# Patient Record
Sex: Female | Born: 1960 | Race: Black or African American | Hispanic: No | Marital: Single | Smoking: Never smoker
Health system: Southern US, Community
[De-identification: ages and names within clinical notes are randomized; demographics above are authoritative.]

## PROBLEM LIST (undated history)

## (undated) ENCOUNTER — Emergency Department (HOSPITAL_BASED_OUTPATIENT_CLINIC_OR_DEPARTMENT_OTHER): Payer: BC Managed Care – PPO | Source: Home / Self Care

## (undated) DIAGNOSIS — I10 Essential (primary) hypertension: Secondary | ICD-10-CM

## (undated) HISTORY — PX: ANKLE SURGERY: SHX546

## (undated) HISTORY — DX: Essential (primary) hypertension: I10

---

## 2000-03-19 ENCOUNTER — Other Ambulatory Visit: Admission: RE | Admit: 2000-03-19 | Discharge: 2000-03-19 | Payer: Self-pay | Admitting: *Deleted

## 2001-05-10 ENCOUNTER — Other Ambulatory Visit: Admission: RE | Admit: 2001-05-10 | Discharge: 2001-05-10 | Payer: Self-pay | Admitting: *Deleted

## 2001-12-22 ENCOUNTER — Ambulatory Visit (HOSPITAL_COMMUNITY): Admission: RE | Admit: 2001-12-22 | Discharge: 2001-12-22 | Payer: Self-pay | Admitting: Internal Medicine

## 2015-02-06 ENCOUNTER — Emergency Department (HOSPITAL_COMMUNITY)
Admission: EM | Admit: 2015-02-06 | Discharge: 2015-02-06 | Disposition: A | Discharge status: Home / Self Care | Payer: BC Managed Care – PPO | Attending: Emergency Medicine | Admitting: Emergency Medicine

## 2015-02-06 ENCOUNTER — Emergency Department (HOSPITAL_COMMUNITY): Payer: BC Managed Care – PPO

## 2015-02-06 ENCOUNTER — Encounter (HOSPITAL_COMMUNITY): Payer: Self-pay | Admitting: Emergency Medicine

## 2015-02-06 DIAGNOSIS — Y998 Other external cause status: Secondary | ICD-10-CM | POA: Diagnosis not present

## 2015-02-06 DIAGNOSIS — Y9301 Activity, walking, marching and hiking: Secondary | ICD-10-CM | POA: Insufficient documentation

## 2015-02-06 DIAGNOSIS — X58XXXA Exposure to other specified factors, initial encounter: Secondary | ICD-10-CM | POA: Insufficient documentation

## 2015-02-06 DIAGNOSIS — Y9389 Activity, other specified: Secondary | ICD-10-CM | POA: Insufficient documentation

## 2015-02-06 DIAGNOSIS — Y9289 Other specified places as the place of occurrence of the external cause: Secondary | ICD-10-CM | POA: Diagnosis not present

## 2015-02-06 DIAGNOSIS — S8261XA Displaced fracture of lateral malleolus of right fibula, initial encounter for closed fracture: Secondary | ICD-10-CM | POA: Diagnosis not present

## 2015-02-06 DIAGNOSIS — S99911A Unspecified injury of right ankle, initial encounter: Secondary | ICD-10-CM | POA: Diagnosis present

## 2015-02-06 DIAGNOSIS — S82891A Other fracture of right lower leg, initial encounter for closed fracture: Secondary | ICD-10-CM

## 2015-02-06 MED ORDER — MORPHINE SULFATE 4 MG/ML IJ SOLN
4.0000 mg | Freq: Once | INTRAMUSCULAR | Status: AC
  Administered 2015-02-06: 4 mg via INTRAMUSCULAR
  Filled 2015-02-06: qty 1

## 2015-02-06 MED ORDER — IBUPROFEN 600 MG PO TABS: 600.0000 mg | ORAL_TABLET | Freq: Four times a day (QID) | ORAL | Status: DC | PRN

## 2015-02-06 MED ORDER — HYDROCODONE-ACETAMINOPHEN 5-325 MG PO TABS: 1.0000 | ORAL_TABLET | ORAL | Status: DC | PRN

## 2015-02-06 MED ORDER — BACITRACIN ZINC 500 UNIT/GM EX OINT
TOPICAL_OINTMENT | CUTANEOUS | Status: AC
  Administered 2015-02-06: 19:00:00
  Filled 2015-02-06: qty 0.9

## 2015-02-06 NOTE — ED Notes (Signed)
Pt complaint of right ankle pain post fall; swelling/deformity noted; right pedal pulse present.

## 2015-02-06 NOTE — Progress Notes (Addendum)
54 yr old bcbs pt with right ankle injury while walking today Right ankle swollen with red streaks noted posteriorly No pcp noted Pt confirmed with ED CM that she does not have a pcp Pt had a visitor to come in when Cm was leaving pt room   Pt offered a list of pcps and orthopedics MDs within 5 mile radius of pt zip code to assist with pcp and orthopedic f/u   Pt awaiting her mother's arrival to assist with the name of the orthopedic provider her mother previously received services from

## 2015-02-06 NOTE — ED Notes (Signed)
Bed: VN50 Expected date:  Expected time:  Means of arrival:  Comments: Ankle deformity

## 2015-02-06 NOTE — ED Provider Notes (Signed)
CSN: [id number]     Arrival date & time 02/06/15  1615 History   First MD Initiated Contact with Patient 02/06/15 1621     Chief Complaint  Patient presents with  . Ankle Injury   HPI   54 year old female presents with right ankle pain. Patient reports that she was walking today when she felt a sharp pain in her right ankle, and was immediately unable to walk as she felt as if it was "out of place". Patient reports that she was not moving at high speeds, turning, no significant mechanism of injury. She reports that she has no prior history of injury to the ankle. She reports minimal pain at time of evaluation, he has been using ice, and cannot ambulate. Patient reports she is otherwise healthy with no concerns in addition to the ankle pain.   History reviewed. No pertinent past medical history. History reviewed. No pertinent past surgical history. No family history on file. History  Substance Use Topics  . Smoking status: Never Smoker   . Smokeless tobacco: Not on file  . Alcohol Use: No   OB History    No data available     Review of Systems  All other systems reviewed and are negative.   Allergies  Review of patient's allergies indicates no known allergies.  Home Medications   Prior to Admission medications   Not on File   BP 120/80 mmHg  Pulse 73  Temp(Src) 97.9 F (36.6 C) (Oral)  Resp 19  SpO2 99% Physical Exam  Constitutional: She is oriented to person, place, and time. She appears well-developed and well-nourished.  HENT:  Head: Normocephalic and atraumatic.  Eyes: Conjunctivae are normal. Pupils are equal, round, and reactive to light. Right eye exhibits no discharge. Left eye exhibits no discharge. No scleral icterus.  Neck: Normal range of motion. No JVD present. No tracheal deviation present.  Cardiovascular: Normal rate and regular rhythm.   Pulmonary/Chest: Effort normal. No stridor.  Musculoskeletal:  No pain to the palpation of the knee, proximal  fibula. Patient has pain to palpation of medial malleolus/ankle joint with obvious laxity, no skin tinting or soft tissue damage. Hematoma to the ankle. Foot is nontender to palpation, sensation intact, distal pulse Refill normal. Limited dorsiflexion of the ankle.  Neurological: She is alert and oriented to person, place, and time. Coordination normal.  Psychiatric: She has a normal mood and affect. Her behavior is normal. Judgment and thought content normal.  Nursing note and vitals reviewed.   ED Course  Procedures (including critical care time) Labs Review Labs Reviewed - No data to display  Imaging Review Dg Ankle Complete Right  02/06/2015   CLINICAL DATA:  Felipa Evener and felt a snap in her RIGHT ankle while she was walking downtown, did not fall, swelling, ankle injury  EXAM: RIGHT ANKLE - COMPLETE 3+ VIEW  COMPARISON:  None  FINDINGS: Bones demineralized.  Transverse comminuted fracture the medial malleolus, displaced laterally.  Oblique distal RIGHT fibular metadiaphyseal fracture displaced posteriorly and laterally.  Mild lateral subluxation of talus at tibiotalar joint.  Small bone fragment is seen on the lateral view superimposed with the posterior margin of the distal tibia, cannot exclude posterior tibial fracture.  Tarsals intact.  Regional soft tissue swelling.  IMPRESSION: Displaced RIGHT medial and lateral malleolar fractures as above.  Questionable fracture of the distal posterior tibia.   Electronically Signed   By: Lavonia Dana M.D.   On: 02/06/2015 17:44     EKG Interpretation None  MDM   Final diagnoses:  Ankle fracture, right, closed, initial encounter   Labs: None indicated  Imaging: DG ankle right complete displaced right medial and lateral malleolus fracture  Consults: Orthopedics Dr. Rhona Raider  Therapeutics: Morphine  Assessment/Plan: Patient presents with a place right medial and lateral malleoli fracture with question fracture of the distal posterior tibia.  Patient was in minimal pain, at rest but required morphine for splint placement . Dr. Milas Hock was consult and who instructed [place] to apply a splint with follow-up tomorrow morning. Patient was informed that she needed to follow-up for possible surgical management. She is instructed to not eat anything after midnight in the event surgery be performed tomorrow. Patient was given return precautions and the event that she experienced worsening signs or symptoms including compartment syndrome, these were discussed in detail with her. Patient's mother was present at the time of evaluation she was given the same precautions and follow-up information. Patient had no further questions at the time of discharge. She was instructed to use crutches avoid walking point weight on the affected ankle. She is advised to use ibuprofen, and Vicodin as needed for pain. Patient was discharged home without difficulty.        Okey Regal, PA-C 02/07/15 0214  Lacretia Leigh, MD 02/11/15 [phone]

## 2015-02-06 NOTE — Discharge Instructions (Signed)
Ankle Fracture A fracture is a break in a bone. A cast or splint may be used to protect the ankle and heal the break. Sometimes, surgery is needed. HOME CARE  Use crutches as told by your doctor. It is very important that you use your crutches correctly.  Do not put weight or pressure on the injured ankle until told by your doctor.  Keep your ankle raised (elevated) when sitting or lying down.  Apply ice to the ankle:  Put ice in a plastic bag.  Place a towel between your cast and the bag.  Leave the ice on for 20 minutes, 2-3 times a day.  If you have a plaster or fiberglass cast:  Do not try to scratch under the cast with any objects.  Check the skin around the cast every day. You may put lotion on red or sore areas.  Keep your cast dry and clean.  If you have a plaster splint:  Wear the splint as told by your doctor.  You can loosen the elastic around the splint if your toes get numb, tingle, or turn cold or blue.  Do not put pressure on any part of your cast or splint. It may break. Rest your plaster splint or cast only on a pillow the first 24 hours until it is fully hardened.  Cover your cast or splint with a plastic bag during showers.  Do not lower your cast or splint into water.  Take medicine as told by your doctor.  Do not drive until your doctor says it is safe.  Follow-up with your doctor as told. It is very important that you go to your follow-up visits. GET HELP IF: The swelling and discomfort gets worse.  GET HELP RIGHT AWAY IF:   Your splint or cast breaks.  You continue to have very bad pain.  You have new pain or swelling after your splint or cast was put on.  Your skin or toes below the injured ankle:  Turn blue or gray.  Feel cold, numb, or you cannot feel them.  There is a bad smell or yellowish white fluid (pus) coming from under the splint or cast. MAKE SURE YOU:   Understand these instructions.  Will watch your  condition.  Will get help right away if you are not doing well or get worse. Document Released: 07/13/2009 Document Revised: 07/06/2013 Document Reviewed: 04/14/2013 [facility] Patient Information 2015 [place], [place]. This information is not intended to replace advice given to you by your health care provider. Make sure you discuss any questions you have with your health care provider.  Please keep extremity elevated, use ibuprofen as needed for pain, hydrocodone for breakthrough pain. Do not walk on your ankle. Please follow-up tomorrow morning with orthopedic surgeon for further evaluation and management. The emergency room if you experience excruciating pain, loss of distal sensation strength function, changes in perfusion of your toes.

## 2015-02-09 ENCOUNTER — Other Ambulatory Visit: Payer: Self-pay | Admitting: Orthopaedic Surgery

## 2015-02-13 ENCOUNTER — Ambulatory Visit (HOSPITAL_COMMUNITY)
Admission: RE | Admit: 2015-02-13 | Payer: BC Managed Care – PPO | Source: Ambulatory Visit | Admitting: Orthopaedic Surgery

## 2015-02-13 ENCOUNTER — Encounter (HOSPITAL_COMMUNITY): Admission: RE | Payer: Self-pay | Source: Ambulatory Visit

## 2015-02-13 SURGERY — OPEN REDUCTION INTERNAL FIXATION (ORIF) ANKLE FRACTURE
Anesthesia: General | Laterality: Right

## 2015-05-18 ENCOUNTER — Ambulatory Visit: Payer: BC Managed Care – PPO | Attending: Orthopaedic Surgery | Admitting: Rehabilitation

## 2015-05-18 DIAGNOSIS — M2141 Flat foot [pes planus] (acquired), right foot: Secondary | ICD-10-CM | POA: Diagnosis present

## 2015-05-18 DIAGNOSIS — M25674 Stiffness of right foot, not elsewhere classified: Secondary | ICD-10-CM | POA: Insufficient documentation

## 2015-05-18 DIAGNOSIS — R29898 Other symptoms and signs involving the musculoskeletal system: Secondary | ICD-10-CM

## 2015-05-18 DIAGNOSIS — R269 Unspecified abnormalities of gait and mobility: Secondary | ICD-10-CM | POA: Insufficient documentation

## 2015-05-18 DIAGNOSIS — M25571 Pain in right ankle and joints of right foot: Secondary | ICD-10-CM | POA: Diagnosis present

## 2015-05-18 NOTE — Therapy (Signed)
[address] [place], [place], [address] Phone: [phone]   Fax:  [phone]  Physical Therapy Evaluation  Patient Details  Name: [name] MRN: [id number] Date of Birth: Apr 24, 1961 Referring Provider:  Marybelle Killings, MD  Encounter Date: 05/18/2015      PT End of Session - 05/18/15 1232    Visit Number 1   PT Start Time 1110   PT Stop Time 1210   PT Time Calculation (min) 60 min      No past medical history on file.  No past surgical history on file.  There were no vitals filed for this visit.  Visit Diagnosis:  Decreased range of motion of foot, right - Plan: PT plan of care cert/re-cert  Weakness of foot, right - Plan: PT plan of care cert/re-cert  Abnormality of gait - Plan: PT plan of care cert/re-cert  Pain in joint, ankle and foot, right - Plan: PT plan of care cert/re-cert      Subjective Assessment - 05/18/15 1115    Subjective R bimalleolar fx 02/06/15.    Patient does not know of any mechanism of injury.   Denies falling.  States she was going to sit down and noticed her ankle was malformed.   Had surgical fixation.   Fu with MD last week and walking boot was d/c'd.   She returns to MD 9/23.    She is still ambulating with crutches and has done so since sx.       Patient Stated Goals walk normally without pain   Currently in Pain? Yes   Pain Score 6    Pain Location Ankle   Pain Orientation Right   Pain Descriptors / Indicators Other (Comment);Aching   Pain Type Surgical pain   Pain Onset More than a month ago   Pain Frequency Constant   Aggravating Factors  WB and stretching   Pain Relieving Factors sitting and NWB positions   Effect of Pain on Daily Activities unable to do most housework due to pain;  retired,  very active in community volunteering that she is unable to do at this time            [facility] PT Assessment - 05/18/15 0001    Assessment   Medical Diagnosis  bi malleolar ankle Fx RLE   Onset Date/Surgical Date 02/18/15   Hand Dominance Right   Next MD Visit 9/23   Prior Therapy no   Precautions   Precautions None   Restrictions   Weight Bearing Restrictions No   Balance Screen   Has the patient fallen in the past 6 months No   Has the patient had a decrease in activity level because of a fear of falling?  No   Is the patient reluctant to leave their home because of a fear of falling?  No   Home Environment   Additional Comments 2 story home with bedroom upstairs   Prior Function   Level of Independence Independent   ROM / Strength   AROM / PROM / Strength PROM;Strength   AROM   Right Ankle Dorsiflexion -20   Right Ankle Plantar Flexion 25   Right Ankle Inversion 8   Right Ankle Eversion 2   PROM   PROM Assessment Site Ankle   Right/Left Ankle Right   Strength   Strength Assessment Site Ankle   Right/Left Ankle Right   Right Ankle Dorsiflexion 3/5   Right Ankle Plantar Flexion  2/5   Right Ankle Inversion 2/5   Right Ankle Eversion 2/5   Ambulation/Gait   Ambulation/Gait Yes   Ambulation/Gait Assistance 7: Independent   Ambulation Distance (Feet) 75 Feet  250 feet   Assistive device Crutches                   [facility] Adult PT Treatment/Exercise - 05/18/15 0001    Exercises   Exercises Ankle   Modalities   Modalities Cryotherapy;Electrical Stimulation   Cryotherapy   Number Minutes Cryotherapy 15 Minutes   Cryotherapy Location Ankle   Type of Cryotherapy Ice pack   Electrical Stimulation   Electrical Stimulation Location R ankle   Electrical Stimulation Action premod to tolerance   Electrical Stimulation Goals Pain   Ankle Exercises: Aerobic   Elliptical Nustep x 6' L5                PT Education - 05/18/15 1229    Education provided Yes   Education Details towel DF stretch, towel DF with inversion twist stretch and eversion twist stretch;   seated cross legged manual PF self stretch    Person(s) Educated Patient   Methods Explanation;Demonstration;Tactile cues;Verbal cues   Comprehension Need further instruction          PT Short Term Goals - 05/18/15 1237    PT SHORT TERM GOAL #1   Title independent HEP   Time 4   Period Weeks   Status New   PT SHORT TERM GOAL #2   Title 2/10 pain worst   Time 4   Period Weeks   PT SHORT TERM GOAL #3   Title Gait with 1 crutch or cane independently to 400'   Time 4   Period Weeks   Status New           PT Long Term Goals - 05/18/15 1238    PT LONG TERM GOAL #1   Title 0/10 pain most of time   Time 8   Period Weeks   Status New   PT LONG TERM GOAL #2   Title R ankle DF to neutral, 20 deg INV, 10 deg EV   Time 8   Period Weeks   Status New   PT LONG TERM GOAL #3   Title Gait without device with normal gait pattern, no limp with good balance up to 500' and up/down stairs with reciprocal gait pattern   Time 8   Period Weeks   Status New               Plan - 05/18/15 1232    Clinical Impression Statement Pt. 3 months s/p bimalleolar ankle fixation with very restricted ROM, difficulty walking due to pain and stiffness R ankle.    Has fully healed incisions medial and lateral R ankle with keloid scar laterally.    Considerable edema throughout the foot and ankle as well.  Dependent on bilateral crutches with gait   Pt will benefit from skilled therapeutic intervention in order to improve on the following deficits Abnormal gait;Decreased range of motion;Decreased mobility;Difficulty walking;Increased edema;Impaired flexibility;Decreased strength;Pain   Rehab Potential Excellent   PT Frequency 3x / week   PT Duration 8 weeks   PT Treatment/Interventions Moist Heat;Ultrasound;Electrical Stimulation;Cryotherapy;Gait training;DME Instruction;Stair training;Functional mobility training;Therapeutic activities;Therapeutic exercise;Balance training;Patient/family education;Scar mobilization;Passive range of  motion;Manual techniques;Taping   PT Next Visit Plan nustep warm up;   review HEP stretching; do some manual stretching and add exercise as able;    ice/IFC;  scar tissue mob   Consulted and Agree with Plan of Care Patient         Problem List There are no active problems to display for this patient.   Volney American, PT 05/18/2015, 12:44 PM  [address] [place], [place], [address] Phone: [phone]   Fax:  [phone]

## 2015-05-22 ENCOUNTER — Ambulatory Visit: Payer: BC Managed Care – PPO | Admitting: Physical Therapy

## 2015-05-22 ENCOUNTER — Encounter: Payer: Self-pay | Admitting: Physical Therapy

## 2015-05-22 DIAGNOSIS — R29898 Other symptoms and signs involving the musculoskeletal system: Secondary | ICD-10-CM

## 2015-05-22 DIAGNOSIS — M25674 Stiffness of right foot, not elsewhere classified: Secondary | ICD-10-CM

## 2015-05-22 DIAGNOSIS — M25571 Pain in right ankle and joints of right foot: Secondary | ICD-10-CM

## 2015-05-22 DIAGNOSIS — R269 Unspecified abnormalities of gait and mobility: Secondary | ICD-10-CM

## 2015-05-22 NOTE — Therapy (Signed)
[address] [place], [place], [address] Phone: [phone]   Fax:  [phone]  Physical Therapy Treatment  Patient Details  Name: [name] MRN: [id number] Date of Birth: 05/29/1961 Referring Provider:  Marybelle Killings, MD  Encounter Date: 05/22/2015      PT End of Session - 05/22/15 1430    Visit Number 2   Date for PT Re-Evaluation 07/17/15   PT Start Time 6237   PT Stop Time 1438   PT Time Calculation (min) 56 min   Activity Tolerance Patient tolerated treatment well;Patient limited by fatigue;Patient limited by pain   Behavior During Therapy Anxious      History reviewed. No pertinent past medical history.  History reviewed. No pertinent past surgical history.  There were no vitals filed for this visit.  Visit Diagnosis:  Decreased range of motion of foot, right  Weakness of foot, right  Abnormality of gait  Pain in joint, ankle and foot, right      Subjective Assessment - 05/22/15 1344    Subjective Pt reports no new changes since last treatment. Pt also reports compliance with HEP    Patient Stated Goals walk normally without pain   Currently in Pain? Yes   Pain Score 4    Pain Location Ankle   Pain Orientation Right   Pain Descriptors / Indicators Aching   Pain Type Surgical pain                         [facility] Adult PT Treatment/Exercise - 05/22/15 0001    Ambulation/Gait   Ambulation/Gait Yes   Ambulation Distance (Feet) 20 Feet   Assistive device L Axillary Crutch   Gait Pattern Decreased arm swing - right;Decreased arm swing - left;Decreased step length - right;Decreased step length - left;Decreased stance time - right;Step-to pattern   Gait Comments X4 constant cues to put weight on RLE   Exercises   Exercises Ankle, Towel DF stretch 10    Modalities   Modalities Cryotherapy   Cryotherapy   Number Minutes Cryotherapy 10 Minutes   Cryotherapy  Location Ankle   Type of Cryotherapy Ice pack   Manual Therapy   Manual Therapy Passive ROM   Passive ROM R ankle dorsi and planter flexion, eversion,  inversion   Ankle Exercises: Aerobic   Elliptical Nustep x 7' L4   Ankle Exercises: Stretches   Gastroc Stretch 5 reps;20 seconds   Ankle Exercises: Standing   Other Standing Ankle Exercises standing weight shifts 20 reps, pt hesitant max encouragement                   PT Short Term Goals - 05/22/15 1435    PT SHORT TERM GOAL #1   Title independent HEP   Status Partially Met   PT SHORT TERM GOAL #2   Title 2/10 pain worst   Status On-going   PT SHORT TERM GOAL #3   Title Gait with 1 crutch or cane independently to 400'   Status On-going           PT Long Term Goals - 05/18/15 1238    PT LONG TERM GOAL #1   Title 0/10 pain most of time   Time 8   Period Weeks   Status New   PT LONG TERM GOAL #2   Title R ankle DF to neutral, 20 deg INV, 10 deg EV   Time 8  Period Weeks   Status New   PT LONG TERM GOAL #3   Title Gait without device with normal gait pattern, no limp with good balance up to 500' and up/down stairs with reciprocal gait pattern   Time 8   Period Weeks   Status New               Plan - 05/22/15 1431    Clinical Impression Statement Pt L ankle is continues to be very restricted with ROM. Pt resisted to put weight on RLE throughout treatment. Pt requires max encouragement to put bare weight on R ankle. Pt reports that she does not know why she cant put weight on her leg because it does not hurt much. Pt tends to walk with RLE externally rotated and out to the side to keep from putting weight on it.    Pt will benefit from skilled therapeutic intervention in order to improve on the following deficits Abnormal gait;Decreased range of motion;Decreased mobility;Difficulty walking;Increased edema;Impaired flexibility;Decreased strength;Pain   Rehab Potential Good   PT Frequency 3x / week    PT Duration 8 weeks   PT Treatment/Interventions Moist Heat;Ultrasound;Electrical Stimulation;Cryotherapy;Gait training;DME Instruction;Stair training;Functional mobility training;Therapeutic activities;Therapeutic exercise;Balance training;Patient/family education;Scar mobilization;Passive range of motion;Manual techniques;Taping   PT Next Visit Plan Gait and ROM        Problem List There are no active problems to display for this patient.   Scot Jun, PTA  05/22/2015, 2:41 PM  [address] [place], [place], [address] Phone: [phone]   Fax:  [phone]

## 2015-05-23 ENCOUNTER — Encounter: Payer: Self-pay | Admitting: Physical Therapy

## 2015-05-23 ENCOUNTER — Ambulatory Visit: Payer: BC Managed Care – PPO | Admitting: Physical Therapy

## 2015-05-23 DIAGNOSIS — M25674 Stiffness of right foot, not elsewhere classified: Secondary | ICD-10-CM | POA: Diagnosis not present

## 2015-05-23 DIAGNOSIS — R269 Unspecified abnormalities of gait and mobility: Secondary | ICD-10-CM

## 2015-05-23 DIAGNOSIS — M25571 Pain in right ankle and joints of right foot: Secondary | ICD-10-CM

## 2015-05-23 DIAGNOSIS — R29898 Other symptoms and signs involving the musculoskeletal system: Secondary | ICD-10-CM

## 2015-05-23 NOTE — Therapy (Signed)
[place] [place], [place], [address] Phone: [phone]   Fax:  [phone]  Physical Therapy Treatment  Patient Details  Name: Navayah Sok MRN: [id number] Date of Birth: [birth year] Referring Provider:  Marybelle Killings, MD  Encounter Date: 05/23/2015      PT End of Session - 05/23/15 1149    Visit Number 2   Date for PT Re-Evaluation 07/17/15   PT Start Time 1100   PT Stop Time 1153   PT Time Calculation (min) 53 min   Activity Tolerance Patient tolerated treatment well;Patient limited by pain   Behavior During Therapy Anxious      History reviewed. No pertinent past medical history.  History reviewed. No pertinent past surgical history.  There were no vitals filed for this visit.  Visit Diagnosis:  Abnormality of gait  Weakness of foot, right  Decreased range of motion of foot, right  Pain in joint, ankle and foot, right      Subjective Assessment - 05/23/15 1103    Subjective Pt entered clinics ambulating with bilat crutches, bearing more weight in RLE.   Currently in Pain? No/denies   Pain Score 0-No pain   Pain Location Ankle   Pain Orientation Right                         [facility] Adult PT Treatment/Exercise - 05/23/15 0001    Ambulation/Gait   Ambulation/Gait Yes   Ambulation Distance (Feet) 20 Feet   Assistive device L Axillary Crutch   Gait Pattern Decreased arm swing - right;Decreased arm swing - left;Decreased step length - right;Decreased step length - left;Decreased stance time - right;Step-to pattern   Gait Comments X3 constant cues to put weight on RLE   Exercises   Exercises Ankle   Modalities   Modalities Cryotherapy   Cryotherapy   Number Minutes Cryotherapy 10 Minutes   Cryotherapy Location Ankle   Type of Cryotherapy Ice pack   Manual Therapy   Manual Therapy Passive ROM  incision  mobilization   Passive ROM R ankle dorsi and plantar  flexion, eversion,  inversion   Ankle Exercises: Aerobic   Elliptical Nustep x 7' L4   Ankle Exercises: Standing   Other Standing Ankle Exercises weights shift 50, cues to shift hips    Other Standing Ankle Exercises SLS RLE, 10 reps 5 seconds                 PT Education - 05/23/15 1149    Education provided Yes   Education Details scar mobilization   Person(s) Educated Patient   Methods Explanation;Demonstration;Tactile cues   Comprehension Verbalized understanding;Returned demonstration          PT Short Term Goals - 05/23/15 1156    PT SHORT TERM GOAL #1   Title independent HEP   Status Achieved   PT SHORT TERM GOAL #3   Title Gait with 1 crutch or cane independently to 400'   Status On-going           PT Long Term Goals - 05/18/15 1238    PT LONG TERM GOAL #1   Title 0/10 pain most of time   Time 8   Period Weeks   Status New   PT LONG TERM GOAL #2   Title R ankle DF to neutral, 20 deg INV, 10 deg EV   Time 8   Period Weeks   Status New  PT LONG TERM GOAL #3   Title Gait without device with normal gait pattern, no limp with good balance up to 500' and up/down stairs with reciprocal gait pattern   Time 8   Period Weeks   Status New               Plan - 05/23/15 1150    Clinical Impression Statement Restricted R ankle ROM. Pt requires constant cues with weight shift to her R side. Pt very guarded to bare weight on R ankle. Ample amount of time spent explaining gait cycle to pt with crutch.    Pt will benefit from skilled therapeutic intervention in order to improve on the following deficits Abnormal gait;Decreased range of motion;Decreased mobility;Difficulty walking;Increased edema;Impaired flexibility;Decreased strength;Pain   Rehab Potential Fair   PT Frequency 3x / week   PT Duration 8 weeks   PT Treatment/Interventions Moist Heat;Ultrasound;Electrical Stimulation;Cryotherapy;Gait training;DME Instruction;Stair training;Functional  mobility training;Therapeutic activities;Therapeutic exercise;Balance training;Patient/family education;Scar mobilization;Passive range of motion;Manual techniques;Taping   PT Next Visit Plan Gait and ROM        Problem List There are no active problems to display for this patient.   Scot Jun, PTA 05/23/2015, 11:58 AM  [place] [address] [place], [place], [address] Phone: [phone]   Fax:  [phone]

## 2015-05-25 ENCOUNTER — Ambulatory Visit: Payer: BC Managed Care – PPO | Admitting: Physical Therapy

## 2015-05-25 ENCOUNTER — Encounter: Payer: Self-pay | Admitting: Physical Therapy

## 2015-05-25 DIAGNOSIS — M25571 Pain in right ankle and joints of right foot: Secondary | ICD-10-CM

## 2015-05-25 DIAGNOSIS — M25674 Stiffness of right foot, not elsewhere classified: Secondary | ICD-10-CM

## 2015-05-25 DIAGNOSIS — R269 Unspecified abnormalities of gait and mobility: Secondary | ICD-10-CM

## 2015-05-25 DIAGNOSIS — R29898 Other symptoms and signs involving the musculoskeletal system: Secondary | ICD-10-CM

## 2015-05-25 NOTE — Therapy (Signed)
[place], [place], [address] Phone: [phone]   Fax:  [phone]  Physical Therapy Treatment  Patient Details  Name: [name] MRN: [id number] Date of Birth: [birth year] Referring Provider:  Marybelle Killings, MD  Encounter Date: 05/25/2015      PT End of Session - 05/25/15 1155    Visit Number 3   Date for PT Re-Evaluation 07/17/15   PT Start Time 1112   PT Stop Time 1156   PT Time Calculation (min) 44 min   Activity Tolerance Patient tolerated treatment well   Behavior During Therapy [facility] for tasks assessed/performed;Anxious      History reviewed. No pertinent past medical history.  History reviewed. No pertinent past surgical history.  There were no vitals filed for this visit.  Visit Diagnosis:  Abnormality of gait  Weakness of foot, right  Decreased range of motion of foot, right  Pain in joint, ankle and foot, right                       [facility] Adult PT Treatment/Exercise - 05/25/15 0001    Ambulation/Gait   Ambulation Distance (Feet) 70 Feet   Assistive device L Axillary Crutch   Gait Comments did the above multiple times 12 times, a lot of cues verbal, visual and tactile   Manual Therapy   Manual Therapy Passive ROM   Passive ROM R ankle dorsi and plantar flexion, eversion,  inversion   Ankle Exercises: Aerobic   Elliptical Elliptical R=6 I = 8 x 5 minutes   Tread Mill 1.3 mph x 4 minutes working on weight shift, again a lot of cues are required   Ankle Exercises: Stretches   Soleus Stretch 20 seconds;5 reps   Gastroc Stretch 5 reps;20 seconds   Ankle Exercises: Standing   BAPS 15 reps  used sit fit   Other Standing Ankle Exercises weights shifte 50, cues to shift hips    Other Standing Ankle Exercises SLS RLE, 10 reps 5 seconds   worked on pre gait weight shifting                  PT Short Term Goals - 05/23/15 1156    PT SHORT TERM  GOAL #1   Title independent HEP   Status Achieved   PT SHORT TERM GOAL #3   Title Gait with 1 crutch or cane independently to 400'   Status On-going           PT Long Term Goals - 05/18/15 1238    PT LONG TERM GOAL #1   Title 0/10 pain most of time   Time 8   Period Weeks   Status New   PT LONG TERM GOAL #2   Title R ankle DF to neutral, 20 deg INV, 10 deg EV   Time 8   Period Weeks   Status New   PT LONG TERM GOAL #3   Title Gait without device with normal gait pattern, no limp with good balance up to 500' and up/down stairs with reciprocal gait pattern   Time 8   Period Weeks   Status New               Plan - 05/25/15 1156    Clinical Impression Statement Tremendously poor gait, needs multiple and constant cues, verbal, tactile and visual to help with all of the compensation that she is doing.   PT  Next Visit Plan Gait and ROM   Consulted and Agree with Plan of Care Patient        Problem List There are no active problems to display for this patient.   Sumner Boast., PT 05/25/2015, 11:58 AM  [address] [place], [place], [address] Phone: [phone]   Fax:  [phone]

## 2015-05-28 ENCOUNTER — Ambulatory Visit: Payer: BC Managed Care – PPO | Admitting: Physical Therapy

## 2015-05-28 ENCOUNTER — Encounter: Payer: Self-pay | Admitting: Physical Therapy

## 2015-05-28 DIAGNOSIS — M25571 Pain in right ankle and joints of right foot: Secondary | ICD-10-CM

## 2015-05-28 DIAGNOSIS — M25674 Stiffness of right foot, not elsewhere classified: Secondary | ICD-10-CM

## 2015-05-28 DIAGNOSIS — R29898 Other symptoms and signs involving the musculoskeletal system: Secondary | ICD-10-CM

## 2015-05-28 NOTE — Therapy (Signed)
[place], [place], [address] Phone: [phone]   Fax:  [phone]  Physical Therapy Treatment  Patient Details  Name: [name] MRN: [id number] Date of Birth: 15-Feb-1961 Referring Provider:  Marybelle Killings, MD  Encounter Date: 05/28/2015      PT End of Session - 05/28/15 1433    Visit Number 4   PT Start Time 8938   PT Stop Time 1441   PT Time Calculation (min) 53 min   Activity Tolerance Patient tolerated treatment well      History reviewed. No pertinent past medical history.  History reviewed. No pertinent past surgical history.  There were no vitals filed for this visit.  Visit Diagnosis:  Weakness of foot, right  Decreased range of motion of foot, right  Pain in joint, ankle and foot, right      Subjective Assessment - 05/28/15 1350    Subjective Pt enters clinic with [place]. Pt reports that everything has been fine     Currently in Pain? Yes   Pain Score 3    Pain Location Ankle   Pain Orientation Right   Pain Descriptors / Indicators Aching                         [facility] Adult PT Treatment/Exercise - 05/28/15 0001    Ambulation/Gait   Ambulation Distance (Feet) 70 Feet   Assistive device Straight cane   Gait Comments did the above multiple times , a lot of cues verbal, visual and tactile   Cryotherapy   Number Minutes Cryotherapy 10 Minutes   Cryotherapy Location Ankle   Type of Cryotherapy Ice pack   Manual Therapy   Manual Therapy Passive ROM   Passive ROM R ankle dorsi and plantar flexion, eversion,  inversion   Ankle Exercises: Aerobic   Elliptical Elliptical R=3 I = 6 x 5 minutes   Ankle Exercises: Standing   BAPS 15 reps  sit fit    Other Standing Ankle Exercises weights shift 50, cues to shift hips    Other Standing Ankle Exercises SLS RLE, 10 reps 5 seconds   worked on pre gait weight shifting                  PT Short  Term Goals - 05/28/15 1436    PT SHORT TERM GOAL #3   Title Gait with 1 crutch or cane independently to 400'   Status Achieved           PT Long Term Goals - 05/28/15 1436    PT LONG TERM GOAL #1   Title 0/10 pain most of time   Status On-going               Plan - 05/28/15 1434    Clinical Impression Statement Pt requires constant cues to correct gait pattern. Pt still uses compensatory swelling not to bear weight on RLE. Pt requires facilitation to bear weight on RLE, but resist at times    Pt will benefit from skilled therapeutic intervention in order to improve on the following deficits Abnormal gait;Decreased range of motion;Decreased mobility;Difficulty walking;Increased edema;Impaired flexibility;Decreased strength;Pain   Rehab Potential Fair   PT Frequency 3x / week   PT Duration 8 weeks   PT Treatment/Interventions Moist Heat;Ultrasound;Electrical Stimulation;Cryotherapy;Gait training;DME Instruction;Stair training;Functional mobility training;Therapeutic activities;Therapeutic exercise;Balance training;Patient/family education;Scar mobilization;Passive range of motion;Manual techniques;Taping   PT Next Visit Plan Gait and ROM  Problem List There are no active problems to display for this patient.   Scot Jun, PTA  05/28/2015, 2:40 PM  [address] [place], [place], [address] Phone: [phone]   Fax:  [phone]

## 2015-05-30 ENCOUNTER — Ambulatory Visit: Payer: BC Managed Care – PPO | Admitting: Physical Therapy

## 2015-05-31 ENCOUNTER — Encounter: Payer: Self-pay | Admitting: Physical Therapy

## 2015-05-31 ENCOUNTER — Ambulatory Visit: Payer: BC Managed Care – PPO | Attending: Orthopaedic Surgery | Admitting: Physical Therapy

## 2015-05-31 DIAGNOSIS — M2141 Flat foot [pes planus] (acquired), right foot: Secondary | ICD-10-CM | POA: Insufficient documentation

## 2015-05-31 DIAGNOSIS — R269 Unspecified abnormalities of gait and mobility: Secondary | ICD-10-CM | POA: Insufficient documentation

## 2015-05-31 DIAGNOSIS — M25674 Stiffness of right foot, not elsewhere classified: Secondary | ICD-10-CM | POA: Diagnosis present

## 2015-05-31 DIAGNOSIS — R29898 Other symptoms and signs involving the musculoskeletal system: Secondary | ICD-10-CM

## 2015-05-31 DIAGNOSIS — M25571 Pain in right ankle and joints of right foot: Secondary | ICD-10-CM | POA: Diagnosis present

## 2015-05-31 NOTE — Therapy (Signed)
[place], [place], [address] Phone: [phone]   Fax:  [phone]  Physical Therapy Treatment  Patient Details  Name: [name] MRN: [id number] Date of Birth: 01/10/1961 Referring Provider:  Marybelle Killings, MD  Encounter Date: 05/31/2015      PT End of Session - 05/31/15 1437    Visit Number 5   Date for PT Re-Evaluation 07/17/15   PT Start Time 7062   PT Stop Time 1442   PT Time Calculation (min) 53 min   Equipment Utilized During Treatment Other (comment)   Activity Tolerance Patient tolerated treatment well   Behavior During Therapy [facility] for tasks assessed/performed;Anxious      History reviewed. No pertinent past medical history.  History reviewed. No pertinent past surgical history.  There were no vitals filed for this visit.  Visit Diagnosis:  Weakness of foot, right  Decreased range of motion of foot, right  Pain in joint, ankle and foot, right      Subjective Assessment - 05/31/15 1349    Subjective Pt reports 'things are going good,  but Im still walking choppy"   Currently in Pain? No/denies   Pain Score 0-No pain   Pain Location Ankle   Pain Orientation Right                         [facility] Adult PT Treatment/Exercise - 05/31/15 0001    Ambulation/Gait   Ambulation/Gait Yes   Ambulation Distance (Feet) 70 Feet   Assistive device Rollator;None   Gait Pattern Decreased stride length;Decreased step length - left;Decreased stance time - right   Stairs Yes   Stairs Assistance 4: Min guard   Stair Management Technique One rail Right;One rail Left;Two rails;Alternating pattern;Step to pattern;Forwards   Number of Stairs 4   Height of Stairs 6   Gait Comments did the above multiple times , a lot of cues verbal, visual and tactile   Exercises   Exercises Ankle   Modalities   Modalities Cryotherapy   Cryotherapy   Number Minutes Cryotherapy 10  Minutes   Cryotherapy Location Ankle   Type of Cryotherapy Ice pack   Manual Therapy   Manual Therapy Passive ROM   Passive ROM R ankle dorsi and plantar flexion, eversion,  inversion   Ankle Exercises: Aerobic   Elliptical Elliptical R=5 I = 10 x 5 minutes   Tread Mill 1.0 mph x 3 minutes working on weight shift, again a lot of cues are required   Ankle Exercises: Standing   BAPS 15 reps   Vector Stance --  on sit fit   Other Standing Ankle Exercises weights shift 50, cues to shift hips    Other Standing Ankle Exercises SLS RLE, 10 seconds 3 reps  worked on pre gait weight shifting   Ankle Exercises: Stretches   Soleus Stretch 20 seconds;5 reps   Gastroc Stretch 5 reps;20 seconds                  PT Short Term Goals - 05/28/15 1436    PT SHORT TERM GOAL #3   Title Gait with 1 crutch or cane independently to 400'   Status Achieved           PT Long Term Goals - 05/31/15 1441    PT LONG TERM GOAL #3   Title Gait without device with normal gait pattern, no limp with good balance up  to 500' and up/down stairs with reciprocal gait pattern   Status On-going               Plan - 05/31/15 1438    Clinical Impression Statement Pt continues to have restricted ankle motion with manual therapy that affects gait. Pt bears weight better with less cues, and swelling has decreased a some.    Pt will benefit from skilled therapeutic intervention in order to improve on the following deficits Abnormal gait;Decreased range of motion;Decreased mobility;Difficulty walking;Increased edema;Impaired flexibility;Decreased strength;Pain   Rehab Potential Fair   PT Frequency 3x / week   PT Duration 8 weeks   PT Treatment/Interventions Moist Heat;Ultrasound;Electrical Stimulation;Cryotherapy;Gait training;DME Instruction;Stair training;Functional mobility training;Therapeutic activities;Therapeutic exercise;Balance training;Patient/family education;Scar mobilization;Passive range of  motion;Manual techniques;Taping   PT Next Visit Plan Gait and ROM        Problem List There are no active problems to display for this patient.   Scot Jun, PTA  05/31/2015, 2:50 PM  [place] [address] [place], [place], [address] Phone: [phone]   Fax:  [phone]

## 2015-06-05 ENCOUNTER — Encounter: Payer: BC Managed Care – PPO | Admitting: Physical Therapy

## 2015-06-06 ENCOUNTER — Ambulatory Visit: Payer: BC Managed Care – PPO | Admitting: Physical Therapy

## 2015-06-06 ENCOUNTER — Encounter: Payer: Self-pay | Admitting: Physical Therapy

## 2015-06-06 DIAGNOSIS — M2141 Flat foot [pes planus] (acquired), right foot: Secondary | ICD-10-CM | POA: Diagnosis not present

## 2015-06-06 DIAGNOSIS — R269 Unspecified abnormalities of gait and mobility: Secondary | ICD-10-CM

## 2015-06-06 DIAGNOSIS — R29898 Other symptoms and signs involving the musculoskeletal system: Secondary | ICD-10-CM

## 2015-06-06 DIAGNOSIS — M25674 Stiffness of right foot, not elsewhere classified: Secondary | ICD-10-CM

## 2015-06-06 DIAGNOSIS — M25571 Pain in right ankle and joints of right foot: Secondary | ICD-10-CM

## 2015-06-06 NOTE — Therapy (Signed)
[place], [place], [address] Phone: [phone]   Fax:  [phone]  Physical Therapy Treatment  Patient Details  Name: [name] MRN: [id number] Date of Birth: 1960-11-02 Referring Provider:  Marybelle Killings, MD  Encounter Date: 06/06/2015      PT End of Session - 06/06/15 1146    Visit Number 6   Date for PT Re-Evaluation 07/17/15   PT Start Time 6948   PT Stop Time 1053   PT Time Calculation (min) 60 min   Activity Tolerance Patient tolerated treatment well   Behavior During Therapy [facility] for tasks assessed/performed;Anxious      History reviewed. No pertinent past medical history.  History reviewed. No pertinent past surgical history.  There were no vitals filed for this visit.  Visit Diagnosis:  Weakness of foot, right  Decreased range of motion of foot, right  Pain in joint, ankle and foot, right  Abnormality of gait      Subjective Assessment - 06/06/15 1010    Subjective My knee is hurting, I am trying to walk right, I just can't do it   Currently in Pain? Yes   Pain Score 4    Pain Location Ankle   Pain Orientation Right   Pain Descriptors / Indicators Aching   Aggravating Factors  trying to walk   Pain Relieving Factors rest                         [facility] Adult PT Treatment/Exercise - 06/06/15 0001    Ambulation/Gait   Gait Comments Worked with patient on gait, fast slow, step length, toes forward, head up, all cues to help with gait   High Level Balance   High Level Balance Activities Side stepping;Backward walking;Marching forwards;Weight-shifting turns   Manual Therapy   Manual Therapy Passive ROM   Passive ROM R ankle dorsi and plantar flexion, eversion,  inversion   Ankle Exercises: Aerobic   Elliptical Elliptical R=5 I = 10 x 5 minutes   Tread Mill 1.0 mph x 4 minutes working on weight shift, again a lot of cues are required   Ankle  Exercises: Standing   Other Standing Ankle Exercises weights shifts 50, cues to shift hips    Ankle Exercises: Stretches   Soleus Stretch 20 seconds;5 reps   Gastroc Stretch 5 reps;20 seconds   Ankle Exercises: Machines for Strengthening   Cybex Leg Press 80 # working on ankle only, toe raises as well as low load long durations stretches, with a lot of verbal cues and tactile help                PT Education - 06/06/15 1053    Education provided Yes   Education Details low load long duration stretches for the ankle   Person(s) Educated Patient   Methods Explanation;Demonstration;Handout   Comprehension Verbalized understanding;Returned demonstration;Verbal cues required;Tactile cues required;Need further instruction          PT Short Term Goals - 05/28/15 1436    PT SHORT TERM GOAL #3   Title Gait with 1 crutch or cane independently to 400'   Status Achieved           PT Long Term Goals - 05/31/15 1441    PT LONG TERM GOAL #3   Title Gait without device with normal gait pattern, no limp with good balance up to 500' and up/down stairs with reciprocal gait  pattern   Status On-going               Plan - 06/06/15 1147    Clinical Impression Statement Patient needs multiple tactile and verbal cues to help with gait, tends to immediately go back to stiff leg, toes turned out and small steps   PT Next Visit Plan Gait and ROM   Consulted and Agree with Plan of Care Patient        Problem List There are no active problems to display for this patient.   Sumner Boast., PT 06/06/2015, 11:49 AM  [address] [place], [place], [address] Phone: [phone]   Fax:  [phone]

## 2015-06-07 ENCOUNTER — Ambulatory Visit: Payer: BC Managed Care – PPO | Admitting: Physical Therapy

## 2015-06-07 ENCOUNTER — Encounter: Payer: Self-pay | Admitting: Physical Therapy

## 2015-06-07 DIAGNOSIS — M25571 Pain in right ankle and joints of right foot: Secondary | ICD-10-CM

## 2015-06-07 DIAGNOSIS — R269 Unspecified abnormalities of gait and mobility: Secondary | ICD-10-CM

## 2015-06-07 DIAGNOSIS — M2141 Flat foot [pes planus] (acquired), right foot: Secondary | ICD-10-CM | POA: Diagnosis not present

## 2015-06-07 DIAGNOSIS — R29898 Other symptoms and signs involving the musculoskeletal system: Secondary | ICD-10-CM

## 2015-06-07 DIAGNOSIS — M25674 Stiffness of right foot, not elsewhere classified: Secondary | ICD-10-CM

## 2015-06-07 NOTE — Therapy (Signed)
[place], [place], [address] Phone: [phone]   Fax:  [phone]  Physical Therapy Treatment  Patient Details  Name: [name] MRN: [id number] Date of Birth: 1961-07-19 Referring Provider:  Marybelle Killings, MD  Encounter Date: 06/07/2015      PT End of Session - 06/07/15 1543    Visit Number 7   Date for PT Re-Evaluation 07/17/15   PT Start Time 5809   PT Stop Time 1535   PT Time Calculation (min) 64 min   Activity Tolerance Patient tolerated treatment well   Behavior During Therapy [facility] for tasks assessed/performed      History reviewed. No pertinent past medical history.  History reviewed. No pertinent past surgical history.  There were no vitals filed for this visit.  Visit Diagnosis:  Decreased range of motion of foot, right  Weakness of foot, right  Pain in joint, ankle and foot, right  Abnormality of gait      Subjective Assessment - 06/07/15 1438    Subjective I was walking better when I left last time   Currently in Pain? Yes   Pain Score 4    Pain Location Ankle                         [facility] Adult PT Treatment/Exercise - 06/07/15 0001    Ambulation/Gait   Gait Comments Worked with patient on gait, fast slow, step length, toes forward, head up, all cues to help with gait   High Level Balance   High Level Balance Activities Side stepping;Backward walking;Marching forwards;Weight-shifting turns   Modalities   Modalities Vasopneumatic   Vasopneumatic   Number Minutes Vasopneumatic  15 minutes   Vasopnuematic Location  Ankle   Vasopneumatic Pressure Low   Vasopneumatic Temperature  45   Manual Therapy   Manual Therapy Passive ROM   Passive ROM R ankle dorsi and plantar flexion, eversion,  inversion   Ankle Exercises: Aerobic   Elliptical Elliptical R=5 I = 10 x 5 minutes   Tread Mill 1.0 mph x 4 minutes working on weight shift, again a lot of  cues are required   Ankle Exercises: Machines for Strengthening   Cybex Leg Press 80 # working on ankle only, toe raises as well as low load long durations stretches, with a lot of verbal cues and tactile help   Ankle Exercises: Stretches   Soleus Stretch 20 seconds;5 reps   Gastroc Stretch 5 reps;20 seconds                PT Education - 06/06/15 1053    Education provided Yes   Education Details low load long duration stretches for the ankle   Person(s) Educated Patient   Methods Explanation;Demonstration;Handout   Comprehension Verbalized understanding;Returned demonstration;Verbal cues required;Tactile cues required;Need further instruction          PT Short Term Goals - 05/28/15 1436    PT SHORT TERM GOAL #3   Title Gait with 1 crutch or cane independently to 400'   Status Achieved           PT Long Term Goals - 06/07/15 1545    PT LONG TERM GOAL #1   Title 0/10 pain most of time   Status On-going   PT LONG TERM GOAL #2   Title R ankle DF to neutral, 20 deg INV, 10 deg EV   Status On-going  Plan - 06/07/15 1544    Clinical Impression Statement Patient with much improved gait today with toes going forward and her taking bigger steps, still with a high rating of pain   PT Next Visit Plan Gait and ROM   Consulted and Agree with Plan of Care Patient        Problem List There are no active problems to display for this patient.   Sumner Boast., PT 06/07/2015, 3:46 PM  [address] [place], [place], [address] Phone: [phone]   Fax:  [phone]

## 2015-06-11 ENCOUNTER — Encounter: Payer: Self-pay | Admitting: Physical Therapy

## 2015-06-11 ENCOUNTER — Ambulatory Visit: Payer: BC Managed Care – PPO | Admitting: Physical Therapy

## 2015-06-11 DIAGNOSIS — R29898 Other symptoms and signs involving the musculoskeletal system: Secondary | ICD-10-CM

## 2015-06-11 DIAGNOSIS — M2141 Flat foot [pes planus] (acquired), right foot: Secondary | ICD-10-CM | POA: Diagnosis not present

## 2015-06-11 DIAGNOSIS — M25571 Pain in right ankle and joints of right foot: Secondary | ICD-10-CM

## 2015-06-11 DIAGNOSIS — R269 Unspecified abnormalities of gait and mobility: Secondary | ICD-10-CM

## 2015-06-11 DIAGNOSIS — M25674 Stiffness of right foot, not elsewhere classified: Secondary | ICD-10-CM

## 2015-06-11 NOTE — Therapy (Signed)
[place], [place], [address] Phone: [phone]   Fax:  [phone]  Physical Therapy Treatment  Patient Details  Name: [name] MRN: [id number] Date of Birth: [birth year] Referring Provider:  Marybelle Killings, MD  Encounter Date: 06/11/2015      PT End of Session - 06/11/15 1601    Visit Number 8   Date for PT Re-Evaluation 07/17/15   PT Start Time 2585   PT Stop Time 1616   PT Time Calculation (min) 60 min   Activity Tolerance Patient tolerated treatment well   Behavior During Therapy [facility] for tasks assessed/performed      History reviewed. No pertinent past medical history.  History reviewed. No pertinent past surgical history.  There were no vitals filed for this visit.  Visit Diagnosis:  Decreased range of motion of foot, right  Weakness of foot, right  Pain in joint, ankle and foot, right  Abnormality of gait      Subjective Assessment - 06/11/15 1528    Subjective I am just really stiff every morning   Currently in Pain? Yes   Pain Score 3    Pain Location Ankle   Aggravating Factors  walking                         [facility] Adult PT Treatment/Exercise - 06/11/15 0001    Ambulation/Gait   Gait Comments Worked with patient on gait, fast slow, step length, toes forward, head up, all cues to help with gait, went outside and walked around the building   Vasopneumatic   Number Minutes Vasopneumatic  15 minutes   Vasopnuematic Location  Ankle   Vasopneumatic Pressure Low   Vasopneumatic Temperature  45   Manual Therapy   Manual Therapy Passive ROM   Passive ROM R ankle dorsi and plantar flexion, eversion,  inversion   Ankle Exercises: Aerobic   Elliptical Elliptical R=5 I = 10 x 5 minutes   Ankle Exercises: Machines for Strengthening   Cybex Leg Press 80 # working on ankle only, toe raises as well as low load long durations stretches, with a lot of  verbal cues and tactile help   Ankle Exercises: Stretches   Soleus Stretch 20 seconds;5 reps   Gastroc Stretch 5 reps;20 seconds   Ankle Exercises: Standing   Other Standing Ankle Exercises SLS RLE, 10 seconds 3 reps  worked on pre gait weight shifting                  PT Short Term Goals - 05/28/15 1436    PT SHORT TERM GOAL #3   Title Gait with 1 crutch or cane independently to 400'   Status Achieved           PT Long Term Goals - 06/11/15 1603    PT LONG TERM GOAL #3   Title Gait without device with normal gait pattern, no limp with good balance up to 500' and up/down stairs with reciprocal gait pattern   Status Partially Met               Plan - 06/11/15 1602    Clinical Impression Statement still very tight ankle into DF, when she goes faster with gait she tends to turn foot out   PT Next Visit Plan Gait and ROM   Consulted and Agree with Plan of Care Patient        Problem  List There are no active problems to display for this patient.   Sumner Boast., PT 06/11/2015, 4:04 PM  [address] [place], [place], [address] Phone: [phone]   Fax:  [phone]

## 2015-06-12 ENCOUNTER — Encounter: Payer: BC Managed Care – PPO | Admitting: Physical Therapy

## 2015-06-13 ENCOUNTER — Ambulatory Visit: Payer: BC Managed Care – PPO | Admitting: Physical Therapy

## 2015-06-13 ENCOUNTER — Encounter: Payer: Self-pay | Admitting: Physical Therapy

## 2015-06-13 DIAGNOSIS — R269 Unspecified abnormalities of gait and mobility: Secondary | ICD-10-CM

## 2015-06-13 DIAGNOSIS — M25674 Stiffness of right foot, not elsewhere classified: Secondary | ICD-10-CM

## 2015-06-13 DIAGNOSIS — R29898 Other symptoms and signs involving the musculoskeletal system: Secondary | ICD-10-CM

## 2015-06-13 DIAGNOSIS — M25571 Pain in right ankle and joints of right foot: Secondary | ICD-10-CM

## 2015-06-13 DIAGNOSIS — M2141 Flat foot [pes planus] (acquired), right foot: Secondary | ICD-10-CM | POA: Diagnosis not present

## 2015-06-13 NOTE — Therapy (Signed)
[place], [place], [address] Phone: [phone]   Fax:  [phone]  Physical Therapy Treatment  Patient Details  Name: [name] MRN: [id number] Date of Birth: [birth year] Referring Provider:  Marybelle Killings, MD  Encounter Date: 06/13/2015      PT End of Session - 06/13/15 1559    Visit Number 10   Date for PT Re-Evaluation 07/17/15   PT Start Time 1527   PT Stop Time 1630   PT Time Calculation (min) 63 min   Activity Tolerance Patient tolerated treatment well   Behavior During Therapy [facility] for tasks assessed/performed      History reviewed. No pertinent past medical history.  History reviewed. No pertinent past surgical history.  There were no vitals filed for this visit.  Visit Diagnosis:  Decreased range of motion of foot, right  Weakness of foot, right  Pain in joint, ankle and foot, right  Abnormality of gait      Subjective Assessment - 06/13/15 1531    Subjective REal stiff today, sore in the medial and anterior ankle   Currently in Pain? Yes   Pain Score 3    Pain Location Ankle   Pain Orientation Right   Pain Descriptors / Indicators Aching;Burning   Pain Type Surgical pain   Aggravating Factors  standing and walking   Pain Relieving Factors rest                         [facility] Adult PT Treatment/Exercise - 06/13/15 0001    Ambulation/Gait   Gait Comments tried a little light jog, worked again on gait to get better step through and less toe out and antalgia   High Level Balance   High Level Balance Activities Side stepping;Backward walking;Marching forwards;Weight-shifting turns   Vasopneumatic   Number Minutes Vasopneumatic  15 minutes   Vasopnuematic Location  Ankle   Vasopneumatic Pressure Medium   Vasopneumatic Temperature  38   Ankle Exercises: Aerobic   Elliptical Elliptical R=5 I = 10 x 5 minutes   Ankle Exercises: Stretches   Soleus  Stretch 20 seconds;5 reps   Gastroc Stretch 5 reps;20 seconds   Ankle Exercises: Standing   SLS on and off airex, eyes open and closed, ball toss   Other Standing Ankle Exercises weights shifts 50, cues to shift hips    Other Standing Ankle Exercises SLS RLE, 10 seconds 3 reps  worked on pre gait weight shifting   Ankle Exercises: Machines for Strengthening   Cybex Leg Press 80 # working on ankle only, toe raises as well as low load long durations stretches, with a lot of verbal cues and tactile help, then 25# knee flexion, extension 10#                  PT Short Term Goals - 05/28/15 1436    PT SHORT TERM GOAL #3   Title Gait with 1 crutch or cane independently to 400'   Status Achieved           PT Long Term Goals - 06/11/15 1603    PT LONG TERM GOAL #3   Title Gait without device with normal gait pattern, no limp with good balance up to 500' and up/down stairs with reciprocal gait pattern   Status Partially Met               Plan - 06/13/15 1603  Clinical Impression Statement Scars are very tender, ankle is very stiff.  Can walk well with cues and going slow, when she speeds up she turns out her foot and there is more antalgia on the right   PT Next Visit Plan sees MD next week, will write MD note next visit   Consulted and Agree with Plan of Care Patient        Problem List There are no active problems to display for this patient.   Sumner Boast., PT 06/13/2015, 4:11 PM  [place], [place], [address] Phone: [phone]   Fax:  [phone]

## 2015-06-14 ENCOUNTER — Encounter: Payer: BC Managed Care – PPO | Admitting: Physical Therapy

## 2015-06-15 ENCOUNTER — Encounter: Payer: Self-pay | Admitting: Physical Therapy

## 2015-06-15 ENCOUNTER — Ambulatory Visit: Payer: BC Managed Care – PPO | Admitting: Physical Therapy

## 2015-06-15 DIAGNOSIS — M25571 Pain in right ankle and joints of right foot: Secondary | ICD-10-CM

## 2015-06-15 DIAGNOSIS — M2141 Flat foot [pes planus] (acquired), right foot: Secondary | ICD-10-CM | POA: Diagnosis not present

## 2015-06-15 DIAGNOSIS — R269 Unspecified abnormalities of gait and mobility: Secondary | ICD-10-CM

## 2015-06-15 DIAGNOSIS — M25674 Stiffness of right foot, not elsewhere classified: Secondary | ICD-10-CM

## 2015-06-15 DIAGNOSIS — R29898 Other symptoms and signs involving the musculoskeletal system: Secondary | ICD-10-CM

## 2015-06-15 NOTE — Therapy (Signed)
[place], [place], [address] Phone: [phone]   Fax:  [phone]  Physical Therapy Treatment  Patient Details  Name: [name] MRN: [id number] Date of Birth: August 16, 1961 Referring Alivya Wegman:  Marybelle Killings, MD  Encounter Date: 06/15/2015      PT End of Session - 06/15/15 1135    Visit Number 11   Date for PT Re-Evaluation 07/17/15   PT Start Time 1054   PT Stop Time 1200   PT Time Calculation (min) 66 min   Activity Tolerance Patient tolerated treatment well   Behavior During Therapy [facility] for tasks assessed/performed      History reviewed. No pertinent past medical history.  History reviewed. No pertinent past surgical history.  There were no vitals filed for this visit.  Visit Diagnosis:  Decreased range of motion of foot, right  Weakness of foot, right  Pain in joint, ankle and foot, right  Abnormality of gait      Subjective Assessment - 06/15/15 1107    Subjective Not bad, feeling better about my walk   Currently in Pain? Yes   Pain Score 2    Pain Location Ankle                         [facility] Adult PT Treatment/Exercise - 06/15/15 0001    Vasopneumatic   Number Minutes Vasopneumatic  15 minutes   Vasopnuematic Location  Ankle   Vasopneumatic Pressure Medium   Vasopneumatic Temperature  38   Manual Therapy   Manual Therapy Passive ROM   Passive ROM R ankle dorsi and plantar flexion, eversion,  inversion   Ankle Exercises: Aerobic   Elliptical Elliptical R=5 I = 15 x 5 minutes   Ankle Exercises: Stretches   Soleus Stretch 20 seconds;5 reps   Gastroc Stretch 5 reps;20 seconds   Ankle Exercises: Machines for Strengthening   Cybex Leg Press 80 # working on ankle only, toe raises as well as low load long durations stretches, with a lot of verbal cues and tactile help, then 25# knee flexion, extension 10#   Ankle Exercises: Standing   SLS on and off  airex, eyes open and closed, ball toss   Heel Walk (Round Trip) 30 feet   Toe Walk (Round Trip) 30 feet   Other Standing Ankle Exercises weights shifts 50, cues to shift hips    Other Standing Ankle Exercises SLS RLE, 10 seconds 3 reps  worked on pre gait weight shifting                  PT Short Term Goals - 05/28/15 1436    PT SHORT TERM GOAL #3   Title Gait with 1 crutch or cane independently to 400'   Status Achieved           PT Long Term Goals - 06/11/15 1603    PT LONG TERM GOAL #3   Title Gait without device with normal gait pattern, no limp with good balance up to 500' and up/down stairs with reciprocal gait pattern   Status Partially Met               Plan - 06/15/15 1136    Clinical Impression Statement Scar remains stiff and sore.  Better walk but the faster she goes the more limp and the toes go out.   PT Next Visit Plan sees MD next week, will write MD note  next visit        Problem List There are no active problems to display for this patient.   Sumner Boast., PT 06/15/2015, 11:40 AM  [place] [address] [place], [place], [address] Phone: [phone]   Fax:  [phone]

## 2015-06-18 ENCOUNTER — Ambulatory Visit: Payer: BC Managed Care – PPO | Admitting: Physical Therapy

## 2015-06-18 ENCOUNTER — Encounter: Payer: Self-pay | Admitting: Physical Therapy

## 2015-06-18 DIAGNOSIS — M2141 Flat foot [pes planus] (acquired), right foot: Secondary | ICD-10-CM | POA: Diagnosis not present

## 2015-06-18 DIAGNOSIS — R29898 Other symptoms and signs involving the musculoskeletal system: Secondary | ICD-10-CM

## 2015-06-18 DIAGNOSIS — M25674 Stiffness of right foot, not elsewhere classified: Secondary | ICD-10-CM

## 2015-06-18 DIAGNOSIS — M25571 Pain in right ankle and joints of right foot: Secondary | ICD-10-CM

## 2015-06-18 DIAGNOSIS — R269 Unspecified abnormalities of gait and mobility: Secondary | ICD-10-CM

## 2015-06-18 NOTE — Patient Instructions (Signed)
Ankle Dorsiflexion: Long-Sitting (Single Leg)   Sit facing anchor, tubing around forefoot, pull toes back toward head. Repeat 10__ times per set. Repeat with other leg. Do _2_ sets per session. Do _2_ sessions per day.  Plantarflexion (Eccentric), (Resistance Band)   Point foot down against resistance band. Slowly release for 3-5 seconds. 10___ reps per set, __2_ sets, _2__ times per day.  Ankle Eversion: Long-Sitting   Loop tubing around feet just below toes, legs separated as far as tolerated, toes pointed inward. Rotate ankles, pointing toes outward. Repeat 10__ times per set. Do _2_ sets per session. Do _2_ sessions per day.  Inversion (Eccentric), (Resistance Band)   Pull foot in against resistance band. Slowly release for 3-5 seconds. _10__ reps per set, __2_ sets, _2__ times per day.  

## 2015-06-18 NOTE — Therapy (Signed)
[place], [place], [address] Phone: [phone]   Fax:  [phone]  Physical Therapy Treatment  Patient Details  Name: Graziella Connery MRN: [id number] Date of Birth: 10/13/1960 Referring Provider:  Marybelle Killings, MD  Encounter Date: 06/18/2015      PT End of Session - 06/18/15 1554    Visit Number 12   Date for PT Re-Evaluation 07/17/15   PT Start Time 7939   PT Stop Time 1614   PT Time Calculation (min) 59 min   Activity Tolerance Patient tolerated treatment well   Behavior During Therapy [facility] for tasks assessed/performed      History reviewed. No pertinent past medical history.  History reviewed. No pertinent past surgical history.  There were no vitals filed for this visit.  Visit Diagnosis:  Decreased range of motion of foot, right - Plan: PT plan of care cert/re-cert  Weakness of foot, right - Plan: PT plan of care cert/re-cert  Pain in joint, ankle and foot, right - Plan: PT plan of care cert/re-cert  Abnormality of gait - Plan: PT plan of care cert/re-cert      Subjective Assessment - 06/18/15 1515    Subjective Not too bad.  I did a little walkiing this weekend.  Sore.   Currently in Pain? Yes   Pain Score 1    Pain Location Ankle   Pain Orientation Right   Pain Descriptors / Indicators Aching            [facility] PT Assessment - 06/18/15 0001    AROM   Right Ankle Dorsiflexion 1   Right Ankle Plantar Flexion 28   Right Ankle Inversion 11   Right Ankle Eversion 11   Strength   Right Ankle Dorsiflexion 4-/5   Right Ankle Plantar Flexion 4-/5   Right Ankle Inversion 3+/5   Right Ankle Eversion 3+/5   Ambulation/Gait   Gait Comments walked down and up stairs reciporocally, also outside around building some practive on uneven surfaces, grass, pine needles and slopes, no longer using assistive device, wearing fitting shoes, she was wearing a man's shoes that were way  too big due to swelling, now able to perform step through gait with toes going forward                     [facility] Adult PT Treatment/Exercise - 06/18/15 0001    Vasopneumatic   Number Minutes Vasopneumatic  15 minutes   Vasopnuematic Location  Ankle   Vasopneumatic Pressure Medium   Vasopneumatic Temperature  38   Manual Therapy   Manual Therapy Passive ROM   Passive ROM R ankle dorsi and plantar flexion, eversion,  inversion   Ankle Exercises: Aerobic   Elliptical Elliptical R=5 I = 15 x 5 minutes   Ankle Exercises: Stretches   Soleus Stretch 20 seconds;5 reps   Gastroc Stretch 5 reps;20 seconds   Ankle Exercises: Machines for Strengthening   Cybex Leg Press 80 # working on ankle only, toe raises as well as low load long durations stretches, with a lot of verbal cues and tactile help, then 25# knee flexion, extension 10#   Ankle Exercises: Standing   SLS on and off airex, eyes open and closed, ball toss   Heel Walk (Round Trip) 30 feet   Toe Walk (Round Trip) 30 feet   Other Standing Ankle Exercises SLS RLE, 10 seconds 3 reps  worked on pre gait weight shifting  Ankle Exercises: Supine   T-Band green all ankle motions                  PT Short Term Goals - 05/28/15 1436    PT SHORT TERM GOAL #3   Title Gait with 1 crutch or cane independently to 400'   Status Achieved           PT Long Term Goals - 06/18/15 1556    PT LONG TERM GOAL #1   Title 0/10 pain most of time   Status On-going   PT LONG TERM GOAL #2   Title R ankle DF to neutral, 20 deg INV, 10 deg EV   Status Partially Met   PT LONG TERM GOAL #3   Title Gait without device with normal gait pattern, no limp with good balance up to 500' and up/down stairs with reciprocal gait pattern   Status Partially Met               Plan - 06/18/15 1554    Clinical Impression Statement Over the past 2-3 weeks Ms. Nolton has made great gains in ROM and especially gait.  She is still limited  in DF at 1 degree, and inversion at 11 degrees.  Strength inversion and eversion remains weak.     PT Frequency 3x / week   PT Duration 6 weeks   PT Treatment/Interventions Moist Heat;Ultrasound;Electrical Stimulation;Cryotherapy;Gait training;DME Instruction;Stair training;Functional mobility training;Therapeutic activities;Therapeutic exercise;Balance training;Patient/family education;Scar mobilization;Passive range of motion;Manual techniques;Taping   PT Next Visit Plan would work further on ROM, strength and gait   Consulted and Agree with Plan of Care Patient        Problem List There are no active problems to display for this patient.   Sumner Boast., PT 06/18/2015, 3:59 PM  [place], [place], [address] Phone: [phone]   Fax:  [phone]

## 2015-06-20 ENCOUNTER — Encounter: Payer: Self-pay | Admitting: Physical Therapy

## 2015-06-20 ENCOUNTER — Ambulatory Visit: Payer: BC Managed Care – PPO | Admitting: Physical Therapy

## 2015-06-20 DIAGNOSIS — R29898 Other symptoms and signs involving the musculoskeletal system: Secondary | ICD-10-CM

## 2015-06-20 DIAGNOSIS — M2141 Flat foot [pes planus] (acquired), right foot: Secondary | ICD-10-CM | POA: Diagnosis not present

## 2015-06-20 DIAGNOSIS — R269 Unspecified abnormalities of gait and mobility: Secondary | ICD-10-CM

## 2015-06-20 DIAGNOSIS — M25571 Pain in right ankle and joints of right foot: Secondary | ICD-10-CM

## 2015-06-20 DIAGNOSIS — M25674 Stiffness of right foot, not elsewhere classified: Secondary | ICD-10-CM

## 2015-06-20 NOTE — Therapy (Signed)
[place] [address] [place], [place], [address] Phone: [phone]   Fax:  [phone]  Physical Therapy Treatment  Patient Details  Name: [name] MRN: [id number] Date of Birth: [birth year] Referring Provider:  Marybelle Killings, MD  Encounter Date: 06/20/2015      PT End of Session - 06/20/15 1601    Visit Number 13   Date for PT Re-Evaluation 07/17/15   PT Start Time 1510   PT Stop Time 1610   PT Time Calculation (min) 60 min   Activity Tolerance Patient tolerated treatment well   Behavior During Therapy [facility] for tasks assessed/performed      History reviewed. No pertinent past medical history.  History reviewed. No pertinent past surgical history.  There were no vitals filed for this visit.  Visit Diagnosis:  Decreased range of motion of foot, right  Weakness of foot, right  Pain in joint, ankle and foot, right  Abnormality of gait      Subjective Assessment - 06/20/15 1510    Subjective Saw MD yesterday, he is pleased with my walk but feels that I need more ROM.     Currently in Pain? Yes   Pain Score 1    Pain Location Ankle   Pain Orientation Right                         [facility] Adult PT Treatment/Exercise - 06/20/15 0001    High Level Balance   High Level Balance Comments resisted gait all directions   Vasopneumatic   Number Minutes Vasopneumatic  15 minutes   Vasopnuematic Location  Ankle   Vasopneumatic Pressure Medium   Vasopneumatic Temperature  35   Manual Therapy   Manual Therapy Passive ROM   Passive ROM R ankle dorsi and plantar flexion, eversion,  inversion   Ankle Exercises: Aerobic   Elliptical Elliptical R=5 I = 15 x 5 minutes   Ankle Exercises: Stretches   Soleus Stretch 20 seconds;5 reps   Gastroc Stretch 5 reps;20 seconds   Ankle Exercises: Machines for Strengthening   Cybex Leg Press 80 # working on ankle only, toe raises as well as low load long  durations stretches, with a lot of verbal cues and tactile help, then 25# knee flexion, extension 10#   Ankle Exercises: Standing   Heel Walk (Round Trip) 30 feet   Toe Walk (Round Trip) 30 feet   Ankle Exercises: Supine   T-Band green all ankle motions   Ankle Exercises: Plyometrics   Plyometric Exercises leg curls 35# 2x10, leg extension 10# 2x10   Plyometric Exercises light jog in the hall 40 ffet x 4 reps, stairs                  PT Short Term Goals - 05/28/15 1436    PT SHORT TERM GOAL #3   Title Gait with 1 crutch or cane independently to 400'   Status Achieved           PT Long Term Goals - 06/18/15 1556    PT LONG TERM GOAL #1   Title 0/10 pain most of time   Status On-going   PT LONG TERM GOAL #2   Title R ankle DF to neutral, 20 deg INV, 10 deg EV   Status Partially Met   PT LONG TERM GOAL #3   Title Gait without device with normal gait pattern, no limp with good balance  up to 500' and up/down stairs with reciprocal gait pattern   Status Partially Met               Plan - 06/20/15 1602    Clinical Impression Statement Saw MD. He was pleased with her gait but wanted increased DF.  Scar is also raised and sensitive   PT Next Visit Plan would work further on ROM, strength and gait   Consulted and Agree with Plan of Care Patient        Problem List There are no active problems to display for this patient.   Sumner Boast., PT 06/20/2015, 4:06 PM  [place], [place], [address] Phone: [phone]   Fax:  [phone]

## 2015-06-22 ENCOUNTER — Encounter: Payer: Self-pay | Admitting: Physical Therapy

## 2015-06-22 ENCOUNTER — Ambulatory Visit: Payer: BC Managed Care – PPO | Admitting: Physical Therapy

## 2015-06-22 DIAGNOSIS — M25674 Stiffness of right foot, not elsewhere classified: Secondary | ICD-10-CM

## 2015-06-22 DIAGNOSIS — M2141 Flat foot [pes planus] (acquired), right foot: Secondary | ICD-10-CM | POA: Diagnosis not present

## 2015-06-22 DIAGNOSIS — R29898 Other symptoms and signs involving the musculoskeletal system: Secondary | ICD-10-CM

## 2015-06-22 DIAGNOSIS — R269 Unspecified abnormalities of gait and mobility: Secondary | ICD-10-CM

## 2015-06-22 DIAGNOSIS — M25571 Pain in right ankle and joints of right foot: Secondary | ICD-10-CM

## 2015-06-22 NOTE — Therapy (Signed)
[facility] [address] [place], [place], [address] Phone: [phone]   Fax:  [phone]  Physical Therapy Treatment  Patient Details  Name: [name] MRN: [id number] Date of Birth: 03/07/1961 Referring Provider:  Yates, Mark C, MD  Encounter Date: 06/22/2015      PT End of Session - 06/22/15 1143    Visit Number 14   Date for PT Re-Evaluation 07/17/15   PT Start Time 1051   PT Stop Time 1147   PT Time Calculation (min) 56 min   Activity Tolerance Patient tolerated treatment well   Behavior During Therapy [facility] for tasks assessed/performed      History reviewed. No pertinent past medical history.  History reviewed. No pertinent past surgical history.  There were no vitals filed for this visit.  Visit Diagnosis:  Decreased range of motion of foot, right  Weakness of foot, right  Pain in joint, ankle and foot, right  Abnormality of gait      Subjective Assessment - 06/22/15 1053    Subjective I was a little sore.  A little stiff.   Currently in Pain? Yes   Pain Score 5    Pain Location Ankle   Pain Orientation Right   Pain Descriptors / Indicators Sore   Pain Type Surgical pain   Pain Onset More than a month ago   Pain Frequency Constant   Aggravating Factors  standing   Pain Relieving Factors ice                         [facility] Adult PT Treatment/Exercise - 06/22/15 0001    Ambulation/Gait   Gait Comments walked down and up stairs reciporocally, also outside around building some practive on uneven surfaces, grass, pine needles and slopes, no longer using assistive device, wearing fitting shoes,now able to perform step through gait with toes going forward   High Level Balance   High Level Balance Comments resisted gait all directions, minitramp marches and bounces, SLS on airex   Vasopneumatic   Number Minutes Vasopneumatic  15 minutes   Vasopnuematic Location  Ankle   Vasopneumatic Pressure Medium   Vasopneumatic Temperature  35   Manual Therapy   Manual therapy comments used mobilization belt with patient squatting to help get increased DF   Ankle Exercises: Aerobic   Elliptical Elliptical R=5 I = 15 x 5 minutes   Ankle Exercises: Stretches   Soleus Stretch 20 seconds;5 reps   Gastroc Stretch 5 reps;20 seconds   Ankle Exercises: Machines for Strengthening   Cybex Leg Press 80 # working on ankle only, toe raises as well as low load long durations stretches, with a lot of verbal cues and tactile help, then 25# knee flexion, extension 10#   Ankle Exercises: Supine   T-Band green all ankle motions   Ankle Exercises: Plyometrics   Plyometric Exercises light jog in the hall 40 ffet x 4 reps, stairs                  PT Short Term Goals - 05/28/15 1436    PT SHORT TERM GOAL #3   Title Gait with 1 crutch or cane independently to 400'   Status Achieved           PT Long Term Goals - 06/22/15 1145    PT LONG TERM GOAL #1   Title 0/10 pain most of time   Status Partially Met     PT LONG TERM GOAL #3   Title Gait without device with normal gait pattern, no limp with good balance up to 500' and up/down stairs with reciprocal gait pattern   Status Partially Met               Plan - 06/22/15 1144    Clinical Impression Statement DF is still limitted and reports that with her being on her feet she tends to have increased soreness and swelling   PT Next Visit Plan would work further on ROM, strength and gait   Consulted and Agree with Plan of Care Patient        Problem List There are no active problems to display for this patient.   ALBRIGHT,MICHAEL W. PT 06/22/2015, 11:51 AM  [facility] [address] [place], [place], [address] Phone: [phone]   Fax:  [phone]      

## 2015-06-25 ENCOUNTER — Encounter: Payer: Self-pay | Admitting: Physical Therapy

## 2015-06-25 ENCOUNTER — Ambulatory Visit: Payer: BC Managed Care – PPO | Admitting: Physical Therapy

## 2015-06-25 DIAGNOSIS — R269 Unspecified abnormalities of gait and mobility: Secondary | ICD-10-CM

## 2015-06-25 DIAGNOSIS — M25571 Pain in right ankle and joints of right foot: Secondary | ICD-10-CM

## 2015-06-25 DIAGNOSIS — M2141 Flat foot [pes planus] (acquired), right foot: Secondary | ICD-10-CM | POA: Diagnosis not present

## 2015-06-25 DIAGNOSIS — M25674 Stiffness of right foot, not elsewhere classified: Secondary | ICD-10-CM

## 2015-06-25 DIAGNOSIS — R29898 Other symptoms and signs involving the musculoskeletal system: Secondary | ICD-10-CM

## 2015-06-25 NOTE — Therapy (Signed)
[place], [place], [address] Phone: [phone]   Fax:  [phone]  Physical Therapy Treatment  Patient Details  Name: [name] MRN: [id number] Date of Birth: [birth year] Referring Provider:  Marybelle Killings, MD  Encounter Date: 06/25/2015      PT End of Session - 06/25/15 1139    Visit Number 15   Date for PT Re-Evaluation 07/17/15   PT Start Time 6283   PT Stop Time 1133   PT Time Calculation (min) 44 min   Activity Tolerance Patient tolerated treatment well   Behavior During Therapy [facility] for tasks assessed/performed      History reviewed. No pertinent past medical history.  History reviewed. No pertinent past surgical history.  There were no vitals filed for this visit.  Visit Diagnosis:  Decreased range of motion of foot, right  Weakness of foot, right  Pain in joint, ankle and foot, right  Abnormality of gait      Subjective Assessment - 06/25/15 1137    Subjective I feel like I am walking better   Currently in Pain? Yes   Pain Score 1    Pain Location Ankle   Pain Orientation Right                         [facility] Adult PT Treatment/Exercise - 06/25/15 0001    Ambulation/Gait   Gait Comments walked down and up stairs reciporocally, also outside around building some practive on uneven surfaces, grass, pine needles and slopes, no longer using assistive device, wearing fitting shoes,now able to perform step through gait with toes going forward   High Level Balance   High Level Balance Activities Side stepping;Backward walking;Marching forwards;Weight-shifting turns   Manual Therapy   Manual therapy comments used mobilization belt with patient squatting to help get increased DF   Passive ROM R ankle dorsi and plantar flexion, eversion,  inversion   Ankle Exercises: Aerobic   Tread Mill 1.8 mph x 6 minutes   Ankle Exercises: Stretches   Soleus Stretch 20  seconds;5 reps   Gastroc Stretch 5 reps;20 seconds   Ankle Exercises: Machines for Strengthening   Cybex Leg Press 80 # working on ankle only, toe raises as well as low load long durations stretches, with a lot of verbal cues and tactile help, then 25# knee flexion, extension 10#   Ankle Exercises: Supine   T-Band green all ankle motions   Ankle Exercises: Plyometrics   Plyometric Exercises light jog in the hall 40 ffet x 4 reps, stairs   Ankle Exercises: Seated   Other Seated Ankle Exercises minitramp bounce and march                  PT Short Term Goals - 05/28/15 1436    PT SHORT TERM GOAL #3   Title Gait with 1 crutch or cane independently to 400'   Status Achieved           PT Long Term Goals - 06/22/15 1145    PT LONG TERM GOAL #1   Title 0/10 pain most of time   Status Partially Met   PT LONG TERM GOAL #3   Title Gait without device with normal gait pattern, no limp with good balance up to 500' and up/down stairs with reciprocal gait pattern   Status Partially Met  Plan - 06/25/15 1140    Clinical Impression Statement Patient is walking much better.  Tends to have a little toe out gait.  Much less antalgic type gait.   PT Next Visit Plan would work further on ROM, strength and gait   Consulted and Agree with Plan of Care Patient        Problem List There are no active problems to display for this patient.   Sumner Boast., PT 06/25/2015, 11:43 AM  [place] [address] [place], [place], [address] Phone: [phone]   Fax:  [phone]

## 2015-06-27 ENCOUNTER — Encounter: Payer: Self-pay | Admitting: Physical Therapy

## 2015-06-27 ENCOUNTER — Ambulatory Visit: Payer: BC Managed Care – PPO | Admitting: Physical Therapy

## 2015-06-27 DIAGNOSIS — R269 Unspecified abnormalities of gait and mobility: Secondary | ICD-10-CM

## 2015-06-27 DIAGNOSIS — R29898 Other symptoms and signs involving the musculoskeletal system: Secondary | ICD-10-CM

## 2015-06-27 DIAGNOSIS — M25674 Stiffness of right foot, not elsewhere classified: Secondary | ICD-10-CM

## 2015-06-27 DIAGNOSIS — M2141 Flat foot [pes planus] (acquired), right foot: Secondary | ICD-10-CM | POA: Diagnosis not present

## 2015-06-27 DIAGNOSIS — M25571 Pain in right ankle and joints of right foot: Secondary | ICD-10-CM

## 2015-06-27 NOTE — Therapy (Signed)
[place], [place], [address] Phone: [phone]   Fax:  [phone]  Physical Therapy Treatment  Patient Details  Name: [name] MRN: [id number] Date of Birth: 1961-03-18 Referring Provider:  Marybelle Killings, MD  Encounter Date: 06/27/2015      PT End of Session - 06/27/15 1128    Visit Number 16   Date for PT Re-Evaluation 07/17/15   PT Start Time 1054   PT Stop Time 5732   PT Time Calculation (min) 62 min      History reviewed. No pertinent past medical history.  History reviewed. No pertinent past surgical history.  There were no vitals filed for this visit.  Visit Diagnosis:  Decreased range of motion of foot, right  Weakness of foot, right  Pain in joint, ankle and foot, right  Abnormality of gait      Subjective Assessment - 06/27/15 1106    Subjective I just continue to have pain most of the time   Currently in Pain? Yes   Pain Score 6    Pain Location Ankle   Pain Orientation Right   Pain Descriptors / Indicators Sore   Pain Type Surgical pain   Pain Onset More than a month ago   Pain Frequency Constant   Aggravating Factors  walking, stairs   Pain Relieving Factors rest and ice            [facility] PT Assessment - 06/27/15 0001    AROM   Right Ankle Dorsiflexion 6   Right Ankle Plantar Flexion 30   Right Ankle Inversion 12   Right Ankle Eversion 12                     [facility] Adult PT Treatment/Exercise - 06/27/15 0001    Ambulation/Gait   Gait Comments walked down and up stairs reciporocally, also outside around building some practive on uneven surfaces, grass, pine needles and slopes, no longer using assistive device, wearing fitting shoes,now able to perform step through gait with toes going forward   High Level Balance   High Level Balance Comments SLS 8# deadlift, on airex, airex red tband horizontal adduction   Electrical Stimulation   Electrical Stimulation Location R ankle   Electrical Stimulation Action IFC   Electrical Stimulation Parameters tolerance    Electrical Stimulation Goals Pain   Vasopneumatic   Number Minutes Vasopneumatic  15 minutes   Vasopnuematic Location  Ankle   Vasopneumatic Pressure Medium   Vasopneumatic Temperature  35   Ankle Exercises: Aerobic   Elliptical Elliptical R=5 I = 15 x 5 minutes   Ankle Exercises: Stretches   Soleus Stretch 20 seconds;5 reps   Gastroc Stretch 5 reps;20 seconds   Ankle Exercises: Machines for Strengthening   Cybex Leg Press 80 # working on ankle only, toe raises as well as low load long durations stretches, with a lot of verbal cues and tactile help, then 25# knee flexion, extension 10#   Ankle Exercises: Supine   T-Band green all ankle motions   Ankle Exercises: Plyometrics   Plyometric Exercises leg curls 35# 2x10, leg extension 10# 2x10   Plyometric Exercises light jog in the hall 40 ffet x 4 reps, stairs   Ankle Exercises: Seated   Other Seated Ankle Exercises minitramp bounce and march                  PT Short Term Goals - 05/28/15  1436    PT SHORT TERM GOAL #3   Title Gait with 1 crutch or cane independently to 400'   Status Achieved           PT Long Term Goals - 06/22/15 1145    PT LONG TERM GOAL #1   Title 0/10 pain most of time   Status Partially Met   PT LONG TERM GOAL #3   Title Gait without device with normal gait pattern, no limp with good balance up to 500' and up/down stairs with reciprocal gait pattern   Status Partially Met               Plan - 06/27/15 1129    Clinical Impression Statement Continues to gain AROM for DF.  Gait continues to improve.  She continues though to report pain int he right ankle   PT Next Visit Plan would work further on ROM, strength and gait        Problem List There are no active problems to display for this patient.   Sumner Boast., PT 06/27/2015, 11:31 AM  [place] [address] [place], [place], [address] Phone: [phone]   Fax:  [phone]

## 2015-06-29 ENCOUNTER — Ambulatory Visit: Payer: BC Managed Care – PPO | Admitting: Physical Therapy

## 2015-06-29 ENCOUNTER — Encounter: Payer: Self-pay | Admitting: Physical Therapy

## 2015-06-29 DIAGNOSIS — M25674 Stiffness of right foot, not elsewhere classified: Secondary | ICD-10-CM

## 2015-06-29 DIAGNOSIS — M25571 Pain in right ankle and joints of right foot: Secondary | ICD-10-CM

## 2015-06-29 DIAGNOSIS — R29898 Other symptoms and signs involving the musculoskeletal system: Secondary | ICD-10-CM

## 2015-06-29 DIAGNOSIS — M2141 Flat foot [pes planus] (acquired), right foot: Secondary | ICD-10-CM | POA: Diagnosis not present

## 2015-06-29 DIAGNOSIS — R269 Unspecified abnormalities of gait and mobility: Secondary | ICD-10-CM

## 2015-06-29 NOTE — Therapy (Signed)
[place], [place], [address] Phone: [phone]   Fax:  [phone]  Physical Therapy Treatment  Patient Details  Name: [name] MRN: [id number] Date of Birth: 06-Nov-1960 Referring Provider:  Marybelle Killings, MD  Encounter Date: 06/29/2015      PT End of Session - 06/29/15 1136    Visit Number 17   Date for PT Re-Evaluation 07/17/15   PT Start Time 7628   PT Stop Time 1142   PT Time Calculation (min) 51 min   Activity Tolerance Patient tolerated treatment well   Behavior During Therapy [facility] for tasks assessed/performed      History reviewed. No pertinent past medical history.  History reviewed. No pertinent past surgical history.  There were no vitals filed for this visit.  Visit Diagnosis:  Decreased range of motion of foot, right  Weakness of foot, right  Pain in joint, ankle and foot, right  Abnormality of gait      Subjective Assessment - 06/29/15 1132    Subjective I am trying to walk more, I used to walk 2 miles a day, but have been walking about 5 minutes a day now.   Currently in Pain? Yes   Pain Score 4    Pain Location Ankle   Pain Orientation Right   Pain Descriptors / Indicators Sore   Pain Type Surgical pain   Pain Onset More than a month ago   Aggravating Factors  walking, standing   Pain Relieving Factors rest and ice, I think the electric stuff helps            [facility] PT Assessment - 06/29/15 0001    Strength   Right Ankle Dorsiflexion 4/5   Right Ankle Plantar Flexion 4/5   Right Ankle Inversion 4-/5   Right Ankle Eversion 4-/5                     [facility] Adult PT Treatment/Exercise - 06/29/15 0001    Ambulation/Gait   Gait Comments Ambulated outside 4 laps around building, fatigued and sweating with this, when tired she started to ER the right foot, especially going up the hills   Electrical Stimulation   Electrical Stimulation  Location R ankle   Electrical Stimulation Action IFC   Electrical Stimulation Parameters tolerance   Electrical Stimulation Goals Pain   Vasopneumatic   Number Minutes Vasopneumatic  15 minutes   Vasopnuematic Location  Ankle   Vasopneumatic Pressure High   Vasopneumatic Temperature  35   Manual Therapy   Manual Therapy Passive ROM   Manual therapy comments used mobilization belt with patient squatting to help get increased DF   Passive ROM R ankle dorsi and plantar flexion, eversion,  inversion   Ankle Exercises: Aerobic   Elliptical Elliptical R=5 I = 15 x 5 minutes   Ankle Exercises: Seated   Other Seated Ankle Exercises minitramp bounce and march, also bounce with feet together and then apart, then forward and back                  PT Short Term Goals - 05/28/15 1436    PT SHORT TERM GOAL #3   Title Gait with 1 crutch or cane independently to 400'   Status Achieved           PT Long Term Goals - 06/29/15 1144    PT LONG TERM GOAL #2   Title R ankle DF to  neutral, 20 deg INV, 10 deg EV   Status Partially Met   PT LONG TERM GOAL #3   Title Gait without device with normal gait pattern, no limp with good balance up to 500' and up/down stairs with reciprocal gait pattern   Status Partially Met               Plan - 06/29/15 1143    Clinical Impression Statement Continues with pain rated a 4/10 with walking.  She externally rotates her foot when she is tired.   PT Next Visit Plan would work further on ROM, strength and gait   Consulted and Agree with Plan of Care Patient        Problem List There are no active problems to display for this patient.   Sumner Boast., PT 06/29/2015, 11:46 AM  [address] [place], [place], [address] Phone: [phone]   Fax:  [phone]

## 2015-07-03 ENCOUNTER — Encounter: Payer: Self-pay | Admitting: Physical Therapy

## 2015-07-03 ENCOUNTER — Ambulatory Visit: Payer: BC Managed Care – PPO | Attending: Orthopaedic Surgery | Admitting: Physical Therapy

## 2015-07-03 DIAGNOSIS — R269 Unspecified abnormalities of gait and mobility: Secondary | ICD-10-CM | POA: Diagnosis present

## 2015-07-03 DIAGNOSIS — M25571 Pain in right ankle and joints of right foot: Secondary | ICD-10-CM | POA: Diagnosis present

## 2015-07-03 DIAGNOSIS — M2141 Flat foot [pes planus] (acquired), right foot: Secondary | ICD-10-CM | POA: Diagnosis present

## 2015-07-03 DIAGNOSIS — R29898 Other symptoms and signs involving the musculoskeletal system: Secondary | ICD-10-CM

## 2015-07-03 DIAGNOSIS — M25674 Stiffness of right foot, not elsewhere classified: Secondary | ICD-10-CM | POA: Diagnosis present

## 2015-07-03 NOTE — Therapy (Signed)
[place], [place], [address] Phone: [phone]   Fax:  [phone]  Physical Therapy Treatment  Patient Details  Name: [name] MRN: [id number] Date of Birth: Nov 10, 1960 Referring Provider:  Marybelle Killings, MD  Encounter Date: 07/03/2015      PT End of Session - 07/03/15 1055    Visit Number 18   Date for PT Re-Evaluation 07/17/15   PT Start Time 1000   PT Stop Time 1110   PT Time Calculation (min) 70 min   Activity Tolerance Patient tolerated treatment well   Behavior During Therapy [facility] for tasks assessed/performed      History reviewed. No pertinent past medical history.  History reviewed. No pertinent past surgical history.  There were no vitals filed for this visit.  Visit Diagnosis:  Decreased range of motion of foot, right  Weakness of foot, right  Pain in joint, ankle and foot, right  Abnormality of gait                       [facility] Adult PT Treatment/Exercise - 07/03/15 0001    Ambulation/Gait   Gait Comments Ambulated outside 4 laps around building, fatigued and sweating with this, when tired she started to ER the right foot, especially going up the hills   High Level Balance   High Level Balance Comments resisted gait all directions, some jogging, some small jumps front to back and side to side, favors the right leg, does not put full weight on the leg   Electrical Stimulation   Electrical Stimulation Location R ankle   Electrical Stimulation Action IFC   Electrical Stimulation Parameters tolerance   Electrical Stimulation Goals Pain   Vasopneumatic   Number Minutes Vasopneumatic  15 minutes   Vasopnuematic Location  Ankle   Vasopneumatic Pressure High   Vasopneumatic Temperature  35   Manual Therapy   Manual Therapy Passive ROM   Manual therapy comments used mobilization belt with patient squatting to help get increased DF   Passive ROM R  ankle dorsi and plantar flexion, eversion,  inversion   Ankle Exercises: Seated   Other Seated Ankle Exercises minitramp bounce and march, also bounce with feet together and then apart, then forward and back   Ankle Exercises: Machines for Strengthening   Cybex Leg Press 80 # working on ankle only, toe raises as well as low load long durations stretches, with a lot of verbal cues and tactile help, then 25# knee flexion, extension 10#   Ankle Exercises: Plyometrics   Plyometric Exercises light jog in the hall 40 ffet x 4 reps, stairs                  PT Short Term Goals - 05/28/15 1436    PT SHORT TERM GOAL #3   Title Gait with 1 crutch or cane independently to 400'   Status Achieved           PT Long Term Goals - 07/03/15 1056    PT LONG TERM GOAL #3   Title Gait without device with normal gait pattern, no limp with good balance up to 500' and up/down stairs with reciprocal gait pattern   Status Achieved               Plan - 07/03/15 1055    Clinical Impression Statement C/O difficulty walking inthe morning, she tends to turn foot out for the first few  steps, also increased limp with the first few steps.   PT Next Visit Plan would work further on ROM, strength and gait   Consulted and Agree with Plan of Care Patient        Problem List There are no active problems to display for this patient.   Sumner Boast., PT 07/03/2015, 10:58 AM  [address] [place], [place], [address] Phone: [phone]   Fax:  [phone]

## 2015-07-06 ENCOUNTER — Encounter: Payer: Self-pay | Admitting: Physical Therapy

## 2015-07-06 ENCOUNTER — Ambulatory Visit: Payer: BC Managed Care – PPO | Admitting: Physical Therapy

## 2015-07-06 DIAGNOSIS — M25571 Pain in right ankle and joints of right foot: Secondary | ICD-10-CM

## 2015-07-06 DIAGNOSIS — M25674 Stiffness of right foot, not elsewhere classified: Secondary | ICD-10-CM | POA: Diagnosis not present

## 2015-07-06 DIAGNOSIS — R29898 Other symptoms and signs involving the musculoskeletal system: Secondary | ICD-10-CM

## 2015-07-06 DIAGNOSIS — R269 Unspecified abnormalities of gait and mobility: Secondary | ICD-10-CM

## 2015-07-06 NOTE — Therapy (Signed)
[place], [place], [address] Phone: [phone]   Fax:  [phone]  Physical Therapy Treatment  Patient Details  Name: [name] MRN: [id number] Date of Birth: [birth year] Referring Provider:  Marybelle Killings, MD  Encounter Date: 07/06/2015      PT End of Session - 07/06/15 0920    Visit Number 19   Date for PT Re-Evaluation 07/17/15   PT Start Time 4967   PT Stop Time 0946   PT Time Calculation (min) 62 min   Activity Tolerance Patient tolerated treatment well   Behavior During Therapy [facility] for tasks assessed/performed      History reviewed. No pertinent past medical history.  History reviewed. No pertinent past surgical history.  There were no vitals filed for this visit.  Visit Diagnosis:  Decreased range of motion of foot, right  Weakness of foot, right  Pain in joint, ankle and foot, right  Abnormality of gait      Subjective Assessment - 07/06/15 0843    Subjective I was pretty sore after the last visit.   Currently in Pain? Yes   Pain Score 3    Pain Location Ankle   Pain Orientation Right   Pain Descriptors / Indicators Sore   Pain Type Surgical pain   Pain Onset More than a month ago   Pain Frequency Constant   Aggravating Factors  walking, stairs   Pain Relieving Factors rest, the treatment            [facility] PT Assessment - 07/06/15 0001    AROM   Right Ankle Dorsiflexion 8   Right Ankle Plantar Flexion 32   Right Ankle Inversion 20   Right Ankle Eversion 12                     [facility] Adult PT Treatment/Exercise - 07/06/15 0001    Ambulation/Gait   Gait Comments stairs 4 x up and down 2 flights   High Level Balance   High Level Balance Comments fitter 1 blue/1black, jogging, standing dyna disc   Electrical Stimulation   Electrical Stimulation Location R ankle   Electrical Stimulation Action IFC   Electrical Stimulation Parameters  toerlance   Electrical Stimulation Goals Pain   Vasopneumatic   Number Minutes Vasopneumatic  15 minutes   Vasopnuematic Location  Ankle   Vasopneumatic Pressure High   Vasopneumatic Temperature  35   Manual Therapy   Manual Therapy Passive ROM   Manual therapy comments used mobilization belt with patient squatting to help get increased DF   Passive ROM R ankle dorsi and plantar flexion, eversion,  inversion   Ankle Exercises: Seated   Other Seated Ankle Exercises minitramp bounce and march, also bounce with feet together and then apart, then forward and back   Ankle Exercises: Plyometrics   Plyometric Exercises light jog in the hall 40 ffet x 4 reps, stairs   Ankle Exercises: Aerobic   Elliptical Elliptical R=5 I = 15 x 5 minutes   Ankle Exercises: Machines for Strengthening   Cybex Leg Press 80 # working on ankle only, toe raises as well as low load long durations stretches, with a lot of verbal cues and tactile help, then 25# knee flexion, extension 10#   Ankle Exercises: Stretches   Soleus Stretch 20 seconds;5 reps   Gastroc Stretch 5 reps;20 seconds   Ankle Exercises: Supine   T-Band green all ankle motions  PT Short Term Goals - 05/28/15 1436    PT SHORT TERM GOAL #3   Title Gait with 1 crutch or cane independently to 400'   Status Achieved           PT Long Term Goals - 07/06/15 2924    PT LONG TERM GOAL #1   Title 0/10 pain most of time   Status Partially Met   PT LONG TERM GOAL #2   Title R ankle DF to neutral, 20 deg INV, 10 deg EV   Status Achieved               Plan - 07/06/15 0921    Clinical Impression Statement Stiff in the morning.  First few steps are with foot turned out and then she straightens up, but can straighten with cues before stepping.   PT Next Visit Plan would work further on ROM, strength and gait   Consulted and Agree with Plan of Care Patient        Problem List There are no active problems to  display for this patient.   Sumner Boast., PT 07/06/2015, 9:26 AM  [address] [place], [place], [address] Phone: [phone]   Fax:  [phone]

## 2015-07-09 ENCOUNTER — Encounter: Payer: Self-pay | Admitting: Physical Therapy

## 2015-07-09 ENCOUNTER — Ambulatory Visit: Payer: BC Managed Care – PPO | Admitting: Physical Therapy

## 2015-07-09 DIAGNOSIS — R269 Unspecified abnormalities of gait and mobility: Secondary | ICD-10-CM

## 2015-07-09 DIAGNOSIS — M25571 Pain in right ankle and joints of right foot: Secondary | ICD-10-CM

## 2015-07-09 DIAGNOSIS — M25674 Stiffness of right foot, not elsewhere classified: Secondary | ICD-10-CM | POA: Diagnosis not present

## 2015-07-09 DIAGNOSIS — R29898 Other symptoms and signs involving the musculoskeletal system: Secondary | ICD-10-CM

## 2015-07-09 NOTE — Therapy (Signed)
[place], [place], [address] Phone: [phone]   Fax:  [phone]  Physical Therapy Treatment  Patient Details  Name: [name] MRN: [id number] Date of Birth: [birth year] Referring Provider:  Marybelle Killings, MD  Encounter Date: 07/09/2015      PT End of Session - 07/09/15 1437    Visit Number 20   Date for PT Re-Evaluation 07/17/15   PT Start Time 1749   PT Stop Time 1448   PT Time Calculation (min) 70 min   Activity Tolerance Patient tolerated treatment well   Behavior During Therapy [facility] for tasks assessed/performed      History reviewed. No pertinent past medical history.  History reviewed. No pertinent past surgical history.  There were no vitals filed for this visit.  Visit Diagnosis:  Decreased range of motion of foot, right  Weakness of foot, right  Pain in joint, ankle and foot, right  Abnormality of gait      Subjective Assessment - 07/09/15 1420    Subjective I wake up very stiff, it gets better as it goes and then pain and stiffness at the end of the day.   Currently in Pain? Yes   Pain Score 3    Pain Location Ankle   Pain Orientation Right   Pain Descriptors / Indicators Aching;Tightness   Pain Type Surgical pain                         [facility] Adult PT Treatment/Exercise - 07/09/15 0001    Ambulation/Gait   Gait Comments jogging up hills, walking down   High Level Balance   High Level Balance Comments fitter 1 blue/1black, jogging, standing dyna disc minitramp , step up and kicks   Acupuncturist Stimulation Location R ankle   Electrical Stimulation Action IFC   Electrical Stimulation Parameters tolerance   Electrical Stimulation Goals Pain   Vasopneumatic   Number Minutes Vasopneumatic  15 minutes   Vasopnuematic Location  Ankle   Vasopneumatic Pressure High   Vasopneumatic Temperature  35   Manual Therapy   Manual Therapy Passive ROM   Manual therapy comments used mobilization belt with patient squatting to help get increased DF   Passive ROM R ankle dorsi and plantar flexion, eversion,  inversion   Ankle Exercises: Seated   Other Seated Ankle Exercises minitramp bounce and march, also bounce with feet together and then apart, then forward and back   Ankle Exercises: Aerobic   Elliptical Elliptical R=5 I = 15 x 5 minutes   Ankle Exercises: Machines for Strengthening   Cybex Leg Press 80 # working on ankle only, toe raises as well as low load long durations stretches, with a lot of verbal cues and tactile help, then 25# knee flexion, extension 10#   Ankle Exercises: Stretches   Soleus Stretch 20 seconds;5 reps   Gastroc Stretch 5 reps;20 seconds   Ankle Exercises: Supine   T-Band green all ankle motions                  PT Short Term Goals - 05/28/15 1436    PT SHORT TERM GOAL #3   Title Gait with 1 crutch or cane independently to 400'   Status Achieved           PT Long Term Goals - 07/09/15 1439    PT LONG TERM GOAL #1   Title 0/10  pain most of time   Status On-going               Plan - 07-20-2015 1438    Clinical Impression Statement Gait is much better, with faster walking she continues to turn foot out but can correct as she goes.  Has been able to do a light jog with increased antalgic type pattern   PT Next Visit Plan would work further on ROM, strength and gait   Consulted and Agree with Plan of Care Patient          G-Codes - 2015/07/20 1440    Functional Assessment Tool Used foto   Functional Limitation Mobility: Walking and moving around   Mobility: Walking and Moving Around Current Status (D9833) At least 20 percent but less than 40 percent impaired, limited or restricted   Mobility: Walking and Moving Around Goal Status (A2505) At least 40 percent but less than 60 percent impaired, limited or restricted      Problem List There are no active  problems to display for this patient.   Sumner Boast., PT Jul 20, 2015, 2:43 PM  [place] [address] [place], [place], [address] Phone: [phone]   Fax:  [phone]

## 2015-07-11 ENCOUNTER — Encounter: Payer: Self-pay | Admitting: Physical Therapy

## 2015-07-11 ENCOUNTER — Ambulatory Visit: Payer: BC Managed Care – PPO | Admitting: Physical Therapy

## 2015-07-11 DIAGNOSIS — M25674 Stiffness of right foot, not elsewhere classified: Secondary | ICD-10-CM

## 2015-07-11 DIAGNOSIS — R269 Unspecified abnormalities of gait and mobility: Secondary | ICD-10-CM

## 2015-07-11 DIAGNOSIS — M25571 Pain in right ankle and joints of right foot: Secondary | ICD-10-CM

## 2015-07-11 DIAGNOSIS — R29898 Other symptoms and signs involving the musculoskeletal system: Secondary | ICD-10-CM

## 2015-07-11 NOTE — Therapy (Signed)
[place], [place], [address] Phone: [phone]   Fax:  [phone]  Physical Therapy Treatment  Patient Details  Name: [name] MRN: [id number] Date of Birth: August 20, 1961 Referring Provider:  Marybelle Killings, MD  Encounter Date: 07/11/2015      PT End of Session - 07/11/15 1141    Visit Number 21   Date for PT Re-Evaluation 07/17/15   PT Start Time 1012   PT Stop Time 1115   PT Time Calculation (min) 63 min   Activity Tolerance Patient tolerated treatment well   Behavior During Therapy [facility] for tasks assessed/performed      History reviewed. No pertinent past medical history.  History reviewed. No pertinent past surgical history.  There were no vitals filed for this visit.  Visit Diagnosis:  Decreased range of motion of foot, right  Weakness of foot, right  Pain in joint, ankle and foot, right  Abnormality of gait      Subjective Assessment - 07/11/15 1030    Subjective I am doing better in almost all things, just cant walk a lot or run   Currently in Pain? Yes   Pain Score 2    Pain Location Ankle   Pain Orientation Right   Pain Descriptors / Indicators Aching   Pain Onset More than a month ago   Aggravating Factors  trying to run   Pain Relieving Factors treatment                         [facility] Adult PT Treatment/Exercise - 07/11/15 0001    High Level Balance   High Level Balance Comments fitter 1 blue/1black, jogging, standing dyna disc minitramp , step up and kicks   Acupuncturist Location R ankle   Electrical Stimulation Action IFC   Electrical Stimulation Parameters tolerance   Electrical Stimulation Goals Pain   Vasopneumatic   Number Minutes Vasopneumatic  15 minutes   Vasopnuematic Location  Ankle   Vasopneumatic Pressure High   Vasopneumatic Temperature  35   Ankle Exercises: Aerobic   Elliptical  Elliptical R=5 I = 15 x 5 minutes   Tread Mill 2 mph and tehn 30 second bursts of 5 mph   Ankle Exercises: Machines for Strengthening   Cybex Leg Press 80 # working on ankle only, toe raises as well as low load long durations stretches, with a lot of verbal cues and tactile help, then 25# knee flexion, extension 10#   Ankle Exercises: Supine   T-Band green all ankle motions   Ankle Exercises: Standing   BAPS 15 reps   Rocker Board 3 minutes  on fitter   Rebounder rebounder kicks                  PT Short Term Goals - 05/28/15 1436    PT SHORT TERM GOAL #3   Title Gait with 1 crutch or cane independently to 400'   Status Achieved           PT Long Term Goals - 07/09/15 1439    PT LONG TERM GOAL #1   Title 0/10 pain most of time   Status On-going               Plan - 07/11/15 1141    Clinical Impression Statement Continues to improve with motions and gait and some less pain, c/o stiffness in the morning  and difficulty walking to shop   PT Next Visit Plan would work further on ROM, strength and gait   Consulted and Agree with Plan of Care Patient        Problem List There are no active problems to display for this patient.   Sumner Boast., PT 07/11/2015, 11:43 AM  [address] [place], [place], [address] Phone: [phone]   Fax:  [phone]

## 2015-07-13 ENCOUNTER — Ambulatory Visit: Payer: BC Managed Care – PPO | Admitting: Physical Therapy

## 2015-07-13 ENCOUNTER — Encounter: Payer: Self-pay | Admitting: Physical Therapy

## 2015-07-13 DIAGNOSIS — R29898 Other symptoms and signs involving the musculoskeletal system: Secondary | ICD-10-CM

## 2015-07-13 DIAGNOSIS — M25571 Pain in right ankle and joints of right foot: Secondary | ICD-10-CM

## 2015-07-13 DIAGNOSIS — M25674 Stiffness of right foot, not elsewhere classified: Secondary | ICD-10-CM

## 2015-07-13 DIAGNOSIS — R269 Unspecified abnormalities of gait and mobility: Secondary | ICD-10-CM

## 2015-07-13 NOTE — Therapy (Signed)
[place], [place], [address] Phone: [phone]   Fax:  [phone]  Physical Therapy Treatment  Patient Details  Name: [name] MRN: [id number] Date of Birth: 08-06-1961 No Data Recorded  Encounter Date: 07/13/2015      PT End of Session - 07/13/15 1106    Visit Number 22   Date for PT Re-Evaluation 07/17/15   PT Start Time 1016   PT Stop Time 1118   PT Time Calculation (min) 62 min   Activity Tolerance Patient tolerated treatment well   Behavior During Therapy [facility] for tasks assessed/performed      History reviewed. No pertinent past medical history.  History reviewed. No pertinent past surgical history.  There were no vitals filed for this visit.  Visit Diagnosis:  Decreased range of motion of foot, right  Weakness of foot, right  Pain in joint, ankle and foot, right  Abnormality of gait      Subjective Assessment - 07/13/15 1101    Subjective I have been complimented more on how much better my walking is.   Currently in Pain? Yes   Pain Score 3    Pain Location Ankle   Pain Orientation Right                         [facility] Adult PT Treatment/Exercise - 07/13/15 0001    High Level Balance   High Level Balance Activities Side stepping;Tandem walking   High Level Balance Comments fitter 1 blue/1black, jogging, standing dyna disc minitramp , step up and kicks, heel walking and toe walking   Electrical Stimulation   Electrical Stimulation Location R ankle   Electrical Stimulation Action IFC   Electrical Stimulation Parameters tolerance    Electrical Stimulation Goals Pain   Vasopneumatic   Number Minutes Vasopneumatic  15 minutes   Vasopnuematic Location  Ankle   Vasopneumatic Pressure High   Vasopneumatic Temperature  35   Ankle Exercises: Aerobic   Elliptical Elliptical R=5 I = 15 x 5 minutes   Ankle Exercises: Supine   T-Band green all ankle  motions, really focus on PF   Other Supine Ankle Exercises standing heel raises and then try to come down on jsut the right slowly, has great difficulty with this   Ankle Exercises: Standing   BAPS 15 reps   Rocker Board 3 minutes   Rebounder rebounder kicks   Ankle Exercises: Seated   Other Seated Ankle Exercises minitramp bounce and march, also bounce with feet together and then apart, then forward and back   Ankle Exercises: Stretches   Gastroc Stretch 5 reps;20 seconds                PT Education - 07/13/15 1106    Education provided Yes   Education Details asked patient to work on PF on the right   Person(s) Educated Patient   Methods Explanation;Demonstration   Comprehension Verbalized understanding          PT Short Term Goals - 05/28/15 1436    PT SHORT TERM GOAL #3   Title Gait with 1 crutch or cane independently to 400'   Status Achieved           PT Long Term Goals - 07/13/15 1107    PT LONG TERM GOAL #1   Title 0/10 pain most of time   Status Partially Met   PT LONG TERM GOAL #4  Title do a right leg only calf raise x 5   Time 4   Period Weeks   Status New               Plan - 07/13/15 1107    Clinical Impression Statement Improvement are coming as the gait improves, however we found today that she could not do a right leg calf raise, very weak with this   PT Next Visit Plan would work further on ROM, strength and gait   Consulted and Agree with Plan of Care Patient        Problem List There are no active problems to display for this patient.   Sumner Boast., PT 07/13/2015, 11:09 AM  [address] [place] [address] [place], [place], [address] Phone: [phone]   Fax:  [phone]  Name: Shenicka Sunderlin MRN: [id number] Date of Birth: Jan 09, 1961

## 2015-07-16 ENCOUNTER — Encounter: Payer: Self-pay | Admitting: Physical Therapy

## 2015-07-16 ENCOUNTER — Ambulatory Visit: Payer: BC Managed Care – PPO | Admitting: Physical Therapy

## 2015-07-16 DIAGNOSIS — R29898 Other symptoms and signs involving the musculoskeletal system: Secondary | ICD-10-CM

## 2015-07-16 DIAGNOSIS — M25674 Stiffness of right foot, not elsewhere classified: Secondary | ICD-10-CM | POA: Diagnosis not present

## 2015-07-16 DIAGNOSIS — M25571 Pain in right ankle and joints of right foot: Secondary | ICD-10-CM

## 2015-07-16 DIAGNOSIS — R269 Unspecified abnormalities of gait and mobility: Secondary | ICD-10-CM

## 2015-07-16 NOTE — Therapy (Signed)
[place] [place], [place], [address] Phone: [phone]   Fax:  [phone]  Physical Therapy Treatment  Patient Details  Name: [name] MRN: [id number] Date of Birth: December 08, 1960 No Data Recorded  Encounter Date: 07/16/2015      PT End of Session - 07/16/15 1347    Visit Number 23   Date for PT Re-Evaluation 07/17/15   PT Start Time 6438   PT Stop Time 1405   PT Time Calculation (min) 59 min   Activity Tolerance Patient tolerated treatment well   Behavior During Therapy [facility] for tasks assessed/performed      History reviewed. No pertinent past medical history.  History reviewed. No pertinent past surgical history.  There were no vitals filed for this visit.  Visit Diagnosis:  Decreased range of motion of foot, right  Weakness of foot, right  Pain in joint, ankle and foot, right  Abnormality of gait      Subjective Assessment - 07/16/15 1325    Subjective [place]   Currently in Pain? Yes   Pain Score 3    Pain Location Ankle   Pain Orientation Right                         [facility] Adult PT Treatment/Exercise - 07/16/15 0001    High Level Balance   High Level Balance Activities Side stepping;Tandem walking   High Level Balance Comments fitter 1 blue/1black, jogging, standing dyna disc minitramp , step up and kicks, heel walking and toe walking   Electrical Stimulation   Electrical Stimulation Location R ankle   Electrical Stimulation Action IFC   Electrical Stimulation Parameters elevated   Electrical Stimulation Goals Edema;Pain   Vasopneumatic   Number Minutes Vasopneumatic  15 minutes   Vasopnuematic Location  Ankle   Vasopneumatic Pressure High   Vasopneumatic Temperature  35   Ankle Exercises: Supine   T-Band green all ankle motions, really focus on PF   Other Supine Ankle Exercises standing heel raises and  then try to come down on jsut the right slowly, has great difficulty with this   Ankle Exercises: Standing   BAPS 15 reps   Ankle Exercises: Seated   Other Seated Ankle Exercises minitramp bounce and march, also bounce with feet together and then apart, then forward and back   Ankle Exercises: Stretches   Soleus Stretch 20 seconds;5 reps   Gastroc Stretch 5 reps;20 seconds                  PT Short Term Goals - 05/28/15 1436    PT SHORT TERM GOAL #3   Title Gait with 1 crutch or cane independently to 400'   Status Achieved           PT Long Term Goals - 07/13/15 1107    PT LONG TERM GOAL #1   Title 0/10 pain most of time   Status Partially Met   PT LONG TERM GOAL #4   Title do a right leg only calf raise x 5   Time 4   Period Weeks   Status New               Plan - 07/16/15 1348    Clinical Impression Statement unable to toe walk, unable to do a heel raise on the left.   PT Next Visit Plan  Write renewal next visit, she is going to try to walk at the fair tomorrow   Consulted and Agree with Plan of Care Patient        Problem List There are no active problems to display for this patient.   Sumner Boast., PT 07/16/2015, 1:50 PM  [address] [place], [place], [address] Phone: [phone]   Fax:  [phone]  Name: [name] MRN: [id number] Date of Birth: 02/18/1961

## 2015-07-18 ENCOUNTER — Encounter: Payer: Self-pay | Admitting: Physical Therapy

## 2015-07-18 ENCOUNTER — Ambulatory Visit: Payer: BC Managed Care – PPO | Admitting: Physical Therapy

## 2015-07-18 DIAGNOSIS — M25674 Stiffness of right foot, not elsewhere classified: Secondary | ICD-10-CM | POA: Diagnosis not present

## 2015-07-18 DIAGNOSIS — M25571 Pain in right ankle and joints of right foot: Secondary | ICD-10-CM

## 2015-07-18 DIAGNOSIS — R29898 Other symptoms and signs involving the musculoskeletal system: Secondary | ICD-10-CM

## 2015-07-18 DIAGNOSIS — R269 Unspecified abnormalities of gait and mobility: Secondary | ICD-10-CM

## 2015-07-18 NOTE — Therapy (Signed)
[place] [address] [place] [place], [place], [address] Phone: [phone]   Fax:  [phone]  Physical Therapy Treatment  Patient Details  Name: [name] MRN: [id number] Date of Birth: 1961-05-22 No Data Recorded  Encounter Date: 07/18/2015      PT End of Session - 07/18/15 1355    Visit Number 24   Date for PT Re-Evaluation 08/17/15   PT Start Time 1308   PT Stop Time 1410   PT Time Calculation (min) 62 min   Activity Tolerance Patient tolerated treatment well   Behavior During Therapy [facility] for tasks assessed/performed      History reviewed. No pertinent past medical history.  History reviewed. No pertinent past surgical history.  There were no vitals filed for this visit.  Visit Diagnosis:  Decreased range of motion of foot, right - Plan: PT plan of care cert/re-cert  Weakness of foot, right - Plan: PT plan of care cert/re-cert  Pain in joint, ankle and foot, right - Plan: PT plan of care cert/re-cert  Abnormality of gait - Plan: PT plan of care cert/re-cert      Subjective Assessment - 07/18/15 1312    Subjective Reports that she went to the fair and did "pretty well", " I was sore but I surprised my self"   Currently in Pain? Yes   Pain Score 4    Pain Location Ankle   Pain Orientation Right   Pain Descriptors / Indicators Aching;Tightness   Aggravating Factors  walking at the fair   Pain Relieving Factors ice and rest            [facility] PT Assessment - 07/18/15 0001    AROM   Right Ankle Dorsiflexion 10   Right Ankle Plantar Flexion 33   Right Ankle Inversion 20   Right Ankle Eversion 15   Strength   Right Ankle Plantar Flexion --  unable to do a single leg heel raise on the right                     [facility] Adult PT Treatment/Exercise - 07/18/15 0001    Ambulation/Gait   Gait Comments jogging up hills, walking down   High Level Balance   High Level Balance Comments  fitter 1 blue/1black, jogging, standing dyna disc minitramp , step up and kicks, heel walking and toe walking   Electrical Stimulation   Electrical Stimulation Location R ankle   Electrical Stimulation Action IFC   Electrical Stimulation Parameters elevated   Electrical Stimulation Goals Edema;Pain   Vasopneumatic   Number Minutes Vasopneumatic  15 minutes   Vasopnuematic Location  Ankle   Vasopneumatic Pressure High   Vasopneumatic Temperature  35   Ankle Exercises: Supine   T-Band black tband PF   Other Supine Ankle Exercises standing heel raises and then try to come down on jsut the right slowly, has great difficulty with this   Ankle Exercises: Standing   Rebounder rebounder kicks   Ankle Exercises: Seated   Other Seated Ankle Exercises minitramp bounce and march, also bounce with feet together and then apart, then forward and back   Ankle Exercises: Stretches   Soleus Stretch 20 seconds;5 reps   Gastroc Stretch 5 reps;20 seconds   Ankle Exercises: Aerobic   Elliptical Elliptical R=5 I = 15 x 5 minutes   Ankle Exercises: Machines for Strengthening   Cybex Leg Press 20# and no weight, with right leg only, needing a  lot of cues verbal and tactile to get the right calf to work for heel raises   Ankle Exercises: Plyometrics   Plyometric Exercises light jog in the hall 40 ffet x 4 reps, stairs                  PT Short Term Goals - 05/28/15 1436    PT SHORT TERM GOAL #3   Title Gait with 1 crutch or cane independently to 400'   Status Achieved           PT Long Term Goals - 07/18/15 1358    PT LONG TERM GOAL #1   Title 0/10 pain most of time   Status Partially Met   PT LONG TERM GOAL #2   Title R ankle DF to neutral, 20 deg INV, 10 deg EV   Status Achieved   PT LONG TERM GOAL #3   Title Gait without device with normal gait pattern, no limp with good balance up to 500' and up/down stairs with reciprocal gait pattern   Status Achieved   PT LONG TERM GOAL #4    Title do a right leg only calf raise x 5   Status On-going               Plan - 07/18/15 1356    Clinical Impression Statement Patient overall with great gains for wallking and ability, with less pain, she does have inability to truly toe walk due to weakness of the right calf   PT Next Visit Plan will work on getting the right calf stronger and look to D/C in the next 4 weeks   Consulted and Agree with Plan of Care Patient        Problem List There are no active problems to display for this patient.   Sumner Boast., PT 07/18/2015, 2:00 PM  [place], [place], [address] Phone: [phone]   Fax:  [phone]  Name: [name] MRN: [id number] Date of Birth: Oct 02, 1960

## 2015-07-20 ENCOUNTER — Ambulatory Visit: Payer: BC Managed Care – PPO | Admitting: Physical Therapy

## 2015-07-20 ENCOUNTER — Encounter: Payer: Self-pay | Admitting: Physical Therapy

## 2015-07-20 DIAGNOSIS — M25674 Stiffness of right foot, not elsewhere classified: Secondary | ICD-10-CM

## 2015-07-20 DIAGNOSIS — R29898 Other symptoms and signs involving the musculoskeletal system: Secondary | ICD-10-CM

## 2015-07-20 DIAGNOSIS — R269 Unspecified abnormalities of gait and mobility: Secondary | ICD-10-CM

## 2015-07-20 DIAGNOSIS — M25571 Pain in right ankle and joints of right foot: Secondary | ICD-10-CM

## 2015-07-20 NOTE — Therapy (Signed)
[place] [address] [place], [place], [address] Phone: [phone]   Fax:  [phone]  Physical Therapy Treatment  Patient Details  Name: [name] MRN: [id number] Date of Birth: 1961/09/01 No Data Recorded  Encounter Date: 07/20/2015      PT End of Session - 07/20/15 1006    Visit Number 25   Date for PT Re-Evaluation 08/17/15   PT Start Time 0927   PT Stop Time 1020   PT Time Calculation (min) 53 min   Activity Tolerance Patient tolerated treatment well   Behavior During Therapy [facility] for tasks assessed/performed      History reviewed. No pertinent past medical history.  History reviewed. No pertinent past surgical history.  There were no vitals filed for this visit.  Visit Diagnosis:  Decreased range of motion of foot, right  Weakness of foot, right  Pain in joint, ankle and foot, right  Abnormality of gait      Subjective Assessment - 07/20/15 0934    Subjective I am a little tired, but walking better.   Currently in Pain? Yes   Pain Score 2    Pain Location Ankle   Pain Orientation Right   Pain Descriptors / Indicators Aching                         [facility] Adult PT Treatment/Exercise - 07/20/15 0001    Ambulation/Gait   Gait Comments jogging up hills, walking down   High Level Balance   High Level Balance Activities Side stepping;Tandem walking   High Level Balance Comments fitter 1 blue/1black, jogging, standing dyna disc minitramp , step up and kicks, heel walking and toe walking   Ankle Exercises: Supine   T-Band black tband PF   Other Supine Ankle Exercises standing heel raises and then try to come down on jsut the right slowly, has great difficulty with this   Ankle Exercises: Standing   BAPS 15 reps   Rebounder rebounder kicks   Ankle Exercises: Seated   Other Seated Ankle Exercises minitramp bounce and march, also bounce with feet together and then apart,  then forward and back   Ankle Exercises: Stretches   Soleus Stretch 20 seconds;5 reps   Gastroc Stretch 5 reps;20 seconds   Ankle Exercises: Aerobic   Elliptical Elliptical R=5 I = 15 x 5 minutes   Ankle Exercises: Machines for Strengthening   Cybex Leg Press 20# and no weight, with right leg only, needing a lot of cues verbal and tactile to get the right calf to work for heel raises                  PT Short Term Goals - 05/28/15 1436    PT SHORT TERM GOAL #3   Title Gait with 1 crutch or cane independently to 400'   Status Achieved           PT Long Term Goals - 07/18/15 1358    PT LONG TERM GOAL #1   Title 0/10 pain most of time   Status Partially Met   PT LONG TERM GOAL #2   Title R ankle DF to neutral, 20 deg INV, 10 deg EV   Status Achieved   PT LONG TERM GOAL #3   Title Gait without device with normal gait pattern, no limp with good balance up to 500' and up/down stairs with reciprocal gait pattern   Status Achieved  PT LONG TERM GOAL #4   Title do a right leg only calf raise x 5   Status On-going               Plan - 07/20/15 1024    Clinical Impression Statement Patient with some increased ability to DF foot, but really does need verbal and tactile cues to perform correctly.   PT Next Visit Plan will work on getting the right calf stronger and look to D/C in the next 4 weeks   Consulted and Agree with Plan of Care Patient        Problem List There are no active problems to display for this patient.   Sumner Boast., PT 07/20/2015, 10:26 AM  [place] [address] [place], [place], [address] Phone: [phone]   Fax:  [phone]  Name: [name] MRN: [id number] Date of Birth: [birth year]

## 2015-07-24 ENCOUNTER — Ambulatory Visit: Payer: BC Managed Care – PPO | Admitting: Physical Therapy

## 2015-07-24 ENCOUNTER — Encounter: Payer: Self-pay | Admitting: Physical Therapy

## 2015-07-24 DIAGNOSIS — M25674 Stiffness of right foot, not elsewhere classified: Secondary | ICD-10-CM | POA: Diagnosis not present

## 2015-07-24 DIAGNOSIS — R29898 Other symptoms and signs involving the musculoskeletal system: Secondary | ICD-10-CM

## 2015-07-24 DIAGNOSIS — R269 Unspecified abnormalities of gait and mobility: Secondary | ICD-10-CM

## 2015-07-24 DIAGNOSIS — M25571 Pain in right ankle and joints of right foot: Secondary | ICD-10-CM

## 2015-07-24 NOTE — Therapy (Signed)
[place] [address] [place], [place], [address] Phone: [phone]   Fax:  [phone]  Physical Therapy Treatment  Patient Details  Name: [name] MRN: [id number] Date of Birth: Jul 14, 1961 No Data Recorded  Encounter Date: 07/24/2015      PT End of Session - 07/24/15 1436    Visit Number 26   Date for PT Re-Evaluation 08/17/15   PT Start Time 1344   PT Stop Time 1450   PT Time Calculation (min) 66 min   Activity Tolerance Patient tolerated treatment well   Behavior During Therapy [facility] for tasks assessed/performed      History reviewed. No pertinent past medical history.  History reviewed. No pertinent past surgical history.  There were no vitals filed for this visit.  Visit Diagnosis:  Decreased range of motion of foot, right  Weakness of foot, right  Pain in joint, ankle and foot, right  Abnormality of gait      Subjective Assessment - 07/24/15 1429    Subjective Can't raise up on my right toes.   Currently in Pain? Yes   Pain Score 2    Pain Location Ankle   Pain Orientation Right                         [facility] Adult PT Treatment/Exercise - 07/24/15 0001    Ambulation/Gait   Gait Comments jogging up hills, walking down   High Level Balance   High Level Balance Activities Side stepping;Tandem walking   High Level Balance Comments fitter 1 blue/1black, jogging, standing dyna disc minitramp , step up and kicks, heel walking and toe walking   Vasopneumatic   Number Minutes Vasopneumatic  15 minutes   Vasopnuematic Location  Ankle   Vasopneumatic Pressure High   Vasopneumatic Temperature  35   Ankle Exercises: Supine   T-Band black tband PF   Other Supine Ankle Exercises heel walking, toe walking   Other Supine Ankle Exercises standing heel raises and then try to come down on jsut the right slowly, has great difficulty with this   Ankle Exercises: Standing   Rebounder bounce, jog, alternating in out and front back jumps   Ankle Exercises: Stretches   Soleus Stretch 20 seconds;5 reps   Gastroc Stretch 5 reps;20 seconds   Ankle Exercises: Aerobic   Elliptical Elliptical R=5 I = 15 x 5 minutes   Ankle Exercises: Machines for Strengthening   Cybex Leg Press 20# and no weight, with right leg only, needing a lot of cues verbal and tactile to get the right calf to work for heel raises                  PT Short Term Goals - 05/28/15 1436    PT SHORT TERM GOAL #3   Title Gait with 1 crutch or cane independently to 400'   Status Achieved           PT Long Term Goals - 07/18/15 1358    PT LONG TERM GOAL #1   Title 0/10 pain most of time   Status Partially Met   PT LONG TERM GOAL #2   Title R ankle DF to neutral, 20 deg INV, 10 deg EV   Status Achieved   PT LONG TERM GOAL #3   Title Gait without device with normal gait pattern, no limp with good balance up to 500' and up/down stairs with reciprocal gait pattern  Status Achieved   PT LONG TERM GOAL #4   Title do a right leg only calf raise x 5   Status On-going               Plan - 07/24/15 1438    Clinical Impression Statement Still very weak with PF of the right foot, c/o stiffness in the AM   PT Next Visit Plan will work on getting the right calf stronger and look to D/C in the next 4 weeks   Consulted and Agree with Plan of Care Patient        Problem List There are no active problems to display for this patient.   Sumner Boast., PT 07/24/2015, 2:39 PM  [place] [address] [place], [place], [address] Phone: [phone]   Fax:  [phone]  Name: [name] MRN: [id number] Date of Birth: 1961/07/28

## 2015-07-26 ENCOUNTER — Encounter: Payer: Self-pay | Admitting: Physical Therapy

## 2015-07-26 ENCOUNTER — Ambulatory Visit: Payer: BC Managed Care – PPO | Admitting: Physical Therapy

## 2015-07-26 DIAGNOSIS — M25674 Stiffness of right foot, not elsewhere classified: Secondary | ICD-10-CM

## 2015-07-26 DIAGNOSIS — R269 Unspecified abnormalities of gait and mobility: Secondary | ICD-10-CM

## 2015-07-26 DIAGNOSIS — M25571 Pain in right ankle and joints of right foot: Secondary | ICD-10-CM

## 2015-07-26 DIAGNOSIS — R29898 Other symptoms and signs involving the musculoskeletal system: Secondary | ICD-10-CM

## 2015-07-26 NOTE — Therapy (Signed)
[place] [address] [place], [place], [address] Phone: [phone]   Fax:  [phone]  Physical Therapy Treatment  Patient Details  Name: [name] MRN: [id number] Date of Birth: October 04, 1960 No Data Recorded  Encounter Date: 07/26/2015      PT End of Session - 07/26/15 1535    Visit Number 27   Date for PT Re-Evaluation 08/17/15   PT Start Time 4825   PT Stop Time 1435   PT Time Calculation (min) 40 min   Activity Tolerance Patient tolerated treatment well   Behavior During Therapy [facility] for tasks assessed/performed      History reviewed. No pertinent past medical history.  History reviewed. No pertinent past surgical history.  There were no vitals filed for this visit.  Visit Diagnosis:  Decreased range of motion of foot, right  Weakness of foot, right  Pain in joint, ankle and foot, right  Abnormality of gait      Subjective Assessment - 07/26/15 1522    Subjective I have to go today to get to a class.   Currently in Pain? Yes   Pain Score 1    Pain Location Ankle   Pain Orientation Right                         [facility] Adult PT Treatment/Exercise - 07/26/15 0001    High Level Balance   High Level Balance Activities Side stepping;Tandem walking   High Level Balance Comments Resisted gait all directions with some gaurding/SBA with cues to go fast   Ankle Exercises: Supine   Other Supine Ankle Exercises heel walking, toe walking   Other Supine Ankle Exercises standing heel raises and then try to come down on jsut the right slowly, has great difficulty with this   Ankle Exercises: Standing   SLS activities working on solid and dynamic surfaces   Rebounder bounce, jog, alternating in out and front back jumps   Heel Walk (Round Trip) 30 feet   Other Standing Ankle Exercises jump rope   Other Standing Ankle Exercises Bosu balance activities   Ankle Exercises: Aerobic   Elliptical Elliptical R=5 I = 15 x 5 minutes   Ankle Exercises: Machines for Strengthening   Cybex Leg Press 20# and no weight, with right leg only, needing a lot of cues verbal and tactile to get the right calf to work for heel raises   Ankle Exercises: Stretches   Gastroc Stretch 5 reps;20 seconds   Ankle Exercises: Seated   Other Seated Ankle Exercises minitramp bounce and march, also bounce with feet together and then apart, then forward and back                  PT Short Term Goals - 05/28/15 1436    PT SHORT TERM GOAL #3   Title Gait with 1 crutch or cane independently to 400'   Status Achieved           PT Long Term Goals - 07/18/15 1358    PT LONG TERM GOAL #1   Title 0/10 pain most of time   Status Partially Met   PT LONG TERM GOAL #2   Title R ankle DF to neutral, 20 deg INV, 10 deg EV   Status Achieved   PT LONG TERM GOAL #3   Title Gait without device with normal gait pattern, no limp with good balance up to 500' and up/down  stairs with reciprocal gait pattern   Status Achieved   PT LONG TERM GOAL #4   Title do a right leg only calf raise x 5   Status On-going               Plan - 07/26/15 1537    Clinical Impression Statement Unabl eto do a single leg heel raise.  Overall good ROM and gait, but limited functional strength   PT Next Visit Plan will work on getting the right calf stronger and look to D/C in the next 4 weeks   Consulted and Agree with Plan of Care Patient        Problem List There are no active problems to display for this patient.   Sumner Boast., PT 07/26/2015, 3:39 PM  [place] [address] [place], [place], [address] Phone: [phone]   Fax:  [phone]  Name: Bren Borys MRN: [id number] Date of Birth: [birth year]

## 2015-07-27 ENCOUNTER — Encounter: Payer: Self-pay | Admitting: Physical Therapy

## 2015-07-27 ENCOUNTER — Ambulatory Visit: Payer: BC Managed Care – PPO | Admitting: Physical Therapy

## 2015-07-27 DIAGNOSIS — M25674 Stiffness of right foot, not elsewhere classified: Secondary | ICD-10-CM | POA: Diagnosis not present

## 2015-07-27 DIAGNOSIS — R29898 Other symptoms and signs involving the musculoskeletal system: Secondary | ICD-10-CM

## 2015-07-27 DIAGNOSIS — R269 Unspecified abnormalities of gait and mobility: Secondary | ICD-10-CM

## 2015-07-27 DIAGNOSIS — M25571 Pain in right ankle and joints of right foot: Secondary | ICD-10-CM

## 2015-07-27 NOTE — Therapy (Signed)
[place] [place], [place], [address] Phone: [phone]   Fax:  [phone]  Physical Therapy Treatment  Patient Details  Name: [name] MRN: [id number] Date of Birth: 10-09-1960 No Data Recorded  Encounter Date: 07/27/2015      PT End of Session - 07/27/15 1155    Visit Number 28   Date for PT Re-Evaluation 08/17/15   PT Start Time 1055   PT Stop Time 1150   PT Time Calculation (min) 55 min   Activity Tolerance Patient tolerated treatment well   Behavior During Therapy [facility] for tasks assessed/performed      History reviewed. No pertinent past medical history.  History reviewed. No pertinent past surgical history.  There were no vitals filed for this visit.  Visit Diagnosis:  Decreased range of motion of foot, right  Weakness of foot, right  Pain in joint, ankle and foot, right  Abnormality of gait      Subjective Assessment - 07/27/15 1152    Subjective I am sore.  Stiff after I sit.   Currently in Pain? Yes   Pain Score 3    Pain Location Ankle   Pain Orientation Right   Pain Descriptors / Indicators Tightness   Aggravating Factors  sitting   Pain Relieving Factors rest                         [facility] Adult PT Treatment/Exercise - 07/27/15 0001    Ankle Exercises: Supine   T-Band black tband PF   Other Supine Ankle Exercises heel walking, toe walking   Other Supine Ankle Exercises standing heel raises and then try to come down on jsut the right slowly, has great difficulty with this   Ankle Exercises: Standing   SLS activities working on solid and dynamic surfaces   Rebounder bounce, jog, alternating in out and front back jumps   Other Standing Ankle Exercises jump rope   Other Standing Ankle Exercises Bosu balance activities   Ankle Exercises: Aerobic   Elliptical Elliptical R=5 I = 15 x 5 minutes   Tread Mill 64mh x 1 minute 5.5 mph x 30 seconds 6  cycles   Ankle Exercises: Machines for Strengthening   Cybex Leg Press 20# and no weight, with right leg only, needing a lot of cues verbal and tactile to get the right calf to work for heel raises   Ankle Exercises: Stretches   Soleus Stretch 20 seconds;5 reps   Gastroc Stretch 5 reps;20 seconds                  PT Short Term Goals - 05/28/15 1436    PT SHORT TERM GOAL #3   Title Gait with 1 crutch or cane independently to 400'   Status Achieved           PT Long Term Goals - 07/18/15 1358    PT LONG TERM GOAL #1   Title 0/10 pain most of time   Status Partially Met   PT LONG TERM GOAL #2   Title R ankle DF to neutral, 20 deg INV, 10 deg EV   Status Achieved   PT LONG TERM GOAL #3   Title Gait without device with normal gait pattern, no limp with good balance up to 500' and up/down stairs with reciprocal gait pattern   Status Achieved   PT LONG TERM GOAL #4   Title do a  right leg only calf raise x 5   Status On-going               Plan - 07/27/15 1155    Clinical Impression Statement Still very weak with the single leg PF, needs cues and assist, also fearful of single leg balance on dynamic surface   PT Next Visit Plan will work on getting the right calf stronger and look to D/C in the next 3 weeks        Problem List There are no active problems to display for this patient.   Sumner Boast., PT 07/27/2015, 11:56 AM  [place] [address] [place], [place], [address] Phone: [phone]   Fax:  [phone]  Name: [name] MRN: [id number] Date of Birth: 1961-05-18

## 2015-07-30 ENCOUNTER — Ambulatory Visit: Payer: BC Managed Care – PPO | Admitting: Physical Therapy

## 2015-08-01 ENCOUNTER — Ambulatory Visit: Payer: BC Managed Care – PPO | Attending: Orthopaedic Surgery | Admitting: Physical Therapy

## 2015-08-01 DIAGNOSIS — R269 Unspecified abnormalities of gait and mobility: Secondary | ICD-10-CM

## 2015-08-01 DIAGNOSIS — M25674 Stiffness of right foot, not elsewhere classified: Secondary | ICD-10-CM | POA: Insufficient documentation

## 2015-08-01 DIAGNOSIS — M2141 Flat foot [pes planus] (acquired), right foot: Secondary | ICD-10-CM | POA: Diagnosis present

## 2015-08-01 DIAGNOSIS — M25571 Pain in right ankle and joints of right foot: Secondary | ICD-10-CM

## 2015-08-01 DIAGNOSIS — R29898 Other symptoms and signs involving the musculoskeletal system: Secondary | ICD-10-CM

## 2015-08-01 NOTE — Therapy (Signed)
[address] [place], [place], [address] Phone: [phone]   Fax:  [phone]  Physical Therapy Treatment  Patient Details  Name: [name] MRN: [id number] Date of Birth: October 09, 1960 No Data Recorded  Encounter Date: 08/01/2015      PT End of Session - 08/01/15 1140    Visit Number 83   PT Start Time 2202   PT Stop Time 1151   PT Time Calculation (min) 55 min      No past medical history on file.  No past surgical history on file.  There were no vitals filed for this visit.  Visit Diagnosis:  Decreased range of motion of foot, right  Weakness of foot, right  Pain in joint, ankle and foot, right  Abnormality of gait      Subjective Assessment - 08/01/15 1100    Subjective Pt reported increased soreness on the medial plantar side of foot along the arch. Reported increased activity over the weekend and lots of walking.    Currently in Pain? Yes   Pain Score 4    Pain Location Ankle   Pain Orientation Right;Medial;Other (Comment)   Pain Descriptors / Indicators Aching                         [facility] Adult PT Treatment/Exercise - 08/01/15 0001    High Level Balance   High Level Balance Activities Other (comment)  squats on dyno disc   Electrical Stimulation   Electrical Stimulation Location R ankle   Electrical Stimulation Action IFC   Electrical Stimulation Parameters elevated    Electrical Stimulation Goals Edema;Pain   Vasopneumatic   Number Minutes Vasopneumatic  15 minutes   Vasopnuematic Location  Ankle   Vasopneumatic Pressure High   Vasopneumatic Temperature  35   Ankle Exercises: Aerobic   Elliptical Elliptical R=5 I = 15 x 6 minutes  3 forward /3 back    Ankle Exercises: Standing   Rocker Board 3 minutes  R only PF/DF - Both inversion/eversion   Heel Raises 10 reps  on up 2, down eccentric right on black bar, and 6" step    Ankle Exercises: Stretches    Soleus Stretch 20 seconds;5 reps   Gastroc Stretch 2 reps;20 seconds                  PT Short Term Goals - 05/28/15 1436    PT SHORT TERM GOAL #3   Title Gait with 1 crutch or cane independently to 400'   Status Achieved           PT Long Term Goals - 07/18/15 1358    PT LONG TERM GOAL #1   Title 0/10 pain most of time   Status Partially Met   PT LONG TERM GOAL #2   Title R ankle DF to neutral, 20 deg INV, 10 deg EV   Status Achieved   PT LONG TERM GOAL #3   Title Gait without device with normal gait pattern, no limp with good balance up to 500' and up/down stairs with reciprocal gait pattern   Status Achieved   PT LONG TERM GOAL #4   Title do a right leg only calf raise x 5   Status On-going               Plan - 08/01/15 1142    Clinical Impression Statement Held off balistic exercises due to c/o increased  pain in foot/ankle. Pt required VC's and tactile cues to maintain knee extension with heel raise activities.  Pt requested E-stim for medial ankle pain.    PT Next Visit Plan Continue to work on gastroc strengthening and balance.           G-Codes - Aug 09, 2015 1129    Functional Assessment Tool Used foto   Functional Limitation Mobility: Walking and moving around   Mobility: Walking and Moving Around Current Status [phone]) At least 20 percent but less than 40 percent impaired, limited or restricted   Mobility: Walking and Moving Around Goal Status [phone]) At least 40 percent but less than 60 percent impaired, limited or restricted      Problem List There are no active problems to display for this patient.   Crist Fat, SPTA 2015/08/09, 11:45 AM  [address] [place], [place], [address] Phone: [phone]   Fax:  [phone]  Name: [name] MRN: [id number] Date of Birth: 03-Jan-1961

## 2015-08-03 ENCOUNTER — Encounter: Payer: Self-pay | Admitting: Physical Therapy

## 2015-08-03 ENCOUNTER — Ambulatory Visit: Payer: BC Managed Care – PPO | Admitting: Physical Therapy

## 2015-08-03 DIAGNOSIS — M25674 Stiffness of right foot, not elsewhere classified: Secondary | ICD-10-CM | POA: Diagnosis not present

## 2015-08-03 DIAGNOSIS — M25571 Pain in right ankle and joints of right foot: Secondary | ICD-10-CM

## 2015-08-03 DIAGNOSIS — R29898 Other symptoms and signs involving the musculoskeletal system: Secondary | ICD-10-CM

## 2015-08-03 DIAGNOSIS — R269 Unspecified abnormalities of gait and mobility: Secondary | ICD-10-CM

## 2015-08-03 NOTE — Therapy (Signed)
[address] [place], [place], [address] Phone: [phone]   Fax:  [phone]  Physical Therapy Treatment  Patient Details  Name: [name] MRN: [id number] Date of Birth: January 14, 1961 No Data Recorded  Encounter Date: 08/03/2015      PT End of Session - 08/03/15 1151    Visit Number 30   Date for PT Re-Evaluation 08/17/15   PT Start Time 1058   PT Stop Time 1152   PT Time Calculation (min) 54 min   Activity Tolerance Patient tolerated treatment well   Behavior During Therapy [facility] for tasks assessed/performed      History reviewed. No pertinent past medical history.  History reviewed. No pertinent past surgical history.  There were no vitals filed for this visit.  Visit Diagnosis:  Decreased range of motion of foot, right  Weakness of foot, right  Pain in joint, ankle and foot, right  Abnormality of gait      Subjective Assessment - 08/03/15 1114    Subjective I went back to my old big shoes because my scar is more tender.   Currently in Pain? Yes   Pain Score 5    Pain Location Ankle   Pain Orientation Right;Lower   Pain Descriptors / Indicators Aching;Tender   Pain Type Surgical pain   Aggravating Factors  unsrue, but she may have overdone it   Pain Relieving Factors rest                         [facility] Adult PT Treatment/Exercise - 08/03/15 0001    Ultrasound   Ultrasound Location right medial ankle foot   Ultrasound Parameters 100% 1MHx   Ultrasound Goals Pain   Ankle Exercises: Aerobic   Tread Mill 3.6 MPH on 10% incline x 5 minutes   Ankle Exercises: Standing   Rocker Board 3 minutes   Rebounder bounce, jog, alternating in out and front back jumps   Heel Raises 20 reps;2 seconds   Heel Walk (Round Trip) 30 feet x 2   Toe Walk (Round Trip) 30 feet x 2   Ankle Exercises: Stretches   Soleus Stretch 20 seconds;5 reps   Gastroc Stretch 2 reps;20 seconds   Ankle Exercises: Supine   Isometrics standing on the dynadisc heel/toe   T-Band black tband PF   Other Supine Ankle Exercises standing heel raises and then try to come down on jsut the right slowly, has great difficulty with this   Ankle Exercises: Machines for Strengthening   Cybex Leg Press 30# right only heel raise   Ankle Exercises: Seated   Other Seated Ankle Exercises minitramp bounce and march, also bounce with feet together and then apart, then forward and back                  PT Short Term Goals - 05/28/15 1436    PT SHORT TERM GOAL #3   Title Gait with 1 crutch or cane independently to 400'   Status Achieved           PT Long Term Goals - 08/03/15 1153    PT LONG TERM GOAL #1   Title 0/10 pain most of time   Status On-going               Plan - 08/03/15 1152    Clinical Impression Statement Continues to report increased foot pain around the navicular, reports there is bruising in that  area.  Tried [place] to see if that helps.   PT Next Visit Plan Continue to work on gastroc strengthening and balance.    Consulted and Agree with Plan of Care Patient        Problem List There are no active problems to display for this patient.   Sumner Boast., PT 08/03/2015, 11:54 AM  [address] [place], [place], [address] Phone: [phone]   Fax:  [phone]  Name: [name] MRN: [id number] Date of Birth: May 31, 1961

## 2015-08-06 ENCOUNTER — Ambulatory Visit: Payer: BC Managed Care – PPO | Admitting: Physical Therapy

## 2015-08-06 ENCOUNTER — Encounter: Payer: Self-pay | Admitting: Physical Therapy

## 2015-08-06 DIAGNOSIS — M25674 Stiffness of right foot, not elsewhere classified: Secondary | ICD-10-CM

## 2015-08-06 DIAGNOSIS — R269 Unspecified abnormalities of gait and mobility: Secondary | ICD-10-CM

## 2015-08-06 DIAGNOSIS — M25571 Pain in right ankle and joints of right foot: Secondary | ICD-10-CM

## 2015-08-06 DIAGNOSIS — R29898 Other symptoms and signs involving the musculoskeletal system: Secondary | ICD-10-CM

## 2015-08-06 NOTE — Therapy (Signed)
[address] [place], [place], [address] Phone: [phone]   Fax:  [phone]  Physical Therapy Treatment  Patient Details  Name: [name] MRN: [id number] Date of Birth: July 26, 1961 No Data Recorded  Encounter Date: 08/06/2015      PT End of Session - 08/06/15 1443    Visit Number 31   Date for PT Re-Evaluation 08/17/15   PT Start Time 6010   PT Stop Time 1459   PT Time Calculation (min) 62 min   Activity Tolerance Patient tolerated treatment well   Behavior During Therapy [facility] for tasks assessed/performed      History reviewed. No pertinent past medical history.  History reviewed. No pertinent past surgical history.  There were no vitals filed for this visit.  Visit Diagnosis:  Decreased range of motion of foot, right  Weakness of foot, right  Pain in joint, ankle and foot, right  Abnormality of gait      Subjective Assessment - 08/06/15 1356    Subjective Patient continued to have some increased pain and is worried.  She reports that she wants to be normal again.   Currently in Pain? Yes   Pain Score 5    Pain Location Ankle   Pain Orientation Right;Medial;Lower   Pain Descriptors / Indicators Aching;Tender                         [facility] Adult PT Treatment/Exercise - 08/06/15 0001    Electrical Stimulation   Electrical Stimulation Location right ankle   Electrical Stimulation Action IFC   Electrical Stimulation Parameters elevated   Electrical Stimulation Goals Edema;Pain   Ultrasound   Ultrasound Location right meidal ankle   Ultrasound Parameters 100% 1.0W/CM 2   Ultrasound Goals Pain   Vasopneumatic   Number Minutes Vasopneumatic  15 minutes   Vasopnuematic Location  Ankle   Vasopneumatic Pressure High   Vasopneumatic Temperature  35   Ankle Exercises: Aerobic   Stationary Bike 6 minutes   Ankle Exercises: Stretches   Soleus Stretch 20 seconds;5 reps    Gastroc Stretch 2 reps;20 seconds   Ankle Exercises: Supine   Isometrics sitting dynadiscs    T-Band black tband PF   Ankle Exercises: Seated   ABC's 1 rep                  PT Short Term Goals - 05/28/15 1436    PT SHORT TERM GOAL #3   Title Gait with 1 crutch or cane independently to 400'   Status Achieved           PT Long Term Goals - 08/06/15 1445    PT LONG TERM GOAL #1   Title 0/10 pain most of time   Status On-going   PT LONG TERM GOAL #4   Title do a right leg only calf raise x 5   Status Partially Met               Plan - 08/06/15 1443    Clinical Impression Statement Patient reports that she feels that she has been over doing it and is now having some increased pain in the right medial ankle   PT Next Visit Plan will back off of exercises and see if this helps the pain   Consulted and Agree with Plan of Care Patient        Problem List There are no active problems to display  for this patient.   Sumner Boast., PT 08/06/2015, 2:46 PM  [address] [place], [place], [address] Phone: [phone]   Fax:  [phone]  Name: Fendi Meinhardt MRN: [id number] Date of Birth: 1960/12/01

## 2015-08-08 ENCOUNTER — Encounter: Payer: Self-pay | Admitting: Physical Therapy

## 2015-08-08 ENCOUNTER — Ambulatory Visit: Payer: BC Managed Care – PPO | Admitting: Physical Therapy

## 2015-08-08 DIAGNOSIS — R29898 Other symptoms and signs involving the musculoskeletal system: Secondary | ICD-10-CM

## 2015-08-08 DIAGNOSIS — M25571 Pain in right ankle and joints of right foot: Secondary | ICD-10-CM

## 2015-08-08 DIAGNOSIS — M25674 Stiffness of right foot, not elsewhere classified: Secondary | ICD-10-CM

## 2015-08-08 DIAGNOSIS — R269 Unspecified abnormalities of gait and mobility: Secondary | ICD-10-CM

## 2015-08-08 NOTE — Therapy (Signed)
[address] [place], [place], [address] Phone: [phone]   Fax:  [phone]  Physical Therapy Treatment  Patient Details  Name: [name] MRN: [id number] Date of Birth: 06-18-1961 No Data Recorded  Encounter Date: 08/08/2015      PT End of Session - 08/08/15 1445    Visit Number 32   PT Start Time 8590   PT Stop Time 1450   PT Time Calculation (min) 35 min      No past medical history on file.  No past surgical history on file.  There were no vitals filed for this visit.  Visit Diagnosis:  Decreased range of motion of foot, right  Weakness of foot, right  Pain in joint, ankle and foot, right  Abnormality of gait      Subjective Assessment - 08/08/15 1440    Subjective Pt reports not much has changed since the last session. Slightly decreased pain but increased stiffness.    Currently in Pain? Yes   Pain Score 4    Pain Location Ankle   Pain Orientation Right;Medial;Lower   Pain Descriptors / Indicators Aching;Tender;Tightness                         [facility] Adult PT Treatment/Exercise - 08/08/15 0001    Electrical Stimulation   Electrical Stimulation Location right ankle   Electrical Stimulation Action IFC   Electrical Stimulation Parameters elevated   Electrical Stimulation Goals Edema;Pain   Ultrasound   Ultrasound Location right medial ankle   Ultrasound Parameters 100% 1.0 W/cm   Ultrasound Goals Pain   Vasopneumatic   Number Minutes Vasopneumatic  15 minutes   Vasopnuematic Location  Ankle   Vasopneumatic Pressure High   Vasopneumatic Temperature  35   Manual Therapy   Manual Therapy Passive ROM                  PT Short Term Goals - 05/28/15 1436    PT SHORT TERM GOAL #3   Title Gait with 1 crutch or cane independently to 400'   Status Achieved           PT Long Term Goals - 08/06/15 1445    PT LONG TERM GOAL #1   Title 0/10  pain most of time   Status On-going   PT LONG TERM GOAL #4   Title do a right leg only calf raise x 5   Status Partially Met               Plan - 08/08/15 1447    Clinical Impression Statement Pt arrived 15 minutes late for therapy.  Pt tolerated PROM without any c/o of increase pain or tenderness.     PT Next Visit Plan Work on light resistance and PROM.         Problem List There are no active problems to display for this patient.   Crist Fat, SPTA 08/08/2015, 2:50 PM  [place] [address] [place], [place], [address] Phone: [phone]   Fax:  [phone]  Name: Shamina Etheridge MRN: [id number] Date of Birth: 01-15-1961

## 2015-08-10 ENCOUNTER — Encounter: Payer: Self-pay | Admitting: Physical Therapy

## 2015-08-10 ENCOUNTER — Ambulatory Visit: Payer: BC Managed Care – PPO | Admitting: Physical Therapy

## 2015-08-10 DIAGNOSIS — R269 Unspecified abnormalities of gait and mobility: Secondary | ICD-10-CM

## 2015-08-10 DIAGNOSIS — M25674 Stiffness of right foot, not elsewhere classified: Secondary | ICD-10-CM | POA: Diagnosis not present

## 2015-08-10 DIAGNOSIS — R29898 Other symptoms and signs involving the musculoskeletal system: Secondary | ICD-10-CM

## 2015-08-10 DIAGNOSIS — M25571 Pain in right ankle and joints of right foot: Secondary | ICD-10-CM

## 2015-08-10 NOTE — Therapy (Signed)
[address] [place] [address] [place], [place], [address] Phone: [phone]   Fax:  [phone]  Physical Therapy Treatment  Patient Details  Name: [name] MRN: [id number] Date of Birth: 14-May-1961 No Data Recorded  Encounter Date: 08/10/2015      PT End of Session - 08/10/15 1130    Visit Number 33   Date for PT Re-Evaluation 08/17/15   PT Start Time 1050   PT Stop Time 1153   PT Time Calculation (min) 63 min   Activity Tolerance Patient tolerated treatment well   Behavior During Therapy [facility] for tasks assessed/performed      History reviewed. No pertinent past medical history.  History reviewed. No pertinent past surgical history.  There were no vitals filed for this visit.  Visit Diagnosis:  Decreased range of motion of foot, right  Weakness of foot, right  Pain in joint, ankle and foot, right  Abnormality of gait      Subjective Assessment - 08/10/15 1128    Subjective I think taking some time off of the high level stuff has helped, I am not hurting as much as I was   Currently in Pain? Yes   Pain Score 2    Pain Location Ankle   Pain Orientation Right;Medial   Aggravating Factors  a lot of walking   Pain Relieving Factors rest and the current treatment                         [facility] Adult PT Treatment/Exercise - 08/10/15 0001    Electrical Stimulation   Electrical Stimulation Location right ankle   Electrical Stimulation Action IFC   Electrical Stimulation Parameters elevated   Electrical Stimulation Goals Edema;Pain   Ultrasound   Ultrasound Location right medial ankle   Ultrasound Parameters 100% 1.4 w/cm2   Ultrasound Goals Pain   Vasopneumatic   Number Minutes Vasopneumatic  15 minutes   Vasopnuematic Location  Ankle   Vasopneumatic Pressure High   Vasopneumatic Temperature  35   Manual Therapy   Manual Therapy Passive ROM   Manual therapy comments used  mobilization belt with patient squatting to help get increased DF   Passive ROM R ankle dorsi and plantar flexion, eversion,  inversion   Ankle Exercises: Stretches   Soleus Stretch 20 seconds;5 reps   Gastroc Stretch 2 reps;20 seconds   Ankle Exercises: Supine   Isometrics sitting dynadiscs    T-Band black tband PF                  PT Short Term Goals - 05/28/15 1436    PT SHORT TERM GOAL #3   Title Gait with 1 crutch or cane independently to 400'   Status Achieved           PT Long Term Goals - 08/10/15 1132    PT LONG TERM GOAL #1   Title 0/10 pain most of time   Status On-going   PT LONG TERM GOAL #4   Title do a right leg only calf raise x 5   Status Partially Met               Plan - 08/10/15 1131    Clinical Impression Statement She is reporting less pain and difficulty since being lighter on the higher impact exercises this week.   PT Next Visit Plan plan for easy progression next week with information to her about d/c and  her doing on her own.   Consulted and Agree with Plan of Care Patient        Problem List There are no active problems to display for this patient.   Sumner Boast., PT 08/10/2015, 11:33 AM  [address] [place], [place], [address] Phone: [phone]   Fax:  [phone]  Name: [name] MRN: [id number] Date of Birth: 1960-11-29

## 2015-08-15 ENCOUNTER — Ambulatory Visit: Payer: BC Managed Care – PPO | Admitting: Physical Therapy

## 2015-08-15 ENCOUNTER — Encounter: Payer: Self-pay | Admitting: Physical Therapy

## 2015-08-15 DIAGNOSIS — M25674 Stiffness of right foot, not elsewhere classified: Secondary | ICD-10-CM

## 2015-08-15 DIAGNOSIS — R29898 Other symptoms and signs involving the musculoskeletal system: Secondary | ICD-10-CM

## 2015-08-15 DIAGNOSIS — M25571 Pain in right ankle and joints of right foot: Secondary | ICD-10-CM

## 2015-08-15 DIAGNOSIS — R269 Unspecified abnormalities of gait and mobility: Secondary | ICD-10-CM

## 2015-08-15 NOTE — Therapy (Signed)
[address] [place], [place], [address] Phone: [phone]   Fax:  [phone]  Physical Therapy Treatment  Patient Details  Name: [name] MRN: [id number] Date of Birth: 10/13/1960 No Data Recorded  Encounter Date: 08/15/2015      PT End of Session - 08/15/15 1454    Visit Number 34   Date for PT Re-Evaluation 08/17/15   PT Start Time 1426   PT Stop Time 1515   PT Time Calculation (min) 49 min      History reviewed. No pertinent past medical history.  History reviewed. No pertinent past surgical history.  There were no vitals filed for this visit.  Visit Diagnosis:  Decreased range of motion of foot, right  Weakness of foot, right  Pain in joint, ankle and foot, right  Abnormality of gait      Subjective Assessment - 08/15/15 1452    Subjective I really still feel I am doing better, I think I was just pushing myself too hard.   Currently in Pain? Yes   Pain Score 1    Pain Location Ankle   Pain Orientation Right;Medial   Pain Descriptors / Indicators Tender   Aggravating Factors  doing too much   Pain Relieving Factors rest and treatment                         [facility] Adult PT Treatment/Exercise - 08/15/15 0001    Ambulation/Gait   Gait Comments gait in hall slight toe out, but minimal antalgic gait   Electrical Stimulation   Electrical Stimulation Location right ankle   Electrical Stimulation Action IFC   Electrical Stimulation Parameters elevated   Electrical Stimulation Goals Edema   Vasopneumatic   Number Minutes Vasopneumatic  15 minutes   Vasopnuematic Location  Ankle   Vasopneumatic Pressure High   Vasopneumatic Temperature  35   Manual Therapy   Manual Therapy Passive ROM   Manual therapy comments used mobilization belt with patient squatting to help get increased DF   Passive ROM R ankle dorsi and plantar flexion, eversion,  inversion                   PT Short Term Goals - 05/28/15 1436    PT SHORT TERM GOAL #3   Title Gait with 1 crutch or cane independently to 400'   Status Achieved           PT Long Term Goals - 08/15/15 1459    PT LONG TERM GOAL #1   Title 0/10 pain most of time   Status Partially Met   PT LONG TERM GOAL #2   Title R ankle DF to neutral, 20 deg INV, 10 deg EV   Status Achieved   PT LONG TERM GOAL #3   Title Gait without device with normal gait pattern, no limp with good balance up to 500' and up/down stairs with reciprocal gait pattern   Status Achieved   PT LONG TERM GOAL #4   Title do a right leg only calf raise x 5   Status Partially Met               Plan - 08/15/15 1455    Clinical Impression Statement Less difficulty with walking, less swelling over the navicular and less discoloration around the navicular with the current treatment.   PT Next Visit Plan look to D/C next visit with assuring her  independence with HEP and slow progression   Consulted and Agree with Plan of Care Patient        Problem List There are no active problems to display for this patient.   Sumner Boast., PT 08/15/2015, 3:02 PM  [address] [place], [place], [address] Phone: [phone]   Fax:  [phone]  Name: Markeshia Giebel MRN: [id number] Date of Birth: Oct 19, 1960

## 2015-08-17 ENCOUNTER — Encounter: Payer: Self-pay | Admitting: Physical Therapy

## 2015-08-17 ENCOUNTER — Ambulatory Visit: Payer: BC Managed Care – PPO | Admitting: Physical Therapy

## 2015-08-17 DIAGNOSIS — M25571 Pain in right ankle and joints of right foot: Secondary | ICD-10-CM

## 2015-08-17 DIAGNOSIS — M25674 Stiffness of right foot, not elsewhere classified: Secondary | ICD-10-CM | POA: Diagnosis not present

## 2015-08-17 DIAGNOSIS — R29898 Other symptoms and signs involving the musculoskeletal system: Secondary | ICD-10-CM

## 2015-08-17 DIAGNOSIS — R269 Unspecified abnormalities of gait and mobility: Secondary | ICD-10-CM

## 2015-08-17 NOTE — Therapy (Signed)
[address] [place], [place], [address] Phone: [phone]   Fax:  [phone]  Physical Therapy Treatment  Patient Details  Name: [name] MRN: [id number] Date of Birth: [birth year] No Data Recorded  Encounter Date: 08/17/2015      PT End of Session - 08/17/15 1101    Visit Number 35   Date for PT Re-Evaluation 08/17/15   PT Start Time 1002   PT Stop Time 1100   PT Time Calculation (min) 58 min   Activity Tolerance Patient tolerated treatment well   Behavior During Therapy [facility] for tasks assessed/performed      History reviewed. No pertinent past medical history.  History reviewed. No pertinent past surgical history.  There were no vitals filed for this visit.  Visit Diagnosis:  Decreased range of motion of foot, right  Weakness of foot, right  Pain in joint, ankle and foot, right  Abnormality of gait      Subjective Assessment - 08/17/15 1058    Subjective I am better, I am still frustrated with how stiff I am in the mornings.   Currently in Pain? Yes   Pain Score 1    Pain Location Ankle                         [facility] Adult PT Treatment/Exercise - 08/17/15 0001    Ambulation/Gait   Gait Comments reviewed gait on even and uneven surfaced as well as stairs   Manual Therapy   Manual Therapy Passive ROM   Manual therapy comments used mobilization belt with patient squatting to help get increased DF   Passive ROM R ankle dorsi and plantar flexion, eversion,  inversion   Ankle Exercises: Stretches   Soleus Stretch 20 seconds;5 reps   Gastroc Stretch 2 reps;20 seconds   Ankle Exercises: Supine   Isometrics sitting dynadiscs    T-Band black tband PF   Other Supine Ankle Exercises standing heel raises and then try to come down on jsut the right slowly, has great difficulty with this   Ankle Exercises: Aerobic   Elliptical Elliptical R=5 I = 15 x 6 minutes   Ankle  Exercises: Seated   Heel Raises 10 reps;3 seconds   Toe Raise 10 reps;3 seconds   Ankle Exercises: Standing   Rocker Board 3 minutes   Ankle Exercises: Machines for Strengthening   Cybex Leg Press 30# right only heel raise                PT Education - 08/17/15 1101    Education provided Yes   Education Details went over safety, gait, RICE, rest and how to advance the PF to regain that strength at home   Person(s) Educated Patient   Methods Explanation;Demonstration   Comprehension Verbalized understanding          PT Short Term Goals - 05/28/15 1436    PT SHORT TERM GOAL #3   Title Gait with 1 crutch or cane independently to 400'   Status Achieved           PT Long Term Goals - 08/17/15 1103    PT LONG TERM GOAL #1   Title 0/10 pain most of time   Status Achieved   PT LONG TERM GOAL #2   Title R ankle DF to neutral, 20 deg INV, 10 deg EV   Status Achieved   PT LONG TERM GOAL #3  Title Gait without device with normal gait pattern, no limp with good balance up to 500' and up/down stairs with reciprocal gait pattern   Status Achieved   PT LONG TERM GOAL #4   Title do a right leg only calf raise x 5   Status Achieved               Plan - 08/17/15 1102    Clinical Impression Statement Patient has met the goals, her only issue is still weakness in the calf unable to do a lot of full weight PF/heel raise   PT Next Visit Plan D/C     PHYSICAL THERAPY DISCHARGE SUMMARY   Plan: Patient agrees to discharge.  Patient goals were met. Patient is being discharged due to meeting the stated rehab goals.  ?????        Problem List There are no active problems to display for this patient.   Sumner Boast., PT 08/17/2015, 11:04 AM  [address] [place], [place], [address] Phone: [phone]   Fax:  [phone]  Name: [name] MRN: [id number] Date of Birth:  10/23/1960

## 2016-11-12 IMAGING — CR DG ANKLE COMPLETE 3+V*R*
3 series · 3 of 3 positions shown · non-contrast
Comparison: None

CLINICAL DATA: Heard and felt a snap in her RIGHT ankle while she
was walking downtown, did not fall, swelling, ankle injury

EXAM:
RIGHT ANKLE - COMPLETE 3+ VIEW

[x ankle ap right (1 of 2)]
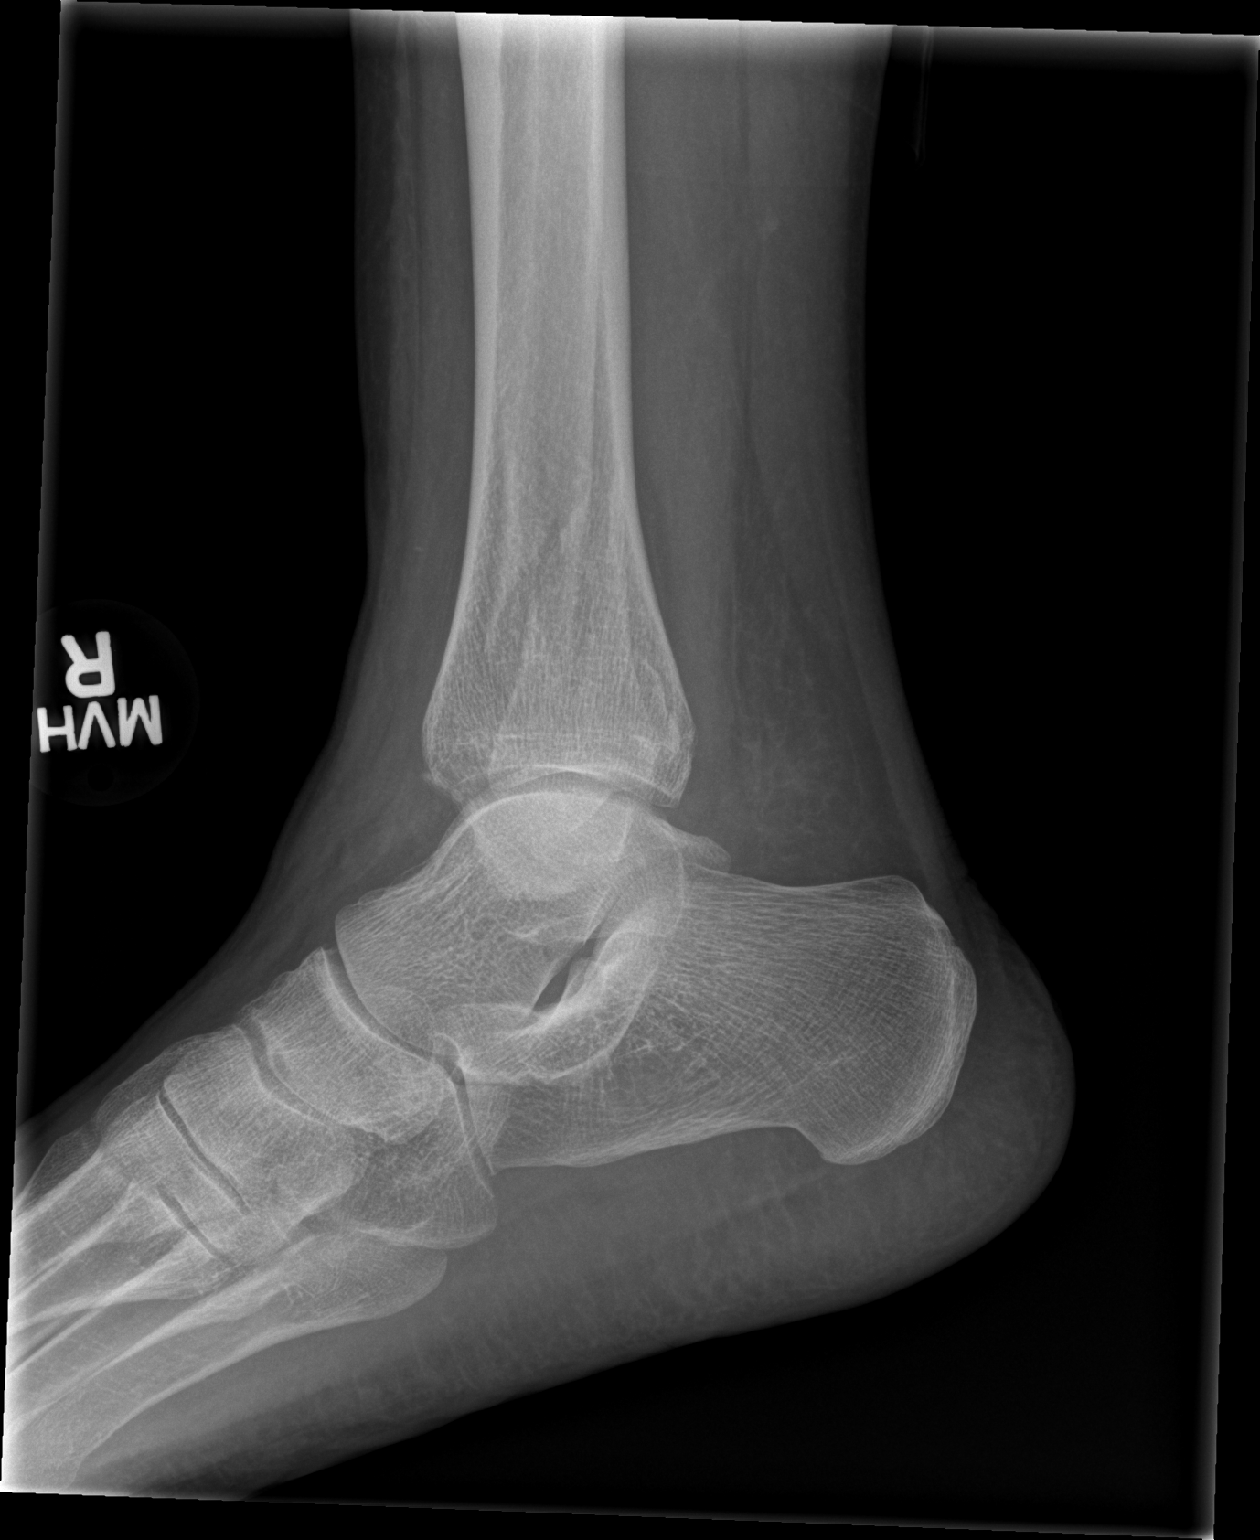

[x ankle ap right (2 of 2)]
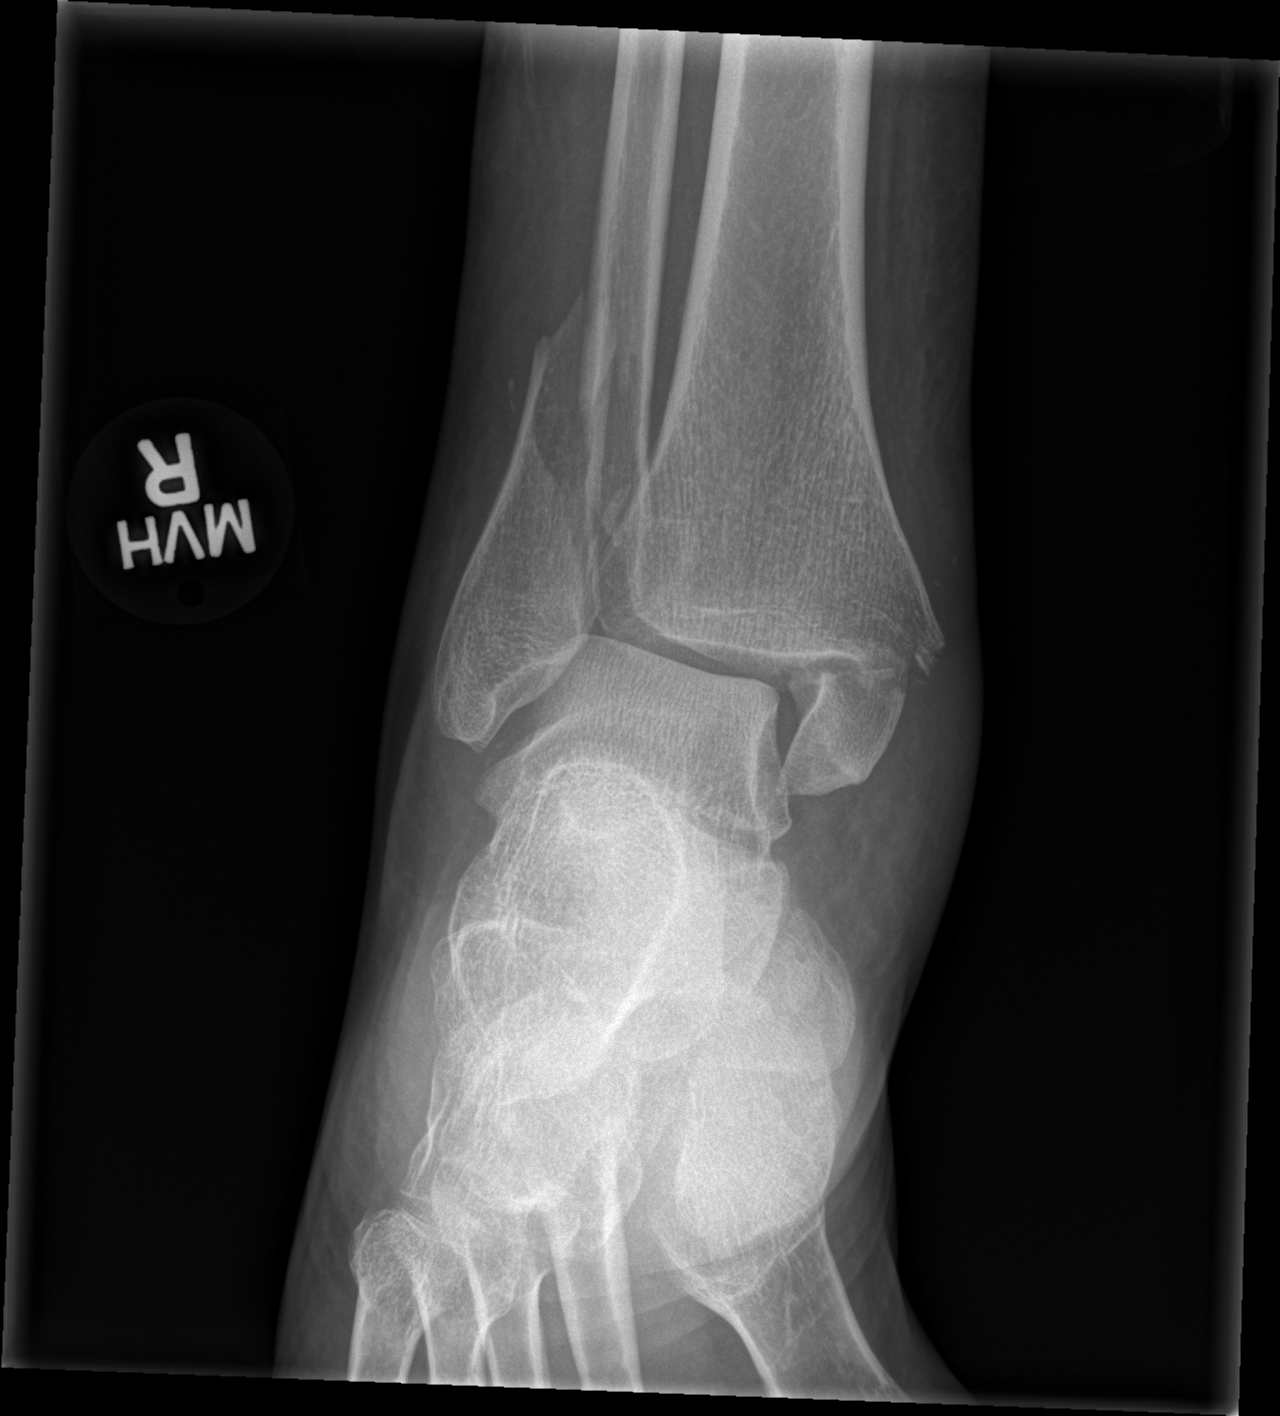

[x ankle obl right]
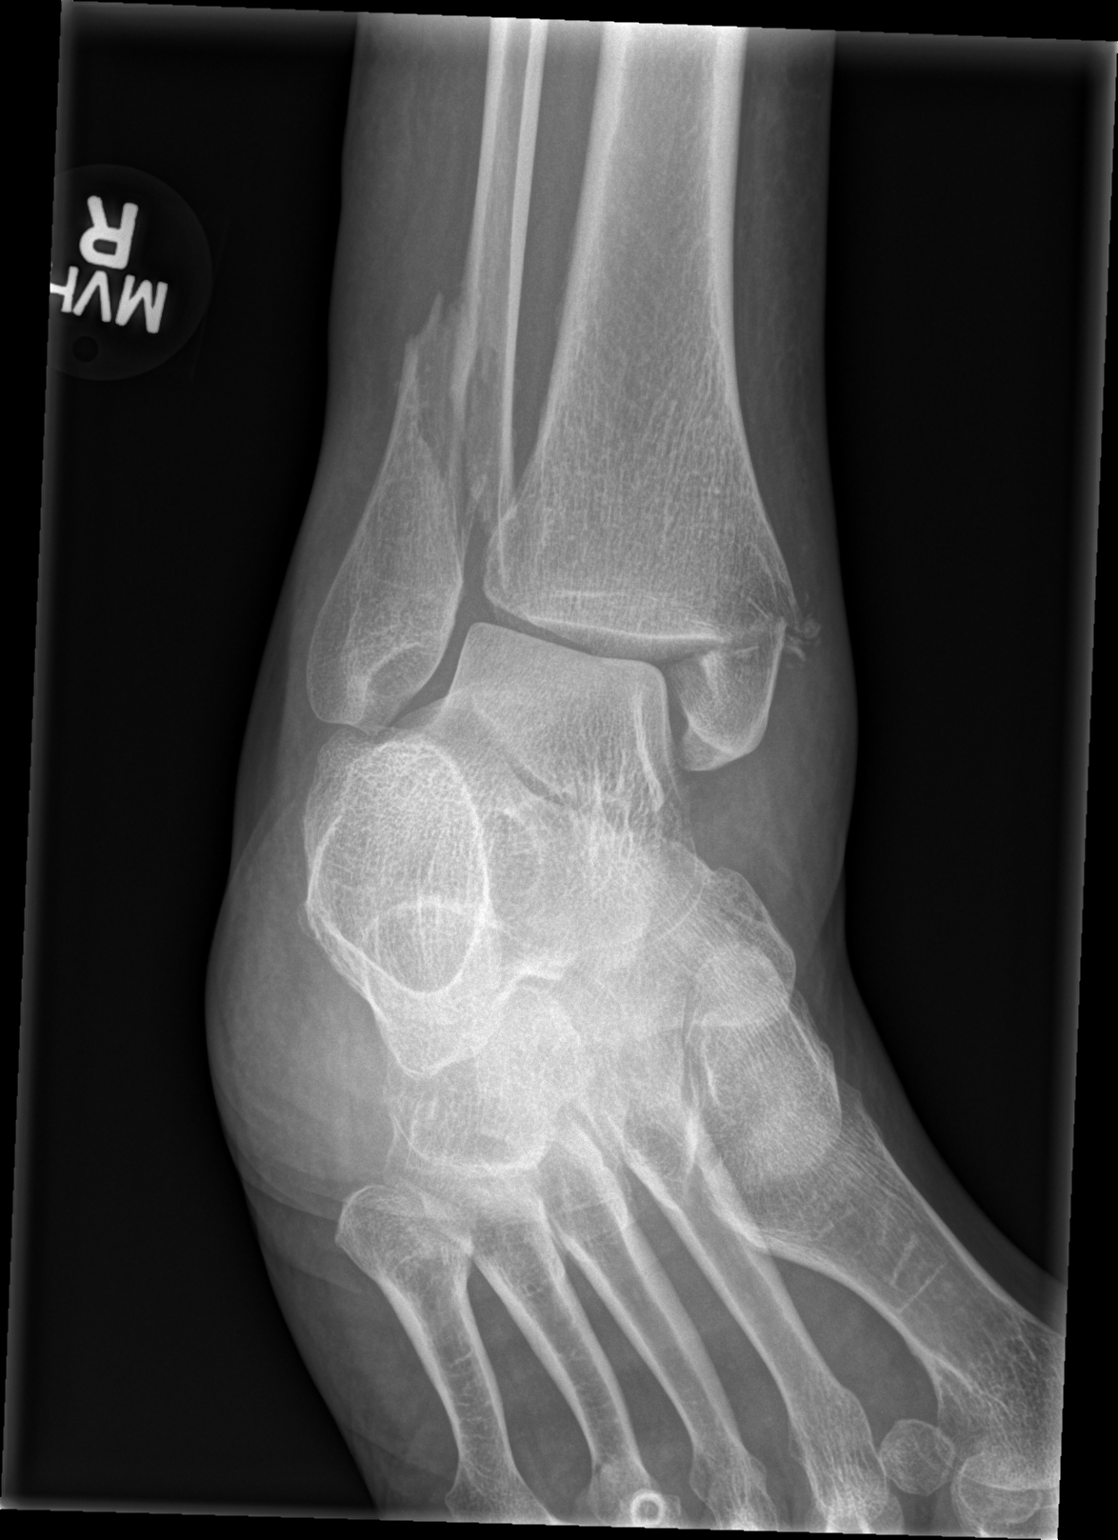

[3 of 3 positions shown; findings below may reference images not displayed]

FINDINGS: Bones demineralized.

Transverse comminuted fracture the medial malleolus, displaced
laterally.

Oblique distal RIGHT fibular metadiaphyseal fracture displaced
posteriorly and laterally.

Mild lateral subluxation of talus at tibiotalar joint.

Small bone fragment is seen on the lateral view superimposed with
the posterior margin of the distal tibia, cannot exclude posterior
tibial fracture.

Tarsals intact.

Regional soft tissue swelling.
IMPRESSION: Displaced RIGHT medial and lateral malleolar fractures as above.

Questionable fracture of the distal posterior tibia.

## 2018-01-27 ENCOUNTER — Ambulatory Visit: Payer: BC Managed Care – PPO | Admitting: Internal Medicine

## 2018-01-27 ENCOUNTER — Encounter: Payer: Self-pay | Admitting: Internal Medicine

## 2018-01-27 ENCOUNTER — Encounter

## 2018-01-27 VITALS — BP 158/100 | HR 111 | Ht 67.0 in | Wt 179.8 lb

## 2018-01-27 DIAGNOSIS — Z13220 Encounter for screening for lipoid disorders: Secondary | ICD-10-CM

## 2018-01-27 DIAGNOSIS — Z131 Encounter for screening for diabetes mellitus: Secondary | ICD-10-CM | POA: Insufficient documentation

## 2018-01-27 DIAGNOSIS — Z79899 Other long term (current) drug therapy: Secondary | ICD-10-CM | POA: Diagnosis not present

## 2018-01-27 DIAGNOSIS — Z1329 Encounter for screening for other suspected endocrine disorder: Secondary | ICD-10-CM

## 2018-01-27 DIAGNOSIS — I10 Essential (primary) hypertension: Secondary | ICD-10-CM | POA: Diagnosis not present

## 2018-01-27 MED ORDER — OLMESARTAN MEDOXOMIL-HCTZ 40-12.5 MG PO TABS
1.0000 | ORAL_TABLET | Freq: Every day | ORAL | 5 refills | Status: DC
Start: 2018-01-27 — End: 2018-05-26

## 2018-01-27 NOTE — Patient Instructions (Addendum)
Your physician has recommended you make the following change in your medication: START benicar-hct 40-12.5mg  daily  Your physician recommends that you return for lab work in about 1 week. Please come to Dr. Lysbeth Penner office for this. You will need to fast (nothing to eat/drink after midnight - water is OK)  Your physician recommends that you schedule a follow-up appointment in: 4-6 weeks with Dr. Debara Pickett.

## 2018-01-27 NOTE — Progress Notes (Signed)
OFFICE NOTE  Chief Complaint:  Hypertension, establish cardiologist  Primary Care Physician: No primary care provider on file.  HPI:  [name] is a 57 y.o. female with a past medial history significant for hypertension, however she is on no treatment.  In fact she does not go to doctors regularly.  She occasionally has been in urgent care.  She has not had any recent lab work.  She does not even follow with a GYN.  Her mother is a patient of mine.  She first noted hypertension back in the 1970s, but says her blood pressures been up and down since then.  She has not been on any treatment.  Recently she was encouraged to make an appointment for treatment of blood pressure.  Pressure today was 158/100.  The smears some of her blood pressures that she is tested at home on her blood pressure cuff.  She does have a significant amount of anxiety today and I think this is what kept her from coming to the doctor.  She denies chest pain or worsening shortness of breath.  PMHx:  Past Medical History:  Diagnosis Date  . Hypertension     Past Surgical History:  Procedure Laterality Date  . ANKLE SURGERY      FAMHx:  Family History  Problem Relation Age of Onset  . Hypertension Mother   . Heart disease Father   . Stroke Father   . Kidney disease Father   . Hypertension Sister   . Hypertension Maternal Grandmother   . Stroke Maternal Grandfather   . Diabetes Paternal Grandmother     SOCHx:   reports that she has never smoked. She has never used smokeless tobacco. She reports that she does not drink alcohol or use drugs.  ALLERGIES:  No Known Allergies  ROS: Pertinent items noted in HPI and remainder of comprehensive ROS otherwise negative.  HOME MEDS: Current Outpatient Medications on File Prior to Visit  Medication Sig Dispense Refill  . Cholecalciferol (VITAMIN D3) 1000 units CAPS Take 1 capsule by mouth daily.    . Cyanocobalamin (B-12 IJ) Inject as directed every 30  (thirty) days.    . Magnesium 500 MG CAPS Take 1 capsule by mouth daily.     No current facility-administered medications on file prior to visit.     LABS/IMAGING: No results found for this or any previous visit (from the past 48 hour(s)). No results found.  LIPID PANEL: No results found for: CHOL, TRIG, HDL, CHOLHDL, VLDL, LDLCALC, LDLDIRECT   WEIGHTS: Wt Readings from Last 3 Encounters:  01/27/18 179 lb 12.8 oz (81.6 kg)    VITALS: BP (!) 158/100   Pulse (!) 111   Ht 5\' 7"  (1.702 m)   Wt 179 lb 12.8 oz (81.6 kg)   BMI 28.16 kg/m   EXAM: General appearance: alert and no distress Neck: no carotid bruit, no JVD and thyroid not enlarged, symmetric, no tenderness/mass/nodules Lungs: clear to auscultation bilaterally Heart: Regular tachycardia, no murmur Abdomen: soft, non-tender; bowel sounds normal; no masses,  no organomegaly Extremities: extremities normal, atraumatic, no cyanosis or edema Pulses: 2+ and symmetric Skin: Skin color, texture, turgor normal. No rashes or lesions Neurologic: Grossly normal Psych: Pleasant  EKG: Sinus tachycardia at 111- personally reviewed  ASSESSMENT: 1. Uncontrolled hypertension  PLAN: 1.   Ms. Riester has uncontrolled hypertension.  This is been a long-standing problem for possibly even 50 years.  Is not clear why she did not seek medical care for this, since  she knew that her blood pressure was elevated and her mother has a history of hypertension.  Nonetheless she is willing to start treatment.  I recommend Benicar HCTZ 40/12.5 mg daily.  Will bring her back for repeat blood pressure check in 4 to 6 weeks.  We will get lab work including conference of metabolic profile, TSH, fasting lipid profile, hemoglobin A1c and CBC in 1 week.  Pixie Casino, MD, [facility], [place] Director of the [facility] Diplomate of the American Board of Clinical  Lipidology Attending Cardiologist  Direct Dial: [phone]  Fax: [phone]  Website:  [email or web address] [email or web address] 01/27/2018, 1:51 PM

## 2018-03-16 ENCOUNTER — Ambulatory Visit: Payer: BC Managed Care – PPO | Admitting: Internal Medicine

## 2018-05-26 ENCOUNTER — Other Ambulatory Visit: Payer: Self-pay | Admitting: Internal Medicine

## 2018-05-26 DIAGNOSIS — I10 Essential (primary) hypertension: Secondary | ICD-10-CM

## 2018-06-22 ENCOUNTER — Other Ambulatory Visit: Payer: Self-pay | Admitting: Internal Medicine

## 2018-06-22 DIAGNOSIS — I10 Essential (primary) hypertension: Secondary | ICD-10-CM

## 2018-10-30 ENCOUNTER — Other Ambulatory Visit: Payer: Self-pay | Admitting: Internal Medicine

## 2018-10-30 DIAGNOSIS — I10 Essential (primary) hypertension: Secondary | ICD-10-CM

## 2018-11-01 NOTE — Telephone Encounter (Signed)
Rx(s) sent to pharmacy electronically.  

## 2019-02-27 ENCOUNTER — Other Ambulatory Visit: Payer: Self-pay | Admitting: Internal Medicine

## 2019-02-27 DIAGNOSIS — I10 Essential (primary) hypertension: Secondary | ICD-10-CM

## 2019-05-21 ENCOUNTER — Other Ambulatory Visit: Payer: Self-pay | Admitting: Internal Medicine

## 2019-05-21 DIAGNOSIS — I10 Essential (primary) hypertension: Secondary | ICD-10-CM

## 2019-06-27 ENCOUNTER — Other Ambulatory Visit: Payer: Self-pay | Admitting: Internal Medicine

## 2019-06-27 DIAGNOSIS — I10 Essential (primary) hypertension: Secondary | ICD-10-CM

## 2019-08-01 ENCOUNTER — Other Ambulatory Visit: Payer: Self-pay | Admitting: Internal Medicine

## 2019-08-01 DIAGNOSIS — I10 Essential (primary) hypertension: Secondary | ICD-10-CM

## 2019-08-29 ENCOUNTER — Other Ambulatory Visit: Payer: Self-pay | Admitting: Internal Medicine

## 2019-08-29 DIAGNOSIS — I10 Essential (primary) hypertension: Secondary | ICD-10-CM

## 2019-09-28 ENCOUNTER — Other Ambulatory Visit: Payer: Self-pay | Admitting: Internal Medicine

## 2019-09-28 DIAGNOSIS — I10 Essential (primary) hypertension: Secondary | ICD-10-CM

## 2019-09-29 NOTE — Telephone Encounter (Signed)
Rx request sent to pharmacy.  

## 2019-12-27 ENCOUNTER — Other Ambulatory Visit (HOSPITAL_COMMUNITY): Payer: Self-pay | Admitting: Nurse Practitioner

## 2022-02-27 DIAGNOSIS — C801 Malignant (primary) neoplasm, unspecified: Secondary | ICD-10-CM

## 2022-02-27 HISTORY — DX: Malignant (primary) neoplasm, unspecified: C80.1

## 2022-03-22 ENCOUNTER — Inpatient Hospital Stay (HOSPITAL_COMMUNITY)
Admission: EM | Admit: 2022-03-22 | Discharge: 2022-03-27 | DRG: 598 | Disposition: A | Discharge status: Home / Self Care | Payer: BC Managed Care – PPO | Attending: Internal Medicine | Admitting: Internal Medicine

## 2022-03-22 ENCOUNTER — Emergency Department (HOSPITAL_COMMUNITY): Payer: BC Managed Care – PPO

## 2022-03-22 ENCOUNTER — Encounter (HOSPITAL_COMMUNITY): Payer: Self-pay

## 2022-03-22 DIAGNOSIS — Z823 Family history of stroke: Secondary | ICD-10-CM

## 2022-03-22 DIAGNOSIS — J90 Pleural effusion, not elsewhere classified: Secondary | ICD-10-CM

## 2022-03-22 DIAGNOSIS — I96 Gangrene, not elsewhere classified: Secondary | ICD-10-CM | POA: Diagnosis present

## 2022-03-22 DIAGNOSIS — C50911 Malignant neoplasm of unspecified site of right female breast: Secondary | ICD-10-CM | POA: Diagnosis not present

## 2022-03-22 DIAGNOSIS — Z8249 Family history of ischemic heart disease and other diseases of the circulatory system: Secondary | ICD-10-CM

## 2022-03-22 DIAGNOSIS — I959 Hypotension, unspecified: Secondary | ICD-10-CM | POA: Diagnosis present

## 2022-03-22 DIAGNOSIS — D75838 Other thrombocytosis: Secondary | ICD-10-CM | POA: Diagnosis present

## 2022-03-22 DIAGNOSIS — R0602 Shortness of breath: Secondary | ICD-10-CM | POA: Diagnosis not present

## 2022-03-22 DIAGNOSIS — D75839 Thrombocytosis, unspecified: Secondary | ICD-10-CM

## 2022-03-22 DIAGNOSIS — Z6821 Body mass index (BMI) 21.0-21.9, adult: Secondary | ICD-10-CM

## 2022-03-22 DIAGNOSIS — D638 Anemia in other chronic diseases classified elsewhere: Secondary | ICD-10-CM | POA: Diagnosis present

## 2022-03-22 DIAGNOSIS — J9601 Acute respiratory failure with hypoxia: Secondary | ICD-10-CM

## 2022-03-22 DIAGNOSIS — I10 Essential (primary) hypertension: Secondary | ICD-10-CM | POA: Diagnosis present

## 2022-03-22 DIAGNOSIS — E440 Moderate protein-calorie malnutrition: Secondary | ICD-10-CM | POA: Diagnosis present

## 2022-03-22 DIAGNOSIS — L0390 Cellulitis, unspecified: Secondary | ICD-10-CM

## 2022-03-22 DIAGNOSIS — J910 Malignant pleural effusion: Secondary | ICD-10-CM | POA: Diagnosis present

## 2022-03-22 DIAGNOSIS — I890 Lymphedema, not elsewhere classified: Secondary | ICD-10-CM | POA: Diagnosis present

## 2022-03-22 DIAGNOSIS — C7981 Secondary malignant neoplasm of breast: Principal | ICD-10-CM

## 2022-03-22 DIAGNOSIS — L03313 Cellulitis of chest wall: Secondary | ICD-10-CM | POA: Diagnosis present

## 2022-03-22 DIAGNOSIS — G893 Neoplasm related pain (acute) (chronic): Secondary | ICD-10-CM | POA: Diagnosis present

## 2022-03-22 DIAGNOSIS — D509 Iron deficiency anemia, unspecified: Secondary | ICD-10-CM | POA: Diagnosis present

## 2022-03-22 DIAGNOSIS — C799 Secondary malignant neoplasm of unspecified site: Secondary | ICD-10-CM | POA: Diagnosis present

## 2022-03-22 DIAGNOSIS — C50919 Malignant neoplasm of unspecified site of unspecified female breast: Secondary | ICD-10-CM

## 2022-03-22 DIAGNOSIS — D649 Anemia, unspecified: Secondary | ICD-10-CM

## 2022-03-22 DIAGNOSIS — D508 Other iron deficiency anemias: Secondary | ICD-10-CM

## 2022-03-22 DIAGNOSIS — C7951 Secondary malignant neoplasm of bone: Secondary | ICD-10-CM | POA: Diagnosis present

## 2022-03-22 DIAGNOSIS — R5381 Other malaise: Secondary | ICD-10-CM | POA: Diagnosis present

## 2022-03-22 DIAGNOSIS — Z833 Family history of diabetes mellitus: Secondary | ICD-10-CM

## 2022-03-22 DIAGNOSIS — N179 Acute kidney failure, unspecified: Secondary | ICD-10-CM | POA: Diagnosis present

## 2022-03-22 DIAGNOSIS — K5909 Other constipation: Secondary | ICD-10-CM

## 2022-03-22 DIAGNOSIS — K5900 Constipation, unspecified: Secondary | ICD-10-CM | POA: Diagnosis present

## 2022-03-22 DIAGNOSIS — Z20822 Contact with and (suspected) exposure to covid-19: Secondary | ICD-10-CM | POA: Diagnosis present

## 2022-03-22 DIAGNOSIS — Z23 Encounter for immunization: Secondary | ICD-10-CM

## 2022-03-22 DIAGNOSIS — C787 Secondary malignant neoplasm of liver and intrahepatic bile duct: Secondary | ICD-10-CM | POA: Diagnosis present

## 2022-03-22 DIAGNOSIS — E872 Acidosis, unspecified: Secondary | ICD-10-CM | POA: Diagnosis present

## 2022-03-22 HISTORY — DX: Malignant neoplasm of unspecified site of right female breast: C50.911

## 2022-03-22 LAB — CBC WITH DIFFERENTIAL/PLATELET
Abs Immature Granulocytes: 0.05 10*3/uL (ref 0.00–0.07)
Basophils Absolute: 0.1 10*3/uL (ref 0.0–0.1)
Basophils Relative: 1 %
Eosinophils Absolute: 0 10*3/uL (ref 0.0–0.5)
Eosinophils Relative: 0 %
HCT: 24.3 % — ABNORMAL LOW (ref 36.0–46.0)
Hemoglobin: 7.5 g/dL — ABNORMAL LOW (ref 12.0–15.0)
Immature Granulocytes: 1 %
Lymphocytes Relative: 7 %
Lymphs Abs: 0.7 10*3/uL (ref 0.7–4.0)
MCH: 21.8 pg — ABNORMAL LOW (ref 26.0–34.0)
MCHC: 30.9 g/dL (ref 30.0–36.0)
MCV: 70.6 fL — ABNORMAL LOW (ref 80.0–100.0)
Monocytes Absolute: 0.4 10*3/uL (ref 0.1–1.0)
Monocytes Relative: 4 %
Neutro Abs: 8.6 10*3/uL — ABNORMAL HIGH (ref 1.7–7.7)
Neutrophils Relative %: 87 %
Platelets: 629 10*3/uL — ABNORMAL HIGH (ref 150–400)
RBC: 3.44 MIL/uL — ABNORMAL LOW (ref 3.87–5.11)
RDW: 14.9 % (ref 11.5–15.5)
WBC: 9.8 10*3/uL (ref 4.0–10.5)
nRBC: 0 % (ref 0.0–0.2)

## 2022-03-22 LAB — FOLATE: Folate: 7.7 ng/mL (ref >5.9)

## 2022-03-22 LAB — VITAMIN B12: Vitamin B-12: 553 pg/mL (ref 180–914)

## 2022-03-22 LAB — LACTIC ACID, PLASMA: Lactic Acid, Venous: 3.1 mmol/L (ref 0.5–1.9)

## 2022-03-22 LAB — LACTATE DEHYDROGENASE: LDH: 217 U/L — ABNORMAL HIGH (ref 98–192)

## 2022-03-22 LAB — BLOOD GAS, VENOUS
Acid-Base Excess: 0.5 mmol/L (ref 0.0–2.0)
Bicarbonate: 24.3 mmol/L (ref 20.0–28.0)
O2 Saturation: 76.1 %
Patient temperature: 37
pCO2, Ven: 35 mmHg — ABNORMAL LOW (ref 44–60)
pH, Ven: 7.45 — ABNORMAL HIGH (ref 7.25–7.43)
pO2, Ven: 45 mmHg (ref 32–45)

## 2022-03-22 LAB — BASIC METABOLIC PANEL
Anion gap: 10 (ref 5–15)
BUN: 29 mg/dL — ABNORMAL HIGH (ref 8–23)
CO2: 23 mmol/L (ref 22–32)
Calcium: 8.8 mg/dL — ABNORMAL LOW (ref 8.9–10.3)
Chloride: 108 mmol/L (ref 98–111)
Creatinine, Ser: 1.46 mg/dL — ABNORMAL HIGH (ref 0.44–1.00)
GFR, Estimated: 41 mL/min — ABNORMAL LOW (ref >60)
Glucose, Bld: 133 mg/dL — ABNORMAL HIGH (ref 70–99)
Potassium: 3.7 mmol/L (ref 3.5–5.1)
Sodium: 141 mmol/L (ref 135–145)

## 2022-03-22 LAB — IRON AND TIBC
Iron: 14 ug/dL — ABNORMAL LOW (ref 28–170)
Saturation Ratios: 6 % — ABNORMAL LOW (ref 10.4–31.8)
TIBC: 220 ug/dL — ABNORMAL LOW (ref 250–450)
UIBC: 206 ug/dL

## 2022-03-22 LAB — RETICULOCYTES
Immature Retic Fract: 20.2 % — ABNORMAL HIGH (ref 2.3–15.9)
RBC.: 2.89 MIL/uL — ABNORMAL LOW (ref 3.87–5.11)
Retic Count, Absolute: 47.4 10*3/uL (ref 19.0–186.0)
Retic Ct Pct: 1.6 % (ref 0.4–3.1)

## 2022-03-22 LAB — RESP PANEL BY RT-PCR (FLU A&B, COVID) ARPGX2
Influenza A by PCR: NEGATIVE
Influenza B by PCR: NEGATIVE
SARS Coronavirus 2 by RT PCR: NEGATIVE

## 2022-03-22 LAB — FERRITIN: Ferritin: 65 ng/mL (ref 11–307)

## 2022-03-22 LAB — BRAIN NATRIURETIC PEPTIDE: B Natriuretic Peptide: 34.4 pg/mL (ref 0.0–100.0)

## 2022-03-22 MED ORDER — FERROUS SULFATE 325 (65 FE) MG PO TABS: 325.0000 mg | ORAL_TABLET | Freq: Every day | ORAL | Status: DC

## 2022-03-22 MED ORDER — ONDANSETRON HCL 4 MG/2ML IJ SOLN
4.0000 mg | Freq: Once | INTRAMUSCULAR | Status: AC
  Administered 2022-03-22: 4 mg via INTRAVENOUS
  Filled 2022-03-22: qty 2

## 2022-03-22 MED ORDER — VANCOMYCIN HCL 1500 MG/300ML IV SOLN
1500.0000 mg | Freq: Once | INTRAVENOUS | Status: AC
  Administered 2022-03-22: 1500 mg via INTRAVENOUS
  Filled 2022-03-22: qty 300

## 2022-03-22 MED ORDER — IOHEXOL 350 MG/ML SOLN
80.0000 mL | Freq: Once | INTRAVENOUS | Status: AC | PRN
  Administered 2022-03-22: 130 mL via INTRAVENOUS

## 2022-03-22 MED ORDER — MORPHINE SULFATE (PF) 2 MG/ML IV SOLN
1.0000 mg | INTRAVENOUS | Status: DC | PRN
  Administered 2022-03-23 – 2022-03-24 (×5): 1 mg via INTRAVENOUS
  Filled 2022-03-22 (×5): qty 1

## 2022-03-22 MED ORDER — SODIUM CHLORIDE 0.9 % IV SOLN: INTRAVENOUS | Status: AC

## 2022-03-22 MED ORDER — HYDROMORPHONE HCL 1 MG/ML IJ SOLN
1.0000 mg | Freq: Once | INTRAMUSCULAR | Status: AC
  Administered 2022-03-22: 1 mg via INTRAVENOUS
  Filled 2022-03-22: qty 1

## 2022-03-22 MED ORDER — SODIUM CHLORIDE 0.9 % IV BOLUS
1000.0000 mL | Freq: Once | INTRAVENOUS | Status: AC
Start: 2022-03-22 — End: 2022-03-22
  Administered 2022-03-22: 1000 mL via INTRAVENOUS

## 2022-03-22 NOTE — ED Triage Notes (Signed)
Pt presents with c/o shortness of breath for approx one week. Pt also reports she has a wound on her breasts. Upon inspection, pt has a large wound on her right breast, open tissue with pus and drainage, left breast is severely swollen and tender to touch with redness. Pt reports she is concerned she has breast cancer, has not been seen for the wound. Pt also has significant swelling on her right arm from at least mid arm to fingertips.

## 2022-03-22 NOTE — ED Provider Notes (Signed)
[facility]-EMERGENCY DEPT Provider Note   CSN: [id number] Arrival date & time: 03/22/22  1632     History  Chief Complaint  Patient presents with   Shortness of Breath   Wound Check    [name] is a 61 y.o. female.  Patient as above with significant medical history as below, including hypertension who presents to the ED with complaint of difficulty breathing, breast abnormality.  Patient reports over the past 2 years she is experience increasing pain and discomfort to her breast, right initially then the left.  She noticed a wound forming on her right breast around 12 months ago that has been gradually worsening, there is pain ongoing drainage, purulent from the wound during this time.  She did have increased swelling and pain to her left breast.  Pain and swelling to her right arm.  No fevers or chills but is having significant exertional dyspnea with minimal exertion or ambulation.  More pronounced over the past 2 weeks.  Non productive Cough.  No nausea or vomiting.  No headaches, vision changes numbness or tingling, no abdominal pain, no change in bowel or bladder function.  No change in stool caliber, no melena or BRBPR.  No hematuria.     Past Medical History:  Diagnosis Date   Hypertension     Past Surgical History:  Procedure Laterality Date   ANKLE SURGERY       The history is provided by the patient and a parent. No language interpreter was used.  Shortness of Breath Associated symptoms: chest pain and cough   Associated symptoms: no abdominal pain, no fever, no headaches, no rash and no vomiting   Wound Check Associated symptoms include chest pain and shortness of breath. Pertinent negatives include no abdominal pain and no headaches.       Home Medications Prior to Admission medications   Medication Sig Start Date End Date Taking? Authorizing Provider  Multiple Vitamins-Minerals (MULTIVITAMIN ADULT EXTRA C) CHEW Chew 3 tablets by  mouth daily.   Yes [provider]  olmesartan-hydrochlorothiazide (BENICAR HCT) 40-12.5 MG tablet Take 1 tablet by mouth daily. Please schedule appointment for refills. Patient not taking: Reported on 03/22/2022 09/29/19   Chrystie Nose, MD      Allergies    Patient has no known allergies.    Review of Systems   Review of Systems  Constitutional:  Positive for fatigue. Negative for chills and fever.  HENT:  Negative for facial swelling and trouble swallowing.   Eyes:  Negative for photophobia and visual disturbance.  Respiratory:  Positive for cough and shortness of breath.   Cardiovascular:  Positive for chest pain. Negative for palpitations.  Gastrointestinal:  Negative for abdominal pain, nausea and vomiting.  Endocrine: Negative for polydipsia and polyuria.  Genitourinary:  Negative for difficulty urinating and hematuria.  Musculoskeletal:  Negative for gait problem and joint swelling.  Skin:  Negative for pallor and rash.  Neurological:  Positive for weakness. Negative for syncope and headaches.  Psychiatric/Behavioral:  Negative for agitation and confusion.     Physical Exam Updated Vital Signs BP 109/67   Pulse 93   Temp 98.4 F (36.9 C) (Oral)   Resp 19   SpO2 90%  Physical Exam Vitals and nursing note reviewed.  Constitutional:      General: She is not in acute distress.    Appearance: Normal appearance. She is well-developed.  HENT:     Head: Normocephalic and atraumatic. No right periorbital erythema or  left periorbital erythema.     Jaw: There is normal jaw occlusion. No trismus.     Right Ear: External ear normal.     Left Ear: External ear normal.     Nose: Nose normal.     Mouth/Throat:     Mouth: Mucous membranes are moist.  Eyes:     General: No scleral icterus.       Right eye: No discharge.        Left eye: No discharge.  Cardiovascular:     Rate and Rhythm: Normal rate and regular rhythm.     Pulses: Normal pulses.     Heart sounds:  Normal heart sounds.  Pulmonary:     Effort: Pulmonary effort is normal. No respiratory distress.     Breath sounds: Normal breath sounds.  Chest:     Comments: Large wound to right breast.  Malodorous, purulent drainage.  Left side breast tissue is erythematous, indurated.  Significant lymphedema to the right upper extremity.  Radial pulses intact bilateral.  Neurovascular intact bilateral upper extremities. Abdominal:     General: Abdomen is flat.     Tenderness: There is no abdominal tenderness. There is no guarding or rebound.  Musculoskeletal:        General: Normal range of motion.     Cervical back: Normal range of motion.     Right lower leg: No edema.     Left lower leg: No edema.  Skin:    General: Skin is warm and dry.     Capillary Refill: Capillary refill takes less than 2 seconds.  Neurological:     Mental Status: She is alert and oriented to person, place, and time.     GCS: GCS eye subscore is 4. GCS verbal subscore is 5. GCS motor subscore is 6.  Psychiatric:        Mood and Affect: Mood normal.        Behavior: Behavior normal.          ED Results / Procedures / Treatments   Labs (all labs ordered are listed, but only abnormal results are displayed) Labs Reviewed  BASIC METABOLIC PANEL - Abnormal; Notable for the following components:      Result Value   Glucose, Bld 133 (*)    BUN 29 (*)    Creatinine, Ser 1.46 (*)    Calcium 8.8 (*)    GFR, Estimated 41 (*)    All other components within normal limits  CBC WITH DIFFERENTIAL/PLATELET - Abnormal; Notable for the following components:   RBC 3.44 (*)    Hemoglobin 7.5 (*)    HCT 24.3 (*)    MCV 70.6 (*)    MCH 21.8 (*)    Platelets 629 (*)    Neutro Abs 8.6 (*)    All other components within normal limits  LACTIC ACID, PLASMA - Abnormal; Notable for the following components:   Lactic Acid, Venous 3.1 (*)    All other components within normal limits  BLOOD GAS, VENOUS - Abnormal; Notable for the  following components:   pH, Ven 7.45 (*)    pCO2, Ven 35 (*)    All other components within normal limits  LACTATE DEHYDROGENASE - Abnormal; Notable for the following components:   LDH 217 (*)    All other components within normal limits  IRON AND TIBC - Abnormal; Notable for the following components:   Iron 14 (*)    TIBC 220 (*)    Saturation Ratios 6 (*)  All other components within normal limits  RETICULOCYTES - Abnormal; Notable for the following components:   RBC. 2.89 (*)    Immature Retic Fract 20.2 (*)    All other components within normal limits  RESP PANEL BY RT-PCR (FLU A&B, COVID) ARPGX2  CULTURE, BLOOD (ROUTINE X 2)  CULTURE, BLOOD (ROUTINE X 2)  BRAIN NATRIURETIC PEPTIDE  VITAMIN B12  FOLATE  FERRITIN  CA 125  CEA  HIV ANTIBODY (ROUTINE TESTING W REFLEX)  CBC  COMPREHENSIVE METABOLIC PANEL  OCCULT BLOOD X 1 CARD TO LAB, STOOL  LACTIC ACID, PLASMA  TYPE AND SCREEN    EKG None  Radiology CT ABDOMEN PELVIS W CONTRAST  Result Date: 03/22/2022 CLINICAL DATA:  Chest mass, metastatic evaluation * Tracking Code: BO * EXAM: CT ABDOMEN AND PELVIS WITH CONTRAST TECHNIQUE: Multidetector CT imaging of the abdomen and pelvis was performed using the standard protocol following bolus administration of intravenous contrast. RADIATION DOSE REDUCTION: This exam was performed according to the departmental dose-optimization program which includes automated exposure control, adjustment of the mA and/or kV according to patient size and/or use of iterative reconstruction technique. CONTRAST:  OMNIPAQUE IOHEXOL 350 MG/ML SOLN COMPARISON:  None Available. FINDINGS: Lower chest: Please see separately reported examination of the chest. Hepatobiliary: Multiple low-attenuation lesions scattered throughout the liver, the largest of which appear to be fluid attenuation simple cysts or hemangiomata, others too small to characterize. No gallstones, gallbladder wall thickening, or biliary  dilatation. Pancreas: Unremarkable. No pancreatic ductal dilatation or surrounding inflammatory changes. Spleen: Normal in size without significant abnormality. Adrenals/Urinary Tract: Adrenal glands are unremarkable. Kidneys are normal, without renal calculi, solid lesion, or hydronephrosis. Bladder is unremarkable. Stomach/Bowel: Stomach is within normal limits. Appendix appears normal. No evidence of bowel wall thickening, distention, or inflammatory changes. Vascular/Lymphatic: Superficial venous collateralization about the ventral abdomen, likely secondary to central venous obstruction in the chest. No enlarged abdominal or pelvic lymph nodes. Reproductive: No mass or other significant abnormality. Other: No abdominal wall hernia or abnormality.  Anasarca. Musculoskeletal: Lytic lesion of left aspect of the L4 vertebral body and left pedicle. IMPRESSION: 1. Multiple low-attenuation lesions scattered throughout the liver, the largest of which appear to be fluid attenuation simple cysts or hemangiomata, others too small to characterize. Hepatic metastases not excluded. 2. Lytic lesion of left aspect of the L4 vertebral body and left pedicle, consistent with osseous metastatic disease. 3. Superficial venous collateralization about the ventral abdomen, likely secondary to central venous obstruction in the chest. 4. Anasarca. Electronically Signed   By: Jearld Lesch M.D.   On: 03/22/2022 20:04   CT Angio Chest PE W and/or Wo Contrast  Result Date: 03/22/2022 CLINICAL DATA:  PE suspected, metastatic disease, likely breast * Tracking Code: BO * EXAM: CT ANGIOGRAPHY CHEST WITH CONTRAST TECHNIQUE: Multidetector CT imaging of the chest was performed using the standard protocol during bolus administration of intravenous contrast. Multiplanar CT image reconstructions and MIPs were obtained to evaluate the vascular anatomy. RADIATION DOSE REDUCTION: This exam was performed according to the departmental  dose-optimization program which includes automated exposure control, adjustment of the mA and/or kV according to patient size and/or use of iterative reconstruction technique. CONTRAST:  OMNIPAQUE IOHEXOL 350 MG/ML SOLN COMPARISON:  None Available. FINDINGS: Cardiovascular: Examination for pulmonary embolism by breath motion artifact throughout and atelectasis of the right lung; within this limitation, no evidence pulmonary embolism through the proximal segmental pulmonary arterial level. Normal heart size. No pericardial effusion. Mediastinum/Nodes: No enlarged mediastinal, hilar, or  axillary lymph nodes. Thyroid gland, trachea, and esophagus demonstrate no significant findings. Lungs/Pleura: Large right pleural effusion associated atelectasis or consolidation. Upper Abdomen: Please see separately reported examination of the abdomen and pelvis. Musculoskeletal: Bulky, confluent, ulcerated mass of the right breast, with very extensive skin thickening nodularity extending across essentially the entire right chest wall and into the underlying musculature, as well as contralaterally to involve the left breast with severe skin thickening (series 4, image 69). Lytic osseous lesions, for example of the right fifth rib (series 4, image 51) and sternal body (series 7, image 83). Lytic pathologic fracture deformity of the T3 vertebral body (series 7, image 83). Review of the MIP images confirms the above findings. IMPRESSION: 1. Examination for pulmonary embolism limited by breath motion artifact throughout and atelectasis of the right lung; within this limitation, no evidence of pulmonary embolism through the proximal segmental pulmonary arterial level. 2. Bulky, confluent, ulcerated mass of the right breast, with very extensive skin thickening nodularity extending across essentially the entire right chest wall and into the underlying musculature, as well as contralaterally to involve the left breast with severe skin  thickening. 3. Lytic osseous lesions, for example of the right fifth rib and sternal body. Lytic pathologic fracture deformity of the T3 vertebral body. 4. Large right pleural effusion and associated atelectasis or consolidation, presumed malignant however without directly visualized nodularity or pleural thickening. 5. Findings are consistent with advanced primary breast malignancy and associated metastatic disease. Electronically Signed   By: Jearld Lesch M.D.   On: 03/22/2022 19:58   CT Head Wo Contrast  Result Date: 03/22/2022 CLINICAL DATA:  Evaluate for brain metastases. EXAM: CT HEAD WITHOUT CONTRAST TECHNIQUE: Contiguous axial images were obtained from the base of the skull through the vertex without intravenous contrast. RADIATION DOSE REDUCTION: This exam was performed according to the departmental dose-optimization program which includes automated exposure control, adjustment of the mA and/or kV according to patient size and/or use of iterative reconstruction technique. COMPARISON:  None Available. FINDINGS: Brain: No evidence of acute infarction, hemorrhage, hydrocephalus, extra-axial collection or mass lesion/mass effect. Vascular: No hyperdense vessel or unexpected calcification. Skull: Normal. Negative for fracture or focal lesion. Sinuses/Orbits: No acute finding. Other: None. IMPRESSION: 1. No acute intracranial abnormalities.  Normal brain. Electronically Signed   By: Signa Kell M.D.   On: 03/22/2022 19:39    Procedures .Critical Care  Performed by: Sloan Leiter, DO Authorized by: Sloan Leiter, DO   Critical care provider statement:    Critical care time (minutes):  34   Critical care time was exclusive of:  Separately billable procedures and treating other patients   Critical care was necessary to treat or prevent imminent or life-threatening deterioration of the following conditions:  Respiratory failure   Critical care was time spent personally by me on the following  activities:  Development of treatment plan with patient or surrogate, discussions with consultants, evaluation of patient's response to treatment, examination of patient, ordering and review of laboratory studies, ordering and review of radiographic studies, ordering and performing treatments and interventions, pulse oximetry, re-evaluation of patient's condition, review of old charts and obtaining history from patient or surrogate   Care discussed with: admitting provider       Medications Ordered in ED Medications  ferrous sulfate tablet 325 mg (has no administration in time range)  0.9 %  sodium chloride infusion (has no administration in time range)  morphine (PF) 2 MG/ML injection 1 mg (has no administration in  time range)  HYDROmorphone (DILAUDID) injection 1 mg (1 mg Intravenous Given 03/22/22 1807)  ondansetron (ZOFRAN) injection 4 mg (4 mg Intravenous Given 03/22/22 1806)  sodium chloride 0.9 % bolus 1,000 mL (1,000 mLs Intravenous New Bag/Given 03/22/22 1807)  vancomycin (VANCOREADY) IVPB 1500 mg/300 mL (1,500 mg Intravenous New Bag/Given 03/22/22 1828)  iohexol (OMNIPAQUE) 350 MG/ML injection 80 mL (130 mLs Intravenous Contrast Given 03/22/22 1904)    ED Course/ Medical Decision Making/ A&P Clinical Course as of 03/22/22 2252  Sat Mar 22, 2022  2024 IMPRESSION: 1. Examination for pulmonary embolism limited by breath motion artifact throughout and atelectasis of the right lung; within this limitation, no evidence of pulmonary embolism through the proximal segmental pulmonary arterial level. 2. Bulky, confluent, ulcerated mass of the right breast, with very extensive skin thickening nodularity extending across essentially the entire right chest wall and into the underlying musculature, as well as contralaterally to involve the left breast with severe skin thickening. 3. Lytic osseous lesions, for example of the right fifth rib and sternal body. Lytic pathologic fracture deformity  of the T3 vertebral body. 4. Large right pleural effusion and associated atelectasis or consolidation, presumed malignant however without directly visualized nodularity or pleural thickening. 5. Findings are consistent with advanced primary breast malignancy and associated metastatic disease.    [SG]  2024 IMPRESSION: 1. Multiple low-attenuation lesions scattered throughout the liver, the largest of which appear to be fluid attenuation simple cysts or hemangiomata, others too small to characterize. Hepatic metastases not excluded. 2. Lytic lesion of left aspect of the L4 vertebral body and left pedicle, consistent with osseous metastatic disease. 3. Superficial venous collateralization about the ventral abdomen, likely secondary to central venous obstruction in the chest. 4. Anasarca.   [SG]  2127 Spoke with Dr Bertis Ruddy, will come see the Pt in the morning  D/w Dr Kendrick Fries with PCCM, will attempt pleural fluid drainage tonight or tomorrow AM, advised to call if pt decompensates or needs to drainage more urgently overnight  [SG]    Clinical Course User Index [SG] Sloan Leiter, DO                           Medical Decision Making Amount and/or Complexity of Data Reviewed Labs: ordered. Radiology: ordered.  Risk Prescription drug management. Decision regarding hospitalization.    CC: Chest wall pain, wound check, dyspnea  This patient presents to the Emergency Department for the above complaint. This involves an extensive number of treatment options and is a complaint that carries with it a high risk of complications and morbidity. Vital signs were reviewed. Serious etiologies considered.  In my evaluation of this patient's dyspnea my DDx includes, but is not limited to, pneumonia, pulmonary embolism, pneumothorax, pulmonary edema, metabolic acidosis, asthma, COPD, cardiac cause, anemia, anxiety, etc.   Record review:  Previous records obtained and reviewed  No recent  medical visits, did review PDMP  Additional history obtained from mother  Medical and surgical history as noted above.   Work up as above, notable for:  Labs & imaging results that were available during my care of the patient were visualized by me and considered in my medical decision making.   I ordered imaging studies which included CT head, ct pe, ct a/p. I visualized the imaging, interpreted images, and I agree with radiologist interpretation. Diffuse metastatic disease, large pleural effusion on the right, favor malignant effusion.  Multiple lytic lesions.  T3, L4, right fifth  rib, sternum.  Concern for diffuse hepatic metastatic disease.  Also bulky lesion essentially to her entire chest wall concerning for malignancy.  Anasarca.  Very high suspicion for advanced metastatic disease.  CT head without brain metastases.  Cardiac monitoring reviewed and interpreted personally which shows sinus tachycardia now NSR  Management: Large wound to her chest wall on the right, likely associated with metastatic lesion.  Start vancomycin for presumed cellulitis.  IV fluids, analgesics  ED Course: Clinical Course as of 03/22/22 2252  Sat Mar 22, 2022  2024 IMPRESSION: 1. Examination for pulmonary embolism limited by breath motion artifact throughout and atelectasis of the right lung; within this limitation, no evidence of pulmonary embolism through the proximal segmental pulmonary arterial level. 2. Bulky, confluent, ulcerated mass of the right breast, with very extensive skin thickening nodularity extending across essentially the entire right chest wall and into the underlying musculature, as well as contralaterally to involve the left breast with severe skin thickening. 3. Lytic osseous lesions, for example of the right fifth rib and sternal body. Lytic pathologic fracture deformity of the T3 vertebral body. 4. Large right pleural effusion and associated atelectasis or consolidation,  presumed malignant however without directly visualized nodularity or pleural thickening. 5. Findings are consistent with advanced primary breast malignancy and associated metastatic disease.    [SG]  2024 IMPRESSION: 1. Multiple low-attenuation lesions scattered throughout the liver, the largest of which appear to be fluid attenuation simple cysts or hemangiomata, others too small to characterize. Hepatic metastases not excluded. 2. Lytic lesion of left aspect of the L4 vertebral body and left pedicle, consistent with osseous metastatic disease. 3. Superficial venous collateralization about the ventral abdomen, likely secondary to central venous obstruction in the chest. 4. Anasarca.   [SG]  2127 Spoke with Dr Bertis Ruddy, will come see the Pt in the morning  D/w Dr Kendrick Fries with PCCM, will attempt pleural fluid drainage tonight or tomorrow AM, advised to call if pt decompensates or needs to drainage more urgently overnight  [SG]    Clinical Course User Index [SG] Sloan Leiter, DO     Reassessment:  Patient reports improvement to her discomfort following analgesics, heart rate has improved as well.  Admission was considered.   CBC with hemoglobin 7.5, unclear baseline.  Type and screen sent.  Recommend trending hemoglobin and if this worsens would recommend transfusion.  No source of bleeding identified on exam or imaging.  No melena or BRBPR, no hematemesis.  Hemoglobin baseline is unclear.  Concern for association with metastatic disease, possible symptomatic anemia given exertional dyspnea.  Creatinine elevated, unclear baseline.  Lactic acid was elevated.  No leukocytosis.  Continue vancomycin.  Blood culture sent.  Judicious fluids given large pleural effusion on the right.   Patient with likely diffuse primary metastatic disease.  Will discuss with oncology.  Patient is requiring supplemental oxygen, likely due to large pleural effusion.  Patient likely benefit from  thoracentesis versus catheter/chest tube placement for drainage.  Will discuss with pulmonary.  See ED course for further details.  Patient placed on 2 L nasal cannula, she is not in respiratory distress.  Dyspnea likely secondary to large right-sided pleural effusion.  Pulmonology to evaluate  Patient admitted to Dr. Loney Loh.  Oncology and pulmonology on consult. Oncology to see in the AM.           Social determinants of health include -  Social History   Socioeconomic History   Marital status: Single    Spouse name:  Not on file   Number of children: Not on file   Years of education: Not on file   Highest education level: Not on file  Occupational History   Not on file  Tobacco Use   Smoking status: Never   Smokeless tobacco: Never  Substance and Sexual Activity   Alcohol use: No   Drug use: No   Sexual activity: Not on file  Other Topics Concern   Not on file  Social History Narrative   Not on file   Social Determinants of Health   Financial Resource Strain: Not on file  Food Insecurity: Not on file  Transportation Needs: Not on file  Physical Activity: Not on file  Stress: Not on file  Social Connections: Not on file  Intimate Partner Violence: Not on file      This chart was dictated using voice recognition software.  Despite best efforts to proofread,  errors can occur which can change the documentation meaning.         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

## 2022-03-22 NOTE — Progress Notes (Signed)
Pharmacy Antibiotic Note  [name] is a 61 y.o. female admitted on 03/22/2022 with cellulitis.  Noted to have worsening purulent wound on R breast & chest wall.  Pharmacy has been consulted for Vancomycin dosing.  Weight documented as 62.2 kg for estimated CrCl ~ 39 ml/min  Plan: Vancomycin 750mg  IV q24h for estimated AUC 460 Goal AUC 400-550 Check Vancomycin levels at steady state Monitor renal function and cx data      Temp (24hrs), Avg:98.4 F (36.9 C), Min:98.4 F (36.9 C), Max:98.4 F (36.9 C)  Recent Labs  Lab 03/22/22 1740 03/22/22 1813  WBC 9.8  --   CREATININE 1.46*  --   LATICACIDVEN  --  3.1*    CrCl cannot be calculated (Unknown ideal weight.).    No Known Allergies  Antimicrobials this admission: 6/24 Vancomycin >>   Dose adjustments this admission:  Microbiology results: 6/24 BCx:   Thank you for allowing pharmacy to be a part of this patient's care.  Junita Push PharmD 03/22/2022 10:23 PM

## 2022-03-23 ENCOUNTER — Encounter (HOSPITAL_COMMUNITY): Payer: Self-pay | Admitting: Internal Medicine

## 2022-03-23 ENCOUNTER — Inpatient Hospital Stay (HOSPITAL_COMMUNITY): Payer: BC Managed Care – PPO

## 2022-03-23 ENCOUNTER — Other Ambulatory Visit: Payer: Self-pay

## 2022-03-23 DIAGNOSIS — Z20822 Contact with and (suspected) exposure to covid-19: Secondary | ICD-10-CM | POA: Diagnosis present

## 2022-03-23 DIAGNOSIS — E872 Acidosis, unspecified: Secondary | ICD-10-CM | POA: Diagnosis present

## 2022-03-23 DIAGNOSIS — D638 Anemia in other chronic diseases classified elsewhere: Secondary | ICD-10-CM | POA: Diagnosis present

## 2022-03-23 DIAGNOSIS — Z833 Family history of diabetes mellitus: Secondary | ICD-10-CM | POA: Diagnosis not present

## 2022-03-23 DIAGNOSIS — C50911 Malignant neoplasm of unspecified site of right female breast: Secondary | ICD-10-CM | POA: Diagnosis present

## 2022-03-23 DIAGNOSIS — C799 Secondary malignant neoplasm of unspecified site: Secondary | ICD-10-CM | POA: Diagnosis present

## 2022-03-23 DIAGNOSIS — I10 Essential (primary) hypertension: Secondary | ICD-10-CM | POA: Diagnosis present

## 2022-03-23 DIAGNOSIS — R5381 Other malaise: Secondary | ICD-10-CM | POA: Diagnosis present

## 2022-03-23 DIAGNOSIS — L03313 Cellulitis of chest wall: Secondary | ICD-10-CM | POA: Diagnosis present

## 2022-03-23 DIAGNOSIS — C792 Secondary malignant neoplasm of skin: Secondary | ICD-10-CM | POA: Diagnosis not present

## 2022-03-23 DIAGNOSIS — I890 Lymphedema, not elsewhere classified: Secondary | ICD-10-CM | POA: Diagnosis present

## 2022-03-23 DIAGNOSIS — Z8249 Family history of ischemic heart disease and other diseases of the circulatory system: Secondary | ICD-10-CM | POA: Diagnosis not present

## 2022-03-23 DIAGNOSIS — I959 Hypotension, unspecified: Secondary | ICD-10-CM | POA: Diagnosis present

## 2022-03-23 DIAGNOSIS — K5900 Constipation, unspecified: Secondary | ICD-10-CM | POA: Diagnosis present

## 2022-03-23 DIAGNOSIS — Z823 Family history of stroke: Secondary | ICD-10-CM | POA: Diagnosis not present

## 2022-03-23 DIAGNOSIS — E440 Moderate protein-calorie malnutrition: Secondary | ICD-10-CM | POA: Diagnosis present

## 2022-03-23 DIAGNOSIS — J910 Malignant pleural effusion: Secondary | ICD-10-CM | POA: Diagnosis present

## 2022-03-23 DIAGNOSIS — D75838 Other thrombocytosis: Secondary | ICD-10-CM | POA: Diagnosis present

## 2022-03-23 DIAGNOSIS — C787 Secondary malignant neoplasm of liver and intrahepatic bile duct: Secondary | ICD-10-CM | POA: Diagnosis present

## 2022-03-23 DIAGNOSIS — Z23 Encounter for immunization: Secondary | ICD-10-CM | POA: Diagnosis not present

## 2022-03-23 DIAGNOSIS — C7951 Secondary malignant neoplasm of bone: Secondary | ICD-10-CM | POA: Diagnosis present

## 2022-03-23 DIAGNOSIS — D509 Iron deficiency anemia, unspecified: Secondary | ICD-10-CM | POA: Diagnosis present

## 2022-03-23 DIAGNOSIS — R0602 Shortness of breath: Secondary | ICD-10-CM | POA: Diagnosis present

## 2022-03-23 DIAGNOSIS — Z6821 Body mass index (BMI) 21.0-21.9, adult: Secondary | ICD-10-CM | POA: Diagnosis not present

## 2022-03-23 DIAGNOSIS — G893 Neoplasm related pain (acute) (chronic): Secondary | ICD-10-CM | POA: Diagnosis present

## 2022-03-23 DIAGNOSIS — I96 Gangrene, not elsewhere classified: Secondary | ICD-10-CM | POA: Diagnosis present

## 2022-03-23 DIAGNOSIS — J90 Pleural effusion, not elsewhere classified: Secondary | ICD-10-CM | POA: Diagnosis not present

## 2022-03-23 DIAGNOSIS — N179 Acute kidney failure, unspecified: Secondary | ICD-10-CM | POA: Diagnosis present

## 2022-03-23 LAB — CBC
HCT: 23.2 % — ABNORMAL LOW (ref 36.0–46.0)
Hemoglobin: 7.2 g/dL — ABNORMAL LOW (ref 12.0–15.0)
MCH: 22.1 pg — ABNORMAL LOW (ref 26.0–34.0)
MCHC: 31 g/dL (ref 30.0–36.0)
MCV: 71.2 fL — ABNORMAL LOW (ref 80.0–100.0)
Platelets: 576 10*3/uL — ABNORMAL HIGH (ref 150–400)
RBC: 3.26 MIL/uL — ABNORMAL LOW (ref 3.87–5.11)
RDW: 15 % (ref 11.5–15.5)
WBC: 8.5 10*3/uL (ref 4.0–10.5)
nRBC: 0 % (ref 0.0–0.2)

## 2022-03-23 LAB — COMPREHENSIVE METABOLIC PANEL
ALT: 14 U/L (ref 0–44)
AST: 42 U/L — ABNORMAL HIGH (ref 15–41)
Albumin: 2.5 g/dL — ABNORMAL LOW (ref 3.5–5.0)
Alkaline Phosphatase: 72 U/L (ref 38–126)
Anion gap: 7 (ref 5–15)
BUN: 26 mg/dL — ABNORMAL HIGH (ref 8–23)
CO2: 23 mmol/L (ref 22–32)
Calcium: 8.4 mg/dL — ABNORMAL LOW (ref 8.9–10.3)
Chloride: 110 mmol/L (ref 98–111)
Creatinine, Ser: 1.17 mg/dL — ABNORMAL HIGH (ref 0.44–1.00)
GFR, Estimated: 53 mL/min — ABNORMAL LOW (ref >60)
Glucose, Bld: 113 mg/dL — ABNORMAL HIGH (ref 70–99)
Potassium: 4.1 mmol/L (ref 3.5–5.1)
Sodium: 140 mmol/L (ref 135–145)
Total Bilirubin: 0.6 mg/dL (ref 0.3–1.2)
Total Protein: 6.1 g/dL — ABNORMAL LOW (ref 6.5–8.1)

## 2022-03-23 LAB — HIV ANTIBODY (ROUTINE TESTING W REFLEX): HIV Screen 4th Generation wRfx: NONREACTIVE

## 2022-03-23 LAB — PROTEIN, PLEURAL OR PERITONEAL FLUID: Total protein, fluid: 3.1 g/dL

## 2022-03-23 LAB — BODY FLUID CELL COUNT WITH DIFFERENTIAL
Lymphs, Fluid: 70 %
Monocyte-Macrophage-Serous Fluid: 22 % — ABNORMAL LOW (ref 50–90)
Neutrophil Count, Fluid: 8 % (ref 0–25)
Total Nucleated Cell Count, Fluid: 207 cu mm (ref 0–1000)

## 2022-03-23 LAB — LACTATE DEHYDROGENASE, PLEURAL OR PERITONEAL FLUID: LD, Fluid: 259 U/L — ABNORMAL HIGH (ref 3–23)

## 2022-03-23 LAB — LACTIC ACID, PLASMA: Lactic Acid, Venous: 1.4 mmol/L (ref 0.5–1.9)

## 2022-03-23 MED ORDER — ENOXAPARIN SODIUM 40 MG/0.4ML IJ SOSY
40.0000 mg | PREFILLED_SYRINGE | Freq: Every day | INTRAMUSCULAR | Status: DC
  Administered 2022-03-23: 40 mg via SUBCUTANEOUS
  Filled 2022-03-23 (×3): qty 0.4

## 2022-03-23 MED ORDER — MEGESTROL ACETATE 40 MG PO TABS
40.0000 mg | ORAL_TABLET | Freq: Two times a day (BID) | ORAL | Status: DC
  Administered 2022-03-23 – 2022-03-27 (×9): 40 mg via ORAL
  Filled 2022-03-23 (×9): qty 1

## 2022-03-23 MED ORDER — VANCOMYCIN HCL 750 MG/150ML IV SOLN: 750.0000 mg | INTRAVENOUS | Status: DC

## 2022-03-23 MED ORDER — SODIUM CHLORIDE 0.9 % IV SOLN
400.0000 mg | Freq: Once | INTRAVENOUS | Status: AC
  Administered 2022-03-23: 400 mg via INTRAVENOUS
  Filled 2022-03-23: qty 20

## 2022-03-23 MED ORDER — OXYCODONE HCL 5 MG PO TABS: 5.0000 mg | ORAL_TABLET | Freq: Four times a day (QID) | ORAL | Status: DC | PRN

## 2022-03-23 MED ORDER — PNEUMOCOCCAL 20-VAL CONJ VACC 0.5 ML IM SUSY
0.5000 mL | PREFILLED_SYRINGE | INTRAMUSCULAR | Status: DC
  Filled 2022-03-23: qty 0.5

## 2022-03-23 MED ORDER — OXYCODONE HCL 5 MG PO TABS
5.0000 mg | ORAL_TABLET | ORAL | Status: DC | PRN
  Administered 2022-03-23: 5 mg via ORAL
  Filled 2022-03-23: qty 1

## 2022-03-23 MED ORDER — MELATONIN 3 MG PO TABS
3.0000 mg | ORAL_TABLET | Freq: Every evening | ORAL | Status: DC | PRN
  Administered 2022-03-23 – 2022-03-25 (×2): 3 mg via ORAL
  Filled 2022-03-23 (×2): qty 1

## 2022-03-23 MED ORDER — ADULT MULTIVITAMIN W/MINERALS CH
1.0000 | ORAL_TABLET | Freq: Every day | ORAL | Status: DC
  Administered 2022-03-23 – 2022-03-27 (×5): 1 via ORAL
  Filled 2022-03-23 (×5): qty 1

## 2022-03-23 MED ORDER — POLYETHYLENE GLYCOL 3350 17 G PO PACK
17.0000 g | PACK | Freq: Every day | ORAL | Status: DC
  Filled 2022-03-23 (×4): qty 1

## 2022-03-23 NOTE — Consult Note (Signed)
Chief Complaint: Patient was seen in consultation today for chest wall mass  Referring Physician(s): Dr. Bertis Ruddy  Supervising Physician: Irish Lack  Patient Status: [facility] - In-pt  History of Present Illness: [name] is a 61 y.o. female with past medical history of HTN who presents to the ED with chest wall pain, shortness of breath.  Physical exam findings consistent with neglected breast mass likely primary metastatic breast cancer. Patient undergo work-up during hospitalization for same.  Biopsy of chest wall mass as well as Port placement requested by Dr. Bertis Ruddy.  Case reviewed and approved by Dr. Fredia Sorrow if viable tissue present for [facility] bed.    Past Medical History:  Diagnosis Date   Hypertension     Past Surgical History:  Procedure Laterality Date   ANKLE SURGERY      Allergies: Patient has no known allergies.  Medications: Prior to Admission medications   Medication Sig Start Date End Date Taking? Authorizing Provider  Multiple Vitamins-Minerals (MULTIVITAMIN ADULT EXTRA C) CHEW Chew 3 tablets by mouth daily.   Yes [provider]  olmesartan-hydrochlorothiazide (BENICAR HCT) 40-12.5 MG tablet Take 1 tablet by mouth daily. Please schedule appointment for refills. Patient not taking: Reported on 03/22/2022 09/29/19   Chrystie Nose, MD     Family History  Problem Relation Age of Onset   Hypertension Mother    Heart disease Father    Stroke Father    Kidney disease Father    Hypertension Sister    Hypertension Maternal Grandmother    Stroke Maternal Grandfather    Diabetes Paternal Grandmother    Cancer Cousin     Social History   Socioeconomic History   Marital status: Single    Spouse name: Not on file   Number of children: 0   Years of education: Not on file   Highest education level: Not on file  Occupational History   Occupation: retired Runner, broadcasting/film/video  Tobacco Use   Smoking status: Never   Smokeless tobacco: Never   Substance and Sexual Activity   Alcohol use: No   Drug use: No   Sexual activity: Not on file  Other Topics Concern   Not on file  Social History Narrative   Not on file   Social Determinants of Health   Financial Resource Strain: Not on file  Food Insecurity: Not on file  Transportation Needs: Not on file  Physical Activity: Not on file  Stress: Not on file  Social Connections: Not on file     Review of Systems: A 12 point ROS discussed and pertinent positives are indicated in the HPI above.  All other systems are negative.  Review of Systems  Constitutional:  Negative for fatigue and fever.  Respiratory:  Positive for shortness of breath. Negative for cough.   Cardiovascular:  Positive for chest pain.  Gastrointestinal:  Negative for abdominal pain, diarrhea and nausea.  Genitourinary:  Negative for dysuria.  Musculoskeletal:  Negative for back pain.  Psychiatric/Behavioral:  Negative for behavioral problems and confusion.     Vital Signs: BP (!) 92/55   Pulse 93   Temp (!) 97.5 F (36.4 C) (Oral)   Resp (!) 22   Ht 5' 6.5" (1.689 m)   Wt 137 lb 2 oz (62.2 kg)   SpO2 100%   BMI 21.80 kg/m   Physical Exam Vitals and nursing note reviewed.  Constitutional:      General: She is not in acute distress.    Appearance: She is well-developed. She  is not ill-appearing.  Cardiovascular:     Rate and Rhythm: Normal rate and regular rhythm.  Pulmonary:     Effort: Pulmonary effort is normal.  Musculoskeletal:     Cervical back: Normal range of motion and neck supple.  Skin:    General: Skin is warm and dry.  Neurological:     General: No focal deficit present.     Mental Status: She is alert and oriented to person, place, and time.  Psychiatric:        Mood and Affect: Mood normal.        Behavior: Behavior normal.      MD Evaluation Airway: WNL Heart: WNL Abdomen: WNL Chest/ Lungs: WNL ASA  Classification: 3 Mallampati/Airway Score:  Two   Imaging: DG Chest 1 View  Result Date: 03/23/2022 CLINICAL DATA:  Status post thoracentesis. EXAM: CHEST  1 VIEW COMPARISON:  CT 03/22/2022 FINDINGS: Stable cardiomediastinal contours. Interval decreased volume of the right pleural effusion status post thoracentesis. No pneumothorax identified. Persistence of atelectasis within the right lower lung. Left lung clear. IMPRESSION: Decrease in right pleural effusion status post thoracentesis. No pneumothorax. Electronically Signed   By: Signa Kell M.D.   On: 03/23/2022 12:30   CT ABDOMEN PELVIS W CONTRAST  Result Date: 03/22/2022 CLINICAL DATA:  Chest mass, metastatic evaluation * Tracking Code: BO * EXAM: CT ABDOMEN AND PELVIS WITH CONTRAST TECHNIQUE: Multidetector CT imaging of the abdomen and pelvis was performed using the standard protocol following bolus administration of intravenous contrast. RADIATION DOSE REDUCTION: This exam was performed according to the departmental dose-optimization program which includes automated exposure control, adjustment of the mA and/or kV according to patient size and/or use of iterative reconstruction technique. CONTRAST:  OMNIPAQUE IOHEXOL 350 MG/ML SOLN COMPARISON:  None Available. FINDINGS: Lower chest: Please see separately reported examination of the chest. Hepatobiliary: Multiple low-attenuation lesions scattered throughout the liver, the largest of which appear to be fluid attenuation simple cysts or hemangiomata, others too small to characterize. No gallstones, gallbladder wall thickening, or biliary dilatation. Pancreas: Unremarkable. No pancreatic ductal dilatation or surrounding inflammatory changes. Spleen: Normal in size without significant abnormality. Adrenals/Urinary Tract: Adrenal glands are unremarkable. Kidneys are normal, without renal calculi, solid lesion, or hydronephrosis. Bladder is unremarkable. Stomach/Bowel: Stomach is within normal limits. Appendix appears normal. No evidence of  bowel wall thickening, distention, or inflammatory changes. Vascular/Lymphatic: Superficial venous collateralization about the ventral abdomen, likely secondary to central venous obstruction in the chest. No enlarged abdominal or pelvic lymph nodes. Reproductive: No mass or other significant abnormality. Other: No abdominal wall hernia or abnormality.  Anasarca. Musculoskeletal: Lytic lesion of left aspect of the L4 vertebral body and left pedicle. IMPRESSION: 1. Multiple low-attenuation lesions scattered throughout the liver, the largest of which appear to be fluid attenuation simple cysts or hemangiomata, others too small to characterize. Hepatic metastases not excluded. 2. Lytic lesion of left aspect of the L4 vertebral body and left pedicle, consistent with osseous metastatic disease. 3. Superficial venous collateralization about the ventral abdomen, likely secondary to central venous obstruction in the chest. 4. Anasarca. Electronically Signed   By: Jearld Lesch M.D.   On: 03/22/2022 20:04   CT Angio Chest PE W and/or Wo Contrast  Result Date: 03/22/2022 CLINICAL DATA:  PE suspected, metastatic disease, likely breast * Tracking Code: BO * EXAM: CT ANGIOGRAPHY CHEST WITH CONTRAST TECHNIQUE: Multidetector CT imaging of the chest was performed using the standard protocol during bolus administration of intravenous contrast. Multiplanar CT image  reconstructions and MIPs were obtained to evaluate the vascular anatomy. RADIATION DOSE REDUCTION: This exam was performed according to the departmental dose-optimization program which includes automated exposure control, adjustment of the mA and/or kV according to patient size and/or use of iterative reconstruction technique. CONTRAST:  OMNIPAQUE IOHEXOL 350 MG/ML SOLN COMPARISON:  None Available. FINDINGS: Cardiovascular: Examination for pulmonary embolism by breath motion artifact throughout and atelectasis of the right lung; within this limitation, no evidence  pulmonary embolism through the proximal segmental pulmonary arterial level. Normal heart size. No pericardial effusion. Mediastinum/Nodes: No enlarged mediastinal, hilar, or axillary lymph nodes. Thyroid gland, trachea, and esophagus demonstrate no significant findings. Lungs/Pleura: Large right pleural effusion associated atelectasis or consolidation. Upper Abdomen: Please see separately reported examination of the abdomen and pelvis. Musculoskeletal: Bulky, confluent, ulcerated mass of the right breast, with very extensive skin thickening nodularity extending across essentially the entire right chest wall and into the underlying musculature, as well as contralaterally to involve the left breast with severe skin thickening (series 4, image 69). Lytic osseous lesions, for example of the right fifth rib (series 4, image 51) and sternal body (series 7, image 83). Lytic pathologic fracture deformity of the T3 vertebral body (series 7, image 83). Review of the MIP images confirms the above findings. IMPRESSION: 1. Examination for pulmonary embolism limited by breath motion artifact throughout and atelectasis of the right lung; within this limitation, no evidence of pulmonary embolism through the proximal segmental pulmonary arterial level. 2. Bulky, confluent, ulcerated mass of the right breast, with very extensive skin thickening nodularity extending across essentially the entire right chest wall and into the underlying musculature, as well as contralaterally to involve the left breast with severe skin thickening. 3. Lytic osseous lesions, for example of the right fifth rib and sternal body. Lytic pathologic fracture deformity of the T3 vertebral body. 4. Large right pleural effusion and associated atelectasis or consolidation, presumed malignant however without directly visualized nodularity or pleural thickening. 5. Findings are consistent with advanced primary breast malignancy and associated metastatic disease.  Electronically Signed   By: Jearld Lesch M.D.   On: 03/22/2022 19:58   CT Head Wo Contrast  Result Date: 03/22/2022 CLINICAL DATA:  Evaluate for brain metastases. EXAM: CT HEAD WITHOUT CONTRAST TECHNIQUE: Contiguous axial images were obtained from the base of the skull through the vertex without intravenous contrast. RADIATION DOSE REDUCTION: This exam was performed according to the departmental dose-optimization program which includes automated exposure control, adjustment of the mA and/or kV according to patient size and/or use of iterative reconstruction technique. COMPARISON:  None Available. FINDINGS: Brain: No evidence of acute infarction, hemorrhage, hydrocephalus, extra-axial collection or mass lesion/mass effect. Vascular: No hyperdense vessel or unexpected calcification. Skull: Normal. Negative for fracture or focal lesion. Sinuses/Orbits: No acute finding. Other: None. IMPRESSION: 1. No acute intracranial abnormalities.  Normal brain. Electronically Signed   By: Signa Kell M.D.   On: 03/22/2022 19:39    Labs:  CBC: Recent Labs    03/22/22 1740 03/23/22 0012  WBC 9.8 8.5  HGB 7.5* 7.2*  HCT 24.3* 23.2*  PLT 629* 576*    COAGS: No results for input(s): "INR", "APTT" in the last 8760 hours.  BMP: Recent Labs    03/22/22 1740 03/23/22 0012  NA 141 140  K 3.7 4.1  CL 108 110  CO2 23 23  GLUCOSE 133* 113*  BUN 29* 26*  CALCIUM 8.8* 8.4*  CREATININE 1.46* 1.17*  GFRNONAA 41* 53*    LIVER  FUNCTION TESTS: Recent Labs    03/23/22 0012  BILITOT 0.6  AST 42*  ALT 14  ALKPHOS 72  PROT 6.1*  ALBUMIN 2.5*    TUMOR MARKERS: No results for input(s): "AFPTM", "CEA", "CA199", "CHROMGRNA" in the last 8760 hours.  Assessment and Plan: Likely metastatic breast cancer Patient with extensive right chest wall deterioration related to expansive chest mass.  Severe right arm swelling. IR consulted for biopsy which has been approved by Dr. Fredia Sorrow.  Also with left breast  involvement which appears to be currently confined to the breast tissue and spare the upper left chest.  Port placement is considered, although during discussion with patient today she states she would be open to (and actually prefers) a PICC.  Patient is consented for Port vs. PICC placement to be determined at the time of the procedure by performing provider.   Patient with improved breathing after 1.8L thoracentesis by CCM this afternoon.  Able to lie flat during visit.   Risks and benefits of biopsy was discussed with the patient and/or patient's family including, but not limited to bleeding, infection, damage to adjacent structures or low yield requiring additional tests.  Risks and benefits of image guided port-a-catheter placement was discussed with the patient including, but not limited to bleeding, infection, pneumothorax, or fibrin sheath development and need for additional procedures.  Consent signed and in chart.   Thank you for this interesting consult.  I greatly enjoyed meeting Shabrina Rentas and look forward to participating in their care.  A copy of this report was sent to the requesting provider on this date.  Electronically Signed: Hoyt Koch, PA 03/23/2022, 2:51 PM   I spent a total of 40 Minutes    in face to face in clinical consultation, greater than 50% of which was counseling/coordinating care for fungating breast mass.

## 2022-03-23 NOTE — Progress Notes (Signed)
Pre-thoracentesis vital signs, patient stable, alert and oriented x 4. Time out completed

## 2022-03-23 NOTE — Progress Notes (Signed)
Post post procedure vitals, vital signs stable, patient alert and oriented x4

## 2022-03-23 NOTE — Progress Notes (Signed)
PROGRESS NOTE    [name]  ZOX:[id number] DOB: 1961/04/12 DOA: 03/22/2022 PCP: Pcp, No    Brief Narrative:  61 year old female with history of hypertension currently untreated presented to the emergency room with shortness of breath that is gradually worsening for many months, nonhealing wound on her right breast, right hand swelling.  Patient is apprehensive about healthcare so she did not seek any medical treatment.  Her pain was not managed at home with Tylenol so she ended up coming to the hospital.  In the emergency room found to have chronic anemia, electrolytes are fairly normal.  Patient was found with ulcerated and fungating right breast wound, right hand lymphedema, malignant pleural effusion and metastatic tumor.  Lactic osseous lesions in the rib and sternal body.  Now she seeks treatment so admitted to the hospital.  Apparently abnormality on her right breast present since last 4 to 5 years.   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  Has extensive necrotic wound with drainage.  Will need extensive wound care ongoing.  Continue vancomycin until clinical improvement. -Oncology following, decided to start patient on chemotherapy along with antibiotics.  IR consulted for port placement, right pleural effusion diagnostic and therapeutic thoracentesis either by IR or pulmonary, superficial tissues and skin nodule biopsies. -Pain not managed with Tylenol, will start patient on oxycodone for cancer related pain, mobilize with PT OT. -Nutrition to see to help with augmenting nutrition. -1 dose of IV iron today. -Started on Megace for appetite stimulation and pain control due to breast cancer.  Case discussed with  oncology.   DVT prophylaxis: SCDs Start: 03/22/22 2218   Code Status: Full code Family Communication: None Disposition Plan: Status is: Inpatient Remains inpatient appropriate because: New diagnosis of breast cancer, wound care, inpatient procedures     Consultants:  Oncology Pulmonary IR  Procedures:  None  Antimicrobials:  Vancomycin 6/24---   Subjective: Patient seen and examined.  She had just finished talking with oncology physician.  Patient tells me that she understood all discussions.  Patient is aware that this is probably metastatic cancer with extensive skin wound and necrosis.  This is widespread cancer.  Oncology recommended to start on chemotherapy and she is thinking about it and probably go for it. Patient is apprehensive about port placement. We discussed that it should be approached from multiple avenues, once she starts chemotherapy, she needs to have very strict medical follow-up and she needs to be ready for that.  Patient decided that she is willing to continue ongoing follow-up.  Objective: Vitals:   03/23/22 0005 03/23/22 0015 03/23/22 0624 03/23/22 0853  BP: 96/70  101/60 104/61  Pulse: 93  93 89  Resp: 20  20 20   Temp: (!) 97.4 F (36.3 C)  97.7 F (36.5 C) 97.6 F (36.4 C)  TempSrc: Oral  Oral Oral  SpO2: 100%  97% 99%  Weight:  62.2 kg    Height:  5' 6.5" (1.689 m)      Intake/Output Summary (Last 24 hours) at 03/23/2022 1013 Last data filed at 03/23/2022 0400 Gross per 24 hour  Intake 471.04 ml  Output --  Net 471.04 ml   Filed Weights   03/23/22 0015  Weight: 62.2 kg    Examination:  General exam: Appears  calm and comfortable  Currently on room air.  Not in any distress. Respiratory system: No air entry on the right side. Cardiovascular system: S1 & S2 heard, RRR.  Gastrointestinal system: Soft.  Nontender.   Central nervous system: Alert and oriented. No focal neurological deficits. Extremities: Symmetric 5 x 5  power. Skin: Fungating and necrotic draining ulcer, total destroyed right breast, indurated skin, hard indurated left breast.     Data Reviewed: I have personally reviewed following labs and imaging studies  CBC: Recent Labs  Lab 03/22/22 1740 03/23/22 0012  WBC 9.8 8.5  NEUTROABS 8.6*  --   HGB 7.5* 7.2*  HCT 24.3* 23.2*  MCV 70.6* 71.2*  PLT 629* 576*   Basic Metabolic Panel: Recent Labs  Lab 03/22/22 1740 03/23/22 0012  NA 141 140  K 3.7 4.1  CL 108 110  CO2 23 23  GLUCOSE 133* 113*  BUN 29* 26*  CREATININE 1.46* 1.17*  CALCIUM 8.8* 8.4*   GFR: Estimated Creatinine Clearance: 48.2 mL/min (A) (by C-G formula based on SCr of 1.17 mg/dL (H)). Liver Function Tests: Recent Labs  Lab 03/23/22 0012  AST 42*  ALT 14  ALKPHOS 72  BILITOT 0.6  PROT 6.1*  ALBUMIN 2.5*   No results for input(s): "LIPASE", "AMYLASE" in the last 168 hours. No results for input(s): "AMMONIA" in the last 168 hours. Coagulation Profile: No results for input(s): "INR", "PROTIME" in the last 168 hours. Cardiac Enzymes: No results for input(s): "CKTOTAL", "CKMB", "CKMBINDEX", "TROPONINI" in the last 168 hours. BNP (last 3 results) No results for input(s): "PROBNP" in the last 8760 hours. HbA1C: No results for input(s): "HGBA1C" in the last 72 hours. CBG: No results for input(s): "GLUCAP" in the last 168 hours. Lipid Profile: No results for input(s): "CHOL", "HDL", "LDLCALC", "TRIG", "CHOLHDL", "LDLDIRECT" in the last 72 hours. Thyroid Function Tests: No results for input(s): "TSH", "T4TOTAL", "FREET4", "T3FREE", "THYROIDAB" in the last 72 hours. Anemia Panel: Recent Labs    03/22/22 1950  VITAMINB12 553  FOLATE 7.7  FERRITIN 65  TIBC 220*  IRON 14*  RETICCTPCT 1.6   Sepsis Labs: Recent Labs  Lab 03/22/22 1813 03/23/22 0011  LATICACIDVEN 3.1* 1.4    Recent Results (from the past 240 hour(s))  Blood culture (routine x 2)     Status: None (Preliminary result)    Collection Time: 03/22/22  5:40 PM   Specimen: BLOOD  Result Value Ref Range Status   Specimen Description   Final    BLOOD LEFT ANTECUBITAL Performed at [facility], [address], [place], [place] [address]    Special Requests   Final    BOTTLES DRAWN AEROBIC AND ANAEROBIC Blood Culture adequate volume Performed at [facility], [address], [place], [place] [address]    Culture   Final    NO GROWTH < 12 HOURS Performed at [facility], [address], [place], [place] [address]    Report Status PENDING  Incomplete  Resp Panel by RT-PCR (Flu A&B, Covid)     Status: None   Collection Time: 03/22/22  5:48 PM   Specimen: Nasal Swab  Result Value Ref Range Status   SARS Coronavirus 2 by RT PCR NEGATIVE NEGATIVE Final    Comment: (NOTE) SARS-CoV-2 target nucleic acids are NOT DETECTED.  The SARS-CoV-2 RNA is generally detectable in upper respiratory specimens during the acute phase of infection. The lowest concentration of SARS-CoV-2 viral copies this assay can detect is 138  copies/mL. A negative result does not preclude SARS-Cov-2 infection and should not be used as the sole basis for treatment or other patient management decisions. A negative result may occur with  improper specimen collection/handling, submission of specimen other than nasopharyngeal swab, presence of viral mutation(s) within the areas targeted by this assay, and inadequate number of viral copies(<138 copies/mL). A negative result must be combined with clinical observations, patient history, and epidemiological information. The expected result is Negative.  Fact Sheet for Patients:  BloggerCourse.com  Fact Sheet for Healthcare Providers:  SeriousBroker.it  This test is no t yet approved or cleared by the [place] FDA and  has been authorized for detection and/or diagnosis of SARS-CoV-2 by FDA under an  Emergency Use Authorization (EUA). This EUA will remain  in effect (meaning this test can be used) for the duration of the COVID-19 declaration under Section 564(b)(1) of the Act, 21 U.S.C.section 360bbb-3(b)(1), unless the authorization is terminated  or revoked sooner.       Influenza A by PCR NEGATIVE NEGATIVE Final   Influenza B by PCR NEGATIVE NEGATIVE Final    Comment: (NOTE) The Xpert Xpress SARS-CoV-2/FLU/RSV plus assay is intended as an aid in the diagnosis of influenza from Nasopharyngeal swab specimens and should not be used as a sole basis for treatment. Nasal washings and aspirates are unacceptable for Xpert Xpress SARS-CoV-2/FLU/RSV testing.  Fact Sheet for Patients: BloggerCourse.com  Fact Sheet for Healthcare Providers: SeriousBroker.it  This test is not yet approved or cleared by the [place] FDA and has been authorized for detection and/or diagnosis of SARS-CoV-2 by FDA under an Emergency Use Authorization (EUA). This EUA will remain in effect (meaning this test can be used) for the duration of the COVID-19 declaration under Section 564(b)(1) of the Act, 21 U.S.C. section 360bbb-3(b)(1), unless the authorization is terminated or revoked.  Performed at [facility], [address], [place], [place] [address]   Blood culture (routine x 2)     Status: None (Preliminary result)   Collection Time: 03/22/22  6:00 PM   Specimen: BLOOD  Result Value Ref Range Status   Specimen Description   Final    BLOOD RIGHT ANTECUBITAL Performed at [facility] [address], [place], [place] [address]    Special Requests   Final    BOTTLES DRAWN AEROBIC AND ANAEROBIC Blood Culture adequate volume Performed at [facility], [address], [place], [place] [address]    Culture   Final    NO GROWTH < 12 HOURS Performed at [facility], [address] [place], [place] [address]    Report Status PENDING  Incomplete         Radiology Studies: CT ABDOMEN PELVIS W CONTRAST  Result Date: 03/22/2022 CLINICAL DATA:  Chest mass, metastatic evaluation * Tracking Code: BO * EXAM: CT ABDOMEN AND PELVIS WITH CONTRAST TECHNIQUE: Multidetector CT imaging of the abdomen and pelvis was performed using the standard protocol following bolus administration of intravenous contrast. RADIATION DOSE REDUCTION: This exam was performed according to the departmental dose-optimization program which includes automated exposure control, adjustment of the mA and/or kV according to patient size and/or use of iterative reconstruction technique. CONTRAST:  OMNIPAQUE IOHEXOL 350 MG/ML SOLN COMPARISON:  None Available. FINDINGS: Lower chest: Please see separately reported examination of the chest. Hepatobiliary: Multiple low-attenuation lesions scattered throughout the liver, the largest of which appear to be fluid attenuation simple cysts or hemangiomata, others too small  to characterize. No gallstones, gallbladder wall thickening, or biliary dilatation. Pancreas: Unremarkable. No pancreatic ductal dilatation or surrounding inflammatory changes. Spleen: Normal in size without significant abnormality. Adrenals/Urinary Tract: Adrenal glands are unremarkable. Kidneys are normal, without renal calculi, solid lesion, or hydronephrosis. Bladder is unremarkable. Stomach/Bowel: Stomach is within normal limits. Appendix appears normal. No evidence of bowel wall thickening, distention, or inflammatory changes. Vascular/Lymphatic: Superficial venous collateralization about the ventral abdomen, likely secondary to central venous obstruction in the chest. No enlarged abdominal or pelvic lymph nodes. Reproductive: No mass or other significant abnormality. Other: No abdominal wall hernia or abnormality.  Anasarca. Musculoskeletal: Lytic lesion of left aspect of the L4 vertebral body and left  pedicle. IMPRESSION: 1. Multiple low-attenuation lesions scattered throughout the liver, the largest of which appear to be fluid attenuation simple cysts or hemangiomata, others too small to characterize. Hepatic metastases not excluded. 2. Lytic lesion of left aspect of the L4 vertebral body and left pedicle, consistent with osseous metastatic disease. 3. Superficial venous collateralization about the ventral abdomen, likely secondary to central venous obstruction in the chest. 4. Anasarca. Electronically Signed   By: Jearld Lesch M.D.   On: 03/22/2022 20:04   CT Angio Chest PE W and/or Wo Contrast  Result Date: 03/22/2022 CLINICAL DATA:  PE suspected, metastatic disease, likely breast * Tracking Code: BO * EXAM: CT ANGIOGRAPHY CHEST WITH CONTRAST TECHNIQUE: Multidetector CT imaging of the chest was performed using the standard protocol during bolus administration of intravenous contrast. Multiplanar CT image reconstructions and MIPs were obtained to evaluate the vascular anatomy. RADIATION DOSE REDUCTION: This exam was performed according to the departmental dose-optimization program which includes automated exposure control, adjustment of the mA and/or kV according to patient size and/or use of iterative reconstruction technique. CONTRAST:  OMNIPAQUE IOHEXOL 350 MG/ML SOLN COMPARISON:  None Available. FINDINGS: Cardiovascular: Examination for pulmonary embolism by breath motion artifact throughout and atelectasis of the right lung; within this limitation, no evidence pulmonary embolism through the proximal segmental pulmonary arterial level. Normal heart size. No pericardial effusion. Mediastinum/Nodes: No enlarged mediastinal, hilar, or axillary lymph nodes. Thyroid gland, trachea, and esophagus demonstrate no significant findings. Lungs/Pleura: Large right pleural effusion associated atelectasis or consolidation. Upper Abdomen: Please see separately reported examination of the abdomen and pelvis.  Musculoskeletal: Bulky, confluent, ulcerated mass of the right breast, with very extensive skin thickening nodularity extending across essentially the entire right chest wall and into the underlying musculature, as well as contralaterally to involve the left breast with severe skin thickening (series 4, image 69). Lytic osseous lesions, for example of the right fifth rib (series 4, image 51) and sternal body (series 7, image 83). Lytic pathologic fracture deformity of the T3 vertebral body (series 7, image 83). Review of the MIP images confirms the above findings. IMPRESSION: 1. Examination for pulmonary embolism limited by breath motion artifact throughout and atelectasis of the right lung; within this limitation, no evidence of pulmonary embolism through the proximal segmental pulmonary arterial level. 2. Bulky, confluent, ulcerated mass of the right breast, with very extensive skin thickening nodularity extending across essentially the entire right chest wall and into the underlying musculature, as well as contralaterally to involve the left breast with severe skin thickening. 3. Lytic osseous lesions, for example of the right fifth rib and sternal body. Lytic pathologic fracture deformity of the T3 vertebral body. 4. Large right pleural effusion and associated atelectasis or consolidation, presumed malignant however without directly visualized nodularity or pleural thickening. 5. Findings are  consistent with advanced primary breast malignancy and associated metastatic disease. Electronically Signed   By: Jearld Lesch M.D.   On: 03/22/2022 19:58   CT Head Wo Contrast  Result Date: 03/22/2022 CLINICAL DATA:  Evaluate for brain metastases. EXAM: CT HEAD WITHOUT CONTRAST TECHNIQUE: Contiguous axial images were obtained from the base of the skull through the vertex without intravenous contrast. RADIATION DOSE REDUCTION: This exam was performed according to the departmental dose-optimization program which  includes automated exposure control, adjustment of the mA and/or kV according to patient size and/or use of iterative reconstruction technique. COMPARISON:  None Available. FINDINGS: Brain: No evidence of acute infarction, hemorrhage, hydrocephalus, extra-axial collection or mass lesion/mass effect. Vascular: No hyperdense vessel or unexpected calcification. Skull: Normal. Negative for fracture or focal lesion. Sinuses/Orbits: No acute finding. Other: None. IMPRESSION: 1. No acute intracranial abnormalities.  Normal brain. Electronically Signed   By: Signa Kell M.D.   On: 03/22/2022 19:39        Scheduled Meds:  megestrol  40 mg Oral BID   multivitamin with minerals  1 tablet Oral Daily   [START ON 03/24/2022] pneumococcal 20-valent conjugate vaccine  0.5 mL Intramuscular Tomorrow-1000   polyethylene glycol  17 g Oral Daily   Continuous Infusions:  sodium chloride 125 mL/hr at 03/23/22 0013   iron sucrose     [START ON 03/24/2022] vancomycin       LOS: 0 days    Time spent: 35 minutes    Dorcas Carrow, MD [facility] Pager [phone]

## 2022-03-24 ENCOUNTER — Telehealth: Payer: Self-pay | Admitting: Internal Medicine

## 2022-03-24 ENCOUNTER — Inpatient Hospital Stay (HOSPITAL_COMMUNITY): Payer: BC Managed Care – PPO

## 2022-03-24 DIAGNOSIS — C50911 Malignant neoplasm of unspecified site of right female breast: Secondary | ICD-10-CM | POA: Diagnosis not present

## 2022-03-24 DIAGNOSIS — D509 Iron deficiency anemia, unspecified: Secondary | ICD-10-CM

## 2022-03-24 DIAGNOSIS — I10 Essential (primary) hypertension: Secondary | ICD-10-CM | POA: Diagnosis not present

## 2022-03-24 DIAGNOSIS — C792 Secondary malignant neoplasm of skin: Secondary | ICD-10-CM | POA: Diagnosis not present

## 2022-03-24 DIAGNOSIS — J90 Pleural effusion, not elsewhere classified: Secondary | ICD-10-CM | POA: Diagnosis not present

## 2022-03-24 HISTORY — PX: IR US GUIDE BX ASP/DRAIN: IMG2392

## 2022-03-24 LAB — CBC
HCT: 21.8 % — ABNORMAL LOW (ref 36.0–46.0)
Hemoglobin: 6.7 g/dL — CL (ref 12.0–15.0)
MCH: 21.7 pg — ABNORMAL LOW (ref 26.0–34.0)
MCHC: 30.7 g/dL (ref 30.0–36.0)
MCV: 70.6 fL — ABNORMAL LOW (ref 80.0–100.0)
Platelets: 533 10*3/uL — ABNORMAL HIGH (ref 150–400)
RBC: 3.09 MIL/uL — ABNORMAL LOW (ref 3.87–5.11)
RDW: 15.1 % (ref 11.5–15.5)
WBC: 8.3 10*3/uL (ref 4.0–10.5)
nRBC: 0 % (ref 0.0–0.2)

## 2022-03-24 LAB — ABO/RH: ABO/RH(D): AB POS

## 2022-03-24 LAB — CA 125: Cancer Antigen (CA) 125: 65.7 U/mL — ABNORMAL HIGH (ref 0.0–38.1)

## 2022-03-24 LAB — CEA: CEA: 69.2 ng/mL — ABNORMAL HIGH (ref 0.0–4.7)

## 2022-03-24 LAB — CREATININE, SERUM
Creatinine, Ser: 1.11 mg/dL — ABNORMAL HIGH (ref 0.44–1.00)
GFR, Estimated: 57 mL/min — ABNORMAL LOW (ref >60)

## 2022-03-24 LAB — PREPARE RBC (CROSSMATCH)

## 2022-03-24 LAB — LACTIC ACID, PLASMA
Lactic Acid, Venous: 0.9 mmol/L (ref 0.5–1.9)
Lactic Acid, Venous: 1.4 mmol/L (ref 0.5–1.9)

## 2022-03-24 MED ORDER — HYDROMORPHONE HCL 2 MG PO TABS
1.0000 mg | ORAL_TABLET | Freq: Once | ORAL | Status: AC
  Administered 2022-03-24: 1 mg via ORAL
  Filled 2022-03-24: qty 1

## 2022-03-24 MED ORDER — SODIUM CHLORIDE 0.9 % IV BOLUS
500.0000 mL | Freq: Once | INTRAVENOUS | Status: AC
  Administered 2022-03-24: 500 mL via INTRAVENOUS

## 2022-03-24 MED ORDER — SODIUM CHLORIDE 0.9% IV SOLUTION: Freq: Once | INTRAVENOUS | Status: AC

## 2022-03-24 MED ORDER — LIDOCAINE HCL 1 % IJ SOLN
INTRAMUSCULAR | Status: AC
  Filled 2022-03-24: qty 20

## 2022-03-24 MED ORDER — VANCOMYCIN HCL IN DEXTROSE 1-5 GM/200ML-% IV SOLN
1000.0000 mg | INTRAVENOUS | Status: DC
  Administered 2022-03-24 – 2022-03-27 (×4): 1000 mg via INTRAVENOUS
  Filled 2022-03-24 (×4): qty 200

## 2022-03-24 MED ORDER — ACETAMINOPHEN 325 MG PO TABS
650.0000 mg | ORAL_TABLET | Freq: Once | ORAL | Status: AC
  Administered 2022-03-24: 650 mg via ORAL
  Filled 2022-03-24: qty 2

## 2022-03-24 MED ORDER — CHLORHEXIDINE GLUCONATE CLOTH 2 % EX PADS
6.0000 | MEDICATED_PAD | Freq: Every day | CUTANEOUS | Status: DC
  Administered 2022-03-25 – 2022-03-27 (×3): 6 via TOPICAL

## 2022-03-24 MED ORDER — METRONIDAZOLE 0.75 % EX GEL
Freq: Every day | CUTANEOUS | Status: DC
  Filled 2022-03-24 (×2): qty 45

## 2022-03-24 MED ORDER — ENSURE ENLIVE PO LIQD
237.0000 mL | Freq: Three times a day (TID) | ORAL | Status: DC
  Administered 2022-03-25 – 2022-03-27 (×5): 237 mL via ORAL

## 2022-03-24 NOTE — Plan of Care (Signed)
Pt reports pain 5/10.  Receiving 1 unit RBCs today.  Problem: Education: Goal: Knowledge of General Education information will improve Description: Including pain rating scale, medication(s)/side effects and non-pharmacologic comfort measures Outcome: Progressing   Problem: Health Behavior/Discharge Planning: Goal: Ability to manage health-related needs will improve Outcome: Progressing   Problem: Clinical Measurements: Goal: Ability to maintain clinical measurements within normal limits will improve Outcome: Progressing Goal: Will remain free from infection Outcome: Progressing Goal: Diagnostic test results will improve Outcome: Progressing Goal: Respiratory complications will improve Outcome: Progressing Goal: Cardiovascular complication will be avoided Outcome: Progressing   Problem: Activity: Goal: Risk for activity intolerance will decrease Outcome: Progressing   Problem: Nutrition: Goal: Adequate nutrition will be maintained Outcome: Progressing   Problem: Coping: Goal: Level of anxiety will decrease Outcome: Progressing   Problem: Elimination: Goal: Will not experience complications related to bowel motility Outcome: Progressing Goal: Will not experience complications related to urinary retention Outcome: Progressing   Problem: Pain Managment: Goal: General experience of comfort will improve Outcome: Progressing   Problem: Safety: Goal: Ability to remain free from injury will improve Outcome: Progressing   Problem: Skin Integrity: Goal: Risk for impaired skin integrity will decrease Outcome: Progressing   Problem: Clinical Measurements: Goal: Ability to avoid or minimize complications of infection will improve Outcome: Progressing   Problem: Skin Integrity: Goal: Skin integrity will improve Outcome: Progressing

## 2022-03-24 NOTE — Care Plan (Signed)
Patient's hemoglobin came back at 6.7. Physician notified and asked to get consent from the patient for blood transfusion. Patient stated, "I have to talk to my family". She stated to wait until they come in this morning. Physician notified.

## 2022-03-24 NOTE — Progress Notes (Signed)
  Transition of Care [facility]) Screening Note   Patient Details  Name: [name] Date of Birth: 1960-12-03   Transition of Care [facility]) CM/SW Contact:    Veroncia Jezek, Meriam Sprague, RN Phone Number: 03/24/2022, 1:41 PM    Transition of Care Department [facility]) has reviewed patient and no TOC needs have been identified at this time. We will continue to monitor patient advancement through interdisciplinary progression rounds. If new patient transition needs arise, please place a TOC consult.

## 2022-03-24 NOTE — Consult Note (Signed)
NAME:  [name], MRN:  [id number], DOB:  1961-07-30, LOS: 1 ADMISSION DATE:  03/22/2022, CONSULTATION DATE: 03/23/2022 REFERRING MD: Dr. Jerral Ralph, CHIEF COMPLAINT: Shortness of breath  BRIEF  Asked to see patient for large pleural effusion 03/22/22 Has not ulcerative breast mass, fungating wound right hand lymphedema Did not seek medical care  Lesion in the breast has been present for 4 to 5 years  She is usually apprehensive of medical care and that is why she did not seek any intervention  Admits to shortness of breath with most activities  Pertinent  Medical History   Past Medical History:  Diagnosis Date   Hypertension     Significant Hospital Events: Including procedures, antibiotic start and stop dates in addition to other pertinent events   CT with large right pleural effusion-reviewed by myself 6/24- ccm consult 6/25 Onc consult for R breast wound, RUE lymphemsema Rt effusion -concern malignancy Iron def anemia Rt UE lymphedema Bone mets with cancer pain NEglected breast cancer 6/25 - Rt large volume thora by Dr Rise Paganini [facility] [address] - pending  Interim History / Subjective:   6/26 - feels less dyspneic after thora. Ambulated today. Reports RUE lymphatic edema x few weeks. HAs PICC/Portocath pending today. AWare she has breast cancer. She is going to get chemo she says. She does NOT want Pleurx or VATS at this point  Objective   Blood pressure (!) 99/58, pulse (!) 105, temperature 97.9 F (36.6 C), temperature source Oral, resp. rate 18, height 5' 6.5" (1.689 m), weight 62.2 kg, SpO2 98 %.        Intake/Output Summary (Last 24 hours) at 03/24/2022 1225 Last data filed at 03/23/2022 1900 Gross per 24 hour  Intake 240 ml  Output --  Net 240 ml   Filed Weights   03/23/22 0015  Weight: 62.2 kg    Examination: General Appearance:  Looks well. [name] at bedside Head:  Normocephalic, without obvious abnormality, atraumatic Eyes:  PERRL - yes,  conjunctiva/corneas - muddy     Ears:  Normal external ear canals, both ears Nose:  G tube - no Throat:  ETT TUBE - no , OG tube - no Neck:  Supple,  No enlargement/tenderness/nodules Lungs: Clear to auscultation bilaterally (Rt breast swollen as visible externally with her clothes on) Heart:  S1 and S2 normal, no murmur, CVP - no.  Pressors - n Abdomen:  Soft, no masses, no organomegaly Genitalia / Rectal:  Not done Extremities:  Extremities- RUE lymphedema Skin:  ntact in exposed areas . Sacral area - not examined Neurologic:  Sedation - none -> RASS - +1 . Moves all 4s - yes. CAM-ICU - neg . Orientation - x3     Actual CT reviewed and discussed with patient Resolved Hospital Problem list     Assessment & Plan:  Likely malignant pleural effusion - Very  large Right pleura effusion. Sp thora 03/23/22 with relief   6/26- she is not keen on pleurx or VATS. Prefers observation and repeat t hora for now. Wants to see how chemio would help her and then at followup take shared decision making  Plan  - continue clinical obs: possible that effusion shinrks with chemo but more likely in days to weeks it recurs and she gets class3-4 dyspnea-> at that point she wants repeat thora before deciding furhter on pleurx  Followup  - ccm will arrange opd followup - message sent to our scheduler   CCM will sign off   .  SIGNATURE    Dr. Kalman Shan, M.D., F.C.C.P,  Pulmonary and Critical Care Medicine Staff Physician, [facility] Director - Interstitial Lung Disease  Program  Medical Director - [facility] Pulmonary Fibrosis [facility] at [place], [place], [address]  NPI Number:  NPI #[id number] [facility] Number: [id number]  Pager: [phone], If no answer  -> Check AMION or Try [phone] Telephone (clinical office): [phone] Telephone (research): [phone]  12:32 PM 03/24/2022

## 2022-03-24 NOTE — Progress Notes (Signed)
I reviewed findings with her mother and discussed plan of care I will call her again tomorrow for update

## 2022-03-25 ENCOUNTER — Other Ambulatory Visit (HOSPITAL_COMMUNITY): Payer: Self-pay

## 2022-03-25 ENCOUNTER — Other Ambulatory Visit: Payer: Self-pay | Admitting: Hematology and Oncology

## 2022-03-25 DIAGNOSIS — E440 Moderate protein-calorie malnutrition: Secondary | ICD-10-CM | POA: Insufficient documentation

## 2022-03-25 DIAGNOSIS — C792 Secondary malignant neoplasm of skin: Secondary | ICD-10-CM | POA: Diagnosis not present

## 2022-03-25 DIAGNOSIS — C50911 Malignant neoplasm of unspecified site of right female breast: Secondary | ICD-10-CM | POA: Diagnosis not present

## 2022-03-25 DIAGNOSIS — D509 Iron deficiency anemia, unspecified: Secondary | ICD-10-CM

## 2022-03-25 DIAGNOSIS — J90 Pleural effusion, not elsewhere classified: Secondary | ICD-10-CM | POA: Diagnosis not present

## 2022-03-25 LAB — CBC WITH DIFFERENTIAL/PLATELET
Abs Immature Granulocytes: 0.04 10*3/uL (ref 0.00–0.07)
Basophils Absolute: 0.1 10*3/uL (ref 0.0–0.1)
Basophils Relative: 1 %
Eosinophils Absolute: 0.3 10*3/uL (ref 0.0–0.5)
Eosinophils Relative: 3 %
HCT: 24.7 % — ABNORMAL LOW (ref 36.0–46.0)
Hemoglobin: 7.7 g/dL — ABNORMAL LOW (ref 12.0–15.0)
Immature Granulocytes: 1 %
Lymphocytes Relative: 13 %
Lymphs Abs: 1.1 10*3/uL (ref 0.7–4.0)
MCH: 22.7 pg — ABNORMAL LOW (ref 26.0–34.0)
MCHC: 31.2 g/dL (ref 30.0–36.0)
MCV: 72.9 fL — ABNORMAL LOW (ref 80.0–100.0)
Monocytes Absolute: 0.6 10*3/uL (ref 0.1–1.0)
Monocytes Relative: 8 %
Neutro Abs: 6.2 10*3/uL (ref 1.7–7.7)
Neutrophils Relative %: 74 %
Platelets: 505 10*3/uL — ABNORMAL HIGH (ref 150–400)
RBC: 3.39 MIL/uL — ABNORMAL LOW (ref 3.87–5.11)
RDW: 16.6 % — ABNORMAL HIGH (ref 11.5–15.5)
WBC: 8.2 10*3/uL (ref 4.0–10.5)
nRBC: 0.2 % (ref 0.0–0.2)

## 2022-03-25 LAB — BASIC METABOLIC PANEL
Anion gap: 6 (ref 5–15)
BUN: 28 mg/dL — ABNORMAL HIGH (ref 8–23)
CO2: 22 mmol/L (ref 22–32)
Calcium: 8.3 mg/dL — ABNORMAL LOW (ref 8.9–10.3)
Chloride: 113 mmol/L — ABNORMAL HIGH (ref 98–111)
Creatinine, Ser: 0.97 mg/dL (ref 0.44–1.00)
GFR, Estimated: >60 mL/min (ref >60)
Glucose, Bld: 105 mg/dL — ABNORMAL HIGH (ref 70–99)
Potassium: 3.8 mmol/L (ref 3.5–5.1)
Sodium: 141 mmol/L (ref 135–145)

## 2022-03-25 LAB — VITAMIN D 25 HYDROXY (VIT D DEFICIENCY, FRACTURES): Vit D, 25-Hydroxy: 41.17 ng/mL (ref 30–100)

## 2022-03-25 LAB — PREPARE RBC (CROSSMATCH)

## 2022-03-25 MED ORDER — METRONIDAZOLE 0.75 % EX GEL
Freq: Every day | CUTANEOUS | 0 refills | Status: DC
  Filled 2022-03-25: qty 45, 30d supply, fill #0

## 2022-03-25 MED ORDER — SODIUM CHLORIDE 0.9% IV SOLUTION: Freq: Once | INTRAVENOUS | Status: AC

## 2022-03-25 MED ORDER — NORMAL SALINE FLUSH 0.9 % IV SOLN
10.0000 mL | Freq: Every day | INTRAVENOUS | 1 refills | Status: DC
  Filled 2022-03-25: qty 600, 30d supply, fill #0

## 2022-03-25 MED ORDER — DIPHENHYDRAMINE HCL 25 MG PO CAPS
25.0000 mg | ORAL_CAPSULE | Freq: Once | ORAL | Status: AC
  Administered 2022-03-25: 25 mg via ORAL
  Filled 2022-03-25: qty 1

## 2022-03-25 MED ORDER — MORPHINE SULFATE (PF) 4 MG/ML IV SOLN: 4.0000 mg | INTRAVENOUS | Status: DC | PRN

## 2022-03-25 MED ORDER — HYDROMORPHONE HCL 4 MG PO TABS
4.0000 mg | ORAL_TABLET | ORAL | Status: DC | PRN
  Administered 2022-03-25 – 2022-03-27 (×4): 4 mg via ORAL
  Filled 2022-03-25 (×4): qty 1

## 2022-03-25 MED ORDER — ACETAMINOPHEN 325 MG PO TABS
650.0000 mg | ORAL_TABLET | Freq: Once | ORAL | Status: AC
  Administered 2022-03-25: 650 mg via ORAL
  Filled 2022-03-25: qty 2

## 2022-03-25 MED ORDER — PNEUMOCOCCAL 20-VAL CONJ VACC 0.5 ML IM SUSY
0.5000 mL | PREFILLED_SYRINGE | INTRAMUSCULAR | Status: AC
  Administered 2022-03-26: 0.5 mL via INTRAMUSCULAR
  Filled 2022-03-25: qty 0.5

## 2022-03-25 MED ORDER — DOXYCYCLINE HYCLATE 100 MG PO CAPS
100.0000 mg | ORAL_CAPSULE | Freq: Two times a day (BID) | ORAL | 0 refills | Status: AC
  Filled 2022-03-25: qty 28, 14d supply, fill #0

## 2022-03-25 MED ORDER — MELATONIN 3 MG PO TABS: 3.0000 mg | ORAL_TABLET | Freq: Every evening | ORAL | 0 refills | Status: AC | PRN

## 2022-03-25 MED ORDER — HYDROMORPHONE HCL 2 MG PO TABS
2.0000 mg | ORAL_TABLET | ORAL | Status: DC | PRN
  Administered 2022-03-25: 2 mg via ORAL
  Filled 2022-03-25: qty 1

## 2022-03-25 MED ORDER — HYDROMORPHONE HCL 2 MG PO TABS
2.0000 mg | ORAL_TABLET | ORAL | 0 refills | Status: DC | PRN
Start: 2022-03-25 — End: 2022-03-31
  Filled 2022-03-25: qty 30, 5d supply, fill #0

## 2022-03-25 MED ORDER — MEGESTROL ACETATE 40 MG PO TABS
40.0000 mg | ORAL_TABLET | Freq: Two times a day (BID) | ORAL | 0 refills | Status: AC
  Filled 2022-03-25: qty 60, 30d supply, fill #0

## 2022-03-26 ENCOUNTER — Other Ambulatory Visit (HOSPITAL_COMMUNITY): Payer: Self-pay

## 2022-03-26 DIAGNOSIS — C50911 Malignant neoplasm of unspecified site of right female breast: Secondary | ICD-10-CM | POA: Diagnosis not present

## 2022-03-26 LAB — TYPE AND SCREEN
ABO/RH(D): AB POS
Antibody Screen: NEGATIVE
Unit division: 0
Unit division: 0

## 2022-03-26 LAB — CBC WITH DIFFERENTIAL/PLATELET
Abs Immature Granulocytes: 0.03 10*3/uL (ref 0.00–0.07)
Basophils Absolute: 0.1 10*3/uL (ref 0.0–0.1)
Basophils Relative: 1 %
Eosinophils Absolute: 0.4 10*3/uL (ref 0.0–0.5)
Eosinophils Relative: 5 %
HCT: 28.6 % — ABNORMAL LOW (ref 36.0–46.0)
Hemoglobin: 9.1 g/dL — ABNORMAL LOW (ref 12.0–15.0)
Immature Granulocytes: 0 %
Lymphocytes Relative: 20 %
Lymphs Abs: 1.6 10*3/uL (ref 0.7–4.0)
MCH: 24 pg — ABNORMAL LOW (ref 26.0–34.0)
MCHC: 31.8 g/dL (ref 30.0–36.0)
MCV: 75.5 fL — ABNORMAL LOW (ref 80.0–100.0)
Monocytes Absolute: 0.7 10*3/uL (ref 0.1–1.0)
Monocytes Relative: 9 %
Neutro Abs: 5.3 10*3/uL (ref 1.7–7.7)
Neutrophils Relative %: 65 %
Platelets: 462 10*3/uL — ABNORMAL HIGH (ref 150–400)
RBC: 3.79 MIL/uL — ABNORMAL LOW (ref 3.87–5.11)
RDW: 18.7 % — ABNORMAL HIGH (ref 11.5–15.5)
WBC: 8.1 10*3/uL (ref 4.0–10.5)
nRBC: 0 % (ref 0.0–0.2)

## 2022-03-26 LAB — BPAM RBC
Blood Product Expiration Date: 202307082359
Blood Product Expiration Date: 202307112359
ISSUE DATE / TIME: 202306261322
ISSUE DATE / TIME: 202306271326
Unit Type and Rh: 6200
Unit Type and Rh: 6200

## 2022-03-26 MED ORDER — SODIUM CHLORIDE 0.9% FLUSH: 10.0000 mL | INTRAVENOUS | Status: DC | PRN

## 2022-03-26 MED ORDER — SODIUM CHLORIDE 0.9% FLUSH
10.0000 mL | Freq: Two times a day (BID) | INTRAVENOUS | Status: DC
  Administered 2022-03-27: 10 mL
  Administered 2022-03-27: 20 mL

## 2022-03-26 NOTE — Plan of Care (Signed)
  Problem: Education: Goal: Knowledge of General Education information will improve Description: Including pain rating scale, medication(s)/side effects and non-pharmacologic comfort measures Outcome: Adequate for Discharge   Problem: Health Behavior/Discharge Planning: Goal: Ability to manage health-related needs will improve Outcome: Adequate for Discharge   Problem: Clinical Measurements: Goal: Ability to maintain clinical measurements within normal limits will improve Outcome: Adequate for Discharge Goal: Will remain free from infection Outcome: Adequate for Discharge Goal: Diagnostic test results will improve Outcome: Adequate for Discharge Goal: Respiratory complications will improve Outcome: Adequate for Discharge Goal: Cardiovascular complication will be avoided Outcome: Adequate for Discharge   Problem: Activity: Goal: Risk for activity intolerance will decrease Outcome: Adequate for Discharge   Problem: Nutrition: Goal: Adequate nutrition will be maintained Outcome: Adequate for Discharge   Problem: Coping: Goal: Level of anxiety will decrease Outcome: Adequate for Discharge   Problem: Elimination: Goal: Will not experience complications related to bowel motility Outcome: Adequate for Discharge Goal: Will not experience complications related to urinary retention Outcome: Adequate for Discharge   Problem: Pain Managment: Goal: General experience of comfort will improve Outcome: Adequate for Discharge   Problem: Safety: Goal: Ability to remain free from injury will improve Outcome: Adequate for Discharge   Problem: Skin Integrity: Goal: Risk for impaired skin integrity will decrease Outcome: Adequate for Discharge   Problem: Clinical Measurements: Goal: Ability to avoid or minimize complications of infection will improve Outcome: Adequate for Discharge   Problem: Skin Integrity: Goal: Skin integrity will improve Outcome: Adequate for Discharge   

## 2022-03-26 NOTE — Progress Notes (Signed)
Patient stable. Plan to keep patient overnight to continue education with teaching. Discussed with CM, Alinda Sierras no option for private pay, company wont accept that. Family wont be able to help patient, sister lives out of town. Family relates why to remove picc line if she will have treatment on Monday. Plan to keep patient for further education.

## 2022-03-26 NOTE — Progress Notes (Signed)
RN provided education on how to care for PICC line, flushes, hand hygiene. Patient demonstrated understanding by demonstration, she was able to expel air from saline flush, chg picc line, connect saline flush and flushed.pt. clamped PICC LINE and connector tubing.

## 2022-03-26 NOTE — Progress Notes (Signed)
Labs drawn. Pt tolerated with no problem.

## 2022-03-26 NOTE — Progress Notes (Signed)
Patient's family member (sister & mother) were at the bedside. They stated that they can't help her for PICC line flush which needs twice a day. Patient's Rt. Hand swelling which makes difficult to NS flush by herself. Attached extended tube both lumens. Demonstrated how to flush the PICC line to patient and she understood it well, but need more practice due to swollen hand and difficult to coordinate syringe and extend tube. Dr. Tyrell Antonio notified this matter and patient can't be discharge today and need one more day to practice rather than remove PICC. Dr. Tyrell Antonio agreed with it and patient is staying today. HS [employer]

## 2022-03-26 NOTE — TOC Transition Note (Signed)
Transition of Care [facility]) - CM/SW Discharge Note   Patient Details  Name: [name] MRN: [id number] Date of Birth: [birth year]  [facility] CM/SW Contact:  Lynnell Catalan, RN Phone Number: 03/26/2022, 10:51 AM   Clinical Narrative:     Pt from home alone. Pt to dc with WOC wound care recommendations as follows:  Apply Metronidazole gel to xeroform gauze sheets Q day or PRN when soiled, then cover right breast wound with these. Cover with ABD pads and hold in place with mesh underwear. This type of topical dressing change would not qualify for a home health RN. Unsure at this time if pt will discharge with PICC line. She will need to also learn how to flush Picc at home as home health will not come to do this for pt. MD and nursing staff aware that pt or family will need to learn how to do dressing changes and Picc flush.

## 2022-03-26 NOTE — Progress Notes (Signed)
Talked to Dr. Tyrell Antonio regarding teaching managing PICC line. Patient hasn't had any experience of caring PICC line. Patient's family will come around 2 pm. Dr. Tyrell Antonio didn't know about who is going to change PICC dressing since patient doesn't have home care. Talked to Dr. Alvy Bimler regarding this and cancer center will change dressing if family or patient is possible to manage PICC line. If not, remove PICC line before she discharge to home. HS [employer]

## 2022-03-26 NOTE — Progress Notes (Signed)
Discharge order placed, meds ready from pharmacy,. Patient verbalized understanding and demonstration about R Breast dressing changed. Supplies provided. Awaiting for family members at 2pm for PICC line care teachings from IV team.

## 2022-03-27 DIAGNOSIS — C50911 Malignant neoplasm of unspecified site of right female breast: Secondary | ICD-10-CM | POA: Diagnosis not present

## 2022-03-27 LAB — BODY FLUID CULTURE W GRAM STAIN: Culture: NO GROWTH

## 2022-03-27 LAB — CULTURE, BLOOD (ROUTINE X 2)
Culture: NO GROWTH
Culture: NO GROWTH
Special Requests: ADEQUATE
Special Requests: ADEQUATE

## 2022-03-27 NOTE — Progress Notes (Signed)
Discharge instructions reviewed with patient and family at bedside.  Family stated that they have someone to manage daily flushed for DL PICC that pt is being discharged with for future treatment.

## 2022-03-27 NOTE — Progress Notes (Signed)
RN provided education on hand hygiene, dressing change procedure, and PICC line flushing/maintenance. Pt demonstrated difficult applying dressing d/t limited mobility/dexterity w/ RUE. Due to PICC being in the LUE and length of extension tubing, pt can only use RUE for PICC maintenance. Pt unable to attach syringe to PICC w/ RUE lack of dexterity r/t edema. Multiple copies of printed care instructions provided to pt.

## 2022-03-27 NOTE — Progress Notes (Signed)
PROGRESS NOTE    [name]  WLN:[id number] DOB: 08-06-1961 DOA: 03/22/2022 PCP: Pcp, No   Brief Narrative: 61 year old who presents to the ED with neglected nonhealing necrotic and fungating mass on her right breast and right hand swelling, accompanied with shortness of breath and pain all over her chest.  She has had abnormal swelling of her right breast for many years, she had been developing ulceration and necrosis for many months.  She presented with worsening pain and shortness of breath.  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  Neglected fungating metastatic breast tumor.  Biopsy pending.  She was treated with vancomycin for superimposed infection.  She was transitioned to doxycycline to treat for 2 weeks.  She underwent a skin nodule biopsy of the right chest.  Port placement was not able to be placed due to significant spread of the cancer to the chest.  Underwent thoracentesis yielding 1.8 L fluid with improvement of her symptoms. PICC line was placed.  She will remain in the hospital for the last 2 days to try to arrange care for PICC line and educate patient about care.  Family today was able to get a nurse to go to patient's house to do the flushes.  Anemia of chronic disease.  She received blood transfusion.  Received IV iron. Her hemoglobin has remained stable at 9.  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  Neglected fungating metastatic breast tumor.  Underwent thoracentesis.  Biopsy/  She will follow up with Dr Alvy Bimler for further care.  She will be discharge with Picc line. Family has arrange a nurse to take care of picc line  at home./   She will be discharge on oral Doxycycline.   See Discharge summary done 6/27 Nutrition Problem: Moderate Malnutrition Etiology: chronic illness, cancer and cancer related treatments    Signs/Symptoms: mild fat depletion, mild muscle depletion    Interventions: Ensure Enlive (each supplement provides 350kcal and 20 grams of protein)  Estimated body mass index is 21.8 kg/m as calculated from the following:   Height as of this encounter: 5' 6.5" (1.689 m).   Weight as of this encounter: 62.2 kg.   DVT prophylaxis: scd Code Status: Full code  Disposition Plan: discharge home today.      Consultants:  Dr Alvy Bimler.    Antimicrobials:    Subjective: She is doing well, no significant changes.  Family has arrange a Nurse to go to patient house and do flushes for picc line.   Objective: Vitals:   03/26/22 0506 03/26/22 1508 03/26/22 2155 03/27/22 0512  BP: 105/71 110/68 107/60 104/61  Pulse: 99 97 82 (!) 102  Resp: '16 18 14 16  '$ Temp: 98.8 F (37.1 C) 98.2 F (36.8 C) 98.8 F (37.1 C) 98.2 F (36.8 C)  TempSrc: Oral Oral Oral Oral  SpO2: 93% 96% 96% 94%  Weight:      Height:        Intake/Output Summary (Last 24 hours) at 03/27/2022 1258 Last data filed at 03/27/2022 0600 Gross per 24 hour  Intake 20 ml  Output --  Net 20 ml   [employer]  03/23/22 0015  Weight: 62.2 kg    Examination:  General exam: Appears calm and comfortable  Respiratory system: Clear to auscultation. Respiratory effort normal. Cardiovascular system: S1 & S2 heard, RRR.  Gastrointestinal system: Abdomen is nondistended, soft and nontender. No organomegaly or masses felt. Normal bowel sounds heard. Central nervous system: Alert and oriented.  Extremities: Right arm swelling  Skin; dressing breast area, right   Data Reviewed: I have personally reviewed following labs and imaging studies  CBC: Recent Labs  Lab 03/22/22 1740 03/23/22 0012 03/24/22 0326  03/25/22 0837 03/26/22 0940  WBC 9.8 8.5 8.3 8.2 8.1  NEUTROABS 8.6*  --   --  6.2 5.3  HGB 7.5* 7.2* 6.7* 7.7* 9.1*  HCT 24.3* 23.2* 21.8* 24.7* 28.6*  MCV 70.6* 71.2* 70.6* 72.9* 75.5*  PLT 629* 576* 533* 505* 008*   Basic Metabolic Panel: Recent Labs  Lab 03/22/22 1740 03/23/22 0012 03/24/22 0326 03/25/22 0839  NA 141 140  --  141  K 3.7 4.1  --  3.8  CL 108 110  --  113*  CO2 23 23  --  22  GLUCOSE 133* 113*  --  105*  BUN 29* 26*  --  28*  CREATININE 1.46* 1.17* 1.11* 0.97  CALCIUM 8.8* 8.4*  --  8.3*   GFR: Estimated Creatinine Clearance: 58.2 mL/min (by C-G formula based on SCr of 0.97 mg/dL). Liver Function Tests: Recent Labs  Lab 03/23/22 0012  AST 42*  ALT 14  ALKPHOS 72  BILITOT 0.6  PROT 6.1*  ALBUMIN 2.5*   No results for input(s): "LIPASE", "AMYLASE" in the last 168 hours. No results for input(s): "AMMONIA" in the last 168 hours. Coagulation Profile: No results for input(s): "INR", "PROTIME" in the last 168 hours. Cardiac Enzymes: No results for input(s): "CKTOTAL", "CKMB", "CKMBINDEX", "TROPONINI" in the last 168 hours. BNP (last 3 results) No results for input(s): "PROBNP" in the last 8760 hours. HbA1C: No results for input(s): "HGBA1C" in the last 72 hours. CBG: No results for input(s): "GLUCAP" in the last 168 hours. Lipid Profile: No results for input(s): "CHOL", "HDL", "LDLCALC", "TRIG", "CHOLHDL", "LDLDIRECT" in the last 72 hours. Thyroid Function Tests: No results for input(s): "TSH", "T4TOTAL", "FREET4", "T3FREE", "THYROIDAB" in the last 72 hours. Anemia Panel: No results for input(s): "VITAMINB12", "FOLATE", "FERRITIN", "TIBC", "IRON", "RETICCTPCT" in the last 72 hours. Sepsis Labs: Recent Labs  Lab 03/22/22 1813 03/23/22 0011 03/24/22 0326 03/24/22 0656  LATICACIDVEN 3.1* 1.4 0.9 1.4    Recent Results (from the past 240 hour(s))  Blood culture (routine x 2)     Status: None   Collection Time: 03/22/22  5:40 PM   Specimen:  BLOOD  Result Value Ref Range Status   Specimen Description   Final    BLOOD LEFT ANTECUBITAL Performed at [facility], [place] [address], [place], [place] [address]    Special Requests   Final    BOTTLES DRAWN AEROBIC AND ANAEROBIC Blood Culture adequate volume Performed at [place] [address], [place], [place] [address]    Culture   Final    NO GROWTH 5 DAYS Performed at [facility], [place] [address] [place], [place] [address]    Report Status 03/27/2022 FINAL  Final  Resp Panel by RT-PCR (Flu A&B, Covid)     Status: None   Collection Time: 03/22/22  5:48 PM   Specimen: Nasal Swab  Result Value Ref Range Status   SARS Coronavirus 2 by  RT PCR NEGATIVE NEGATIVE Final    Comment: (NOTE) SARS-CoV-2 target nucleic acids are NOT DETECTED.  The SARS-CoV-2 RNA is generally detectable in upper respiratory specimens during the acute phase of infection. The lowest concentration of SARS-CoV-2 viral copies this assay can detect is 138 copies/mL. A negative result does not preclude SARS-Cov-2 infection and should not be used as the sole basis for treatment or other patient management decisions. A negative result may occur with  improper specimen collection/handling, submission of specimen other than nasopharyngeal swab, presence of viral mutation(s) within the areas targeted by this assay, and inadequate number of viral copies(<138 copies/mL). A negative result must be combined with clinical observations, patient history, and epidemiological information. The expected result is Negative.  Fact Sheet for Patients:  EntrepreneurPulse.com.au  Fact Sheet for Healthcare Providers:  IncredibleEmployment.be  This test is no t yet approved or cleared by the [place] FDA and  has been authorized for detection and/or diagnosis of SARS-CoV-2 by FDA under an Emergency Use Authorization (EUA). This EUA will  remain  in effect (meaning this test can be used) for the duration of the COVID-19 declaration under Section 564(b)(1) of the Act, 21 U.S.C.section 360bbb-3(b)(1), unless the authorization is terminated  or revoked sooner.       Influenza A by PCR NEGATIVE NEGATIVE Final   Influenza B by PCR NEGATIVE NEGATIVE Final    Comment: (NOTE) The Xpert Xpress SARS-CoV-2/FLU/RSV plus assay is intended as an aid in the diagnosis of influenza from Nasopharyngeal swab specimens and should not be used as a sole basis for treatment. Nasal washings and aspirates are unacceptable for Xpert Xpress SARS-CoV-2/FLU/RSV testing.  Fact Sheet for Patients: EntrepreneurPulse.com.au  Fact Sheet for Healthcare Providers: IncredibleEmployment.be  This test is not yet approved or cleared by the [place] FDA and has been authorized for detection and/or diagnosis of SARS-CoV-2 by FDA under an Emergency Use Authorization (EUA). This EUA will remain in effect (meaning this test can be used) for the duration of the COVID-19 declaration under Section 564(b)(1) of the Act, 21 U.S.C. section 360bbb-3(b)(1), unless the authorization is terminated or revoked.  Performed at [facility], [place] [address] [place], [place] [address]   Blood culture (routine x 2)     Status: None   Collection Time: 03/22/22  6:00 PM   Specimen: BLOOD  Result Value Ref Range Status   Specimen Description   Final    BLOOD RIGHT ANTECUBITAL Performed at [place] [address], [place], [place] [address]    Special Requests   Final    BOTTLES DRAWN AEROBIC AND ANAEROBIC Blood Culture adequate volume Performed at [place] [address] [place], [place] [address]    Culture   Final    NO GROWTH 5 DAYS Performed at [facility], [place] [address], [place], [place] [address]    Report Status 03/27/2022 FINAL  Final  Body  fluid culture w Gram Stain     Status: None   Collection Time: 03/23/22  1:06 PM   Specimen: Pleural Fluid  Result Value Ref Range Status   Specimen Description   Final    FLUID BODY Performed at [place] [address] [place], [place] [address]    Special Requests   Final    NONE Performed at [facility], [place] [address] [place], [place] [address]    Gram Stain   Final    RARE WBC  PRESENT, PREDOMINANTLY MONONUCLEAR NO ORGANISMS SEEN    Culture   Final    NO GROWTH 3 DAYS Performed at [facility], [place] [address] [place], [place] [address]    Report Status 03/27/2022 FINAL  Final  Fungus Culture With Stain     Status: None (Preliminary result)   Collection Time: 03/23/22  1:07 PM   Specimen: Pleural Fluid  Result Value Ref Range Status   Fungus Stain Final report  Final    Comment: (NOTE) Performed At: [facility] [place], [place] [id number] Rush Farmer MD [id number]    Fungus (Mycology) Culture PENDING  Incomplete   Fungal Source PLEURAL  Final    Comment: FLUID Performed at [facility], [place] [address] [place], [place] [address]   Fungus Culture Result     Status: None   Collection Time: 03/23/22  1:07 PM  Result Value Ref Range Status   Result 1 Comment  Final    Comment: (NOTE) KOH/Calcofluor preparation:  no fungus observed. Performed At: [facility] [place], [place] [id number] Rush Farmer MD [id number]          Radiology Studies: No results found.      Scheduled Meds:  Chlorhexidine Gluconate Cloth  6 each Topical Daily   enoxaparin (LOVENOX) injection  40 mg Subcutaneous Daily   feeding supplement  237 mL Oral TID BM   megestrol  40 mg Oral BID   metroNIDAZOLE   Topical Daily   multivitamin with minerals  1 tablet Oral Daily   polyethylene glycol  17 g Oral Daily   sodium chloride flush  10-40 mL Intracatheter  Q12H   Continuous Infusions:  vancomycin 1,000 mg (03/27/22 1012)     LOS: 4 days    Time spent: 35 minutes    Fionnuala Hemmerich A Keric Zehren, MD [facility]   If 7PM-7AM, please contact night-coverage [email or web address]  03/27/2022, 12:58 PM

## 2022-03-28 ENCOUNTER — Other Ambulatory Visit: Payer: Self-pay | Admitting: Hematology and Oncology

## 2022-03-28 DIAGNOSIS — D509 Iron deficiency anemia, unspecified: Secondary | ICD-10-CM

## 2022-03-28 DIAGNOSIS — C50911 Malignant neoplasm of unspecified site of right female breast: Secondary | ICD-10-CM

## 2022-03-29 ENCOUNTER — Encounter (HOSPITAL_COMMUNITY): Payer: Self-pay

## 2022-03-29 ENCOUNTER — Other Ambulatory Visit: Payer: Self-pay

## 2022-03-29 ENCOUNTER — Emergency Department (HOSPITAL_COMMUNITY)
Admission: EM | Admit: 2022-03-29 | Discharge: 2022-03-30 | Disposition: A | Discharge status: Home / Self Care | Payer: BC Managed Care – PPO | Attending: Emergency Medicine | Admitting: Emergency Medicine

## 2022-03-29 DIAGNOSIS — T82868A Thrombosis of vascular prosthetic devices, implants and grafts, initial encounter: Secondary | ICD-10-CM | POA: Insufficient documentation

## 2022-03-29 DIAGNOSIS — T82898A Other specified complication of vascular prosthetic devices, implants and grafts, initial encounter: Secondary | ICD-10-CM

## 2022-03-29 DIAGNOSIS — Z853 Personal history of malignant neoplasm of breast: Secondary | ICD-10-CM | POA: Diagnosis not present

## 2022-03-29 MED ORDER — METRONIDAZOLE 1 % EX GEL
Freq: Every day | CUTANEOUS | 0 refills | Status: DC
  Filled 2022-03-29: qty 60, 30d supply, fill #0

## 2022-03-29 MED ORDER — CHLORHEXIDINE GLUCONATE CLOTH 2 % EX PADS: 6.0000 | MEDICATED_PAD | Freq: Every day | CUTANEOUS | Status: DC

## 2022-03-29 MED ORDER — SODIUM CHLORIDE 0.9% FLUSH: 10.0000 mL | INTRAVENOUS | Status: DC | PRN

## 2022-03-29 MED ORDER — METRONIDAZOLE 0.75 % EX GEL
Freq: Once | CUTANEOUS | Status: AC
  Administered 2022-03-30: 1 via TOPICAL
  Filled 2022-03-29: qty 45

## 2022-03-29 NOTE — Discharge Instructions (Addendum)
Follow up with Dr. Alvy Bimler as scheduled. Refill of metrogel sent to pharmacy already. Can return here for any new/acute changes.

## 2022-03-29 NOTE — ED Triage Notes (Signed)
Pt states that her PICC line will not flush.

## 2022-03-29 NOTE — ED Provider Notes (Signed)
[facility] DEPT Provider Note   CSN: [id number] Arrival date & time: 03/29/22  2137     History {Add pertinent medical, surgical, social history, OB history to HPI:1} Chief Complaint  Patient presents with   Vascular Access Problem    [name] is a 61 y.o. female.  The history is provided by the patient and medical records.   61 year old female with right-sided breast cancer, about to start treatment, presenting to the ED with issues with PICC line.  She was just discharged from this facility 2 days ago and PICC line was flushing fine.  RN came to the house yesterday and had issues with red port, today had issues with purple port and could not get it to flush at all.  She is not currently receiving any medications but is due to start this next week.  She denies any other complaints currently.  Home Medications Prior to Admission medications   Medication Sig Start Date End Date Taking? Authorizing Provider  Sodium Chloride Flush (NORMAL SALINE FLUSH) 0.9 % SOLN Please flush 10 ml to each lumen daily. 03/25/22   Heath Lark, MD  doxycycline (VIBRAMYCIN) 100 MG capsule Take 1 capsule (100 mg total) by mouth 2 (two) times daily for 14 days. 03/25/22 04/09/22  Barb Merino, MD  HYDROmorphone (DILAUDID) 2 MG tablet Take 1-2 tablets (2-4 mg total) by mouth every 4 (four) hours as needed for severe pain or moderate pain (take 1 tablet for pain 4-6, 2 tabs for more severe pain). 03/25/22   Barb Merino, MD  megestrol (MEGACE) 40 MG tablet Take 1 tablet (40 mg total) by mouth 2 (two) times daily. 03/25/22 04/25/22  Barb Merino, MD  melatonin 3 MG TABS tablet Take 1 tablet (3 mg total) by mouth at bedtime as needed. 03/25/22 04/24/22  Barb Merino, MD  metroNIDAZOLE (METROGEL) 0.75 % gel Apply topically to affected area daily. 03/26/22   Barb Merino, MD      Allergies    Patient has no known allergies.    Review of Systems   Review of  Systems  Physical Exam Updated Vital Signs BP 107/77   Pulse (!) 116   Temp 97.9 F (36.6 C) (Oral)   Resp 20   Ht 5' 6.5" (1.689 m)   Wt 62.1 kg   SpO2 100%   BMI 21.78 kg/m  Physical Exam Vitals and nursing note reviewed.  Constitutional:      Appearance: She is well-developed.  HENT:     Head: Normocephalic and atraumatic.  Eyes:     Conjunctiva/sclera: Conjunctivae normal.     Pupils: Pupils are equal, round, and reactive to light.  Cardiovascular:     Rate and Rhythm: Normal rate and regular rhythm.     Heart sounds: Normal heart sounds.  Pulmonary:     Effort: Pulmonary effort is normal.     Breath sounds: Normal breath sounds.  Abdominal:     General: Bowel sounds are normal.     Palpations: Abdomen is soft.  Musculoskeletal:        General: Normal range of motion.     Cervical back: Normal range of motion.     Comments: PICC line left upper arm, site is clean without signs of infection Red port easily flushing with blood return Purple port will not flush and will not give blood return  Skin:    General: Skin is warm and dry.  Neurological:     Mental Status: She is alert and  oriented to person, place, and time.     ED Results / Procedures / Treatments   Labs (all labs ordered are listed, but only abnormal results are displayed) Labs Reviewed - No data to display  EKG None  Radiology No results found.  Procedures Procedures  {Document cardiac monitor, telemetry assessment procedure when appropriate:1}  Medications Ordered in ED Medications - No data to display  ED Course/ Medical Decision Making/ A&P                           Medical Decision Making Risk Prescription drug management.   Unable to flush purple port of PICC line in the ED.  Will consult IV team to troubleshoot.   11:46 PM IV team has assessed-- both ports flushed with good blood return.  Stable for discharge. Final Clinical Impression(s) / ED Diagnoses Final diagnoses:   None    Rx / DC Orders ED Discharge Orders     None

## 2022-03-29 NOTE — ED Notes (Signed)
IV team at bedside 

## 2022-03-30 LAB — CHOLESTEROL, BODY FLUID: Cholesterol, Fluid: 36 mg/dL

## 2022-03-30 MED ORDER — HYDROMORPHONE HCL 2 MG PO TABS
4.0000 mg | ORAL_TABLET | Freq: Once | ORAL | Status: AC
  Administered 2022-03-30: 4 mg via ORAL
  Filled 2022-03-30: qty 2

## 2022-03-30 MED ORDER — HYDROMORPHONE HCL 2 MG PO TABS: 2.0000 mg | ORAL_TABLET | Freq: Once | ORAL | Status: DC

## 2022-03-30 NOTE — ED Notes (Signed)
I provided reinforced discharge education based off of discharge instructions. Pt acknowledged and understood my education. Pt had no further questions/concerns for provider/myself.  °

## 2022-03-31 ENCOUNTER — Other Ambulatory Visit: Payer: Self-pay | Admitting: Hematology and Oncology

## 2022-03-31 ENCOUNTER — Inpatient Hospital Stay: Payer: BC Managed Care – PPO

## 2022-03-31 ENCOUNTER — Inpatient Hospital Stay: Payer: BC Managed Care – PPO | Attending: Hematology and Oncology | Admitting: Hematology and Oncology

## 2022-03-31 ENCOUNTER — Telehealth: Payer: Self-pay | Admitting: Hematology and Oncology

## 2022-03-31 ENCOUNTER — Other Ambulatory Visit (HOSPITAL_COMMUNITY): Payer: Self-pay

## 2022-03-31 ENCOUNTER — Telehealth: Payer: Self-pay

## 2022-03-31 ENCOUNTER — Other Ambulatory Visit: Payer: Self-pay

## 2022-03-31 DIAGNOSIS — G893 Neoplasm related pain (acute) (chronic): Secondary | ICD-10-CM

## 2022-03-31 DIAGNOSIS — Z170 Estrogen receptor positive status [ER+]: Secondary | ICD-10-CM | POA: Insufficient documentation

## 2022-03-31 DIAGNOSIS — I878 Other specified disorders of veins: Secondary | ICD-10-CM

## 2022-03-31 DIAGNOSIS — E440 Moderate protein-calorie malnutrition: Secondary | ICD-10-CM

## 2022-03-31 DIAGNOSIS — C50911 Malignant neoplasm of unspecified site of right female breast: Secondary | ICD-10-CM | POA: Diagnosis present

## 2022-03-31 DIAGNOSIS — D509 Iron deficiency anemia, unspecified: Secondary | ICD-10-CM | POA: Diagnosis not present

## 2022-03-31 DIAGNOSIS — K5909 Other constipation: Secondary | ICD-10-CM

## 2022-03-31 DIAGNOSIS — Z79811 Long term (current) use of aromatase inhibitors: Secondary | ICD-10-CM | POA: Insufficient documentation

## 2022-03-31 DIAGNOSIS — J90 Pleural effusion, not elsewhere classified: Secondary | ICD-10-CM | POA: Diagnosis not present

## 2022-03-31 DIAGNOSIS — Z79899 Other long term (current) drug therapy: Secondary | ICD-10-CM | POA: Diagnosis not present

## 2022-03-31 DIAGNOSIS — C7951 Secondary malignant neoplasm of bone: Secondary | ICD-10-CM

## 2022-03-31 DIAGNOSIS — Z659 Problem related to unspecified psychosocial circumstances: Secondary | ICD-10-CM

## 2022-03-31 LAB — CBC WITH DIFFERENTIAL/PLATELET
Abs Immature Granulocytes: 0.03 10*3/uL (ref 0.00–0.07)
Basophils Absolute: 0.1 10*3/uL (ref 0.0–0.1)
Basophils Relative: 1 %
Eosinophils Absolute: 0 10*3/uL (ref 0.0–0.5)
Eosinophils Relative: 0 %
HCT: 29.7 % — ABNORMAL LOW (ref 36.0–46.0)
Hemoglobin: 9.6 g/dL — ABNORMAL LOW (ref 12.0–15.0)
Immature Granulocytes: 0 %
Lymphocytes Relative: 14 %
Lymphs Abs: 1.3 10*3/uL (ref 0.7–4.0)
MCH: 23.6 pg — ABNORMAL LOW (ref 26.0–34.0)
MCHC: 32.3 g/dL (ref 30.0–36.0)
MCV: 73 fL — ABNORMAL LOW (ref 80.0–100.0)
Monocytes Absolute: 0.5 10*3/uL (ref 0.1–1.0)
Monocytes Relative: 6 %
Neutro Abs: 7.2 10*3/uL (ref 1.7–7.7)
Neutrophils Relative %: 79 %
Platelets: 454 10*3/uL — ABNORMAL HIGH (ref 150–400)
RBC: 4.07 MIL/uL (ref 3.87–5.11)
RDW: 20.5 % — ABNORMAL HIGH (ref 11.5–15.5)
WBC: 9.1 10*3/uL (ref 4.0–10.5)
nRBC: 0 % (ref 0.0–0.2)

## 2022-03-31 LAB — COMPREHENSIVE METABOLIC PANEL
ALT: 12 U/L (ref 0–44)
AST: 36 U/L (ref 15–41)
Albumin: 2.6 g/dL — ABNORMAL LOW (ref 3.5–5.0)
Alkaline Phosphatase: 73 U/L (ref 38–126)
Anion gap: 8 (ref 5–15)
BUN: 24 mg/dL — ABNORMAL HIGH (ref 8–23)
CO2: 21 mmol/L — ABNORMAL LOW (ref 22–32)
Calcium: 8.2 mg/dL — ABNORMAL LOW (ref 8.9–10.3)
Chloride: 109 mmol/L (ref 98–111)
Creatinine, Ser: 1.15 mg/dL — ABNORMAL HIGH (ref 0.44–1.00)
GFR, Estimated: 54 mL/min — ABNORMAL LOW (ref >60)
Glucose, Bld: 103 mg/dL — ABNORMAL HIGH (ref 70–99)
Potassium: 4.2 mmol/L (ref 3.5–5.1)
Sodium: 138 mmol/L (ref 135–145)
Total Bilirubin: 0.4 mg/dL (ref 0.3–1.2)
Total Protein: 6.1 g/dL — ABNORMAL LOW (ref 6.5–8.1)

## 2022-03-31 LAB — SAMPLE TO BLOOD BANK

## 2022-03-31 LAB — SURGICAL PATHOLOGY

## 2022-03-31 MED ORDER — HYDROMORPHONE HCL 8 MG PO TABS
8.0000 mg | ORAL_TABLET | ORAL | 0 refills | Status: DC | PRN
  Filled 2022-03-31: qty 60, 10d supply, fill #0

## 2022-03-31 MED ORDER — SODIUM CHLORIDE 0.9% FLUSH
10.0000 mL | Freq: Once | INTRAVENOUS | Status: AC
  Administered 2022-03-31: 10 mL

## 2022-03-31 MED ORDER — HEPARIN SOD (PORK) LOCK FLUSH 100 UNIT/ML IV SOLN
500.0000 [IU] | Freq: Once | INTRAVENOUS | Status: AC
  Administered 2022-03-31: 500 [IU]

## 2022-03-31 NOTE — Telephone Encounter (Signed)
Returned call to [employer]. She is asking if Israa has dietary restrictions for today's visit. Left message that the visit has no dietary restrictions.

## 2022-03-31 NOTE — Progress Notes (Signed)
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 I am concerned about maintaining PICC line patency I am in favor of prescribing chemotherapy treatment with Xeloda unless if additional tissue confirm HER2 positive disease, in that scenario, she could benefit from Taxotere, Herceptin and Perjeta We will continue supportive care in the meantime   Breast cancer metastasized to bone, right ([place]) We will continue pain management I recommend dental clearance before we proceed with Zometa  Malnutrition of moderate degree We discussed importance of frequent small meals I will schedule dietitian follow-up  Pleural effusion I plan to repeat chest x-ray for evaluation and rule out the need for repeat thoracentesis  Microcytic anemia Her blood count is stable She does not need transfusion support  No orders of the defined types were placed in this encounter.   All questions were answered. The patient knows to call the clinic with any problems, questions or concerns. The total time spent in the appointment was 40 minutes encounter with patients including review of chart and various tests results, discussions about plan of care and coordination of care plan   Heath Lark, MD 03/31/2022 10:42 AM  INTERVAL HISTORY: Please see below for problem oriented charting. she returns for hospital follow-up due to recent diagnosis of stage IV metastatic breast cancer Over the weekend, she went to the ER due to malfunctioning PICC line  REVIEW OF SYSTEMS:   Constitutional: Denies fevers, chills or abnormal weight loss Eyes: Denies blurriness of vision Ears, nose, mouth, throat, and face: Denies mucositis or sore throat Respiratory: Denies cough, dyspnea or  wheezes Cardiovascular: Denies palpitation, chest discomfort or lower extremity swelling Gastrointestinal:  Denies nausea, heartburn or change in bowel habits Skin: Denies abnormal skin rashes Lymphatics: Denies new lymphadenopathy or easy bruising Neurological:Denies numbness, tingling or new weaknesses Behavioral/Psych: Mood is stable, no new changes  All other systems were reviewed with the patient and are negative.  I have reviewed the past medical history, past surgical history, social history and family history with the patient and they are unchanged from previous note.  ALLERGIES:  has No Known Allergies.  MEDICATIONS:  Current Outpatient Medications  Medication Sig Dispense Refill   Sodium Chloride Flush (NORMAL SALINE FLUSH) 0.9 % SOLN Please flush 10 ml to each lumen daily. 300 mL 1   doxycycline (VIBRAMYCIN) 100 MG capsule Take 1 capsule (100 mg total) by mouth 2 (two) times daily for 14 days. 28 capsule 0   HYDROmorphone (DILAUDID) 2 MG tablet Take 1-2 tablets (2-4 mg total) by mouth every 4 (four) hours as needed for severe pain or moderate pain (take 1 tablet for pain 4-6, 2 tabs for more severe pain). 30 tablet 0   megestrol (MEGACE) 40 MG tablet Take 1 tablet (40 mg total) by mouth 2 (two) times daily. 60 tablet 0   melatonin 3 MG TABS tablet Take 1 tablet (3 mg total) by mouth at bedtime as needed.  0   metroNIDAZOLE (METROGEL) 1 % gel Apply topically daily. 60 g 0   No current facility-administered medications for this visit.    SUMMARY OF ONCOLOGIC HISTORY: Oncology History Overview Note  The tumor cells are EQUIVOCAL For Her2 (2+).   Estrogen Receptor:       POSITIVE, 100%, STRONG STAINING  Progesterone  Receptor:   NEGATIVE (0)  Proliferation Marker Ki-67:   45%    Primary breast malignancy ([place])  03/22/2022 Initial Diagnosis   Primary breast malignancy ([place])   03/24/2022 Pathology Results   SURGICAL PATHOLOGY  CASE: [phone]  PATIENT: [name]   Surgical Pathology Report   Clinical History: Ulcerative right breast lesion and probable metastatic breast cancer ([facility])   FINAL MICROSCOPIC DIAGNOSIS:   A. SOFT TISSUE MASS, RIGHT UPPER CHEST WALL, NEEDLE CORE BIOPSY:  - Invasive ductal carcinoma, see comment  - Ductal carcinoma in situ: Not identified   - Tubule formation: Score 3  - Nuclear pleomorphism: Score 3  - Mitotic count: Score 3  - Total score: 9  - Overall Grade: 3  - Lymphovascular invasion: Not identified  - Cancer Length: 13 mm  - Calcifications: Not identified  - Other findings: None    03/28/2022 Cancer Staging   Staging form: Breast, AJCC 8th Edition - Clinical stage from 03/28/2022: Stage IV (cT4d, cN3c, pM1, G3, ER+, PR-, HER2: Equivocal) - Signed by Heath Lark, MD on 03/28/2022 Stage prefix: Initial diagnosis Nuclear grade: G3 Histologic grading system: 3 grade system     PHYSICAL EXAMINATION: ECOG PERFORMANCE STATUS: 2 - Symptomatic, <50% confined to bed  There were no vitals filed for this visit. There were no vitals filed for this visit.  GENERAL:alert, no distress and comfortable SKIN: skin color, texture, turgor are normal, no rashes or significant lesions EYES: normal, Conjunctiva are pink and non-injected, sclera clear OROPHARYNX:no exudate, no erythema and lips, buccal mucosa, and tongue normal  NECK: supple, thyroid normal size, non-tender, without nodularity LYMPH:  no palpable lymphadenopathy in the cervical, axillary or inguinal LUNGS: clear to auscultation and percussion with normal breathing effort HEART: regular rate & rhythm and no murmurs and no lower extremity edema ABDOMEN:abdomen soft, non-tender and normal bowel sounds Musculoskeletal:no cyanosis of digits and no clubbing  NEURO: alert & oriented x 3 with fluent speech, no focal motor/sensory deficits  LABORATORY DATA:  I have reviewed the data as listed    Component Value Date/Time   NA 141 03/25/2022 0839   K 3.8  03/25/2022 0839   CL 113 (H) 03/25/2022 0839   CO2 22 03/25/2022 0839   GLUCOSE 105 (H) 03/25/2022 0839   BUN 28 (H) 03/25/2022 0839   CREATININE 0.97 03/25/2022 0839   CALCIUM 8.3 (L) 03/25/2022 0839   PROT 6.1 (L) 03/23/2022 0012   ALBUMIN 2.5 (L) 03/23/2022 0012   AST 42 (H) 03/23/2022 0012   ALT 14 03/23/2022 0012   ALKPHOS 72 03/23/2022 0012   BILITOT 0.6 03/23/2022 0012   GFRNONAA >60 03/25/2022 0839    No results found for: "SPEP", "UPEP"  Lab Results  Component Value Date   WBC 8.1 03/26/2022   NEUTROABS 5.3 03/26/2022   HGB 9.1 (L) 03/26/2022   HCT 28.6 (L) 03/26/2022   MCV 75.5 (L) 03/26/2022   PLT 462 (H) 03/26/2022      Chemistry      Component Value Date/Time   NA 141 03/25/2022 0839   K 3.8 03/25/2022 0839   CL 113 (H) 03/25/2022 0839   CO2 22 03/25/2022 0839   BUN 28 (H) 03/25/2022 0839   CREATININE 0.97 03/25/2022 0839      Component Value Date/Time   CALCIUM 8.3 (L) 03/25/2022 0839   ALKPHOS 72 03/23/2022 0012   AST 42 (H) 03/23/2022 0012   ALT 14 03/23/2022 0012   BILITOT 0.6 03/23/2022 0012  RADIOGRAPHIC STUDIES: I have personally reviewed the radiological images as listed and agreed with the findings in the report. IR US Guide Bx Asp/Drain  Result Date: 03/24/2022 INDICATION: 61 year old with probable metastatic breast cancer and tissue diagnosis is needed. Patient has a palpable anterior chest wall mass. EXAM: ULTRASOUND-GUIDED CORE BIOPSY OF RIGHT ANTERIOR CHEST WALL MASS MEDICATIONS: 1% lidocaine, local anesthetic ANESTHESIA/SEDATION: None FLUOROSCOPY TIME:  None COMPLICATIONS: None immediate. PROCEDURE: Informed written consent was obtained from the patient after a thorough discussion of the procedural risks, benefits and alternatives. All questions were addressed. A timeout was performed prior to the initiation of the procedure. There is a large soft tissue mass along the medial and anterior upper chest wall. Patient has a dressing  over the ulcerative right breast mass. The right anterior chest wall mass was prepped with chlorhexidine and sterile field was created. Skin was anesthetized using 1% lidocaine. A small incision was made. Using ultrasound guidance, an 18 gauge core device was directed into the lesion. Several core biopsies were obtained. Adequate amount of tissue was obtained. Specimens placed in formalin. Bandage placed over the puncture site. FINDINGS: Slightly heterogeneous superficial mass in the right anterior chest wall. Needle position confirmed within the lesion. IMPRESSION: Ultrasound-guided core biopsy of the right anterior chest wall mass. Electronically Signed   By: Markus Daft M.D.   On: 03/24/2022 18:11   IR PICC PLACEMENT LEFT >5 YRS INC IMG GUIDE  Result Date: 03/24/2022 INDICATION: 61 year old with an ulcerative right breast mass and evidence of metastatic disease. Patient needs a central venous catheter for treatment. Left arm PICC line was selected rather than a Port-A-Cath due to the disease in left breast. EXAM: LEFT ARM PICC LINE PLACEMENT WITH ULTRASOUND AND FLUOROSCOPIC GUIDANCE MEDICATIONS: 1% lidocaine, moderate sedation ANESTHESIA/SEDATION: None FLUOROSCOPY: Radiation Exposure Index (as provided by the fluoroscopic device): 1 mGy Kerma COMPLICATIONS: None immediate. PROCEDURE: The patient was advised of the possible risks and complications and agreed to undergo the procedure. The patient was then brought to the angiographic suite for the procedure. The left arm was prepped with chlorhexidine, draped in the usual sterile fashion using maximum barrier technique (cap and mask, sterile gown, sterile gloves, large sterile sheet, hand hygiene and cutaneous antiseptic). Local anesthesia was attained by infiltration with 1% lidocaine. Ultrasound demonstrated patency of the left basilic vein, and this was documented with an image. Under real-time ultrasound guidance, this vein was accessed with a 21 gauge  micropuncture needle and image documentation was performed. The needle was exchanged over a guidewire for a peel-away sheath through which a 41 cm 5 [place] dual lumen power injectable PICC was advanced, and positioned with its tip at the lower SVC/right atrial junction. Fluoroscopy during the procedure and fluoro spot radiograph confirms appropriate catheter position. The catheter was flushed, secured to the skin, and covered with a sterile dressing. IMPRESSION: Successful placement of a left arm PICC with sonographic and fluoroscopic guidance. The catheter is ready for use. Electronically Signed   By: Markus Daft M.D.   On: 03/24/2022 18:07   DG Chest 1 View  Result Date: 03/23/2022 CLINICAL DATA:  Status post thoracentesis. EXAM: CHEST  1 VIEW COMPARISON:  CT 03/22/2022 FINDINGS: Stable cardiomediastinal contours. Interval decreased volume of the right pleural effusion status post thoracentesis. No pneumothorax identified. Persistence of atelectasis within the right lower lung. Left lung clear. IMPRESSION: Decrease in right pleural effusion status post thoracentesis. No pneumothorax. Electronically Signed   By: Kerby Moors M.D.   On: 03/23/2022  12:30   CT ABDOMEN PELVIS W CONTRAST  Result Date: 03/22/2022 CLINICAL DATA:  Chest mass, metastatic evaluation * Tracking Code: BO * EXAM: CT ABDOMEN AND PELVIS WITH CONTRAST TECHNIQUE: Multidetector CT imaging of the abdomen and pelvis was performed using the standard protocol following bolus administration of intravenous contrast. RADIATION DOSE REDUCTION: This exam was performed according to the departmental dose-optimization program which includes automated exposure control, adjustment of the mA and/or kV according to patient size and/or use of iterative reconstruction technique. CONTRAST:  135m OMNIPAQUE IOHEXOL 350 MG/ML SOLN COMPARISON:  None Available. FINDINGS: Lower chest: Please see separately reported examination of the chest. Hepatobiliary: Multiple  low-attenuation lesions scattered throughout the liver, the largest of which appear to be fluid attenuation simple cysts or hemangiomata, others too small to characterize. No gallstones, gallbladder wall thickening, or biliary dilatation. Pancreas: Unremarkable. No pancreatic ductal dilatation or surrounding inflammatory changes. Spleen: Normal in size without significant abnormality. Adrenals/Urinary Tract: Adrenal glands are unremarkable. Kidneys are normal, without renal calculi, solid lesion, or hydronephrosis. Bladder is unremarkable. Stomach/Bowel: Stomach is within normal limits. Appendix appears normal. No evidence of bowel wall thickening, distention, or inflammatory changes. Vascular/Lymphatic: Superficial venous collateralization about the ventral abdomen, likely secondary to central venous obstruction in the chest. No enlarged abdominal or pelvic lymph nodes. Reproductive: No mass or other significant abnormality. Other: No abdominal wall hernia or abnormality.  Anasarca. Musculoskeletal: Lytic lesion of left aspect of the L4 vertebral body and left pedicle. IMPRESSION: 1. Multiple low-attenuation lesions scattered throughout the liver, the largest of which appear to be fluid attenuation simple cysts or hemangiomata, others too small to characterize. Hepatic metastases not excluded. 2. Lytic lesion of left aspect of the L4 vertebral body and left pedicle, consistent with osseous metastatic disease. 3. Superficial venous collateralization about the ventral abdomen, likely secondary to central venous obstruction in the chest. 4. Anasarca. Electronically Signed   By: ADelanna AhmadiM.D.   On: 03/22/2022 20:04   CT Angio Chest PE W and/or Wo Contrast  Result Date: 03/22/2022 CLINICAL DATA:  PE suspected, metastatic disease, likely breast * Tracking Code: BO * EXAM: CT ANGIOGRAPHY CHEST WITH CONTRAST TECHNIQUE: Multidetector CT imaging of the chest was performed using the standard protocol during bolus  administration of intravenous contrast. Multiplanar CT image reconstructions and MIPs were obtained to evaluate the vascular anatomy. RADIATION DOSE REDUCTION: This exam was performed according to the departmental dose-optimization program which includes automated exposure control, adjustment of the mA and/or kV according to patient size and/or use of iterative reconstruction technique. CONTRAST:  1314mOMNIPAQUE IOHEXOL 350 MG/ML SOLN COMPARISON:  None Available. FINDINGS: Cardiovascular: Examination for pulmonary embolism by breath motion artifact throughout and atelectasis of the right lung; within this limitation, no evidence pulmonary embolism through the proximal segmental pulmonary arterial level. Normal heart size. No pericardial effusion. Mediastinum/Nodes: No enlarged mediastinal, hilar, or axillary lymph nodes. Thyroid gland, trachea, and esophagus demonstrate no significant findings. Lungs/Pleura: Large right pleural effusion associated atelectasis or consolidation. Upper Abdomen: Please see separately reported examination of the abdomen and pelvis. Musculoskeletal: Bulky, confluent, ulcerated mass of the right breast, with very extensive skin thickening nodularity extending across essentially the entire right chest wall and into the underlying musculature, as well as contralaterally to involve the left breast with severe skin thickening (series 4, image 69). Lytic osseous lesions, for example of the right fifth rib (series 4, image 51) and sternal body (series 7, image 83). Lytic pathologic fracture deformity of the T3 vertebral body (  series 7, image 83). Review of the MIP images confirms the above findings. IMPRESSION: 1. Examination for pulmonary embolism limited by breath motion artifact throughout and atelectasis of the right lung; within this limitation, no evidence of pulmonary embolism through the proximal segmental pulmonary arterial level. 2. Bulky, confluent, ulcerated mass of the right  breast, with very extensive skin thickening nodularity extending across essentially the entire right chest wall and into the underlying musculature, as well as contralaterally to involve the left breast with severe skin thickening. 3. Lytic osseous lesions, for example of the right fifth rib and sternal body. Lytic pathologic fracture deformity of the T3 vertebral body. 4. Large right pleural effusion and associated atelectasis or consolidation, presumed malignant however without directly visualized nodularity or pleural thickening. 5. Findings are consistent with advanced primary breast malignancy and associated metastatic disease. Electronically Signed   By: Delanna Ahmadi M.D.   On: 03/22/2022 19:58   CT Head Wo Contrast  Result Date: 03/22/2022 CLINICAL DATA:  Evaluate for brain metastases. EXAM: CT HEAD WITHOUT CONTRAST TECHNIQUE: Contiguous axial images were obtained from the base of the skull through the vertex without intravenous contrast. RADIATION DOSE REDUCTION: This exam was performed according to the departmental dose-optimization program which includes automated exposure control, adjustment of the mA and/or kV according to patient size and/or use of iterative reconstruction technique. COMPARISON:  None Available. FINDINGS: Brain: No evidence of acute infarction, hemorrhage, hydrocephalus, extra-axial collection or mass lesion/mass effect. Vascular: No hyperdense vessel or unexpected calcification. Skull: Normal. Negative for fracture or focal lesion. Sinuses/Orbits: No acute finding. Other: None. IMPRESSION: 1. No acute intracranial abnormalities.  Normal brain. Electronically Signed   By: Kerby Moors M.D.   On: 03/22/2022 19:39

## 2022-03-31 NOTE — Assessment & Plan Note (Signed)
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

## 2022-03-31 NOTE — Assessment & Plan Note (Signed)
I plan to repeat chest x-ray for evaluation and rule out the need for repeat thoracentesis

## 2022-03-31 NOTE — Telephone Encounter (Signed)
Scheduled appointment per 7/3 los. Contacted the infusion room at [phone] and asked for the nurse to print out an updated calendar for the patient. The nurse stated yes.

## 2022-03-31 NOTE — Progress Notes (Signed)
Patient presents for PICC removal. Order for removal signed and released. Name and date of birth confirmed. Dressing removed and PICC removed. Tip intact. 41cm length removed. Patient remained supine for 30 minutes following removal. Stable condition upon discharge.

## 2022-03-31 NOTE — Patient Instructions (Signed)
PICC Removal, Adult, Care After The following information offers guidance on how to care for yourself after your procedure. Your health care provider may also give you more specific instructions. If you have problems or questions, contact your health care provider. What can I expect after the procedure? After the procedure, it is common to have: Tenderness or soreness. Redness, swelling, or a scab at the place where your PICC was removed (exit site). Follow these instructions at home: For the first 24 hours after the procedure: Keep the bandage (dressing) on your exit site clean and dry. Do not remove your dressing until your health care provider tells you to do so. Do not lift anything heavy or do activities that require great effort until your health care provider says it is okay. You should avoid: Lifting weights. Doing yard work. Doing any physical activity with repetitive arm movement. Watch closely for any signs of an air bubble in the vein (air embolism). This is a rare but serious complication. Signs of an air embolism include trouble breathing, wheezing, chest pain, or a fast pulse. If you have signs of an air embolism, call 911 right away and lie down on your left side to keep the air from moving into your lungs. After 24 hours have passed:  Remove your dressing as told by your health care provider. Wash your hands with soap and water for at least 20 seconds before and after you change your dressing. If soap and water are not available, use hand sanitizer. Return to your normal activities as told by your health care provider. A small scab may develop over the exit site. Do not pick at the scab. When bathing or showering, gently wash the exit site with soap and water. Pat it dry. Watch for signs of infection, such as: A fever or chills. Swollen glands under your arm. More redness, swelling, or soreness around your arm. Blood, fluid, or pus coming from your exit site. Warmth or a  bad smell coming from your exit site. A red streak spreading away from your exit site. General instructions Take over-the-counter and prescription medicines only as told by your health care provider. Do not take any new medicines without checking with your health care provider first. If you were given an antibiotic ointment, apply it as told by your health care provider. Keep all follow-up visits. This is important. Contact a health care provider if: You have a fever or chills. You have swelling at your exit site or swollen glands under your arm. You have signs of infection at your exit site. You have soreness, redness, or swelling in your arm that gets worse. Get help right away if: You have numbness or tingling in your fingers, hand, or arm. Your arm looks blue and feels cold. You have signs of an air embolism, such as trouble breathing, wheezing, chest pain, or a fast pulse. These symptoms may be an emergency. Get medical help right away. Call 911. Do not wait to see if the symptoms will go away. Do not drive yourself to the hospital. Summary After a PICC is removed, it is common to have tenderness or soreness, redness, swelling, or a scab at the exit site. Keep the bandage (dressing) over the exit site clean and dry. Do not remove the dressing until your health care provider tells you to do so. Do not lift anything heavy or do activities that require great effort until your health care provider says it is okay. Watch closely for any signs   of an air bubble (air embolism). If you have signs of an air embolism, call 911 right away and lie down on your left side. This information is not intended to replace advice given to you by your health care provider. Make sure you discuss any questions you have with your health care provider. Document Revised: 04/03/2021 Document Reviewed: 04/03/2021 Elsevier Patient Education  2023 [employer]vier Inc.  

## 2022-03-31 NOTE — Assessment & Plan Note (Signed)
We will continue pain management I recommend dental clearance before we proceed with Zometa

## 2022-03-31 NOTE — Assessment & Plan Note (Signed)
Her blood count is stable She does not need transfusion support 

## 2022-03-31 NOTE — Assessment & Plan Note (Signed)
We discussed importance of frequent small meals I will schedule dietitian follow-up

## 2022-03-31 NOTE — Telephone Encounter (Signed)
Returned call the Mom. Spoke with sister and Mom. Ask is they could come in earlier to today for PICC dressing/ labs. The earliest that they can come in is 2 pm today, appr moved up.  They are paying someone to flush PICC daily and have been having issues. Mom and sister thought that Kenya was getting chemo today. Reviewed today's appts. They both said that the nurse in the hospital said that Devan would get chemo today. They are aware of today's appt and that Mistie would not get chemo today. Referral placed to social worker to assist with resources per family request.

## 2022-04-02 ENCOUNTER — Encounter: Payer: Self-pay | Admitting: Hematology and Oncology

## 2022-04-02 ENCOUNTER — Other Ambulatory Visit (HOSPITAL_COMMUNITY): Payer: Self-pay

## 2022-04-02 ENCOUNTER — Telehealth: Payer: Self-pay

## 2022-04-02 DIAGNOSIS — Z659 Problem related to unspecified psychosocial circumstances: Secondary | ICD-10-CM | POA: Insufficient documentation

## 2022-04-02 NOTE — Assessment & Plan Note (Signed)
She has no consistent help from family and unable to maintain PICC line patency I recommend PICC line removal

## 2022-04-02 NOTE — Telephone Encounter (Signed)
CSW phoned patient.  Her vm was full and a message could not be left.  Per Dr. Alvy Bimler and patients verbal consent, CSW could contact patient's mother.  She was unable to engage in conversation and requested a call back tomorrow, 04/03/22, at 9:30am.  CSW to follow up to assess needs.  CSW provided contact information.

## 2022-04-02 NOTE — Assessment & Plan Note (Signed)
We have extensive discussion about regular laxatives

## 2022-04-02 NOTE — Assessment & Plan Note (Signed)
She has poor social circumstances We will consult social worker for resources I am concerned about risks of noncompliance Her sister and mother are supportive

## 2022-04-02 NOTE — Assessment & Plan Note (Signed)
We had extensive discussion about pain management I plan to increase the dose of dilaudid and reassess next week I warn her about sedation and discussed narcotic refill policy

## 2022-04-03 ENCOUNTER — Inpatient Hospital Stay: Payer: BC Managed Care – PPO

## 2022-04-03 NOTE — Progress Notes (Signed)
[place] Work  Clinical Social Work was referred by medical provider for assessment of psychosocial needs.  Clinical Social Worker contacted caregiver by phone  to offer support and assess for needs.    CSW contacted patient's mother and sister, Neoma Laming.  Per family, patient is currenlty living in a townhouse with fourteen steps.  They are concerned about patient's ability to ambulate.  Patient previously drove herself, but now depends on her mother for transportation.  Her mother also brings her food.  CSW attempted to call patient, but her vm was full and a message could not be left.  CSW asked patient's mother to inform patient of her full vm and to please allow the ability to leave messages for future calls regarding resources.  Explained to family that patient would have to agree to any interventions before they could be initiated.  They said they understood and will have patient meet with CSW on Tuesday, 7/11, at 1:30.  Provided CSW contact information.   Margaree Mackintosh, LCSW  Clinical Social Worker [facility]

## 2022-04-07 LAB — CYTOLOGY - NON PAP

## 2022-04-08 ENCOUNTER — Encounter: Payer: Self-pay | Admitting: Primary Care

## 2022-04-08 ENCOUNTER — Inpatient Hospital Stay: Payer: BC Managed Care – PPO | Admitting: Licensed Clinical Social Worker

## 2022-04-08 ENCOUNTER — Telehealth: Payer: Self-pay

## 2022-04-08 ENCOUNTER — Ambulatory Visit (INDEPENDENT_AMBULATORY_CARE_PROVIDER_SITE_OTHER): Payer: BC Managed Care – PPO

## 2022-04-08 ENCOUNTER — Telehealth: Payer: Self-pay | Admitting: Pharmacy Technician

## 2022-04-08 ENCOUNTER — Other Ambulatory Visit: Payer: Self-pay

## 2022-04-08 ENCOUNTER — Telehealth: Payer: Self-pay | Admitting: Primary Care

## 2022-04-08 ENCOUNTER — Inpatient Hospital Stay: Payer: BC Managed Care – PPO | Admitting: Hematology and Oncology

## 2022-04-08 ENCOUNTER — Inpatient Hospital Stay: Payer: BC Managed Care – PPO

## 2022-04-08 ENCOUNTER — Other Ambulatory Visit (HOSPITAL_COMMUNITY): Payer: Self-pay

## 2022-04-08 ENCOUNTER — Ambulatory Visit (INDEPENDENT_AMBULATORY_CARE_PROVIDER_SITE_OTHER): Payer: BC Managed Care – PPO | Admitting: Primary Care

## 2022-04-08 VITALS — BP 110/70 | HR 116 | Temp 97.5°F | Ht 66.5 in | Wt 143.2 lb

## 2022-04-08 DIAGNOSIS — C50911 Malignant neoplasm of unspecified site of right female breast: Secondary | ICD-10-CM | POA: Diagnosis not present

## 2022-04-08 DIAGNOSIS — L0390 Cellulitis, unspecified: Secondary | ICD-10-CM

## 2022-04-08 DIAGNOSIS — C7951 Secondary malignant neoplasm of bone: Secondary | ICD-10-CM

## 2022-04-08 DIAGNOSIS — G893 Neoplasm related pain (acute) (chronic): Secondary | ICD-10-CM | POA: Diagnosis not present

## 2022-04-08 DIAGNOSIS — J90 Pleural effusion, not elsewhere classified: Secondary | ICD-10-CM

## 2022-04-08 MED ORDER — ABEMACICLIB 100 MG PO TABS
100.0000 mg | ORAL_TABLET | Freq: Two times a day (BID) | ORAL | 11 refills | Status: DC
  Filled 2022-04-08 – 2022-04-11 (×2): qty 56, 28d supply, fill #0
  Filled 2022-05-02: qty 56, 28d supply, fill #1
  Filled 2022-05-28: qty 56, 28d supply, fill #2
  Filled 2022-06-27: qty 56, 28d supply, fill #3
  Filled 2022-07-25: qty 56, 28d supply, fill #4

## 2022-04-08 MED ORDER — ANASTROZOLE 1 MG PO TABS
1.0000 mg | ORAL_TABLET | Freq: Every day | ORAL | 11 refills | Status: DC
  Filled 2022-04-08: qty 30, 30d supply, fill #0
  Filled 2022-05-02: qty 30, 30d supply, fill #1
  Filled 2022-06-06: qty 30, 30d supply, fill #2
  Filled 2022-07-14: qty 30, 30d supply, fill #3
  Filled 2022-08-04: qty 30, 30d supply, fill #4
  Filled 2022-10-30 – 2022-12-18 (×3): qty 30, 30d supply, fill #5

## 2022-04-08 NOTE — Telephone Encounter (Signed)
Called and spoke with pt letting her know the results of cxr and recs per BW and she verbalized understanding.  Order placed for IR thoracentesis. Nothing further needed.

## 2022-04-08 NOTE — Patient Instructions (Addendum)
Recommendations: - We will check a chest x-ray today to reevaluate right side pleural effusion, if fluid has reaccumulated we will set you up for outpatient thoracentesis sometime this week.  We will continue to monitor for reaccumulation, potentially may need to discuss Pleurx drain in the future - Start Flonase over-the-counter nasal spray-1 puff per nostril daily - Recommend getting a pulse oximeter at local drug store to monitor oxygen levels at home (O2 should be greater than 88-90% at all times, if below that level please notify our office or present to ED) - Recommend elevating right arm s much as possible with pillows and getting ace wrap for right arm swelling - Recommend getting compression stockings for lower extremities to help with swelling  - Continue to follow with oncology as directed   Orders: - CXR today re: pleural effusion  Follow-up: - 2-3 weeks with either Dr. Chase Caller or Eustaquio Maize NP with repeat CXR prior

## 2022-04-08 NOTE — Assessment & Plan Note (Signed)
I have reviewed documentation from pulmonologist Thoracentesis has been arranged

## 2022-04-08 NOTE — Assessment & Plan Note (Signed)
-   Following with Dr. Alvy Bimler with oncology, plans on starting chemotherapy

## 2022-04-08 NOTE — Telephone Encounter (Signed)
Oral Oncology Pharmacist Encounter  Received new prescription for abemaciclib (Verzenio) for the treatment of ER positive, HER2 negative breast cancer in conjunction with anastrozole, planned duration until disease progression or unacceptable toxicity.  Labs from 03/31/22 assessed, no interventions needed.  Current medication list in Epic reviewed, DDIs with Verzenio identified: none  Evaluated chart and no patient barriers to medication adherence noted.   Patient agreement for treatment documented in MD note on 04/08/22.  Prescription has been e-scribed to the [facility] for benefits analysis and approval.  Oral Oncology Clinic will continue to follow for insurance authorization, copayment issues, initial counseling and start date.  Drema Halon, PharmD Hematology/Oncology Clinical Pharmacist [facility] [phone] 04/08/2022 4:14 PM

## 2022-04-08 NOTE — Telephone Encounter (Signed)
[name] please let patient know CXR showed increased, now large right pleural effusion. Overlying right lung atelectasis and/or consolidation.   She needs right sided thoracentesis set up through IR this week.  Patient is not on a blood thinner. No cytology or cultures needed.  Cc: [facility] Procedure pool

## 2022-04-08 NOTE — Assessment & Plan Note (Signed)
I reviewed her final pathology and gave her a copy of that Her2/neu testing came back negative We reviewed the guidelines I recommend combination of aromatase inhibitor plus abemaciclib combination treatment We discussed the risk, benefits, side effects of treatment and she is in agreement to proceed Due to her malnourished status I will start her on lower dose of abemaciclib She can start aromatase inhibitor today I will see her next week for further follow-up

## 2022-04-08 NOTE — Assessment & Plan Note (Signed)
We will continue pain management I recommend dental clearance before we proceed with Zometa

## 2022-04-08 NOTE — Assessment & Plan Note (Signed)
Her pain is well controlled We discussed narcotic refill policy 

## 2022-04-08 NOTE — Assessment & Plan Note (Addendum)
-   Completed course doxycycline x 2 weeks - Daily dressing changes right breast

## 2022-04-08 NOTE — Progress Notes (Signed)
_0  ID: [name], female    DOB: [birth year], 61 y.o.   MRN: [id number]  Chief Complaint  Patient presents with   Hospitalization Follow-up    She is still having some shortness of breath with exertion, and she had a pleural effusion.    Referring provider: No ref. provider found  HPI: 61 year old female.  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  04/08/2022 Patient presents today for hospital follow-up.  Patient was admitted on 6//23 for neglected fungating metastatic breast tumor, malignant pleural effusion right side.  He was treated with vancomycin.  She was seen by wound care.  Localized infection.  Antibiotic therapy was changed to doxycycline 100 mg twice daily for 2 weeks. She underwent right sided thoracentesis on 03/23/22 with 1.8 L drained. Cytology showed malignant cells consistent with adenocarcinoma. She had skin nodule biopsy right chest wall. 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  HER2 positive testing is still pending. Oncology recommended systemic chemotherapy.  Patient no longer has PICC line. She is doing dressing changes as home daily, nursing to come out to her home. Pain control with Hydromorphone (dilaudid) and Tylenol. Patient saw Dr. Alvy Bimler with oncology on 03/31/2022. She continues to have shortness of breath, worse last 1-2 days. O2 96% RA. Ordered for chest x-ray to reevaluate pleural effusion today, may need repeat thoracentesis and potentially Pleurx cath placement.   No Known Allergies   Immunization History  Administered Date(s) Administered   Influenza,inj,Quad PF,6+ Mos 07/28/2018, 07/31/2019   PNEUMOCOCCAL CONJUGATE-20 03/26/2022    Past Medical History:  Diagnosis Date   Hypertension     Tobacco History: Social History   Tobacco Use  Smoking Status Never  Smokeless Tobacco Never   Counseling given: Not  Answered   Outpatient Medications Prior to Visit  Medication Sig Dispense Refill   calcium carbonate (TUMS - DOSED IN MG ELEMENTAL CALCIUM) 500 MG chewable tablet Chew 1 tablet by mouth 2 (two) times daily.     cholecalciferol (VITAMIN D3) 25 MCG (1000 UNIT) tablet Take 2,000 Units by mouth daily.     doxycycline (VIBRAMYCIN) 100 MG capsule Take 1 capsule (100 mg total) by mouth 2 (two) times daily for 14 days. 28 capsule 0   HYDROmorphone (DILAUDID) 8 MG tablet Take 1 tablet (8 mg total) by mouth every 4 (four) hours as needed for moderate pain. 60 tablet 0   megestrol (MEGACE) 40 MG tablet Take 1 tablet (40 mg total) by mouth 2 (two) times daily. 60 tablet 0   melatonin 3 MG TABS tablet Take 1 tablet (3 mg total) by mouth at bedtime as needed.  0   metroNIDAZOLE (METROGEL) 1 % gel Apply topically daily. 60 g 0   polyethylene glycol (MIRALAX / GLYCOLAX) 17 g packet Take 17 g by mouth daily.     senna (SENOKOT) 8.6 MG tablet Take 2 tablets by mouth 2 (two) times daily.     Sodium Chloride Flush (NORMAL SALINE FLUSH) 0.9 % SOLN Please flush 10 ml to each lumen daily. 300 mL 1   No facility-administered medications prior to visit.   Review of Systems  Review of Systems  Constitutional: Negative.   HENT:  Positive for ear pain.   Respiratory:  Positive for shortness of breath. Negative for cough, chest tightness and wheezing.   Cardiovascular: Negative.     Physical Exam  BP 110/70 (BP Location: Left Arm, Cuff Size: Normal)  Pulse (!) 116   Temp (!) 97.5 F (36.4 C) (Oral)   Ht 5' 6.5" (1.689 m)   Wt 143 lb 3.2 oz (65 kg)   SpO2 96%   BMI 22.77 kg/m  Physical Exam Constitutional:      General: She is not in acute distress.    Appearance: Normal appearance.     Comments: Thin adult female, chronically ill appearing  HENT:     Head: Normocephalic and atraumatic.     Right Ear: Tympanic membrane normal. There is no impacted cerumen.     Left Ear: Tympanic membrane normal.  There is impacted cerumen.     Mouth/Throat:     Mouth: Mucous membranes are moist.     Pharynx: Oropharynx is clear.  Cardiovascular:     Rate and Rhythm: Normal rate and regular rhythm.  Pulmonary:     Effort: Pulmonary effort is normal. No respiratory distress.     Breath sounds: No stridor. No wheezing, rhonchi or rales.     Comments: Diminished right base. No respiratory distress. O2 96% RA.  Musculoskeletal:        General: Swelling present.     Comments: In WC; +3 RUE swelling   Skin:    General: Skin is warm and dry.  Neurological:     General: No focal deficit present.     Mental Status: She is alert and oriented to person, place, and time. Mental status is at baseline.  Psychiatric:        Mood and Affect: Mood normal.        Behavior: Behavior normal.        Thought Content: Thought content normal.        Judgment: Judgment normal.      Lab Results:  CBC    Component Value Date/Time   WBC 9.1 03/31/2022 1407   RBC 4.07 03/31/2022 1407   HGB 9.6 (L) 03/31/2022 1407   HCT 29.7 (L) 03/31/2022 1407   PLT 454 (H) 03/31/2022 1407   MCV 73.0 (L) 03/31/2022 1407   MCH 23.6 (L) 03/31/2022 1407   MCHC 32.3 03/31/2022 1407   RDW 20.5 (H) 03/31/2022 1407   LYMPHSABS 1.3 03/31/2022 1407   MONOABS 0.5 03/31/2022 1407   EOSABS 0.0 03/31/2022 1407   BASOSABS 0.1 03/31/2022 1407    BMET    Component Value Date/Time   NA 138 03/31/2022 1407   K 4.2 03/31/2022 1407   CL 109 03/31/2022 1407   CO2 21 (L) 03/31/2022 1407   GLUCOSE 103 (H) 03/31/2022 1407   BUN 24 (H) 03/31/2022 1407   CREATININE 1.15 (H) 03/31/2022 1407   CALCIUM 8.2 (L) 03/31/2022 1407   GFRNONAA 54 (L) 03/31/2022 1407    BNP    Component Value Date/Time   BNP 34.4 03/22/2022 1740    ProBNP No results found for: "PROBNP"  Imaging: IR US Guide Bx Asp/Drain  Result Date: 03/24/2022 INDICATION: 61 year old with probable metastatic breast cancer and tissue diagnosis is needed. Patient has a  palpable anterior chest wall mass. EXAM: ULTRASOUND-GUIDED CORE BIOPSY OF RIGHT ANTERIOR CHEST WALL MASS MEDICATIONS: 1% lidocaine, local anesthetic ANESTHESIA/SEDATION: None FLUOROSCOPY TIME:  None COMPLICATIONS: None immediate. PROCEDURE: Informed written consent was obtained from the patient after a thorough discussion of the procedural risks, benefits and alternatives. All questions were addressed. A timeout was performed prior to the initiation of the procedure. There is a large soft tissue mass along the medial and anterior upper chest wall. Patient has a  dressing over the ulcerative right breast mass. The right anterior chest wall mass was prepped with chlorhexidine and sterile field was created. Skin was anesthetized using 1% lidocaine. A small incision was made. Using ultrasound guidance, an 18 gauge core device was directed into the lesion. Several core biopsies were obtained. Adequate amount of tissue was obtained. Specimens placed in formalin. Bandage placed over the puncture site. FINDINGS: Slightly heterogeneous superficial mass in the right anterior chest wall. Needle position confirmed within the lesion. IMPRESSION: Ultrasound-guided core biopsy of the right anterior chest wall mass. Electronically Signed   By: Markus Daft M.D.   On: 03/24/2022 18:11   IR PICC PLACEMENT LEFT >5 YRS INC IMG GUIDE  Result Date: 03/24/2022 INDICATION: 61 year old with an ulcerative right breast mass and evidence of metastatic disease. Patient needs a central venous catheter for treatment. Left arm PICC line was selected rather than a Port-A-Cath due to the disease in left breast. EXAM: LEFT ARM PICC LINE PLACEMENT WITH ULTRASOUND AND FLUOROSCOPIC GUIDANCE MEDICATIONS: 1% lidocaine, moderate sedation ANESTHESIA/SEDATION: None FLUOROSCOPY: Radiation Exposure Index (as provided by the fluoroscopic device): 1 mGy Kerma COMPLICATIONS: None immediate. PROCEDURE: The patient was advised of the possible risks and  complications and agreed to undergo the procedure. The patient was then brought to the angiographic suite for the procedure. The left arm was prepped with chlorhexidine, draped in the usual sterile fashion using maximum barrier technique (cap and mask, sterile gown, sterile gloves, large sterile sheet, hand hygiene and cutaneous antiseptic). Local anesthesia was attained by infiltration with 1% lidocaine. Ultrasound demonstrated patency of the left basilic vein, and this was documented with an image. Under real-time ultrasound guidance, this vein was accessed with a 21 gauge micropuncture needle and image documentation was performed. The needle was exchanged over a guidewire for a peel-away sheath through which a 41 cm 5 [place] dual lumen power injectable PICC was advanced, and positioned with its tip at the Burch SVC/right atrial junction. Fluoroscopy during the procedure and fluoro spot radiograph confirms appropriate catheter position. The catheter was flushed, secured to the skin, and covered with a sterile dressing. IMPRESSION: Successful placement of a left arm PICC with sonographic and fluoroscopic guidance. The catheter is ready for use. Electronically Signed   By: Markus Daft M.D.   On: 03/24/2022 18:07   DG Chest 1 View  Result Date: 03/23/2022 CLINICAL DATA:  Status post thoracentesis. EXAM: CHEST  1 VIEW COMPARISON:  CT 03/22/2022 FINDINGS: Stable cardiomediastinal contours. Interval decreased volume of the right pleural effusion status post thoracentesis. No pneumothorax identified. Persistence of atelectasis within the right Burch lung. Left lung clear. IMPRESSION: Decrease in right pleural effusion status post thoracentesis. No pneumothorax. Electronically Signed   By: Kerby Moors M.D.   On: 03/23/2022 12:30   CT ABDOMEN PELVIS W CONTRAST  Result Date: 03/22/2022 CLINICAL DATA:  Chest mass, metastatic evaluation * Tracking Code: BO * EXAM: CT ABDOMEN AND PELVIS WITH CONTRAST TECHNIQUE:  Multidetector CT imaging of the abdomen and pelvis was performed using the standard protocol following bolus administration of intravenous contrast. RADIATION DOSE REDUCTION: This exam was performed according to the departmental dose-optimization program which includes automated exposure control, adjustment of the mA and/or kV according to patient size and/or use of iterative reconstruction technique. CONTRAST:  154m OMNIPAQUE IOHEXOL 350 MG/ML SOLN COMPARISON:  None Available. FINDINGS: Burch chest: Please see separately reported examination of the chest. Hepatobiliary: Multiple low-attenuation lesions scattered throughout the liver, the largest of which appear to be fluid  attenuation simple cysts or hemangiomata, others too small to characterize. No gallstones, gallbladder wall thickening, or biliary dilatation. Pancreas: Unremarkable. No pancreatic ductal dilatation or surrounding inflammatory changes. Spleen: Normal in size without significant abnormality. Adrenals/Urinary Tract: Adrenal glands are unremarkable. Kidneys are normal, without renal calculi, solid lesion, or hydronephrosis. Bladder is unremarkable. Stomach/Bowel: Stomach is within normal limits. Appendix appears normal. No evidence of bowel wall thickening, distention, or inflammatory changes. Vascular/Lymphatic: Superficial venous collateralization about the ventral abdomen, likely secondary to central venous obstruction in the chest. No enlarged abdominal or pelvic lymph nodes. Reproductive: No mass or other significant abnormality. Other: No abdominal wall hernia or abnormality.  Anasarca. Musculoskeletal: Lytic lesion of left aspect of the L4 vertebral body and left pedicle. IMPRESSION: 1. Multiple low-attenuation lesions scattered throughout the liver, the largest of which appear to be fluid attenuation simple cysts or hemangiomata, others too small to characterize. Hepatic metastases not excluded. 2. Lytic lesion of left aspect of the L4  vertebral body and left pedicle, consistent with osseous metastatic disease. 3. Superficial venous collateralization about the ventral abdomen, likely secondary to central venous obstruction in the chest. 4. Anasarca. Electronically Signed   By: Delanna Ahmadi M.D.   On: 03/22/2022 20:04   CT Angio Chest PE W and/or Wo Contrast  Result Date: 03/22/2022 CLINICAL DATA:  PE suspected, metastatic disease, likely breast * Tracking Code: BO * EXAM: CT ANGIOGRAPHY CHEST WITH CONTRAST TECHNIQUE: Multidetector CT imaging of the chest was performed using the standard protocol during bolus administration of intravenous contrast. Multiplanar CT image reconstructions and MIPs were obtained to evaluate the vascular anatomy. RADIATION DOSE REDUCTION: This exam was performed according to the departmental dose-optimization program which includes automated exposure control, adjustment of the mA and/or kV according to patient size and/or use of iterative reconstruction technique. CONTRAST:  150m OMNIPAQUE IOHEXOL 350 MG/ML SOLN COMPARISON:  None Available. FINDINGS: Cardiovascular: Examination for pulmonary embolism by breath motion artifact throughout and atelectasis of the right lung; within this limitation, no evidence pulmonary embolism through the proximal segmental pulmonary arterial level. Normal heart size. No pericardial effusion. Mediastinum/Nodes: No enlarged mediastinal, hilar, or axillary lymph nodes. Thyroid gland, trachea, and esophagus demonstrate no significant findings. Lungs/Pleura: Large right pleural effusion associated atelectasis or consolidation. Upper Abdomen: Please see separately reported examination of the abdomen and pelvis. Musculoskeletal: Bulky, confluent, ulcerated mass of the right breast, with very extensive skin thickening nodularity extending across essentially the entire right chest wall and into the underlying musculature, as well as contralaterally to involve the left breast with severe skin  thickening (series 4, image 69). Lytic osseous lesions, for example of the right fifth rib (series 4, image 51) and sternal body (series 7, image 83). Lytic pathologic fracture deformity of the T3 vertebral body (series 7, image 83). Review of the MIP images confirms the above findings. IMPRESSION: 1. Examination for pulmonary embolism limited by breath motion artifact throughout and atelectasis of the right lung; within this limitation, no evidence of pulmonary embolism through the proximal segmental pulmonary arterial level. 2. Bulky, confluent, ulcerated mass of the right breast, with very extensive skin thickening nodularity extending across essentially the entire right chest wall and into the underlying musculature, as well as contralaterally to involve the left breast with severe skin thickening. 3. Lytic osseous lesions, for example of the right fifth rib and sternal body. Lytic pathologic fracture deformity of the T3 vertebral body. 4. Large right pleural effusion and associated atelectasis or consolidation, presumed malignant however without directly  visualized nodularity or pleural thickening. 5. Findings are consistent with advanced primary breast malignancy and associated metastatic disease. Electronically Signed   By: Delanna Ahmadi M.D.   On: 03/22/2022 19:58   CT Head Wo Contrast  Result Date: 03/22/2022 CLINICAL DATA:  Evaluate for brain metastases. EXAM: CT HEAD WITHOUT CONTRAST TECHNIQUE: Contiguous axial images were obtained from the base of the skull through the vertex without intravenous contrast. RADIATION DOSE REDUCTION: This exam was performed according to the departmental dose-optimization program which includes automated exposure control, adjustment of the mA and/or kV according to patient size and/or use of iterative reconstruction technique. COMPARISON:  None Available. FINDINGS: Brain: No evidence of acute infarction, hemorrhage, hydrocephalus, extra-axial collection or mass  lesion/mass effect. Vascular: No hyperdense vessel or unexpected calcification. Skull: Normal. Negative for fracture or focal lesion. Sinuses/Orbits: No acute finding. Other: None. IMPRESSION: 1. No acute intracranial abnormalities.  Normal brain. Electronically Signed   By: Kerby Moors M.D.   On: 03/22/2022 19:39     Assessment & Plan:   Pleural effusion - Large right-sided pleural effusion, s/p thoracentesis on 03/23/2022 - cytology showed malignant cells consistent with adenocarcinoma.  Plan to repeat chest x-ray today to monitor for fluid reaccumulation, she will likely need outpatient thoracentesis through either IR or with one of our physicians in office sometime this week. At somepoint may need pleurx cath placement for ongoing management of malignant pleural effusion. Respiratory wise she is stable today. O2 96% RA. Lung sounds were diminsiehd right lung base. No respiratory distress. If respiratory symptoms worsen advised patient to present to ED.   Breast cancer metastasized to bone, right [facility]) - Following with Dr. Alvy Bimler with oncology, plans on starting chemotherapy  Cellulitis - Completed course doxycycline x 2 weeks - Daily dressing changes right breast   Martyn Ehrich, NP 04/08/2022

## 2022-04-08 NOTE — Progress Notes (Signed)
[place] OFFICE PROGRESS NOTE  Patient Care Team: Heath Lark, MD as PCP - General (Hematology and Oncology)  ASSESSMENT & PLAN:  Primary breast malignancy [facility]) I reviewed her final pathology and gave her a copy of that Her2/neu testing came back negative We reviewed the guidelines I recommend combination of aromatase inhibitor plus abemaciclib combination treatment We discussed the risk, benefits, side effects of treatment and she is in agreement to proceed Due to her malnourished status I will start her on lower dose of abemaciclib She can start aromatase inhibitor today I will see her next week for further follow-up  Breast cancer metastasized to bone, right ([place]) We will continue pain management I recommend dental clearance before we proceed with Zometa  Pleural effusion I have reviewed documentation from pulmonologist Thoracentesis has been arranged  Cancer associated pain Her pain is well controlled We discussed narcotic refill policy  No orders of the defined types were placed in this encounter.   All questions were answered. The patient knows to call the clinic with any problems, questions or concerns. The total time spent in the appointment was 40 minutes encounter with patients including review of chart and various tests results, discussions about plan of care and coordination of care plan   Heath Lark, MD 04/08/2022 4:03 PM  INTERVAL HISTORY: Please see below for problem oriented charting. she returns for treatment follow-up with her mother She was reviewed by pulmonologist this morning and thoracentesis has been arranged Her pain is better controlled She have lost some weight but she is attempting to eat better She denies constipation We spent majority of our time today discussing treatment options and side effects of treatment  REVIEW OF SYSTEMS:   Constitutional: Denies fevers, chills  Eyes: Denies blurriness of vision Ears, nose,  mouth, throat, and face: Denies mucositis or sore throat Cardiovascular: Denies palpitation, chest discomfort or lower extremity swelling Gastrointestinal:  Denies nausea, heartburn or change in bowel habits Skin: Denies abnormal skin rashes Lymphatics: Denies new lymphadenopathy or easy bruising Neurological:Denies numbness, tingling or new weaknesses Behavioral/Psych: Mood is stable, no new changes  All other systems were reviewed with the patient and are negative.  I have reviewed the past medical history, past surgical history, social history and family history with the patient and they are unchanged from previous note.  ALLERGIES:  has No Known Allergies.  MEDICATIONS:  Current Outpatient Medications  Medication Sig Dispense Refill   anastrozole (ARIMIDEX) 1 MG tablet Take 1 tablet (1 mg total) by mouth daily. 30 tablet 11   calcium carbonate (TUMS - DOSED IN MG ELEMENTAL CALCIUM) 500 MG chewable tablet Chew 1 tablet by mouth 2 (two) times daily.     cholecalciferol (VITAMIN D3) 25 MCG (1000 UNIT) tablet Take 2,000 Units by mouth daily.     doxycycline (VIBRAMYCIN) 100 MG capsule Take 1 capsule (100 mg total) by mouth 2 (two) times daily for 14 days. 28 capsule 0   HYDROmorphone (DILAUDID) 8 MG tablet Take 1 tablet (8 mg total) by mouth every 4 (four) hours as needed for moderate pain. 60 tablet 0   megestrol (MEGACE) 40 MG tablet Take 1 tablet (40 mg total) by mouth 2 (two) times daily. 60 tablet 0   melatonin 3 MG TABS tablet Take 1 tablet (3 mg total) by mouth at bedtime as needed.  0   metroNIDAZOLE (METROGEL) 1 % gel Apply topically daily. 60 g 0   polyethylene glycol (MIRALAX / GLYCOLAX) 17 g packet Take  17 g by mouth daily.     senna (SENOKOT) 8.6 MG tablet Take 2 tablets by mouth 2 (two) times daily.     Sodium Chloride Flush (NORMAL SALINE FLUSH) 0.9 % SOLN Please flush 10 ml to each lumen daily. 300 mL 1   No current facility-administered medications for this visit.     SUMMARY OF ONCOLOGIC HISTORY: Oncology History Overview Note  The tumor cells are EQUIVOCAL For Her2 (2+).   Estrogen Receptor:       POSITIVE, 100%, STRONG STAINING  Progesterone Receptor:   NEGATIVE (0)  Proliferation Marker Ki-67:   45%    Primary breast malignancy ([place])  03/22/2022 Initial Diagnosis   Primary breast malignancy ([place])   03/24/2022 Pathology Results   SURGICAL PATHOLOGY  CASE: [phone]  PATIENT: [name]  Surgical Pathology Report   Clinical History: Ulcerative right breast lesion and probable metastatic breast cancer ([facility])   FINAL MICROSCOPIC DIAGNOSIS:   A. SOFT TISSUE MASS, RIGHT UPPER CHEST WALL, NEEDLE CORE BIOPSY:  - Invasive ductal carcinoma, see comment  - Ductal carcinoma in situ: Not identified   - Tubule formation: Score 3  - Nuclear pleomorphism: Score 3  - Mitotic count: Score 3  - Total score: 9  - Overall Grade: 3  - Lymphovascular invasion: Not identified  - Cancer Length: 13 mm  - Calcifications: Not identified  - Other findings: None    03/28/2022 Cancer Staging   Staging form: Breast, AJCC 8th Edition - Clinical stage from 03/28/2022: Stage IV (cT4d, cN3c, pM1, G3, ER+, PR-, HER2: Equivocal) - Signed by Heath Lark, MD on 03/28/2022 Stage prefix: Initial diagnosis Nuclear grade: G3 Histologic grading system: 3 grade system     PHYSICAL EXAMINATION: ECOG PERFORMANCE STATUS: 2 - Symptomatic, <50% confined to bed  Vitals:   04/08/22 1439  BP: (!) 95/54  Pulse: (!) 121  Resp: 20  Temp: 98.8 F (37.1 C)  SpO2: 98%   Filed Weights   04/08/22 1439  Weight: 143 lb 9.6 oz (65.1 kg)    GENERAL:alert, no distress and comfortable NEURO: alert & oriented x 3 with fluent speech, no focal motor/sensory deficits  LABORATORY DATA:  I have reviewed the data as listed    Component Value Date/Time   NA 138 03/31/2022 1407   K 4.2 03/31/2022 1407   CL 109 03/31/2022 1407   CO2 21 (L) 03/31/2022 1407   GLUCOSE 103  (H) 03/31/2022 1407   BUN 24 (H) 03/31/2022 1407   CREATININE 1.15 (H) 03/31/2022 1407   CALCIUM 8.2 (L) 03/31/2022 1407   PROT 6.1 (L) 03/31/2022 1407   ALBUMIN 2.6 (L) 03/31/2022 1407   AST 36 03/31/2022 1407   ALT 12 03/31/2022 1407   ALKPHOS 73 03/31/2022 1407   BILITOT 0.4 03/31/2022 1407   GFRNONAA 54 (L) 03/31/2022 1407    No results found for: "SPEP", "UPEP"  Lab Results  Component Value Date   WBC 9.1 03/31/2022   NEUTROABS 7.2 03/31/2022   HGB 9.6 (L) 03/31/2022   HCT 29.7 (L) 03/31/2022   MCV 73.0 (L) 03/31/2022   PLT 454 (H) 03/31/2022      Chemistry      Component Value Date/Time   NA 138 03/31/2022 1407   K 4.2 03/31/2022 1407   CL 109 03/31/2022 1407   CO2 21 (L) 03/31/2022 1407   BUN 24 (H) 03/31/2022 1407   CREATININE 1.15 (H) 03/31/2022 1407      Component Value Date/Time   CALCIUM 8.2 (  L) 03/31/2022 1407   ALKPHOS 73 03/31/2022 1407   AST 36 03/31/2022 1407   ALT 12 03/31/2022 1407   BILITOT 0.4 03/31/2022 1407       RADIOGRAPHIC STUDIES: I have personally reviewed the radiological images as listed and agreed with the findings in the report. DG Chest 2 View  Result Date: 04/08/2022 CLINICAL DATA:  FU right pleural effusion EXAM: CHEST - 2 VIEW COMPARISON:  Chest x-ray June 25, 23. FINDINGS: Patient rotation. Increased, now large right pleural effusion. Overlying right lung opacities. Left lung is clear. No visible pneumothorax. Visualized cardiomediastinal silhouette is similar. IMPRESSION: Increased, now large right pleural effusion. Overlying right lung atelectasis and/or consolidation. Electronically Signed   By: Margaretha Sheffield M.D.   On: 04/08/2022 13:20   IR US Guide Bx Asp/Drain  Result Date: 03/24/2022 INDICATION: 61 year old with probable metastatic breast cancer and tissue diagnosis is needed. Patient has a palpable anterior chest wall mass. EXAM: ULTRASOUND-GUIDED CORE BIOPSY OF RIGHT ANTERIOR CHEST WALL MASS MEDICATIONS: 1%  lidocaine, local anesthetic ANESTHESIA/SEDATION: None FLUOROSCOPY TIME:  None COMPLICATIONS: None immediate. PROCEDURE: Informed written consent was obtained from the patient after a thorough discussion of the procedural risks, benefits and alternatives. All questions were addressed. A timeout was performed prior to the initiation of the procedure. There is a large soft tissue mass along the medial and anterior upper chest wall. Patient has a dressing over the ulcerative right breast mass. The right anterior chest wall mass was prepped with chlorhexidine and sterile field was created. Skin was anesthetized using 1% lidocaine. A small incision was made. Using ultrasound guidance, an 18 gauge core device was directed into the lesion. Several core biopsies were obtained. Adequate amount of tissue was obtained. Specimens placed in formalin. Bandage placed over the puncture site. FINDINGS: Slightly heterogeneous superficial mass in the right anterior chest wall. Needle position confirmed within the lesion. IMPRESSION: Ultrasound-guided core biopsy of the right anterior chest wall mass. Electronically Signed   By: Markus Daft M.D.   On: 03/24/2022 18:11   IR PICC PLACEMENT LEFT >5 YRS INC IMG GUIDE  Result Date: 03/24/2022 INDICATION: 61 year old with an ulcerative right breast mass and evidence of metastatic disease. Patient needs a central venous catheter for treatment. Left arm PICC line was selected rather than a Port-A-Cath due to the disease in left breast. EXAM: LEFT ARM PICC LINE PLACEMENT WITH ULTRASOUND AND FLUOROSCOPIC GUIDANCE MEDICATIONS: 1% lidocaine, moderate sedation ANESTHESIA/SEDATION: None FLUOROSCOPY: Radiation Exposure Index (as provided by the fluoroscopic device): 1 mGy Kerma COMPLICATIONS: None immediate. PROCEDURE: The patient was advised of the possible risks and complications and agreed to undergo the procedure. The patient was then brought to the angiographic suite for the procedure. The  left arm was prepped with chlorhexidine, draped in the usual sterile fashion using maximum barrier technique (cap and mask, sterile gown, sterile gloves, large sterile sheet, hand hygiene and cutaneous antiseptic). Local anesthesia was attained by infiltration with 1% lidocaine. Ultrasound demonstrated patency of the left basilic vein, and this was documented with an image. Under real-time ultrasound guidance, this vein was accessed with a 21 gauge micropuncture needle and image documentation was performed. The needle was exchanged over a guidewire for a peel-away sheath through which a 41 cm 5 [place] dual lumen power injectable PICC was advanced, and positioned with its tip at the lower SVC/right atrial junction. Fluoroscopy during the procedure and fluoro spot radiograph confirms appropriate catheter position. The catheter was flushed, secured to the skin, and covered with  a sterile dressing. IMPRESSION: Successful placement of a left arm PICC with sonographic and fluoroscopic guidance. The catheter is ready for use. Electronically Signed   By: Markus Daft M.D.   On: 03/24/2022 18:07   DG Chest 1 View  Result Date: 03/23/2022 CLINICAL DATA:  Status post thoracentesis. EXAM: CHEST  1 VIEW COMPARISON:  CT 03/22/2022 FINDINGS: Stable cardiomediastinal contours. Interval decreased volume of the right pleural effusion status post thoracentesis. No pneumothorax identified. Persistence of atelectasis within the right lower lung. Left lung clear. IMPRESSION: Decrease in right pleural effusion status post thoracentesis. No pneumothorax. Electronically Signed   By: Kerby Moors M.D.   On: 03/23/2022 12:30   CT ABDOMEN PELVIS W CONTRAST  Result Date: 03/22/2022 CLINICAL DATA:  Chest mass, metastatic evaluation * Tracking Code: BO * EXAM: CT ABDOMEN AND PELVIS WITH CONTRAST TECHNIQUE: Multidetector CT imaging of the abdomen and pelvis was performed using the standard protocol following bolus administration of  intravenous contrast. RADIATION DOSE REDUCTION: This exam was performed according to the departmental dose-optimization program which includes automated exposure control, adjustment of the mA and/or kV according to patient size and/or use of iterative reconstruction technique. CONTRAST:  131m OMNIPAQUE IOHEXOL 350 MG/ML SOLN COMPARISON:  None Available. FINDINGS: Lower chest: Please see separately reported examination of the chest. Hepatobiliary: Multiple low-attenuation lesions scattered throughout the liver, the largest of which appear to be fluid attenuation simple cysts or hemangiomata, others too small to characterize. No gallstones, gallbladder wall thickening, or biliary dilatation. Pancreas: Unremarkable. No pancreatic ductal dilatation or surrounding inflammatory changes. Spleen: Normal in size without significant abnormality. Adrenals/Urinary Tract: Adrenal glands are unremarkable. Kidneys are normal, without renal calculi, solid lesion, or hydronephrosis. Bladder is unremarkable. Stomach/Bowel: Stomach is within normal limits. Appendix appears normal. No evidence of bowel wall thickening, distention, or inflammatory changes. Vascular/Lymphatic: Superficial venous collateralization about the ventral abdomen, likely secondary to central venous obstruction in the chest. No enlarged abdominal or pelvic lymph nodes. Reproductive: No mass or other significant abnormality. Other: No abdominal wall hernia or abnormality.  Anasarca. Musculoskeletal: Lytic lesion of left aspect of the L4 vertebral body and left pedicle. IMPRESSION: 1. Multiple low-attenuation lesions scattered throughout the liver, the largest of which appear to be fluid attenuation simple cysts or hemangiomata, others too small to characterize. Hepatic metastases not excluded. 2. Lytic lesion of left aspect of the L4 vertebral body and left pedicle, consistent with osseous metastatic disease. 3. Superficial venous collateralization about the  ventral abdomen, likely secondary to central venous obstruction in the chest. 4. Anasarca. Electronically Signed   By: ADelanna AhmadiM.D.   On: 03/22/2022 20:04   CT Angio Chest PE W and/or Wo Contrast  Result Date: 03/22/2022 CLINICAL DATA:  PE suspected, metastatic disease, likely breast * Tracking Code: BO * EXAM: CT ANGIOGRAPHY CHEST WITH CONTRAST TECHNIQUE: Multidetector CT imaging of the chest was performed using the standard protocol during bolus administration of intravenous contrast. Multiplanar CT image reconstructions and MIPs were obtained to evaluate the vascular anatomy. RADIATION DOSE REDUCTION: This exam was performed according to the departmental dose-optimization program which includes automated exposure control, adjustment of the mA and/or kV according to patient size and/or use of iterative reconstruction technique. CONTRAST:  1355mOMNIPAQUE IOHEXOL 350 MG/ML SOLN COMPARISON:  None Available. FINDINGS: Cardiovascular: Examination for pulmonary embolism by breath motion artifact throughout and atelectasis of the right lung; within this limitation, no evidence pulmonary embolism through the proximal segmental pulmonary arterial level. Normal heart size. No pericardial effusion.  Mediastinum/Nodes: No enlarged mediastinal, hilar, or axillary lymph nodes. Thyroid gland, trachea, and esophagus demonstrate no significant findings. Lungs/Pleura: Large right pleural effusion associated atelectasis or consolidation. Upper Abdomen: Please see separately reported examination of the abdomen and pelvis. Musculoskeletal: Bulky, confluent, ulcerated mass of the right breast, with very extensive skin thickening nodularity extending across essentially the entire right chest wall and into the underlying musculature, as well as contralaterally to involve the left breast with severe skin thickening (series 4, image 69). Lytic osseous lesions, for example of the right fifth rib (series 4, image 51) and sternal  body (series 7, image 83). Lytic pathologic fracture deformity of the T3 vertebral body (series 7, image 83). Review of the MIP images confirms the above findings. IMPRESSION: 1. Examination for pulmonary embolism limited by breath motion artifact throughout and atelectasis of the right lung; within this limitation, no evidence of pulmonary embolism through the proximal segmental pulmonary arterial level. 2. Bulky, confluent, ulcerated mass of the right breast, with very extensive skin thickening nodularity extending across essentially the entire right chest wall and into the underlying musculature, as well as contralaterally to involve the left breast with severe skin thickening. 3. Lytic osseous lesions, for example of the right fifth rib and sternal body. Lytic pathologic fracture deformity of the T3 vertebral body. 4. Large right pleural effusion and associated atelectasis or consolidation, presumed malignant however without directly visualized nodularity or pleural thickening. 5. Findings are consistent with advanced primary breast malignancy and associated metastatic disease. Electronically Signed   By: Delanna Ahmadi M.D.   On: 03/22/2022 19:58   CT Head Wo Contrast  Result Date: 03/22/2022 CLINICAL DATA:  Evaluate for brain metastases. EXAM: CT HEAD WITHOUT CONTRAST TECHNIQUE: Contiguous axial images were obtained from the base of the skull through the vertex without intravenous contrast. RADIATION DOSE REDUCTION: This exam was performed according to the departmental dose-optimization program which includes automated exposure control, adjustment of the mA and/or kV according to patient size and/or use of iterative reconstruction technique. COMPARISON:  None Available. FINDINGS: Brain: No evidence of acute infarction, hemorrhage, hydrocephalus, extra-axial collection or mass lesion/mass effect. Vascular: No hyperdense vessel or unexpected calcification. Skull: Normal. Negative for fracture or focal lesion.  Sinuses/Orbits: No acute finding. Other: None. IMPRESSION: 1. No acute intracranial abnormalities.  Normal brain. Electronically Signed   By: Kerby Moors M.D.   On: 03/22/2022 19:39

## 2022-04-08 NOTE — Telephone Encounter (Signed)
Oral Oncology Patient Advocate Encounter   Received notification that prior authorization for Verzenio is required.   PA submitted on 04/08/2022  Key [id number]  Status is pending     Lady Deutscher, CPhT-Adv Pharmacy Patient Advocate Specialist [place] Patient Advocate Team Direct Number: [phone]  Fax: [phone]

## 2022-04-08 NOTE — Assessment & Plan Note (Addendum)
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  Plan to repeat chest x-ray today to monitor for fluid reaccumulation, she will likely need outpatient thoracentesis through either IR or with one of our physicians in office sometime this week. At somepoint may need pleurx cath placement for ongoing management of malignant pleural effusion. Respiratory wise she is stable today. O2 96% RA. Lung sounds were diminsiehd right lung base. No respiratory distress. If respiratory symptoms worsen advised patient to present to ED.

## 2022-04-09 ENCOUNTER — Other Ambulatory Visit (HOSPITAL_COMMUNITY): Payer: Self-pay

## 2022-04-09 NOTE — Telephone Encounter (Signed)
Oral Oncology Patient Advocate Encounter  Prior Authorization for [name] has been approved.    PA# [id number] Effective dates: 04/09/2022 through 04/10/2023  Patients co-pay is $2,799.20.    [name], CPhT-Adv Pharmacy Patient Advocate Specialist [place] Patient Advocate Team Direct Number: [phone]  Fax: [phone]

## 2022-04-10 ENCOUNTER — Other Ambulatory Visit: Payer: Self-pay

## 2022-04-10 ENCOUNTER — Inpatient Hospital Stay: Payer: BC Managed Care – PPO

## 2022-04-10 ENCOUNTER — Ambulatory Visit (HOSPITAL_COMMUNITY)
Admission: RE | Admit: 2022-04-10 | Discharge: 2022-04-10 | Disposition: A | Discharge status: Home / Self Care | Payer: BC Managed Care – PPO | Source: Ambulatory Visit | Attending: Internal Medicine | Admitting: Internal Medicine

## 2022-04-10 ENCOUNTER — Ambulatory Visit (HOSPITAL_COMMUNITY)
Admission: RE | Admit: 2022-04-10 | Discharge: 2022-04-10 | Disposition: A | Discharge status: Home / Self Care | Payer: BC Managed Care – PPO | Source: Ambulatory Visit | Attending: Primary Care | Admitting: Primary Care

## 2022-04-10 DIAGNOSIS — C50911 Malignant neoplasm of unspecified site of right female breast: Secondary | ICD-10-CM

## 2022-04-10 DIAGNOSIS — J90 Pleural effusion, not elsewhere classified: Secondary | ICD-10-CM | POA: Insufficient documentation

## 2022-04-10 HISTORY — PX: IR THORACENTESIS ASP PLEURAL SPACE W/IMG GUIDE: IMG5380

## 2022-04-10 MED ORDER — LIDOCAINE HCL 1 % IJ SOLN
INTRAMUSCULAR | Status: AC
  Filled 2022-04-10: qty 20

## 2022-04-10 MED ORDER — LIDOCAINE HCL (PF) 1 % IJ SOLN
INTRAMUSCULAR | Status: DC | PRN
  Administered 2022-04-10: 5 mL

## 2022-04-10 NOTE — Procedures (Signed)
PROCEDURE SUMMARY:  Successful US guided therapeutic right thoracentesis. Yielded 1.7 L of clear, yellow fluid. Pt tolerated procedure well. No immediate complications.  Specimen not sent for labs. CXR ordered.  EBL < 1 mL  Tyson Alias, AGNP 04/10/2022 10:30 AM

## 2022-04-10 NOTE — Telephone Encounter (Signed)
Oral Chemotherapy Pharmacist Encounter  I spoke with patient for overview of: Verzenio for the treatment of ER positive, HER2 negative breast cancer, hormone-receptor positive breast cancer, in combination with anastrozole, planned duration until disease progression or unacceptable toxicity.   Counseled patient on administration, dosing, side effects, monitoring, drug-food interactions, safe handling, storage, and disposal.  Patient will take Verzenio 100mg tablets, 1 tablet by mouth twice daily without regard to food.  Patient knows to avoid grapefruit and grapefruit juice.  Verzenio start date: 04/12/22  Adverse effects include but are not limited to: diarrhea, fatigue, nausea, abdominal pain, decreased blood counts, and increased liver function tests, and joint pains. Severe, life-threatening, and/or fatal interstitial lung disease (ILD) and/or pneumonitis may occur with CDK 4/6 inhibitors.  Patient has anti-emetic on hand and knows to take it if nausea develops.   Patient will obtain anti diarrheal and alert the office of 4 or more loose stools above baseline.  Reviewed with patient importance of keeping a medication schedule and plan for any missed doses. No barriers to medication adherence identified.  Medication reconciliation performed and medication/allergy list updated.  Insurance authorization for Verzenio has been obtained. Test claim at the pharmacy revealed copayment $2799.20 for 1st fill of 28 days. Patient will apply for copay card. Patient will pick up medication from [facility].  Patient informed the pharmacy will reach out 5-7 days prior to needing next fill of Verzenio to coordinate continued medication acquisition to prevent break in therapy.  All questions answered.  Ms. Bosler and mother voiced understanding and appreciation.   Medication education handout placed in mail for patient. Patient knows to call the office with questions or concerns. Oral Chemotherapy Clinic  phone number provided to patient.    , PharmD Hematology/Oncology Clinical Pharmacist [place] [facility] [phone] 04/10/2022   4:30 PM  

## 2022-04-10 NOTE — Progress Notes (Signed)
[facility] Clinical Social Work  Initial Assessment   Buena Boehm is a 61 y.o. year old female accompanied by patient and mother. Clinical Social Work was referred by nurse navigator for assessment of psychosocial needs.   SDOH (Social Determinants of Health) assessments performed: Yes SDOH Interventions    Flowsheet Row Most Recent Value  SDOH Interventions   Housing Interventions Intervention Not Indicated       SDOH Screenings   Alcohol Screen: Not on file  Depression (GYJ8-5): Not on file  Financial Resource Strain: Low Risk  (04/03/2022)   Overall Financial Resource Strain (CARDIA)    Difficulty of Paying Living Expenses: Not hard at all  Food Insecurity: No Food Insecurity (04/03/2022)   Hunger Vital Sign    Worried About Running Out of Food in the Last Year: Never true    Ran Out of Food in the Last Year: Never true  Housing: Low Risk  (04/11/2022)   Housing    Last Housing Risk Score: 0  Physical Activity: Not on file  Social Connections: Not on file  Stress: Not on file  Tobacco Use: Low Risk  (04/10/2022)   Patient History    Smoking Tobacco Use: Never    Smokeless Tobacco Use: Never    Passive Exposure: Not on file  Transportation Needs: No Transportation Needs (04/03/2022)   PRAPARE - Transportation    Lack of Transportation (Medical): No    Lack of Transportation (Non-Medical): No     Distress Screen completed: No     No data to display            Family/Social Information:  Housing Arrangement: patient lives alone in a townhouse with steps. Family members/support persons in your life? Family and Friends.  Patient's mother is her primary caregiver.  Patient said she has friends that bring her meals on Sundays. Transportation concerns: yes.  She is having to depend on her mother for all of her transportation needs. Employment: Retired Patient is a retired Pharmacist, hospital.  Income source: Financial risk analyst concerns: No  Patient stated she is on a fixed  income. Type of concern: None Food access concerns: no.  Patient does have difficulty preparing meals due to her inability to stand for long periods of time. Religious or spiritual practice: Not known Services Currently in place:  None  Coping/ Adjustment to diagnosis: Patient understands treatment plan and what happens next? yes Concerns about diagnosis and/or treatment: Overwhelmed by information Patient reported stressors: Transportation and Adjusting to my illness Hopes and/or priorities: Patient is concerned that her mother is having to care for her, so her mother's wellbeing is a priority. Patient enjoys time with family/ friends Current coping skills/ strengths: Armed forces logistics/support/administrative officer , Scientist, research (life sciences) , General fund of knowledge , and Supportive family/friends     SUMMARY: Current SDOH Barriers:  Level of care concerns.  Discussed home care assistance with bathing, cleaning, etc.  Patient stated she wanted to remain in her townhouse and did not wish to move.  Clinical Social Work Clinical Goal(s):  Patient will work with SW to address concerns related to transportation.  Interventions: Discussed common feeling and emotions when being diagnosed with cancer, and the importance of support during treatment Informed patient of the support team roles and support services at [facility] Provided CSW contact information and encouraged patient to call with any questions or concerns Provided patient with information about food home delivery service, [employer] and Corporate treasurer,  handicapped parking application, home care services, [employer] and the  [employer] financial assistance fund.  CSW assisted patient and mother filling out applications and will submit once complete.   Follow Up Plan: CSW will follow-up with patient by phone  Patient verbalizes understanding of plan: Yes    Rodman Pickle Ardella Chhim, LCSW

## 2022-04-11 ENCOUNTER — Encounter: Payer: Self-pay | Admitting: Hematology and Oncology

## 2022-04-11 ENCOUNTER — Other Ambulatory Visit (HOSPITAL_COMMUNITY): Payer: Self-pay

## 2022-04-11 ENCOUNTER — Telehealth: Payer: Self-pay

## 2022-04-11 ENCOUNTER — Telehealth: Payer: Self-pay | Admitting: Pharmacy Technician

## 2022-04-11 NOTE — Telephone Encounter (Signed)
Oral Oncology Patient Advocate Encounter   Was successful in obtaining a copay card for Verzenio.  This copay card will make the patients copay $0.  I have spoken with the patient.    The billing information is as follows and has been shared with [facility].   RxBin: [id number]  PCN: [id number] Member ID: [id number] Group ID: [id number]   Lady Deutscher, CPhT-Adv Pharmacy Patient Advocate Specialist [facility] Patient Advocate Team Direct Number: [phone]  Fax: [phone]

## 2022-04-11 NOTE — Telephone Encounter (Signed)
Called pt's mother to advise that Dr.Gorsuch is unable to sign request for her disability placard. Dr. Alvy Bimler can sign one for pt at her next visit. Mother voiced appreciation for call.

## 2022-04-11 NOTE — Telephone Encounter (Signed)
Appt scheduled

## 2022-04-15 ENCOUNTER — Telehealth: Payer: Self-pay

## 2022-04-15 NOTE — Telephone Encounter (Signed)
Returned call to patient's mother and patient regarding various questions. Patient and mother informed that Dr. Alvy Bimler will assist with filling out forms for handicap placard at appointment on Thursday, July 20th.  During conversation, patient reported intermittent bleeding from right nipple. Patient notes it is only a small amount. Patient reports no pain, bruising, or edema at site. Patient does state that she bumped it today and that may have contributed to the bleeding. Patient and mother informed to monitor site in the meantime and that Dr. Alvy Bimler will assess during upcoming visit. Patient and mother would also like Dr. Alvy Bimler to assess site of recent thoracentesis at request of provider. Note made in chart and forwarded to Dr. Alvy Bimler.  Patient's mother also informed RN that patient had dental cleaning today and wanted to pass along that patient had a good report from the dentist. No further s/s or concerns expressed during call. Patient and mother know to call back if they have any further questions.

## 2022-04-16 NOTE — Telephone Encounter (Signed)
I will address her concerns this week

## 2022-04-17 ENCOUNTER — Other Ambulatory Visit: Payer: Self-pay

## 2022-04-17 ENCOUNTER — Inpatient Hospital Stay: Payer: BC Managed Care – PPO

## 2022-04-17 ENCOUNTER — Encounter: Payer: Self-pay | Admitting: Hematology and Oncology

## 2022-04-17 ENCOUNTER — Inpatient Hospital Stay: Payer: BC Managed Care – PPO | Admitting: Hematology and Oncology

## 2022-04-17 ENCOUNTER — Telehealth (HOSPITAL_COMMUNITY): Payer: Self-pay | Admitting: Dietician

## 2022-04-17 ENCOUNTER — Other Ambulatory Visit: Payer: Self-pay | Admitting: Hematology and Oncology

## 2022-04-17 DIAGNOSIS — D509 Iron deficiency anemia, unspecified: Secondary | ICD-10-CM

## 2022-04-17 DIAGNOSIS — C50911 Malignant neoplasm of unspecified site of right female breast: Secondary | ICD-10-CM | POA: Diagnosis not present

## 2022-04-17 DIAGNOSIS — J90 Pleural effusion, not elsewhere classified: Secondary | ICD-10-CM

## 2022-04-17 DIAGNOSIS — E440 Moderate protein-calorie malnutrition: Secondary | ICD-10-CM | POA: Diagnosis not present

## 2022-04-17 DIAGNOSIS — G893 Neoplasm related pain (acute) (chronic): Secondary | ICD-10-CM | POA: Diagnosis not present

## 2022-04-17 DIAGNOSIS — K5909 Other constipation: Secondary | ICD-10-CM

## 2022-04-17 DIAGNOSIS — C7951 Secondary malignant neoplasm of bone: Secondary | ICD-10-CM

## 2022-04-17 DIAGNOSIS — R5381 Other malaise: Secondary | ICD-10-CM | POA: Insufficient documentation

## 2022-04-17 LAB — COMPREHENSIVE METABOLIC PANEL
ALT: 14 U/L (ref 0–44)
AST: 30 U/L (ref 15–41)
Albumin: 2.7 g/dL — ABNORMAL LOW (ref 3.5–5.0)
Alkaline Phosphatase: 101 U/L (ref 38–126)
Anion gap: 7 (ref 5–15)
BUN: 20 mg/dL (ref 8–23)
CO2: 27 mmol/L (ref 22–32)
Calcium: 8.6 mg/dL — ABNORMAL LOW (ref 8.9–10.3)
Chloride: 105 mmol/L (ref 98–111)
Creatinine, Ser: 1.22 mg/dL — ABNORMAL HIGH (ref 0.44–1.00)
GFR, Estimated: 50 mL/min — ABNORMAL LOW (ref >60)
Glucose, Bld: 103 mg/dL — ABNORMAL HIGH (ref 70–99)
Potassium: 3.8 mmol/L (ref 3.5–5.1)
Sodium: 139 mmol/L (ref 135–145)
Total Bilirubin: 0.4 mg/dL (ref 0.3–1.2)
Total Protein: 6.6 g/dL (ref 6.5–8.1)

## 2022-04-17 LAB — CBC WITH DIFFERENTIAL/PLATELET
Abs Immature Granulocytes: 0.02 10*3/uL (ref 0.00–0.07)
Basophils Absolute: 0.1 10*3/uL (ref 0.0–0.1)
Basophils Relative: 1 %
Eosinophils Absolute: 0.1 10*3/uL (ref 0.0–0.5)
Eosinophils Relative: 1 %
HCT: 30.9 % — ABNORMAL LOW (ref 36.0–46.0)
Hemoglobin: 9.8 g/dL — ABNORMAL LOW (ref 12.0–15.0)
Immature Granulocytes: 0 %
Lymphocytes Relative: 18 %
Lymphs Abs: 1.2 10*3/uL (ref 0.7–4.0)
MCH: 23.1 pg — ABNORMAL LOW (ref 26.0–34.0)
MCHC: 31.7 g/dL (ref 30.0–36.0)
MCV: 72.7 fL — ABNORMAL LOW (ref 80.0–100.0)
Monocytes Absolute: 0.4 10*3/uL (ref 0.1–1.0)
Monocytes Relative: 6 %
Neutro Abs: 4.7 10*3/uL (ref 1.7–7.7)
Neutrophils Relative %: 74 %
Platelets: 536 10*3/uL — ABNORMAL HIGH (ref 150–400)
RBC: 4.25 MIL/uL (ref 3.87–5.11)
RDW: 20.6 % — ABNORMAL HIGH (ref 11.5–15.5)
WBC: 6.4 10*3/uL (ref 4.0–10.5)
nRBC: 0 % (ref 0.0–0.2)

## 2022-04-17 LAB — SAMPLE TO BLOOD BANK

## 2022-04-17 NOTE — Assessment & Plan Note (Signed)
She continues to have progressive weight loss We discussed importance of frequent small meals She will also continue dietitian follow-up

## 2022-04-17 NOTE — Assessment & Plan Note (Signed)
She is reminded to take vitamin D and calcium We will start her on Xgeva next week She has obtained dental clearance

## 2022-04-17 NOTE — Assessment & Plan Note (Signed)
I try my best to explain pathophysiology of malignant pleural effusion She will continue follow-up with pulmonologist

## 2022-04-17 NOTE — Progress Notes (Signed)
[place] OFFICE PROGRESS NOTE  Patient Care Team: Heath Lark, MD as PCP - General (Hematology and Oncology)  ASSESSMENT & PLAN:  Primary breast malignancy [facility]) I reviewed her final pathology report with the sister and explained the rationale of prescribing her current treatment So far, she tolerated treatment well She had received dental clearance Due to poor venous access, I recommend we give her Delton See next week instead of Zometa I will see her again next week to provide supportive care and monitoring for toxicity  Breast cancer metastasized to bone, right ([place]) She is reminded to take vitamin D and calcium We will start her on Xgeva next week She has obtained dental clearance  Cancer associated pain She has adequate pain control She is getting somewhat sedated I recommend the patient to reduce hydromorphone by half  Pleural effusion I try my best to explain pathophysiology of malignant pleural effusion She will continue follow-up with pulmonologist  Malnutrition of moderate degree She continues to have progressive weight loss We discussed importance of frequent small meals She will also continue dietitian follow-up  Microcytic anemia Her blood count is stable She does not need transfusion support  Other constipation We have extensive discussion about regular laxatives  Physical debility I gave her a completed application for disability parking placard  No orders of the defined types were placed in this encounter.   All questions were answered. The patient knows to call the clinic with any problems, questions or concerns. The total time spent in the appointment was 40 minutes encounter with patients including review of chart and various tests results, discussions about plan of care and coordination of care plan   Heath Lark, MD 04/17/2022 2:32 PM  INTERVAL HISTORY: Please see below for problem oriented charting. she returns for treatment  follow-up with her mother and sister They have numerous questions related to her treatment and plan of care She have lost some weight She claims she is eating frequent small meals She had intermittent constipation and takes laxatives sporadically Her pain is well controlled On average, she takes her pain medicine twice daily She continues to have serosanguineous drainage from her right chest.  Her shortness of breath is stable  REVIEW OF SYSTEMS:   Constitutional: Denies fevers, chills or abnormal weight loss Eyes: Denies blurriness of vision Ears, nose, mouth, throat, and face: Denies mucositis or sore throat Respiratory: Denies cough, dyspnea or wheezes Cardiovascular: Denies palpitation, chest discomfort or lower extremity swelling Skin: Denies abnormal skin rashes Lymphatics: Denies new lymphadenopathy or easy bruising Behavioral/Psych: Mood is stable, no new changes  All other systems were reviewed with the patient and are negative.  I have reviewed the past medical history, past surgical history, social history and family history with the patient and they are unchanged from previous note.  ALLERGIES:  has No Known Allergies.  MEDICATIONS:  Current Outpatient Medications  Medication Sig Dispense Refill   abemaciclib (VERZENIO) 100 MG tablet Take 1 tablet (100 mg total) by mouth 2 (two) times daily. 56 tablet 11   anastrozole (ARIMIDEX) 1 MG tablet Take 1 tablet (1 mg total) by mouth daily. 30 tablet 11   calcium carbonate (TUMS - DOSED IN MG ELEMENTAL CALCIUM) 500 MG chewable tablet Chew 1 tablet by mouth 2 (two) times daily.     cholecalciferol (VITAMIN D3) 25 MCG (1000 UNIT) tablet Take 2,000 Units by mouth daily.     HYDROmorphone (DILAUDID) 8 MG tablet Take 1 tablet (8 mg total) by mouth  every 4 (four) hours as needed for moderate pain. 60 tablet 0   megestrol (MEGACE) 40 MG tablet Take 1 tablet (40 mg total) by mouth 2 (two) times daily. 60 tablet 0   melatonin 3 MG TABS  tablet Take 1 tablet (3 mg total) by mouth at bedtime as needed.  0   metroNIDAZOLE (METROGEL) 1 % gel Apply topically daily. 60 g 0   polyethylene glycol (MIRALAX / GLYCOLAX) 17 g packet Take 17 g by mouth daily.     senna (SENOKOT) 8.6 MG tablet Take 2 tablets by mouth 2 (two) times daily.     Sodium Chloride Flush (NORMAL SALINE FLUSH) 0.9 % SOLN Please flush 10 ml to each lumen daily. 300 mL 1   No current facility-administered medications for this visit.    SUMMARY OF ONCOLOGIC HISTORY: Oncology History Overview Note  The tumor cells are EQUIVOCAL For Her2 (2+).   Estrogen Receptor:       POSITIVE, 100%, STRONG STAINING  Progesterone Receptor:   NEGATIVE (0)  Proliferation Marker Ki-67:   45%    Primary breast malignancy ([place])  03/22/2022 Initial Diagnosis   Primary breast malignancy ([place])   03/24/2022 Pathology Results   SURGICAL PATHOLOGY  CASE: [id number]  PATIENT: [name]  Surgical Pathology Report   Clinical History: Ulcerative right breast lesion and probable metastatic breast cancer ([facility])   FINAL MICROSCOPIC DIAGNOSIS:   A. SOFT TISSUE MASS, RIGHT UPPER CHEST WALL, NEEDLE CORE BIOPSY:  - Invasive ductal carcinoma, see comment  - Ductal carcinoma in situ: Not identified   - Tubule formation: Score 3  - Nuclear pleomorphism: Score 3  - Mitotic count: Score 3  - Total score: 9  - Overall Grade: 3  - Lymphovascular invasion: Not identified  - Cancer Length: 13 mm  - Calcifications: Not identified  - Other findings: None    03/28/2022 Cancer Staging   Staging form: Breast, AJCC 8th Edition - Clinical stage from 03/28/2022: Stage IV (cT4d, cN3c, pM1, G3, ER+, PR-, HER2: Equivocal) - Signed by Heath Lark, MD on 03/28/2022 Stage prefix: Initial diagnosis Nuclear grade: G3 Histologic grading system: 3 grade system     PHYSICAL EXAMINATION: ECOG PERFORMANCE STATUS: 2 - Symptomatic, <50% confined to bed  Vitals:   04/17/22 1246  BP: (!) 119/54   Pulse: (!) 111  Resp: 18  Temp: (!) 97.4 F (36.3 C)  SpO2: 97%   Filed Weights   04/17/22 1246  Weight: 138 lb 9.6 oz (62.9 kg)    GENERAL:alert, no distress and comfortable.  She looks thin and cachectic.  Her right arm swelling is somewhat improved NEURO: alert & oriented x 3 with fluent speech, no focal motor/sensory deficits  LABORATORY DATA:  I have reviewed the data as listed    Component Value Date/Time   NA 139 04/17/2022 1202   K 3.8 04/17/2022 1202   CL 105 04/17/2022 1202   CO2 27 04/17/2022 1202   GLUCOSE 103 (H) 04/17/2022 1202   BUN 20 04/17/2022 1202   CREATININE 1.22 (H) 04/17/2022 1202   CALCIUM 8.6 (L) 04/17/2022 1202   PROT 6.6 04/17/2022 1202   ALBUMIN 2.7 (L) 04/17/2022 1202   AST 30 04/17/2022 1202   ALT 14 04/17/2022 1202   ALKPHOS 101 04/17/2022 1202   BILITOT 0.4 04/17/2022 1202   GFRNONAA 50 (L) 04/17/2022 1202    No results found for: "SPEP", "UPEP"  Lab Results  Component Value Date   WBC 6.4 04/17/2022   NEUTROABS  4.7 04/17/2022   HGB 9.8 (L) 04/17/2022   HCT 30.9 (L) 04/17/2022   MCV 72.7 (L) 04/17/2022   PLT 536 (H) 04/17/2022      Chemistry      Component Value Date/Time   NA 139 04/17/2022 1202   K 3.8 04/17/2022 1202   CL 105 04/17/2022 1202   CO2 27 04/17/2022 1202   BUN 20 04/17/2022 1202   CREATININE 1.22 (H) 04/17/2022 1202      Component Value Date/Time   CALCIUM 8.6 (L) 04/17/2022 1202   ALKPHOS 101 04/17/2022 1202   AST 30 04/17/2022 1202   ALT 14 04/17/2022 1202   BILITOT 0.4 04/17/2022 1202       RADIOGRAPHIC STUDIES: I have personally reviewed the radiological images as listed and agreed with the findings in the report. IR THORACENTESIS ASP PLEURAL SPACE W/IMG GUIDE  Result Date: 04/10/2022 INDICATION: Patient with a history of right breast cancer. Patient found to have large right pleural effusion. Request for therapeutic right thoracentesis. EXAM: ULTRASOUND GUIDED THERAPEUTIC RIGHT THORACENTESIS  MEDICATIONS: 10 mL 1 % lidocaine COMPLICATIONS: None immediate. PROCEDURE: An ultrasound guided thoracentesis was thoroughly discussed with the patient and questions answered. The benefits, risks, alternatives and complications were also discussed. The patient understands and wishes to proceed with the procedure. Written consent was obtained. Ultrasound was performed to localize and mark an adequate pocket of fluid in the right chest. The area was then prepped and draped in the normal sterile fashion. 1% Lidocaine was used for local anesthesia. Under ultrasound guidance a 6 Fr Safe-T-Centesis catheter was introduced. Thoracentesis was performed. The catheter was removed and a dressing applied. FINDINGS: A total of approximately 1.7 L of clear, yellow fluid was removed. IMPRESSION: Successful ultrasound guided right thoracentesis yielding 1.7 L of pleural fluid. Read by: Narda Rutherford, AGNP-BC Electronically Signed   By: Aletta Edouard M.D.   On: 04/10/2022 10:47   DG Chest 1 View  Result Date: 04/10/2022 CLINICAL DATA:  Status post right-sided thoracentesis. EXAM: CHEST  1 VIEW COMPARISON:  Chest x-ray 04/08/2022 FINDINGS: Interval evacuation of the large right pleural effusion. Small residual pleural effusion and right basilar atelectasis. No postprocedural pneumothorax. The left lung remains clear. IMPRESSION: Status post right-sided thoracentesis with evacuation of the large right pleural effusion. No postprocedural pneumothorax. Electronically Signed   By: Marijo Sanes M.D.   On: 04/10/2022 09:03   DG Chest 2 View  Result Date: 04/08/2022 CLINICAL DATA:  FU right pleural effusion EXAM: CHEST - 2 VIEW COMPARISON:  Chest x-ray June 25, 23. FINDINGS: Patient rotation. Increased, now large right pleural effusion. Overlying right lung opacities. Left lung is clear. No visible pneumothorax. Visualized cardiomediastinal silhouette is similar. IMPRESSION: Increased, now large right pleural effusion. Overlying  right lung atelectasis and/or consolidation. Electronically Signed   By: Margaretha Sheffield M.D.   On: 04/08/2022 13:20   IR US Guide Bx Asp/Drain  Result Date: 03/24/2022 INDICATION: 61 year old with probable metastatic breast cancer and tissue diagnosis is needed. Patient has a palpable anterior chest wall mass. EXAM: ULTRASOUND-GUIDED CORE BIOPSY OF RIGHT ANTERIOR CHEST WALL MASS MEDICATIONS: 1% lidocaine, local anesthetic ANESTHESIA/SEDATION: None FLUOROSCOPY TIME:  None COMPLICATIONS: None immediate. PROCEDURE: Informed written consent was obtained from the patient after a thorough discussion of the procedural risks, benefits and alternatives. All questions were addressed. A timeout was performed prior to the initiation of the procedure. There is a large soft tissue mass along the medial and anterior upper chest wall. Patient has a dressing  over the ulcerative right breast mass. The right anterior chest wall mass was prepped with chlorhexidine and sterile field was created. Skin was anesthetized using 1% lidocaine. A small incision was made. Using ultrasound guidance, an 18 gauge core device was directed into the lesion. Several core biopsies were obtained. Adequate amount of tissue was obtained. Specimens placed in formalin. Bandage placed over the puncture site. FINDINGS: Slightly heterogeneous superficial mass in the right anterior chest wall. Needle position confirmed within the lesion. IMPRESSION: Ultrasound-guided core biopsy of the right anterior chest wall mass. Electronically Signed   By: Markus Daft M.D.   On: 03/24/2022 18:11   IR PICC PLACEMENT LEFT >5 YRS INC IMG GUIDE  Result Date: 03/24/2022 INDICATION: 61 year old with an ulcerative right breast mass and evidence of metastatic disease. Patient needs a central venous catheter for treatment. Left arm PICC line was selected rather than a Port-A-Cath due to the disease in left breast. EXAM: LEFT ARM PICC LINE PLACEMENT WITH ULTRASOUND AND  FLUOROSCOPIC GUIDANCE MEDICATIONS: 1% lidocaine, moderate sedation ANESTHESIA/SEDATION: None FLUOROSCOPY: Radiation Exposure Index (as provided by the fluoroscopic device): 1 mGy Kerma COMPLICATIONS: None immediate. PROCEDURE: The patient was advised of the possible risks and complications and agreed to undergo the procedure. The patient was then brought to the angiographic suite for the procedure. The left arm was prepped with chlorhexidine, draped in the usual sterile fashion using maximum barrier technique (cap and mask, sterile gown, sterile gloves, large sterile sheet, hand hygiene and cutaneous antiseptic). Local anesthesia was attained by infiltration with 1% lidocaine. Ultrasound demonstrated patency of the left basilic vein, and this was documented with an image. Under real-time ultrasound guidance, this vein was accessed with a 21 gauge micropuncture needle and image documentation was performed. The needle was exchanged over a guidewire for a peel-away sheath through which a 41 cm 5 [place] dual lumen power injectable PICC was advanced, and positioned with its tip at the lower SVC/right atrial junction. Fluoroscopy during the procedure and fluoro spot radiograph confirms appropriate catheter position. The catheter was flushed, secured to the skin, and covered with a sterile dressing. IMPRESSION: Successful placement of a left arm PICC with sonographic and fluoroscopic guidance. The catheter is ready for use. Electronically Signed   By: Markus Daft M.D.   On: 03/24/2022 18:07   DG Chest 1 View  Result Date: 03/23/2022 CLINICAL DATA:  Status post thoracentesis. EXAM: CHEST  1 VIEW COMPARISON:  CT 03/22/2022 FINDINGS: Stable cardiomediastinal contours. Interval decreased volume of the right pleural effusion status post thoracentesis. No pneumothorax identified. Persistence of atelectasis within the right lower lung. Left lung clear. IMPRESSION: Decrease in right pleural effusion status post thoracentesis.  No pneumothorax. Electronically Signed   By: Kerby Moors M.D.   On: 03/23/2022 12:30   CT ABDOMEN PELVIS W CONTRAST  Result Date: 03/22/2022 CLINICAL DATA:  Chest mass, metastatic evaluation * Tracking Code: BO * EXAM: CT ABDOMEN AND PELVIS WITH CONTRAST TECHNIQUE: Multidetector CT imaging of the abdomen and pelvis was performed using the standard protocol following bolus administration of intravenous contrast. RADIATION DOSE REDUCTION: This exam was performed according to the departmental dose-optimization program which includes automated exposure control, adjustment of the mA and/or kV according to patient size and/or use of iterative reconstruction technique. CONTRAST:  147m OMNIPAQUE IOHEXOL 350 MG/ML SOLN COMPARISON:  None Available. FINDINGS: Lower chest: Please see separately reported examination of the chest. Hepatobiliary: Multiple low-attenuation lesions scattered throughout the liver, the largest of which appear to be fluid attenuation  simple cysts or hemangiomata, others too small to characterize. No gallstones, gallbladder wall thickening, or biliary dilatation. Pancreas: Unremarkable. No pancreatic ductal dilatation or surrounding inflammatory changes. Spleen: Normal in size without significant abnormality. Adrenals/Urinary Tract: Adrenal glands are unremarkable. Kidneys are normal, without renal calculi, solid lesion, or hydronephrosis. Bladder is unremarkable. Stomach/Bowel: Stomach is within normal limits. Appendix appears normal. No evidence of bowel wall thickening, distention, or inflammatory changes. Vascular/Lymphatic: Superficial venous collateralization about the ventral abdomen, likely secondary to central venous obstruction in the chest. No enlarged abdominal or pelvic lymph nodes. Reproductive: No mass or other significant abnormality. Other: No abdominal wall hernia or abnormality.  Anasarca. Musculoskeletal: Lytic lesion of left aspect of the L4 vertebral body and left pedicle.  IMPRESSION: 1. Multiple low-attenuation lesions scattered throughout the liver, the largest of which appear to be fluid attenuation simple cysts or hemangiomata, others too small to characterize. Hepatic metastases not excluded. 2. Lytic lesion of left aspect of the L4 vertebral body and left pedicle, consistent with osseous metastatic disease. 3. Superficial venous collateralization about the ventral abdomen, likely secondary to central venous obstruction in the chest. 4. Anasarca. Electronically Signed   By: Delanna Ahmadi M.D.   On: 03/22/2022 20:04   CT Angio Chest PE W and/or Wo Contrast  Result Date: 03/22/2022 CLINICAL DATA:  PE suspected, metastatic disease, likely breast * Tracking Code: BO * EXAM: CT ANGIOGRAPHY CHEST WITH CONTRAST TECHNIQUE: Multidetector CT imaging of the chest was performed using the standard protocol during bolus administration of intravenous contrast. Multiplanar CT image reconstructions and MIPs were obtained to evaluate the vascular anatomy. RADIATION DOSE REDUCTION: This exam was performed according to the departmental dose-optimization program which includes automated exposure control, adjustment of the mA and/or kV according to patient size and/or use of iterative reconstruction technique. CONTRAST:  195m OMNIPAQUE IOHEXOL 350 MG/ML SOLN COMPARISON:  None Available. FINDINGS: Cardiovascular: Examination for pulmonary embolism by breath motion artifact throughout and atelectasis of the right lung; within this limitation, no evidence pulmonary embolism through the proximal segmental pulmonary arterial level. Normal heart size. No pericardial effusion. Mediastinum/Nodes: No enlarged mediastinal, hilar, or axillary lymph nodes. Thyroid gland, trachea, and esophagus demonstrate no significant findings. Lungs/Pleura: Large right pleural effusion associated atelectasis or consolidation. Upper Abdomen: Please see separately reported examination of the abdomen and pelvis.  Musculoskeletal: Bulky, confluent, ulcerated mass of the right breast, with very extensive skin thickening nodularity extending across essentially the entire right chest wall and into the underlying musculature, as well as contralaterally to involve the left breast with severe skin thickening (series 4, image 69). Lytic osseous lesions, for example of the right fifth rib (series 4, image 51) and sternal body (series 7, image 83). Lytic pathologic fracture deformity of the T3 vertebral body (series 7, image 83). Review of the MIP images confirms the above findings. IMPRESSION: 1. Examination for pulmonary embolism limited by breath motion artifact throughout and atelectasis of the right lung; within this limitation, no evidence of pulmonary embolism through the proximal segmental pulmonary arterial level. 2. Bulky, confluent, ulcerated mass of the right breast, with very extensive skin thickening nodularity extending across essentially the entire right chest wall and into the underlying musculature, as well as contralaterally to involve the left breast with severe skin thickening. 3. Lytic osseous lesions, for example of the right fifth rib and sternal body. Lytic pathologic fracture deformity of the T3 vertebral body. 4. Large right pleural effusion and associated atelectasis or consolidation, presumed malignant however without directly visualized  nodularity or pleural thickening. 5. Findings are consistent with advanced primary breast malignancy and associated metastatic disease. Electronically Signed   By: Delanna Ahmadi M.D.   On: 03/22/2022 19:58   CT Head Wo Contrast  Result Date: 03/22/2022 CLINICAL DATA:  Evaluate for brain metastases. EXAM: CT HEAD WITHOUT CONTRAST TECHNIQUE: Contiguous axial images were obtained from the base of the skull through the vertex without intravenous contrast. RADIATION DOSE REDUCTION: This exam was performed according to the departmental dose-optimization program which  includes automated exposure control, adjustment of the mA and/or kV according to patient size and/or use of iterative reconstruction technique. COMPARISON:  None Available. FINDINGS: Brain: No evidence of acute infarction, hemorrhage, hydrocephalus, extra-axial collection or mass lesion/mass effect. Vascular: No hyperdense vessel or unexpected calcification. Skull: Normal. Negative for fracture or focal lesion. Sinuses/Orbits: No acute finding. Other: None. IMPRESSION: 1. No acute intracranial abnormalities.  Normal brain. Electronically Signed   By: Kerby Moors M.D.   On: 03/22/2022 19:39

## 2022-04-17 NOTE — Assessment & Plan Note (Signed)
We have extensive discussion about regular laxatives

## 2022-04-17 NOTE — Assessment & Plan Note (Signed)
I gave her a completed application for disability parking placard

## 2022-04-17 NOTE — Assessment & Plan Note (Signed)
She has adequate pain control She is getting somewhat sedated I recommend the patient to reduce hydromorphone by half

## 2022-04-17 NOTE — Assessment & Plan Note (Signed)
I reviewed her final pathology report with the sister and explained the rationale of prescribing her current treatment So far, she tolerated treatment well She had received dental clearance Due to poor venous access, I recommend we give her Delton See next week instead of Zometa I will see her again next week to provide supportive care and monitoring for toxicity

## 2022-04-17 NOTE — Assessment & Plan Note (Signed)
Her blood count is stable She does not need transfusion support 

## 2022-04-17 NOTE — Telephone Encounter (Signed)
Received secure chat from Dr. Alvy Bimler with request to contact pt via telephone. Patient did not answer. Unable to leave message as VM is full at this time. Will make second attempt to contact patient 7/21 via telephone during scheduled appointment time.

## 2022-04-18 ENCOUNTER — Inpatient Hospital Stay: Payer: BC Managed Care – PPO | Admitting: Dietician

## 2022-04-18 ENCOUNTER — Telehealth: Payer: Self-pay | Admitting: Dietician

## 2022-04-18 ENCOUNTER — Other Ambulatory Visit (HOSPITAL_COMMUNITY): Payer: Self-pay

## 2022-04-18 NOTE — Telephone Encounter (Signed)
Nutrition Assessment   Reason for Assessment: Provider referral    ASSESSMENT: 61 year old female with breast cancer metastatic to bone. She is currently receiving anastrozole. Plans to start xgeva q12w (first 7/27). Patient is under the care of Dr. Alvy Bimler  Past medical history includes HTN, pleural effusion, AKI, microcytic anemia, malnutrition of moderate degree, physical debility, thrombocytosis, cellulitis.   Spoke with patient via telephone. She reports doing well today. Patient endorses good appetite. Says she does not understand why she continues to loose weight. Patient drinks premier protein (170 kcal, 30 g protein) in the morning followed by bowl of cereal or fruit bar for breakfast. She eats fruit (peaches, oranges, apples) for a snack. She recalls eating variety of lunches including pizza, shrimp, broccoli, vegetables. Had chicken livers and broccoli for dinner last night. Sometimes will have a couple peanut butter crackers before bed. Reports ~6 glasses of water and powerade. Patient denies nausea, vomiting, diarrhea, constipation.   Nutrition Focused Physical Exam: unable to complete (telephone visit)   Medications: calcium carbonate, verzenio, D3, dilaudid, megace, melatonin, senokot, miralax   Labs: 7/20 - glucose 103, Cr 1.22   Anthropometrics:   Height: 5' 6.5" Weight: 138 lb 9.6 oz  UBW: 180 lb (last year per pt - wt history limited per chart)  BMI: 22.04  7/11 - 143 lb 9.6 oz 7/3 - 145 lb 12.8 oz 6/25 - 137 lb 2 oz    NUTRITION DIAGNOSIS: Unintentional weight loss related to cancer as evidenced by 7 lb (~5%) wt loss in 17 days; significant    INTERVENTION:  Encouraged 3 meals plus 3 snacks with adequate calories and protein - will mail handout with ideas Discussed foods with protein and ways to add calories to foods (switch to whole milk, add cheese, cook with butter, add supplement to ice cream) will mail protein food list + shake recipes Suggested  switching to Ensure Plus/equivalent for added calories, recommend 2/day - will mail coupons Will provide one complimentary case of Ensure Plus High Protein. Pt will pick this up Thursday 7/27 at registration (she is aware) Contact information provided   MONITORING, EVALUATION, GOAL: Patient will tolerate increased calories and protein to minimize further weight loss/promote weight gain   Next Visit: To be scheduled as needed

## 2022-04-21 ENCOUNTER — Ambulatory Visit: Payer: BC Managed Care – PPO | Admitting: Primary Care

## 2022-04-23 ENCOUNTER — Other Ambulatory Visit (HOSPITAL_COMMUNITY): Payer: Self-pay

## 2022-04-24 ENCOUNTER — Inpatient Hospital Stay: Payer: BC Managed Care – PPO

## 2022-04-24 ENCOUNTER — Encounter: Payer: Self-pay | Admitting: Hematology and Oncology

## 2022-04-24 ENCOUNTER — Other Ambulatory Visit: Payer: Self-pay

## 2022-04-24 ENCOUNTER — Inpatient Hospital Stay (HOSPITAL_BASED_OUTPATIENT_CLINIC_OR_DEPARTMENT_OTHER): Payer: BC Managed Care – PPO | Admitting: Hematology and Oncology

## 2022-04-24 VITALS — BP 112/63 | HR 108 | Resp 18 | Ht 66.5 in | Wt 137.2 lb

## 2022-04-24 DIAGNOSIS — G893 Neoplasm related pain (acute) (chronic): Secondary | ICD-10-CM | POA: Diagnosis not present

## 2022-04-24 DIAGNOSIS — R5381 Other malaise: Secondary | ICD-10-CM | POA: Diagnosis not present

## 2022-04-24 DIAGNOSIS — C50911 Malignant neoplasm of unspecified site of right female breast: Secondary | ICD-10-CM

## 2022-04-24 DIAGNOSIS — E440 Moderate protein-calorie malnutrition: Secondary | ICD-10-CM | POA: Diagnosis not present

## 2022-04-24 DIAGNOSIS — C7951 Secondary malignant neoplasm of bone: Secondary | ICD-10-CM

## 2022-04-24 DIAGNOSIS — D509 Iron deficiency anemia, unspecified: Secondary | ICD-10-CM

## 2022-04-24 LAB — COMPREHENSIVE METABOLIC PANEL
ALT: 18 U/L (ref 0–44)
AST: 31 U/L (ref 15–41)
Albumin: 3.2 g/dL — ABNORMAL LOW (ref 3.5–5.0)
Alkaline Phosphatase: 102 U/L (ref 38–126)
Anion gap: 7 (ref 5–15)
BUN: 17 mg/dL (ref 8–23)
CO2: 27 mmol/L (ref 22–32)
Calcium: 8.9 mg/dL (ref 8.9–10.3)
Chloride: 106 mmol/L (ref 98–111)
Creatinine, Ser: 1.1 mg/dL — ABNORMAL HIGH (ref 0.44–1.00)
GFR, Estimated: 57 mL/min — ABNORMAL LOW (ref >60)
Glucose, Bld: 80 mg/dL (ref 70–99)
Potassium: 4.1 mmol/L (ref 3.5–5.1)
Sodium: 140 mmol/L (ref 135–145)
Total Bilirubin: 0.4 mg/dL (ref 0.3–1.2)
Total Protein: 7.5 g/dL (ref 6.5–8.1)

## 2022-04-24 LAB — CBC WITH DIFFERENTIAL/PLATELET
Abs Immature Granulocytes: 0.02 10*3/uL (ref 0.00–0.07)
Basophils Absolute: 0.1 10*3/uL (ref 0.0–0.1)
Basophils Relative: 1 %
Eosinophils Absolute: 0.1 10*3/uL (ref 0.0–0.5)
Eosinophils Relative: 1 %
HCT: 30.3 % — ABNORMAL LOW (ref 36.0–46.0)
Hemoglobin: 9.7 g/dL — ABNORMAL LOW (ref 12.0–15.0)
Immature Granulocytes: 0 %
Lymphocytes Relative: 16 %
Lymphs Abs: 1 10*3/uL (ref 0.7–4.0)
MCH: 23.4 pg — ABNORMAL LOW (ref 26.0–34.0)
MCHC: 32 g/dL (ref 30.0–36.0)
MCV: 73 fL — ABNORMAL LOW (ref 80.0–100.0)
Monocytes Absolute: 0.1 10*3/uL (ref 0.1–1.0)
Monocytes Relative: 2 %
Neutro Abs: 5.1 10*3/uL (ref 1.7–7.7)
Neutrophils Relative %: 80 %
Platelets: 459 10*3/uL — ABNORMAL HIGH (ref 150–400)
RBC: 4.15 MIL/uL (ref 3.87–5.11)
RDW: 21.6 % — ABNORMAL HIGH (ref 11.5–15.5)
WBC: 6.5 10*3/uL (ref 4.0–10.5)
nRBC: 0 % (ref 0.0–0.2)

## 2022-04-24 LAB — FUNGUS CULTURE WITH STAIN

## 2022-04-24 LAB — FUNGAL ORGANISM REFLEX

## 2022-04-24 LAB — FUNGUS CULTURE RESULT

## 2022-04-24 LAB — SAMPLE TO BLOOD BANK

## 2022-04-24 MED ORDER — DENOSUMAB 120 MG/1.7ML ~~LOC~~ SOLN
120.0000 mg | Freq: Once | SUBCUTANEOUS | Status: AC
  Administered 2022-04-24: 120 mg via SUBCUTANEOUS
  Filled 2022-04-24: qty 1.7

## 2022-04-24 NOTE — Patient Instructions (Signed)
Denosumab injection What is this medication? DENOSUMAB (den oh sue mab) slows bone breakdown. Prolia is used to treat osteoporosis in women after menopause and in men, and in people who are taking corticosteroids for 6 months or more. Xgeva is used to treat a high calcium level due to cancer and to prevent bone fractures and other bone problems caused by multiple myeloma or cancer bone metastases. Xgeva is also used to treat giant cell tumor of the bone. This medicine may be used for other purposes; ask your health care provider or pharmacist if you have questions. COMMON BRAND NAME(S): Prolia, XGEVA What should I tell my care team before I take this medication? They need to know if you have any of these conditions: dental disease having surgery or tooth extraction infection kidney disease low levels of calcium or Vitamin D in the blood malnutrition on hemodialysis skin conditions or sensitivity thyroid or parathyroid disease an unusual reaction to denosumab, other medicines, foods, dyes, or preservatives pregnant or trying to get pregnant breast-feeding How should I use this medication? This medicine is for injection under the skin. It is given by a health care professional in a hospital or clinic setting. A special MedGuide will be given to you before each treatment. Be sure to read this information carefully each time. For Prolia, talk to your pediatrician regarding the use of this medicine in children. Special care may be needed. For Xgeva, talk to your pediatrician regarding the use of this medicine in children. While this drug may be prescribed for children as young as 13 years for selected conditions, precautions do apply. Overdosage: If you think you have taken too much of this medicine contact a poison control center or emergency room at once. NOTE: This medicine is only for you. Do not share this medicine with others. What if I miss a dose? It is important not to miss your dose.  Call your doctor or health care professional if you are unable to keep an appointment. What may interact with this medication? Do not take this medicine with any of the following medications: other medicines containing denosumab This medicine may also interact with the following medications: medicines that lower your chance of fighting infection steroid medicines like prednisone or cortisone This list may not describe all possible interactions. Give your health care provider a list of all the medicines, herbs, non-prescription drugs, or dietary supplements you use. Also tell them if you smoke, drink alcohol, or use illegal drugs. Some items may interact with your medicine. What should I watch for while using this medication? Visit your doctor or health care professional for regular checks on your progress. Your doctor or health care professional may order blood tests and other tests to see how you are doing. Call your doctor or health care professional for advice if you get a fever, chills or sore throat, or other symptoms of a cold or flu. Do not treat yourself. This drug may decrease your body's ability to fight infection. Try to avoid being around people who are sick. You should make sure you get enough calcium and vitamin D while you are taking this medicine, unless your doctor tells you not to. Discuss the foods you eat and the vitamins you take with your health care professional. See your dentist regularly. Brush and floss your teeth as directed. Before you have any dental work done, tell your dentist you are receiving this medicine. Do not become pregnant while taking this medicine or for 5 months after   stopping it. Talk with your doctor or health care professional about your birth control options while taking this medicine. Women should inform their doctor if they wish to become pregnant or think they might be pregnant. There is a potential for serious side effects to an unborn child. Talk to  your health care professional or pharmacist for more information. What side effects may I notice from receiving this medication? Side effects that you should report to your doctor or health care professional as soon as possible: allergic reactions like skin rash, itching or hives, swelling of the face, lips, or tongue bone pain breathing problems dizziness jaw pain, especially after dental work redness, blistering, peeling of the skin signs and symptoms of infection like fever or chills; cough; sore throat; pain or trouble passing urine signs of low calcium like fast heartbeat, muscle cramps or muscle pain; pain, tingling, numbness in the hands or feet; seizures unusual bleeding or bruising unusually weak or tired Side effects that usually do not require medical attention (report to your doctor or health care professional if they continue or are bothersome): constipation diarrhea headache joint pain loss of appetite muscle pain runny nose tiredness upset stomach This list may not describe all possible side effects. Call your doctor for medical advice about side effects. You may report side effects to FDA at 1-800-FDA-1088. Where should I keep my medication? This medicine is only given in a clinic, doctor's office, or other health care setting and will not be stored at home. NOTE: This sheet is a summary. It may not cover all possible information. If you have questions about this medicine, talk to your doctor, pharmacist, or health care provider.  2023 Elsevier/Gold Standard (2018-01-22 00:00:00)  

## 2022-04-24 NOTE — Assessment & Plan Note (Signed)
Overall, she is responding to treatment clinically Her swelling on the right upper extremity is less Her nutritional status has improved She will continue abemaciclib in combination with anastrozole I plan to repeat imaging study in 3 months

## 2022-04-24 NOTE — Assessment & Plan Note (Signed)
She continues to have progressive weight loss but her serum albumin is improved We discussed importance of frequent small meals She will also continue dietitian follow-up

## 2022-04-24 NOTE — Assessment & Plan Note (Signed)
We discussed limitation of getting her admitted directly to a skilled nursing facility I recommend outpatient physical therapy and rehab

## 2022-04-24 NOTE — Progress Notes (Signed)
[name] OFFICE PROGRESS NOTE  Patient Care Team: Heath Lark, MD as PCP - General (Hematology and Oncology)  ASSESSMENT & PLAN:  Primary breast malignancy ([place]) Overall, she is responding to treatment clinically Her swelling on the right upper extremity is less Her nutritional status has improved She will continue abemaciclib in combination with anastrozole I plan to repeat imaging study in 3 months  Breast cancer metastasized to bone, right ([place]) She is reminded to take vitamin D and calcium We will start her on Xgeva today She has obtained dental clearance  Cancer associated pain She has adequate pain control I recommend the patient to reduce hydromorphone by half  Malnutrition of moderate degree She continues to have progressive weight loss but her serum albumin is improved We discussed importance of frequent small meals She will also continue dietitian follow-up  Physical debility We discussed limitation of getting her admitted directly to a skilled nursing facility I recommend outpatient physical therapy and rehab  Orders Placed This Encounter  Procedures   Ambulatory referral to Physical Therapy    Referral Priority:   Routine    Referral Type:   Physical Medicine    Referral Reason:   Specialty Services Required    Requested Specialty:   Physical Therapy    Number of Visits Requested:   1    All questions were answered. The patient knows to call the clinic with any problems, questions or concerns. The total time spent in the appointment was 30 minutes encounter with patients including review of chart and various tests results, discussions about plan of care and coordination of care plan   Heath Lark, MD 04/24/2022 2:43 PM  INTERVAL HISTORY: Please see below for problem oriented charting. she returns for treatment follow-up with her mother She is on combination of anastrozole and abemaciclib for treatment of metastatic breast cancer Her pain  is better controlled On average, she is only taking Dilaudid twice daily Her constipation has resolved She is eating frequent small meals She is debilitated and wondering whether she can be directly admitted to a skilled nursing facility/assisted living facility  REVIEW OF SYSTEMS:   Constitutional: Denies fevers, chills  Eyes: Denies blurriness of vision Ears, nose, mouth, throat, and face: Denies mucositis or sore throat Respiratory: Denies cough, dyspnea or wheezes Cardiovascular: Denies palpitation, chest discomfort or lower extremity swelling Gastrointestinal:  Denies nausea, heartburn or change in bowel habits Skin: Denies abnormal skin rashes Lymphatics: Denies new lymphadenopathy or easy bruising Behavioral/Psych: Mood is stable, no new changes  All other systems were reviewed with the patient and are negative.  I have reviewed the past medical history, past surgical history, social history and family history with the patient and they are unchanged from previous note.  ALLERGIES:  has No Known Allergies.  MEDICATIONS:  Current Outpatient Medications  Medication Sig Dispense Refill   abemaciclib (VERZENIO) 100 MG tablet Take 1 tablet (100 mg total) by mouth 2 (two) times daily. 56 tablet 11   anastrozole (ARIMIDEX) 1 MG tablet Take 1 tablet (1 mg total) by mouth daily. 30 tablet 11   calcium carbonate (TUMS - DOSED IN MG ELEMENTAL CALCIUM) 500 MG chewable tablet Chew 1 tablet by mouth 2 (two) times daily.     cholecalciferol (VITAMIN D3) 25 MCG (1000 UNIT) tablet Take 2,000 Units by mouth daily.     HYDROmorphone (DILAUDID) 8 MG tablet Take 1 tablet (8 mg total) by mouth every 4 (four) hours as needed for moderate pain. [place]  tablet 0   megestrol (MEGACE) 40 MG tablet Take 1 tablet (40 mg total) by mouth 2 (two) times daily. 60 tablet 0   melatonin 3 MG TABS tablet Take 1 tablet (3 mg total) by mouth at bedtime as needed.  0   metroNIDAZOLE (METROGEL) 1 % gel Apply topically  daily. 60 g 0   polyethylene glycol (MIRALAX / GLYCOLAX) 17 g packet Take 17 g by mouth daily.     senna (SENOKOT) 8.6 MG tablet Take 2 tablets by mouth 2 (two) times daily.     Sodium Chloride Flush (NORMAL SALINE FLUSH) 0.9 % SOLN Please flush 10 ml to each lumen daily. 300 mL 1   No current facility-administered medications for this visit.    SUMMARY OF ONCOLOGIC HISTORY: Oncology History Overview Note  The tumor cells are EQUIVOCAL For Her2 (2+).   Estrogen Receptor:       POSITIVE, 100%, STRONG STAINING  Progesterone Receptor:   NEGATIVE (0)  Proliferation Marker Ki-67:   45%    Primary breast malignancy ([place])  03/22/2022 Initial Diagnosis   Primary breast malignancy ([place])   03/24/2022 Pathology Results   SURGICAL PATHOLOGY  CASE: [phone]  PATIENT: [name]  Surgical Pathology Report   Clinical History: Ulcerative right breast lesion and probable metastatic breast cancer ([facility])   FINAL MICROSCOPIC DIAGNOSIS:   A. SOFT TISSUE MASS, RIGHT UPPER CHEST WALL, NEEDLE CORE BIOPSY:  - Invasive ductal carcinoma, see comment  - Ductal carcinoma in situ: Not identified   - Tubule formation: Score 3  - Nuclear pleomorphism: Score 3  - Mitotic count: Score 3  - Total score: 9  - Overall Grade: 3  - Lymphovascular invasion: Not identified  - Cancer Length: 13 mm  - Calcifications: Not identified  - Other findings: None    03/28/2022 Cancer Staging   Staging form: Breast, AJCC 8th Edition - Clinical stage from 03/28/2022: Stage IV (cT4d, cN3c, pM1, G3, ER+, PR-, HER2: Equivocal) - Signed by Heath Lark, MD on 03/28/2022 Stage prefix: Initial diagnosis Nuclear grade: G3 Histologic grading system: 3 grade system     PHYSICAL EXAMINATION: ECOG PERFORMANCE STATUS: 2 - Symptomatic, <50% confined to bed  Vitals:   04/24/22 1136  BP: 112/63  Pulse: (!) 108  Resp: 18  SpO2: 98%   Filed Weights   04/24/22 1136  Weight: 137 lb 3.2 oz (62.2 kg)    GENERAL:alert,  no distress and comfortable NEURO: alert & oriented x 3 with fluent speech, no focal motor/sensory deficits  LABORATORY DATA:  I have reviewed the data as listed    Component Value Date/Time   NA 140 04/24/2022 1058   K 4.1 04/24/2022 1058   CL 106 04/24/2022 1058   CO2 27 04/24/2022 1058   GLUCOSE 80 04/24/2022 1058   BUN 17 04/24/2022 1058   CREATININE 1.10 (H) 04/24/2022 1058   CALCIUM 8.9 04/24/2022 1058   PROT 7.5 04/24/2022 1058   ALBUMIN 3.2 (L) 04/24/2022 1058   AST 31 04/24/2022 1058   ALT 18 04/24/2022 1058   ALKPHOS 102 04/24/2022 1058   BILITOT 0.4 04/24/2022 1058   GFRNONAA 57 (L) 04/24/2022 1058    No results found for: "SPEP", "UPEP"  Lab Results  Component Value Date   WBC 6.5 04/24/2022   NEUTROABS 5.1 04/24/2022   HGB 9.7 (L) 04/24/2022   HCT 30.3 (L) 04/24/2022   MCV 73.0 (L) 04/24/2022   PLT 459 (H) 04/24/2022      Chemistry  Component Value Date/Time   NA 140 04/24/2022 1058   K 4.1 04/24/2022 1058   CL 106 04/24/2022 1058   CO2 27 04/24/2022 1058   BUN 17 04/24/2022 1058   CREATININE 1.10 (H) 04/24/2022 1058      Component Value Date/Time   CALCIUM 8.9 04/24/2022 1058   ALKPHOS 102 04/24/2022 1058   AST 31 04/24/2022 1058   ALT 18 04/24/2022 1058   BILITOT 0.4 04/24/2022 1058

## 2022-04-24 NOTE — Assessment & Plan Note (Signed)
She is reminded to take vitamin D and calcium We will start her on Xgeva today She has obtained dental clearance

## 2022-04-24 NOTE — Assessment & Plan Note (Signed)
She has adequate pain control I recommend the patient to reduce hydromorphone by half

## 2022-04-25 ENCOUNTER — Inpatient Hospital Stay: Payer: BC Managed Care – PPO

## 2022-04-25 ENCOUNTER — Telehealth: Payer: Self-pay

## 2022-04-25 NOTE — Progress Notes (Signed)
[place] CSW Progress Note  Clinical Education officer, museum contacted patient by phone to discuss Assisted Living options per patients request.  Informed her that based on her current financial situation, she would have to pay privately.  Patient had not heard back from [employer], food assistance program.  CSW emailed patient's application again, and cc'd patient on email.  CSW made referral to [employer] per patient's request and informed her that the waiting list is very long.  She also asked about home care, which CSW provided resources to her mother during the last visit.  Informed her that would be private pay also.  She said she understood.  Another session is scheduled for next Friday, 8/4, at [place], LCSW

## 2022-04-25 NOTE — Telephone Encounter (Signed)
Returned Mom's call. She misplaced her the garage door opener yesterday and is asking if the office found the opener. Told her no one has turned in one to the lost and found. The office will call her if we find one. She verbalized understanding and appreciated the call.

## 2022-04-30 ENCOUNTER — Other Ambulatory Visit (HOSPITAL_COMMUNITY): Payer: Self-pay

## 2022-05-02 ENCOUNTER — Inpatient Hospital Stay: Payer: BC Managed Care – PPO | Attending: Hematology and Oncology

## 2022-05-02 ENCOUNTER — Other Ambulatory Visit: Payer: Self-pay | Admitting: Hematology and Oncology

## 2022-05-02 ENCOUNTER — Other Ambulatory Visit (HOSPITAL_COMMUNITY): Payer: Self-pay

## 2022-05-02 DIAGNOSIS — C50911 Malignant neoplasm of unspecified site of right female breast: Secondary | ICD-10-CM | POA: Insufficient documentation

## 2022-05-02 DIAGNOSIS — J90 Pleural effusion, not elsewhere classified: Secondary | ICD-10-CM | POA: Insufficient documentation

## 2022-05-02 DIAGNOSIS — Z170 Estrogen receptor positive status [ER+]: Secondary | ICD-10-CM | POA: Insufficient documentation

## 2022-05-02 DIAGNOSIS — Z79811 Long term (current) use of aromatase inhibitors: Secondary | ICD-10-CM | POA: Insufficient documentation

## 2022-05-02 DIAGNOSIS — D61810 Antineoplastic chemotherapy induced pancytopenia: Secondary | ICD-10-CM | POA: Insufficient documentation

## 2022-05-02 DIAGNOSIS — G893 Neoplasm related pain (acute) (chronic): Secondary | ICD-10-CM | POA: Insufficient documentation

## 2022-05-02 DIAGNOSIS — K5909 Other constipation: Secondary | ICD-10-CM | POA: Insufficient documentation

## 2022-05-02 DIAGNOSIS — E440 Moderate protein-calorie malnutrition: Secondary | ICD-10-CM | POA: Insufficient documentation

## 2022-05-02 DIAGNOSIS — Z79899 Other long term (current) drug therapy: Secondary | ICD-10-CM | POA: Insufficient documentation

## 2022-05-02 DIAGNOSIS — C7951 Secondary malignant neoplasm of bone: Secondary | ICD-10-CM | POA: Insufficient documentation

## 2022-05-02 MED ORDER — HYDROMORPHONE HCL 8 MG PO TABS
8.0000 mg | ORAL_TABLET | ORAL | 0 refills | Status: DC | PRN
  Filled 2022-05-02: qty 60, 10d supply, fill #0

## 2022-05-02 NOTE — Progress Notes (Signed)
[place] CSW Progress Note  Clinical Education officer, museum contacted patient by phone to assess needs.  Patient stated she wished she was feeling better.  She has been encouraged to eat more.  She stated she has been approved for [employer] transportation.  She has not received confirmation from the other agencies that she applied for.  CSW to follow up.  She is still interested in an Assisted Living facility, but is concerned about the cost.  Next meeting is scheduled for August 17th when she sees her MD.  She expressed no other needs at this time.    Rodman Pickle Fiona Coto, LCSW

## 2022-05-03 ENCOUNTER — Other Ambulatory Visit (HOSPITAL_COMMUNITY): Payer: Self-pay

## 2022-05-08 ENCOUNTER — Inpatient Hospital Stay (HOSPITAL_BASED_OUTPATIENT_CLINIC_OR_DEPARTMENT_OTHER): Payer: BC Managed Care – PPO

## 2022-05-08 ENCOUNTER — Inpatient Hospital Stay: Payer: BC Managed Care – PPO

## 2022-05-08 ENCOUNTER — Inpatient Hospital Stay: Payer: BC Managed Care – PPO | Admitting: Hematology and Oncology

## 2022-05-08 ENCOUNTER — Other Ambulatory Visit: Payer: Self-pay

## 2022-05-08 ENCOUNTER — Encounter: Payer: Self-pay | Admitting: Hematology and Oncology

## 2022-05-08 DIAGNOSIS — E440 Moderate protein-calorie malnutrition: Secondary | ICD-10-CM | POA: Diagnosis not present

## 2022-05-08 DIAGNOSIS — D61810 Antineoplastic chemotherapy induced pancytopenia: Secondary | ICD-10-CM | POA: Diagnosis not present

## 2022-05-08 DIAGNOSIS — G893 Neoplasm related pain (acute) (chronic): Secondary | ICD-10-CM

## 2022-05-08 DIAGNOSIS — C50911 Malignant neoplasm of unspecified site of right female breast: Secondary | ICD-10-CM

## 2022-05-08 DIAGNOSIS — Z79811 Long term (current) use of aromatase inhibitors: Secondary | ICD-10-CM | POA: Diagnosis not present

## 2022-05-08 DIAGNOSIS — C7951 Secondary malignant neoplasm of bone: Secondary | ICD-10-CM

## 2022-05-08 DIAGNOSIS — Z170 Estrogen receptor positive status [ER+]: Secondary | ICD-10-CM | POA: Diagnosis not present

## 2022-05-08 DIAGNOSIS — D61818 Other pancytopenia: Secondary | ICD-10-CM

## 2022-05-08 DIAGNOSIS — J90 Pleural effusion, not elsewhere classified: Secondary | ICD-10-CM

## 2022-05-08 DIAGNOSIS — K5909 Other constipation: Secondary | ICD-10-CM | POA: Diagnosis not present

## 2022-05-08 DIAGNOSIS — Z79899 Other long term (current) drug therapy: Secondary | ICD-10-CM | POA: Diagnosis not present

## 2022-05-08 DIAGNOSIS — T148XXD Other injury of unspecified body region, subsequent encounter: Secondary | ICD-10-CM

## 2022-05-08 DIAGNOSIS — D509 Iron deficiency anemia, unspecified: Secondary | ICD-10-CM

## 2022-05-08 LAB — COMPREHENSIVE METABOLIC PANEL WITH GFR
ALT: 9 U/L (ref 0–44)
AST: 14 U/L — ABNORMAL LOW (ref 15–41)
Albumin: 3 g/dL — ABNORMAL LOW (ref 3.5–5.0)
Alkaline Phosphatase: 76 U/L (ref 38–126)
Anion gap: 4 — ABNORMAL LOW (ref 5–15)
BUN: 10 mg/dL (ref 8–23)
CO2: 29 mmol/L (ref 22–32)
Calcium: 8 mg/dL — ABNORMAL LOW (ref 8.9–10.3)
Chloride: 106 mmol/L (ref 98–111)
Creatinine, Ser: 0.93 mg/dL (ref 0.44–1.00)
GFR, Estimated: >60 mL/min
Glucose, Bld: 101 mg/dL — ABNORMAL HIGH (ref 70–99)
Potassium: 3.9 mmol/L (ref 3.5–5.1)
Sodium: 139 mmol/L (ref 135–145)
Total Bilirubin: 0.3 mg/dL (ref 0.3–1.2)
Total Protein: 6.8 g/dL (ref 6.5–8.1)

## 2022-05-08 LAB — CBC WITH DIFFERENTIAL/PLATELET
Abs Immature Granulocytes: 0 10*3/uL (ref 0.00–0.07)
Basophils Absolute: 0 10*3/uL (ref 0.0–0.1)
Basophils Relative: 1 %
Eosinophils Absolute: 0.1 10*3/uL (ref 0.0–0.5)
Eosinophils Relative: 2 %
HCT: 27.7 % — ABNORMAL LOW (ref 36.0–46.0)
Hemoglobin: 8.9 g/dL — ABNORMAL LOW (ref 12.0–15.0)
Immature Granulocytes: 0 %
Lymphocytes Relative: 39 %
Lymphs Abs: 1 10*3/uL (ref 0.7–4.0)
MCH: 23.5 pg — ABNORMAL LOW (ref 26.0–34.0)
MCHC: 32.1 g/dL (ref 30.0–36.0)
MCV: 73.3 fL — ABNORMAL LOW (ref 80.0–100.0)
Monocytes Absolute: 0.2 10*3/uL (ref 0.1–1.0)
Monocytes Relative: 7 %
Neutro Abs: 1.3 10*3/uL — ABNORMAL LOW (ref 1.7–7.7)
Neutrophils Relative %: 51 %
Platelets: 344 10*3/uL (ref 150–400)
RBC: 3.78 MIL/uL — ABNORMAL LOW (ref 3.87–5.11)
RDW: 24.7 % — ABNORMAL HIGH (ref 11.5–15.5)
WBC: 2.5 10*3/uL — ABNORMAL LOW (ref 4.0–10.5)
nRBC: 0 % (ref 0.0–0.2)

## 2022-05-08 LAB — SAMPLE TO BLOOD BANK

## 2022-05-08 NOTE — Assessment & Plan Note (Signed)
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

## 2022-05-08 NOTE — Assessment & Plan Note (Signed)
She has persistent reduced breath sounds on both lungs likely due to persistent pleural effusion Her oxygen saturation is good She does not need thoracentesis today

## 2022-05-08 NOTE — Assessment & Plan Note (Signed)
She continues to have weight loss I recommend the patient to document oral intake She will continue frequent small meals

## 2022-05-08 NOTE — Assessment & Plan Note (Signed)
She is reminded to take vitamin D and calcium She has obtained dental clearance She will continue Xgeva every 3 months, next due in October

## 2022-05-08 NOTE — Assessment & Plan Note (Signed)
Her cancer associated pain is well-controlled She will continue prescribed hydromorphone

## 2022-05-08 NOTE — Assessment & Plan Note (Signed)
Overall, she is responding to treatment clinically Her swelling on the right upper extremity is less The size of her breast mass on the right has reduce by a third Her nutritional status has improved She will continue abemaciclib in combination with anastrozole I plan to repeat imaging study after approximately 3 months of treatment, around end of October

## 2022-05-08 NOTE — Progress Notes (Signed)
[place] OFFICE PROGRESS NOTE  Patient Care Team: Heath Lark, MD as PCP - General (Hematology and Oncology)  ASSESSMENT & PLAN:  Primary breast malignancy ([place]) Overall, she is responding to treatment clinically Her swelling on the right upper extremity is less The size of her breast mass on the right has reduce by a third Her nutritional status has improved She will continue abemaciclib in combination with anastrozole I plan to repeat imaging study after approximately 3 months of treatment, around end of October  Breast cancer metastasized to bone, right ([place]) She is reminded to take vitamin D and calcium She has obtained dental clearance She will continue Xgeva every 3 months, next due in October  Pancytopenia, acquired [facility]) This is multifactorial, related to her chemotherapy and recent bleeding She does not need transfusion support today  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 She will continue prescribed hydromorphone  Other constipation I reminded her again the importance of taking regular laxatives  Malnutrition of moderate degree She continues to have weight loss I recommend the patient to document oral intake She will continue frequent small meals  Pleural effusion She has persistent reduced breath sounds on both lungs likely due to persistent pleural effusion Her oxygen saturation is good She does not need thoracentesis today  Wound healing, delayed She has necrotic chest wall on the right side, much improved with signs of healthy granulation tissue I reassured the patient that she is responding to treatment She will continue wound dressing changes daily  No orders of the defined types were placed in this encounter.   All questions were answered. The patient knows to call the clinic with any problems, questions or concerns. The total time spent in the appointment was 55 minutes encounter with patients  including review of chart and various tests results, discussions about plan of care and coordination of care plan   Heath Lark, MD 05/08/2022 4:03 PM  INTERVAL HISTORY: Please see below for problem oriented charting. she returns for treatment follow-up with her mother for further follow-up She has lost some weight She claims she is eating a lot of food daily She had mild constipation Her chronic pain is stable She is concerned about mild intermittent chronic bleeding from her chest wall  REVIEW OF SYSTEMS:   Constitutional: Denies fevers, chills  Eyes: Denies blurriness of vision Ears, nose, mouth, throat, and face: Denies mucositis or sore throat Respiratory: Denies cough, dyspnea or wheezes Cardiovascular: Denies palpitation, chest discomfort or lower extremity swelling Lymphatics: Denies new lymphadenopathy or easy bruising Behavioral/Psych: Mood is stable, no new changes  All other systems were reviewed with the patient and are negative.  I have reviewed the past medical history, past surgical history, social history and family history with the patient and they are unchanged from previous note.  ALLERGIES:  has No Known Allergies.  MEDICATIONS:  Current Outpatient Medications  Medication Sig Dispense Refill   abemaciclib (VERZENIO) 100 MG tablet Take 1 tablet (100 mg total) by mouth 2 (two) times daily. 56 tablet 11   anastrozole (ARIMIDEX) 1 MG tablet Take 1 tablet (1 mg total) by mouth daily. 30 tablet 11   calcium carbonate (TUMS - DOSED IN MG ELEMENTAL CALCIUM) 500 MG chewable tablet Chew 1 tablet by mouth 2 (two) times daily.     cholecalciferol (VITAMIN D3) 25 MCG (1000 UNIT) tablet Take 2,000 Units by mouth daily.     HYDROmorphone (DILAUDID) 8 MG tablet Take 1 tablet (8 mg  total) by mouth every 4 (four) hours as needed for moderate pain. 60 tablet 0   metroNIDAZOLE (METROGEL) 1 % gel Apply topically daily. 60 g 0   polyethylene glycol (MIRALAX / GLYCOLAX) 17 g packet  Take 17 g by mouth daily.     senna (SENOKOT) 8.6 MG tablet Take 2 tablets by mouth 2 (two) times daily.     No current facility-administered medications for this visit.    SUMMARY OF ONCOLOGIC HISTORY: Oncology History Overview Note  The tumor cells are EQUIVOCAL For Her2 (2+).   Estrogen Receptor:       POSITIVE, 100%, STRONG STAINING  Progesterone Receptor:   NEGATIVE (0)  Proliferation Marker Ki-67:   45%    Primary breast malignancy ([place])  03/22/2022 Initial Diagnosis   Primary breast malignancy ([place])   03/24/2022 Pathology Results   SURGICAL PATHOLOGY  CASE: [id number]  PATIENT: [name]  Surgical Pathology Report   Clinical History: Ulcerative right breast lesion and probable metastatic breast cancer ([facility])   FINAL MICROSCOPIC DIAGNOSIS:   A. SOFT TISSUE MASS, RIGHT UPPER CHEST WALL, NEEDLE CORE BIOPSY:  - Invasive ductal carcinoma, see comment  - Ductal carcinoma in situ: Not identified   - Tubule formation: Score 3  - Nuclear pleomorphism: Score 3  - Mitotic count: Score 3  - Total score: 9  - Overall Grade: 3  - Lymphovascular invasion: Not identified  - Cancer Length: 13 mm  - Calcifications: Not identified  - Other findings: None    03/28/2022 Cancer Staging   Staging form: Breast, AJCC 8th Edition - Clinical stage from 03/28/2022: Stage IV (cT4d, cN3c, pM1, G3, ER+, PR-, HER2: Equivocal) - Signed by Heath Lark, MD on 03/28/2022 Stage prefix: Initial diagnosis Nuclear grade: G3 Histologic grading system: 3 grade system   04/17/2022 -  Chemotherapy   She started taking combination of abemaciclib with anastrozole     PHYSICAL EXAMINATION: ECOG PERFORMANCE STATUS: 2 - Symptomatic, <50% confined to bed  Vitals:   05/08/22 1359  BP: 108/64  Pulse: (!) 114  Resp: 18  SpO2: 100%   Filed Weights   05/08/22 1359  Weight: 134 lb 3.2 oz (60.9 kg)    GENERAL:alert, no distress and comfortable SKIN: She has persistent necrotic wound on the  right side but much improved and reduced in size compared to previous visit EYES: normal, Conjunctiva are pink and non-injected, sclera clear OROPHARYNX:no exudate, no erythema and lips, buccal mucosa, and tongue normal  NECK: supple, thyroid normal size, non-tender, without nodularity LYMPH:  no palpable lymphadenopathy in the cervical, axillary or inguinal LUNGS: She has reduced breath sound bilateral lung bases HEART: she has tachycardia, no murmurs lower extremity edema ABDOMEN:abdomen soft, non-tender and normal bowel sounds Musculoskeletal:no cyanosis of digits and no clubbing.  She has lymphedema on the right arm, slightly improved NEURO: alert & oriented x 3 with fluent speech, no focal motor/sensory deficits  LABORATORY DATA:  I have reviewed the data as listed    Component Value Date/Time   NA 139 05/08/2022 1336   K 3.9 05/08/2022 1336   CL 106 05/08/2022 1336   CO2 29 05/08/2022 1336   GLUCOSE 101 (H) 05/08/2022 1336   BUN 10 05/08/2022 1336   CREATININE 0.93 05/08/2022 1336   CALCIUM 8.0 (L) 05/08/2022 1336   PROT 6.8 05/08/2022 1336   ALBUMIN 3.0 (L) 05/08/2022 1336   AST 14 (L) 05/08/2022 1336   ALT 9 05/08/2022 1336   ALKPHOS 76 05/08/2022 1336  BILITOT 0.3 05/08/2022 1336   GFRNONAA >60 05/08/2022 1336    No results found for: "SPEP", "UPEP"  Lab Results  Component Value Date   WBC 2.5 (L) 05/08/2022   NEUTROABS 1.3 (L) 05/08/2022   HGB 8.9 (L) 05/08/2022   HCT 27.7 (L) 05/08/2022   MCV 73.3 (L) 05/08/2022   PLT 344 05/08/2022      Chemistry      Component Value Date/Time   NA 139 05/08/2022 1336   K 3.9 05/08/2022 1336   CL 106 05/08/2022 1336   CO2 29 05/08/2022 1336   BUN 10 05/08/2022 1336   CREATININE 0.93 05/08/2022 1336      Component Value Date/Time   CALCIUM 8.0 (L) 05/08/2022 1336   ALKPHOS 76 05/08/2022 1336   AST 14 (L) 05/08/2022 1336   ALT 9 05/08/2022 1336   BILITOT 0.3 05/08/2022 1336       RADIOGRAPHIC STUDIES: I have  personally reviewed the radiological images as listed and agreed with the findings in the report. IR THORACENTESIS ASP PLEURAL SPACE W/IMG GUIDE  Result Date: 04/10/2022 INDICATION: Patient with a history of right breast cancer. Patient found to have large right pleural effusion. Request for therapeutic right thoracentesis. EXAM: ULTRASOUND GUIDED THERAPEUTIC RIGHT THORACENTESIS MEDICATIONS: 10 mL 1 % lidocaine COMPLICATIONS: None immediate. PROCEDURE: An ultrasound guided thoracentesis was thoroughly discussed with the patient and questions answered. The benefits, risks, alternatives and complications were also discussed. The patient understands and wishes to proceed with the procedure. Written consent was obtained. Ultrasound was performed to localize and mark an adequate pocket of fluid in the right chest. The area was then prepped and draped in the normal sterile fashion. 1% Lidocaine was used for local anesthesia. Under ultrasound guidance a 6 Fr Safe-T-Centesis catheter was introduced. Thoracentesis was performed. The catheter was removed and a dressing applied. FINDINGS: A total of approximately 1.7 L of clear, yellow fluid was removed. IMPRESSION: Successful ultrasound guided right thoracentesis yielding 1.7 L of pleural fluid. Read by: Narda Rutherford, AGNP-BC Electronically Signed   By: Aletta Edouard M.D.   On: 04/10/2022 10:47   DG Chest 1 View  Result Date: 04/10/2022 CLINICAL DATA:  Status post right-sided thoracentesis. EXAM: CHEST  1 VIEW COMPARISON:  Chest x-ray 04/08/2022 FINDINGS: Interval evacuation of the large right pleural effusion. Small residual pleural effusion and right basilar atelectasis. No postprocedural pneumothorax. The left lung remains clear. IMPRESSION: Status post right-sided thoracentesis with evacuation of the large right pleural effusion. No postprocedural pneumothorax. Electronically Signed   By: Marijo Sanes M.D.   On: 04/10/2022 09:03

## 2022-05-08 NOTE — Progress Notes (Signed)
[place] CSW Progress Note  Holiday representative met with patient and her mother to update them on referrals that were made.  CSW spoke with Magda Paganini at the [facility] and she stated that it would probably take another four weeks for patient's application to be processed.  Patient stated she has lost a significant amount of weight and agreed to speak with the [facility] dietician.  CSW made referral.  Explored additional resources, including Cancer Services in [place], and the [employer].  Patient received [employer] information previously, but did not pursue it.  CSW provided her with the application again per her request.  Sent another referral to [employer].  Also provided her with information regarding a  home delivery meal program, [employer].  CSW also contacted them regarding the referral process.  Patient and her mother expressed no other concerns at this time.   Rodman Pickle Erminio Nygard, LCSW

## 2022-05-08 NOTE — Assessment & Plan Note (Signed)
I reminded her again the importance of taking regular laxatives

## 2022-05-08 NOTE — Assessment & Plan Note (Signed)
She has necrotic chest wall on the right side, much improved with signs of healthy granulation tissue I reassured the patient that she is responding to treatment She will continue wound dressing changes daily

## 2022-05-12 ENCOUNTER — Ambulatory Visit: Payer: BC Managed Care – PPO | Admitting: Physical Therapy

## 2022-05-16 ENCOUNTER — Inpatient Hospital Stay: Payer: BC Managed Care – PPO

## 2022-05-16 NOTE — Progress Notes (Signed)
[place] CSW Progress Note  Clinical Education officer, museum contacted patient by phone to assess psychosocial needs.  Patient denied any discomfort and reports feeling "fine".  Discussed the status of various grants she is in the process of applying for.  Patient and CSW to meet before her appointment with Dr. Alvy Bimler on Monday, 05/19/22 to complete her application for the [employer].  No other needs expressed at this time.    Rodman Pickle Gagandeep Pettet, LCSW

## 2022-05-19 ENCOUNTER — Telehealth: Payer: Self-pay

## 2022-05-19 ENCOUNTER — Telehealth: Payer: Self-pay | Admitting: Hematology and Oncology

## 2022-05-19 ENCOUNTER — Inpatient Hospital Stay: Payer: BC Managed Care – PPO | Admitting: Hematology and Oncology

## 2022-05-19 ENCOUNTER — Inpatient Hospital Stay: Payer: BC Managed Care – PPO

## 2022-05-19 NOTE — Telephone Encounter (Signed)
Per 8/21 phone line pt called to r/s appointment with MD and she will r/s the social work appointment at a later time

## 2022-05-19 NOTE — Telephone Encounter (Signed)
[name], Audrina's Mom called and left a message. Carra canceled the appts due to being tired. [name] wanted her to keep the appt but Javonne would not listen. She ask that the office not tell Dreanna that she called.  Yvone is not taking the appetite stimulant. [name] just wanted Dr. Alvy Bimler to be aware.

## 2022-05-22 ENCOUNTER — Telehealth: Payer: Self-pay

## 2022-05-22 NOTE — Telephone Encounter (Signed)
Returned her call. She has a cancer policy that she needs assistance filling out. She will dropped the form off at the office or bring it with her at the next appt on 9/1. She will call the office back for further questions.

## 2022-05-23 ENCOUNTER — Inpatient Hospital Stay: Payer: BC Managed Care – PPO

## 2022-05-23 NOTE — Progress Notes (Signed)
[place] CSW Progress Note  Clinical Education officer, museum contacted patient by phone to assess needs.  Patient reported feeling better today.  She has not received any funds from the [employer].  On 05/14/22, CSW faxed a request for pads and chucks to Cancer Services in [place].  CSW also spoke with Dr. Calton Dach RN about supplies.  CSW to leave a case of Ensure at reception desk at [facility] for pick up on 05/30/22.  Patient expressed no other needs.  Rodman Pickle Adetokunbo Mccadden, LCSW

## 2022-05-26 ENCOUNTER — Telehealth: Payer: Self-pay

## 2022-05-26 NOTE — Telephone Encounter (Signed)
Called regarding after hours call yesterday for sore throat and possible fever. Spoke with Caren Griffins. Denies sore today and no fever. She did not go to urgent care because she felt better. She will call the office back for worsening symptoms or questions. Reviewed upcoming appts.

## 2022-05-28 ENCOUNTER — Other Ambulatory Visit (HOSPITAL_COMMUNITY): Payer: Self-pay

## 2022-05-30 ENCOUNTER — Inpatient Hospital Stay: Payer: BC Managed Care – PPO | Attending: Hematology and Oncology

## 2022-05-30 ENCOUNTER — Other Ambulatory Visit (HOSPITAL_COMMUNITY): Payer: Self-pay

## 2022-05-30 ENCOUNTER — Other Ambulatory Visit: Payer: Self-pay

## 2022-05-30 ENCOUNTER — Inpatient Hospital Stay: Payer: BC Managed Care – PPO | Admitting: Hematology and Oncology

## 2022-05-30 ENCOUNTER — Encounter: Payer: Self-pay | Admitting: Hematology and Oncology

## 2022-05-30 DIAGNOSIS — E440 Moderate protein-calorie malnutrition: Secondary | ICD-10-CM | POA: Insufficient documentation

## 2022-05-30 DIAGNOSIS — G893 Neoplasm related pain (acute) (chronic): Secondary | ICD-10-CM | POA: Insufficient documentation

## 2022-05-30 DIAGNOSIS — R634 Abnormal weight loss: Secondary | ICD-10-CM | POA: Diagnosis not present

## 2022-05-30 DIAGNOSIS — C50911 Malignant neoplasm of unspecified site of right female breast: Secondary | ICD-10-CM | POA: Diagnosis present

## 2022-05-30 DIAGNOSIS — Z79899 Other long term (current) drug therapy: Secondary | ICD-10-CM | POA: Diagnosis not present

## 2022-05-30 DIAGNOSIS — J90 Pleural effusion, not elsewhere classified: Secondary | ICD-10-CM

## 2022-05-30 DIAGNOSIS — K5909 Other constipation: Secondary | ICD-10-CM | POA: Insufficient documentation

## 2022-05-30 DIAGNOSIS — D509 Iron deficiency anemia, unspecified: Secondary | ICD-10-CM

## 2022-05-30 DIAGNOSIS — K219 Gastro-esophageal reflux disease without esophagitis: Secondary | ICD-10-CM

## 2022-05-30 DIAGNOSIS — F4321 Adjustment disorder with depressed mood: Secondary | ICD-10-CM

## 2022-05-30 DIAGNOSIS — C7951 Secondary malignant neoplasm of bone: Secondary | ICD-10-CM | POA: Insufficient documentation

## 2022-05-30 LAB — COMPREHENSIVE METABOLIC PANEL
ALT: 8 U/L (ref 0–44)
AST: 17 U/L (ref 15–41)
Albumin: 3.5 g/dL (ref 3.5–5.0)
Alkaline Phosphatase: 70 U/L (ref 38–126)
Anion gap: 3 — ABNORMAL LOW (ref 5–15)
BUN: 13 mg/dL (ref 8–23)
CO2: 31 mmol/L (ref 22–32)
Calcium: 9.6 mg/dL (ref 8.9–10.3)
Chloride: 106 mmol/L (ref 98–111)
Creatinine, Ser: 0.99 mg/dL (ref 0.44–1.00)
GFR, Estimated: >60 mL/min (ref >60)
Glucose, Bld: 79 mg/dL (ref 70–99)
Potassium: 4.1 mmol/L (ref 3.5–5.1)
Sodium: 140 mmol/L (ref 135–145)
Total Bilirubin: 0.4 mg/dL (ref 0.3–1.2)
Total Protein: 7.3 g/dL (ref 6.5–8.1)

## 2022-05-30 LAB — CBC WITH DIFFERENTIAL/PLATELET
Abs Immature Granulocytes: 0 10*3/uL (ref 0.00–0.07)
Basophils Absolute: 0.1 10*3/uL (ref 0.0–0.1)
Basophils Relative: 3 %
Eosinophils Absolute: 0.1 10*3/uL (ref 0.0–0.5)
Eosinophils Relative: 2 %
HCT: 29.5 % — ABNORMAL LOW (ref 36.0–46.0)
Hemoglobin: 9.4 g/dL — ABNORMAL LOW (ref 12.0–15.0)
Immature Granulocytes: 0 %
Lymphocytes Relative: 53 %
Lymphs Abs: 1.5 10*3/uL (ref 0.7–4.0)
MCH: 23.9 pg — ABNORMAL LOW (ref 26.0–34.0)
MCHC: 31.9 g/dL (ref 30.0–36.0)
MCV: 74.9 fL — ABNORMAL LOW (ref 80.0–100.0)
Monocytes Absolute: 0.1 10*3/uL (ref 0.1–1.0)
Monocytes Relative: 5 %
Neutro Abs: 1.1 10*3/uL — ABNORMAL LOW (ref 1.7–7.7)
Neutrophils Relative %: 37 %
Platelets: 411 10*3/uL — ABNORMAL HIGH (ref 150–400)
RBC: 3.94 MIL/uL (ref 3.87–5.11)
RDW: 27.7 % — ABNORMAL HIGH (ref 11.5–15.5)
WBC: 2.8 10*3/uL — ABNORMAL LOW (ref 4.0–10.5)
nRBC: 0 % (ref 0.0–0.2)

## 2022-05-30 LAB — SAMPLE TO BLOOD BANK

## 2022-05-30 MED ORDER — FAMOTIDINE 20 MG PO TABS
20.0000 mg | ORAL_TABLET | Freq: Two times a day (BID) | ORAL | 2 refills | Status: DC
  Filled 2022-05-30: qty 60, 30d supply, fill #0
  Filled 2022-10-30 (×2): qty 60, 30d supply, fill #1

## 2022-05-30 MED ORDER — HYDROMORPHONE HCL 8 MG PO TABS
8.0000 mg | ORAL_TABLET | Freq: Four times a day (QID) | ORAL | 0 refills | Status: DC | PRN
  Filled 2022-05-30: qty 90, 23d supply, fill #0

## 2022-05-30 MED ORDER — MIRTAZAPINE 7.5 MG PO TABS
7.5000 mg | ORAL_TABLET | Freq: Every day | ORAL | 1 refills | Status: DC
  Filled 2022-05-30: qty 30, 30d supply, fill #0

## 2022-05-30 NOTE — Assessment & Plan Note (Signed)
She has anemia chronic illness as well as mild iron deficiency Due to her predisposition to severe constipation, we are avoiding oral iron supplement but focus on dietary changes

## 2022-05-30 NOTE — Assessment & Plan Note (Signed)
She is somewhat depressed but not suicidal In addition to starting her on Remeron, I recommend referral to see behavioral health and she is in agreement

## 2022-05-30 NOTE — Progress Notes (Signed)
[name] OFFICE PROGRESS NOTE  Patient Care Team: Heath Lark, MD as PCP - General (Hematology and Oncology)  ASSESSMENT & PLAN:  Primary breast malignancy ([place]) Overall, she is responding to treatment clinically Her swelling on the right upper extremity is less The size of her breast mass on the right has reduced in size further with healthy granulation tissue Her nutritional status has improved She will continue abemaciclib in combination with anastrozole I plan to repeat imaging study after approximately 3 months of treatment, around end of October She will receive Xgeva every 3 months  Breast cancer metastasized to bone, right ([place]) She is reminded to take vitamin D and calcium She has obtained dental clearance She will continue Xgeva every 3 months, next due in October  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 She will continue prescribed hydromorphone  Malnutrition of moderate degree Serum albumin started to improve but she continues to have some mild weight loss I recommend starting her on low-dose mirtazapine that should also help with her coping We discussed importance of adding additional nutritional supplement  Microcytic anemia She has anemia chronic illness as well as mild iron deficiency Due to her predisposition to severe constipation, we are avoiding oral iron supplement but focus on dietary changes  Other constipation With daily laxative on MiraLAX, she is only having bowel movement every 3 days She was not taking Senokot as prescribed I reminded her to take Senokot and MiraLAX daily  Pleural effusion She has persistent signs of pleural effusion on the right but with normal oxygen saturation I do not feel strongly she needs to have a thoracentesis right now  Situational depression She is somewhat depressed but not suicidal In addition to starting her on Remeron, I recommend referral to see behavioral health and  she is in agreement  Orders Placed This Encounter  Procedures   Ambulatory referral to Psychology    Referral Priority:   Routine    Referral Type:   Psychiatric    Referral Reason:   Specialty Services Required    Referred to Provider:   Conception Chancy, PsyD    Requested Specialty:   Psychology    Number of Visits Requested:   1    All questions were answered. The patient knows to call the clinic with any problems, questions or concerns. The total time spent in the appointment was 60 minutes encounter with patients including review of chart and various tests results, discussions about plan of care and coordination of care plan   Heath Lark, MD 05/30/2022 3:15 PM  INTERVAL HISTORY: Please see below for problem oriented charting. she returns for treatment follow-up with her sister Her mother wrote me a letter which I have reviewed the content I have reviewed her documented flowsheet related to her oral intake She is eating 3 meals a day along with some nutritional supplement in between However, she have lost almost 5 pounds since I saw her 3 weeks ago She missed her appointment recently and had to be rescheduled She is still dealing with chronic constipation with bowel movement every 3 days She is taking her prescribed pain medicine on average twice daily with reasonable pain control She complained of some symptoms of reflux but no nausea vomiting She thought that her wound is better.  There is less drainage emanating from her right chest wall.  Her right arm swelling has improved She denies recent bleeding.  She acknowledged that her mood changed recently and occasionally she  felt sad.  REVIEW OF SYSTEMS:   Constitutional: Denies fevers, chills  Eyes: Denies blurriness of vision Ears, nose, mouth, throat, and face: Denies mucositis or sore throat Cardiovascular: Denies palpitation, chest discomfort or lower extremity swelling Skin: Denies abnormal skin rashes Lymphatics:  Denies new lymphadenopathy or easy bruising Neurological:Denies numbness, tingling or new weaknesses All other systems were reviewed with the patient and are negative.  I have reviewed the past medical history, past surgical history, social history and family history with the patient and they are unchanged from previous note.  ALLERGIES:  has No Known Allergies.  MEDICATIONS:  Current Outpatient Medications  Medication Sig Dispense Refill   famotidine (PEPCID) 20 MG tablet Take 1 tablet (20 mg total) by mouth 2 (two) times daily. 60 tablet 2   mirtazapine (REMERON) 7.5 MG tablet Take 1 tablet (7.5 mg total) by mouth at bedtime. 30 tablet 1   abemaciclib (VERZENIO) 100 MG tablet Take 1 tablet (100 mg total) by mouth 2 (two) times daily. 56 tablet 11   anastrozole (ARIMIDEX) 1 MG tablet Take 1 tablet (1 mg total) by mouth daily. 30 tablet 11   calcium carbonate (TUMS - DOSED IN MG ELEMENTAL CALCIUM) 500 MG chewable tablet Chew 1 tablet by mouth 2 (two) times daily.     cholecalciferol (VITAMIN D3) 25 MCG (1000 UNIT) tablet Take 2,000 Units by mouth daily.     HYDROmorphone (DILAUDID) 8 MG tablet Take 1 tablet (8 mg total) by mouth every 6 (six) hours as needed for moderate pain. 90 tablet 0   metroNIDAZOLE (METROGEL) 1 % gel Apply topically daily. 60 g 0   polyethylene glycol (MIRALAX / GLYCOLAX) 17 g packet Take 17 g by mouth daily.     senna (SENOKOT) 8.6 MG tablet Take 2 tablets by mouth 2 (two) times daily.     No current facility-administered medications for this visit.    SUMMARY OF ONCOLOGIC HISTORY: Oncology History Overview Note  The tumor cells are EQUIVOCAL For Her2 (2+).   Estrogen Receptor:       POSITIVE, 100%, STRONG STAINING  Progesterone Receptor:   NEGATIVE (0)  Proliferation Marker Ki-67:   45%    Primary breast malignancy ([place])  03/22/2022 Initial Diagnosis   Primary breast malignancy ([place])   03/24/2022 Pathology Results   SURGICAL PATHOLOGY  CASE: [phone]   PATIENT: [name]  Surgical Pathology Report   Clinical History: Ulcerative right breast lesion and probable metastatic breast cancer ([facility])   FINAL MICROSCOPIC DIAGNOSIS:   A. SOFT TISSUE MASS, RIGHT UPPER CHEST WALL, NEEDLE CORE BIOPSY:  - Invasive ductal carcinoma, see comment  - Ductal carcinoma in situ: Not identified   - Tubule formation: Score 3  - Nuclear pleomorphism: Score 3  - Mitotic count: Score 3  - Total score: 9  - Overall Grade: 3  - Lymphovascular invasion: Not identified  - Cancer Length: 13 mm  - Calcifications: Not identified  - Other findings: None    03/28/2022 Cancer Staging   Staging form: Breast, AJCC 8th Edition - Clinical stage from 03/28/2022: Stage IV (cT4d, cN3c, pM1, G3, ER+, PR-, HER2: Equivocal) - Signed by Heath Lark, MD on 03/28/2022 Stage prefix: Initial diagnosis Nuclear grade: G3 Histologic grading system: 3 grade system   04/17/2022 -  Chemotherapy   She started taking combination of abemaciclib with anastrozole     PHYSICAL EXAMINATION: ECOG PERFORMANCE STATUS: 2 - Symptomatic, <50% confined to bed  Vitals:   05/30/22 0926  BP: 133/75  Pulse: 98  Resp: 18  Temp: 97.7 F (36.5 C)  SpO2: 100%   Filed Weights   05/30/22 0926  Weight: 129 lb 3.2 oz (58.6 kg)    GENERAL:alert, no distress and comfortable.  She looks thin and cachectic SKIN: skin color, texture, turgor are normal, no rashes or significant lesions EYES: normal, Conjunctiva are pink and non-injected, sclera clear OROPHARYNX:no exudate, no erythema and lips, buccal mucosa, and tongue normal  NECK: supple, thyroid normal size, non-tender, without nodularity LYMPH:  no palpable lymphadenopathy in the cervical, axillary or inguinal LUNGS: clear to auscultation and percussion with normal breathing effort HEART: regular rate & rhythm and no murmurs and no lower extremity edema.  The right arm lymphedema is improving ABDOMEN:abdomen soft, non-tender and normal  bowel sounds Musculoskeletal: Close examination of her chest wall reveals well-healing granulation tissue on the right chest wall.  There are discharge/drainage she is improving NEURO: alert & oriented x 3 with fluent speech, no focal motor/sensory deficits  LABORATORY DATA:  I have reviewed the data as listed    Component Value Date/Time   NA 140 05/30/2022 0913   K 4.1 05/30/2022 0913   CL 106 05/30/2022 0913   CO2 31 05/30/2022 0913   GLUCOSE 79 05/30/2022 0913   BUN 13 05/30/2022 0913   CREATININE 0.99 05/30/2022 0913   CALCIUM 9.6 05/30/2022 0913   PROT 7.3 05/30/2022 0913   ALBUMIN 3.5 05/30/2022 0913   AST 17 05/30/2022 0913   ALT 8 05/30/2022 0913   ALKPHOS 70 05/30/2022 0913   BILITOT 0.4 05/30/2022 0913   GFRNONAA >60 05/30/2022 0913    No results found for: "SPEP", "UPEP"  Lab Results  Component Value Date   WBC 2.8 (L) 05/30/2022   NEUTROABS 1.1 (L) 05/30/2022   HGB 9.4 (L) 05/30/2022   HCT 29.5 (L) 05/30/2022   MCV 74.9 (L) 05/30/2022   PLT 411 (H) 05/30/2022      Chemistry      Component Value Date/Time   NA 140 05/30/2022 0913   K 4.1 05/30/2022 0913   CL 106 05/30/2022 0913   CO2 31 05/30/2022 0913   BUN 13 05/30/2022 0913   CREATININE 0.99 05/30/2022 0913      Component Value Date/Time   CALCIUM 9.6 05/30/2022 0913   ALKPHOS 70 05/30/2022 0913   AST 17 05/30/2022 0913   ALT 8 05/30/2022 0913   BILITOT 0.4 05/30/2022 0913

## 2022-05-30 NOTE — Assessment & Plan Note (Signed)
With daily laxative on MiraLAX, she is only having bowel movement every 3 days She was not taking Senokot as prescribed I reminded her to take Senokot and MiraLAX daily

## 2022-05-30 NOTE — Assessment & Plan Note (Signed)
Overall, she is responding to treatment clinically Her swelling on the right upper extremity is less The size of her breast mass on the right has reduced in size further with healthy granulation tissue Her nutritional status has improved She will continue abemaciclib in combination with anastrozole I plan to repeat imaging study after approximately 3 months of treatment, around end of October She will receive Xgeva every 3 months

## 2022-05-30 NOTE — Assessment & Plan Note (Signed)
Her cancer associated pain is well-controlled She will continue prescribed hydromorphone

## 2022-05-30 NOTE — Assessment & Plan Note (Signed)
She has persistent signs of pleural effusion on the right but with normal oxygen saturation I do not feel strongly she needs to have a thoracentesis right now

## 2022-05-30 NOTE — Assessment & Plan Note (Signed)
Serum albumin started to improve but she continues to have some mild weight loss I recommend starting her on low-dose mirtazapine that should also help with her coping We discussed importance of adding additional nutritional supplement

## 2022-05-30 NOTE — Assessment & Plan Note (Signed)
She is reminded to take vitamin D and calcium She has obtained dental clearance She will continue Xgeva every 3 months, next due in October

## 2022-05-30 NOTE — Assessment & Plan Note (Signed)
I recommend a trial of Pepcid twice daily

## 2022-06-06 ENCOUNTER — Other Ambulatory Visit (HOSPITAL_COMMUNITY): Payer: Self-pay

## 2022-06-12 ENCOUNTER — Other Ambulatory Visit: Payer: Self-pay | Admitting: Hematology and Oncology

## 2022-06-12 ENCOUNTER — Telehealth: Payer: Self-pay

## 2022-06-12 ENCOUNTER — Other Ambulatory Visit (HOSPITAL_COMMUNITY): Payer: Self-pay

## 2022-06-12 ENCOUNTER — Inpatient Hospital Stay (HOSPITAL_BASED_OUTPATIENT_CLINIC_OR_DEPARTMENT_OTHER): Payer: BC Managed Care – PPO | Admitting: Hematology and Oncology

## 2022-06-12 ENCOUNTER — Other Ambulatory Visit: Payer: Self-pay

## 2022-06-12 ENCOUNTER — Inpatient Hospital Stay: Payer: BC Managed Care – PPO

## 2022-06-12 VITALS — BP 105/49 | HR 104 | Temp 98.5°F | Resp 18 | Ht 66.5 in | Wt 128.2 lb

## 2022-06-12 DIAGNOSIS — C50911 Malignant neoplasm of unspecified site of right female breast: Secondary | ICD-10-CM

## 2022-06-12 DIAGNOSIS — E440 Moderate protein-calorie malnutrition: Secondary | ICD-10-CM

## 2022-06-12 DIAGNOSIS — J90 Pleural effusion, not elsewhere classified: Secondary | ICD-10-CM

## 2022-06-12 DIAGNOSIS — E559 Vitamin D deficiency, unspecified: Secondary | ICD-10-CM

## 2022-06-12 DIAGNOSIS — C7951 Secondary malignant neoplasm of bone: Secondary | ICD-10-CM

## 2022-06-12 DIAGNOSIS — G893 Neoplasm related pain (acute) (chronic): Secondary | ICD-10-CM | POA: Diagnosis not present

## 2022-06-12 DIAGNOSIS — D509 Iron deficiency anemia, unspecified: Secondary | ICD-10-CM

## 2022-06-12 LAB — CBC WITH DIFFERENTIAL/PLATELET
Abs Immature Granulocytes: 0.01 10*3/uL (ref 0.00–0.07)
Basophils Absolute: 0.1 10*3/uL (ref 0.0–0.1)
Basophils Relative: 3 %
Eosinophils Absolute: 0.1 10*3/uL (ref 0.0–0.5)
Eosinophils Relative: 2 %
HCT: 31.2 % — ABNORMAL LOW (ref 36.0–46.0)
Hemoglobin: 9.6 g/dL — ABNORMAL LOW (ref 12.0–15.0)
Immature Granulocytes: 0 %
Lymphocytes Relative: 35 %
Lymphs Abs: 1 10*3/uL (ref 0.7–4.0)
MCH: 24.6 pg — ABNORMAL LOW (ref 26.0–34.0)
MCHC: 30.8 g/dL (ref 30.0–36.0)
MCV: 80 fL (ref 80.0–100.0)
Monocytes Absolute: 0.2 10*3/uL (ref 0.1–1.0)
Monocytes Relative: 6 %
Neutro Abs: 1.6 10*3/uL — ABNORMAL LOW (ref 1.7–7.7)
Neutrophils Relative %: 54 %
Platelets: 377 10*3/uL (ref 150–400)
RBC: 3.9 MIL/uL (ref 3.87–5.11)
Smear Review: NORMAL
WBC: 2.9 10*3/uL — ABNORMAL LOW (ref 4.0–10.5)
nRBC: 0 % (ref 0.0–0.2)

## 2022-06-12 LAB — COMPREHENSIVE METABOLIC PANEL
ALT: 9 U/L (ref 0–44)
AST: 16 U/L (ref 15–41)
Albumin: 3.4 g/dL — ABNORMAL LOW (ref 3.5–5.0)
Alkaline Phosphatase: 52 U/L (ref 38–126)
Anion gap: 3 — ABNORMAL LOW (ref 5–15)
BUN: 11 mg/dL (ref 8–23)
CO2: 30 mmol/L (ref 22–32)
Calcium: 8.8 mg/dL — ABNORMAL LOW (ref 8.9–10.3)
Chloride: 105 mmol/L (ref 98–111)
Creatinine, Ser: 0.79 mg/dL (ref 0.44–1.00)
GFR, Estimated: >60 mL/min (ref >60)
Glucose, Bld: 115 mg/dL — ABNORMAL HIGH (ref 70–99)
Potassium: 4.1 mmol/L (ref 3.5–5.1)
Sodium: 138 mmol/L (ref 135–145)
Total Bilirubin: 0.3 mg/dL (ref 0.3–1.2)
Total Protein: 6.8 g/dL (ref 6.5–8.1)

## 2022-06-12 LAB — SAMPLE TO BLOOD BANK

## 2022-06-12 MED ORDER — MAGNESIUM OXIDE -MG SUPPLEMENT 400 (240 MG) MG PO TABS
400.0000 mg | ORAL_TABLET | Freq: Every day | ORAL | 3 refills | Status: DC
  Filled 2022-06-12: qty 30, 30d supply, fill #0

## 2022-06-12 NOTE — Telephone Encounter (Signed)
done

## 2022-06-12 NOTE — Telephone Encounter (Signed)
Her sister called and left a message asking if you could order vitamin D on Atha today. Her appt is at 1:30 today.

## 2022-06-13 ENCOUNTER — Encounter: Payer: Self-pay | Admitting: Hematology and Oncology

## 2022-06-13 NOTE — Assessment & Plan Note (Signed)
Overall, she is responding to treatment clinically Her swelling on the right upper extremity is less The size of her breast mass on the right has reduced in size further with healthy granulation tissue Her nutritional status has improved She will continue abemaciclib in combination with anastrozole I plan to repeat imaging study at the end of the month She will receive Xgeva every 3 months

## 2022-06-13 NOTE — Assessment & Plan Note (Signed)
She is reminded to take vitamin D and calcium She has obtained dental clearance She will continue Xgeva every 3 months, next due in October

## 2022-06-13 NOTE — Assessment & Plan Note (Signed)
Her cancer associated pain is well-controlled She will continue prescribed hydromorphone

## 2022-06-13 NOTE — Assessment & Plan Note (Signed)
She has persistent signs of pleural effusion on the right but with normal oxygen saturation I do not feel strongly she needs to have a thoracentesis right now

## 2022-06-13 NOTE — Progress Notes (Signed)
[facility] OFFICE PROGRESS NOTE  Patient Care Team: Heath Lark, MD as PCP - General (Hematology and Oncology)  ASSESSMENT & PLAN:  Primary breast malignancy ([place]) Overall, she is responding to treatment clinically Her swelling on the right upper extremity is less The size of her breast mass on the right has reduced in size further with healthy granulation tissue Her nutritional status has improved She will continue abemaciclib in combination with anastrozole I plan to repeat imaging study at the end of the month She will receive Xgeva every 3 months  Breast cancer metastasized to bone, right ([place]) She is reminded to take vitamin D and calcium She has obtained dental clearance She will continue Xgeva every 3 months, next due in October  Malnutrition of moderate degree Serum albumin started to improve but she continues to have some mild weight loss I recommend starting her on low-dose mirtazapine that should also help with her coping We discussed importance of adding additional nutritional supplement  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    Answer:   [facility]    Order Specific Question:   Radiology Contrast Protocol - do NOT remove file path    Answer:   [email or web address]    All questions were answered. The patient knows to call the clinic with any problems, questions or concerns. The total time spent in the appointment was 40 minutes encounter with  patients including review of chart and various tests results, discussions about plan of care and coordination of care plan   Heath Lark, MD 06/13/2022 1:12 PM  INTERVAL HISTORY: Please see below for problem oriented charting. she returns for treatment follow-up with her mother She has lost a bit of weight again She is attempting to eat on a regular basis Her chronic pain is stable She continues to have shortness of breath but stable She has mild persistent constipation but no nausea or vomiting  REVIEW OF SYSTEMS:   Constitutional: Denies fevers, chills  Eyes: Denies blurriness of vision Ears, nose, mouth, throat, and face: Denies mucositis or sore throat Respiratory: Denies cough, dyspnea or wheezes Cardiovascular: Denies palpitation, chest discomfort or Gastrointestinal:  Denies nausea, heartburn or change in bowel habits Lymphatics: Denies new lymphadenopathy or easy bruising Neurological:Denies numbness, tingling or new weaknesses Behavioral/Psych: Mood is stable, no new changes  All other systems were reviewed with the patient and are negative.  I have reviewed the past medical history, past surgical history, social history and family history with the patient and they are unchanged from previous note.  ALLERGIES:  has No Known Allergies.  MEDICATIONS:  Current Outpatient Medications  Medication Sig Dispense Refill   magnesium oxide (MAG-OX) 400 (240 Mg) MG tablet Take 1 tablet (400 mg total) by mouth daily. 30 tablet 3   abemaciclib (VERZENIO) 100 MG tablet Take 1 tablet (100 mg total) by mouth 2 (two) times daily. 56 tablet 11   anastrozole (ARIMIDEX) 1 MG tablet Take 1 tablet (1 mg total) by mouth daily. 30 tablet 11  calcium carbonate (TUMS - DOSED IN MG ELEMENTAL CALCIUM) 500 MG chewable tablet Chew 1 tablet by mouth 2 (two) times daily.     cholecalciferol (VITAMIN D3) 25 MCG (1000 UNIT) tablet Take 2,000 Units by mouth daily.     famotidine (PEPCID) 20 MG tablet Take  1 tablet (20 mg total) by mouth 2 (two) times daily. 60 tablet 2   HYDROmorphone (DILAUDID) 8 MG tablet Take 1 tablet (8 mg total) by mouth every 6 (six) hours as needed for moderate pain. 90 tablet 0   metroNIDAZOLE (METROGEL) 1 % gel Apply topically daily. 60 g 0   mirtazapine (REMERON) 7.5 MG tablet Take 1 tablet (7.5 mg total) by mouth at bedtime. 30 tablet 1   polyethylene glycol (MIRALAX / GLYCOLAX) 17 g packet Take 17 g by mouth daily.     senna (SENOKOT) 8.6 MG tablet Take 2 tablets by mouth 2 (two) times daily.     No current facility-administered medications for this visit.    SUMMARY OF ONCOLOGIC HISTORY: Oncology History Overview Note  The tumor cells are EQUIVOCAL For Her2 (2+).   Estrogen Receptor:       POSITIVE, 100%, STRONG STAINING  Progesterone Receptor:   NEGATIVE (0)  Proliferation Marker Ki-67:   45%    Primary breast malignancy ([place])  03/22/2022 Initial Diagnosis   Primary breast malignancy ([place])   03/24/2022 Pathology Results   SURGICAL PATHOLOGY  CASE: [id number]  PATIENT: [name]  Surgical Pathology Report   Clinical History: Ulcerative right breast lesion and probable metastatic breast cancer ([facility])   FINAL MICROSCOPIC DIAGNOSIS:   A. SOFT TISSUE MASS, RIGHT UPPER CHEST WALL, NEEDLE CORE BIOPSY:  - Invasive ductal carcinoma, see comment  - Ductal carcinoma in situ: Not identified   - Tubule formation: Score 3  - Nuclear pleomorphism: Score 3  - Mitotic count: Score 3  - Total score: 9  - Overall Grade: 3  - Lymphovascular invasion: Not identified  - Cancer Length: 13 mm  - Calcifications: Not identified  - Other findings: None    03/28/2022 Cancer Staging   Staging form: Breast, AJCC 8th Edition - Clinical stage from 03/28/2022: Stage IV (cT4d, cN3c, pM1, G3, ER+, PR-, HER2: Equivocal) - Signed by Heath Lark, MD on 03/28/2022 Stage prefix: Initial diagnosis Nuclear grade: G3 Histologic grading system: 3 grade system   04/17/2022 -   Chemotherapy   She started taking combination of abemaciclib with anastrozole     PHYSICAL EXAMINATION: ECOG PERFORMANCE STATUS: 1 - Symptomatic but completely ambulatory  Vitals:   06/12/22 1340  BP: (!) 105/49  Pulse: (!) 104  Resp: 18  Temp: 98.5 F (36.9 C)  SpO2: 100%   Filed Weights   06/12/22 1340  Weight: 128 lb 3.2 oz (58.2 kg)    GENERAL:alert, no distress and comfortable SKIN: Her chest wall is examined.  Noted healthy granulation tissue throughout her chest wall EYES: normal, Conjunctiva are pink and non-injected, sclera clear OROPHARYNX:no exudate, no erythema and lips, buccal mucosa, and tongue normal  NECK: supple, thyroid normal size, non-tender, without nodularity LYMPH:  no palpable lymphadenopathy in the cervical, axillary or inguinal LUNGS: Reduced breath sound on the right lung HEART: regular rate & rhythm and no murmurs and no lower extremity edema ABDOMEN:abdomen soft, non-tender and normal bowel sounds Musculoskeletal:no cyanosis of digits and no clubbing.  Right upper extremity lymphedema is slowly improving NEURO: alert & oriented x 3 with fluent speech, no focal motor/sensory deficits  LABORATORY DATA:  I have reviewed the data as listed    Component Value Date/Time   NA 138 06/12/2022 1255   K 4.1 06/12/2022 1255   CL 105 06/12/2022 1255   CO2 30 06/12/2022 1255   GLUCOSE 115 (H) 06/12/2022 1255   BUN 11 06/12/2022 1255   CREATININE 0.79 06/12/2022 1255   CALCIUM 8.8 (L) 06/12/2022 1255   PROT 6.8 06/12/2022 1255   ALBUMIN 3.4 (L) 06/12/2022 1255   AST 16 06/12/2022 1255   ALT 9 06/12/2022 1255   ALKPHOS 52 06/12/2022 1255   BILITOT 0.3 06/12/2022 1255   GFRNONAA >60 06/12/2022 1255    No results found for: "SPEP", "UPEP"  Lab Results  Component Value Date   WBC 2.9 (L) 06/12/2022   NEUTROABS 1.6 (L) 06/12/2022   HGB 9.6 (L) 06/12/2022   HCT 31.2 (L) 06/12/2022   MCV 80.0 06/12/2022   PLT 377 06/12/2022      Chemistry       Component Value Date/Time   NA 138 06/12/2022 1255   K 4.1 06/12/2022 1255   CL 105 06/12/2022 1255   CO2 30 06/12/2022 1255   BUN 11 06/12/2022 1255   CREATININE 0.79 06/12/2022 1255      Component Value Date/Time   CALCIUM 8.8 (L) 06/12/2022 1255   ALKPHOS 52 06/12/2022 1255   AST 16 06/12/2022 1255   ALT 9 06/12/2022 1255   BILITOT 0.3 06/12/2022 1255

## 2022-06-13 NOTE — Assessment & Plan Note (Signed)
Serum albumin started to improve but she continues to have some mild weight loss I recommend starting her on low-dose mirtazapine that should also help with her coping We discussed importance of adding additional nutritional supplement

## 2022-06-24 ENCOUNTER — Other Ambulatory Visit (HOSPITAL_COMMUNITY): Payer: Self-pay

## 2022-06-25 ENCOUNTER — Ambulatory Visit (HOSPITAL_COMMUNITY)
Admission: RE | Admit: 2022-06-25 | Discharge: 2022-06-25 | Disposition: A | Discharge status: Home / Self Care | Payer: BC Managed Care – PPO | Source: Ambulatory Visit | Attending: Hematology and Oncology | Admitting: Hematology and Oncology

## 2022-06-25 DIAGNOSIS — C7951 Secondary malignant neoplasm of bone: Secondary | ICD-10-CM | POA: Diagnosis present

## 2022-06-25 DIAGNOSIS — C50911 Malignant neoplasm of unspecified site of right female breast: Secondary | ICD-10-CM | POA: Diagnosis not present

## 2022-06-25 MED ORDER — SODIUM CHLORIDE (PF) 0.9 % IJ SOLN
INTRAMUSCULAR | Status: AC
  Filled 2022-06-25: qty 50

## 2022-06-25 MED ORDER — IOHEXOL 300 MG/ML  SOLN
100.0000 mL | Freq: Once | INTRAMUSCULAR | Status: AC | PRN
  Administered 2022-06-25: 100 mL via INTRAVENOUS

## 2022-06-26 ENCOUNTER — Telehealth: Payer: Self-pay

## 2022-06-26 NOTE — Telephone Encounter (Signed)
Notified Patient of completion of critical illness form. Fax transmission confirmation received. Copy of form placed for pick-up as requested by Patient. No other needs or concerns voiced at this time.

## 2022-06-27 ENCOUNTER — Inpatient Hospital Stay: Payer: BC Managed Care – PPO

## 2022-06-27 ENCOUNTER — Other Ambulatory Visit: Payer: Self-pay

## 2022-06-27 ENCOUNTER — Other Ambulatory Visit (HOSPITAL_COMMUNITY): Payer: Self-pay

## 2022-06-27 ENCOUNTER — Inpatient Hospital Stay (HOSPITAL_BASED_OUTPATIENT_CLINIC_OR_DEPARTMENT_OTHER): Payer: BC Managed Care – PPO | Admitting: Hematology and Oncology

## 2022-06-27 DIAGNOSIS — J90 Pleural effusion, not elsewhere classified: Secondary | ICD-10-CM | POA: Diagnosis not present

## 2022-06-27 DIAGNOSIS — D509 Iron deficiency anemia, unspecified: Secondary | ICD-10-CM

## 2022-06-27 DIAGNOSIS — K5909 Other constipation: Secondary | ICD-10-CM | POA: Diagnosis not present

## 2022-06-27 DIAGNOSIS — E440 Moderate protein-calorie malnutrition: Secondary | ICD-10-CM

## 2022-06-27 DIAGNOSIS — G893 Neoplasm related pain (acute) (chronic): Secondary | ICD-10-CM

## 2022-06-27 DIAGNOSIS — C50911 Malignant neoplasm of unspecified site of right female breast: Secondary | ICD-10-CM

## 2022-06-27 DIAGNOSIS — E559 Vitamin D deficiency, unspecified: Secondary | ICD-10-CM

## 2022-06-27 DIAGNOSIS — C7951 Secondary malignant neoplasm of bone: Secondary | ICD-10-CM

## 2022-06-27 LAB — CBC WITH DIFFERENTIAL/PLATELET
Abs Immature Granulocytes: 0 10*3/uL (ref 0.00–0.07)
Basophils Absolute: 0.1 10*3/uL (ref 0.0–0.1)
Basophils Relative: 2 %
Eosinophils Absolute: 0.1 10*3/uL (ref 0.0–0.5)
Eosinophils Relative: 2 %
HCT: 30.6 % — ABNORMAL LOW (ref 36.0–46.0)
Hemoglobin: 9.8 g/dL — ABNORMAL LOW (ref 12.0–15.0)
Immature Granulocytes: 0 %
Lymphocytes Relative: 40 %
Lymphs Abs: 1.5 10*3/uL (ref 0.7–4.0)
MCH: 25.3 pg — ABNORMAL LOW (ref 26.0–34.0)
MCHC: 32 g/dL (ref 30.0–36.0)
MCV: 78.9 fL — ABNORMAL LOW (ref 80.0–100.0)
Monocytes Absolute: 0.2 10*3/uL (ref 0.1–1.0)
Monocytes Relative: 6 %
Neutro Abs: 1.9 10*3/uL (ref 1.7–7.7)
Neutrophils Relative %: 50 %
Platelets: 362 10*3/uL (ref 150–400)
RBC: 3.88 MIL/uL (ref 3.87–5.11)
RDW: 25.8 % — ABNORMAL HIGH (ref 11.5–15.5)
WBC: 3.8 10*3/uL — ABNORMAL LOW (ref 4.0–10.5)
nRBC: 0 % (ref 0.0–0.2)

## 2022-06-27 LAB — COMPREHENSIVE METABOLIC PANEL
ALT: 10 U/L (ref 0–44)
AST: 17 U/L (ref 15–41)
Albumin: 3.6 g/dL (ref 3.5–5.0)
Alkaline Phosphatase: 49 U/L (ref 38–126)
Anion gap: 2 — ABNORMAL LOW (ref 5–15)
BUN: 9 mg/dL (ref 8–23)
CO2: 31 mmol/L (ref 22–32)
Calcium: 8.8 mg/dL — ABNORMAL LOW (ref 8.9–10.3)
Chloride: 106 mmol/L (ref 98–111)
Creatinine, Ser: 0.91 mg/dL (ref 0.44–1.00)
GFR, Estimated: >60 mL/min (ref >60)
Glucose, Bld: 119 mg/dL — ABNORMAL HIGH (ref 70–99)
Potassium: 4.3 mmol/L (ref 3.5–5.1)
Sodium: 139 mmol/L (ref 135–145)
Total Bilirubin: 0.4 mg/dL (ref 0.3–1.2)
Total Protein: 7.5 g/dL (ref 6.5–8.1)

## 2022-06-27 LAB — SAMPLE TO BLOOD BANK

## 2022-06-27 LAB — VITAMIN D 25 HYDROXY (VIT D DEFICIENCY, FRACTURES): Vit D, 25-Hydroxy: 26.71 ng/mL — ABNORMAL LOW (ref 30–100)

## 2022-06-27 MED ORDER — HYDROMORPHONE HCL 8 MG PO TABS
8.0000 mg | ORAL_TABLET | Freq: Four times a day (QID) | ORAL | 0 refills | Status: DC | PRN
  Filled 2022-06-27: qty 90, 23d supply, fill #0

## 2022-06-28 NOTE — Progress Notes (Signed)
[place] OFFICE PROGRESS NOTE  Patient Care Team: Heath Lark, MD as PCP - General (Hematology and Oncology)  ASSESSMENT & PLAN:  Primary breast malignancy ([place]) Overall, she is responding to treatment clinically and CT imaging showed greater than 50% reduction of tumor burden Her swelling on the right upper extremity is less The size of her breast mass on the right has reduced in size further with healthy granulation tissue Her nutritional status has improved She will continue abemaciclib in combination with anastrozole I plan to repeat imaging study in 4 months, due in January 2024 She will receive Delton See every 3 months  Breast cancer metastasized to bone, right ([place]) She is reminded to take vitamin D and calcium She has obtained dental clearance She will continue Xgeva every 3 months, next due in October  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 She will continue prescribed hydromorphone  Pleural effusion She has persistent signs of pleural effusion on the right but with normal oxygen saturation I do not feel strongly she needs to have a thoracentesis right now  Other constipation We discussed importance of regular laxatives  Malnutrition of moderate degree Serum albumin started to improve but she continues to have some mild weight loss I recommend her to increase oral intake We discussed importance of adding additional nutritional supplement  No orders of the defined types were placed in this encounter.   All questions were answered. The patient knows to call the clinic with any problems, questions or concerns. The total time spent in the appointment was 40 minutes encounter with patients including review of chart and various tests results, discussions about plan of care and coordination of care plan   Heath Lark, MD 06/28/2022 8:28 PM  INTERVAL HISTORY: Please see below for problem oriented charting. she returns for  treatment follow-up and review of test results Her mother is present throughout the visit She has lost some weight Constipation resolves after Ct imaging Pain control is fair  REVIEW OF SYSTEMS:   Constitutional: Denies fevers, chills or abnormal weight loss Eyes: Denies blurriness of vision Ears, nose, mouth, throat, and face: Denies mucositis or sore throat Respiratory: Denies cough, dyspnea or wheezes Cardiovascular: Denies palpitation, chest discomfort or lower extremity swelling Skin: Denies abnormal skin rashes Lymphatics: Denies new lymphadenopathy or easy bruising Neurological:Denies numbness, tingling or new weaknesses Behavioral/Psych: Mood is stable, no new changes  All other systems were reviewed with the patient and are negative.  I have reviewed the past medical history, past surgical history, social history and family history with the patient and they are unchanged from previous note.  ALLERGIES:  has No Known Allergies.  MEDICATIONS:  Current Outpatient Medications  Medication Sig Dispense Refill   abemaciclib (VERZENIO) 100 MG tablet Take 1 tablet (100 mg total) by mouth 2 (two) times daily. 56 tablet 11   anastrozole (ARIMIDEX) 1 MG tablet Take 1 tablet (1 mg total) by mouth daily. 30 tablet 11   calcium carbonate (TUMS - DOSED IN MG ELEMENTAL CALCIUM) 500 MG chewable tablet Chew 1 tablet by mouth 2 (two) times daily.     cholecalciferol (VITAMIN D3) 25 MCG (1000 UNIT) tablet Take 2,000 Units by mouth daily.     famotidine (PEPCID) 20 MG tablet Take 1 tablet (20 mg total) by mouth 2 (two) times daily. 60 tablet 2   HYDROmorphone (DILAUDID) 8 MG tablet Take 1 tablet (8 mg total) by mouth every 6 (six) hours as needed for moderate pain. [name]  tablet 0   magnesium oxide (MAG-OX) 400 (240 Mg) MG tablet Take 1 tablet (400 mg total) by mouth daily. 30 tablet 3   metroNIDAZOLE (METROGEL) 1 % gel Apply topically daily. 60 g 0   mirtazapine (REMERON) 7.5 MG tablet Take 1 tablet  (7.5 mg total) by mouth at bedtime. 30 tablet 1   polyethylene glycol (MIRALAX / GLYCOLAX) 17 g packet Take 17 g by mouth daily.     senna (SENOKOT) 8.6 MG tablet Take 2 tablets by mouth 2 (two) times daily.     No current facility-administered medications for this visit.    SUMMARY OF ONCOLOGIC HISTORY: Oncology History Overview Note  The tumor cells are EQUIVOCAL For Her2 (2+).   Estrogen Receptor:       POSITIVE, 100%, STRONG STAINING  Progesterone Receptor:   NEGATIVE (0)  Proliferation Marker Ki-67:   45%    Primary breast malignancy ([place])  03/22/2022 Initial Diagnosis   Primary breast malignancy ([place])   03/24/2022 Pathology Results   SURGICAL PATHOLOGY  CASE: [phone]  PATIENT: [name]  Surgical Pathology Report   Clinical History: Ulcerative right breast lesion and probable metastatic breast cancer ([facility])   FINAL MICROSCOPIC DIAGNOSIS:   A. SOFT TISSUE MASS, RIGHT UPPER CHEST WALL, NEEDLE CORE BIOPSY:  - Invasive ductal carcinoma, see comment  - Ductal carcinoma in situ: Not identified   - Tubule formation: Score 3  - Nuclear pleomorphism: Score 3  - Mitotic count: Score 3  - Total score: 9  - Overall Grade: 3  - Lymphovascular invasion: Not identified  - Cancer Length: 13 mm  - Calcifications: Not identified  - Other findings: None    03/28/2022 Cancer Staging   Staging form: Breast, AJCC 8th Edition - Clinical stage from 03/28/2022: Stage IV (cT4d, cN3c, pM1, G3, ER+, PR-, HER2: Equivocal) - Signed by Heath Lark, MD on 03/28/2022 Stage prefix: Initial diagnosis Nuclear grade: G3 Histologic grading system: 3 grade system   04/17/2022 -  Chemotherapy   She started taking combination of abemaciclib with anastrozole   06/27/2022 Imaging   1. Ulcerated mass of the right breast with extension of nodular enhancing soft tissue to the right chest wall slight extension into the contralateral left breast, markedly decreased in size when compared with prior  exam. 2. Interval decreased size of left axillary lymph node. 3. Solid pulmonary nodules are decreased in size when compared with prior exam. 4. Large right pleural effusion with complete collapse of the right lower lobe, pleural effusion is decreased in size when compared with prior exam. 5. Stable low-attenuation liver lesions which are likely simple cysts. 6. Lytic and sclerotic osseous lesions again seen, many of which demonstrate increased sclerosis, likely due to treatment response. 7. Pathologic compression fracture of T3 demonstrates increased height loss, although there is no evidence of significant retropulsion. 8. Large stool ball in the rectum, correlate for constipation.       PHYSICAL EXAMINATION: ECOG PERFORMANCE STATUS: 1 - Symptomatic but completely ambulatory  Vitals:   06/27/22 1359  BP: (!) 141/80  Pulse: (!) 101  Resp: 18  Temp: 97.8 F (36.6 C)  SpO2: 99%   Filed Weights   06/27/22 1359  Weight: 123 lb 6.4 oz (56 kg)    GENERAL:alert, no distress and comfortable NEURO: alert & oriented x 3 with fluent speech, no focal motor/sensory deficits  LABORATORY DATA:  I have reviewed the data as listed    Component Value Date/Time   NA 139 06/27/2022 1306  K 4.3 06/27/2022 1306   CL 106 06/27/2022 1306   CO2 31 06/27/2022 1306   GLUCOSE 119 (H) 06/27/2022 1306   BUN 9 06/27/2022 1306   CREATININE 0.91 06/27/2022 1306   CALCIUM 8.8 (L) 06/27/2022 1306   PROT 7.5 06/27/2022 1306   ALBUMIN 3.6 06/27/2022 1306   AST 17 06/27/2022 1306   ALT 10 06/27/2022 1306   ALKPHOS 49 06/27/2022 1306   BILITOT 0.4 06/27/2022 1306   GFRNONAA >60 06/27/2022 1306    No results found for: "SPEP", "UPEP"  Lab Results  Component Value Date   WBC 3.8 (L) 06/27/2022   NEUTROABS 1.9 06/27/2022   HGB 9.8 (L) 06/27/2022   HCT 30.6 (L) 06/27/2022   MCV 78.9 (L) 06/27/2022   PLT 362 06/27/2022      Chemistry      Component Value Date/Time   NA 139 06/27/2022 1306    K 4.3 06/27/2022 1306   CL 106 06/27/2022 1306   CO2 31 06/27/2022 1306   BUN 9 06/27/2022 1306   CREATININE 0.91 06/27/2022 1306      Component Value Date/Time   CALCIUM 8.8 (L) 06/27/2022 1306   ALKPHOS 49 06/27/2022 1306   AST 17 06/27/2022 1306   ALT 10 06/27/2022 1306   BILITOT 0.4 06/27/2022 1306       RADIOGRAPHIC STUDIES:I have reviewed imaging with her I have personally reviewed the radiological images as listed and agreed with the findings in the report. CT CHEST ABDOMEN PELVIS W CONTRAST  Result Date: 06/26/2022 CLINICAL DATA:  Metastatic breast cancer; * Tracking Code: BO * EXAM: CT CHEST, ABDOMEN, AND PELVIS WITH CONTRAST TECHNIQUE: Multidetector CT imaging of the chest, abdomen and pelvis was performed following the standard protocol during bolus administration of intravenous contrast. RADIATION DOSE REDUCTION: This exam was performed according to the departmental dose-optimization program which includes automated exposure control, adjustment of the mA and/or kV according to patient size and/or use of iterative reconstruction technique. CONTRAST:  196m OMNIPAQUE IOHEXOL 300 MG/ML  SOLN COMPARISON:  CT chest, abdomen and pelvis dated March 22, 2022 FINDINGS: CT CHEST FINDINGS Cardiovascular: Heart size. No pericardial effusion. Normal caliber thoracic aorta with no significant atherosclerotic disease. No suspicious of the central pulmonary arteries. No visible coronary artery calcifications. Mediastinum/Nodes: Esophagus and thyroid are unremarkable. Interval decreased size of enlarged left axillary lymph node, measuring 0.9 cm in short axis on series 2, image 24, previously 1.6 cm. Lungs/Pleura: Large right pleural effusion with complete collapse of the right lower lobe, pleural effusion is decreased in size when compared with prior exam. Solid pulmonary nodule of the right upper lobe measuring 6 mm on series 6, image 69 possibly obscured on prior exam due to atelectasis. Solid  and ground-glass nodule of the left lung apex measuring 10 x 6 mm on series 6, image 28, previously 13 x 7 mm on more solid-appearing. Irregular left upper lobe nodule measuring 7 mm on image 34, previously 13 mm. Musculoskeletal: Ulcerated mass of the right breast with extension of nodular enhancing soft tissue to the right chest wall and mild extension into the contralateral left breast, markedly decreased in size when compared with prior exam. Lytic and sclerotic osseous lesions again seen, many of which demonstrate increased sclerosis. For example, lesion of T3 with pathologic compression fracture, increased vertebral body height loss and sclerosis when compared with prior. Lesions of the sternum and right fifth rib demonstrates increased sclerosis. No new lesions. CT ABDOMEN PELVIS FINDINGS Hepatobiliary: Scattered low-attenuation liver  lesions, unchanged when compared with prior and likely simple cysts. Gallbladder is unremarkable. No biliary ductal dilation. Pancreas: Unremarkable. No pancreatic ductal dilatation or surrounding inflammatory changes. Spleen: Normal in size without focal abnormality. Adrenals/Urinary Tract: Bilateral adrenal glands are unremarkable. Stable simple appearing cyst of the left kidney, no specific follow-up imaging is recommended for this lesion. Bladder is decompressed. Stomach/Bowel: Stomach is within normal limits. Large stool ball in the rectum. No evidence of bowel wall thickening, distention, or inflammatory changes. Vascular/Lymphatic: No significant vascular findings are present. No enlarged abdominal or pelvic lymph nodes. Reproductive: Uterus and bilateral adnexa are unremarkable. Other: Diffuse soft tissue anasarca.  No abdominopelvic ascites. Musculoskeletal: Lytic and sclerotic lesions again seen, many of which demonstrate increased sclerosis. No new lesions. For example, lesion of the right pubic symphysis demonstrates increased sclerosis and there is increased  sclerosis of the L4 vertebral body lesion. IMPRESSION: 1. Ulcerated mass of the right breast with extension of nodular enhancing soft tissue to the right chest wall slight extension into the contralateral left breast, markedly decreased in size when compared with prior exam. 2. Interval decreased size of left axillary lymph node. 3. Solid pulmonary nodules are decreased in size when compared with prior exam. 4. Large right pleural effusion with complete collapse of the right lower lobe, pleural effusion is decreased in size when compared with prior exam. 5. Stable low-attenuation liver lesions which are likely simple cysts. 6. Lytic and sclerotic osseous lesions again seen, many of which demonstrate increased sclerosis, likely due to treatment response. 7. Pathologic compression fracture of T3 demonstrates increased height loss, although there is no evidence of significant retropulsion. 8. Large stool ball in the rectum, correlate for constipation. Electronically Signed   By: Yetta Glassman M.D.   On: 06/26/2022 15:23

## 2022-06-28 NOTE — Assessment & Plan Note (Signed)
She has persistent signs of pleural effusion on the right but with normal oxygen saturation I do not feel strongly she needs to have a thoracentesis right now

## 2022-06-28 NOTE — Assessment & Plan Note (Signed)
Her cancer associated pain is well-controlled She will continue prescribed hydromorphone

## 2022-06-28 NOTE — Assessment & Plan Note (Signed)
Overall, she is responding to treatment clinically and CT imaging showed greater than 50% reduction of tumor burden Her swelling on the right upper extremity is less The size of her breast mass on the right has reduced in size further with healthy granulation tissue Her nutritional status has improved She will continue abemaciclib in combination with anastrozole I plan to repeat imaging study in 4 months, due in January 2024 She will receive Xgeva every 3 months

## 2022-06-28 NOTE — Assessment & Plan Note (Signed)
She is reminded to take vitamin D and calcium She has obtained dental clearance She will continue Xgeva every 3 months, next due in October

## 2022-06-28 NOTE — Assessment & Plan Note (Signed)
We discussed importance of regular laxatives 

## 2022-06-28 NOTE — Assessment & Plan Note (Signed)
Serum albumin started to improve but she continues to have some mild weight loss I recommend her to increase oral intake We discussed importance of adding additional nutritional supplement

## 2022-06-29 ENCOUNTER — Encounter: Payer: Self-pay | Admitting: Hematology and Oncology

## 2022-06-30 ENCOUNTER — Other Ambulatory Visit (HOSPITAL_COMMUNITY): Payer: Self-pay

## 2022-06-30 ENCOUNTER — Encounter: Payer: Self-pay | Admitting: Nutrition

## 2022-06-30 ENCOUNTER — Inpatient Hospital Stay: Payer: BC Managed Care – PPO | Attending: Hematology and Oncology

## 2022-06-30 DIAGNOSIS — N179 Acute kidney failure, unspecified: Secondary | ICD-10-CM | POA: Insufficient documentation

## 2022-06-30 DIAGNOSIS — Z170 Estrogen receptor positive status [ER+]: Secondary | ICD-10-CM | POA: Insufficient documentation

## 2022-06-30 DIAGNOSIS — C50911 Malignant neoplasm of unspecified site of right female breast: Secondary | ICD-10-CM | POA: Insufficient documentation

## 2022-06-30 DIAGNOSIS — E440 Moderate protein-calorie malnutrition: Secondary | ICD-10-CM | POA: Insufficient documentation

## 2022-06-30 NOTE — Progress Notes (Signed)
[facility] CSW Progress Note  Clinical Education officer, museum contacted patient by phone to assess needs.  Patient requested a case of Ensure.  Discussed with Dietician, and was given Ensure Complete to give to patient with instructions.  CSW left af the check-in desk for patient to obtain.  Informed patient of this.  She reports being stable.  CSW to continue exploring food delivery services for patient.    Wendy Pickle Shondale Quinley, LCSW

## 2022-06-30 NOTE — Progress Notes (Signed)
Provided one complimentary case of Ensure Complete. Instructed to drink 1-2 daily.

## 2022-07-14 ENCOUNTER — Other Ambulatory Visit (HOSPITAL_COMMUNITY): Payer: Self-pay

## 2022-07-15 ENCOUNTER — Inpatient Hospital Stay: Payer: BC Managed Care – PPO | Admitting: Licensed Clinical Social Worker

## 2022-07-15 NOTE — Progress Notes (Signed)
[facility] CSW Progress Note  Clinical Education officer, museum was contacted by patient's mother, Carole Civil, via telephone to discuss resources.  The person who was delivering meals to [place] is currently unable to do so.  CSW left a vm and emailed the [employer] with a referral for Meals on Wheels.  CSW was informed that patient did receive financial assistance from one of the grants she applied for.    Rodman Pickle Darah Simkin, LCSW

## 2022-07-23 ENCOUNTER — Encounter: Payer: Self-pay | Admitting: Nutrition

## 2022-07-23 NOTE — Progress Notes (Signed)
SE informed RD that patient was requesting an additional case of Ensure Plus HP.  One case will be left at the front desk of the cancer center on Friday with patient's name on it.  \

## 2022-07-25 ENCOUNTER — Encounter: Payer: Self-pay | Admitting: Hematology and Oncology

## 2022-07-25 ENCOUNTER — Other Ambulatory Visit (HOSPITAL_COMMUNITY): Payer: Self-pay

## 2022-07-25 ENCOUNTER — Inpatient Hospital Stay (HOSPITAL_BASED_OUTPATIENT_CLINIC_OR_DEPARTMENT_OTHER): Payer: BC Managed Care – PPO | Admitting: Hematology and Oncology

## 2022-07-25 ENCOUNTER — Encounter: Payer: Self-pay | Admitting: Nutrition

## 2022-07-25 ENCOUNTER — Inpatient Hospital Stay: Payer: BC Managed Care – PPO

## 2022-07-25 VITALS — BP 120/52 | HR 100 | Resp 18 | Ht 66.6 in | Wt 120.8 lb

## 2022-07-25 DIAGNOSIS — D509 Iron deficiency anemia, unspecified: Secondary | ICD-10-CM

## 2022-07-25 DIAGNOSIS — C50911 Malignant neoplasm of unspecified site of right female breast: Secondary | ICD-10-CM | POA: Diagnosis present

## 2022-07-25 DIAGNOSIS — Z7189 Other specified counseling: Secondary | ICD-10-CM | POA: Diagnosis not present

## 2022-07-25 DIAGNOSIS — E440 Moderate protein-calorie malnutrition: Secondary | ICD-10-CM | POA: Diagnosis not present

## 2022-07-25 DIAGNOSIS — Z170 Estrogen receptor positive status [ER+]: Secondary | ICD-10-CM | POA: Diagnosis not present

## 2022-07-25 DIAGNOSIS — N179 Acute kidney failure, unspecified: Secondary | ICD-10-CM | POA: Diagnosis not present

## 2022-07-25 LAB — CBC WITH DIFFERENTIAL/PLATELET
Abs Immature Granulocytes: 0.03 10*3/uL (ref 0.00–0.07)
Basophils Absolute: 0 10*3/uL (ref 0.0–0.1)
Basophils Relative: 0 %
Eosinophils Absolute: 0 10*3/uL (ref 0.0–0.5)
Eosinophils Relative: 0 %
HCT: 34.5 % — ABNORMAL LOW (ref 36.0–46.0)
Hemoglobin: 10.9 g/dL — ABNORMAL LOW (ref 12.0–15.0)
Immature Granulocytes: 1 %
Lymphocytes Relative: 12 %
Lymphs Abs: 0.6 10*3/uL — ABNORMAL LOW (ref 0.7–4.0)
MCH: 26.3 pg (ref 26.0–34.0)
MCHC: 31.6 g/dL (ref 30.0–36.0)
MCV: 83.1 fL (ref 80.0–100.0)
Monocytes Absolute: 0.2 10*3/uL (ref 0.1–1.0)
Monocytes Relative: 4 %
Neutro Abs: 3.9 10*3/uL (ref 1.7–7.7)
Neutrophils Relative %: 83 %
Platelets: 288 10*3/uL (ref 150–400)
RBC: 4.15 MIL/uL (ref 3.87–5.11)
RDW: 18.6 % — ABNORMAL HIGH (ref 11.5–15.5)
WBC: 4.8 10*3/uL (ref 4.0–10.5)
nRBC: 0 % (ref 0.0–0.2)

## 2022-07-25 LAB — COMPREHENSIVE METABOLIC PANEL
ALT: 15 U/L (ref 0–44)
AST: 36 U/L (ref 15–41)
Albumin: 3.8 g/dL (ref 3.5–5.0)
Alkaline Phosphatase: 41 U/L (ref 38–126)
Anion gap: 10 (ref 5–15)
BUN: 33 mg/dL — ABNORMAL HIGH (ref 8–23)
CO2: 28 mmol/L (ref 22–32)
Calcium: 9.7 mg/dL (ref 8.9–10.3)
Chloride: 100 mmol/L (ref 98–111)
Creatinine, Ser: 2.24 mg/dL — ABNORMAL HIGH (ref 0.44–1.00)
GFR, Estimated: 24 mL/min — ABNORMAL LOW (ref >60)
Glucose, Bld: 120 mg/dL — ABNORMAL HIGH (ref 70–99)
Potassium: 4.8 mmol/L (ref 3.5–5.1)
Sodium: 138 mmol/L (ref 135–145)
Total Bilirubin: 0.5 mg/dL (ref 0.3–1.2)
Total Protein: 8.1 g/dL (ref 6.5–8.1)

## 2022-07-25 LAB — SAMPLE TO BLOOD BANK

## 2022-07-25 NOTE — Progress Notes (Signed)
Provided one complimentary case of Ensure Plus HP. 

## 2022-07-25 NOTE — Assessment & Plan Note (Signed)
We had numerous goals of care discussions in the past We have provided ample help with social services, assistance to pay for medications, etc. as well as application for Meals on Wheels for her Ultimately, the patient needs to eat to improve her nutritional status and her overall prognosis

## 2022-07-25 NOTE — Progress Notes (Signed)
[facility] OFFICE PROGRESS NOTE  Patient Care Team: Heath Lark, MD as PCP - General (Hematology and Oncology)  ASSESSMENT & PLAN:  Primary breast malignancy ([place]) I have reviewed imaging study with her sister Overall, her clinical exam emanation today is even better compared to my last exam His chest wall is completely dry and the area of healthy granulation tissue measure approximately 2-1/2 inches across in diameter My major concern is due to her malignant cachexia, noncompliance with dietary management recommendation and acute renal failure seen today from poor oral intake We discussed importance of frequent small meals and adequate hydration I will see her again next month for further follow-up  Malnutrition of moderate degree She has lost more weight despite appetite stimulant According to the patient, she has difficulties obtaining food from her chef However, her sister and mother had stocked her house with food but yet the patient is not consuming the food provided She also have multiple cases of nutritional supplement but only takes 2 ensures per day despite multiple counseling sessions recommending more oral intake as tolerated  Acute renal failure (ARF) ([place]) She has signs of acute renal failure due to poor oral intake We discussed risk of dialysis in the future if her renal function gets worse  Goals of care, counseling/discussion We had numerous goals of care discussions in the past We have provided ample help with social services, assistance to pay for medications, etc. as well as application for Meals on Wheels for her Ultimately, the patient needs to eat to improve her nutritional status and her overall prognosis  No orders of the defined types were placed in this encounter.   All questions were answered. The patient knows to call the clinic with any problems, questions or concerns. The total time spent in the appointment was 55 minutes encounter with  patients including review of chart and various tests results, discussions about plan of care and coordination of care plan   Heath Lark, MD 07/25/2022 4:06 PM  INTERVAL HISTORY: Please see below for problem oriented charting. she returns for treatment follow-up with her sister, on chronic antiestrogen therapy and abemaciclib for metastatic breast cancer Since last time I saw her, she have lost more weight Her oral intake remain poor and the patient felt that this is because her chef friend is no longer able to bring food to her house even though family members felt that she has enough food at home but refused to eat She denies pain Her wound is healing well  REVIEW OF SYSTEMS:   Constitutional: Denies fevers, chills  Eyes: Denies blurriness of vision Ears, nose, mouth, throat, and face: Denies mucositis or sore throat Respiratory: Denies cough, dyspnea or wheezes Cardiovascular: Denies palpitation, chest discomfort or lower extremity swelling Lymphatics: Denies new lymphadenopathy or easy bruising Neurological:Denies numbness, tingling or new weaknesses Behavioral/Psych: Mood is stable, no new changes  All other systems were reviewed with the patient and are negative.  I have reviewed the past medical history, past surgical history, social history and family history with the patient and they are unchanged from previous note.  ALLERGIES:  has No Known Allergies.  MEDICATIONS:  Current Outpatient Medications  Medication Sig Dispense Refill   abemaciclib (VERZENIO) 100 MG tablet Take 1 tablet (100 mg total) by mouth 2 (two) times daily. 56 tablet 11   anastrozole (ARIMIDEX) 1 MG tablet Take 1 tablet (1 mg total) by mouth daily. 30 tablet 11   calcium carbonate (TUMS - DOSED IN  MG ELEMENTAL CALCIUM) 500 MG chewable tablet Chew 1 tablet by mouth 2 (two) times daily.     cholecalciferol (VITAMIN D3) 25 MCG (1000 UNIT) tablet Take 2,000 Units by mouth daily.     famotidine (PEPCID) 20 MG  tablet Take 1 tablet (20 mg total) by mouth 2 (two) times daily. 60 tablet 2   HYDROmorphone (DILAUDID) 8 MG tablet Take 1 tablet (8 mg total) by mouth every 6 (six) hours as needed for moderate pain. 90 tablet 0   magnesium oxide (MAG-OX) 400 (240 Mg) MG tablet Take 1 tablet (400 mg total) by mouth daily. 30 tablet 3   metroNIDAZOLE (METROGEL) 1 % gel Apply topically daily. 60 g 0   mirtazapine (REMERON) 7.5 MG tablet Take 1 tablet (7.5 mg total) by mouth at bedtime. 30 tablet 1   polyethylene glycol (MIRALAX / GLYCOLAX) 17 g packet Take 17 g by mouth daily.     senna (SENOKOT) 8.6 MG tablet Take 2 tablets by mouth 2 (two) times daily.     No current facility-administered medications for this visit.    SUMMARY OF ONCOLOGIC HISTORY: Oncology History Overview Note  The tumor cells are EQUIVOCAL For Her2 (2+).   Estrogen Receptor:       POSITIVE, 100%, STRONG STAINING  Progesterone Receptor:   NEGATIVE (0)  Proliferation Marker Ki-67:   45%    Primary breast malignancy ([place])  03/22/2022 Initial Diagnosis   Primary breast malignancy ([place])   03/24/2022 Pathology Results   SURGICAL PATHOLOGY  CASE: [phone]  PATIENT: [name]  Surgical Pathology Report   Clinical History: Ulcerative right breast lesion and probable metastatic breast cancer ([facility])   FINAL MICROSCOPIC DIAGNOSIS:   A. SOFT TISSUE MASS, RIGHT UPPER CHEST WALL, NEEDLE CORE BIOPSY:  - Invasive ductal carcinoma, see comment  - Ductal carcinoma in situ: Not identified   - Tubule formation: Score 3  - Nuclear pleomorphism: Score 3  - Mitotic count: Score 3  - Total score: 9  - Overall Grade: 3  - Lymphovascular invasion: Not identified  - Cancer Length: 13 mm  - Calcifications: Not identified  - Other findings: None    03/28/2022 Cancer Staging   Staging form: Breast, AJCC 8th Edition - Clinical stage from 03/28/2022: Stage IV (cT4d, cN3c, pM1, G3, ER+, PR-, HER2: Equivocal) - Signed by Heath Lark, MD on  03/28/2022 Stage prefix: Initial diagnosis Nuclear grade: G3 Histologic grading system: 3 grade system   04/17/2022 -  Chemotherapy   She started taking combination of abemaciclib with anastrozole   06/27/2022 Imaging   1. Ulcerated mass of the right breast with extension of nodular enhancing soft tissue to the right chest wall slight extension into the contralateral left breast, markedly decreased in size when compared with prior exam. 2. Interval decreased size of left axillary lymph node. 3. Solid pulmonary nodules are decreased in size when compared with prior exam. 4. Large right pleural effusion with complete collapse of the right lower lobe, pleural effusion is decreased in size when compared with prior exam. 5. Stable low-attenuation liver lesions which are likely simple cysts. 6. Lytic and sclerotic osseous lesions again seen, many of which demonstrate increased sclerosis, likely due to treatment response. 7. Pathologic compression fracture of T3 demonstrates increased height loss, although there is no evidence of significant retropulsion. 8. Large stool ball in the rectum, correlate for constipation.       PHYSICAL EXAMINATION: ECOG PERFORMANCE STATUS: 2 - Symptomatic, <50% confined to bed  Vitals:  07/25/22 1405  BP: (!) 120/52  Pulse: 100  Resp: 18  SpO2: 100%   Filed Weights   07/25/22 1405  Weight: 120 lb 12.8 oz (54.8 kg)    GENERAL:alert, no distress and comfortable.  She is very thin and cachectic SKIN: skin color, texture, turgor are normal, no rashes or significant lesions EYES: normal, Conjunctiva are pink and non-injected, sclera clear OROPHARYNX:no exudate, no erythema and lips, buccal mucosa, and tongue normal  NECK: supple, thyroid normal size, non-tender, without nodularity LYMPH:  no palpable lymphadenopathy in the cervical, axillary or inguinal LUNGS: clear to auscultation and percussion with normal breathing effort HEART: regular rate & rhythm and  no murmurs and no lower extremity edema ABDOMEN:abdomen soft, non-tender and normal bowel sounds Musculoskeletal: Her right chest wall looks great with healthy granulation tissue NEURO: alert & oriented x 3 with fluent speech, no focal motor/sensory deficits  LABORATORY DATA:  I have reviewed the data as listed    Component Value Date/Time   NA 138 07/25/2022 1246   K 4.8 07/25/2022 1246   CL 100 07/25/2022 1246   CO2 28 07/25/2022 1246   GLUCOSE 120 (H) 07/25/2022 1246   BUN 33 (H) 07/25/2022 1246   CREATININE 2.24 (H) 07/25/2022 1246   CALCIUM 9.7 07/25/2022 1246   PROT 8.1 07/25/2022 1246   ALBUMIN 3.8 07/25/2022 1246   AST 36 07/25/2022 1246   ALT 15 07/25/2022 1246   ALKPHOS 41 07/25/2022 1246   BILITOT 0.5 07/25/2022 1246   GFRNONAA 24 (L) 07/25/2022 1246    No results found for: "SPEP", "UPEP"  Lab Results  Component Value Date   WBC 4.8 07/25/2022   NEUTROABS 3.9 07/25/2022   HGB 10.9 (L) 07/25/2022   HCT 34.5 (L) 07/25/2022   MCV 83.1 07/25/2022   PLT 288 07/25/2022      Chemistry      Component Value Date/Time   NA 138 07/25/2022 1246   K 4.8 07/25/2022 1246   CL 100 07/25/2022 1246   CO2 28 07/25/2022 1246   BUN 33 (H) 07/25/2022 1246   CREATININE 2.24 (H) 07/25/2022 1246      Component Value Date/Time   CALCIUM 9.7 07/25/2022 1246   ALKPHOS 41 07/25/2022 1246   AST 36 07/25/2022 1246   ALT 15 07/25/2022 1246   BILITOT 0.5 07/25/2022 1246

## 2022-07-25 NOTE — Assessment & Plan Note (Signed)
I have reviewed imaging study with her sister Overall, her clinical exam emanation today is even better compared to my last exam His chest wall is completely dry and the area of healthy granulation tissue measure approximately 2-1/2 inches across in diameter My major concern is due to her malignant cachexia, noncompliance with dietary management recommendation and acute renal failure seen today from poor oral intake We discussed importance of frequent small meals and adequate hydration I will see her again next month for further follow-up

## 2022-07-25 NOTE — Assessment & Plan Note (Signed)
She has signs of acute renal failure due to poor oral intake We discussed risk of dialysis in the future if her renal function gets worse

## 2022-07-25 NOTE — Assessment & Plan Note (Signed)
She has lost more weight despite appetite stimulant According to the patient, she has difficulties obtaining food from her chef However, her sister and mother had stocked her house with food but yet the patient is not consuming the food provided She also have multiple cases of nutritional supplement but only takes 2 ensures per day despite multiple counseling sessions recommending more oral intake as tolerated

## 2022-07-31 ENCOUNTER — Other Ambulatory Visit (HOSPITAL_COMMUNITY): Payer: Self-pay

## 2022-07-31 ENCOUNTER — Other Ambulatory Visit: Payer: Self-pay | Admitting: Hematology and Oncology

## 2022-07-31 MED ORDER — HYDROMORPHONE HCL 8 MG PO TABS
8.0000 mg | ORAL_TABLET | Freq: Four times a day (QID) | ORAL | 0 refills | Status: DC | PRN
  Filled 2022-07-31: qty 90, 23d supply, fill #0

## 2022-08-01 ENCOUNTER — Telehealth: Payer: Self-pay

## 2022-08-01 NOTE — Telephone Encounter (Signed)
Returned call to mother. She is concerned about Dae. She can barely hold her head up now. She is just eating small amounts and drinking very little. She is unable to get to the kitchen from upstairs due to not being able to walk down the steps. She is having a lot of trouble walking. Instructed Mom to take Eunice to the ER to be evaluated now. [name] verbalized understanding and will try to encourage Taylyn to go to the ER.  Tried to call [place] x 2. No answer. Unable to leave a message and voicemail full.

## 2022-08-02 ENCOUNTER — Other Ambulatory Visit: Payer: Self-pay

## 2022-08-02 ENCOUNTER — Encounter (HOSPITAL_COMMUNITY): Payer: Self-pay | Admitting: *Deleted

## 2022-08-02 ENCOUNTER — Inpatient Hospital Stay (HOSPITAL_COMMUNITY)
Admission: EM | Admit: 2022-08-02 | Discharge: 2022-08-30 | DRG: 597 | Disposition: A | Discharge status: Skilled Nursing Facility | Payer: BC Managed Care – PPO | Attending: Internal Medicine | Admitting: Internal Medicine

## 2022-08-02 ENCOUNTER — Emergency Department (HOSPITAL_COMMUNITY): Payer: BC Managed Care – PPO

## 2022-08-02 DIAGNOSIS — J90 Pleural effusion, not elsewhere classified: Secondary | ICD-10-CM | POA: Diagnosis present

## 2022-08-02 DIAGNOSIS — J869 Pyothorax without fistula: Secondary | ICD-10-CM | POA: Insufficient documentation

## 2022-08-02 DIAGNOSIS — J9601 Acute respiratory failure with hypoxia: Secondary | ICD-10-CM | POA: Diagnosis present

## 2022-08-02 DIAGNOSIS — C50911 Malignant neoplasm of unspecified site of right female breast: Principal | ICD-10-CM | POA: Diagnosis present

## 2022-08-02 DIAGNOSIS — K5900 Constipation, unspecified: Secondary | ICD-10-CM

## 2022-08-02 DIAGNOSIS — C7951 Secondary malignant neoplasm of bone: Secondary | ICD-10-CM | POA: Diagnosis present

## 2022-08-02 DIAGNOSIS — A4101 Sepsis due to Methicillin susceptible Staphylococcus aureus: Secondary | ICD-10-CM | POA: Diagnosis not present

## 2022-08-02 DIAGNOSIS — R5381 Other malaise: Secondary | ICD-10-CM | POA: Diagnosis present

## 2022-08-02 DIAGNOSIS — J9602 Acute respiratory failure with hypercapnia: Secondary | ICD-10-CM | POA: Diagnosis present

## 2022-08-02 DIAGNOSIS — R4589 Other symptoms and signs involving emotional state: Secondary | ICD-10-CM

## 2022-08-02 DIAGNOSIS — R001 Bradycardia, unspecified: Secondary | ICD-10-CM | POA: Diagnosis not present

## 2022-08-02 DIAGNOSIS — Z91199 Patient's noncompliance with other medical treatment and regimen due to unspecified reason: Secondary | ICD-10-CM

## 2022-08-02 DIAGNOSIS — I4892 Unspecified atrial flutter: Secondary | ICD-10-CM | POA: Diagnosis not present

## 2022-08-02 DIAGNOSIS — Z9221 Personal history of antineoplastic chemotherapy: Secondary | ICD-10-CM

## 2022-08-02 DIAGNOSIS — Z4682 Encounter for fitting and adjustment of non-vascular catheter: Secondary | ICD-10-CM

## 2022-08-02 DIAGNOSIS — G893 Neoplasm related pain (acute) (chronic): Secondary | ICD-10-CM | POA: Diagnosis not present

## 2022-08-02 DIAGNOSIS — Z5329 Procedure and treatment not carried out because of patient's decision for other reasons: Secondary | ICD-10-CM | POA: Diagnosis not present

## 2022-08-02 DIAGNOSIS — T402X5A Adverse effect of other opioids, initial encounter: Secondary | ICD-10-CM | POA: Diagnosis not present

## 2022-08-02 DIAGNOSIS — E46 Unspecified protein-calorie malnutrition: Secondary | ICD-10-CM | POA: Diagnosis not present

## 2022-08-02 DIAGNOSIS — Z681 Body mass index (BMI) 19 or less, adult: Secondary | ICD-10-CM

## 2022-08-02 DIAGNOSIS — G9349 Other encephalopathy: Secondary | ICD-10-CM | POA: Diagnosis not present

## 2022-08-02 DIAGNOSIS — F32A Depression, unspecified: Secondary | ICD-10-CM | POA: Diagnosis present

## 2022-08-02 DIAGNOSIS — D630 Anemia in neoplastic disease: Secondary | ICD-10-CM | POA: Diagnosis present

## 2022-08-02 DIAGNOSIS — R627 Adult failure to thrive: Secondary | ICD-10-CM | POA: Diagnosis present

## 2022-08-02 DIAGNOSIS — M4854XA Collapsed vertebra, not elsewhere classified, thoracic region, initial encounter for fracture: Secondary | ICD-10-CM | POA: Diagnosis present

## 2022-08-02 DIAGNOSIS — Z66 Do not resuscitate: Secondary | ICD-10-CM | POA: Diagnosis not present

## 2022-08-02 DIAGNOSIS — I4891 Unspecified atrial fibrillation: Secondary | ICD-10-CM | POA: Diagnosis not present

## 2022-08-02 DIAGNOSIS — Z515 Encounter for palliative care: Secondary | ICD-10-CM

## 2022-08-02 DIAGNOSIS — Z79899 Other long term (current) drug therapy: Secondary | ICD-10-CM

## 2022-08-02 DIAGNOSIS — C7802 Secondary malignant neoplasm of left lung: Secondary | ICD-10-CM | POA: Diagnosis present

## 2022-08-02 DIAGNOSIS — Z1152 Encounter for screening for COVID-19: Secondary | ICD-10-CM

## 2022-08-02 DIAGNOSIS — I10 Essential (primary) hypertension: Secondary | ICD-10-CM | POA: Diagnosis present

## 2022-08-02 DIAGNOSIS — A4901 Methicillin susceptible Staphylococcus aureus infection, unspecified site: Secondary | ICD-10-CM

## 2022-08-02 DIAGNOSIS — E43 Unspecified severe protein-calorie malnutrition: Secondary | ICD-10-CM | POA: Diagnosis not present

## 2022-08-02 DIAGNOSIS — J910 Malignant pleural effusion: Secondary | ICD-10-CM

## 2022-08-02 DIAGNOSIS — Z79811 Long term (current) use of aromatase inhibitors: Secondary | ICD-10-CM

## 2022-08-02 DIAGNOSIS — C50919 Malignant neoplasm of unspecified site of unspecified female breast: Secondary | ICD-10-CM | POA: Diagnosis present

## 2022-08-02 DIAGNOSIS — I959 Hypotension, unspecified: Secondary | ICD-10-CM | POA: Diagnosis not present

## 2022-08-02 DIAGNOSIS — R64 Cachexia: Secondary | ICD-10-CM | POA: Diagnosis present

## 2022-08-02 DIAGNOSIS — J948 Other specified pleural conditions: Secondary | ICD-10-CM | POA: Diagnosis present

## 2022-08-02 DIAGNOSIS — Z8249 Family history of ischemic heart disease and other diseases of the circulatory system: Secondary | ICD-10-CM

## 2022-08-02 DIAGNOSIS — R6521 Severe sepsis with septic shock: Secondary | ICD-10-CM | POA: Diagnosis not present

## 2022-08-02 DIAGNOSIS — R451 Restlessness and agitation: Secondary | ICD-10-CM | POA: Diagnosis not present

## 2022-08-02 LAB — COMPREHENSIVE METABOLIC PANEL
ALT: 28 U/L (ref 0–44)
AST: 44 U/L — ABNORMAL HIGH (ref 15–41)
Albumin: 2.4 g/dL — ABNORMAL LOW (ref 3.5–5.0)
Alkaline Phosphatase: 52 U/L (ref 38–126)
Anion gap: 7 (ref 5–15)
BUN: 19 mg/dL (ref 8–23)
CO2: 27 mmol/L (ref 22–32)
Calcium: 7 mg/dL — ABNORMAL LOW (ref 8.9–10.3)
Chloride: 102 mmol/L (ref 98–111)
Creatinine, Ser: 0.7 mg/dL (ref 0.44–1.00)
GFR, Estimated: >60 mL/min (ref >60)
Glucose, Bld: 107 mg/dL — ABNORMAL HIGH (ref 70–99)
Potassium: 3.5 mmol/L (ref 3.5–5.1)
Sodium: 136 mmol/L (ref 135–145)
Total Bilirubin: 0.7 mg/dL (ref 0.3–1.2)
Total Protein: 6.3 g/dL — ABNORMAL LOW (ref 6.5–8.1)

## 2022-08-02 LAB — CBC
HCT: 29.3 % — ABNORMAL LOW (ref 36.0–46.0)
Hemoglobin: 9.5 g/dL — ABNORMAL LOW (ref 12.0–15.0)
MCH: 26.4 pg (ref 26.0–34.0)
MCHC: 32.4 g/dL (ref 30.0–36.0)
MCV: 81.4 fL (ref 80.0–100.0)
Platelets: 433 10*3/uL — ABNORMAL HIGH (ref 150–400)
RBC: 3.6 MIL/uL — ABNORMAL LOW (ref 3.87–5.11)
RDW: 16.8 % — ABNORMAL HIGH (ref 11.5–15.5)
WBC: 11.2 10*3/uL — ABNORMAL HIGH (ref 4.0–10.5)
nRBC: 0 % (ref 0.0–0.2)

## 2022-08-02 LAB — URINALYSIS, ROUTINE W REFLEX MICROSCOPIC
Bacteria, UA: NONE SEEN
Bilirubin Urine: NEGATIVE
Glucose, UA: NEGATIVE mg/dL
Hgb urine dipstick: NEGATIVE
Ketones, ur: NEGATIVE mg/dL
Leukocytes,Ua: NEGATIVE
Nitrite: NEGATIVE
Protein, ur: NEGATIVE mg/dL
Specific Gravity, Urine: 1.019 (ref 1.005–1.030)
pH: 6 (ref 5.0–8.0)

## 2022-08-02 LAB — SARS CORONAVIRUS 2 BY RT PCR: SARS Coronavirus 2 by RT PCR: NEGATIVE

## 2022-08-02 MED ORDER — ENOXAPARIN SODIUM 40 MG/0.4ML IJ SOSY
40.0000 mg | PREFILLED_SYRINGE | INTRAMUSCULAR | Status: DC
  Administered 2022-08-02 – 2022-08-04 (×3): 40 mg via SUBCUTANEOUS
  Filled 2022-08-02 (×3): qty 0.4

## 2022-08-02 MED ORDER — SODIUM CHLORIDE 0.9 % IV BOLUS (SEPSIS)
1000.0000 mL | Freq: Once | INTRAVENOUS | Status: AC
  Administered 2022-08-02: 1000 mL via INTRAVENOUS

## 2022-08-02 MED ORDER — SENNA 8.6 MG PO TABS
2.0000 | ORAL_TABLET | Freq: Two times a day (BID) | ORAL | Status: DC
  Administered 2022-08-03 – 2022-08-27 (×9): 17.2 mg via ORAL
  Filled 2022-08-02 (×33): qty 2

## 2022-08-02 MED ORDER — SODIUM CHLORIDE 0.9% FLUSH: 3.0000 mL | INTRAVENOUS | Status: DC | PRN

## 2022-08-02 MED ORDER — ANASTROZOLE 1 MG PO TABS
1.0000 mg | ORAL_TABLET | Freq: Every day | ORAL | Status: DC
  Administered 2022-08-03 – 2022-08-30 (×27): 1 mg via ORAL
  Filled 2022-08-02 (×28): qty 1

## 2022-08-02 MED ORDER — FAMOTIDINE 20 MG PO TABS
20.0000 mg | ORAL_TABLET | Freq: Two times a day (BID) | ORAL | Status: DC
  Administered 2022-08-03 – 2022-08-04 (×2): 20 mg via ORAL
  Filled 2022-08-02 (×5): qty 1

## 2022-08-02 MED ORDER — POLYETHYLENE GLYCOL 3350 17 G PO PACK
17.0000 g | PACK | Freq: Every day | ORAL | Status: DC
  Administered 2022-08-03 – 2022-08-27 (×8): 17 g via ORAL
  Filled 2022-08-02 (×17): qty 1

## 2022-08-02 MED ORDER — LACTATED RINGERS IV SOLN: INTRAVENOUS | Status: DC

## 2022-08-02 MED ORDER — SODIUM CHLORIDE 0.9% FLUSH
3.0000 mL | Freq: Two times a day (BID) | INTRAVENOUS | Status: DC
  Administered 2022-08-02 – 2022-08-28 (×47): 3 mL via INTRAVENOUS

## 2022-08-02 MED ORDER — ACETAMINOPHEN 650 MG RE SUPP: 650.0000 mg | Freq: Four times a day (QID) | RECTAL | Status: DC | PRN

## 2022-08-02 MED ORDER — ACETAMINOPHEN 325 MG PO TABS
650.0000 mg | ORAL_TABLET | Freq: Four times a day (QID) | ORAL | Status: DC | PRN
  Administered 2022-08-06 – 2022-08-26 (×4): 650 mg via ORAL
  Filled 2022-08-02 (×5): qty 2

## 2022-08-02 MED ORDER — HYDROMORPHONE HCL 2 MG PO TABS
8.0000 mg | ORAL_TABLET | Freq: Four times a day (QID) | ORAL | Status: DC | PRN
  Administered 2022-08-02 – 2022-08-04 (×5): 8 mg via ORAL
  Filled 2022-08-02 (×5): qty 4

## 2022-08-02 MED ORDER — BISACODYL 5 MG PO TBEC
5.0000 mg | DELAYED_RELEASE_TABLET | Freq: Every day | ORAL | Status: DC | PRN
  Administered 2022-08-13: 5 mg via ORAL
  Filled 2022-08-02: qty 1

## 2022-08-02 MED ORDER — SODIUM CHLORIDE 0.9 % IV SOLN
1000.0000 mL | INTRAVENOUS | Status: DC
  Administered 2022-08-02: 1000 mL via INTRAVENOUS

## 2022-08-02 MED ORDER — MIRTAZAPINE 15 MG PO TABS
7.5000 mg | ORAL_TABLET | Freq: Every day | ORAL | Status: DC
  Administered 2022-08-06 – 2022-08-29 (×23): 7.5 mg via ORAL
  Filled 2022-08-02 (×27): qty 1

## 2022-08-02 MED ORDER — ABEMACICLIB 100 MG PO TABS: 100.0000 mg | ORAL_TABLET | Freq: Two times a day (BID) | ORAL | Status: DC

## 2022-08-02 MED ORDER — SODIUM CHLORIDE 0.9 % IV SOLN: 250.0000 mL | INTRAVENOUS | Status: DC | PRN

## 2022-08-02 MED ORDER — MEGESTROL ACETATE 40 MG PO TABS
40.0000 mg | ORAL_TABLET | Freq: Every day | ORAL | Status: DC
  Administered 2022-08-03 – 2022-08-04 (×2): 40 mg via ORAL
  Filled 2022-08-02 (×3): qty 1

## 2022-08-02 MED ORDER — MORPHINE SULFATE (PF) 4 MG/ML IV SOLN
4.0000 mg | Freq: Once | INTRAVENOUS | Status: AC
  Administered 2022-08-02: 4 mg via INTRAVENOUS
  Filled 2022-08-02: qty 1

## 2022-08-02 MED ORDER — CALCIUM CARBONATE ANTACID 500 MG PO CHEW
1.0000 | CHEWABLE_TABLET | Freq: Two times a day (BID) | ORAL | Status: DC
  Administered 2022-08-03 – 2022-08-30 (×54): 200 mg via ORAL
  Filled 2022-08-02 (×57): qty 1

## 2022-08-02 NOTE — H&P (Signed)
History and Physical    [name] NWG:[id number] DOB: 1960/11/28 DOA: 08/02/2022  PCP: Heath Lark, MD   Patient coming from: Home   Chief Complaint: Fatigue, loss of appetite, general weakness, increased SOB    HPI: [name] is a 61 y.o. female with medical history significant for cancer of the right breast, chronic cancer-related pain, and malnutrition who presents to the emergency department with progressive fatigue, generalized weakness, and loss of appetite.   She denies cough, fever, chills, or leg swelling.  ED Course: Upon arrival to the ED, patient is found to be afebrile and saturating mid 90s on room air.  Blood work notable for albumin 2.4, WBC 11,200, platelets 433,000, and hemoglobin 9.5.  Chest x-ray concerning for increased right pleural effusion with complete opacification of the right hemithorax.  She was given a liter of saline and 4 mg IV morphine in the ED.  Review of Systems:  All other systems reviewed and apart from HPI, are negative.  Past Medical History:  Diagnosis Date   Hypertension     Past Surgical History:  Procedure Laterality Date   ANKLE SURGERY     IR THORACENTESIS ASP PLEURAL SPACE W/IMG GUIDE  04/10/2022   IR US GUIDE BX ASP/DRAIN  03/24/2022    Social History:   reports that she has never smoked. She has never used smokeless tobacco. She reports that she does not drink alcohol and does not use drugs.  No Known Allergies  Family History  Problem Relation Age of Onset   Hypertension Mother    Heart disease Father    Stroke Father    Kidney disease Father    Hypertension Sister    Hypertension Maternal Grandmother    Stroke Maternal Grandfather    Diabetes Paternal Grandmother    Cancer Cousin      Prior to Admission medications   Medication Sig Start Date End Date Taking? Authorizing Provider  abemaciclib (VERZENIO) 100 MG tablet Take 1 tablet (100 mg total) by mouth 2 (two) times daily. 04/08/22  Yes Gorsuch, Ni, MD   anastrozole (ARIMIDEX) 1 MG tablet Take 1 tablet (1 mg total) by mouth daily. 04/08/22  Yes Gorsuch, Ni, MD  calcium carbonate (TUMS - DOSED IN MG ELEMENTAL CALCIUM) 500 MG chewable tablet Chew 1 tablet by mouth 2 (two) times daily.   Yes [provider]  cholecalciferol (VITAMIN D3) 25 MCG (1000 UNIT) tablet Take 2,000 Units by mouth daily.   Yes [provider]  famotidine (PEPCID) 20 MG tablet Take 1 tablet (20 mg total) by mouth 2 (two) times daily. 05/30/22  Yes Gorsuch, Ni, MD  HYDROmorphone (DILAUDID) 8 MG tablet Take 1 tablet (8 mg total) by mouth every 6 (six) hours as needed for moderate pain. 07/31/22  Yes Gorsuch, Ni, MD  magnesium oxide (MAG-OX) 400 (240 Mg) MG tablet Take 1 tablet (400 mg total) by mouth daily. 06/12/22  Yes Gorsuch, Ni, MD  megestrol (MEGACE) 40 MG tablet Take 40 mg by mouth daily.   Yes [provider]  mirtazapine (REMERON) 7.5 MG tablet Take 1 tablet (7.5 mg total) by mouth at bedtime. 05/30/22  Yes Gorsuch, Ni, MD  polyethylene glycol (MIRALAX / GLYCOLAX) 17 g packet Take 17 g by mouth daily.   Yes [provider]  senna (SENOKOT) 8.6 MG tablet Take 2 tablets by mouth 2 (two) times daily.   Yes [provider]  metroNIDAZOLE (METROGEL) 1 % gel Apply topically daily. Patient not taking: Reported on 08/02/2022  03/29/22   Larene Pickett, PA-C    Physical Exam: Vitals:   08/02/22 2100 08/02/22 2115 08/02/22 2130 08/02/22 2145  BP: 122/64 93/80 108/67 (!) 101/58  Pulse: 93 99 97 95  Resp:      Temp:      TempSrc:      SpO2: 96% 100% 99% 98%  Weight:        Constitutional: NAD, calm  Eyes: PERTLA, lids and conjunctivae normal ENMT: Mucous membranes are moist. Posterior pharynx clear of any exudate or lesions.   Neck: supple, no masses  Respiratory: Diminished breath sounds, no wheezing. No accessory muscle use.  Cardiovascular: S1 & S2 heard, regular rate and rhythm. No extremity edema.   Abdomen: No distension, no  tenderness, soft. Bowel sounds active.  Musculoskeletal: no clubbing / cyanosis. No joint deformity upper and lower extremities.   Skin: Ulceration and deformity to right chest. Warm, dry, well-perfused. Neurologic: CN 2-12 grossly intact. Moving all extremities. Alert and oriented.  Psychiatric: Pleasant. Cooperative.    Labs and Imaging on Admission: I have personally reviewed following labs and imaging studies  CBC: Recent Labs  Lab 08/02/22 1900  WBC 11.2*  HGB 9.5*  HCT 29.3*  MCV 81.4  PLT 993*   Basic Metabolic Panel: Recent Labs  Lab 08/02/22 1900  NA 136  K 3.5  CL 102  CO2 27  GLUCOSE 107*  BUN 19  CREATININE 0.70  CALCIUM 7.0*   GFR: Estimated Creatinine Clearance: 60.1 mL/min (by C-G formula based on SCr of 0.7 mg/dL). Liver Function Tests: Recent Labs  Lab 08/02/22 1900  AST 44*  ALT 28  ALKPHOS 52  BILITOT 0.7  PROT 6.3*  ALBUMIN 2.4*   No results for input(s): "LIPASE", "AMYLASE" in the last 168 hours. No results for input(s): "AMMONIA" in the last 168 hours. Coagulation Profile: No results for input(s): "INR", "PROTIME" in the last 168 hours. Cardiac Enzymes: No results for input(s): "CKTOTAL", "CKMB", "CKMBINDEX", "TROPONINI" in the last 168 hours. BNP (last 3 results) No results for input(s): "PROBNP" in the last 8760 hours. HbA1C: No results for input(s): "HGBA1C" in the last 72 hours. CBG: No results for input(s): "GLUCAP" in the last 168 hours. Lipid Profile: No results for input(s): "CHOL", "HDL", "LDLCALC", "TRIG", "CHOLHDL", "LDLDIRECT" in the last 72 hours. Thyroid Function Tests: No results for input(s): "TSH", "T4TOTAL", "FREET4", "T3FREE", "THYROIDAB" in the last 72 hours. Anemia Panel: No results for input(s): "VITAMINB12", "FOLATE", "FERRITIN", "TIBC", "IRON", "RETICCTPCT" in the last 72 hours. Urine analysis:    Component Value Date/Time   COLORURINE YELLOW 08/02/2022 1927   APPEARANCEUR CLEAR 08/02/2022 1927   LABSPEC  1.019 08/02/2022 1927   PHURINE 6.0 08/02/2022 1927   GLUCOSEU NEGATIVE 08/02/2022 1927   HGBUR NEGATIVE 08/02/2022 1927   BILIRUBINUR NEGATIVE 08/02/2022 1927   KETONESUR NEGATIVE 08/02/2022 1927   PROTEINUR NEGATIVE 08/02/2022 1927   NITRITE NEGATIVE 08/02/2022 1927   LEUKOCYTESUR NEGATIVE 08/02/2022 1927   Sepsis Labs: '@LABRCNTIP'$ (procalcitonin:4,lacticidven:4) ) Recent Results (from the past 240 hour(s))  SARS Coronavirus 2 by RT PCR (hospital order, performed in [place] hospital lab) *cepheid single result test* Anterior Nasal Swab     Status: None   Collection Time: 08/02/22  5:51 PM   Specimen: Anterior Nasal Swab  Result Value Ref Range Status   SARS Coronavirus 2 by RT PCR NEGATIVE NEGATIVE Final    Comment: (NOTE) SARS-CoV-2 target nucleic acids are NOT DETECTED.  The SARS-CoV-2 RNA is generally detectable in upper and  lower respiratory specimens during the acute phase of infection. The lowest concentration of SARS-CoV-2 viral copies this assay can detect is 250 copies / mL. A negative result does not preclude SARS-CoV-2 infection and should not be used as the sole basis for treatment or other patient management decisions.  A negative result may occur with improper specimen collection / handling, submission of specimen other than nasopharyngeal swab, presence of viral mutation(s) within the areas targeted by this assay, and inadequate number of viral copies (<250 copies / mL). A negative result must be combined with clinical observations, patient history, and epidemiological information.  Fact Sheet for Patients:   https://www.patel.info/  Fact Sheet for Healthcare Providers: https://hall.com/  This test is not yet approved or  cleared by the [place] FDA and has been authorized for detection and/or diagnosis of SARS-CoV-2 by FDA under an Emergency Use Authorization (EUA).  This EUA will remain in effect (meaning  this test can be used) for the duration of the COVID-19 declaration under Section 564(b)(1) of the Act, 21 U.S.C. section 360bbb-3(b)(1), unless the authorization is terminated or revoked sooner.  Performed at [facility], [place] [address] [place], [place] [address]      Radiological Exams on Admission: DG Chest Portable 1 View  Result Date: 08/02/2022 CLINICAL DATA:  Weakness and breast cancer.  Dehydration.  Fatigue. EXAM: PORTABLE CHEST 1 VIEW COMPARISON:  Chest CT 06/25/2022 FINDINGS: Increased right pleural effusion with complete opacification of the right hemithorax. There is leftward shift of the trachea and mediastinal structures. No aerated right lung is seen. No focal airspace disease in the left lung. Left lung nodules on prior CT are not seen by radiograph. No visible pneumothorax. Osseous metastatic disease on CT not well demonstrated. IMPRESSION: Increased right pleural effusion with complete opacification of the right hemithorax. No aerated right lung is seen. Leftward shift of the trachea and mediastinal structures. Electronically Signed   By: Keith Rake M.D.   On: 08/02/2022 16:53     Assessment/Plan   1. Recurrent right pleural effusion  - Stable respiratory status and hemodynamics despite complete opacification of right hemithorax  - Likely malignant  - Consult IR for thoracentesis   2. Breast cancer  - Followed by Dr. Alvy Bimler and managed with Verzenio and Arimidex    3. Chronic cancer-related pain  - Continue oral Dilaudid as needed    4. Anemia  - Appears stable with no overt bleeding  - Monitor, transfuse as needed   8. Debility  - Progressive general weakness and fatigue over the past few weeks, now having difficulty ambulating about her home  - Consult PT    DVT prophylaxis: Lovenox  Code Status: Full  Level of Care: Level of care: Progressive Family Communication: none present   Disposition Plan:  Patient is from: Home   Anticipated d/c is to: TBD Anticipated d/c date is: Possibly as early as 11/5 or 08/04/22  Patient currently: pending thoracentesis, PT eval  Consults called: none  Admission status: Observation     Vianne Bulls, MD [facility]  08/02/2022, 10:31 PM

## 2022-08-02 NOTE — ED Notes (Signed)
ED TO INPATIENT HANDOFF REPORT  ED Nurse Name and Phone #: Raylene Carmickle RN   S Name/Age/Gender [name] 61 y.o. female Room/Bed: WA18/WA18  Code Status   Code Status: Prior  Home/SNF/Other Home Patient oriented to: self, place, time, and situation Is this baseline? Yes   Triage Complete: Triage complete  Chief Complaint Pleural effusion on right [J90]  Triage Note [place] pt: BIB family from home for dehydration, fatigue, nausea, body pain, decreased appetite. Pt of Dr. Alvy Bimler. Ongoing for 3 weeks, getting worse. Denies fever, NVD, syncope.    Allergies No Known Allergies  Level of Care/Admitting Diagnosis ED Disposition     ED Disposition  Admit   Condition  --   Comment  Hospital Area: [facility] [[id number]]  Level of Care: Progressive [102]  Admit to Progressive based on following criteria: MULTISYSTEM THREATS such as stable sepsis, metabolic/electrolyte imbalance with or without encephalopathy that is responding to early treatment.  May place patient in observation at [facility] or [place] if equivalent level of care is available:: Yes  Covid Evaluation: Confirmed COVID Negative  Diagnosis: Pleural effusion on right [333545]  Admitting Physician: Vianne Bulls [[id number]]  Attending Physician: Vianne Bulls [[id number]]          B Medical/Surgery History Past Medical History:  Diagnosis Date   Hypertension    Past Surgical History:  Procedure Laterality Date   ANKLE SURGERY     IR THORACENTESIS ASP PLEURAL SPACE W/IMG GUIDE  04/10/2022   IR US GUIDE BX ASP/DRAIN  03/24/2022     A IV Location/Drains/Wounds Patient Lines/Drains/Airways Status     Active Line/Drains/Airways     Name Placement date Placement time Site Days   Peripheral IV 08/02/22 18 G 1.16" Anterior;Left;Proximal Forearm 08/02/22  1657  Forearm  less than 1   Wound / Incision (Open or Dehisced) 03/23/22 Non-pressure wound Breast Right Open across R breast,  weeping 03/23/22  1000  Breast  132            Intake/Output Last 24 hours  Intake/Output Summary (Last 24 hours) at 08/02/2022 2147 Last data filed at 08/02/2022 2028 Gross per 24 hour  Intake 999 ml  Output --  Net 999 ml    Labs/Imaging Results for orders placed or performed during the hospital encounter of 08/02/22 (from the past 48 hour(s))  SARS Coronavirus 2 by RT PCR (hospital order, performed in [facility] hospital lab) *cepheid single result test* Anterior Nasal Swab     Status: None   Collection Time: 08/02/22  5:51 PM   Specimen: Anterior Nasal Swab  Result Value Ref Range   SARS Coronavirus 2 by RT PCR NEGATIVE NEGATIVE    Comment: (NOTE) SARS-CoV-2 target nucleic acids are NOT DETECTED.  The SARS-CoV-2 RNA is generally detectable in upper and Burch respiratory specimens during the acute phase of infection. The lowest concentration of SARS-CoV-2 viral copies this assay can detect is 250 copies / mL. A negative result does not preclude SARS-CoV-2 infection and should not be used as the sole basis for treatment or other patient management decisions.  A negative result may occur with improper specimen collection / handling, submission of specimen other than nasopharyngeal swab, presence of viral mutation(s) within the areas targeted by this assay, and inadequate number of viral copies (<250 copies / mL). A negative result must be combined with clinical observations, patient history, and epidemiological information.  Fact Sheet for Patients:   https://www.patel.info/  Fact Sheet for Healthcare  Providers: https://hall.com/  This test is not yet approved or  cleared by the [place] and has been authorized for detection and/or diagnosis of SARS-CoV-2 by FDA under an Emergency Use Authorization (EUA).  This EUA will remain in effect (meaning this test can be used) for the duration of the COVID-19 declaration  under Section 564(b)(1) of the Act, 21 U.S.C. section 360bbb-3(b)(1), unless the authorization is terminated or revoked sooner.  Performed at [facility], [place] [address] [place], [place] [address]   CBC     Status: Abnormal   Collection Time: 08/02/22  7:00 PM  Result Value Ref Range   WBC 11.2 (H) 4.0 - 10.5 K/uL   RBC 3.60 (L) 3.87 - 5.11 MIL/uL   Hemoglobin 9.5 (L) 12.0 - 15.0 g/dL   HCT 29.3 (L) 36.0 - 46.0 %   MCV 81.4 80.0 - 100.0 fL   MCH 26.4 26.0 - 34.0 pg   MCHC 32.4 30.0 - 36.0 g/dL   RDW 16.8 (H) 11.5 - 15.5 %   Platelets 433 (H) 150 - 400 K/uL   nRBC 0.0 0.0 - 0.2 %    Comment: Performed at [facility], [place] [address] [place], [place] [address]  Comprehensive metabolic panel     Status: Abnormal   Collection Time: 08/02/22  7:00 PM  Result Value Ref Range   Sodium 136 135 - 145 mmol/L   Potassium 3.5 3.5 - 5.1 mmol/L   Chloride 102 98 - 111 mmol/L   CO2 27 22 - 32 mmol/L   Glucose, Bld 107 (H) 70 - 99 mg/dL    Comment: Glucose reference range applies only to samples taken after fasting for at least 8 hours.   BUN 19 8 - 23 mg/dL   Creatinine, Ser 0.70 0.44 - 1.00 mg/dL   Calcium 7.0 (L) 8.9 - 10.3 mg/dL   Total Protein 6.3 (L) 6.5 - 8.1 g/dL   Albumin 2.4 (L) 3.5 - 5.0 g/dL   AST 44 (H) 15 - 41 U/L   ALT 28 0 - 44 U/L   Alkaline Phosphatase 52 38 - 126 U/L   Total Bilirubin 0.7 0.3 - 1.2 mg/dL   GFR, Estimated >60 >60 mL/min    Comment: (NOTE) Calculated using the CKD-EPI Creatinine Equation (2021)    Anion gap 7 5 - 15    Comment: Performed at [facility], [place] [address], [place], [place] [address]  Urinalysis, Routine w reflex microscopic Urine, Clean Catch     Status: None   Collection Time: 08/02/22  7:27 PM  Result Value Ref Range   Color, Urine YELLOW YELLOW   APPearance CLEAR CLEAR   Specific Gravity, Urine 1.019 1.005 - 1.030   pH 6.0 5.0 - 8.0   Glucose, UA NEGATIVE NEGATIVE mg/dL    Hgb urine dipstick NEGATIVE NEGATIVE   Bilirubin Urine NEGATIVE NEGATIVE   Ketones, ur NEGATIVE NEGATIVE mg/dL   Protein, ur NEGATIVE NEGATIVE mg/dL   Nitrite NEGATIVE NEGATIVE   Leukocytes,Ua NEGATIVE NEGATIVE   RBC / HPF 0-5 0 - 5 RBC/hpf   WBC, UA 6-10 0 - 5 WBC/hpf   Bacteria, UA NONE SEEN NONE SEEN   Squamous Epithelial / LPF 0-5 0 - 5    Comment: Performed at [facility], [place] [address] [place],  [address]   DG Chest Portable 1 View  Result Date: 08/02/2022 CLINICAL DATA:  Weakness and breast cancer.  Dehydration.  Fatigue. EXAM: PORTABLE  CHEST 1 VIEW COMPARISON:  Chest CT 06/25/2022 FINDINGS: Increased right pleural effusion with complete opacification of the right hemithorax. There is leftward shift of the trachea and mediastinal structures. No aerated right lung is seen. No focal airspace disease in the left lung. Left lung nodules on prior CT are not seen by radiograph. No visible pneumothorax. Osseous metastatic disease on CT not well demonstrated. IMPRESSION: Increased right pleural effusion with complete opacification of the right hemithorax. No aerated right lung is seen. Leftward shift of the trachea and mediastinal structures. Electronically Signed   By: Keith Rake M.D.   On: 08/02/2022 16:53    Pending Labs Unresulted Labs (From admission, onward)    None       Vitals/Pain Today's Vitals   08/02/22 2015 08/02/22 2027 08/02/22 2045 08/02/22 2115  BP: (!) 85/53  115/65   Pulse: 96  100   Resp:   16   Temp:  99.4 F (37.4 C)    TempSrc:  Oral    SpO2: 100%  98%   Weight:      PainSc:    6     Isolation Precautions Airborne and Contact precautions  Medications Medications  sodium chloride 0.9 % bolus 1,000 mL (0 mLs Intravenous Stopped 08/02/22 2028)    Followed by  0.9 %  sodium chloride infusion (1,000 mLs Intravenous New Bag/Given 08/02/22 1928)  morphine (PF) 4 MG/ML injection 4 mg (4 mg Intravenous Given 08/02/22 2026)     Mobility walks High fall risk   Focused Assessments    R Recommendations: See Admitting Provider Note  Report given to:   Additional Notes:

## 2022-08-02 NOTE — ED Triage Notes (Signed)
CA pt: BIB family from home for dehydration, fatigue, nausea, body pain, decreased appetite. Pt of Dr. Alvy Bimler. Ongoing for 3 weeks, getting worse. Denies fever, NVD, syncope.

## 2022-08-02 NOTE — ED Provider Notes (Signed)
[facility] DEPT Provider Note   CSN: [id number] Arrival date & time: 08/02/22  1535     History  Chief Complaint  Patient presents with   Fatigue    [name] is a 61 y.o. female.  HPI   Patient has a history of hypertension and breast cancer that has extended into the right chest wall and contralateral left breast.  Patient also has history of pleural effusions and pathologic thoracic compression fractures.  Patient presents to the ED for evaluation of persistent decreased appetite decreased p.o. intake increasing weakness.  Patient is currently being treated with oral chemotherapy pills.  Patient did follow-up with her oncologist last week.  They noted the patient was having persistent issues with malignant cachexia.  Patient still has persistent decreased appetite.  She is having increasing weakness making it hard for her to get around and walk.  She does have some shortness of breath but she does not feel that it is severe.  She denies any leg swelling.  No recent falls.  Home Medications Prior to Admission medications   Medication Sig Start Date End Date Taking? Authorizing Provider  abemaciclib (VERZENIO) 100 MG tablet Take 1 tablet (100 mg total) by mouth 2 (two) times daily. 04/08/22  Yes Gorsuch, Ni, MD  anastrozole (ARIMIDEX) 1 MG tablet Take 1 tablet (1 mg total) by mouth daily. 04/08/22  Yes Gorsuch, Ni, MD  calcium carbonate (TUMS - DOSED IN MG ELEMENTAL CALCIUM) 500 MG chewable tablet Chew 1 tablet by mouth 2 (two) times daily.   Yes [provider]  cholecalciferol (VITAMIN D3) 25 MCG (1000 UNIT) tablet Take 2,000 Units by mouth daily.   Yes [provider]  famotidine (PEPCID) 20 MG tablet Take 1 tablet (20 mg total) by mouth 2 (two) times daily. 05/30/22  Yes Gorsuch, Ni, MD  HYDROmorphone (DILAUDID) 8 MG tablet Take 1 tablet (8 mg total) by mouth every 6 (six) hours as needed for moderate pain. 07/31/22  Yes Gorsuch, Ni,  MD  magnesium oxide (MAG-OX) 400 (240 Mg) MG tablet Take 1 tablet (400 mg total) by mouth daily. 06/12/22  Yes Gorsuch, Ni, MD  mirtazapine (REMERON) 7.5 MG tablet Take 1 tablet (7.5 mg total) by mouth at bedtime. 05/30/22  Yes Gorsuch, Ni, MD  polyethylene glycol (MIRALAX / GLYCOLAX) 17 g packet Take 17 g by mouth daily.   Yes [provider]  senna (SENOKOT) 8.6 MG tablet Take 2 tablets by mouth 2 (two) times daily.   Yes [provider]  metroNIDAZOLE (METROGEL) 1 % gel Apply topically daily. 03/29/22   Larene Pickett, PA-C      Allergies    Patient has no known allergies.    Review of Systems   Review of Systems  Physical Exam Updated Vital Signs BP 115/65   Pulse 100   Temp 99.4 F (37.4 C) (Oral)   Resp 16   Wt 52.2 kg   SpO2 98%   BMI 18.23 kg/m  Physical Exam Vitals and nursing note reviewed.  Constitutional:      General: She is not in acute distress.    Appearance: She is cachectic.  HENT:     Head: Normocephalic and atraumatic.     Right Ear: External ear normal.     Left Ear: External ear normal.     Mouth/Throat:     Comments: Mucous membranes dry Eyes:     General: No scleral icterus.       Right eye:  No discharge.        Left eye: No discharge.     Conjunctiva/sclera: Conjunctivae normal.  Neck:     Trachea: No tracheal deviation.  Cardiovascular:     Rate and Rhythm: Normal rate and regular rhythm.  Pulmonary:     Effort: No respiratory distress.     Breath sounds: No stridor. Rales present. No wheezing.  Chest:     Comments: Healing fungating mass to right breast Abdominal:     General: Bowel sounds are normal. There is no distension.     Palpations: Abdomen is soft.     Tenderness: There is no abdominal tenderness. There is no guarding or rebound.  Musculoskeletal:        General: No tenderness or deformity.     Cervical back: Neck supple.  Skin:    General: Skin is warm and dry.     Findings: No rash.  Neurological:      General: No focal deficit present.     Mental Status: She is alert.     Cranial Nerves: No cranial nerve deficit (no facial droop, extraocular movements intact, no slurred speech).     Sensory: No sensory deficit.     Motor: No abnormal muscle tone or seizure activity.     Coordination: Coordination normal.  Psychiatric:        Mood and Affect: Mood normal.     ED Results / Procedures / Treatments   Labs (all labs ordered are listed, but only abnormal results are displayed) Labs Reviewed  CBC - Abnormal; Notable for the following components:      Result Value   WBC 11.2 (*)    RBC 3.60 (*)    Hemoglobin 9.5 (*)    HCT 29.3 (*)    RDW 16.8 (*)    Platelets 433 (*)    All other components within normal limits  COMPREHENSIVE METABOLIC PANEL - Abnormal; Notable for the following components:   Glucose, Bld 107 (*)    Calcium 7.0 (*)    Total Protein 6.3 (*)    Albumin 2.4 (*)    AST 44 (*)    All other components within normal limits  SARS CORONAVIRUS 2 BY RT PCR  URINALYSIS, ROUTINE W REFLEX MICROSCOPIC    EKG None  Radiology DG Chest Portable 1 View  Result Date: 08/02/2022 CLINICAL DATA:  Weakness and breast cancer.  Dehydration.  Fatigue. EXAM: PORTABLE CHEST 1 VIEW COMPARISON:  Chest CT 06/25/2022 FINDINGS: Increased right pleural effusion with complete opacification of the right hemithorax. There is leftward shift of the trachea and mediastinal structures. No aerated right lung is seen. No focal airspace disease in the left lung. Left lung nodules on prior CT are not seen by radiograph. No visible pneumothorax. Osseous metastatic disease on CT not well demonstrated. IMPRESSION: Increased right pleural effusion with complete opacification of the right hemithorax. No aerated right lung is seen. Leftward shift of the trachea and mediastinal structures. Electronically Signed   By: Keith Rake M.D.   On: 08/02/2022 16:53    Procedures Procedures    Medications Ordered  in ED Medications  sodium chloride 0.9 % bolus 1,000 mL (0 mLs Intravenous Stopped 08/02/22 2028)    Followed by  0.9 %  sodium chloride infusion (1,000 mLs Intravenous New Bag/Given 08/02/22 1928)  morphine (PF) 4 MG/ML injection 4 mg (4 mg Intravenous Given 08/02/22 2026)    ED Course/ Medical Decision Making/ A&P Clinical Course as of 08/02/22 2125  Sat Aug 02, 2022  1848 Chest x-ray shows increased right pleural effusion with complete opacification of the right hemithorax [JK]  2004 Comprehensive metabolic panel(!) Metabolic panel without acute findings other than decreased protein and calcium.  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 Decision-making details documented in ED Course. Radiology: ordered and independent interpretation performed.  Risk Prescription drug management.   Patient presented to the ED for evaluation of weakness, decreased p.o. intake.  Patient has known breast cancer.  She is on oral chemotherapy.  Patient's labs do not show any signs of severe dehydration.  Her anemia is stable.  X-ray should however show significantly increased pleural effusion with near  opacification of the right hemithorax.  Patient fortunately is not having any respiratory difficulty.  We will plan on admission to the hospital.  Patient will need thoracentesis.  Patient also concerned about her ability to care for herself at home.        Final Clinical Impression(s) / ED Diagnoses Final diagnoses:  Malignant pleural effusion  Severe protein-calorie malnutrition ([place])  Primary malignant neoplasm of right breast [facility])    Rx / DC Orders ED Discharge Orders     None         Dorie Rank, MD 08/02/22 2125

## 2022-08-03 ENCOUNTER — Observation Stay (HOSPITAL_COMMUNITY): Payer: BC Managed Care – PPO

## 2022-08-03 DIAGNOSIS — J90 Pleural effusion, not elsewhere classified: Secondary | ICD-10-CM | POA: Diagnosis not present

## 2022-08-03 DIAGNOSIS — E43 Unspecified severe protein-calorie malnutrition: Secondary | ICD-10-CM

## 2022-08-03 DIAGNOSIS — J910 Malignant pleural effusion: Secondary | ICD-10-CM

## 2022-08-03 DIAGNOSIS — C50911 Malignant neoplasm of unspecified site of right female breast: Secondary | ICD-10-CM

## 2022-08-03 LAB — BODY FLUID CELL COUNT WITH DIFFERENTIAL
Lymphs, Fluid: 10 %
Monocyte-Macrophage-Serous Fluid: 6 % — ABNORMAL LOW (ref 50–90)
Neutrophil Count, Fluid: 84 % — ABNORMAL HIGH (ref 0–25)
Total Nucleated Cell Count, Fluid: 7170 cu mm — ABNORMAL HIGH (ref 0–1000)

## 2022-08-03 LAB — LACTATE DEHYDROGENASE, PLEURAL OR PERITONEAL FLUID: LD, Fluid: 439 U/L — ABNORMAL HIGH (ref 3–23)

## 2022-08-03 LAB — CBC
HCT: 35.5 % — ABNORMAL LOW (ref 36.0–46.0)
Hemoglobin: 10.9 g/dL — ABNORMAL LOW (ref 12.0–15.0)
MCH: 26 pg (ref 26.0–34.0)
MCHC: 30.7 g/dL (ref 30.0–36.0)
MCV: 84.5 fL (ref 80.0–100.0)
Platelets: 497 10*3/uL — ABNORMAL HIGH (ref 150–400)
RBC: 4.2 MIL/uL (ref 3.87–5.11)
RDW: 16.9 % — ABNORMAL HIGH (ref 11.5–15.5)
WBC: 11.8 10*3/uL — ABNORMAL HIGH (ref 4.0–10.5)
nRBC: 0 % (ref 0.0–0.2)

## 2022-08-03 LAB — COMPREHENSIVE METABOLIC PANEL
ALT: 29 U/L (ref 0–44)
AST: 27 U/L (ref 15–41)
Albumin: 2.6 g/dL — ABNORMAL LOW (ref 3.5–5.0)
Alkaline Phosphatase: 53 U/L (ref 38–126)
Anion gap: 5 (ref 5–15)
BUN: 15 mg/dL (ref 8–23)
CO2: 27 mmol/L (ref 22–32)
Calcium: 7.1 mg/dL — ABNORMAL LOW (ref 8.9–10.3)
Chloride: 100 mmol/L (ref 98–111)
Creatinine, Ser: 0.71 mg/dL (ref 0.44–1.00)
GFR, Estimated: >60 mL/min (ref >60)
Glucose, Bld: 130 mg/dL — ABNORMAL HIGH (ref 70–99)
Potassium: 3.7 mmol/L (ref 3.5–5.1)
Sodium: 132 mmol/L — ABNORMAL LOW (ref 135–145)
Total Bilirubin: 0.4 mg/dL (ref 0.3–1.2)
Total Protein: 6.7 g/dL (ref 6.5–8.1)

## 2022-08-03 LAB — GRAM STAIN: Gram Stain: NONE SEEN

## 2022-08-03 LAB — PROTEIN, PLEURAL OR PERITONEAL FLUID: Total protein, fluid: 5.1 g/dL

## 2022-08-03 LAB — GLUCOSE, PLEURAL OR PERITONEAL FLUID: Glucose, Fluid: 49 mg/dL

## 2022-08-03 LAB — MAGNESIUM: Magnesium: 2.2 mg/dL (ref 1.7–2.4)

## 2022-08-03 LAB — ALBUMIN, PLEURAL OR PERITONEAL FLUID: Albumin, Fluid: 2.2 g/dL

## 2022-08-03 LAB — LACTATE DEHYDROGENASE: LDH: 174 U/L (ref 98–192)

## 2022-08-03 MED ORDER — METOPROLOL TARTRATE 5 MG/5ML IV SOLN
2.5000 mg | INTRAVENOUS | Status: AC
  Administered 2022-08-03: 2.5 mg via INTRAVENOUS
  Filled 2022-08-03: qty 5

## 2022-08-03 MED ORDER — METRONIDAZOLE 0.75 % EX GEL
Freq: Every day | CUTANEOUS | Status: AC
  Administered 2022-08-14: 1 via TOPICAL
  Filled 2022-08-03: qty 45

## 2022-08-03 MED ORDER — LACTATED RINGERS IV BOLUS
500.0000 mL | Freq: Once | INTRAVENOUS | Status: AC
  Administered 2022-08-03: 500 mL via INTRAVENOUS

## 2022-08-03 MED ORDER — LIDOCAINE HCL 1 % IJ SOLN
INTRAMUSCULAR | Status: AC
  Filled 2022-08-03: qty 20

## 2022-08-03 NOTE — Progress Notes (Signed)
   08/03/22 0323  Assess: MEWS Score  Temp (!) 97.5 F (36.4 C)  BP 114/68  MAP (mmHg) 82  Pulse Rate (!) 150  Resp 18  SpO2 96 %  O2 Device Nasal Cannula  O2 Flow Rate (L/min) 2 L/min  Assess: MEWS Score  MEWS Temp 0  MEWS Systolic 0  MEWS Pulse 3  MEWS RR 0  MEWS LOC 0  MEWS Score 3  MEWS Score Color Yellow  Treat  Pain Scale 0-10  Pain Score 0  Take Vital Signs  Increase Vital Sign Frequency  Yellow: Q 2hr X 2 then Q 4hr X 2, if remains yellow, continue Q 4hrs  Escalate  MEWS: Escalate Yellow: discuss with charge nurse/RN and consider discussing with provider and RRT  Notify: Charge Nurse/RN  Name of Charge Nurse/RN Notified Hilda RN  Date Charge Nurse/RN Notified 08/03/22  Time Charge Nurse/RN Notified 0330  Notify: Provider  Provider Name/Title Abigail NP  Date Provider Notified 08/03/22  Time Provider Notified 0330  Method of Notification Page  Notification Reason Change in status  Provider response At bedside  Date of Provider Response 08/03/22  Time of Provider Response 0330  Document  Patient Outcome Stabilized after interventions  Assess: SIRS CRITERIA  SIRS Temperature  0  SIRS Pulse 1  SIRS Respirations  0  SIRS WBC 1  SIRS Score Sum  2   Pt' HR went up to 140-150 sustaining with AFIB from STAT EKG. Pt asymptomatic. NP Abigail came to bedside and ordered 2.'5mg'$  metoprolol IV with 538m LR bolus.   Heart rhythm convert back to NSR with HR at 70s after intervention. NP Abigail at bedside.

## 2022-08-03 NOTE — Progress Notes (Addendum)
       CROSS COVER NOTE  NAME: [name] MRN: [id number] DOB : 07-12-1961    Date of Service   08/03/2022   HPI/Events of Note   Notified by bedside RN of HR 130-140.  Patient was admitted for fatigue, loss of appetite, generalized weakness, increased shortness of breath.  EKG reviewed, HR 144 in atrial fibrillation with RVR.  During bedside assessment patient is alert and oriented. She denies any chest pain,  palpitations, dizziness, nausea, or vomiting. She denies SOB, but is on 2L [place] and has recurrent right pleural effusion. Diminished breath sounds with no accessory muscle use.  No edema noted.  Patient does appear dry and endorses poor oral intake.  She received 2 L IVF in the ED.  Patient was successfully treated with 2.5 mg IV metoprolol.  Within minutes she was able to go back into sinus rhythm with HR in the 80s.  Additional 500 cc LR bolus to be given as well.  RN has been asked to monitor for additional changes.   Interventions/ Plan   IV metoprolol LR bolus       Raenette Rover, DNP, [place]

## 2022-08-03 NOTE — Progress Notes (Signed)
PT Cancellation Note  Patient Details Name: [name] MRN: [id number] DOB: Apr 05, 1961   Cancelled Treatment:    Reason Eval/Treat Not Completed: Medical issues which prohibited therapy Pt with pending thoracentesis today.  RN reports pt not breathing well currently and recommends checking back after procedure.  Will check back as schedule permits.   Myrtis Hopping [place] 08/03/2022, 9:59 AM Jannette Spanner PT, DPT Physical Therapist Acute Rehabilitation Services Preferred contact method: Secure Chat Weekend Pager Only: [phone] Office: [phone]

## 2022-08-03 NOTE — Plan of Care (Signed)
Discuss and review plan of care with patient/family  

## 2022-08-03 NOTE — Consult Note (Signed)
[facility] Nurse Consult Note: Reason for Consult:Right breast lesion. Patient is known to our service from previous admission. Wound type:neoplastic Pressure Injury POA: N/A Measurement: TO be measured by Bedside RN and documented on Nursing Flow Sheet with first dressing change today Wound EMV:VKPQAE weeping, small to scant amount  Drainage (amount, consistency, odor) As noted above Periwound:with contraction and scarring Dressing procedure/placement/frequency: I will provide guidance to the topical care of this lesion using metronidazole gel applied to xeroform gauze and placed over lesion once daily. This  is to be topped with dry gauze and ABD pads and secured. May use mesh briefs with the center cut to form a "tube top" or circular elastic dressing to secure or use roll gauze to encircle chest and secure with paper tape.   Recommend consultation with oncology to determine if other topical care is indicated, preferred.  If you agree, please arrange consult.  [place] nursing team will not follow, but will remain available to this patient, the nursing and medical teams.  Please re-consult if needed.  Thank you for inviting [place] to participate in this patient's Plan of Care.  Maudie Flakes, MSN, RN, CNS, [place], Serita Grammes, [employer], [employer] phone:  [phone]

## 2022-08-03 NOTE — Progress Notes (Signed)
PROGRESS NOTE  [name]  IWP:[id number] DOB: 07-14-1961 DOA: 08/02/2022 PCP: Heath Lark, MD   Brief Narrative:  Patient is a 61 year old female with history of right breast cancer, chronic right pleural effusion, chronic cancer related pain, malnutrition who presented from home with complaints of fatigue, generalized weakness, loss of appetite.  On presentation she was afebrile.  Lab work showed WBC count of 11.2, hemoglobin 9.5.  Chest x-ray concerning for increased right pleural effusion with complete opacification of the right hemithorax.  Plan for right-sided thoracentesis   Assessment & Plan:  Principal Problem:   Pleural effusion on right Active Problems:   Primary breast malignancy (HCC)   Cancer associated pain   Protein calorie malnutrition (HCC)   Recurrent right-sided pleural effusion: Secondary to breast cancer.  Respiratory status stable, saturating fine on room air, hemodynamically stable.  Chest x-ray showed complete opacification of right hemithorax, Leftward shift of the trachea and mediastinal structures. likely related to malignancy.  Thoracentesis ordered.  Breast cancer: Followed by Dr. Alvy Bimler.  On Verzenio and Arimidex  Chronic cancer rated pain: Continue oral Dilaudid as needed  Normocytic anemia: Currently hemoglobin stable.  Most likely associated with malignancy  Debility: Having weakness, fatigue over past few weeks, difficulty in ambulation.  PT consulted          DVT prophylaxis:enoxaparin (LOVENOX) injection 40 mg Start: 08/02/22 2300     Code Status: Full Code  Family Communication: None at bedside  Patient status:Inpatient  Patient is from :Home  Anticipated discharge NK:NLZJ  Estimated DC date:tomorrow   Consultants: None  Procedures:None  Antimicrobials:  Anti-infectives (From admission, onward)    None       Subjective: Patient seen and examined at bedside this morning.  She was on 2 L of oxygen when I  evaluated.  She does not complain of dyspnea but looked slightly tachypneic during my evaluation.  She is waiting for thoracentesis.  Denies any chest pain  Objective: Vitals:   08/03/22 0323 08/03/22 0350 08/03/22 0438 08/03/22 0604  BP: 114/68 106/67  107/66  Pulse: (!) 150 84  95  Resp: '18 18  16  '$ Temp: (!) 97.5 F (36.4 C) 98.4 F (36.9 C)  98 F (36.7 C)  TempSrc: Oral Oral  Oral  SpO2: 96% 100%  93%  Weight:   52 kg   Height:        Intake/Output Summary (Last 24 hours) at 08/03/2022 0751 Last data filed at 08/03/2022 0438 Gross per 24 hour  Intake 1239 ml  Output --  Net 1239 ml   Filed Weights   08/02/22 1605 08/02/22 2330 08/03/22 0438  Weight: 52.2 kg 55 kg 52 kg    Examination:  General exam: Overall comfortable, not in distress, very pleasant female HEENT: PERRL Respiratory system: No air sounds on the right side Cardiovascular system: S1 & S2 heard, RRR.  Gastrointestinal system: Abdomen is nondistended, soft and nontender. Central nervous system: Alert and oriented Extremities: No edema, no clubbing ,no cyanosis Skin: No rashes, no ulcers,no icterus     Data Reviewed: I have personally reviewed following labs and imaging studies  CBC: Recent Labs  Lab 08/02/22 1900 08/03/22 0705  WBC 11.2* 11.8*  HGB 9.5* 10.9*  HCT 29.3* 35.5*  MCV 81.4 84.5  PLT 433* 673*   Basic Metabolic Panel: Recent Labs  Lab 08/02/22 1900  NA 136  K 3.5  CL 102  CO2 27  GLUCOSE 107*  BUN 19  CREATININE 0.70  CALCIUM 7.0*  Recent Results (from the past 240 hour(s))  SARS Coronavirus 2 by RT PCR (hospital order, performed in [facility] hospital lab) *cepheid single result test* Anterior Nasal Swab     Status: None   Collection Time: 08/02/22  5:51 PM   Specimen: Anterior Nasal Swab  Result Value Ref Range Status   SARS Coronavirus 2 by RT PCR NEGATIVE NEGATIVE Final    Comment: (NOTE) SARS-CoV-2 target nucleic acids are NOT DETECTED.  The SARS-CoV-2  RNA is generally detectable in upper and lower respiratory specimens during the acute phase of infection. The lowest concentration of SARS-CoV-2 viral copies this assay can detect is 250 copies / mL. A negative result does not preclude SARS-CoV-2 infection and should not be used as the sole basis for treatment or other patient management decisions.  A negative result may occur with improper specimen collection / handling, submission of specimen other than nasopharyngeal swab, presence of viral mutation(s) within the areas targeted by this assay, and inadequate number of viral copies (<250 copies / mL). A negative result must be combined with clinical observations, patient history, and epidemiological information.  Fact Sheet for Patients:   https://www.patel.info/  Fact Sheet for Healthcare Providers: https://hall.com/  This test is not yet approved or  cleared by the [place] FDA and has been authorized for detection and/or diagnosis of SARS-CoV-2 by FDA under an Emergency Use Authorization (EUA).  This EUA will remain in effect (meaning this test can be used) for the duration of the COVID-19 declaration under Section 564(b)(1) of the Act, 21 U.S.C. section 360bbb-3(b)(1), unless the authorization is terminated or revoked sooner.  Performed at [facility], [place] [address] [place], [place] [address]      Radiology Studies: DG Chest Portable 1 View  Result Date: 08/02/2022 CLINICAL DATA:  Weakness and breast cancer.  Dehydration.  Fatigue. EXAM: PORTABLE CHEST 1 VIEW COMPARISON:  Chest CT 06/25/2022 FINDINGS: Increased right pleural effusion with complete opacification of the right hemithorax. There is leftward shift of the trachea and mediastinal structures. No aerated right lung is seen. No focal airspace disease in the left lung. Left lung nodules on prior CT are not seen by radiograph. No visible pneumothorax.  Osseous metastatic disease on CT not well demonstrated. IMPRESSION: Increased right pleural effusion with complete opacification of the right hemithorax. No aerated right lung is seen. Leftward shift of the trachea and mediastinal structures. Electronically Signed   By: Keith Rake M.D.   On: 08/02/2022 16:53    Scheduled Meds:  abemaciclib  100 mg Oral BID   anastrozole  1 mg Oral Daily   calcium carbonate  1 tablet Oral BID   enoxaparin (LOVENOX) injection  40 mg Subcutaneous Q24H   famotidine  20 mg Oral BID   megestrol  40 mg Oral Daily   metroNIDAZOLE   Topical Q1400   mirtazapine  7.5 mg Oral QHS   polyethylene glycol  17 g Oral Daily   senna  2 tablet Oral BID   sodium chloride flush  3 mL Intravenous Q12H   Continuous Infusions:  sodium chloride     lactated ringers 75 mL/hr at 08/03/22 0605     LOS: 0 days   Shelly Coss, MD [facility] P11/01/2022, 7:51 AM

## 2022-08-03 NOTE — Procedures (Signed)
PROCEDURE SUMMARY:  Successful US guided right thoracentesis. Yielded 800 mL of yellow fluid. Pt tolerated procedure well. No immediate complications.  Specimen was sent for labs. CXR ordered.  EBL < 5 mL  Docia Barrier PA-C 08/03/2022 11:42 AM

## 2022-08-04 ENCOUNTER — Other Ambulatory Visit (HOSPITAL_COMMUNITY): Payer: Self-pay

## 2022-08-04 DIAGNOSIS — J948 Other specified pleural conditions: Secondary | ICD-10-CM | POA: Diagnosis not present

## 2022-08-04 DIAGNOSIS — R9431 Abnormal electrocardiogram [ECG] [EKG]: Secondary | ICD-10-CM | POA: Diagnosis not present

## 2022-08-04 DIAGNOSIS — A419 Sepsis, unspecified organism: Secondary | ICD-10-CM | POA: Diagnosis not present

## 2022-08-04 DIAGNOSIS — Z515 Encounter for palliative care: Secondary | ICD-10-CM | POA: Diagnosis not present

## 2022-08-04 DIAGNOSIS — M4854XA Collapsed vertebra, not elsewhere classified, thoracic region, initial encounter for fracture: Secondary | ICD-10-CM | POA: Diagnosis present

## 2022-08-04 DIAGNOSIS — I4891 Unspecified atrial fibrillation: Secondary | ICD-10-CM | POA: Diagnosis not present

## 2022-08-04 DIAGNOSIS — J9601 Acute respiratory failure with hypoxia: Secondary | ICD-10-CM | POA: Diagnosis not present

## 2022-08-04 DIAGNOSIS — I10 Essential (primary) hypertension: Secondary | ICD-10-CM | POA: Diagnosis present

## 2022-08-04 DIAGNOSIS — A4901 Methicillin susceptible Staphylococcus aureus infection, unspecified site: Secondary | ICD-10-CM | POA: Diagnosis not present

## 2022-08-04 DIAGNOSIS — C50911 Malignant neoplasm of unspecified site of right female breast: Secondary | ICD-10-CM | POA: Diagnosis not present

## 2022-08-04 DIAGNOSIS — Z66 Do not resuscitate: Secondary | ICD-10-CM | POA: Diagnosis not present

## 2022-08-04 DIAGNOSIS — R5381 Other malaise: Secondary | ICD-10-CM | POA: Diagnosis not present

## 2022-08-04 DIAGNOSIS — R609 Edema, unspecified: Secondary | ICD-10-CM | POA: Diagnosis not present

## 2022-08-04 DIAGNOSIS — I4892 Unspecified atrial flutter: Secondary | ICD-10-CM | POA: Diagnosis not present

## 2022-08-04 DIAGNOSIS — J910 Malignant pleural effusion: Secondary | ICD-10-CM | POA: Diagnosis present

## 2022-08-04 DIAGNOSIS — D630 Anemia in neoplastic disease: Secondary | ICD-10-CM | POA: Diagnosis present

## 2022-08-04 DIAGNOSIS — J90 Pleural effusion, not elsewhere classified: Secondary | ICD-10-CM | POA: Diagnosis present

## 2022-08-04 DIAGNOSIS — E43 Unspecified severe protein-calorie malnutrition: Secondary | ICD-10-CM | POA: Diagnosis not present

## 2022-08-04 DIAGNOSIS — C50919 Malignant neoplasm of unspecified site of unspecified female breast: Secondary | ICD-10-CM | POA: Diagnosis not present

## 2022-08-04 DIAGNOSIS — Z681 Body mass index (BMI) 19 or less, adult: Secondary | ICD-10-CM | POA: Diagnosis not present

## 2022-08-04 DIAGNOSIS — C7802 Secondary malignant neoplasm of left lung: Secondary | ICD-10-CM | POA: Diagnosis present

## 2022-08-04 DIAGNOSIS — R001 Bradycardia, unspecified: Secondary | ICD-10-CM | POA: Diagnosis not present

## 2022-08-04 DIAGNOSIS — G9349 Other encephalopathy: Secondary | ICD-10-CM | POA: Diagnosis not present

## 2022-08-04 DIAGNOSIS — J869 Pyothorax without fistula: Secondary | ICD-10-CM | POA: Diagnosis not present

## 2022-08-04 DIAGNOSIS — Z4682 Encounter for fitting and adjustment of non-vascular catheter: Secondary | ICD-10-CM | POA: Diagnosis not present

## 2022-08-04 DIAGNOSIS — I959 Hypotension, unspecified: Secondary | ICD-10-CM | POA: Diagnosis not present

## 2022-08-04 DIAGNOSIS — C7951 Secondary malignant neoplasm of bone: Secondary | ICD-10-CM | POA: Diagnosis present

## 2022-08-04 DIAGNOSIS — C7800 Secondary malignant neoplasm of unspecified lung: Secondary | ICD-10-CM | POA: Diagnosis not present

## 2022-08-04 DIAGNOSIS — R64 Cachexia: Secondary | ICD-10-CM | POA: Diagnosis not present

## 2022-08-04 DIAGNOSIS — R6521 Severe sepsis with septic shock: Secondary | ICD-10-CM | POA: Diagnosis not present

## 2022-08-04 DIAGNOSIS — C7981 Secondary malignant neoplasm of breast: Secondary | ICD-10-CM | POA: Diagnosis not present

## 2022-08-04 DIAGNOSIS — A4101 Sepsis due to Methicillin susceptible Staphylococcus aureus: Secondary | ICD-10-CM | POA: Diagnosis not present

## 2022-08-04 DIAGNOSIS — Z1152 Encounter for screening for COVID-19: Secondary | ICD-10-CM | POA: Diagnosis not present

## 2022-08-04 DIAGNOSIS — F32A Depression, unspecified: Secondary | ICD-10-CM | POA: Diagnosis present

## 2022-08-04 DIAGNOSIS — J9602 Acute respiratory failure with hypercapnia: Secondary | ICD-10-CM | POA: Diagnosis not present

## 2022-08-04 LAB — GLUCOSE, CAPILLARY: Glucose-Capillary: 124 mg/dL — ABNORMAL HIGH (ref 70–99)

## 2022-08-04 MED ORDER — METOPROLOL TARTRATE 5 MG/5ML IV SOLN
2.5000 mg | Freq: Once | INTRAVENOUS | Status: AC | PRN
  Administered 2022-08-04: 2.5 mg via INTRAVENOUS
  Filled 2022-08-04: qty 5

## 2022-08-04 MED ORDER — ORAL CARE MOUTH RINSE: 15.0000 mL | OROMUCOSAL | Status: DC

## 2022-08-04 MED ORDER — METOPROLOL TARTRATE 5 MG/5ML IV SOLN
2.5000 mg | Freq: Once | INTRAVENOUS | Status: AC
  Administered 2022-08-04: 2.5 mg via INTRAVENOUS
  Filled 2022-08-04: qty 5

## 2022-08-04 MED ORDER — NALOXONE HCL 0.4 MG/ML IJ SOLN
0.1000 mg | INTRAMUSCULAR | Status: AC | PRN
  Administered 2022-08-05: 0.1 mg via INTRAVENOUS
  Filled 2022-08-04: qty 1

## 2022-08-04 MED ORDER — NALOXONE HCL 0.4 MG/ML IJ SOLN: 0.1000 mg | Freq: Once | INTRAMUSCULAR | Status: DC

## 2022-08-04 MED ORDER — ORAL CARE MOUTH RINSE: 15.0000 mL | OROMUCOSAL | Status: DC | PRN

## 2022-08-04 MED ORDER — ORAL CARE MOUTH RINSE
15.0000 mL | OROMUCOSAL | Status: DC
  Administered 2022-08-05: 15 mL via OROMUCOSAL

## 2022-08-04 MED ORDER — SODIUM CHLORIDE 0.9 % IV BOLUS: 500.0000 mL | Freq: Once | INTRAVENOUS | Status: AC

## 2022-08-04 MED ORDER — SODIUM CHLORIDE 0.9 % IV BOLUS
250.0000 mL | Freq: Once | INTRAVENOUS | Status: AC
  Administered 2022-08-04: 250 mL via INTRAVENOUS

## 2022-08-04 MED ORDER — HYDROMORPHONE HCL 1 MG/ML IJ SOLN
0.5000 mg | INTRAMUSCULAR | Status: DC | PRN
  Administered 2022-08-05 – 2022-08-12 (×20): 0.5 mg via INTRAVENOUS
  Filled 2022-08-04 (×20): qty 1

## 2022-08-04 MED ORDER — SODIUM CHLORIDE 0.9 % IV BOLUS
500.0000 mL | Freq: Once | INTRAVENOUS | Status: AC
  Administered 2022-08-04: 500 mL via INTRAVENOUS

## 2022-08-04 MED ORDER — LEVALBUTEROL HCL 0.63 MG/3ML IN NEBU
INHALATION_SOLUTION | RESPIRATORY_TRACT | Status: AC
  Administered 2022-08-04: 0.63 mg via RESPIRATORY_TRACT
  Filled 2022-08-04: qty 3

## 2022-08-04 MED ORDER — LACTATED RINGERS IV BOLUS: 500.0000 mL | Freq: Once | INTRAVENOUS | Status: DC

## 2022-08-04 MED ORDER — CHLORHEXIDINE GLUCONATE CLOTH 2 % EX PADS
6.0000 | MEDICATED_PAD | Freq: Every day | CUTANEOUS | Status: DC
  Administered 2022-08-04 – 2022-08-14 (×11): 6 via TOPICAL

## 2022-08-04 MED ORDER — NALOXONE HCL 0.4 MG/ML IJ SOLN
INTRAMUSCULAR | Status: AC
  Administered 2022-08-04: 0.1 mg
  Filled 2022-08-04: qty 1

## 2022-08-04 MED ORDER — SODIUM CHLORIDE 0.9 % IV BOLUS: 500.0000 mL | Freq: Once | INTRAVENOUS | Status: DC

## 2022-08-04 MED ORDER — MORPHINE SULFATE (PF) 2 MG/ML IV SOLN: 2.0000 mg | INTRAVENOUS | Status: DC | PRN

## 2022-08-04 MED ORDER — MUPIROCIN 2 % EX OINT: 1.0000 | TOPICAL_OINTMENT | Freq: Two times a day (BID) | CUTANEOUS | Status: DC

## 2022-08-04 MED ORDER — LEVALBUTEROL HCL 0.63 MG/3ML IN NEBU
0.6300 mg | INHALATION_SOLUTION | Freq: Four times a day (QID) | RESPIRATORY_TRACT | Status: AC | PRN
  Administered 2022-08-05: 0.63 mg via RESPIRATORY_TRACT
  Filled 2022-08-04: qty 3

## 2022-08-04 NOTE — Telephone Encounter (Signed)
I saw her in the hospital and will call her

## 2022-08-04 NOTE — Progress Notes (Signed)
PROGRESS NOTE  [name]  PZW:[id number] DOB: 05/03/1961 DOA: 08/02/2022 PCP: Heath Lark, MD   Brief Narrative:  Patient is a 61 year old female with history of right breast cancer, chronic right pleural effusion, chronic cancer related pain, malnutrition who presented from home with complaints of fatigue, generalized weakness, loss of appetite.  On presentation, she was afebrile.  Lab work showed WBC count of 11.2, hemoglobin 9.5.  Chest x-ray concerning for increased right pleural effusion with complete opacification of the right hemithorax.  Underwent  right-sided thoracentesis on 11/5, but only 800 mL was removed and the procedure was stopped due to pain.  Plan for repeat paracentesis, PT evaluation   Assessment & Plan:  Principal Problem:   Pleural effusion on right Active Problems:   Primary breast malignancy (HCC)   Cancer associated pain   Protein calorie malnutrition (HCC)   Pleural effusion, right   Recurrent right-sided pleural effusion: Secondary to breast cancer.  Respiratory status stable, saturating fine on room air, hemodynamically stable.  Chest x-ray showed complete opacification of right hemithorax, Leftward shift of the trachea and mediastinal structures. likely related to malignancy.  Thoracentesis ordered on 11/5 and she underwent thoracentesis with removal of 800 mill of fluid but the procedure was stopped due to pain.  Repeat thoracentesis has been ordered.  Transient A-fib: Patient went into A-fibIn the early morning of 11/5 but back to normal sinus rhythm after given 2.5 mg of IV metoprolol.  Seeing and went into transient A-fib this afternoon.  We will continue to monitor in telemetry.CHA2DS2VAsc of 1.W ewill get an Echo  Breast cancer: Followed by Dr. Alvy Bimler.  On Verzenio and Arimidex  Chronic cancer rated pain: Continue oral Dilaudid as needed  Normocytic anemia: Currently hemoglobin stable.  Most likely associated with malignancy  Debility: Having  weakness, fatigue over past few weeks, difficulty in ambulation.  PT consulted,might need SNF          DVT prophylaxis:enoxaparin (LOVENOX) injection 40 mg Start: 08/02/22 2300     Code Status: Full Code  Family Communication: Called and discussed with sister on phone on 11/6  Patient status:Inpatient  Patient is from :Home  Anticipated discharge UM:PNTI  Estimated DC date:1-2 days  Consultants: Oncology  Procedures:Paracentesis  Antimicrobials:  Anti-infectives (From admission, onward)    None       Subjective: Patient seen and examined at bedside today.  On room air.  Remains weak but overall comfortable, not hypoxic or in respite distress.  Objective: Vitals:   08/04/22 0500 08/04/22 0505 08/04/22 1053 08/04/22 1138  BP:  105/66    Pulse:  (!) 103    Resp:  '18 20 18  '$ Temp:  98.3 F (36.8 C)    TempSrc:  Oral    SpO2:  95%    Weight: 52 kg     Height:        Intake/Output Summary (Last 24 hours) at 08/04/2022 1309 Last data filed at 08/03/2022 2000 Gross per 24 hour  Intake 480 ml  Output --  Net 480 ml   Filed Weights   08/02/22 2330 08/03/22 0438 08/04/22 0500  Weight: 55 kg 52 kg 52 kg    Examination:  General exam: Overall comfortable, not in distress HEENT: PERRL Respiratory system:  no wheezes or crackles , significantly diminished air sounds on the right side Cardiovascular system: S1 & S2 heard, RRR.  Gastrointestinal system: Abdomen is nondistended, soft and nontender. Central nervous system: Alert and oriented Extremities: No edema, no clubbing ,no  cyanosis Skin: No rashes, no ulcers,no icterus     Data Reviewed: I have personally reviewed following labs and imaging studies  CBC: Recent Labs  Lab 08/02/22 1900 08/03/22 0705  WBC 11.2* 11.8*  HGB 9.5* 10.9*  HCT 29.3* 35.5*  MCV 81.4 84.5  PLT 433* 510*   Basic Metabolic Panel: Recent Labs  Lab 08/02/22 1900 08/03/22 0705  NA 136 132*  K 3.5 3.7  CL 102 100  CO2  27 27  GLUCOSE 107* 130*  BUN 19 15  CREATININE 0.70 0.71  CALCIUM 7.0* 7.1*  MG  --  2.2     Recent Results (from the past 240 hour(s))  SARS Coronavirus 2 by RT PCR (hospital order, performed in [facility] hospital lab) *cepheid single result test* Anterior Nasal Swab     Status: None   Collection Time: 08/02/22  5:51 PM   Specimen: Anterior Nasal Swab  Result Value Ref Range Status   SARS Coronavirus 2 by RT PCR NEGATIVE NEGATIVE Final    Comment: (NOTE) SARS-CoV-2 target nucleic acids are NOT DETECTED.  The SARS-CoV-2 RNA is generally detectable in upper and lower respiratory specimens during the acute phase of infection. The lowest concentration of SARS-CoV-2 viral copies this assay can detect is 250 copies / mL. A negative result does not preclude SARS-CoV-2 infection and should not be used as the sole basis for treatment or other patient management decisions.  A negative result may occur with improper specimen collection / handling, submission of specimen other than nasopharyngeal swab, presence of viral mutation(s) within the areas targeted by this assay, and inadequate number of viral copies (<250 copies / mL). A negative result must be combined with clinical observations, patient history, and epidemiological information.  Fact Sheet for Patients:   https://www.patel.info/  Fact Sheet for Healthcare Providers: https://hall.com/  This test is not yet approved or  cleared by the [place] FDA and has been authorized for detection and/or diagnosis of SARS-CoV-2 by FDA under an Emergency Use Authorization (EUA).  This EUA will remain in effect (meaning this test can be used) for the duration of the COVID-19 declaration under Section 564(b)(1) of the Act, 21 U.S.C. section 360bbb-3(b)(1), unless the authorization is terminated or revoked sooner.  Performed at [facility], [place] [address], [place], [place] [address]   Gram stain     Status: None   Collection Time: 08/03/22 12:13 PM   Specimen: PATH Cytology Pleural fluid  Result Value Ref Range Status   Specimen Description   Final    FLUID Performed at [place] [address], [place], [place] [address]    Special Requests   Final    NONE Performed at [facility], [place] [address], [place], [place] [address]    Gram Stain   Final    NO ORGANISMS SEEN FEW WBC PRESENT, PREDOMINANTLY PMN Performed at [place] [address] [place],  [address]    Report Status 08/03/2022 FINAL  Final  Culture, body fluid w Gram Stain-bottle     Status: None (Preliminary result)   Collection Time: 08/03/22  6:05 PM   Specimen: Pleura  Result Value Ref Range Status   Specimen Description PLEURAL FLUID  Final   Special Requests   Final    BOTTLES DRAWN AEROBIC AND ANAEROBIC Blood Culture results may not be optimal due to an excessive volume of blood received in culture bottles   Gram Stain  Final    ABUNDANT GRAM POSITIVE COCCI IN CLUSTERS RARE WBC PRESENT, PREDOMINANTLY PMN IN BOTH AEROBIC AND ANAEROBIC BOTTLES Performed at [facility], [place] [address], [place], [place] [address]    Culture PENDING  Incomplete   Report Status PENDING  Incomplete     Radiology Studies: US THORACENTESIS ASP PLEURAL SPACE W/IMG GUIDE  Result Date: 08/03/2022 INDICATION: Patient with history of right-sided breast cancer, right pleural effusion. Request is made for diagnostic and therapeutic thoracentesis. EXAM: ULTRASOUND GUIDED RIGHT THORACENTESIS MEDICATIONS: 10 mL 1% lidocaine COMPLICATIONS: None immediate. PROCEDURE: An ultrasound guided thoracentesis was thoroughly discussed with the patient and questions answered. The benefits, risks, alternatives and complications were also discussed. The patient understands and wishes to proceed with the procedure. Written consent was obtained.  Ultrasound was performed to localize and mark an adequate pocket of fluid in the right chest. The area was then prepped and draped in the normal sterile fashion. 1% Lidocaine was used for local anesthesia. Under ultrasound guidance a 6 Fr Safe-T-Centesis catheter was introduced. Thoracentesis was performed. The catheter was removed and a dressing applied. FINDINGS: A total of approximately 800 mL of yellow fluid was removed. Samples were sent to the laboratory as requested by the clinical team. IMPRESSION: Successful ultrasound guided right thoracentesis yielding 800 mL of pleural fluid. Read by: Brynda Greathouse PA-C Electronically Signed   By: Sandi Mariscal M.D.   On: 08/03/2022 13:06   DG Chest Port 1 View  Result Date: 08/03/2022 CLINICAL DATA:  Post right thoracentesis EXAM: PORTABLE CHEST 1 VIEW COMPARISON:  08/02/2022 FINDINGS: Continued complete opacification of the right hemithorax. No visible pneumothorax. No confluent opacity or effusion on the left. Heart is normal size. IMPRESSION: Continued complete opacification of the right hemithorax. No visible pneumothorax. Electronically Signed   By: Rolm Baptise M.D.   On: 08/03/2022 11:52   DG Chest Portable 1 View  Result Date: 08/02/2022 CLINICAL DATA:  Weakness and breast cancer.  Dehydration.  Fatigue. EXAM: PORTABLE CHEST 1 VIEW COMPARISON:  Chest CT 06/25/2022 FINDINGS: Increased right pleural effusion with complete opacification of the right hemithorax. There is leftward shift of the trachea and mediastinal structures. No aerated right lung is seen. No focal airspace disease in the left lung. Left lung nodules on prior CT are not seen by radiograph. No visible pneumothorax. Osseous metastatic disease on CT not well demonstrated. IMPRESSION: Increased right pleural effusion with complete opacification of the right hemithorax. No aerated right lung is seen. Leftward shift of the trachea and mediastinal structures. Electronically Signed   By: Keith Rake M.D.   On: 08/02/2022 16:53    Scheduled Meds:  anastrozole  1 mg Oral Daily   calcium carbonate  1 tablet Oral BID   enoxaparin (LOVENOX) injection  40 mg Subcutaneous Q24H   famotidine  20 mg Oral BID   megestrol  40 mg Oral Daily   metoprolol tartrate  2.5 mg Intravenous Once   metroNIDAZOLE   Topical Q1400   mirtazapine  7.5 mg Oral QHS   polyethylene glycol  17 g Oral Daily   senna  2 tablet Oral BID   sodium chloride flush  3 mL Intravenous Q12H   Continuous Infusions:  sodium chloride       LOS: 0 days   Shelly Coss, MD [facility] P11/02/2022, 1:09 PM

## 2022-08-04 NOTE — Progress Notes (Signed)
PT Cancellation Note  Patient Details Name: [name] MRN: [id number] DOB: [birth year]   Cancelled Treatment:    Reason Eval/Treat Not Completed: Pain limiting ability to participate Pt requested to eat her jello this morning and upon checking back this afternoon, pt declines to mobilize, stating she has pain.  Pt requesting pain meds and RN notified.  Pt aware PT was ordered to evaluate her and assist with d/c planning and agreeable for PT to check back tomorrow.   Myrtis Hopping [place] 08/04/2022, 2:20 PM Arlyce Dice, DPT Physical Therapist Acute Rehabilitation Services Preferred contact method: Secure Chat Weekend Pager Only: [phone] Office: [phone]

## 2022-08-04 NOTE — TOC Initial Note (Signed)
Transition of Care [facility]) - Initial/Assessment Note    Patient Details  Name: [name] MRN: [id number] Date of Birth: 21-Feb-1961  Transition of Care [facility]) CM/SW Contact:    Leeroy Cha, RN Phone Number: 08/04/2022, 9:55 AM  Clinical Narrative:                  Transition of Care [facility]) Screening Note   Patient Details  Name: [name] Date of Birth: [birth year]   Transition of Care [facility]) CM/SW Contact:    Leeroy Cha, RN Phone Number: 08/04/2022, 9:55 AM    Transition of Care Department [facility]) has reviewed patient and no TOC needs have been identified at this time. We will continue to monitor patient advancement through interdisciplinary progression rounds. If new patient transition needs arise, please place a TOC consult.    Expected Discharge Plan: Home/Self Care Barriers to Discharge: Continued Medical Work up   Patient Goals and CMS Choice Patient states their goals for this hospitalization and ongoing recovery are:: to go home and be well CMS Medicare.gov Compare Post Acute Care list provided to:: Patient Choice offered to / list presented to : Patient  Expected Discharge Plan and Services Expected Discharge Plan: Home/Self Care   Discharge Planning Services: CM Consult   Living arrangements for the past 2 months: Apartment                                      Prior Living Arrangements/Services Living arrangements for the past 2 months: Apartment Lives with:: Self Patient language and need for interpreter reviewed:: Yes Do you feel safe going back to the place where you live?: Yes            Criminal Activity/Legal Involvement Pertinent to Current Situation/Hospitalization: No - Comment as needed  Activities of Daily Living Home Assistive Devices/Equipment: None ADL Screening (condition at time of admission) Patient's cognitive ability adequate to safely complete daily activities?: Yes Is the patient deaf or have  difficulty hearing?: No Does the patient have difficulty seeing, even when wearing glasses/contacts?: No Does the patient have difficulty concentrating, remembering, or making decisions?: No Patient able to express need for assistance with ADLs?: Yes Does the patient have difficulty dressing or bathing?: No Independently performs ADLs?: Yes (appropriate for developmental age) Does the patient have difficulty walking or climbing stairs?: Yes Weakness of Legs: Both Weakness of Arms/Hands: Both  Permission Sought/Granted                  Emotional Assessment Appearance:: Appears stated age Attitude/Demeanor/Rapport: Gracious Affect (typically observed): Calm Orientation: : Oriented to Self, Oriented to Place, Oriented to  Time, Oriented to Situation Alcohol / Substance Use: Never Used Psych Involvement: No (comment)  Admission diagnosis:  Severe protein-calorie malnutrition ([place]) [E43] 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) [C50.911] Patient Active Problem List   Diagnosis Date Noted   Pleural effusion on right 08/02/2022   Protein calorie malnutrition ([place]) 08/02/2022   Goals of care, counseling/discussion 07/25/2022   Vitamin D deficiency 06/12/2022   Situational depression 05/30/2022   GERD (gastroesophageal reflux disease) 05/30/2022   Pancytopenia, acquired ([place]) 05/08/2022   Wound healing, delayed 05/08/2022   Physical debility 04/17/2022   Poor social situation 04/02/2022   Cancer associated pain 03/31/2022   Poor venous access 03/31/2022   Other constipation 03/31/2022   Malnutrition  of moderate degree 03/25/2022   Breast cancer metastasized to bone, right ([place]) 03/23/2022   Primary breast malignancy ([place]) 03/22/2022   Pleural effusion 03/22/2022   Cellulitis 03/22/2022   Microcytic anemia 03/22/2022   Thrombocytosis 03/22/2022   Acute renal failure (ARF) ([place]) 03/22/2022   Lactic  acidosis 03/22/2022   Essential hypertension 01/27/2018   Screening for diabetes mellitus 01/27/2018   Screening for thyroid disorder 01/27/2018   PCP:  Heath Lark, MD Pharmacy:   [place] [address] Phone: [phone] Fax: [phone]     Social Determinants of Health (SDOH) Interventions    Readmission Risk Interventions   No data to display

## 2022-08-04 NOTE — Progress Notes (Signed)
       Overnight   NAME: [name] MRN: [id number] DOB : [birth year]    Date of Service   08/04/2022   HPI/Events of Note    Notified by RN for concern over respiratory status and Heart rate/Blood pressure.  On arrival to room patient was unresponsive to noxious stimuli and had slow and decreasing respiratory rate.  Patient has had scheduled Narcotics.     Interventions/ Plan   Narcan IV Fluid bolus Transfer to SDU due to response to Narcan and continued BP  4. CXR 5. ABG     Addendum to 2159 hrs   Patient had an immediate response to Narcan. She awoke somewhat confused for approximately 30 seconds. Within 2 minutes+/- she is awake conversant and pleasant at baseline.  Currently in SDU due to situation (agonal breathing, hypotensive) and resuscitation with Narcan.       Gershon Cull BSN MSNA MSN ACNPC-AG Acute Care Nurse Practitioner [place]

## 2022-08-04 NOTE — Progress Notes (Signed)
[name]   DOB:[birth year]   SJ#:[id number]    ASSESSMENT & PLAN:  Metastatic breast cancer to the lung and bones While she responded very well to chemotherapy and antiestrogen therapy, she is not thriving due to progressive weight loss and weakness While hospitalized, I recommend we continue on antiestrogen therapy but hold abemaciclib I will discuss plan of care with family I do believe the patient could benefit from skilled nursing facility placement after discharge  Recurrent malignant pleural effusion She has persistent pleural effusion despite thoracentesis Monitor closely Her oxygen saturation is adequate  Malignant cachexia Failure to thrive Severe protein calorie malnutrition This is multifactorial, a component of noncompliance and poor access to food Recommend dietitian review and additional nutritional supplement as tolerated  Generalized weakness Failure to thrive Multifactorial, related to progressive weight loss, muscle loss and deconditioning Recommend skilled nursing facility placement upon discharge  Code Status Full code  Goals of care Improvement of weakness  Discharge planning She can benefit from skilled nursing facility placement I do not believe she is safe to be discharged due to high risk of falls  All questions were answered. The patient knows to call the clinic with any problems, questions or concerns.   The total time spent in the appointment was 55 minutes encounter with patients including review of chart and various tests results, discussions about plan of care and coordination of care plan  Heath Lark, MD 08/04/2022 7:27 AM  Subjective:  The patient was noted to be admitted due to generalized weakness She was seen in the outpatient clinic recently With each visit over the last few months, she has progressive weight loss due to difficulties with compliance and eating despite clinical improvement with cancer control of her disease with  chemotherapy and antiestrogen therapy  Objective:  Vitals:   08/03/22 2201 08/04/22 0505  BP: (!) 113/56 105/66  Pulse: (!) 102 (!) 103  Resp: 16 18  Temp: 98.6 F (37 C) 98.3 F (36.8 C)  SpO2: 92% 95%     Intake/Output Summary (Last 24 hours) at 08/04/2022 0727 Last data filed at 08/03/2022 2000 Gross per 24 hour  Intake 480 ml  Output --  Net 480 ml    GENERAL:alert, no distress and comfortable.  She is thin and cachectic SKIN: skin color, texture, turgor are normal, no rashes or significant lesions EYES: normal, Conjunctiva are pink and non-injected, sclera clear OROPHARYNX:no exudate, no erythema and lips, buccal mucosa, and tongue normal  NECK: supple, thyroid normal size, non-tender, without nodularity LYMPH:  no palpable lymphadenopathy in the cervical, axillary or inguinal LUNGS: Minimal breath sounds throughout the right lung HEART: regular rate & rhythm and no murmurs and no lower extremity edema ABDOMEN:abdomen soft, non-tender and normal bowel sounds Musculoskeletal:no cyanosis of digits and no clubbing  NEURO: alert & oriented x 3 with fluent speech, no focal motor/sensory deficits   Labs:  Recent Labs    07/25/22 1246 08/02/22 1900 08/03/22 0705  NA 138 136 132*  K 4.8 3.5 3.7  CL 100 102 100  CO2 '28 27 27  '$ GLUCOSE 120* 107* 130*  BUN 33* 19 15  CREATININE 2.24* 0.70 0.71  CALCIUM 9.7 7.0* 7.1*  GFRNONAA 24* >60 >60  PROT 8.1 6.3* 6.7  ALBUMIN 3.8 2.4* 2.6*  AST 36 44* 27  ALT '15 28 29  '$ ALKPHOS 41 52 53  BILITOT 0.5 0.7 0.4    Studies: I have reviewed her chest x-ray US THORACENTESIS ASP PLEURAL SPACE W/IMG GUIDE  Result Date: 08/03/2022 INDICATION: Patient with history of right-sided breast cancer, right pleural effusion. Request is made for diagnostic and therapeutic thoracentesis. EXAM: ULTRASOUND GUIDED RIGHT THORACENTESIS MEDICATIONS: 10 mL 1% lidocaine COMPLICATIONS: None immediate. PROCEDURE: An ultrasound guided thoracentesis was  thoroughly discussed with the patient and questions answered. The benefits, risks, alternatives and complications were also discussed. The patient understands and wishes to proceed with the procedure. Written consent was obtained. Ultrasound was performed to localize and mark an adequate pocket of fluid in the right chest. The area was then prepped and draped in the normal sterile fashion. 1% Lidocaine was used for local anesthesia. Under ultrasound guidance a 6 Fr Safe-T-Centesis catheter was introduced. Thoracentesis was performed. The catheter was removed and a dressing applied. FINDINGS: A total of approximately 800 mL of yellow fluid was removed. Samples were sent to the laboratory as requested by the clinical team. IMPRESSION: Successful ultrasound guided right thoracentesis yielding 800 mL of pleural fluid. Read by: Brynda Greathouse PA-C Electronically Signed   By: Sandi Mariscal M.D.   On: 08/03/2022 13:06   DG Chest Port 1 View  Result Date: 08/03/2022 CLINICAL DATA:  Post right thoracentesis EXAM: PORTABLE CHEST 1 VIEW COMPARISON:  08/02/2022 FINDINGS: Continued complete opacification of the right hemithorax. No visible pneumothorax. No confluent opacity or effusion on the left. Heart is normal size. IMPRESSION: Continued complete opacification of the right hemithorax. No visible pneumothorax. Electronically Signed   By: Rolm Baptise M.D.   On: 08/03/2022 11:52   DG Chest Portable 1 View  Result Date: 08/02/2022 CLINICAL DATA:  Weakness and breast cancer.  Dehydration.  Fatigue. EXAM: PORTABLE CHEST 1 VIEW COMPARISON:  Chest CT 06/25/2022 FINDINGS: Increased right pleural effusion with complete opacification of the right hemithorax. There is leftward shift of the trachea and mediastinal structures. No aerated right lung is seen. No focal airspace disease in the left lung. Left lung nodules on prior CT are not seen by radiograph. No visible pneumothorax. Osseous metastatic disease on CT not well  demonstrated. IMPRESSION: Increased right pleural effusion with complete opacification of the right hemithorax. No aerated right lung is seen. Leftward shift of the trachea and mediastinal structures. Electronically Signed   By: Keith Rake M.D.   On: 08/02/2022 16:53

## 2022-08-04 NOTE — Progress Notes (Signed)
       Overnight   NAME: [name] MRN: [id number] DOB : 1961/03/27    Date of Service   08/04/2022   HPI/Events of Note   Notified by RN for Tachycardia as has occurred previously. Transient A-fib.    Interventions/ Plan   IV fluid bolus Lopressor dose as prior. 3. EKG available.      Gershon Cull BSN MSNA MSN ACNPC-AG Acute Care Nurse Practitioner [place]

## 2022-08-05 ENCOUNTER — Inpatient Hospital Stay (HOSPITAL_COMMUNITY): Payer: BC Managed Care – PPO

## 2022-08-05 DIAGNOSIS — Z7189 Other specified counseling: Secondary | ICD-10-CM

## 2022-08-05 DIAGNOSIS — R9431 Abnormal electrocardiogram [ECG] [EKG]: Secondary | ICD-10-CM | POA: Diagnosis not present

## 2022-08-05 DIAGNOSIS — Z515 Encounter for palliative care: Secondary | ICD-10-CM | POA: Diagnosis not present

## 2022-08-05 DIAGNOSIS — J910 Malignant pleural effusion: Principal | ICD-10-CM

## 2022-08-05 DIAGNOSIS — K5900 Constipation, unspecified: Secondary | ICD-10-CM

## 2022-08-05 DIAGNOSIS — R4589 Other symptoms and signs involving emotional state: Secondary | ICD-10-CM

## 2022-08-05 DIAGNOSIS — E43 Unspecified severe protein-calorie malnutrition: Secondary | ICD-10-CM | POA: Diagnosis not present

## 2022-08-05 DIAGNOSIS — C50911 Malignant neoplasm of unspecified site of right female breast: Secondary | ICD-10-CM | POA: Diagnosis not present

## 2022-08-05 DIAGNOSIS — J90 Pleural effusion, not elsewhere classified: Secondary | ICD-10-CM | POA: Diagnosis not present

## 2022-08-05 LAB — COMPREHENSIVE METABOLIC PANEL
ALT: 23 U/L (ref 0–44)
AST: 22 U/L (ref 15–41)
Albumin: 2.4 g/dL — ABNORMAL LOW (ref 3.5–5.0)
Alkaline Phosphatase: 50 U/L (ref 38–126)
Anion gap: 8 (ref 5–15)
BUN: 33 mg/dL — ABNORMAL HIGH (ref 8–23)
CO2: 25 mmol/L (ref 22–32)
Calcium: 7.1 mg/dL — ABNORMAL LOW (ref 8.9–10.3)
Chloride: 103 mmol/L (ref 98–111)
Creatinine, Ser: 1.85 mg/dL — ABNORMAL HIGH (ref 0.44–1.00)
GFR, Estimated: 30 mL/min — ABNORMAL LOW (ref >60)
Glucose, Bld: 129 mg/dL — ABNORMAL HIGH (ref 70–99)
Potassium: 4 mmol/L (ref 3.5–5.1)
Sodium: 136 mmol/L (ref 135–145)
Total Bilirubin: 0.3 mg/dL (ref 0.3–1.2)
Total Protein: 6.3 g/dL — ABNORMAL LOW (ref 6.5–8.1)

## 2022-08-05 LAB — CBC
HCT: 29.8 % — ABNORMAL LOW (ref 36.0–46.0)
Hemoglobin: 9 g/dL — ABNORMAL LOW (ref 12.0–15.0)
MCH: 26 pg (ref 26.0–34.0)
MCHC: 30.2 g/dL (ref 30.0–36.0)
MCV: 86.1 fL (ref 80.0–100.0)
Platelets: 586 10*3/uL — ABNORMAL HIGH (ref 150–400)
RBC: 3.46 MIL/uL — ABNORMAL LOW (ref 3.87–5.11)
RDW: 16.8 % — ABNORMAL HIGH (ref 11.5–15.5)
WBC: 11.8 10*3/uL — ABNORMAL HIGH (ref 4.0–10.5)
nRBC: 0 % (ref 0.0–0.2)

## 2022-08-05 LAB — BLOOD GAS, ARTERIAL
Acid-base deficit: 2.8 mmol/L — ABNORMAL HIGH (ref 0.0–2.0)
Bicarbonate: 25.7 mmol/L (ref 20.0–28.0)
Drawn by: 23281
O2 Content: 4 L/min
O2 Saturation: 99.9 %
Patient temperature: 37
pCO2 arterial: 60 mmHg — ABNORMAL HIGH (ref 32–48)
pH, Arterial: 7.24 — ABNORMAL LOW (ref 7.35–7.45)
pO2, Arterial: 95 mmHg (ref 83–108)

## 2022-08-05 LAB — ECHOCARDIOGRAM COMPLETE
Area-P 1/2: 4.89 cm2
Height: 66 in
S' Lateral: 2.8 cm
Single Plane A4C EF: 67.5 %
Weight: 1897.72 oz

## 2022-08-05 LAB — POCT I-STAT 7, (LYTES, BLD GAS, ICA,H+H)
Acid-Base Excess: 0 mmol/L (ref 0.0–2.0)
Bicarbonate: 28 mmol/L (ref 20.0–28.0)
Calcium, Ion: 1.12 mmol/L — ABNORMAL LOW (ref 1.15–1.40)
HCT: 30 % — ABNORMAL LOW (ref 36.0–46.0)
Hemoglobin: 10.2 g/dL — ABNORMAL LOW (ref 12.0–15.0)
O2 Saturation: 99 %
Patient temperature: 97.7
Potassium: 3.8 mmol/L (ref 3.5–5.1)
Sodium: 133 mmol/L — ABNORMAL LOW (ref 135–145)
TCO2: 30 mmol/L (ref 22–32)
pCO2 arterial: 64.7 mmHg — ABNORMAL HIGH (ref 32–48)
pH, Arterial: 7.242 — ABNORMAL LOW (ref 7.35–7.45)
pO2, Arterial: 164 mmHg — ABNORMAL HIGH (ref 83–108)

## 2022-08-05 LAB — CORTISOL: Cortisol, Plasma: 20.9 ug/dL

## 2022-08-05 LAB — HEMOGLOBIN A1C
Hgb A1c MFr Bld: 5.7 % — ABNORMAL HIGH (ref 4.8–5.6)
Mean Plasma Glucose: 116.89 mg/dL

## 2022-08-05 LAB — LACTIC ACID, PLASMA: Lactic Acid, Venous: 0.6 mmol/L (ref 0.5–1.9)

## 2022-08-05 LAB — PROTIME-INR
INR: 1.4 — ABNORMAL HIGH (ref 0.8–1.2)
Prothrombin Time: 16.6 seconds — ABNORMAL HIGH (ref 11.4–15.2)

## 2022-08-05 LAB — PROCALCITONIN: Procalcitonin: 0.97 ng/mL

## 2022-08-05 LAB — TROPONIN I (HIGH SENSITIVITY)
Troponin I (High Sensitivity): 3 ng/L (ref <18)
Troponin I (High Sensitivity): 4 ng/L (ref <18)

## 2022-08-05 LAB — MAGNESIUM: Magnesium: 2.4 mg/dL (ref 1.7–2.4)

## 2022-08-05 LAB — MRSA NEXT GEN BY PCR, NASAL: MRSA by PCR Next Gen: NOT DETECTED

## 2022-08-05 MED ORDER — VANCOMYCIN HCL 500 MG/100ML IV SOLN: 500.0000 mg | Freq: Two times a day (BID) | INTRAVENOUS | Status: DC

## 2022-08-05 MED ORDER — SODIUM CHLORIDE 0.9 % IV SOLN: INTRAVENOUS | Status: DC

## 2022-08-05 MED ORDER — LACTATED RINGERS IV BOLUS
500.0000 mL | Freq: Once | INTRAVENOUS | Status: AC
  Administered 2022-08-05: 500 mL via INTRAVENOUS

## 2022-08-05 MED ORDER — HYDROCORTISONE SOD SUC (PF) 100 MG IJ SOLR
50.0000 mg | Freq: Four times a day (QID) | INTRAMUSCULAR | Status: DC
  Administered 2022-08-05 – 2022-08-08 (×11): 50 mg via INTRAVENOUS
  Filled 2022-08-05 (×11): qty 2

## 2022-08-05 MED ORDER — NOREPINEPHRINE 4 MG/250ML-% IV SOLN
2.0000 ug/min | INTRAVENOUS | Status: DC
  Administered 2022-08-05: 10 ug/min via INTRAVENOUS
  Administered 2022-08-05: 2 ug/min via INTRAVENOUS
  Filled 2022-08-05 (×2): qty 250

## 2022-08-05 MED ORDER — VANCOMYCIN HCL IN DEXTROSE 1-5 GM/200ML-% IV SOLN: 1000.0000 mg | INTRAVENOUS | Status: DC

## 2022-08-05 MED ORDER — ORAL CARE MOUTH RINSE: 15.0000 mL | OROMUCOSAL | Status: DC | PRN

## 2022-08-05 MED ORDER — SODIUM CHLORIDE 0.9 % IV BOLUS
500.0000 mL | Freq: Once | INTRAVENOUS | Status: AC
  Administered 2022-08-05: 500 mL via INTRAVENOUS

## 2022-08-05 MED ORDER — SODIUM CHLORIDE 0.9 % IV SOLN: INTRAVENOUS | Status: DC | PRN

## 2022-08-05 MED ORDER — NALOXONE HCL 0.4 MG/ML IJ SOLN
0.2000 mg | INTRAMUSCULAR | Status: DC | PRN
  Administered 2022-08-05: 0.2 mg via INTRAVENOUS
  Filled 2022-08-05: qty 1

## 2022-08-05 MED ORDER — SODIUM CHLORIDE 0.9 % IV SOLN
250.0000 mL | INTRAVENOUS | Status: DC
  Administered 2022-08-06 – 2022-08-18 (×3): 250 mL via INTRAVENOUS

## 2022-08-05 MED ORDER — FAMOTIDINE 20 MG PO TABS
20.0000 mg | ORAL_TABLET | Freq: Every day | ORAL | Status: DC
  Administered 2022-08-05 – 2022-08-30 (×23): 20 mg via ORAL
  Filled 2022-08-05 (×25): qty 1

## 2022-08-05 MED ORDER — BOOST / RESOURCE BREEZE PO LIQD CUSTOM
1.0000 | Freq: Three times a day (TID) | ORAL | Status: DC
  Administered 2022-08-05 – 2022-08-06 (×4): 1 via ORAL

## 2022-08-05 MED ORDER — CALCIUM GLUCONATE-NACL 1-0.675 GM/50ML-% IV SOLN
1.0000 g | Freq: Once | INTRAVENOUS | Status: AC
  Administered 2022-08-05: 1000 mg via INTRAVENOUS
  Filled 2022-08-05: qty 50

## 2022-08-05 MED ORDER — NALOXONE HCL 0.4 MG/ML IJ SOLN: 0.1000 mg | INTRAMUSCULAR | Status: DC | PRN

## 2022-08-05 MED ORDER — VASOPRESSIN 20 UNITS/100 ML INFUSION FOR SHOCK
0.0000 [IU]/min | INTRAVENOUS | Status: DC
  Administered 2022-08-05: 0.03 [IU]/min via INTRAVENOUS
  Filled 2022-08-05: qty 100

## 2022-08-05 MED ORDER — VANCOMYCIN HCL IN DEXTROSE 1-5 GM/200ML-% IV SOLN
1000.0000 mg | Freq: Once | INTRAVENOUS | Status: AC
  Administered 2022-08-05: 1000 mg via INTRAVENOUS
  Filled 2022-08-05: qty 200

## 2022-08-05 MED ORDER — LACTATED RINGERS IV BOLUS
1000.0000 mL | Freq: Once | INTRAVENOUS | Status: AC
  Administered 2022-08-05: 1000 mL via INTRAVENOUS

## 2022-08-05 MED ORDER — SODIUM CHLORIDE 0.9 % IV SOLN
2.0000 g | Freq: Three times a day (TID) | INTRAVENOUS | Status: DC
  Administered 2022-08-05: 2 g via INTRAVENOUS
  Filled 2022-08-05: qty 12.5

## 2022-08-05 MED ORDER — LINEZOLID 600 MG/300ML IV SOLN: 600.0000 mg | Freq: Two times a day (BID) | INTRAVENOUS | Status: DC

## 2022-08-05 MED ORDER — METOPROLOL TARTRATE 5 MG/5ML IV SOLN
2.5000 mg | Freq: Once | INTRAVENOUS | Status: AC | PRN
  Administered 2022-08-05: 2.5 mg via INTRAVENOUS
  Filled 2022-08-05: qty 5

## 2022-08-05 MED ORDER — SODIUM CHLORIDE 0.9 % IV SOLN: 2.0000 g | INTRAVENOUS | Status: DC

## 2022-08-05 MED ORDER — STERILE WATER FOR INJECTION IJ SOLN
INTRAMUSCULAR | Status: AC
  Administered 2022-08-05: 10 mL
  Filled 2022-08-05: qty 10

## 2022-08-05 MED ORDER — OLANZAPINE 10 MG IM SOLR
2.5000 mg | Freq: Once | INTRAMUSCULAR | Status: AC | PRN
  Administered 2022-08-05: 2.5 mg via INTRAMUSCULAR
  Filled 2022-08-05: qty 10

## 2022-08-05 MED ORDER — ENOXAPARIN SODIUM 30 MG/0.3ML IJ SOSY
30.0000 mg | PREFILLED_SYRINGE | INTRAMUSCULAR | Status: DC
  Filled 2022-08-05: qty 0.3

## 2022-08-05 MED ORDER — MEGESTROL ACETATE 40 MG PO TABS
40.0000 mg | ORAL_TABLET | Freq: Two times a day (BID) | ORAL | Status: DC
  Administered 2022-08-05 – 2022-08-30 (×41): 40 mg via ORAL
  Filled 2022-08-05 (×52): qty 1

## 2022-08-05 MED ORDER — MIDODRINE HCL 5 MG PO TABS
10.0000 mg | ORAL_TABLET | Freq: Three times a day (TID) | ORAL | Status: DC
  Administered 2022-08-05 – 2022-08-11 (×17): 10 mg via ORAL
  Filled 2022-08-05 (×19): qty 2

## 2022-08-05 NOTE — Progress Notes (Signed)
Pharmacy Antibiotic Note  [name] is a 61 y.o. female admitted on 08/02/2022 with recurrent right pleural effusion, weakness and fatigue.  PMH significant for breast cancer.  Tonight patient tx from floor to ICU due to AMS/respiratory depression and hypotension. Pharmacy has been consulted for Vancomycin and Cefepime dosing for sepsis/pneumonia.  Plan: Cefepime 2gm IV q8h Vancomycin 1gm IV x 1 followed by Vancomycin 500 mg IV Q 12 hrs. Goal AUC 400-550.  Expected  AUC: 462.7  SCr used: 0.8 (rounded up from 0.71) Follow renal function  Height: '5\' 6"'$  (167.6 cm) Weight: 53.8 kg (118 lb 9.7 oz) IBW/kg (Calculated) : 59.3  Temp (24hrs), Avg:98.2 F (36.8 C), Min:97.7 F (36.5 C), Max:98.6 F (37 C)  Recent Labs  Lab 08/02/22 1900 08/03/22 0705  WBC 11.2* 11.8*  CREATININE 0.70 0.71    Estimated Creatinine Clearance: 61.9 mL/min (by C-G formula based on SCr of 0.71 mg/dL).    No Known Allergies  Antimicrobials this admission: 11/7 Cefepime >>   11/7 Vancomycin >>    Dose adjustments this admission:    Microbiology results: 11/5 Pleural fluid Cx: Gram + Cocci 11/6 MRSA PCR: negative  Thank you for allowing pharmacy to be a part of this patient's care.  Everette Rank, PharmD 08/05/2022 3:50 AM

## 2022-08-05 NOTE — Consult Note (Signed)
Consultation Note Date: 08/05/2022   Patient Name: [name]  DOB: 09/04/1961  MRN: [id number]  Age / Sex: 61 y.o., female   PCP: Heath Lark, MD Referring Physician: Brand Males, MD  Reason for Consultation: Establishing goals of care and Symptom Management     Chief Complaint/History of Present Illness:   Patient is a 61 year old woman with a past medical history of metastatic breast cancer and hypertension who was admitted on 08/02/2022 for management of decreased appetite, poor p.o. intake, and lethargy.  During hospitalization patient was found to have a current malignant right-sided pleural effusion which required thoracentesis.  Patient required transfer to the ICU on 11/7 due to becoming unresponsive; patient received Narcan and fluids for management of hypotension and unresponsiveness.  Palliative care consulted to assist with complex medical decision making and symptom management.  Extensive review of EMR prior to seeing patient.  Included review of PDMP for history of opioid prescriptions.  Patient has been on hydromorphone 8 mg tablet since July 2023.  At time of EMR review, in the past 24 hours patient received p.o. Dilaudid 8 mg as needed x2 doses.  Presented to bedside and introduced myself and the role of the palliative care team in patient's care.  Took time to to explore what life would like before the patient prior to being admitted to the hospital.  Patient described how her oncologist has assisted her with getting more in-home care though she is still awaiting for Meals on Wheels.  Patient described how she used to love getting out and about though now she essentially stays at home.  Patient does describe that she has been getting weaker.  Patient admits that she is not eating much at home.  Patient notes she lives by herself though her mother and sister assist with her care at times.  When inquiring about ADLs and IADLs patient seems to have difficulties with  completing all IADLs.  Patient admits that her driving has become incredibly limited.  Patient notes that she cannot remember the medication she is supposed to take at home and does not have a pill organizer to help her manage her medications.  Patient noted that if needed her mother sister can sometimes help her get to appointments.  Inquired what patient was hoping for moving forward and she notes that she wants to regain her strength and hopefully get back out in the community though it has been a while since she has been able to participate in outside activities beyond her house.  Patient noted that if physical therapy recommended rehab, she would be willing to go to rehab to regain her strength prior to going home.  Encouraged patient needing more assistance at home from family.  Inquired about patient's symptom management at this time.  Patient denies any pain at this moment.  Patient is unsure of her last bowel movement and believes it was last week some point.  Inquired who patient would want to make medical decisions for her if she was unable to make medical decisions for herself.  Patient noted that her mother and her sister would work together though if 1 person was to be initially called it would be her mother.  Noted would be best if she could complete advance care planning paperwork while she was hospitalized.  All questions answered at that time.  Thanked patient for allowing me to visit today.  During visit patient was very pleasant and could tell that the further we went into discussions, the more  exhausted she became.  Allowed patient space to rest today.  Patient voiced appreciation for visit.  Primary Diagnoses  Present on Admission:  Pleural effusion on right  Primary breast malignancy (HCC)  Cancer associated pain  Protein calorie malnutrition (HCC)  Pleural effusion, right   Palliative Review of Systems: Patient denied any pain at this time.  Patient noted she might had a  bowel movement last week.  Notes issues with continued constipation.  Past Medical History:  Diagnosis Date   Hypertension    Social History   Socioeconomic History   Marital status: Single    Spouse name: Not on file   Number of children: 0   Years of education: Not on file   Highest education level: Not on file  Occupational History   Occupation: retired Pharmacist, hospital  Tobacco Use   Smoking status: Never   Smokeless tobacco: Never  Substance and Sexual Activity   Alcohol use: No   Drug use: No   Sexual activity: Not on file  Other Topics Concern   Not on file  Social History Narrative   Not on file   Social Determinants of Health   Financial Resource Strain: Low Risk  (04/03/2022)   Overall Financial Resource Strain (CARDIA)    Difficulty of Paying Living Expenses: Not hard at all  Food Insecurity: No Food Insecurity (08/02/2022)   Hunger Vital Sign    Worried About Running Out of Food in the Last Year: Never true    [place] in the Last Year: Never true  Transportation Needs: No Transportation Needs (08/02/2022)   PRAPARE - Hydrologist (Medical): No    Lack of Transportation (Non-Medical): No  Physical Activity: Not on file  Stress: Not on file  Social Connections: Not on file   Family History  Problem Relation Age of Onset   Hypertension Mother    Heart disease Father    Stroke Father    Kidney disease Father    Hypertension Sister    Hypertension Maternal Grandmother    Stroke Maternal Grandfather    Diabetes Paternal Grandmother    Cancer Cousin    Scheduled Meds:  anastrozole  1 mg Oral Daily   calcium carbonate  1 tablet Oral BID   Chlorhexidine Gluconate Cloth  6 each Topical Daily   enoxaparin (LOVENOX) injection  30 mg Subcutaneous Q24H   famotidine  20 mg Oral Daily   hydrocortisone sod succinate (SOLU-CORTEF) inj  50 mg Intravenous Q6H   megestrol  40 mg Oral Daily   metroNIDAZOLE   Topical Q1400   midodrine  10  mg Oral Q8H   mirtazapine  7.5 mg Oral QHS   naLOXone (NARCAN)  injection  0.1 mg Intravenous Once   polyethylene glycol  17 g Oral Daily   senna  2 tablet Oral BID   sodium chloride flush  3 mL Intravenous Q12H   Continuous Infusions:  sodium chloride     sodium chloride     sodium chloride Stopped (08/05/22 0451)   sodium chloride     calcium gluconate     [START ON 08/06/2022] ceFEPime (MAXIPIME) IV     norepinephrine (LEVOPHED) Adult infusion 9 mcg/min (08/05/22 0700)   [START ON 08/07/2022] vancomycin     vasopressin 0.03 Units/min (08/05/22 0700)   PRN Meds:.sodium chloride, Place/Maintain arterial line **AND** sodium chloride, acetaminophen **OR** acetaminophen, bisacodyl, HYDROmorphone (DILAUDID) injection, HYDROmorphone, naLOXone (NARCAN)  injection, mouth rinse, sodium chloride flush No Known  Allergies CBC:    Component Value Date/Time   WBC 11.8 (H) 08/05/2022 0458   HGB 9.0 (L) 08/05/2022 0458   HCT 29.8 (L) 08/05/2022 0458   PLT 586 (H) 08/05/2022 0458   MCV 86.1 08/05/2022 0458   NEUTROABS 3.9 07/25/2022 1246   LYMPHSABS 0.6 (L) 07/25/2022 1246   MONOABS 0.2 07/25/2022 1246   EOSABS 0.0 07/25/2022 1246   BASOSABS 0.0 07/25/2022 1246   Comprehensive Metabolic Panel:    Component Value Date/Time   NA 136 08/05/2022 0458   K 4.0 08/05/2022 0458   CL 103 08/05/2022 0458   CO2 25 08/05/2022 0458   BUN 33 (H) 08/05/2022 0458   CREATININE 1.85 (H) 08/05/2022 0458   GLUCOSE 129 (H) 08/05/2022 0458   CALCIUM 7.1 (L) 08/05/2022 0458   AST 22 08/05/2022 0458   ALT 23 08/05/2022 0458   ALKPHOS 50 08/05/2022 0458   BILITOT 0.3 08/05/2022 0458   PROT 6.3 (L) 08/05/2022 0458   ALBUMIN 2.4 (L) 08/05/2022 0458    Physical Exam: Vital Signs: BP (!) 97/44   Pulse 86   Temp 97.6 F (36.4 C) (Axillary)   Resp (!) 25   Ht '5\' 6"'$  (1.676 m)   Wt 53.8 kg   SpO2 100%   BMI 19.14 kg/m  SpO2: SpO2: 100 % O2 Device: O2 Device: Bi-PAP O2 Flow Rate: O2 Flow Rate (L/min):  4 L/min Intake/output summary:  Intake/Output Summary (Last 24 hours) at 08/05/2022 3382 Last data filed at 08/05/2022 0700 Gross per 24 hour  Intake 1872.8 ml  Output --  Net 1872.8 ml   Baseline Weight: Weight: 52.2 kg Most recent weight: Weight: 53.8 kg  General: NAD, pleasant, laying in bed, chronically ill appearing, frail Eyes: conjunctiva clear, anicteric sclera HENT: moist mucous membranes Cardiovascular: RRR Respiratory: no increased work of breathing noted, not in respiratory distress Abdomen: not distended Extremities: no edema in LE b/l Skin: no rashes or lesions on visible skin Neuro: alert, oriented, did become more fatigued with longer conversation  Psych: pleasant          Palliative Performance Scale: 50 %              Additional Data Reviewed: Recent Labs    08/03/22 0705 08/05/22 0110 08/05/22 0458  WBC 11.8*  --  11.8*  HGB 10.9* 10.2* 9.0*  PLT 497*  --  586*  NA 132* 133* 136  BUN 15  --  33*  CREATININE 0.71  --  1.85*    Imaging: DG CHEST PORT 1 VIEW CLINICAL DATA:  Respiratory compromise  EXAM: PORTABLE CHEST 1 VIEW  COMPARISON:  08/03/2022  FINDINGS: Complete opacification of the right hemithorax, presumably reflecting a large pleural effusion. Left lung is clear. No pneumothorax.  The heart is normal in size.  IMPRESSION: Complete opacification of the right hemithorax, presumably reflecting a large pleural effusion.  Electronically Signed   By: Julian Hy M.D.   On: 08/05/2022 00:41   I personally reviewed recent imaging.   Palliative Care Assessment and Plan Summary of Established Goals of Care and Medical Treatment Preferences   Patient is a 61 year old woman with a past medical history of metastatic breast cancer and hypertension who was admitted on 08/02/2022 for management of decreased appetite, poor p.o. intake, and lethargy.  During hospitalization patient was found to have a current malignant right-sided  pleural effusion which required thoracentesis.  Patient required transfer to the ICU on 11/7 due to becoming unresponsive; patient received Narcan  and fluids for management of hypotension and unresponsiveness.  Palliative care consulted to assist with complex medical decision making and symptom management.  # Complex medical decision making/goals of care  -Patient able to state known metastatic breast cancer.  According to chart review and oncology notes, patient's cancer has been responding to oral therapy.  Unfortunately patient continues to have failure to thrive in setting of this cancer.  Patient's failure to thrive appears to be multifactorial.  -Patient noted used to enjoy being out in the community though essentially now she is at home by herself due to exhaustion.  When discussing care with patient, she seems limited on her ADL and IADL involvement including minimally able to drive, inability to organize her pills, not fixing meals, etc.  Concerned about patient's support at home and that likely needs more family involvement to support care moving forward.   -Consider TOC involvement with family to find supportive strategies moving forward.   -Patient willing to participate in rehab to regain strength if that is recommended by PT/OT.   -Patient stated that if she was unable to make medical decisions for herself the first person who should be called is her mother, and [name].  Second person who should be called as her sister [name].  She noted that her mother and sister would likely support making decisions for her if she was unable to do so.  Recommended completion of ACP documentation while in hospital if able.  -  Code Status: Full Code    -Did not address today with initial meeting of patient.  # Symptom management  -Pain, acute on chronic Patient has been receiving Dilaudid 8 mg p.o. since July 2023 as per PDMP review.  At time of interview, patient denied any pain.  While opioids  can contribute to hypotension, would consider patient may have already been dehydrated in her setting of failure to thrive which allowed for propagation of hypotension in the setting of opioids.   -If needed, consider scheduling Tylenol 1000 mg 3 times daily to help with pain management.   -If needed could also consider other pain interventions such as lidocaine patch or a neuropathic agent.   -Constipation, acute on chronic  Patient reports she believes her last bowel movement was last week.  Discussed with RN who noted patient may have had a bowel movement on 11/6 though documentation does not describe details to know if this occurred.   -May want to consider abdominal x-ray for monitoring of constipation if unable to determine if patient has had good bowel movement while in hospital.  Defer to ICU team at this time.   -Receiving senna 2 tabs twice daily.  Patient to receive up to 8 tabs in a 24-hour period.  # Psycho-social/Spiritual Support:  - Support System: Sister and mother  # Discharge Planning: Likely rehab  Thank you for allowing the palliative care team to participate in the care [name].  Chelsea Aus, DO Palliative Care Provider PMT # [phone]  If patient remains symptomatic despite maximum doses, please call PMT at [phone] between 0700 and 1900. Outside of these hours, please call attending, as PMT does not have night coverage.  This provider spent a total of 81 minutes providing patient's care.  Includes review of EMR, discussing care with other staff members involved in patient's medical care, obtaining relevant history and information from patient and/or patient's family, and personal review of imaging and lab work. Greater than 50% of the time was spent counseling and  coordinating care related to the above assessment and plan.

## 2022-08-05 NOTE — Progress Notes (Addendum)
[facility] Progress Note Patient Name: [name] DOB: Mar 15, 1961 MRN: [id number]   Date of Service  08/05/2022  HPI/Events of Note  Patient transferred to the ICU from the floor secondary to altered mental status, respiratory depression, hypotension, in the context of suspected narcotic side effects, she has metastatic breast cancer and is on scheduled narcotics.  eICU Interventions  New Patient Evaluation. Continue BIPAP.        Kerry Kass Kaleo Condrey 08/05/2022, 3:28 AM

## 2022-08-05 NOTE — Progress Notes (Signed)
RT NOTE:  Pt taken off BiPAP and placed on 4L [place] with no complications noted. Pt states her breathing feels much better now. RT will continue to monitor pt status.

## 2022-08-05 NOTE — H&P (Signed)
NAME:  [name], MRN:  [id number], DOB:  06-Sep-1961, LOS: 1 ADMISSION DATE:  08/02/2022, CONSULTATION DATE:  08/05/22 REFERRING MD:  Gershon Cull , CHIEF COMPLAINT:   Lethargy, ams   History of Present Illness:  [name] is a 61 yo woman with a hx of metastatic breast cancer, HTN, admitted 11/4 with decreased appetite, poor po intake , weakness, lethargy; transferred to ICU tonight due to hypotension and lethargy.  On oral therapy for cancer:  +SOB, weakness  11/5 HR 130-140  Afib.  Given 2.5 mg iV metop when back to sinus. 500cc LR bolus.   11/5 IR thora 800cc yellow fluid removed  Stopped due to pain Afib again 11/6 Developed afib with RVR 11/6 given bolus and lopressor again 845.    Unresponsive tonight, responded to narcan.    Hospital meds: lovenox ppx, pepcid , megace, metop at 1318 pm 2009 pm today 0035 this am  Narcan 0.4 mg at 2219, 0026 Dilaudid '8mg'$  po 1053, 1701  Micro pleural effusion:  ABUNDANT GRAM POSITIVE COCCI IN CLUSTERS  RARE WBC PRESENT, PREDOMINANTLY PMN  IN BOTH AEROBIC AND ANAEROBIC BOTTLES   7/242/64.7/164, UA neg on admission  CXR : white out of R lung Pertinent  Medical History  HTN Breast cancer - R chest wall, contralateral breast (L. Mets to lung and bones.  Responding well to chemo/antiestrogen.   Failutre to thrive, weight loss, malignant cachexia  Malignant pleural effusion R   Home meds: abemaciclib, arimidex, calcium carbonate, vit D, Dilaudid '8mg'$  q6 prn  Mag oxide, mirtazapine 7.'5mg'$  qhs  Miralax, senna  Flagyl gel      Significant Hospital Events: Including procedures, antibiotic start and stop dates in addition to other pertinent events     Interim History / Subjective:    Objective   Blood pressure (!) 75/47, pulse 85, temperature 98.2 F (36.8 C), temperature source Axillary, resp. rate (!) 22, height '5\' 6"'$  (1.676 m), weight 53.8 kg, SpO2 100 %.        Intake/Output Summary (Last 24 hours) at 08/05/2022 0328 Last  data filed at 08/05/2022 0251 Gross per 24 hour  Intake 960.17 ml  Output --  Net 960.17 ml   Filed Weights   08/03/22 0438 08/04/22 0500 08/04/22 2315  Weight: 52 kg 52 kg 53.8 kg    Examination: General: on bipap, lethargic , opens eyes to stimulation HENT: ncat, mm dry  Lungs: CTAB Cardiovascular: RRR no mgr Abdomen: nt, nd, nbs  Extremities: No edema  Neuro: lethargic but arousable  GU: purewick , no uop  Resolved Hospital Problem list     Assessment & Plan:  Hypotension: Improved slightly with fluids, improved somewhat with narcan, though still requiring levophed even after narcan bolus given.  Suspect multifactorial, including possible sepsis and dilaudid use.  received 2L NS.  Started on levophed.  Central line placed in L fem (unable to lay flat, somewhat uncooperative as well). Art line placed.   Started cefepime and vanc, gram positive cocci from pleural fluid drawn 11/5.    Tachypnea, Hypercarbia- concurrent with hypoventilation, which improves with narcan.   Also has worsening pleural effusion, appears similar to initial cxr from admission.   Currently appears improved with Bipap, reduces work of breathing.  Oxygen sat remains in mid 90s even while temporarily on room air.  Echo pending.  Hold off on further fluids, patient is wheezing. Stop maintentance fluids.  Place foley.  May need lasix if bp can tolerate.    Lethargy/acute encephalopathy -  improves with narcan, though her subacute chest pain is not controlled while narcan is in effect and she appears very uncomfortable.  Plan to continue to monitor, not start a narcan gtt, can give low doses periodically if needed, but currently is arousable and protecting airway even without narcan.  Hold any further dilaudid.   CMP, CBC, procalcitonin, INR pending.   Metastatic breast cancer, malignant effusion at least since 9/23.  Will likely need a repeat thoracentesis once hemodynamically stabilized.    Cachexia  complication of malignancy most likely.  Overall, appears to be worsening clinically.  Her effusion has recurred back to baseline from admission.   I think a palliative care consult would be warranted.   Best Practice (right click and "Reselect all SmartList Selections" daily)   Diet/type: NPO DVT prophylaxis: other GI prophylaxis: PPI Lines: Central line Foley:  Yes, and it is still needed Code Status:  full code Last date of multidisciplinary goals of care discussion '[]'$   Labs   CBC: Recent Labs  Lab 08/02/22 1900 08/03/22 0705 08/05/22 0110  WBC 11.2* 11.8*  --   HGB 9.5* 10.9* 10.2*  HCT 29.3* 35.5* 30.0*  MCV 81.4 84.5  --   PLT 433* 497*  --     Basic Metabolic Panel: Recent Labs  Lab 08/02/22 1900 08/03/22 0705 08/05/22 0110  NA 136 132* 133*  K 3.5 3.7 3.8  CL 102 100  --   CO2 27 27  --   GLUCOSE 107* 130*  --   BUN 19 15  --   CREATININE 0.70 0.71  --   CALCIUM 7.0* 7.1*  --   MG  --  2.2  --    GFR: Estimated Creatinine Clearance: 61.9 mL/min (by C-G formula based on SCr of 0.71 mg/dL). Recent Labs  Lab 08/02/22 1900 08/03/22 0705  WBC 11.2* 11.8*    Liver Function Tests: Recent Labs  Lab 08/02/22 1900 08/03/22 0705  AST 44* 27  ALT 28 29  ALKPHOS 52 53  BILITOT 0.7 0.4  PROT 6.3* 6.7  ALBUMIN 2.4* 2.6*   No results for input(s): "LIPASE", "AMYLASE" in the last 168 hours. No results for input(s): "AMMONIA" in the last 168 hours.  ABG    Component Value Date/Time   PHART 7.242 (L) 08/05/2022 0110   PCO2ART 64.7 (H) 08/05/2022 0110   PO2ART 164 (H) 08/05/2022 0110   HCO3 28.0 08/05/2022 0110   TCO2 30 08/05/2022 0110   O2SAT 99 08/05/2022 0110     Coagulation Profile: No results for input(s): "INR", "PROTIME" in the last 168 hours.  Cardiac Enzymes: No results for input(s): "CKTOTAL", "CKMB", "CKMBINDEX", "TROPONINI" in the last 168 hours.  HbA1C: No results found for: "HGBA1C"  CBG: Recent Labs  Lab 08/04/22 2322   GLUCAP 124*    Review of Systems:   Unable to assess fully due to patient cooperation.   Past Medical History:  She,  has a past medical history of Hypertension.   Surgical History:   Past Surgical History:  Procedure Laterality Date   ANKLE SURGERY     IR THORACENTESIS ASP PLEURAL SPACE W/IMG GUIDE  04/10/2022   IR US GUIDE BX ASP/DRAIN  03/24/2022     Social History:   reports that she has never smoked. She has never used smokeless tobacco. She reports that she does not drink alcohol and does not use drugs.   Family History:  Her family history includes Cancer in her cousin; Diabetes in her paternal grandmother;  Heart disease in her father; Hypertension in her maternal grandmother, mother, and sister; Kidney disease in her father; Stroke in her father and maternal grandfather.   Allergies No Known Allergies   Home Medications  Prior to Admission medications   Medication Sig Start Date End Date Taking? Authorizing Provider  abemaciclib (VERZENIO) 100 MG tablet Take 1 tablet (100 mg total) by mouth 2 (two) times daily. 04/08/22  Yes Gorsuch, Ni, MD  anastrozole (ARIMIDEX) 1 MG tablet Take 1 tablet (1 mg total) by mouth daily. 04/08/22  Yes Gorsuch, Ni, MD  calcium carbonate (TUMS - DOSED IN MG ELEMENTAL CALCIUM) 500 MG chewable tablet Chew 1 tablet by mouth 2 (two) times daily.   Yes [provider]  cholecalciferol (VITAMIN D3) 25 MCG (1000 UNIT) tablet Take 2,000 Units by mouth daily.   Yes [provider]  famotidine (PEPCID) 20 MG tablet Take 1 tablet (20 mg total) by mouth 2 (two) times daily. 05/30/22  Yes Gorsuch, Ni, MD  HYDROmorphone (DILAUDID) 8 MG tablet Take 1 tablet (8 mg total) by mouth every 6 (six) hours as needed for moderate pain. 07/31/22  Yes Gorsuch, Ni, MD  magnesium oxide (MAG-OX) 400 (240 Mg) MG tablet Take 1 tablet (400 mg total) by mouth daily. 06/12/22  Yes Gorsuch, Ni, MD  megestrol (MEGACE) 40 MG tablet Take 40 mg by mouth daily.   Yes  [provider]  mirtazapine (REMERON) 7.5 MG tablet Take 1 tablet (7.5 mg total) by mouth at bedtime. 05/30/22  Yes Gorsuch, Ni, MD  polyethylene glycol (MIRALAX / GLYCOLAX) 17 g packet Take 17 g by mouth daily.   Yes [provider]  senna (SENOKOT) 8.6 MG tablet Take 2 tablets by mouth 2 (two) times daily.   Yes [provider]  metroNIDAZOLE (METROGEL) 1 % gel Apply topically daily. Patient not taking: Reported on 08/02/2022 03/29/22   Larene Pickett, PA-C     Critical care time: 60 min

## 2022-08-05 NOTE — Progress Notes (Signed)
[name]   DOB:July 23, 1961   TM#:[id number]    ASSESSMENT & PLAN:  Metastatic breast cancer to the lung and bones While she responded very well to chemotherapy and antiestrogen therapy, she is not thriving due to progressive weight loss and weakness While hospitalized, I recommend we continue on antiestrogen therapy but hold abemaciclib I have discussed the plan of care with family I do believe the patient could benefit from skilled nursing facility placement after discharge   Altered mental status, resolved Multifactorial, could be due to narcotic prescription She denies pain right now  Recurrent malignant pleural effusion She has persistent pleural effusion despite thoracentesis Monitor closely Her oxygen saturation is adequate I reviewed the risk and benefits of Pleurx catheter with the family The final plan is to have 1 more therapeutic thoracentesis and reassess again in a few days   Malignant cachexia Failure to thrive Severe protein calorie malnutrition This is multifactorial, a component of noncompliance and poor access to food Recommend dietitian review and additional nutritional supplement as tolerated I will start her on 48-hour calorie count   Generalized weakness Failure to thrive Multifactorial, related to progressive weight loss, muscle loss and deconditioning Recommend skilled nursing facility placement upon discharge   Code Status Full code   Goals of care Improvement of weakness   Discharge planning She can benefit from skilled nursing facility placement I do not believe she is safe to be discharged due to high risk of falls, severe cachexia, etc. I spent a lot of time reviewing plan of care with her mother and sister Continue supportive care [place] I am working remotely from home tomorrow but I will return to check on her on Thursday  All questions were answered. The patient knows to call the clinic with any problems, questions or concerns.   The  total time spent in the appointment was 80 minutes encounter with patients including review of chart and various tests results, discussions about plan of care and coordination of care plan  Heath Lark, MD 08/05/2022 9:18 AM  Subjective:  Overnight events were reviewed.  The patient is currently in the ICU.  She appears alert and oriented.  She denies pain. I spent over 20 minutes on the phone reviewing plan of care with family members  Objective:  Vitals:   08/05/22 0736 08/05/22 0802  BP:    Pulse: 86   Resp: (!) 25   Temp:  97.6 F (36.4 C)  SpO2: 100%      Intake/Output Summary (Last 24 hours) at 08/05/2022 0918 Last data filed at 08/05/2022 0700 Gross per 24 hour  Intake 1872.8 ml  Output --  Net 1872.8 ml    GENERAL:alert, no distress and comfortable NEURO: alert & oriented x 3 with fluent speech, no focal motor/sensory deficits   Labs:  Recent Labs    08/02/22 1900 08/03/22 0705 08/05/22 0110 08/05/22 0458  NA 136 132* 133* 136  K 3.5 3.7 3.8 4.0  CL 102 100  --  103  CO2 27 27  --  25  GLUCOSE 107* 130*  --  129*  BUN 19 15  --  33*  CREATININE 0.70 0.71  --  1.85*  CALCIUM 7.0* 7.1*  --  7.1*  GFRNONAA >60 >60  --  30*  PROT 6.3* 6.7  --  6.3*  ALBUMIN 2.4* 2.6*  --  2.4*  AST 44* 27  --  22  ALT 28 29  --  23  ALKPHOS 52 53  --  50  BILITOT 0.7 0.4  --  0.3    Studies:  DG CHEST PORT 1 VIEW  Result Date: 08/05/2022 CLINICAL DATA:  Respiratory compromise EXAM: PORTABLE CHEST 1 VIEW COMPARISON:  08/03/2022 FINDINGS: Complete opacification of the right hemithorax, presumably reflecting a large pleural effusion. Left lung is clear. No pneumothorax. The heart is normal in size. IMPRESSION: Complete opacification of the right hemithorax, presumably reflecting a large pleural effusion. Electronically Signed   By: Julian Hy M.D.   On: 08/05/2022 00:41   US THORACENTESIS ASP PLEURAL SPACE W/IMG GUIDE  Result Date: 08/03/2022 INDICATION: Patient with  history of right-sided breast cancer, right pleural effusion. Request is made for diagnostic and therapeutic thoracentesis. EXAM: ULTRASOUND GUIDED RIGHT THORACENTESIS MEDICATIONS: 10 mL 1% lidocaine COMPLICATIONS: None immediate. PROCEDURE: An ultrasound guided thoracentesis was thoroughly discussed with the patient and questions answered. The benefits, risks, alternatives and complications were also discussed. The patient understands and wishes to proceed with the procedure. Written consent was obtained. Ultrasound was performed to localize and mark an adequate pocket of fluid in the right chest. The area was then prepped and draped in the normal sterile fashion. 1% Lidocaine was used for local anesthesia. Under ultrasound guidance a 6 Fr Safe-T-Centesis catheter was introduced. Thoracentesis was performed. The catheter was removed and a dressing applied. FINDINGS: A total of approximately 800 mL of yellow fluid was removed. Samples were sent to the laboratory as requested by the clinical team. IMPRESSION: Successful ultrasound guided right thoracentesis yielding 800 mL of pleural fluid. Read by: Brynda Greathouse PA-C Electronically Signed   By: Sandi Mariscal M.D.   On: 08/03/2022 13:06   DG Chest Port 1 View  Result Date: 08/03/2022 CLINICAL DATA:  Post right thoracentesis EXAM: PORTABLE CHEST 1 VIEW COMPARISON:  08/02/2022 FINDINGS: Continued complete opacification of the right hemithorax. No visible pneumothorax. No confluent opacity or effusion on the left. Heart is normal size. IMPRESSION: Continued complete opacification of the right hemithorax. No visible pneumothorax. Electronically Signed   By: Rolm Baptise M.D.   On: 08/03/2022 11:52   DG Chest Portable 1 View  Result Date: 08/02/2022 CLINICAL DATA:  Weakness and breast cancer.  Dehydration.  Fatigue. EXAM: PORTABLE CHEST 1 VIEW COMPARISON:  Chest CT 06/25/2022 FINDINGS: Increased right pleural effusion with complete opacification of the right  hemithorax. There is leftward shift of the trachea and mediastinal structures. No aerated right lung is seen. No focal airspace disease in the left lung. Left lung nodules on prior CT are not seen by radiograph. No visible pneumothorax. Osseous metastatic disease on CT not well demonstrated. IMPRESSION: Increased right pleural effusion with complete opacification of the right hemithorax. No aerated right lung is seen. Leftward shift of the trachea and mediastinal structures. Electronically Signed   By: Keith Rake M.D.   On: 08/02/2022 16:53

## 2022-08-05 NOTE — Procedures (Signed)
Arterial Catheter Insertion Procedure Note  [name]  [id number]  21-Aug-1961  Date:08/05/22  Time:6:31 AM    Provider Performing: Collier Bullock    Procedure: Insertion of Arterial Line [phone]) with US guidance ([id number])   Indication(s) Blood pressure monitoring and/or need for frequent ABGs  Consent Risks of the procedure as well as the alternatives and risks of each were explained to the patient and/or caregiver.  Consent for the procedure was obtained and is signed in the bedside chart  Anesthesia lidocaine   Time Out Verified patient identification, verified procedure, site/side was marked, verified correct patient position, special equipment/implants available, medications/allergies/relevant history reviewed, required imaging and test results available.   Sterile Technique Maximal sterile technique including full sterile barrier drape, hand hygiene, sterile gown, sterile gloves, mask, hair covering, sterile ultrasound probe cover (if used).   Procedure Description Area of catheter insertion was cleaned with chlorhexidine and draped in sterile fashion. With real-time ultrasound guidance an arterial catheter was placed into the left femoral artery.  Appropriate arterial tracings confirmed on monitor.     Complications/Tolerance None; patient tolerated the procedure well.   EBL Minimal   Specimen(s) None

## 2022-08-05 NOTE — Progress Notes (Signed)
  Echocardiogram 2D Echocardiogram has been performed.  Bobbye Charleston 08/05/2022, 10:07 AM

## 2022-08-05 NOTE — H&P (Addendum)
NAME:  [name], MRN:  [id number], DOB:  06/03/1961, LOS: 1 ADMISSION DATE:  08/02/2022, CONSULTATION DATE:  08/05/22 REFERRING MD:  Gershon Cull , CHIEF COMPLAINT:   Lethargy, ams   BRIEF  Ms Spurgin is a 61 yo woman with a hx of metastatic breast cancer, HTN, admitted 11/4 with decreased appetite, poor po intake , weakness, lethargy; transferred to ICU tonight due to hypotension and lethargy.  On oral therapy for cancer:  +SOB, weakness  11/5 HR 130-140  Afib.  Given 2.5 mg iV metop when back to sinus. 500cc LR bolus.   11/5 IR thora 800cc yellow fluid removed  Stopped due to pain Afib again 11/6 Developed afib with RVR 11/6 given bolus and lopressor again 845.    Unresponsive tonight, responded to narcan.    Hospital meds: lovenox ppx, pepcid , megace, metop at 1318 pm 2009 pm today 0035 this am  Narcan 0.4 mg at 2219, 0026 Dilaudid '8mg'$  po 1053, 1701  Micro pleural effusion:  ABUNDANT GRAM POSITIVE COCCI IN CLUSTERS  RARE WBC PRESENT, PREDOMINANTLY PMN  IN BOTH AEROBIC AND ANAEROBIC BOTTLES   7/242/64.7/164, UA neg on admission  CXR : white out of R lung Pertinent  Medical History  HTN Breast cancer - R chest wall, contralateral breast (L. Mets to lung and bones.  Responding well to chemo/antiestrogen.   Failutre to thrive, weight loss, malignant cachexia  Malignant pleural effusion R   Home meds: abemaciclib, arimidex, calcium carbonate, vit D, Dilaudid '8mg'$  q6 prn  Mag oxide, mirtazapine 7.'5mg'$  qhs  Miralax, senna   Flagyl gel      Significant Hospital Events: Including procedures, antibiotic start and stop dates in addition to other pertinent events   03/23/2022: Right-sided thoracentesis: LDH 259.  White cells 207.  8% polymorphs. Malignant cells consistent with breast cancer present 08/02/2022 - admit 08/03/2022: Right thoracentesis 800 mL and stopped due to pain LDH 439, white cell 7100/84% polymorphs > huge increase in white count and left  shift. 08/05/22 - ccm consult and ICU tx  Interim History / Subjective:   08/05/22 -last Narcan was at 4 AM.  Now completely awake on BiPAP.  Wants BiPAP off.  No respiratory distress.  Remains on vasopressors Levophed 9 mcg and vasopressin.  Diastolic is low at 40 with map of 71.  She has left femoral arterial line and central line.  She is oriented and communicating.  She remains afebrile.  White count is high.  She is on antibiotics vancomycin and cefepime.   Objective   Blood pressure (!) 97/44, pulse 76, temperature 98.2 F (36.8 C), temperature source Axillary, resp. rate 14, height '5\' 6"'$  (1.676 m), weight 53.8 kg, SpO2 96 %.    FiO2 (%):  [30 %] 30 %   Intake/Output Summary (Last 24 hours) at 08/05/2022 0720 Last data filed at 08/05/2022 0700 Gross per 24 hour  Intake 1872.8 ml  Output --  Net 1872.8 ml   Filed Weights   08/04/22 0500 08/04/22 2315 08/05/22 0500  Weight: 52 kg 53.8 kg 53.8 kg    General Appearance: Extremely cachectic female on BiPAP.  Trying to sit at bed and communicate Head:  Normocephalic, without obvious abnormality, atraumatic Eyes:  PERRL - yes, conjunctiva/corneas - miuddy     Ears:  Normal external ear canals, both ears Nose:  G tube - no Throat:  ETT TUBE - no , OG tube - no. BIPAP + Neck:  Supple,  No enlargement/tenderness/nodules Lungs: Clear to auscultation bilaterally,  V Heart:  S1 and S2 normal, no murmur, CVP - no.  Pressors -Levophed and vasopressin through left femoral line Abdomen:  Soft, no masses, no organomegaly Genitalia / Rectal:  Not done.  Has a pure wick.  Has a left femoral arterial and central line Extremities:  Extremities-intact Skin:  ntact in exposed areas . Sacral area -not examined Neurologic:  Sedation -none-> RASS -last 1. Moves all 4s -yes. CAM-ICU -negative. Orientation -x3     Resolved Hospital Problem list       PULMONARY  A:  Recurrent malignant right-sided pleural effusion [large] - Prior to & Present  on Admit  -First diagnosis March 23, 2022  -Most recent thoracentesis 08/03/2022  Acute respiratory acidosis hypercapnic respiratory failure 08/04/2022 resulting in ICU transfer and BiPAP  08/05/2022 -> pleural effusion continued to contribute towards respiratory burden .  Responded to Narcan and more awake  P:   Come off BiPAP and monitor Re pleurx. -> Dw/ Dr Alvy Bimler, "Neidy has very limited ability to take care of herself. overall she is declining. I see some patients losing a lot of protein through regular drainage. I recommend 1 more thora and I will update the family of the option for pleurx "  Oxygen for pulse ox greater than 92% [has dark skin]   NEUROLOGIC A:   Chronic cancer related pain -on opioids at home Chronic depression on Remeron at home Acute obtunded encephalopathy 08/04/2022 requiring BiPAP and Narcan pushes  08/05/22: Resolved with BiPAP and Narcan push.  P:   Monitor Opioids for pain prn RASS goal 0 to -2   VASCULAR A:   Circulatory shock onset 08/04/2022 -likely due to pain meds +/- sepsis  29/5/62: Has low diastolic.  On Levophed and vasopressin  P:  Fluid bolus Blood pressure goal to systolic greater than 130 MAP greater than 60 Continue Levophed and vasopressin Add midodrine Check random cortisol -> while awaiting results start hydrocortisone   CARDIAC ELECTRICAL A: Transient atrial fibrillation at admission 08/03/2022 -troponin normal  08/05/2022: EKG again with atrial fibrillation.  Troponin normal  P: Repeat EKG Get echocardiogram ? Needs Amio   INFECTIOUS A:   Concern for sepsis 08/05/22 -at time of ICU transfer   P:   Await cultures Continue broad antibiotics Track PCT  RENAL A:  New onset acute kidney injury creatinine 1.85 mg percent 08/05/2022 [baseline creatinine 0.7 mg percent  P:  Maintain hemodynamics Avoid nephrotoxins PureWick  Fluids and monitor   ELECTROLYTES A:  Hypocalcemia -likely baseline issue.  On  elemental calcium at home  08/05/2022: Low calcium  P: Calcium gluconate   GASTROINTESTINAL A:   Severe cachexia and PCM - baseline alb 2.4 at admit Low appeitie - on Megace  P:   Cotninue megace   HEMATOLOGIC   - HEME A:  Chronic anemia - last transfusion likely June 2023  11/7 -  hgb 9gm% . No actoive bleeding   P:  - PRBC for hgb </= 6.9gm%    - exceptions are   -  if ACS susepcted/confirmed then transfuse for hgb </= 8.0gm%,  or    -  active bleeding with hemodynamic instability, then transfuse regardless of hemoglobin value   At at all times try to transfuse 1 unit prbc as possible with exception of active hemorrhage  HEMATOLOGIC - Platelets A Thrombocytosis - Baseline 400-600K - Prior to & Present on Admit  11/7 - similar to baseline  P Lovenox for dvt proph Monitfor for drop  ENDOCRINE A:  AT risk for hypo and hyperglycemia    P:   SSI  Oncology  Advanced breast cancer - On Verzenio and Arimidex with malignant pleural effusion- Prior to & Present on Admit  Plan  - per onc  MSK/DERM Profound physical deconditioning Performed debility Profound cachexia Profound failure to thrivae - on megace Vit D def - on replacement Severe protein calorie malnutrition  All above prior to admission  Plan  - PT/OT - Nutrition -Megace - Might need SNF   Best Practice (right click and "Reselect all SmartList Selections" daily)  Diet/type: NPO - clearl liquid diet DVT prophylaxis: other GI prophylaxis: PEPCID oral Lines: Central line and al ine - both left femoral 08/05/22 Foley:  PUREWICK Code Status:  full code Last date of multidisciplinary goals of care discussion '[]'$  - pall care called - goal of care needed. Consider pleurx -Called sister Kerline Trahan [phone] 8:41am and updated - wants Dr Alvy Bimler t recommend and explain to family before family commits to [place]   The patient Shaely Gadberry is critically ill  with multiple organ systems failure and requires high complexity decision making for assessment and support, frequent evaluation and titration of therapies, application of advanced monitoring technologies and extensive interpretation of multiple databases.   Critical Care Time devoted to patient care services described in this note is  60  Minutes. This time reflects time of care of this signee Dr Brand Males. This critical care time does not reflect procedure time, or teaching time or supervisory time of PA/NP/Med student/Med Resident etc but could involve care discussion time     Dr. Brand Males, M.D., [facility].C.P Pulmonary and Critical Care Medicine Medical Director - [facility] ICU Staff Physician, [place] Pulmonary and Critical Care Pager: [phone], If no answer or between  15:00h - 7:00h: call [phone]  08/05/2022 7:20 AM    LABS    PULMONARY Recent Labs  Lab 08/05/22 0110  PHART 7.242*  PCO2ART 64.7*  PO2ART 164*  HCO3 28.0  TCO2 30  O2SAT 99    CBC Recent Labs  Lab 08/02/22 1900 08/03/22 0705 08/05/22 0110 08/05/22 0458  HGB 9.5* 10.9* 10.2* 9.0*  HCT 29.3* 35.5* 30.0* 29.8*  WBC 11.2* 11.8*  --  11.8*  PLT 433* 497*  --  586*    COAGULATION Recent Labs  Lab 08/05/22 0458  INR 1.4*    CARDIAC  No results for input(s): "TROPONINI" in the last 168 hours. No results for input(s): "PROBNP" in the last 168 hours.   CHEMISTRY Recent Labs  Lab 08/02/22 1900 08/03/22 0705 08/05/22 0110 08/05/22 0458  NA 136 132* 133* 136  K 3.5 3.7 3.8 4.0  CL 102 100  --  103  CO2 27 27  --  25  GLUCOSE 107* 130*  --  129*  BUN 19 15  --  33*  CREATININE 0.70 0.71  --  1.85*  CALCIUM 7.0* 7.1*  --  7.1*  MG  --  2.2  --  2.4   Estimated Creatinine Clearance: 26.8 mL/min (A) (by C-G formula based on SCr of 1.85 mg/dL (H)).   LIVER Recent Labs  Lab 08/02/22 1900 08/03/22 0705 08/05/22 0458  AST 44* 27 22   ALT '28 29 23  '$ ALKPHOS 52 53 50  BILITOT 0.7 0.4 0.3  PROT 6.3* 6.7 6.3*  ALBUMIN 2.4* 2.6* 2.4*  INR  --   --  1.4*     INFECTIOUS Recent Labs  Lab 08/05/22 0458  PROCALCITON 0.97     ENDOCRINE CBG (last 3)  Recent Labs    08/04/22 2322  GLUCAP 124*         IMAGING x48h  - image(s) personally visualized  -   highlighted in bold DG CHEST PORT 1 VIEW  Result Date: 08/05/2022 CLINICAL DATA:  Respiratory compromise EXAM: PORTABLE CHEST 1 VIEW COMPARISON:  08/03/2022 FINDINGS: Complete opacification of the right hemithorax, presumably reflecting a large pleural effusion. Left lung is clear. No pneumothorax. The heart is normal in size. IMPRESSION: Complete opacification of the right hemithorax, presumably reflecting a large pleural effusion. Electronically Signed   By: Julian Hy M.D.   On: 08/05/2022 00:41   US THORACENTESIS ASP PLEURAL SPACE W/IMG GUIDE  Result Date: 08/03/2022 INDICATION: Patient with history of right-sided breast cancer, right pleural effusion. Request is made for diagnostic and therapeutic thoracentesis. EXAM: ULTRASOUND GUIDED RIGHT THORACENTESIS MEDICATIONS: 10 mL 1% lidocaine COMPLICATIONS: None immediate. PROCEDURE: An ultrasound guided thoracentesis was thoroughly discussed with the patient and questions answered. The benefits, risks, alternatives and complications were also discussed. The patient understands and wishes to proceed with the procedure. Written consent was obtained. Ultrasound was performed to localize and mark an adequate pocket of fluid in the right chest. The area was then prepped and draped in the normal sterile fashion. 1% Lidocaine was used for local anesthesia. Under ultrasound guidance a 6 Fr Safe-T-Centesis catheter was introduced. Thoracentesis was performed. The catheter was removed and a dressing applied. FINDINGS: A total of approximately 800 mL of yellow fluid was removed. Samples were sent to the laboratory as requested  by the clinical team. IMPRESSION: Successful ultrasound guided right thoracentesis yielding 800 mL of pleural fluid. Read by: Brynda Greathouse PA-C Electronically Signed   By: Sandi Mariscal M.D.   On: 08/03/2022 13:06   DG Chest Port 1 View  Result Date: 08/03/2022 CLINICAL DATA:  Post right thoracentesis EXAM: PORTABLE CHEST 1 VIEW COMPARISON:  08/02/2022 FINDINGS: Continued complete opacification of the right hemithorax. No visible pneumothorax. No confluent opacity or effusion on the left. Heart is normal size. IMPRESSION: Continued complete opacification of the right hemithorax. No visible pneumothorax. Electronically Signed   By: Rolm Baptise M.D.   On: 08/03/2022 11:52

## 2022-08-05 NOTE — Progress Notes (Signed)
PT Cancellation Note  Patient Details Name: [name] MRN: [id number] DOB: 19-Jun-1961   Cancelled Treatment:    Reason Eval/Treat Not Completed: Medical issues which prohibited therapy, per  RN, ha femoral art line.  Will proceed when it is out.  [place] Office [phone] Weekend [phone]    Claretha Cooper 08/05/2022, 9:23 AM

## 2022-08-05 NOTE — Progress Notes (Signed)
[facility] Progress Note Patient Name: [name] DOB: 1961/06/29 MRN: [id number]   Date of Service  08/05/2022  HPI/Events of Note  Patient with intermittent agitation, associate increases in heart rate and blood pressure but saturation remains 96 - 100 % when pulse ox is tracking.  eICU Interventions  Zyprexa 2.5 mg  IM x 1, stat portable CXR and ABG to make sure there are no underlying respiratory concerns, discontinue arterial line.        Kerry Kass [name] 08/05/2022, 10:52 PM

## 2022-08-05 NOTE — Progress Notes (Signed)
ABG reviewed.  This Probation officer explained the need / benefits of wearing bipap again tonight, but the patient adamantly refused.  Pt stated that she "respects my professional opinion" but she's not going to wear the bipap.  RN aware, at bedside.  Elink MD notified of patient's refusal.

## 2022-08-05 NOTE — Progress Notes (Signed)
Initial Nutrition Assessment  DOCUMENTATION CODES:  Severe malnutrition in context of chronic illness  INTERVENTION:  -Advance diet as clinically appropriate -Provide Boost Breeze TID while on CLD (250kcal, 9g protein) -When diet is advanced, provide EPHP TID (350kcal, 20g protein) -Continue bowel regimen -Complete 48hr calcorie count  NUTRITION DIAGNOSIS:  Severe Malnutrition related to chronic illness as evidenced by percent weight loss, severe fat depletion, severe muscle depletion.  GOAL:  Patient will meet greater than or equal to 90% of their needs  MONITOR:  PO intake, Supplement acceptance, Diet advancement  REASON FOR ASSESSMENT:  Malnutrition Screening Tool   ASSESSMENT:  Pt is a 61yo F with PMH of metastatic breast cancer, and HTN who presents with decreased appetite, poor po intake, weakness, and lethargy. Required transfer to ICU for hypotension and lethargy.  Visited pt at bedside this AM. She reports poor po intake PTA and continued weight loss. Seemed a little confused this morning when asking about po history. Pt receives Ensure Plus HP (350kcal and 20g protein) from the cancer center. Weight history in EMR shows a significant 17.4% weight loss in the last 4 months. NFPE shows severe muscle wasting and severe fat loss. Pt meets ASPEN criteria for severe protein calorie malnutrition r/t chronic illness.   Currently on clear liquid diet due to high pressor requirement and large stool burden seen on imaging. Started on bowel regimen. Provide Boost Breeze TID while on CLD to provide 250kcal and 9g protein/bottle. Spoke with RN about meal trays, plan to order standard trays while on clear liquids. When diet is advanced will order for meal assist. Pt unlikely to meet estimated needs on restrictive diet, despite clear liquid supplementation. Calorie count started by oncologist. She would like to begin today, made RN aware.   Medications reviewed and include: calcium  carbonate, pepcid, solu-cortef, megace, midodrine, remeron, miralax, senna, calcium gluconate, levophed, vasopressin  Home nutrition related medications: calcium carbonate, cholecalciferol, pepcid, mag-ox, megace, remeron, miralax, senna  Labs reviewed: BG:124-129, BUN:33, Cr:1.85, ionized Ca:1.12, GFR:30, vitamin D:26.71(September)  Weight History:  08/05/22 53.8 kg  07/25/22 54.8 kg  06/27/22 56 kg  06/12/22 58.2 kg  05/30/22 58.6 kg  05/08/22 60.9 kg  04/24/22 62.2 kg  04/17/22 62.9 kg  04/08/22 65.1 kg  Significant 17.4% unintended weight loss in 4 months  NUTRITION - FOCUSED PHYSICAL EXAM:  Flowsheet Row Most Recent Value  Orbital Region Severe depletion  Upper Arm Region Severe depletion  Thoracic and Lumbar Region Severe depletion  Buccal Region Moderate depletion  Temple Region Severe depletion  Clavicle Bone Region Severe depletion  Clavicle and Acromion Bone Region Severe depletion  Scapular Bone Region Unable to assess  Dorsal Hand Moderate depletion  Patellar Region Severe depletion  Anterior Thigh Region Moderate depletion  Posterior Calf Region Moderate depletion  Hair Reviewed  Eyes Reviewed  Mouth Reviewed  Skin Reviewed  Nails Reviewed       Diet Order:   Diet Order             Diet clear liquid Room service appropriate? Yes; Fluid consistency: Thin  Diet effective now                   EDUCATION NEEDS:   Not appropriate for education at this time  Skin:  Skin Assessment: Reviewed RN Assessment  Last BM:  11/6  Height:  Ht Readings from Last 1 Encounters:  08/03/22 '5\' 6"'$  (1.676 m)   Weight:  Wt Readings from Last 1 Encounters:  08/05/22 53.8 kg   Ideal Body Weight:  59.1 kg  BMI:  Body mass index is 19.14 kg/m.  Estimated Nutritional Needs:  Kcal:  1675-2015kcal ([place] x 1.5-1.8, REE: 1119) Protein:  80-110g (1.5-2.0g/kg) Fluid:  1615-1835m (30-33mkg)   KaCandise BowensMS, RD, LDN, CNSC See AMiON for contact  information

## 2022-08-05 NOTE — Progress Notes (Signed)
Pharmacy Antibiotic Note  [name] is a 61 y.o. female with breast cancer and recurrent pleural effusion who presented to the ED on 08/02/2022 with c/o weakness, generalized pain, SOB and decreased appetite. CXR on 08/02/22 showed increased right pleural effusion with complete opacification of the right hemithorax.  She underwent thoracentesis on 08/03/22.  She became hypotensive and unresponsive on 08/05/22 and subsequently transferred to the ICU.  Vancomycin and cefepime started this morning (11/7) for suspected PNA.  11/7 Cefepime >>   11/7 Vancomycin >>     11/5 Pleural fluid Cx: Gram + Cocci; abundant staph aureus (suscept pending) 11/6 MRSA PCR: negative  Today, 08/05/2022: - afeb - wbc 11.8 (on steroid) - scr up 1.85 (crcl ~27)   Plan: - adjust vancomycin to 1000 mg IV q48h and cefepime to 2gm IV q24h - monitor renal function closely and adjust dose if/when appr - f/u with suscept for staph aureus   - lovenox for VTE px adjusted to 30 mg daily and pepcid to '20mg'$  daily for crcl <30 ___________________________________  Height: '5\' 6"'$  (167.6 cm) Weight: 53.8 kg (118 lb 9.7 oz) IBW/kg (Calculated) : 59.3  Temp (24hrs), Avg:98.1 F (36.7 C), Min:97.6 F (36.4 C), Max:98.6 F (37 C)  Recent Labs  Lab 08/02/22 1900 08/03/22 0705 08/05/22 0458  WBC 11.2* 11.8* 11.8*  CREATININE 0.70 0.71 1.85*    Estimated Creatinine Clearance: 26.8 mL/min (A) (by C-G formula based on SCr of 1.85 mg/dL (H)).    No Known Allergies   Thank you for allowing pharmacy to be a part of this patient's care.  Lynelle Doctor 08/05/2022 8:34 AM

## 2022-08-05 NOTE — Progress Notes (Signed)
   CCM pm note  -Improved mental status and alertness. - Off pressors  Plan - DC A-line - Removed vasopressors of the [facility] - Transfer back to [facility] hospitalist service starting the morning of 08/06/2022 -acknowledged by Dr. Dareen Piano    Dr. Brand Males, M.D., F.C.C.P,  Pulmonary and Critical Care Medicine Staff Physician, [place] Director - Interstitial Lung Disease  Program  Medical Director - [facility] [place] at [place], [place], [address]   Pager: [phone], If no answer  -[place] or [place] [phone] Telephone (clinical office): [phone] Telephone (research): [phone]  6:36 PM 08/05/2022

## 2022-08-05 NOTE — Procedures (Addendum)
Central Venous Catheter Insertion Procedure Note  [name]  [id number]  06/05/1961  Date:08/05/22  Time:6:26 AM   Provider Performing:[place] Hoback Duwayne Heck   Procedure: Insertion of Non-tunneled Central Venous [id number]) with US guidance (72620)   Indication(s) Medication administration  Consent Risks of the procedure as well as the alternatives and risks of each were explained to the patient and/or caregiver.  Consent for the procedure was obtained and is signed in the bedside chart  Anesthesia Topical only with 1% lidocaine   Timeout Verified patient identification, verified procedure, site/side was marked, verified correct patient position, special equipment/implants available, medications/allergies/relevant history reviewed, required imaging and test results available.  Sterile Technique Maximal sterile technique including full sterile barrier drape, hand hygiene, sterile gown, sterile gloves, mask, hair covering, sterile ultrasound probe cover (if used).  Procedure Description Area of catheter insertion was cleaned with chlorhexidine and draped in sterile fashion.  With real-time ultrasound guidance a central venous catheter was placed into the left femoral vein. Nonpulsatile blood flow and easy flushing noted in all ports.  The catheter was sutured in place and sterile dressing applied. Line placed on the 2nd attempt.  Wire placed initially but wouldn't thread past about 10cm.  Line would not thread over wire.   Line placed easily without problems on second attempt.     Complications/Tolerance None; patient tolerated the procedure well. Chest X-ray is ordered to verify placement for internal jugular or subclavian cannulation.   Chest x-ray is not ordered for femoral cannulation. I  EBL Minimal  Specimen(s) None

## 2022-08-06 ENCOUNTER — Other Ambulatory Visit (HOSPITAL_COMMUNITY): Payer: Self-pay

## 2022-08-06 ENCOUNTER — Inpatient Hospital Stay (HOSPITAL_COMMUNITY): Payer: BC Managed Care – PPO

## 2022-08-06 ENCOUNTER — Telehealth: Payer: Self-pay | Admitting: Pulmonary Disease

## 2022-08-06 ENCOUNTER — Telehealth: Payer: Self-pay

## 2022-08-06 DIAGNOSIS — C50919 Malignant neoplasm of unspecified site of unspecified female breast: Secondary | ICD-10-CM

## 2022-08-06 DIAGNOSIS — R6521 Severe sepsis with septic shock: Secondary | ICD-10-CM | POA: Diagnosis not present

## 2022-08-06 DIAGNOSIS — J869 Pyothorax without fistula: Secondary | ICD-10-CM | POA: Diagnosis not present

## 2022-08-06 DIAGNOSIS — A419 Sepsis, unspecified organism: Secondary | ICD-10-CM | POA: Diagnosis not present

## 2022-08-06 DIAGNOSIS — J910 Malignant pleural effusion: Secondary | ICD-10-CM | POA: Diagnosis not present

## 2022-08-06 DIAGNOSIS — Z515 Encounter for palliative care: Secondary | ICD-10-CM | POA: Diagnosis not present

## 2022-08-06 DIAGNOSIS — C7800 Secondary malignant neoplasm of unspecified lung: Secondary | ICD-10-CM

## 2022-08-06 DIAGNOSIS — J90 Pleural effusion, not elsewhere classified: Secondary | ICD-10-CM | POA: Diagnosis not present

## 2022-08-06 DIAGNOSIS — B9561 Methicillin susceptible Staphylococcus aureus infection as the cause of diseases classified elsewhere: Secondary | ICD-10-CM

## 2022-08-06 DIAGNOSIS — C7951 Secondary malignant neoplasm of bone: Secondary | ICD-10-CM

## 2022-08-06 DIAGNOSIS — E43 Unspecified severe protein-calorie malnutrition: Secondary | ICD-10-CM | POA: Diagnosis not present

## 2022-08-06 DIAGNOSIS — C50911 Malignant neoplasm of unspecified site of right female breast: Secondary | ICD-10-CM | POA: Diagnosis not present

## 2022-08-06 LAB — PROTIME-INR
INR: 1.3 — ABNORMAL HIGH (ref 0.8–1.2)
Prothrombin Time: 16.1 seconds — ABNORMAL HIGH (ref 11.4–15.2)

## 2022-08-06 LAB — BODY FLUID CELL COUNT WITH DIFFERENTIAL
Eos, Fluid: 0 %
Lymphs, Fluid: 6 %
Monocyte-Macrophage-Serous Fluid: 18 % — ABNORMAL LOW (ref 50–90)
Neutrophil Count, Fluid: 76 % — ABNORMAL HIGH (ref 0–25)
Total Nucleated Cell Count, Fluid: 8030 cu mm — ABNORMAL HIGH (ref 0–1000)

## 2022-08-06 LAB — CULTURE, BODY FLUID W GRAM STAIN -BOTTLE

## 2022-08-06 LAB — LACTATE DEHYDROGENASE, PLEURAL OR PERITONEAL FLUID: LD, Fluid: 452 U/L — ABNORMAL HIGH (ref 3–23)

## 2022-08-06 LAB — COMPREHENSIVE METABOLIC PANEL
ALT: 21 U/L (ref 0–44)
AST: 18 U/L (ref 15–41)
Albumin: 2.3 g/dL — ABNORMAL LOW (ref 3.5–5.0)
Alkaline Phosphatase: 49 U/L (ref 38–126)
Anion gap: 6 (ref 5–15)
BUN: 35 mg/dL — ABNORMAL HIGH (ref 8–23)
CO2: 28 mmol/L (ref 22–32)
Calcium: 8 mg/dL — ABNORMAL LOW (ref 8.9–10.3)
Chloride: 102 mmol/L (ref 98–111)
Creatinine, Ser: 1.38 mg/dL — ABNORMAL HIGH (ref 0.44–1.00)
GFR, Estimated: 43 mL/min — ABNORMAL LOW (ref >60)
Glucose, Bld: 124 mg/dL — ABNORMAL HIGH (ref 70–99)
Potassium: 4.2 mmol/L (ref 3.5–5.1)
Sodium: 136 mmol/L (ref 135–145)
Total Bilirubin: 0.4 mg/dL (ref 0.3–1.2)
Total Protein: 6 g/dL — ABNORMAL LOW (ref 6.5–8.1)

## 2022-08-06 LAB — CBC
HCT: 26.1 % — ABNORMAL LOW (ref 36.0–46.0)
Hemoglobin: 8 g/dL — ABNORMAL LOW (ref 12.0–15.0)
MCH: 26.1 pg (ref 26.0–34.0)
MCHC: 30.7 g/dL (ref 30.0–36.0)
MCV: 85 fL (ref 80.0–100.0)
Platelets: 565 10*3/uL — ABNORMAL HIGH (ref 150–400)
RBC: 3.07 MIL/uL — ABNORMAL LOW (ref 3.87–5.11)
RDW: 17.1 % — ABNORMAL HIGH (ref 11.5–15.5)
WBC: 13.7 10*3/uL — ABNORMAL HIGH (ref 4.0–10.5)
nRBC: 0 % (ref 0.0–0.2)

## 2022-08-06 LAB — BRAIN NATRIURETIC PEPTIDE: B Natriuretic Peptide: 565.4 pg/mL — ABNORMAL HIGH (ref 0.0–100.0)

## 2022-08-06 LAB — CYTOLOGY - NON PAP

## 2022-08-06 LAB — LACTIC ACID, PLASMA: Lactic Acid, Venous: 0.7 mmol/L (ref 0.5–1.9)

## 2022-08-06 LAB — PROTEIN, TOTAL: Total Protein: 6.1 g/dL — ABNORMAL LOW (ref 6.5–8.1)

## 2022-08-06 LAB — PROCALCITONIN: Procalcitonin: 0.81 ng/mL

## 2022-08-06 LAB — LACTATE DEHYDROGENASE: LDH: 133 U/L (ref 98–192)

## 2022-08-06 LAB — MAGNESIUM: Magnesium: 2.4 mg/dL (ref 1.7–2.4)

## 2022-08-06 LAB — TROPONIN I (HIGH SENSITIVITY): Troponin I (High Sensitivity): 2 ng/L (ref <18)

## 2022-08-06 LAB — PHOSPHORUS: Phosphorus: 3.1 mg/dL (ref 2.5–4.6)

## 2022-08-06 LAB — PROTEIN, PLEURAL OR PERITONEAL FLUID: Total protein, fluid: 4.4 g/dL

## 2022-08-06 MED ORDER — ENOXAPARIN SODIUM 40 MG/0.4ML IJ SOSY
40.0000 mg | PREFILLED_SYRINGE | INTRAMUSCULAR | Status: DC
  Administered 2022-08-06 – 2022-08-29 (×21): 40 mg via SUBCUTANEOUS
  Filled 2022-08-06 (×22): qty 0.4

## 2022-08-06 MED ORDER — SODIUM CHLORIDE 0.9 % IV SOLN
250.0000 mL | INTRAVENOUS | Status: DC
  Administered 2022-08-06: 250 mL via INTRAVENOUS

## 2022-08-06 MED ORDER — LINEZOLID 600 MG/300ML IV SOLN: 600.0000 mg | Freq: Two times a day (BID) | INTRAVENOUS | Status: DC

## 2022-08-06 MED ORDER — CEFAZOLIN SODIUM-DEXTROSE 2-4 GM/100ML-% IV SOLN
2.0000 g | Freq: Three times a day (TID) | INTRAVENOUS | Status: DC
  Administered 2022-08-06 – 2022-08-28 (×67): 2 g via INTRAVENOUS
  Filled 2022-08-06 (×67): qty 100

## 2022-08-06 MED ORDER — PHENYLEPHRINE HCL-NACL 20-0.9 MG/250ML-% IV SOLN
0.0000 ug/min | INTRAVENOUS | Status: DC
  Administered 2022-08-06: 35 ug/min via INTRAVENOUS
  Administered 2022-08-06 – 2022-08-07 (×2): 25 ug/min via INTRAVENOUS
  Administered 2022-08-07: 45 ug/min via INTRAVENOUS
  Filled 2022-08-06 (×3): qty 250

## 2022-08-06 MED ORDER — LACTATED RINGERS IV BOLUS
1000.0000 mL | Freq: Once | INTRAVENOUS | Status: AC
  Administered 2022-08-06: 1000 mL via INTRAVENOUS

## 2022-08-06 MED ORDER — HYDROMORPHONE HCL 2 MG PO TABS
4.0000 mg | ORAL_TABLET | Freq: Four times a day (QID) | ORAL | Status: DC | PRN
  Administered 2022-08-08 – 2022-08-11 (×8): 4 mg via ORAL
  Filled 2022-08-06 (×9): qty 2

## 2022-08-06 MED ORDER — PHENYLEPHRINE HCL-NACL 20-0.9 MG/250ML-% IV SOLN
INTRAVENOUS | Status: AC
  Administered 2022-08-06: 25 ug/min via INTRAVENOUS
  Filled 2022-08-06: qty 250

## 2022-08-06 MED ORDER — LINEZOLID 600 MG/300ML IV SOLN
600.0000 mg | Freq: Two times a day (BID) | INTRAVENOUS | Status: DC
  Filled 2022-08-06: qty 300

## 2022-08-06 MED ORDER — PHENYLEPHRINE HCL-NACL 20-0.9 MG/250ML-% IV SOLN
25.0000 ug/min | INTRAVENOUS | Status: DC
  Filled 2022-08-06: qty 250

## 2022-08-06 NOTE — H&P (Addendum)
NAME:  [name], MRN:  [id number], DOB:  [birth year], LOS: 2 ADMISSION DATE:  08/02/2022, CONSULTATION DATE:  08/05/22 REFERRING MD:  Gershon Cull , CHIEF COMPLAINT:   Lethargy, ams   BRIEF  [name] is a 61 yo woman with a hx of metastatic breast cancer, HTN, admitted 11/4 with decreased appetite, poor po intake , weakness, lethargy; transferred to ICU tonight due to hypotension and lethargy.  On oral therapy for cancer:  +SOB, weakness  11/5 HR 130-140  Afib.  Given 2.5 mg iV metop when back to sinus. 500cc LR bolus.   11/5 IR thora 800cc yellow fluid removed  Stopped due to pain Afib again 11/6 Developed afib with RVR 11/6 given bolus and lopressor again 845.    Unresponsive tonight, responded to narcan.    Hospital meds: lovenox ppx, pepcid , megace, metop at 1318 pm 2009 pm today 0035 this am  Narcan 0.4 mg at 2219, 0026 Dilaudid '8mg'$  po 1053, 1701  Micro pleural effusion:  ABUNDANT GRAM POSITIVE COCCI IN CLUSTERS  RARE WBC PRESENT, PREDOMINANTLY PMN  IN BOTH AEROBIC AND ANAEROBIC BOTTLES   7/242/64.7/164, UA neg on admission  CXR : white out of R lung Pertinent  Medical History  HTN Breast cancer - R chest wall, contralateral breast (L. Mets to lung and bones.  Responding well to chemo/antiestrogen.   Failutre to thrive, weight loss, malignant cachexia  Malignant pleural effusion R   Home meds: abemaciclib, arimidex, calcium carbonate, vit D, Dilaudid '8mg'$  q6 prn  Mag oxide, mirtazapine 7.'5mg'$  qhs  Miralax, senna   Flagyl gel      Significant Hospital Events: Including procedures, antibiotic start and stop dates in addition to other pertinent events   03/23/2022: Right-sided thoracentesis: LDH 259.  White cells 207.  8% polymorphs. Malignant cells consistent with breast cancer present xxxxxxxxxxxxxxxxxx 08/02/2022 - admit MRSA PCR neg 08/03/2022: Right thoracentesis 800 mL and stopped due to pain LDH 439, white cell 7100/84% polymorphs > huge increase in  white count and left shift. MSSA gorwing from pleural fluids 08/03/22 08/05/22 - ccm consult and ICU tx Left groin CVL Left groin a-line  08/05/22 -last Narcan was at 4 AM.  Now completely awake on BiPAP.  Wants BiPAP off.  No respiratory distress.  Remains on vasopressors Levophed 9 mcg and vasopressin.  Diastolic is low at 40 with map of 71.  She has left femoral arterial line and central line.  She is oriented and communicating.  She remains afebrile.  White count is high.  She is on antibiotics vancomycin and cefepime. ECHO - ef65%. RSVP 45+., Mild to mod TR. Mild MR Family not in favor for pleurx. Dr Alvy Bimler recommending 1 more thora  Interim History / Subjective:   08/06/22 -off pressors yesterday day time but back on at night. On 4L [place]. Pleural fluids gorinwg MSSA. Thora requesting - stil pending . Has low diastoloic. No delirium this morning. Awake and ate breakfast.   Objective   Blood pressure (!) 107/41, pulse 79, temperature 97.9 F (36.6 C), temperature source Axillary, resp. rate 15, height '5\' 6"'$  (1.676 m), weight 53.8 kg, SpO2 100 %.        Intake/Output Summary (Last 24 hours) at 08/06/2022 1001 Last data filed at 08/06/2022 0700 Gross per 24 hour  Intake 1794.91 ml  Output 100 ml  Net 1694.91 ml   Filed Weights   08/04/22 0500 08/04/22 2315 08/05/22 0500  Weight: 52 kg 53.8 kg 53.8 kg    General:  No distress. Lean cachectic Neuro: Alert and Oriented x 3. GCS 15. Speech normal Psych: Pleasant Resp:  Barrel Chest - no.  Wheeze - no, Crackles - no, No overt respiratory distress CVS: Normal heart sounds. Murmurs - no Ext: Stigmata of Connective Tissue Disease - no HEENT: Normal upper airway. PEERL +. No post nasal drip     Resolved Hospital Problem list    Recurrent malignant right-sided pleural effusion [large] - Prior to & Present on Admit  -First diagnosis March 23, 2022  -Most recent thoracentesis 08/03/2022 - c/w MSSA empyema  Acute respiratory acidosis  hypercapnic respiratory failure 08/04/2022 resulting in ICU transfer and BiPAP   - due to septic shock and effusion  11/8 on 3L [place]. Still awaiting repeat thora. Family/Dr Alvy Bimler not in favor for pleurx for malignant effusion but now has confirmed empyema (MSSA). Able to be off bipap   P:   Oxygen for pulse ox greater than 92% [has dark skin] Await thora but needs pigtail by IR given MSSA  - per sister and aptient 08/06/22 : not interested in tube but both are ok for repeat thora (RN calling us)    Chronic cancer related pain -on opioids at home Chronic depression on Remeron at home Acute obtunded encephalopathy 08/04/2022 requiring BiPAP and Narcan pushes - resolved 08/05/22  08/06/22 - only 1 priin dilaudid x 24h. She is filling prescription for '8mg'$  dilaudid po q6-q8h prn  P:   Monitor Opioids for pain but reduce dosing (has not needed much in 24h) RASS goal 0 to -2   Circulatory shock onset 08/04/2022 -likely due to septic shock MSSA empyema R pleural space  95/6/38: Has low diastolic.   On neo. Back on pressors despite midodrine. Cortisol 20  P:  Fluid bolus x 1 08/06/22 Blood pressure goal to systolic greater than 756 MAP greater than 55 Neo to continue Continue midodrine since 08/05/22 Cotninue hydrocortisone since 08/05/22 and taper off from 08/07/22 DC a-line Aim to dc femoral line in by 08/08/22   Transient atrial fibrillation at admission 08/03/2022 -troponin normal  08/05/2022: EKG NSR.  Troponin normal, . eCHO unremarkabble  P: Tele EKG as neeed     MSSA Rt Pleural fluid empyema - prsent at admit (confoirmed on results 08/06/22) Concern for septic shock decom 08/04/22 at time of ICU transfer  11/8  -  afebrile snce 08/02/22  P:   Cefazolin 11/8 (MSSA empyema) >> Linezoldi 08/05/22 >>11/8 Vanc 08/05/22  - 08/05/22 Cefepime 08/05/22 >>08/05/22  Needs chest tube management    New onset acute kidney injury creatinine 1.85 mg percent 08/05/2022 [baseline creatinine  0.7 mg percent  08/06/22 - creat 1.'3mg'$ % and better  P:  Maintain hemodynamics Avoid nephrotoxins PureWick  Fluids and monitor    Hypocalcemia -likely baseline issue.  On elemental calcium at home  11/7 - low calcium repleted   P: Monitoir    Severe cachexia and PCM - baseline alb 2.4 at admit Low appeitie - on Megace  P:   Cotninue megace RC doncult    Chronic anemia - last transfusion likely June 2023 (baseline 9-10gm%)  11/8 -  hgb 8gm% . No actoive bleeding   P:  - PRBC for hgb </= 6.9gm%    - exceptions are   -  if ACS susepcted/confirmed then transfuse for hgb </= 8.0gm%,  or    -  active bleeding with hemodynamic instability, then transfuse regardless of hemoglobin value   At at all times try to transfuse 1 unit  prbc as possible with exception of active hemorrhage    Thrombocytosis - Baseline 400-600K - Prior to & Present on Admit  11/8 - similar to baseline  P Lovenox for dvt proph Monitfor for drop   AT risk for hypo and hyperglycemia    P:   SSI  Oncology Advanced breast cancer - On Verzenio and Arimidex with malignant pleural effusion- Prior to & Present on Admit  Plan  - per onc  MSK/DERM Profound physical deconditioning Performed debility Profound cachexia Profound failure to thrivae - on megace Vit D def - on replacement Severe protein calorie malnutrition  All above prior to admission  Plan  - PT/OT - Nutrition -Megace - Might need SNF   Best Practice (right click and "Reselect all SmartList Selections" daily)  Diet/type: NPO - clearl liquid diet DVT prophylaxis: other GI prophylaxis: PEPCID oral Lines: Central line  - and al ine - both left femoral 08/05/22 -. DC aline  08/06/22  Foley:  PUREWICK  Code Status:  full code  Last date of multidisciplinary goals of care discussion  - pall care called - goal of care needed. Dr Alvy Bimler involved in family discussions and aprpeciated  - 11/7/23P Consider pleurx -Called  sister Alyda Megna [phone] 8:41am and updated - declined PleurX - 08/06/22 - Called sister Tziporah Knoke:   - explained empyema main issue and antibiotics necessary but not sufficient. Chest tube indicated but sister/mother prefer attempt at thora x 1 and not tube.  D/w patient - she does not want tube. So, continue antibiotics. Took shared decision making to hold off chest tube.    - Sister and mom were wondering about a thora ordered in office in summer 2023 and caused pain. They feel that contributed to distress.  Explained tumor burden changes over time      ATTESTATION & SIGNATURE   The patient Francelia Mclaren is critically ill with multiple organ systems failure and requires high complexity decision making for assessment and support, frequent evaluation and titration of therapies, application of advanced monitoring technologies and extensive interpretation of multiple databases.   Critical Care Time devoted to patient care services described in this note is  60  Minutes. This time reflects time of care of this signee Dr Brand Males. This critical care time does not reflect procedure time, or teaching time or supervisory time of PA/NP/Med student/Med Resident etc but could involve care discussion time     Dr. Brand Males, M.D., [facility].C.P Pulmonary and Critical Care Medicine Medical Director - [facility] ICU Staff Physician, [facility] Pager: [phone], If no answer or between  15:00h - 7:00h: call [phone]  08/06/2022 10:47 AM    LABS    PULMONARY Recent Labs  Lab 08/05/22 0110 08/05/22 2255  PHART 7.242* 7.24*  PCO2ART 64.7* 60*  PO2ART 164* 95  HCO3 28.0 25.7  TCO2 30  --   O2SAT 99 99.9    CBC Recent Labs  Lab 08/03/22 0705 08/05/22 0110 08/05/22 0458 08/06/22 0456  HGB 10.9* 10.2* 9.0* 8.0*  HCT 35.5* 30.0* 29.8* 26.1*  WBC 11.8*  --  11.8* 13.7*  PLT 497*  --  586* 565*     COAGULATION Recent Labs  Lab 08/05/22 0458 08/06/22 0456  INR 1.4* 1.3*    CARDIAC  No results for input(s): "TROPONINI" in the last 168 hours. No results for input(s): "PROBNP" in the last 168 hours.  CHEMISTRY Recent Labs  Lab 08/02/22 1900 08/03/22 0705 08/05/22 0110 08/05/22 0458 08/06/22 0456  NA 136 132* 133* 136 136  K 3.5 3.7 3.8 4.0 4.2  CL 102 100  --  103 102  CO2 27 27  --  25 28  GLUCOSE 107* 130*  --  129* 124*  BUN 19 15  --  33* 35*  CREATININE 0.70 0.71  --  1.85* 1.38*  CALCIUM 7.0* 7.1*  --  7.1* 8.0*  MG  --  2.2  --  2.4 2.4  PHOS  --   --   --   --  3.1   Estimated Creatinine Clearance: 35.9 mL/min (A) (by C-G formula based on SCr of 1.38 mg/dL (H)).   LIVER Recent Labs  Lab 08/02/22 1900 08/03/22 0705 08/05/22 0458 08/06/22 0456  AST 44* '27 22 18  '$ ALT '28 29 23 21  '$ ALKPHOS 52 53 50 49  BILITOT 0.7 0.4 0.3 0.4  PROT 6.3* 6.7 6.3* 6.0*  ALBUMIN 2.4* 2.6* 2.4* 2.3*  INR  --   --  1.4* 1.3*     INFECTIOUS Recent Labs  Lab 08/05/22 0458 08/05/22 0828 08/06/22 0456  LATICACIDVEN  --  0.6 0.7  PROCALCITON 0.97  --  0.81     ENDOCRINE CBG (last 3)  Recent Labs    08/04/22 2322  GLUCAP 124*         IMAGING x48h  - image(s) personally visualized  -   highlighted in bold DG Chest Port 1 View  Result Date: 08/05/2022 CLINICAL DATA:  Shortness of breath EXAM: PORTABLE CHEST 1 VIEW COMPARISON:  08/05/2022, 06/25/2022, 08/03/2022 FINDINGS: Complete opacification of right thorax without change. Subsegmental atelectasis left base. Partially obscured cardiomediastinal silhouette. IMPRESSION: Complete opacification of the right thorax without change, probably due to sizable pleural effusion. Subsegmental atelectasis left base Electronically Signed   By: Donavan Foil M.D.   On: 08/05/2022 23:33   ECHOCARDIOGRAM COMPLETE  Result Date: 08/05/2022    ECHOCARDIOGRAM REPORT   Patient Name:   SANDREA BOER Date of Exam:  08/05/2022 Medical Rec #:  [id number]       Height:       66.0 in Accession #:    [id number]      Weight:       118.6 lb Date of Birth:  May 26, 1961        BSA:          1.602 m Patient Age:    23 years        BP:           97/44 mmHg Patient Gender: F               HR:           78 bpm. Exam Location:  Inpatient Procedure: 2D Echo, Cardiac Doppler and Color Doppler Indications:    R94.31 Abnormal EKG  History:        Patient has no prior history of Echocardiogram examinations.                 Risk Factors:Hypertension. Metastatic breast cancer.  Sonographer:    Ages Referring Phys: [id number] AMRIT ADHIKARI  Sonographer Comments: Technically difficult study due to poor echo windows, suboptimal apical window and suboptimal subcostal window. Apical images off axis due to the hardened texture of the left breast. Could not turn patient. Wound dressings in subcostal region. IMPRESSIONS  1. Left ventricular ejection fraction, by estimation, is 55  to 60%. The left ventricle has normal function. The left ventricle has no regional wall motion abnormalities. Left ventricular diastolic parameters were normal.  2. Right ventricular systolic function is normal. The right ventricular size is normal. Mildly increased right ventricular wall thickness. There is mildly elevated pulmonary artery systolic pressure. The estimated right ventricular systolic pressure is 82.9 mmHg.  3. Left atrial size was mildly dilated.  4. A small pericardial effusion is present. The pericardial effusion is circumferential. There is no evidence of cardiac tamponade. Large pleural effusion in the right lateral region.  5. 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 Tricuspid valve regurgitation is mild to moderate.  7. The aortic valve is tricuspid. Aortic valve regurgitation is not visualized. No aortic stenosis is  present.  8. The inferior vena cava is normal in size with greater than 50% respiratory variability, suggesting right atrial pressure of 3 mmHg. FINDINGS  Left Ventricle: Left ventricular ejection fraction, by estimation, is 55 to 60%. The left ventricle has normal function. The left ventricle has no regional wall motion abnormalities. The left ventricular internal cavity size was normal in size. There is  no left ventricular hypertrophy. Left ventricular diastolic parameters were normal. Right Ventricle: The right ventricular size is normal. Mildly increased right ventricular wall thickness. Right ventricular systolic function is normal. There is mildly elevated pulmonary artery systolic pressure. The tricuspid regurgitant velocity is 3.19 m/s, and with an assumed right atrial pressure of 3 mmHg, the estimated right ventricular systolic pressure is 93.7 mmHg. Left Atrium: Left atrial size was mildly dilated. Right Atrium: Right atrial size was normal in size. Pericardium: Complex septated fluid collection in the right pleural cavity, consider empyema or malignant effusion. A small pericardial effusion is present. The pericardial effusion is circumferential. There is no evidence of cardiac tamponade. 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 There is mild thickening of the mitral valve leaflet(s). Mild mitral valve regurgitation, with centrally-directed jet. Tricuspid Valve: The tricuspid valve is myxomatous. Tricuspid valve regurgitation is mild to moderate. Aortic Valve: The aortic valve is tricuspid. Aortic valve regurgitation is not visualized. No aortic stenosis is present. Pulmonic Valve: The pulmonic valve was normal in structure. Pulmonic valve regurgitation is mild. Aorta: The aortic root and ascending aorta are structurally normal, with no evidence of dilitation. Venous: IVC assessment for right atrial pressure unable to be  performed due to mechanical ventilation. The inferior vena cava is normal in size with greater than 50% respiratory variability, suggesting right atrial pressure of 3 mmHg. IAS/Shunts: No atrial level shunt detected by color flow Doppler. Additional Comments: There is a large pleural effusion in the right lateral region.  LEFT VENTRICLE PLAX 2D LVIDd:         4.43 cm     Diastology LVIDs:         2.80 cm     LV e' medial:    5.08 cm/s LV PW:         1.03 cm     LV E/e' medial:  10.3 LV IVS:        0.87 cm     LV e' lateral:   6.46 cm/s LVOT diam:     2.10 cm     LV E/e' lateral: 8.1 LV SV:  44 LV SV Index:   28 LVOT Area:     3.46 cm  LV Volumes (MOD) LV vol d, MOD A4C: 57.5 ml LV vol s, MOD A4C: 18.7 ml LV SV MOD A4C:     57.5 ml RIGHT VENTRICLE             IVC RV S prime:     13.10 cm/s  IVC diam: 1.10 cm TAPSE (M-mode): 1.7 cm LEFT ATRIUM           Index        RIGHT ATRIUM           Index LA diam:      3.20 cm 2.00 cm/m   RA Area:     14.20 cm LA Vol (A4C): 57.5 ml 35.90 ml/m  RA Volume:   35.90 ml  22.42 ml/m  AORTIC VALVE             PULMONIC VALVE LVOT Vmax:   75.15 cm/s  PR End Diast Vel: 2.14 msec LVOT Vmean:  45.850 cm/s LVOT VTI:    0.127 m  AORTA Ao Root diam: 2.60 cm Ao Asc diam:  2.70 cm MITRAL VALVE               TRICUSPID VALVE MV Area (PHT): 4.89 cm    TR Peak grad:   40.7 mmHg MV Decel Time: 155 msec    TR Vmax:        319.00 cm/s MV E velocity: 52.40 cm/s MV A velocity: 50.00 cm/s  SHUNTS MV E/A ratio:  1.05        Systemic VTI:  0.13 m                            Systemic Diam: 2.10 cm Dani Gobble Croitoru MD Electronically signed by Sanda Klein MD Signature Date/Time: 08/05/2022/12:38:14 PM    Final    DG CHEST PORT 1 VIEW  Result Date: 08/05/2022 CLINICAL DATA:  Right pleural effusion. EXAM: PORTABLE CHEST 1 VIEW COMPARISON:  08/05/2022 FINDINGS: Stable cardiomediastinal contours. Left lung is clear. Complete opacification of the right hemithorax is again noted and appears  unchanged. Findings are favored to represent known large right pleural effusion. IMPRESSION: 1. No change in complete opacification of the right hemithorax which is presumed to represent large right pleural effusion. Electronically Signed   By: Kerby Moors M.D.   On: 08/05/2022 10:06   DG CHEST PORT 1 VIEW  Result Date: 08/05/2022 CLINICAL DATA:  Respiratory compromise EXAM: PORTABLE CHEST 1 VIEW COMPARISON:  08/03/2022 FINDINGS: Complete opacification of the right hemithorax, presumably reflecting a large pleural effusion. Left lung is clear. No pneumothorax. The heart is normal in size. IMPRESSION: Complete opacification of the right hemithorax, presumably reflecting a large pleural effusion. Electronically Signed   By: Julian Hy M.D.   On: 08/05/2022 00:41

## 2022-08-06 NOTE — Progress Notes (Signed)
D/w Dr Alvy Bimler  Explained need for pigtail given MSSA infection. She has had ongoing goals of care with patient/family. They have consistently declined tube intervention but still patient full code. She will address goals again 08/07/22.     SIGNATURE    Dr. Brand Males, M.D., F.C.C.P,  Pulmonary and Critical Care Medicine Staff Physician, [place] Director - Interstitial Lung Disease  Program  Medical Director - [place] [facility] [place] at [place], [place], [address]   Pager: [phone], If no answer  -[place] or [place] [phone] Telephone (clinical office): [phone] Telephone (research): [phone]  12:38 PM 08/06/2022

## 2022-08-06 NOTE — TOC Initial Note (Signed)
Transition of Care [facility]) - Initial/Assessment Note    Patient Details  Name: [name] MRN: [id number] Date of Birth: [birth year]  [facility]) CM/SW Contact:    Dessa Phi, RN Phone Number: 08/06/2022, 10:01 AM  Clinical Narrative:                 Await PT recc.  Expected Discharge Plan: Skilled Nursing Facility Barriers to Discharge: Continued Medical Work up   Patient Goals and CMS Choice Patient states their goals for this hospitalization and ongoing recovery are:: to go home and be well CMS Medicare.gov Compare Post Acute Care list provided to:: Patient Choice offered to / list presented to : Patient  Expected Discharge Plan and Services Expected Discharge Plan: [place]   Discharge Planning Services: CM Consult   Living arrangements for the past 2 months: Apartment                                      Prior Living Arrangements/Services Living arrangements for the past 2 months: Apartment Lives with:: Self Patient language and need for interpreter reviewed:: Yes Do you feel safe going back to the place where you live?: Yes            Criminal Activity/Legal Involvement Pertinent to Current Situation/Hospitalization: No - Comment as needed  Activities of Daily Living Home Assistive Devices/Equipment: None ADL Screening (condition at time of admission) Patient's cognitive ability adequate to safely complete daily activities?: Yes Is the patient deaf or have difficulty hearing?: No Does the patient have difficulty seeing, even when wearing glasses/contacts?: No Does the patient have difficulty concentrating, remembering, or making decisions?: No Patient able to express need for assistance with ADLs?: Yes Does the patient have difficulty dressing or bathing?: No Independently performs ADLs?: Yes (appropriate for developmental age) Does the patient have difficulty walking or climbing stairs?: Yes Weakness of Legs:  Both Weakness of Arms/Hands: Both  Permission Sought/Granted                  Emotional Assessment Appearance:: Appears stated age Attitude/Demeanor/Rapport: Gracious Affect (typically observed): Calm Orientation: : Oriented to Self, Oriented to Place, Oriented to  Time, Oriented to Situation Alcohol / Substance Use: Never Used Psych Involvement: No (comment)  Admission diagnosis:  Severe protein-calorie malnutrition ([place]) [E43] Malignant pleural effusion [J91.0] Pleural effusion on right [J90] Primary malignant neoplasm of right breast ([place]) [C50.911] Pleural effusion, right [J90] Patient Active Problem List   Diagnosis Date Noted   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   Need for emotional support 08/05/2022   Constipation 08/05/2022   Palliative care encounter 08/05/2022   Protein-calorie malnutrition, severe 08/05/2022   Pleural effusion, right 08/04/2022   Pleural effusion on right 08/02/2022   Protein calorie malnutrition ([place]) 08/02/2022   Goals of care, counseling/discussion 07/25/2022   Vitamin D deficiency 06/12/2022   Situational depression 05/30/2022   GERD (gastroesophageal reflux disease) 05/30/2022   Pancytopenia, acquired ([place]) 05/08/2022   Wound healing, delayed 05/08/2022   Physical debility 04/17/2022   Poor social situation 04/02/2022   Cancer associated pain 03/31/2022   Poor venous access 03/31/2022   Other constipation 03/31/2022   Malnutrition of moderate degree 03/25/2022   Breast cancer metastasized to bone, right ([place]) 03/23/2022   Primary breast malignancy ([place]) 03/22/2022   Pleural effusion 03/22/2022   Cellulitis 03/22/2022   Microcytic anemia 03/22/2022   Thrombocytosis 03/22/2022  Acute renal failure (ARF) ([place]) 03/22/2022   Lactic acidosis 03/22/2022   Essential hypertension 01/27/2018   Screening for diabetes mellitus 01/27/2018   Screening for thyroid disorder 01/27/2018   PCP:  Heath Lark, MD Pharmacy:   [facility] [place] [address] Phone: [phone] Fax: [phone]     Social Determinants of Health (SDOH) Interventions    Readmission Risk Interventions     No data to display

## 2022-08-06 NOTE — Progress Notes (Signed)
Patient experienced what she described as "severe anxiety attack." Was saying to this RN "I need to get my feet on the floor. I need to get on the floor." Patient was labored breathing with Rate in 40's. 0.'5mg'$  of IV dilaudid given per [facility], patient assisted to first dangle feet on edge of bed, then assisted to standing positing at side of bed. 1 person moderate assistance required. Patient able to stand for approximately 5 min. Partial bath given and bed change completed.patient assisted back to bed. Peri care completed and purwick changed. Patient stated she just needed to move because her pain was so bad and she needed to be in an upright position. Patient now resting comfortably in the bed, family at bedside to witness the event. Very thankful to staff and this RN for assistance with alleviating pain and anxiety. Patient expressed her thanks as well. Warm blankets provided to patient, bed in low position and call light in reach.

## 2022-08-06 NOTE — Procedures (Addendum)
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 Was called emergently to bedside. Patient in rapid decompensationresp distress and A Fib. Got brady as well. Was waiting for [place] thora by Ir since 08/05/22. . Had declined chest tube for empyema. She and family only agreed for thora but now emergent  . CXR with white out. D/w patient and explained emergent resp relief indicated. She agreed. Per her goals: only will do thora based on her goals and patient family  Patient on neo pre- and peri procedure    Consent Risks of the procedure as well as the alternatives and risks of each were explained to the patient and/or caregiver.  Consent for the procedure was obtained and is signed in the bedside chart  Anesthesia Topical only with 1% lidocaine    Time Out Verified patient identification, verified procedure, site/side was marked, verified correct patient position, special equipment/implants available, medications/allergies/relevant history reviewed, required imaging and test results available.   Sterile Technique Maximal sterile technique was not able to be achieved due to emergent nature of procedure. - but sterile gloves and clean prep was done but operatores not gowned.   Procedure Description Ultrasound was used to identify appropriate pleural anatomy for placement and overlying skin marked.  Area of drainage cleaned and draped in sterile fashion. Lidocaine was used to anesthetize the skin and subcutaneous tissue.  1100 cc's of dark amber cloudy appearing fluid was drained from the right pleural space. Catheter then removed and bandaid applied to site.   Complications/Tolerance None; patient tolerated the procedure well. Chest X-ray is ordered to confirm no  post-procedural complication.   EBL none   Specimen(s) Pleural fluid - LDH, gluc, gram stain cuture but no cytology   Called sister 12:48 PM 08/06/2022 : updated of above. She was asking why radiology had not done the proceudre. Explained that there was emergent need and we had to take a clinical decisin to do it at bedside by CCM team. Gave update of her post procedure status -resting calmly on o2 and on neo   SIGNATURE    Dr. Brand Males, M.D., F.C.C.P,  Pulmonary and Critical Care Medicine Staff Physician, [place] Director - Interstitial Lung Disease  Program  Medical Director - [facility] [place] [facility] [place], [place], [address]   Pager: [phone], If no answer  -[place] or [place] [phone] Telephone (clinical office): [phone] Telephone (research): [phone]  12:36 PM 08/06/2022

## 2022-08-06 NOTE — Telephone Encounter (Signed)
pt sister Hilda Blades spoke w [facility] & Dr. Vaughan Browner states Per Dr.Mannam future appts should be with Dr.Mannam nothing further needed

## 2022-08-06 NOTE — Evaluation (Signed)
Physical Therapy Evaluation Patient Details Name: [name] MRN: [id number] DOB: [birth year] Today's Date: 08/06/2022  History of Present Illness  s [name] is a 61 yo woman with a hx of metastatic breast cancer, HTN, admitted 11/4 with decreased appetite, poor po intake , weakness, lethargy,persistent pleural effusion despite thoracentesis, FTT, transfer to iCU 11/6 for decreased responsiveness and  low BP.  Clinical Impression  Patient admitted for above problems.  Patient  restless in bed, RN in room. Patient requiring linens to be changed. Assisted in bed  mobility  for rolling. At times, patient sits self upright in bed. Patient currently has Left femoral art line and  is  not able to get to recliner.  Patient  does assist with rolling with mod assistance.  Continue PT to complete  mobility assessment.   Pt admitted with above diagnosis.  Pt currently with functional limitations due to the deficits listed below (see PT Problem List). Pt will benefit from skilled PT to increase their independence and safety with mobility to allow discharge to the venue listed below.  Patient with noted SOB with activity, SPO2 9#%, then sensors were dislodged.       Recommendations for follow up therapy are one component of a multi-disciplinary discharge planning process, led by the attending physician.  Recommendations may be updated based on patient status, additional functional criteria and insurance authorization.  Follow Up Recommendations Skilled nursing-short term rehab (<3 hours/day) (unsure) Can patient physically be transported by private vehicle: No    Assistance Recommended at Discharge Frequent or constant Supervision/Assistance  Patient can return home with the following  A lot of help with walking and/or transfers;A lot of help with bathing/dressing/bathroom;Assistance with cooking/housework;Assist for transportation    Equipment Recommendations  (TBD)  Recommendations for Other  Services       Functional Status Assessment Patient has had a recent decline in their functional status and demonstrates the ability to make significant improvements in function in a reasonable and predictable amount of time.     Precautions / Restrictions Precautions Precautions: Fall Precaution Comments: , SOB on 4L      Mobility  Bed Mobility Overal bed mobility: Needs Assistance Bed Mobility: Rolling Rolling: Mod assist         General bed mobility comments: to roll to each side for linen change    Transfers                   General transfer comment: deferred, on bedrest for fem line    Ambulation/Gait                  Stairs            Wheelchair Mobility    Modified Rankin (Stroke Patients Only)       Balance Overall balance assessment: Needs assistance     Sitting balance - Comments: sits  up partially in bed, restlessness, noted bed  soiled                                     Pertinent Vitals/Pain Pain Assessment Pain Assessment: Faces Faces Pain Scale: Hurts even more Breathing: occasional labored breathing, short period of hyperventilation Facial Expression: Grimacing Body Movements: Restlessness Muscle Tension: Tense, rigid Vocalization (extubated pts.): Sighing, moaning Pain Location: abdomen , side Pain Descriptors / Indicators: Discomfort, Moaning, Grimacing, Restless Pain Intervention(s): Monitored during session, Patient requesting pain meds-RN notified  Home Living Family/patient expects to be discharged to:: Private residence Living Arrangements: Alone                 Additional Comments: unsure, family not availble , patient SOB and unable to answer    Prior Function               Mobility Comments: unsure- indicates weakness leading to admission       Hand Dominance        Extremity/Trunk Assessment   Upper Extremity Assessment Upper Extremity Assessment: Generalized  weakness    Lower Extremity Assessment Lower Extremity Assessment: Generalized weakness    Cervical / Trunk Assessment Cervical / Trunk Assessment: Other exceptions Cervical / Trunk Exceptions: noted to sit upright in bed using rails  Communication   Communication: No difficulties (SOB , diff to  speak)  Cognition Arousal/Alertness: Awake/alert Behavior During Therapy: Restless Overall Cognitive Status: No family/caregiver present to determine baseline cognitive functioning                                 General Comments: follows simple diretions with incr. time, oriented to hosputal and reson being SOB        General Comments      Exercises     Assessment/Plan    PT Assessment Patient needs continued PT services  PT Problem List Decreased strength;Decreased mobility;Decreased safety awareness;Decreased knowledge of precautions;Decreased activity tolerance;Cardiopulmonary status limiting activity;Decreased knowledge of use of DME;Pain       PT Treatment Interventions DME instruction;Therapeutic activities;Therapeutic exercise;Gait training;Patient/family education;Functional mobility training    PT Goals (Current goals can be found in the Care Plan section)  Acute Rehab PT Goals Patient Stated Goal: to get OOB PT Goal Formulation: With patient Time For Goal Achievement: 08/20/22 Potential to Achieve Goals: Fair    Frequency Min 2X/week     Co-evaluation               AM-PAC PT "6 Clicks" Mobility  Outcome Measure Help needed turning from your back to your side while in a flat bed without using bedrails?: A Lot Help needed moving from lying on your back to sitting on the side of a flat bed without using bedrails?: A Lot Help needed moving to and from a bed to a chair (including a wheelchair)?: Total Help needed standing up from a chair using your arms (e.g., wheelchair or bedside chair)?: Total Help needed to walk in hospital room?:  Total Help needed climbing 3-5 steps with a railing? : Total 6 Click Score: 8    End of Session   Activity Tolerance: Patient limited by fatigue Patient left: in bed;with call bell/phone within reach;with nursing/sitter in room (curently on bed rest) Nurse Communication: Mobility status PT Visit Diagnosis: Unsteadiness on feet (R26.81);Adult, failure to thrive (R62.7);Muscle weakness (generalized) (M62.81)    Time: 2841-3244 PT Time Calculation (min) (ACUTE ONLY): 30 min   Charges:   PT Evaluation $PT Eval Low Complexity: 1 Low PT Treatments $Therapeutic Activity: 8-22 mins        [place] Office [phone] Weekend [phone]   Claretha Cooper 08/06/2022, 10:03 AM

## 2022-08-06 NOTE — Progress Notes (Signed)
[facility] Progress Note Patient Name: [name] DOB: [birth year] MRN: [id number]   Date of Service  08/06/2022  HPI/Events of Note  Patient is now resting, however BP is soft.  eICU Interventions  Phenylephrine gtt ordered to maintain parameters of SBP > 105, MAP > 60.        Kerry Kass Taniqua Issa 08/06/2022, 1:47 AM

## 2022-08-06 NOTE — Progress Notes (Signed)
[name] currently has good waveform, draws and flushes, site WNL.

## 2022-08-06 NOTE — Progress Notes (Signed)
Calorie Count Note  48 hour calorie count ordered.  Estimated Nutritional Needs:  Kcal:  1675-2015kcal (Mifflin x 1.5-1.8, REE: 1119) Protein:  80-110g (1.5-2.0g/kg)  Diet: Clear Liquid Supplements: Boost Breeze TID  Breakfast: skipped Lunch: 100% orange jello, 100% lemon fruit ice, 50% cranberry juice Dinner: skipped Supplements: 2 Boost Breeze  Total intake: 685 kcal (41% of minimum estimated needs)  18g protein (23% of minimum estimated needs) Pt is not meeting estimated nutrient needs. Anticipate improved calorie and protein intake with diet advancement.  Nutrition Dx: severe protein calorie malnutrition r/t metastatic cancer AEB significant 17.4% unintended weight loss in 4 months, severe muscle wasting and severe fat loss.  Goal: Consume >90% estimated nutrient needs  Intervention: Advance diet as clinically appropriate. Provide diet compliant ONS, currently Boost Breeze TID. Provide bowel regimen (last BM 11/6)  Candise Bowens, MS, RD, LDN, CNSC See AMiON for contact information

## 2022-08-06 NOTE — Progress Notes (Signed)
Patient became hypotensive requiring vasopressors overnight.  Discussed with PCCM Dr. Chase Caller who will take patient back to ICU under his care.  Hospitalist signing off.  We will be happy to resume care once patient is medically stable and off of vasopressors.

## 2022-08-06 NOTE — Progress Notes (Signed)
[facility] Progress Note Patient Name: [name] DOB: 1961-06-23 MRN: [id number]   Date of Service  08/06/2022  HPI/Events of Note  CXR and ABG reviewed. Complete opacification of right lung which is unchanged from baseline from prior day. ABG shows moderate respiratory acidosis.  eICU Interventions  Will defer to daytime PCCM attending regarding Rx of opacified right lung, patient refused BIPAP.        Kerry Kass Goldie Dimmer 08/06/2022, 12:40 AM

## 2022-08-06 NOTE — Progress Notes (Signed)
Daily Progress Note   Patient Name: [name]       Date: 08/06/2022 DOB: 03-20-1961  Age: 61 y.o. MRN#: [id number] Attending Physician: Brand Males, MD Primary Care Physician: Heath Lark, MD Admit Date: 08/02/2022 Length of Stay: 2 days  Reason for Consultation/Follow-up: Establishing goals of care  Subjective:   CC: Patient noting pain on right side.  Following up regarding complex medical decision making.  Subjective: Review of EMR prior to seeing patient this afternoon.  Patient emergently required thoracentesis today when patient decompensated with respiratory distress and A-fib/bradycardia.  Discussed care with bedside RN prior to seeing patient.  Presented to bedside and reintroduced self to patient.  Patient noted that she is breathing better at this time after thoracentesis was performed today.  She did note pain on her right side.  Patient has been receiving IV Dilaudid 0.5 mg for pain management though she feels this marginally knocks down her pain.  We discussed importance of controlling pain to a functional level although expectations of 0 pain is difficult to obtain and instead should focus on functional status as pain measure.  Again patient is unable to accurately describe how she was taking her medications at home so unsure how much Dilaudid she was truly taking though she was prescribed Dilaudid p.o. 8 mg tablets. Appears there have been discussions with patient's family and oncologist today.  Dr. Alvy Bimler planning to speak with patient and family on 08/07/2022 as she has an established relationship with patient and family.  Encouraged family meeting.  All questions answered at that time.  Noted palliative care team would continue to follow along.  Review of Systems Noting pain on right side that is marginally improved with IV Dilaudid. Objective:   Vital Signs:  BP 113/66   Pulse 85   Temp 98.5 F (36.9 C) (Axillary)   Resp (!) 24   Ht '5\' 6"'$  (1.676 m)   Wt  53.8 kg   SpO2 100%   BMI 19.14 kg/m   Physical Exam: General: NAD, alert, laying in bed Eyes: conjunctiva clear, anicteric sclera HENT: moist mucous membranes Cardiovascular: RRR Respiratory: increased work of breathing noted Abdomen: not distended Extremities: no edema in LE b/l Skin: no rashes or lesions on visible skin Neuro: mentation waxes and wanes per discussions though patient appropriately answering questions during this visit  Assessment & Plan:   Assessment: Patient is a 61 year old woman with a past medical history of metastatic breast cancer and hypertension who was admitted on 08/02/2022 for management of decreased appetite, poor p.o. intake, and lethargy.  During hospitalization patient was found to have a current malignant right-sided pleural effusion which required thoracentesis.  Patient required transfer to the ICU on 11/7 due to becoming unresponsive; patient received Narcan and fluids for management of hypotension and unresponsiveness.  Palliative care consulted to assist with complex medical decision making and symptom management.   Recommendations/Plan: # Complex medical decision making/goals of care:  - Patient has been able to state known metastatic breast cancer.  According to chart review and oncology notes, patient's cancer has been responding to oral therapy.  Unfortunately patient continues to have failure to thrive in setting of this cancer.  Patient's failure to thrive appears to be multifactorial.   -Agree with family meeting including patient, patient's mother, patient's sister, oncology, and pulmonology.  Please reach out to inform palliative care provider of meeting time if would like palliative care team to join as well.  Really appreciate oncologist's input as has established  relationship with patient and family.  -  Code Status: Full Code  # Symptom management:  -Pain, acute on chronic Patient had been receiving Dilaudid 8 mg p.o. since July 2023 as  per PDMP review.                                 -Would recommend consideration of scheduling Tylenol 1000 mg 3 times daily to help with pain management.                               -If needed could also consider other pain interventions such as lidocaine patch or a neuropathic agent.   -Patient receiving IV Dilaudid for acute pain management in controlled setting.  Based on patient's outpatient p.o. Dilaudid prescription equivalents to IV Dilaudid would be approximately 3.2.  Would consider increasing patient's IV Dilaudid dose to a lower range of 0.5 mg - 1 mg every 4 hours as needed.      -Constipation, acute on chronic                  -Receiving senna 2 tabs twice daily.  Patient to receive up to 8 tabs in a 24-hour period.  # Psychosocial Support:  -Needs family meeting  -Recommend completion of ACP documentation with patient and family present if patient able to discuss.   # Discharge Planning: TBD   Thank you for allowing the palliative care team to participate in the care Macky Lower.  Chelsea Aus, DO Palliative Care Provider PMT # [phone]  If patient remains symptomatic despite maximum doses, please call PMT at [phone] between 0700 and 1900. Outside of these hours, please call attending, as PMT does not have night coverage.  This provider spent a total of 35 minutes providing patient's care.  Includes review of EMR, discussing care with other staff members involved in patient's medical care, obtaining relevant history and information from patient and/or patient's family, and personal review of imaging and lab work. Greater than 50% of the time was spent counseling and coordinating care related to the above assessment and plan.

## 2022-08-06 NOTE — Progress Notes (Signed)
PT Cancellation Note  Patient Details Name: [name] MRN: [id number] DOB: [birth year]   Cancelled Treatment:    Reason Eval/Treat Not Completed: Medical issues which prohibited therapy On Neo, fem art line in place, no PT today per RN.  [place] Office [phone] Weekend [phone]   Claretha Cooper 08/06/2022, 8:59 AM

## 2022-08-06 NOTE — Telephone Encounter (Signed)
Pt's mother, [name] called and states in her voicemail the pulmonologist the patient has seen inpatient continues to say the pt has infection in her lungs and wants to place a chest tube. Wendy [name] states this is very upsetting for [name] and she wants this particular pulmonologist to leave her alone. Wendy [name] requests that her "normal pulmonologist" come in to the hospital to give his medical opinion. Wendy [name] states in her voicemail, "He better stop and leave her alone before he gets reported to the medical board. If he wants someone to practice on, he can practice on a cadaver." Attempted to return [name]'s call and LVM for call back.

## 2022-08-06 NOTE — Progress Notes (Signed)
Patient attempting to climb out of the bed, patient appeared to be in respiratory distress, heart rate dropped to 30s then increased to 150s and sustained. Patient states to nurse "I feel like I'm dying," MD notified and to bedside. Patient required immediate intervention by MD and after procedure stated she felt so much better and was resting comfortably. Vital signs now stable.

## 2022-08-07 ENCOUNTER — Inpatient Hospital Stay (HOSPITAL_COMMUNITY): Payer: BC Managed Care – PPO

## 2022-08-07 DIAGNOSIS — J90 Pleural effusion, not elsewhere classified: Secondary | ICD-10-CM | POA: Diagnosis not present

## 2022-08-07 LAB — CBC
HCT: 25.7 % — ABNORMAL LOW (ref 36.0–46.0)
Hemoglobin: 8 g/dL — ABNORMAL LOW (ref 12.0–15.0)
MCH: 26.3 pg (ref 26.0–34.0)
MCHC: 31.1 g/dL (ref 30.0–36.0)
MCV: 84.5 fL (ref 80.0–100.0)
Platelets: 528 10*3/uL — ABNORMAL HIGH (ref 150–400)
RBC: 3.04 MIL/uL — ABNORMAL LOW (ref 3.87–5.11)
RDW: 17 % — ABNORMAL HIGH (ref 11.5–15.5)
WBC: 12.6 10*3/uL — ABNORMAL HIGH (ref 4.0–10.5)
nRBC: 0 % (ref 0.0–0.2)

## 2022-08-07 LAB — COMPREHENSIVE METABOLIC PANEL
ALT: 20 U/L (ref 0–44)
AST: 20 U/L (ref 15–41)
Albumin: 2.2 g/dL — ABNORMAL LOW (ref 3.5–5.0)
Alkaline Phosphatase: 49 U/L (ref 38–126)
Anion gap: 4 — ABNORMAL LOW (ref 5–15)
BUN: 30 mg/dL — ABNORMAL HIGH (ref 8–23)
CO2: 28 mmol/L (ref 22–32)
Calcium: 7.7 mg/dL — ABNORMAL LOW (ref 8.9–10.3)
Chloride: 105 mmol/L (ref 98–111)
Creatinine, Ser: 1.08 mg/dL — ABNORMAL HIGH (ref 0.44–1.00)
GFR, Estimated: 58 mL/min — ABNORMAL LOW (ref >60)
Glucose, Bld: 135 mg/dL — ABNORMAL HIGH (ref 70–99)
Potassium: 4.2 mmol/L (ref 3.5–5.1)
Sodium: 137 mmol/L (ref 135–145)
Total Bilirubin: 0.3 mg/dL (ref 0.3–1.2)
Total Protein: 6 g/dL — ABNORMAL LOW (ref 6.5–8.1)

## 2022-08-07 LAB — PROCALCITONIN: Procalcitonin: 0.41 ng/mL

## 2022-08-07 LAB — LACTIC ACID, PLASMA: Lactic Acid, Venous: 1.1 mmol/L (ref 0.5–1.9)

## 2022-08-07 MED ORDER — ENSURE ENLIVE PO LIQD
237.0000 mL | Freq: Two times a day (BID) | ORAL | Status: DC
  Administered 2022-08-09 – 2022-08-10 (×3): 237 mL via ORAL

## 2022-08-07 NOTE — Progress Notes (Signed)
Brief PCCM Progress Note  Discussed recommendation for pigtail chest tube placement for septic shock due to empyema with pt's mother and sister Neoma Laming. At their request I reached out as well to Dr. Vaughan Browner to ensure there was consensus for this recommendation. Explained that although she was stably critically ill she could rapidly deteriorate from her staph empyema and could require urgent/emergent placement of chest tube. They understand this risk and they request to wait to have IR place tomorrow.  Additional critical care time 45 minutes

## 2022-08-07 NOTE — TOC Progression Note (Signed)
Transition of Care [facility]) - Progression Note    Patient Details  Name: [name] MRN: [id number] Date of Birth: 1961/06/03  [facility] CM/SW Contact  Tynlee Bayle, Juliann Pulse, RN Phone Number: 08/07/2022, 2:06 PM  Clinical Narrative: Noted ongoing discussions about treatment-oncology following,palliative care team.      Expected Discharge Plan: [place] Barriers to Discharge: Continued Medical Work up  Expected Discharge Plan and Services Expected Discharge Plan: [place]   Discharge Planning Services: CM Consult   Living arrangements for the past 2 months: Apartment                                       Social Determinants of Health (SDOH) Interventions    Readmission Risk Interventions    08/06/2022   10:01 AM  Readmission Risk Prevention Plan  Medication Review (RN Care Manager) Complete  PCP or Specialist appointment within 3-5 days of discharge Complete  HRI or Home Care Consult Complete  SW Recovery Care/Counseling Consult Complete  Palliative Care Screening Not [place] Complete

## 2022-08-07 NOTE — Progress Notes (Signed)
I spent at least another 15 minutes reviewing plan of care with the patient's sister and mother She reiterated some of her observations regarding the patient's inability to eat food due to inability to get access to food as well as her concern about [place] remaining on Dilaudid due to recent episode of confusion/overdose  I reviewed plan of care and discussed the role of placement of Pleurx catheter versus chest tube for empyema management.  I would defer to primary service to see if we can allow her to eat regular food and will continue on calorie count.  The patient has been on Dilaudid for many months and I think her recent confusion episode was caused by new episode of infection/empyema in addition to delirium being in the hospital.  At her current dose, I think it is safe for her to proceed  I have addressed all their questions and concerns

## 2022-08-07 NOTE — Progress Notes (Signed)
Calorie Count Note  48 hour calorie count ordered. *Per oncology note from this AM, provider would like to continue calorie count now that pt is on regular diet. Will provide next 24hrs of nutrition information tomorrow. RD unavailable for calorie count over the weekend.  Estimated Nutritional Needs:  Kcal:  1675-2015kcal (Mifflin x 1.5-1.8, REE: 1119) Protein:  80-110g (1.5-2.0g/kg)  Diet: CLD Supplements: Boost Breeze  Breakfast: chicken broth  Lunch: jello and [place] ice Dinner: jello and [place] ice Supplements: 2 Boost Breeze (unclear if 100% consumed)  Total intake: 850 kcal (51% of minimum estimated needs)  19g protein (24% of minimum estimated needs)  Nutrition Dx: severe protein calorie malnutrition r/t metastatic cancer AEB significant 17.4% unintended weight loss in 4 months, severe muscle wasting and severe fat loss.   Goal: Consume >90% estimated nutrient needs   Intervention: Diet advanced to regular. Supplement changed to Ensure Plus HP per pt food preferences (350kcal, 20g protein). Provide bowel regimen (last BM 11/6).  Pt now off norepinephrine and vasopressin. Remains on phenylephrine only. MAP maintaining >60.  Candise Bowens, MS, RD, LDN, CNSC See AMiON for contact information

## 2022-08-07 NOTE — Progress Notes (Signed)
[name]   DOB:1961/01/12   MG#:[id number]    ASSESSMENT & PLAN:  Metastatic breast cancer to the lung and bones While she responded very well to chemotherapy and antiestrogen therapy, she is not thriving due to progressive weight loss and weakness While hospitalized, I recommend we continue on antiestrogen therapy but hold abemaciclib I have discussed the plan of care with family Unfortunately, she is now found to have MSSA empyema Overall, she is on the trajectory of general decline Continue best supportive care for now but I did warn the patient that she may not make it out of the hospital if she does not improve with aggressive intervention   Altered mental status, resolved Multifactorial, could be due to narcotic prescription She denies pain right now   Recurrent malignant pleural effusion with MSSA empyema She has persistent pleural effusion despite thoracentesis I have a long discussion with critical care team yesterday I reviewed with the patient the rationale of placement of drainage catheter and potentially chest tube placement Continue antibiotics therapy The patient is not able to make decision about this today but I will continue conversation with her and her family   Malignant cachexia Failure to thrive Severe protein calorie malnutrition This is multifactorial, a component of noncompliance and poor access to food Recommend dietitian review and additional nutritional supplement as tolerated Recommend advancing her diet to general diet as tolerated   Generalized weakness Failure to thrive Multifactorial, related to progressive weight loss, muscle loss and deconditioning Recommend skilled nursing facility placement upon discharge   Code Status With a progressive decline in empyema, I reviewed the CODE STATUS with the patient twice this morning Ultimately, she agreed with changing her CODE STATUS to DO NOT RESUSCITATE   Goals of care Improvement of weakness,  infection and nutrition status   Discharge planning Unknown at this point It is possible that the patient will continue on the trajectory of decline and may not make it out of the hospital However, if she responds well to treatment, she can benefit from skilled nursing facility placement  I attempted to call her mother and sister this morning, unable to get hold of either one of them We will try again later today  All questions were answered. The patient knows to call the clinic with any problems, questions or concerns.   The total time spent in the appointment was 60 minutes encounter with patients including review of chart and various tests results, discussions about plan of care and coordination of care plan  Wendy Lark, MD 08/07/2022 7:38 AM  Subjective:  Her x-ray findings are reviewed.  I have reviewed her case with pulmonologist/critical care team yesterday.  This morning, she felt fine.  Denies significant cough, pain or shortness of breath   Objective:  Vitals:   08/07/22 0615 08/07/22 0630  BP: (!) 91/43 105/61  Pulse: 76 77  Resp: 15 (!) 28  Temp:    SpO2: 100% 98%     Intake/Output Summary (Last 24 hours) at 08/07/2022 0738 Last data filed at 08/07/2022 0715 Gross per 24 hour  Intake 2041.55 ml  Output 1100 ml  Net 941.55 ml    GENERAL:alert, no distress and comfortable.  She looks cachectic LUNGS: Shallow breath sounds.  Clear to auscultation on anterior auscultation  HEART: regular rate & rhythm and no murmurs and no lower extremity edema ABDOMEN:abdomen soft, non-tender and normal bowel sounds Musculoskeletal:no cyanosis of digits and no clubbing  NEURO: alert & oriented x 3 with fluent speech,  no focal motor/sensory deficits   Labs:  Recent Labs    08/05/22 0458 08/06/22 0456 08/06/22 1549 08/07/22 0428  NA 136 136  --  137  K 4.0 4.2  --  4.2  CL 103 102  --  105  CO2 25 28  --  28  GLUCOSE 129* 124*  --  135*  BUN 33* 35*  --  30*  CREATININE  1.85* 1.38*  --  1.08*  CALCIUM 7.1* 8.0*  --  7.7*  GFRNONAA 30* 43*  --  58*  PROT 6.3* 6.0* 6.1* 6.0*  ALBUMIN 2.4* 2.3*  --  2.2*  AST 22 18  --  20  ALT 23 21  --  20  ALKPHOS 50 49  --  49  BILITOT 0.3 0.4  --  0.3    Studies: I have reviewed her chest x-ray DG Chest Port 1 View  Result Date: 08/06/2022 CLINICAL DATA:  S/P thoracentesis EXAM: PORTABLE CHEST 1 VIEW COMPARISON:  Same day chest x-ray. FINDINGS: Decreased but still large right pleural effusion. Small left pleural effusion. Bilateral overlying opacities. No visible pneumothorax on this semi erect radiograph. Cardiomediastinal silhouette is similar. Polyarticular degenerative change. IMPRESSION: 1. Decreased but still large right pleural effusion. No visible pneumothorax on this semi erect radiograph. 2. Small left pleural effusion 3. Bilateral overlying opacities. Electronically Signed   By: Margaretha Sheffield M.D.   On: 08/06/2022 15:50   DG Chest 1 View  Result Date: 08/06/2022 CLINICAL DATA:  Respiratory failure. EXAM: CHEST  1 VIEW COMPARISON:  Multiple recent chest x-rays. FINDINGS: Persistent complete opacification of the right hemithorax due to a large pleural effusion. Slight shift of the heart and mediastinum to the left exaggerated by the rotation of the patient and thoracic scoliosis. Persistent left lower lobe atelectasis but no left pleural effusion. IMPRESSION: Persistent complete opacification of the right hemithorax. Electronically Signed   By: Marijo Sanes M.D.   On: 08/06/2022 11:48   DG Chest Port 1 View  Result Date: 08/05/2022 CLINICAL DATA:  Shortness of breath EXAM: PORTABLE CHEST 1 VIEW COMPARISON:  08/05/2022, 06/25/2022, 08/03/2022 FINDINGS: Complete opacification of right thorax without change. Subsegmental atelectasis left base. Partially obscured cardiomediastinal silhouette. IMPRESSION: Complete opacification of the right thorax without change, probably due to sizable pleural effusion.  Subsegmental atelectasis left base Electronically Signed   By: Donavan Foil M.D.   On: 08/05/2022 23:33   ECHOCARDIOGRAM COMPLETE  Result Date: 08/05/2022    ECHOCARDIOGRAM REPORT   Patient Name:   [name] Date of Exam: 08/05/2022 Medical Rec #:  [id number]       Height:       66.0 in Accession #:    [id number]      Weight:       118.6 lb Date of Birth:  12-06-1960        BSA:          1.602 m Patient Age:    61 years        BP:           97/44 mmHg Patient Gender: F               HR:           78 bpm. Exam Location:  Inpatient Procedure: 2D Echo, Cardiac Doppler and Color Doppler Indications:    R94.31 Abnormal EKG  History:        Patient has no prior history of Echocardiogram examinations.  Risk Factors:Hypertension. Metastatic breast cancer.  Sonographer:    [place] Referring Phys: [id number] AMRIT ADHIKARI  Sonographer Comments: Technically difficult study due to poor echo windows, suboptimal apical window and suboptimal subcostal window. Apical images off axis due to the hardened texture of the left breast. Could not turn patient. Wound dressings in subcostal region. IMPRESSIONS  1. Left ventricular ejection fraction, by estimation, is 55 to 60%. The left ventricle has normal function. The left ventricle has no regional wall motion abnormalities. Left ventricular diastolic parameters were normal.  2. Right ventricular systolic function is normal. The right ventricular size is normal. Mildly increased right ventricular wall thickness. There is mildly elevated pulmonary artery systolic pressure. The estimated right ventricular systolic pressure is 92.1 mmHg.  3. Left atrial size was mildly dilated.  4. A small pericardial effusion is present. The pericardial effusion is circumferential. There is no evidence of cardiac tamponade. Large pleural effusion in the right lateral region.  5. The mitral valve is myxomatous. Mild mitral valve regurgitation. 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 Tricuspid valve regurgitation is mild to moderate.  7. The aortic valve is tricuspid. Aortic valve regurgitation is not visualized. No aortic stenosis is present.  8. The inferior vena cava is normal in size with greater than 50% respiratory variability, suggesting right atrial pressure of 3 mmHg. FINDINGS  Left Ventricle: Left ventricular ejection fraction, by estimation, is 55 to 60%. The left ventricle has normal function. The left ventricle has no regional wall motion abnormalities. The left ventricular internal cavity size was normal in size. There is  no left ventricular hypertrophy. Left ventricular diastolic parameters were normal. Right Ventricle: The right ventricular size is normal. Mildly increased right ventricular wall thickness. Right ventricular systolic function is normal. There is mildly elevated pulmonary artery systolic pressure. The tricuspid regurgitant velocity is 3.19 m/s, and with an assumed right atrial pressure of 3 mmHg, the estimated right ventricular systolic pressure is 19.4 mmHg. Left Atrium: Left atrial size was mildly dilated. Right Atrium: Right atrial size was normal in size. Pericardium: Complex septated fluid collection in the right pleural cavity, consider empyema or malignant effusion. A small pericardial effusion is present. The pericardial effusion is circumferential. There is no evidence of cardiac tamponade. 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 There is mild thickening of the mitral valve leaflet(s). Mild mitral valve regurgitation, with centrally-directed jet. Tricuspid Valve: The tricuspid valve is myxomatous. Tricuspid valve regurgitation is mild to moderate. Aortic Valve: The aortic valve is tricuspid. Aortic valve regurgitation is not visualized. No aortic stenosis is present. Pulmonic Valve: The pulmonic  valve was normal in structure. Pulmonic valve regurgitation is mild. Aorta: The aortic root and ascending aorta are structurally normal, with no evidence of dilitation. Venous: IVC assessment for right atrial pressure unable to be performed due to mechanical ventilation. The inferior vena cava is normal in size with greater than 50% respiratory variability, suggesting right atrial pressure of 3 mmHg. IAS/Shunts: No atrial level shunt detected by color flow Doppler. Additional Comments: There is a large pleural effusion in the right lateral region.  LEFT VENTRICLE PLAX 2D LVIDd:         4.43 cm     Diastology LVIDs:         2.80 cm     LV e' medial:  5.08 cm/s LV PW:         1.03 cm     LV E/e' medial:  10.3 LV IVS:        0.87 cm     LV e' lateral:   6.46 cm/s LVOT diam:     2.10 cm     LV E/e' lateral: 8.1 LV SV:         44 LV SV Index:   28 LVOT Area:     3.46 cm  LV Volumes (MOD) LV vol d, MOD A4C: 57.5 ml LV vol s, MOD A4C: 18.7 ml LV SV MOD A4C:     57.5 ml RIGHT VENTRICLE             IVC RV S prime:     13.10 cm/s  IVC diam: 1.10 cm TAPSE (M-mode): 1.7 cm LEFT ATRIUM           Index        RIGHT ATRIUM           Index LA diam:      3.20 cm 2.00 cm/m   RA Area:     14.20 cm LA Vol (A4C): 57.5 ml 35.90 ml/m  RA Volume:   35.90 ml  22.42 ml/m  AORTIC VALVE             PULMONIC VALVE LVOT Vmax:   75.15 cm/s  PR End Diast Vel: 2.14 msec LVOT Vmean:  45.850 cm/s LVOT VTI:    0.127 m  AORTA Ao Root diam: 2.60 cm Ao Asc diam:  2.70 cm MITRAL VALVE               TRICUSPID VALVE MV Area (PHT): 4.89 cm    TR Peak grad:   40.7 mmHg MV Decel Time: 155 msec    TR Vmax:        319.00 cm/s MV E velocity: 52.40 cm/s MV A velocity: 50.00 cm/s  SHUNTS MV E/A ratio:  1.05        Systemic VTI:  0.13 m                            Systemic Diam: 2.10 cm Dani Gobble Croitoru MD Electronically signed by Sanda Klein MD Signature Date/Time: 08/05/2022/12:38:14 PM    Final    DG CHEST PORT 1 VIEW  Result Date: 08/05/2022 CLINICAL  DATA:  Right pleural effusion. EXAM: PORTABLE CHEST 1 VIEW COMPARISON:  08/05/2022 FINDINGS: Stable cardiomediastinal contours. Left lung is clear. Complete opacification of the right hemithorax is again noted and appears unchanged. Findings are favored to represent known large right pleural effusion. IMPRESSION: 1. No change in complete opacification of the right hemithorax which is presumed to represent large right pleural effusion. Electronically Signed   By: Kerby Moors M.D.   On: 08/05/2022 10:06   DG CHEST PORT 1 VIEW  Result Date: 08/05/2022 CLINICAL DATA:  Respiratory compromise EXAM: PORTABLE CHEST 1 VIEW COMPARISON:  08/03/2022 FINDINGS: Complete opacification of the right hemithorax, presumably reflecting a large pleural effusion. Left lung is clear. No pneumothorax. The heart is normal in size. IMPRESSION: Complete opacification of the right hemithorax, presumably reflecting a large pleural effusion. Electronically Signed   By: Julian Hy M.D.   On: 08/05/2022 00:41   US THORACENTESIS ASP PLEURAL SPACE W/IMG GUIDE  Result Date: 08/03/2022 INDICATION: Patient with history of right-sided breast cancer, right pleural effusion. Request is made for diagnostic and therapeutic  thoracentesis. EXAM: ULTRASOUND GUIDED RIGHT THORACENTESIS MEDICATIONS: 10 mL 1% lidocaine COMPLICATIONS: None immediate. PROCEDURE: An ultrasound guided thoracentesis was thoroughly discussed with the patient and questions answered. The benefits, risks, alternatives and complications were also discussed. The patient understands and wishes to proceed with the procedure. Written consent was obtained. Ultrasound was performed to localize and mark an adequate pocket of fluid in the right chest. The area was then prepped and draped in the normal sterile fashion. 1% Lidocaine was used for local anesthesia. Under ultrasound guidance a 6 Fr Safe-T-Centesis catheter was introduced. Thoracentesis was performed. The catheter was  removed and a dressing applied. FINDINGS: A total of approximately 800 mL of yellow fluid was removed. Samples were sent to the laboratory as requested by the clinical team. IMPRESSION: Successful ultrasound guided right thoracentesis yielding 800 mL of pleural fluid. Read by: Brynda Greathouse PA-C Electronically Signed   By: Sandi Mariscal M.D.   On: 08/03/2022 13:06   DG Chest Port 1 View  Result Date: 08/03/2022 CLINICAL DATA:  Post right thoracentesis EXAM: PORTABLE CHEST 1 VIEW COMPARISON:  08/02/2022 FINDINGS: Continued complete opacification of the right hemithorax. No visible pneumothorax. No confluent opacity or effusion on the left. Heart is normal size. IMPRESSION: Continued complete opacification of the right hemithorax. No visible pneumothorax. Electronically Signed   By: Rolm Baptise M.D.   On: 08/03/2022 11:52   DG Chest Portable 1 View  Result Date: 08/02/2022 CLINICAL DATA:  Weakness and breast cancer.  Dehydration.  Fatigue. EXAM: PORTABLE CHEST 1 VIEW COMPARISON:  Chest CT 06/25/2022 FINDINGS: Increased right pleural effusion with complete opacification of the right hemithorax. There is leftward shift of the trachea and mediastinal structures. No aerated right lung is seen. No focal airspace disease in the left lung. Left lung nodules on prior CT are not seen by radiograph. No visible pneumothorax. Osseous metastatic disease on CT not well demonstrated. IMPRESSION: Increased right pleural effusion with complete opacification of the right hemithorax. No aerated right lung is seen. Leftward shift of the trachea and mediastinal structures. Electronically Signed   By: Keith Rake M.D.   On: 08/02/2022 16:53

## 2022-08-07 NOTE — Telephone Encounter (Signed)
I will call her today

## 2022-08-07 NOTE — Progress Notes (Signed)
NAME:  [name], MRN:  [id number], DOB:  05-13-1961, LOS: 3 ADMISSION DATE:  08/02/2022, CONSULTATION DATE:  08/05/22 REFERRING MD:  Gershon Cull , CHIEF COMPLAINT:   Lethargy, ams   BRIEF  [name] is a 61 yo woman with a hx of metastatic breast cancer, HTN, admitted 11/4 with decreased appetite, poor po intake , weakness, lethargy; transferred to ICU tonight due to hypotension and lethargy.  On oral therapy for cancer:  +SOB, weakness  11/5 HR 130-140  Afib.  Given 2.5 mg iV metop when back to sinus. 500cc LR bolus.   11/5 IR thora 800cc yellow fluid removed  Stopped due to pain Afib again 11/6 Developed afib with RVR 11/6 given bolus and lopressor again 845.    Unresponsive tonight, responded to narcan.    Hospital meds: lovenox ppx, pepcid , megace, metop at 1318 pm 2009 pm today 0035 this am  Narcan 0.4 mg at 2219, 0026 Dilaudid '8mg'$  po 1053, 1701  Micro pleural effusion:  ABUNDANT GRAM POSITIVE COCCI IN CLUSTERS  RARE WBC PRESENT, PREDOMINANTLY PMN  IN BOTH AEROBIC AND ANAEROBIC BOTTLES   7/242/64.7/164, UA neg on admission  CXR : white out of R lung Pertinent  Medical History  HTN Breast cancer - R chest wall, contralateral breast (L. Mets to lung and bones.  Responding well to chemo/antiestrogen.   Failutre to thrive, weight loss, malignant cachexia  Malignant pleural effusion R   Home meds: abemaciclib, arimidex, calcium carbonate, vit D, Dilaudid '8mg'$  q6 prn  Mag oxide, mirtazapine 7.'5mg'$  qhs  Miralax, senna   Flagyl gel      Significant Hospital Events: Including procedures, antibiotic start and stop dates in addition to other pertinent events   03/23/2022: Right-sided thoracentesis: LDH 259.  White cells 207.  8% polymorphs. Malignant cells consistent with breast cancer present [id number] 08/02/2022 - admit MRSA PCR neg 08/03/2022: Right thoracentesis 800 mL and stopped due to pain LDH 439, white cell 7100/84% polymorphs > huge increase in  white count and left shift. MSSA gorwing from pleural fluids 08/03/22 08/05/22 - ccm consult and ICU tx Left groin CVL Left groin a-line  08/05/22 -last Narcan was at 4 AM.  Now completely awake on BiPAP.  Wants BiPAP off.  No respiratory distress.  Remains on vasopressors Levophed 9 mcg and vasopressin.  Diastolic is low at 40 with map of 71.  She has left femoral arterial line and central line.  She is oriented and communicating.  She remains afebrile.  White count is high.  She is on antibiotics vancomycin and cefepime. ECHO - ef65%. RSVP 45+., Mild to mod TR. Mild MR Family not in favor for pleurx. Dr Alvy Bimler recommending 1 more thora 08/06/22 worsening effusion with ?tension physiology s/p emergent thora  Interim History / Subjective:   Emergent thora yesterday with 1100cc dark amber, cloudy fluid.   Still on low dose neo.   Saturation 100% on 4L.   Objective   Blood pressure 105/61, pulse 77, temperature (!) 97.2 F (36.2 C), temperature source Oral, resp. rate (!) 28, height '5\' 6"'$  (1.676 m), weight 57 kg, SpO2 98 %.        Intake/Output Summary (Last 24 hours) at 08/07/2022 0728 Last data filed at 08/07/2022 0715 Gross per 24 hour  Intake 2041.55 ml  Output 1100 ml  Net 941.55 ml   Filed Weights   08/04/22 2315 08/05/22 0500 08/07/22 0500  Weight: 53.8 kg 53.8 kg 57 kg    General appearance: 61 y.o.,  female, NAD, frail, slumped to right side,  female, NAD, frail, slumped to right side Eyes: PERRL, tracking appropriately HENT: NCAT; MMM Lungs: Diminished on right, with normal respiratory effort CV: RRR, no murmur  Abdomen: Soft, non-tender; non-distended, BS present  Extremities: No peripheral edema, warm Skin: Normal turgor and texture; no rash Neuro: grossly nonfocal   Labs/Imaging CXR large residual R effusion  WBC 12  Resolved Hospital Problem list    Acute obtunded encephalopathy 08/04/2022 requiring BiPAP and Narcan pushes   Assessment and Plan:  Recurrent malignant right-sided pleural effusion  with MSSA empyema Acute respiratory acidosis hypercapnic respiratory failure 08/04/2022 resulting in ICU transfer and BiPAP S/p thora 11/5, 11/8. Family/pt have declined pigtail catheter placement. Given actively infected pleural space agree w holding off on pleurx.  - Wean oxygen for pulse ox greater than 92% [has dark skin] - ABX as below - further GoC discussion regarding pigtail catheter placement. Hard to see her surviving this without definitive drainage. A choice not to pursue this to me would mean a choice for comfort measures only. She wishes to think about it more and I told her I'd call her family today.  Chronic cancer related pain -on opioids at home Chronic depression on Remeron at home - resolved 08/05/22 - Monitor - Opioids for pain but reduce dosing (has not needed much in 24h) - RASS goal 0 to -2  Septic shock MSSA empyema R pleural space Hypovolemic shock with third spacing - Neo for MAP 65 - Continue midodrine since 08/05/22 - Continue hydrocortisone since 08/05/22 and taper off once off of neo - Ancef 11/8- as above - have recommended pigtail catheter placement  Transient atrial fibrillation at admission 08/03/2022 -troponin normal 08/05/2022: EKG NSR.  Troponin normal, . eCHO unremarkabbl - Tele - EKG as neee  AKI, resolving - Maintain hemodynamics - Avoid nephrotoxins - PureWick  - trend bmet  Hypocalcemia - correct  Severe cachexia and PCM - baseline alb 2.4 at admit Low appeitie - on Megace - Cotninue megace - RC doncult  Chronic anemia - last transfusion likely June 2023 (baseline 9-10gm%) P:  - transfuse for Hb 7   AT risk for hypo and hyperglycemia P:   SSI  Oncology Advanced breast cancer - On Verzenio and Arimidex with malignant pleural effusion- Prior to & Present on Admit  - per onc  MSK/DERM Profound physical deconditioning Performed debility Profound cachexia Profound failure to thrivae - on megace Vit D def - on  replacement Severe protein calorie malnutrition    Best Practice (right click and "Reselect all SmartList Selections" daily)  Diet/type: Regular consistency (see orders)  DVT prophylaxis: other GI prophylaxis: PEPCID oral Lines: Central line 11/7- Foley:  PUREWICK Code Status:  DNR  Last date of multidisciplinary goals of care discussion  - pall care called - goal of care needed. Dr Alvy Bimler involved in family discussions and aprpeciated  - 11/7/23P Consider pleurx -Called sister Brynne Doane [phone] 8:41am and updated - declined PleurX - 08/06/22 - Called sister Louretta Tantillo:   - explained empyema main issue and antibiotics necessary but not sufficient. Chest tube indicated but sister/mother prefer attempt at thora x 1 and not tube.  D/w patient - she does not want tube. So, continue antibiotics. Took shared decision making to hold off chest tube.    - Sister and mom were wondering about a thora ordered in office in summer 2023 and caused pain. They feel that contributed to distress.  Explained tumor burden changes over time  ATTESTATION & SIGNATURE   The patient [name] is critically ill with shock and requires high complexity decision making for assessment and support, frequent evaluation and titration of therapies, application of advanced monitoring technologies and extensive interpretation of multiple databases.   Critical Care Time devoted to patient care services described in this note is  35  Minutes. This time reflects time of care of this signee Dr. Walker Shadow. This critical care time does not reflect procedure time, or teaching time or supervisory time of PA/NP/Med student/Med Resident etc but could involve care discussion time     Fredirick Maudlin Pulmonary/Critical Care   08/07/2022 7:28 AM    LABS

## 2022-08-08 ENCOUNTER — Inpatient Hospital Stay (HOSPITAL_COMMUNITY): Payer: BC Managed Care – PPO

## 2022-08-08 DIAGNOSIS — J90 Pleural effusion, not elsewhere classified: Secondary | ICD-10-CM | POA: Diagnosis not present

## 2022-08-08 LAB — RENAL FUNCTION PANEL
Albumin: 2.1 g/dL — ABNORMAL LOW (ref 3.5–5.0)
Anion gap: 5 (ref 5–15)
BUN: 25 mg/dL — ABNORMAL HIGH (ref 8–23)
CO2: 29 mmol/L (ref 22–32)
Calcium: 7.8 mg/dL — ABNORMAL LOW (ref 8.9–10.3)
Chloride: 104 mmol/L (ref 98–111)
Creatinine, Ser: 0.8 mg/dL (ref 0.44–1.00)
GFR, Estimated: >60 mL/min (ref >60)
Glucose, Bld: 135 mg/dL — ABNORMAL HIGH (ref 70–99)
Phosphorus: 1.5 mg/dL — ABNORMAL LOW (ref 2.5–4.6)
Potassium: 4.2 mmol/L (ref 3.5–5.1)
Sodium: 138 mmol/L (ref 135–145)

## 2022-08-08 LAB — CBC
HCT: 25.3 % — ABNORMAL LOW (ref 36.0–46.0)
Hemoglobin: 7.9 g/dL — ABNORMAL LOW (ref 12.0–15.0)
MCH: 26 pg (ref 26.0–34.0)
MCHC: 31.2 g/dL (ref 30.0–36.0)
MCV: 83.2 fL (ref 80.0–100.0)
Platelets: 532 10*3/uL — ABNORMAL HIGH (ref 150–400)
RBC: 3.04 MIL/uL — ABNORMAL LOW (ref 3.87–5.11)
RDW: 16.7 % — ABNORMAL HIGH (ref 11.5–15.5)
WBC: 13.8 10*3/uL — ABNORMAL HIGH (ref 4.0–10.5)
nRBC: 0 % (ref 0.0–0.2)

## 2022-08-08 LAB — PATHOLOGIST SMEAR REVIEW

## 2022-08-08 LAB — PROCALCITONIN: Procalcitonin: 0.18 ng/mL

## 2022-08-08 MED ORDER — FENTANYL CITRATE (PF) 100 MCG/2ML IJ SOLN
INTRAMUSCULAR | Status: DC | PRN
  Administered 2022-08-08: 50 ug via INTRAVENOUS

## 2022-08-08 MED ORDER — HYDROCORTISONE SOD SUC (PF) 100 MG IJ SOLR
50.0000 mg | Freq: Four times a day (QID) | INTRAMUSCULAR | Status: DC
  Administered 2022-08-08 – 2022-08-13 (×21): 50 mg via INTRAVENOUS
  Filled 2022-08-08 (×21): qty 2

## 2022-08-08 MED ORDER — LACTATED RINGERS IV BOLUS
500.0000 mL | Freq: Once | INTRAVENOUS | Status: AC
  Administered 2022-08-08: 500 mL via INTRAVENOUS

## 2022-08-08 MED ORDER — MIDAZOLAM HCL 2 MG/2ML IJ SOLN
INTRAMUSCULAR | Status: DC | PRN
  Administered 2022-08-08: 1 mg via INTRAVENOUS

## 2022-08-08 MED ORDER — FENTANYL CITRATE PF 50 MCG/ML IJ SOSY
50.0000 ug | PREFILLED_SYRINGE | Freq: Once | INTRAMUSCULAR | Status: AC
  Administered 2022-08-08: 50 ug via INTRAVENOUS

## 2022-08-08 MED ORDER — FENTANYL CITRATE PF 50 MCG/ML IJ SOSY
PREFILLED_SYRINGE | INTRAMUSCULAR | Status: AC
  Filled 2022-08-08: qty 1

## 2022-08-08 MED ORDER — NOREPINEPHRINE 4 MG/250ML-% IV SOLN
0.0000 ug/min | INTRAVENOUS | Status: DC
  Administered 2022-08-08: 2 ug/min via INTRAVENOUS
  Filled 2022-08-08: qty 250

## 2022-08-08 MED ORDER — FENTANYL CITRATE (PF) 100 MCG/2ML IJ SOLN
INTRAMUSCULAR | Status: AC
  Filled 2022-08-08: qty 2

## 2022-08-08 MED ORDER — MIDAZOLAM HCL 2 MG/2ML IJ SOLN
INTRAMUSCULAR | Status: AC
  Filled 2022-08-08: qty 4

## 2022-08-08 NOTE — Progress Notes (Addendum)
NAME:  [name], MRN:  [id number], DOB:  1961-04-10, LOS: 4 ADMISSION DATE:  08/02/2022, CONSULTATION DATE:  08/05/22 REFERRING MD:  Gershon Cull , CHIEF COMPLAINT:   Lethargy, ams   BRIEF  61 year old with metastatic breast cancer, hypertension with recurrent right-sided malignant effusion presenting with decreased appetite, weakness and lethargy.  She underwent thoracentesis on admission showing MSSA empyema.  She underwent emergent thoracentesis 11/.  Remains on pressors tenuous clinical status.  Pertinent  Medical History  HTN Breast cancer - R chest wall, contralateral breast (L. Mets to lung and bones.  Responding well to chemo/antiestrogen.   Failutre to thrive, weight loss, malignant cachexia  Malignant pleural effusion R   Home meds: abemaciclib, arimidex, calcium carbonate, vit D, Dilaudid '8mg'$  q6 prn  Mag oxide, mirtazapine 7.'5mg'$  qhs  Miralax, senna   Flagyl gel   Significant Hospital Events: Including procedures, antibiotic start and stop dates in addition to other pertinent events   03/23/2022: Right-sided thoracentesis: LDH 259.  White cells 207.  8% polymorphs. Malignant cells consistent with breast cancer present xxxxxxxxxxxxxxxxxx 08/02/2022 - admit MRSA PCR neg 08/03/2022: Right thoracentesis 800 mL and stopped due to pain LDH 439, white cell 7100/84% polymorphs > huge increase in white count and left shift. MSSA gorwing from pleural fluids 08/03/22 08/05/22 - ccm consult and ICU tx Left groin CVL Left groin a-line  08/05/22 -last Narcan was at 4 AM.  Now completely awake on BiPAP.  Wants BiPAP off.  No respiratory distress.  Remains on vasopressors Levophed 9 mcg and vasopressin.  Diastolic is low at 40 with map of 71.  She has left femoral arterial line and central line.  She is oriented and communicating.  She remains afebrile.  White count is high.  She is on antibiotics vancomycin and cefepime. ECHO - ef65%. RSVP 45+., Mild to mod TR. Mild MR Family not in  favor for pleurx. Dr Alvy Bimler recommending 1 more thora 08/06/22 worsening effusion with ?tension physiology s/p emergent thora  Interim History / Subjective:   Remains on Neo-Synephrine infusion  Objective   Blood pressure 113/62, pulse 68, temperature 98.3 F (36.8 C), temperature source Oral, resp. rate (!) 24, height '5\' 6"'$  (1.676 m), weight 57.4 kg, SpO2 100 %.        Intake/Output Summary (Last 24 hours) at 08/08/2022 0802 Last data filed at 08/08/2022 0615 Gross per 24 hour  Intake 1070.07 ml  Output 1100 ml  Net -29.93 ml   Filed Weights   08/05/22 0500 08/07/22 0500 08/08/22 0500  Weight: 53.8 kg 57 kg 57.4 kg    Blood pressure 113/62, pulse 68, temperature 98.3 F (36.8 C), temperature source Oral, resp. rate (!) 24, height '5\' 6"'$  (1.676 m), weight 57.4 kg, SpO2 100 %. Gen:      No acute distress, frail, malnourished, slumped over to the right.  Complaining of right chest pain HEENT:  EOMI, sclera anicteric Neck:     No masses; no thyromegaly Lungs:    Diminished breath sounds on the right CV:         Regular rate and rhythm; no murmurs Abd:      + bowel sounds; soft, non-tender; no palpable masses, no distension Ext:    No edema; adequate peripheral perfusion Skin:      Warm and dry; no rash Neuro: alert and oriented x 3  Labs/Imaging reviewed Significant for phosphorus 1.5, albumin 2.1 WBC 13.8, platelets 532, INR 1.3  Chest x-ray reviewed with persistent large right effusion with  almost complete whiteout of right lung.  Resolved Hospital Problem list   Acute obtunded encephalopathy secondary to sepsis, 08/04/2022 requiring BiPAP and Narcan pushes Continue monitoring   Malignant effusion on the right side MSSA edema Acute respiratory failure with hypoxia S/p thora 11/5, 11/8.  Continues to be growing some Staph aureus.  There is ongoing discussion with the family about chest tube placement I believe she will require at least a temporary chest tube for  drainage of infection if has any chance of her being able to come back with sepsis and recovery. We will discuss again with family today.  They have indicated in the past that they would like IR to do the procedure and not at the bedside.  Septic shock, MSSA empyema, present on admission Continue midodrine Change neo to levophed Continue hydrocortisone Continue Ancef  Transient Atrial flutter on admission Continue Telemetry Monitoring  Chronic cancer related pain -on opioids at home Chronic depression on Remeron at home - resolved 08/05/22 Montior. Reduced dose opiods  Severe cachexia, severe protein calorie malnutrition present on admission Dietitian on board.  Nutrition supplements Megace   Chronic anemia secondary to critical illness, malignancy Monitor CBC  Metastatic breast cancer Continue antiestrogen therapy.  Hold abemaciclib   Goals of care Made DNR yesterday after discussion with Dr. Alvy Bimler Palliative care is on board Prognosis is extremely guarded given significant comorbidities  Best Practice (right click and "Reselect all SmartList Selections" daily)  Diet/type: Regular consistency (see orders)  DVT prophylaxis: other GI prophylaxis: PEPCID oral Lines: Central line Femoral CVL, A line Foley:  PUREWICK Code Status:  DNR  Signature:   Marshell Garfinkel MD [facility] See Amion for pager  If no response to pager , please call [phone] until 7pm After 7:00 pm call Elink  [phone] 08/08/2022, 8:14 AM

## 2022-08-08 NOTE — Progress Notes (Signed)
Referring Physician(s): Mannam,P  Supervising Physician: Mir, Sharen Heck  Patient Status:  [name] - In-pt  Chief Complaint: Chest pain, dyspnea, breast cancer, recurrent malignant right pleural effusion   Subjective: Pt known to IR team from PICC placement on 03/24/22, and right thoracentesis on 7/13 and 08/03/22.  She has a past medical history of metastatic breast cancer with recurrent malignant right pleural effusion, hypertension, depression, anemia.  She was recently admitted with encephalopathy secondary to sepsis, respiratory failure, transient a flutter, chronic cancer related pain.  Culture from latest thoracentesis grew Staph aureus.  Request now received from CCM for right chest drain placement.  She currently denies fever, headache, abdominal pain, back pain, nausea, vomiting or bleeding.  She continues to have chest discomfort, dyspnea and occasional cough.  Latest chest x-ray shows large right pleural effusion with significantly diminished aeration to the right lung. She is a DNR.   Past Medical History:  Diagnosis Date   Hypertension    Past Surgical History:  Procedure Laterality Date   ANKLE SURGERY     IR THORACENTESIS ASP PLEURAL SPACE W/IMG GUIDE  04/10/2022   IR US GUIDE BX ASP/DRAIN  03/24/2022      Allergies: Patient has no known allergies.  Medications: Prior to Admission medications   Medication Sig Start Date End Date Taking? Authorizing Provider  abemaciclib (VERZENIO) 100 MG tablet Take 1 tablet (100 mg total) by mouth 2 (two) times daily. 04/08/22  Yes Gorsuch, Ni, MD  anastrozole (ARIMIDEX) 1 MG tablet Take 1 tablet (1 mg total) by mouth daily. 04/08/22  Yes Gorsuch, Ni, MD  calcium carbonate (TUMS - DOSED IN MG ELEMENTAL CALCIUM) 500 MG chewable tablet Chew 1 tablet by mouth 2 (two) times daily.   Yes [provider]  cholecalciferol (VITAMIN D3) 25 MCG (1000 UNIT) tablet Take 2,000 Units by mouth daily.   Yes [provider]  famotidine  (PEPCID) 20 MG tablet Take 1 tablet (20 mg total) by mouth 2 (two) times daily. 05/30/22  Yes Gorsuch, Ni, MD  HYDROmorphone (DILAUDID) 8 MG tablet Take 1 tablet (8 mg total) by mouth every 6 (six) hours as needed for moderate pain. 07/31/22  Yes Gorsuch, Ni, MD  magnesium oxide (MAG-OX) 400 (240 Mg) MG tablet Take 1 tablet (400 mg total) by mouth daily. 06/12/22  Yes Gorsuch, Ni, MD  megestrol (MEGACE) 40 MG tablet Take 40 mg by mouth 2 (two) times daily.   Yes [provider]  mirtazapine (REMERON) 7.5 MG tablet Take 1 tablet (7.5 mg total) by mouth at bedtime. 05/30/22  Yes Gorsuch, Ni, MD  polyethylene glycol (MIRALAX / GLYCOLAX) 17 g packet Take 17 g by mouth daily.   Yes [provider]  senna (SENOKOT) 8.6 MG tablet Take 2 tablets by mouth 2 (two) times daily.   Yes [provider]  metroNIDAZOLE (METROGEL) 1 % gel Apply topically daily. Patient not taking: Reported on 08/02/2022 03/29/22   Larene Pickett, PA-C     Vital Signs: BP (!) 132/111   Pulse 70   Temp 98 F (36.7 C) (Oral)   Resp 19   Ht '5\' 6"'$  (1.676 m)   Wt 126 lb 8.7 oz (57.4 kg)   SpO2 95%   BMI 20.42 kg/m   Physical Exam patient awake, answering questions okay.  Chest with minimal breath sounds on right, slightly diminished breath sounds left base; heart with regular rate and rhythm.  Abdomen soft, positive bowel sounds, nontender.  No significant lower extremity  edema  Imaging: DG Chest Port 1 View  Result Date: 08/08/2022 CLINICAL DATA:  Follow-up pleural effusion. EXAM: PORTABLE CHEST 1 VIEW COMPARISON:  08/07/2022 FINDINGS: Exam detail is diminished due to patient positioning with significant rightward rotational artifact. The cardiomediastinal contours appear unchanged. There is a large right pleural effusion which is unchanged in volume compared with the previous exam. There is significantly diminished aeration to the right lung with only a small amount of aerated lung in the right upper  lobe. Small left pleural effusion is unchanged. IMPRESSION: 1. No change in large right pleural effusion with significantly diminished aeration to the right lung. Electronically Signed   By: Kerby Moors M.D.   On: 08/08/2022 07:05   DG Chest Port 1 View  Result Date: 08/07/2022 CLINICAL DATA:  Pleural effusion EXAM: PORTABLE CHEST 1 VIEW COMPARISON:  08/06/2022 FINDINGS: No significant change in rotated AP portable examination. Large right pleural effusion with near total atelectasis or consolidation of the right lung, as well as a small, layering left pleural effusion. Cardiomegaly, the cardiac borders largely obscured. IMPRESSION: No significant change in rotated AP portable examination. Large right pleural effusion with near total atelectasis or consolidation of the right lung, as well as a small, layering left pleural effusion. Electronically Signed   By: Delanna Ahmadi M.D.   On: 08/07/2022 09:17   DG Chest Port 1 View  Result Date: 08/06/2022 CLINICAL DATA:  S/P thoracentesis EXAM: PORTABLE CHEST 1 VIEW COMPARISON:  Same day chest x-ray. FINDINGS: Decreased but still large right pleural effusion. Small left pleural effusion. Bilateral overlying opacities. No visible pneumothorax on this semi erect radiograph. Cardiomediastinal silhouette is similar. Polyarticular degenerative change. IMPRESSION: 1. Decreased but still large right pleural effusion. No visible pneumothorax on this semi erect radiograph. 2. Small left pleural effusion 3. Bilateral overlying opacities. Electronically Signed   By: Margaretha Sheffield M.D.   On: 08/06/2022 15:50   DG Chest 1 View  Result Date: 08/06/2022 CLINICAL DATA:  Respiratory failure. EXAM: CHEST  1 VIEW COMPARISON:  Multiple recent chest x-rays. FINDINGS: Persistent complete opacification of the right hemithorax due to a large pleural effusion. Slight shift of the heart and mediastinum to the left exaggerated by the rotation of the patient and thoracic scoliosis.  Persistent left lower lobe atelectasis but no left pleural effusion. IMPRESSION: Persistent complete opacification of the right hemithorax. Electronically Signed   By: Marijo Sanes M.D.   On: 08/06/2022 11:48   DG Chest Port 1 View  Result Date: 08/05/2022 CLINICAL DATA:  Shortness of breath EXAM: PORTABLE CHEST 1 VIEW COMPARISON:  08/05/2022, 06/25/2022, 08/03/2022 FINDINGS: Complete opacification of right thorax without change. Subsegmental atelectasis left base. Partially obscured cardiomediastinal silhouette. IMPRESSION: Complete opacification of the right thorax without change, probably due to sizable pleural effusion. Subsegmental atelectasis left base Electronically Signed   By: Donavan Foil M.D.   On: 08/05/2022 23:33   ECHOCARDIOGRAM COMPLETE  Result Date: 08/05/2022    ECHOCARDIOGRAM REPORT   Patient Name:   [name] Date of Exam: 08/05/2022 Medical Rec #:  [id number]       Height:       66.0 in Accession #:    [id number]      Weight:       118.6 lb Date of Birth:  1961-07-06        BSA:          1.602 m Patient Age:    32 years        BP:  97/44 mmHg Patient Gender: F               HR:           78 bpm. Exam Location:  Inpatient Procedure: 2D Echo, Cardiac Doppler and Color Doppler Indications:    R94.31 Abnormal EKG  History:        Patient has no prior history of Echocardiogram examinations.                 Risk Factors:Hypertension. Metastatic breast cancer.  Sonographer:    [place] Referring Phys: [id number] AMRIT ADHIKARI  Sonographer Comments: Technically difficult study due to poor echo windows, suboptimal apical window and suboptimal subcostal window. Apical images off axis due to the hardened texture of the left breast. Could not turn patient. Wound dressings in subcostal region. IMPRESSIONS  1. Left ventricular ejection fraction, by estimation, is 55 to 60%. The left ventricle has normal function. The left ventricle has no regional wall motion abnormalities. Left  ventricular diastolic parameters were normal.  2. Right ventricular systolic function is normal. The right ventricular size is normal. Mildly increased right ventricular wall thickness. There is mildly elevated pulmonary artery systolic pressure. The estimated right ventricular systolic pressure is 79.3 mmHg.  3. Left atrial size was mildly dilated.  4. A small pericardial effusion is present. The pericardial effusion is circumferential. There is no evidence of cardiac tamponade. Large pleural effusion in the right lateral region.  5. 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 Tricuspid valve regurgitation is mild to moderate.  7. The aortic valve is tricuspid. Aortic valve regurgitation is not visualized. No aortic stenosis is present.  8. The inferior vena cava is normal in size with greater than 50% respiratory variability, suggesting right atrial pressure of 3 mmHg. FINDINGS  Left Ventricle: Left ventricular ejection fraction, by estimation, is 55 to 60%. The left ventricle has normal function. The left ventricle has no regional wall motion abnormalities. The left ventricular internal cavity size was normal in size. There is  no left ventricular hypertrophy. Left ventricular diastolic parameters were normal. Right Ventricle: The right ventricular size is normal. Mildly increased right ventricular wall thickness. Right ventricular systolic function is normal. There is mildly elevated pulmonary artery systolic pressure. The tricuspid regurgitant velocity is 3.19 m/s, and with an assumed right atrial pressure of 3 mmHg, the estimated right ventricular systolic pressure is 90.3 mmHg. Left Atrium: Left atrial size was mildly dilated. Right Atrium: Right atrial size was normal in size. Pericardium: Complex septated fluid collection in the right pleural cavity, consider empyema or  malignant effusion. A small pericardial effusion is present. The pericardial effusion is circumferential. There is no evidence of cardiac tamponade. 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 There is mild thickening of the mitral valve leaflet(s). Mild mitral valve regurgitation, with centrally-directed jet. Tricuspid Valve: The tricuspid valve is myxomatous. Tricuspid valve regurgitation is mild to moderate. Aortic Valve: The aortic valve is tricuspid. Aortic valve regurgitation is not visualized. No aortic stenosis is present. Pulmonic Valve: The pulmonic valve was normal in structure. Pulmonic valve regurgitation is mild. Aorta: The aortic root and ascending aorta are structurally normal, with no evidence of dilitation. Venous: IVC assessment for right atrial pressure unable to be performed due to mechanical ventilation. The inferior  vena cava is normal in size with greater than 50% respiratory variability, suggesting right atrial pressure of 3 mmHg. IAS/Shunts: No atrial level shunt detected by color flow Doppler. Additional Comments: There is a large pleural effusion in the right lateral region.  LEFT VENTRICLE PLAX 2D LVIDd:         4.43 cm     Diastology LVIDs:         2.80 cm     LV e' medial:    5.08 cm/s LV PW:         1.03 cm     LV E/e' medial:  10.3 LV IVS:        0.87 cm     LV e' lateral:   6.46 cm/s LVOT diam:     2.10 cm     LV E/e' lateral: 8.1 LV SV:         44 LV SV Index:   28 LVOT Area:     3.46 cm  LV Volumes (MOD) LV vol d, MOD A4C: 57.5 ml LV vol s, MOD A4C: 18.7 ml LV SV MOD A4C:     57.5 ml RIGHT VENTRICLE             IVC RV S prime:     13.10 cm/s  IVC diam: 1.10 cm TAPSE (M-mode): 1.7 cm LEFT ATRIUM           Index        RIGHT ATRIUM           Index LA diam:      3.20 cm 2.00 cm/m   RA Area:     14.20 cm LA Vol (A4C): 57.5 ml 35.90 ml/m  RA Volume:   35.90 ml  22.42 ml/m  AORTIC VALVE              PULMONIC VALVE LVOT Vmax:   75.15 cm/s  PR End Diast Vel: 2.14 msec LVOT Vmean:  45.850 cm/s LVOT VTI:    0.127 m  AORTA Ao Root diam: 2.60 cm Ao Asc diam:  2.70 cm MITRAL VALVE               TRICUSPID VALVE MV Area (PHT): 4.89 cm    TR Peak grad:   40.7 mmHg MV Decel Time: 155 msec    TR Vmax:        319.00 cm/s MV E velocity: 52.40 cm/s MV A velocity: 50.00 cm/s  SHUNTS MV E/A ratio:  1.05        Systemic VTI:  0.13 m                            Systemic Diam: 2.10 cm Dani Gobble Croitoru MD Electronically signed by Sanda Klein MD Signature Date/Time: 08/05/2022/12:38:14 PM    Final    DG CHEST PORT 1 VIEW  Result Date: 08/05/2022 CLINICAL DATA:  Right pleural effusion. EXAM: PORTABLE CHEST 1 VIEW COMPARISON:  08/05/2022 FINDINGS: Stable cardiomediastinal contours. Left lung is clear. Complete opacification of the right hemithorax is again noted and appears unchanged. Findings are favored to represent known large right pleural effusion. IMPRESSION: 1. No change in complete opacification of the right hemithorax which is presumed to represent large right pleural effusion. Electronically Signed   By: Kerby Moors M.D.   On: 08/05/2022 10:06   DG CHEST PORT 1 VIEW  Result Date: 08/05/2022 CLINICAL DATA:  Respiratory compromise EXAM: PORTABLE CHEST 1 VIEW COMPARISON:  08/03/2022 FINDINGS: Complete opacification of the right hemithorax, presumably reflecting a large pleural effusion. Left lung is clear. No pneumothorax. The heart is normal in size. IMPRESSION: Complete opacification of the right hemithorax, presumably reflecting a large pleural effusion. Electronically Signed   By: Julian Hy M.D.   On: 08/05/2022 00:41    Labs:  CBC: Recent Labs    08/05/22 0458 08/06/22 0456 08/07/22 0428 08/08/22 0500  WBC 11.8* 13.7* 12.6* 13.8*  HGB 9.0* 8.0* 8.0* 7.9*  HCT 29.8* 26.1* 25.7* 25.3*  PLT 586* 565* 528* 532*    COAGS: Recent Labs    08/05/22 0458 08/06/22 0456  INR 1.4* 1.3*     BMP: Recent Labs    08/05/22 0458 08/06/22 0456 08/07/22 0428 08/08/22 0500  NA 136 136 137 138  K 4.0 4.2 4.2 4.2  CL 103 102 105 104  CO2 '25 28 28 29  '$ GLUCOSE 129* 124* 135* 135*  BUN 33* 35* 30* 25*  CALCIUM 7.1* 8.0* 7.7* 7.8*  CREATININE 1.85* 1.38* 1.08* 0.80  GFRNONAA 30* 43* 58* >60    LIVER FUNCTION TESTS: Recent Labs    08/03/22 0705 08/05/22 0458 08/06/22 0456 08/06/22 1549 08/07/22 0428 08/08/22 0500  BILITOT 0.4 0.3 0.4  --  0.3  --   AST '27 22 18  '$ --  20  --   ALT '29 23 21  '$ --  20  --   ALKPHOS 53 50 49  --  49  --   PROT 6.7 6.3* 6.0* 6.1* 6.0*  --   ALBUMIN 2.6* 2.4* 2.3*  --  2.2* 2.1*    Assessment and Plan: Pt known to IR team from PICC placement on 03/24/22, and right thoracentesis on 7/13 and 08/03/22.  She has a past medical history of metastatic breast cancer with recurrent malignant right pleural effusion, hypertension, depression, anemia.  She was recently admitted with encephalopathy secondary to sepsis, respiratory failure, transient a flutter, chronic cancer related pain.  Culture from latest thoracentesis grew Staph aureus.  Request now received from CCM for right chest drain placement.  Imaging studies have been reviewed by Dr. Dwaine Gale.Risks and benefits discussed with the patient/mother and sister including bleeding, infection, damage to adjacent structures, pneumothorax, and sepsis.  All of the patient's questions were answered, patient is agreeable to proceed. Consent signed and in chart.  Procedure scheduled for today.     Electronically Signed: D. Rowe Robert, PA-C 08/08/2022, 9:49 AM   I spent a total of 25 Minutes at the the patient's bedside AND on the patient's [facility] floor or unit, greater than 50% of which was counseling/coordinating care for CT-guided right chest drain placement    Patient ID: [name], female   DOB: March 07, 1961, 61 y.o.   MRN: [id number]

## 2022-08-08 NOTE — Procedures (Signed)
Interventional Radiology Procedure Note  Procedure: CT Guided Right Chest Tube (14 fr)  Indication: Empyema  Findings: Please refer to procedural dictation for full description.  Complications: None  EBL: < 10 mL  Miachel Roux, MD [phone]

## 2022-08-08 NOTE — Progress Notes (Signed)
Calorie Count Note  Calorie count ordered. Envelope on door. No meal tickets available. No documentation in flowsheets. Unable to ask patient at this time. Now pt is NPO for IR placement of right chest drain. Reaching out to RN about pt's breakfast this morning.  Diet: CLD for breakfast on 11/9,  Regular diet for lunch and dinner 11/9 Supplements: Ensure Plus High Protein po BID, each supplement provides 350 kcal and 20 grams of protein.   Breakfast 11/10: 1/2 cup jello, 1 cranberry juice (120 kcals) per RN Lunch: UTA Dinner: UTA Supplements: not accepting Ensure  Total intake: Unable to calculate  Nutrition Dx: severe protein calorie malnutrition r/t metastatic cancer AEB significant 17.4% unintended weight loss in 4 months, severe muscle wasting and severe fat loss.   Goal: Pt to meet >/= 90% of their estimated nutrition needs   Intervention:  -Complete Calorie Count once pt is on consistent diet.  -Continue Ensure Plus High Protein BID  Clayton Bibles, MS, RD, LDN Inpatient Clinical Dietitian Contact information available via Amion

## 2022-08-08 NOTE — Progress Notes (Signed)
[name]   DOB:[birth year]   GE#:[id number]    ASSESSMENT & PLAN:  Metastatic breast cancer to the lung and bones While she responded very well to chemotherapy and antiestrogen therapy, she is not thriving due to progressive weight loss and weakness While hospitalized, I recommend we continue on antiestrogen therapy but hold abemaciclib I have discussed the plan of care with family Unfortunately, she is now found to have MSSA empyema Overall, she is on the trajectory of general decline Continue best supportive care for now but I did warn the patient that she may not make it out of the hospital if she does not improve with aggressive intervention   Altered mental status, resolved Multifactorial, could be due to narcotic prescription She denies pain right now   Recurrent malignant pleural effusion with MSSA empyema She has persistent pleural effusion despite thoracentesis I have a long discussion with critical care team yesterday I reviewed with the patient the rationale of placement of drainage catheter and potentially chest tube placement The patient and family finally agreed for interventional radiologist to placed drainage tube I have spoken with the team regarding cancer involvement of her chest wall, to make sure that the tube is not going through her involved chest wall areas   malignant cachexia Failure to thrive Severe protein calorie malnutrition This is multifactorial, a component of noncompliance and poor access to food Recommend dietitian review and additional nutritional supplement as tolerated Recommend advancing her diet to general diet as tolerated   Generalized weakness Failure to thrive Multifactorial, related to progressive weight loss, muscle loss and deconditioning Recommend skilled nursing facility placement upon discharge   Code Status With a progressive decline in empyema, I reviewed the CODE STATUS with the patient twice on November 9 Ultimately, she  agreed with changing her CODE STATUS to DO NOT RESUSCITATE   Goals of care Improvement of weakness, infection and nutrition status   Discharge planning Unknown at this point I will return on Monday to check on her Plan of care is fully discussed with her family  All questions were answered. The patient knows to call the clinic with any problems, questions or concerns.   The total time spent in the appointment was 55 minutes encounter with patients including review of chart and various tests results, discussions about plan of care and coordination of care plan  Heath Lark, MD 08/08/2022 9:45 AM  Subjective:  She is seen in the ICU.  I spoke with her mother over the phone as well as her sister She denies significant pain We have made informed decision to proceed for placement of drainage tube to her chest due to empyema.  She is able to eat some food without difficulties.  Denies constipation  Objective:  Vitals:   08/08/22 0930 08/08/22 0935  BP: (!) 132/111   Pulse: (!) 49 70  Resp: (!) 28 19  Temp:    SpO2: (!) 87% 95%     Intake/Output Summary (Last 24 hours) at 08/08/2022 0945 Last data filed at 08/08/2022 4010 Gross per 24 hour  Intake 1137.93 ml  Output 1100 ml  Net 37.93 ml    GENERAL:alert, no distress and comfortable NEURO: alert & oriented x 3 with fluent speech, no focal motor/sensory deficits   Labs:  Recent Labs    08/05/22 0458 08/06/22 0456 08/06/22 1549 08/07/22 0428 08/08/22 0500  NA 136 136  --  137 138  K 4.0 4.2  --  4.2 4.2  CL 103 102  --  105 104  CO2 25 28  --  28 29  GLUCOSE 129* 124*  --  135* 135*  BUN 33* 35*  --  30* 25*  CREATININE 1.85* 1.38*  --  1.08* 0.80  CALCIUM 7.1* 8.0*  --  7.7* 7.8*  GFRNONAA 30* 43*  --  58* >60  PROT 6.3* 6.0* 6.1* 6.0*  --   ALBUMIN 2.4* 2.3*  --  2.2* 2.1*  AST 22 18  --  20  --   ALT 23 21  --  20  --   ALKPHOS 50 49  --  49  --   BILITOT 0.3 0.4  --  0.3  --     Studies:  DG Chest Port 1  View  Result Date: 08/08/2022 CLINICAL DATA:  Follow-up pleural effusion. EXAM: PORTABLE CHEST 1 VIEW COMPARISON:  08/07/2022 FINDINGS: Exam detail is diminished due to patient positioning with significant rightward rotational artifact. The cardiomediastinal contours appear unchanged. There is a large right pleural effusion which is unchanged in volume compared with the previous exam. There is significantly diminished aeration to the right lung with only a small amount of aerated lung in the right upper lobe. Small left pleural effusion is unchanged. IMPRESSION: 1. No change in large right pleural effusion with significantly diminished aeration to the right lung. Electronically Signed   By: Kerby Moors M.D.   On: 08/08/2022 07:05   DG Chest Port 1 View  Result Date: 08/07/2022 CLINICAL DATA:  Pleural effusion EXAM: PORTABLE CHEST 1 VIEW COMPARISON:  08/06/2022 FINDINGS: No significant change in rotated AP portable examination. Large right pleural effusion with near total atelectasis or consolidation of the right lung, as well as a small, layering left pleural effusion. Cardiomegaly, the cardiac borders largely obscured. IMPRESSION: No significant change in rotated AP portable examination. Large right pleural effusion with near total atelectasis or consolidation of the right lung, as well as a small, layering left pleural effusion. Electronically Signed   By: Delanna Ahmadi M.D.   On: 08/07/2022 09:17   DG Chest Port 1 View  Result Date: 08/06/2022 CLINICAL DATA:  S/P thoracentesis EXAM: PORTABLE CHEST 1 VIEW COMPARISON:  Same day chest x-ray. FINDINGS: Decreased but still large right pleural effusion. Small left pleural effusion. Bilateral overlying opacities. No visible pneumothorax on this semi erect radiograph. Cardiomediastinal silhouette is similar. Polyarticular degenerative change. IMPRESSION: 1. Decreased but still large right pleural effusion. No visible pneumothorax on this semi erect  radiograph. 2. Small left pleural effusion 3. Bilateral overlying opacities. Electronically Signed   By: Margaretha Sheffield M.D.   On: 08/06/2022 15:50   DG Chest 1 View  Result Date: 08/06/2022 CLINICAL DATA:  Respiratory failure. EXAM: CHEST  1 VIEW COMPARISON:  Multiple recent chest x-rays. FINDINGS: Persistent complete opacification of the right hemithorax due to a large pleural effusion. Slight shift of the heart and mediastinum to the left exaggerated by the rotation of the patient and thoracic scoliosis. Persistent left lower lobe atelectasis but no left pleural effusion. IMPRESSION: Persistent complete opacification of the right hemithorax. Electronically Signed   By: Marijo Sanes M.D.   On: 08/06/2022 11:48   DG Chest Port 1 View  Result Date: 08/05/2022 CLINICAL DATA:  Shortness of breath EXAM: PORTABLE CHEST 1 VIEW COMPARISON:  08/05/2022, 06/25/2022, 08/03/2022 FINDINGS: Complete opacification of right thorax without change. Subsegmental atelectasis left base. Partially obscured cardiomediastinal silhouette. IMPRESSION: Complete opacification of the right thorax without change, probably due to sizable pleural effusion. Subsegmental atelectasis  left base Electronically Signed   By: Donavan Foil M.D.   On: 08/05/2022 23:33   ECHOCARDIOGRAM COMPLETE  Result Date: 08/05/2022    ECHOCARDIOGRAM REPORT   Patient Name:   [name] Date of Exam: 08/05/2022 Medical Rec #:  [id number]       Height:       66.0 in Accession #:    [id number]      Weight:       118.6 lb Date of Birth:  1961/06/30        BSA:          1.602 m Patient Age:    28 years        BP:           97/44 mmHg Patient Gender: F               HR:           78 bpm. Exam Location:  Inpatient Procedure: 2D Echo, Cardiac Doppler and Color Doppler Indications:    R94.31 Abnormal EKG  History:        Patient has no prior history of Echocardiogram examinations.                 Risk Factors:Hypertension. Metastatic breast cancer.   Sonographer:    [place] Referring Phys: [id number] AMRIT ADHIKARI  Sonographer Comments: Technically difficult study due to poor echo windows, suboptimal apical window and suboptimal subcostal window. Apical images off axis due to the hardened texture of the left breast. Could not turn patient. Wound dressings in subcostal region. IMPRESSIONS  1. Left ventricular ejection fraction, by estimation, is 55 to 60%. The left ventricle has normal function. The left ventricle has no regional wall motion abnormalities. Left ventricular diastolic parameters were normal.  2. Right ventricular systolic function is normal. The right ventricular size is normal. Mildly increased right ventricular wall thickness. There is mildly elevated pulmonary artery systolic pressure. The estimated right ventricular systolic pressure is 85.4 mmHg.  3. Left atrial size was mildly dilated.  4. A small pericardial effusion is present. The pericardial effusion is circumferential. There is no evidence of cardiac tamponade. Large pleural effusion in the right lateral region.  5. 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 Tricuspid valve regurgitation is mild to moderate.  7. The aortic valve is tricuspid. Aortic valve regurgitation is not visualized. No aortic stenosis is present.  8. The inferior vena cava is normal in size with greater than 50% respiratory variability, suggesting right atrial pressure of 3 mmHg. FINDINGS  Left Ventricle: Left ventricular ejection fraction, by estimation, is 55 to 60%. The left ventricle has normal function. The left ventricle has no regional wall motion abnormalities. The left ventricular internal cavity size was normal in size. There is  no left ventricular hypertrophy. Left ventricular diastolic parameters were normal. Right Ventricle: The right ventricular size is normal.  Mildly increased right ventricular wall thickness. Right ventricular systolic function is normal. There is mildly elevated pulmonary artery systolic pressure. The tricuspid regurgitant velocity is 3.19 m/s, and with an assumed right atrial pressure of 3 mmHg, the estimated right ventricular systolic pressure is 62.7 mmHg. Left Atrium: Left atrial size was mildly dilated. Right Atrium: Right atrial size was normal in size. Pericardium: Complex septated fluid collection in the right pleural cavity, consider empyema or malignant effusion. A small  pericardial effusion is present. The pericardial effusion is circumferential. There is no evidence of cardiac tamponade. 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 There is mild thickening of the mitral valve leaflet(s). Mild mitral valve regurgitation, with centrally-directed jet. Tricuspid Valve: The tricuspid valve is myxomatous. Tricuspid valve regurgitation is mild to moderate. Aortic Valve: The aortic valve is tricuspid. Aortic valve regurgitation is not visualized. No aortic stenosis is present. Pulmonic Valve: The pulmonic valve was normal in structure. Pulmonic valve regurgitation is mild. Aorta: The aortic root and ascending aorta are structurally normal, with no evidence of dilitation. Venous: IVC assessment for right atrial pressure unable to be performed due to mechanical ventilation. The inferior vena cava is normal in size with greater than 50% respiratory variability, suggesting right atrial pressure of 3 mmHg. IAS/Shunts: No atrial level shunt detected by color flow Doppler. Additional Comments: There is a large pleural effusion in the right lateral region.  LEFT VENTRICLE PLAX 2D LVIDd:         4.43 cm     Diastology LVIDs:         2.80 cm     LV e' medial:    5.08 cm/s LV PW:         1.03 cm     LV E/e' medial:  10.3 LV IVS:        0.87 cm     LV e' lateral:   6.46 cm/s LVOT diam:      2.10 cm     LV E/e' lateral: 8.1 LV SV:         44 LV SV Index:   28 LVOT Area:     3.46 cm  LV Volumes (MOD) LV vol d, MOD A4C: 57.5 ml LV vol s, MOD A4C: 18.7 ml LV SV MOD A4C:     57.5 ml RIGHT VENTRICLE             IVC RV S prime:     13.10 cm/s  IVC diam: 1.10 cm TAPSE (M-mode): 1.7 cm LEFT ATRIUM           Index        RIGHT ATRIUM           Index LA diam:      3.20 cm 2.00 cm/m   RA Area:     14.20 cm LA Vol (A4C): 57.5 ml 35.90 ml/m  RA Volume:   35.90 ml  22.42 ml/m  AORTIC VALVE             PULMONIC VALVE LVOT Vmax:   75.15 cm/s  PR End Diast Vel: 2.14 msec LVOT Vmean:  45.850 cm/s LVOT VTI:    0.127 m  AORTA Ao Root diam: 2.60 cm Ao Asc diam:  2.70 cm MITRAL VALVE               TRICUSPID VALVE MV Area (PHT): 4.89 cm    TR Peak grad:   40.7 mmHg MV Decel Time: 155 msec    TR Vmax:        319.00 cm/s MV E velocity: 52.40 cm/s MV A velocity: 50.00 cm/s  SHUNTS MV E/A ratio:  1.05        Systemic VTI:  0.13 m                            Systemic Diam: 2.10 cm  Sanda Klein MD Electronically signed by Sanda Klein MD Signature Date/Time: 08/05/2022/12:38:14 PM    Final    DG CHEST PORT 1 VIEW  Result Date: 08/05/2022 CLINICAL DATA:  Right pleural effusion. EXAM: PORTABLE CHEST 1 VIEW COMPARISON:  08/05/2022 FINDINGS: Stable cardiomediastinal contours. Left lung is clear. Complete opacification of the right hemithorax is again noted and appears unchanged. Findings are favored to represent known large right pleural effusion. IMPRESSION: 1. No change in complete opacification of the right hemithorax which is presumed to represent large right pleural effusion. Electronically Signed   By: Kerby Moors M.D.   On: 08/05/2022 10:06   DG CHEST PORT 1 VIEW  Result Date: 08/05/2022 CLINICAL DATA:  Respiratory compromise EXAM: PORTABLE CHEST 1 VIEW COMPARISON:  08/03/2022 FINDINGS: Complete opacification of the right hemithorax, presumably reflecting a large pleural effusion. Left lung is clear. No  pneumothorax. The heart is normal in size. IMPRESSION: Complete opacification of the right hemithorax, presumably reflecting a large pleural effusion. Electronically Signed   By: Julian Hy M.D.   On: 08/05/2022 00:41   US THORACENTESIS ASP PLEURAL SPACE W/IMG GUIDE  Result Date: 08/03/2022 INDICATION: Patient with history of right-sided breast cancer, right pleural effusion. Request is made for diagnostic and therapeutic thoracentesis. EXAM: ULTRASOUND GUIDED RIGHT THORACENTESIS MEDICATIONS: 10 mL 1% lidocaine COMPLICATIONS: None immediate. PROCEDURE: An ultrasound guided thoracentesis was thoroughly discussed with the patient and questions answered. The benefits, risks, alternatives and complications were also discussed. The patient understands and wishes to proceed with the procedure. Written consent was obtained. Ultrasound was performed to localize and mark an adequate pocket of fluid in the right chest. The area was then prepped and draped in the normal sterile fashion. 1% Lidocaine was used for local anesthesia. Under ultrasound guidance a 6 Fr Safe-T-Centesis catheter was introduced. Thoracentesis was performed. The catheter was removed and a dressing applied. FINDINGS: A total of approximately 800 mL of yellow fluid was removed. Samples were sent to the laboratory as requested by the clinical team. IMPRESSION: Successful ultrasound guided right thoracentesis yielding 800 mL of pleural fluid. Read by: Brynda Greathouse PA-C Electronically Signed   By: Sandi Mariscal M.D.   On: 08/03/2022 13:06   DG Chest Port 1 View  Result Date: 08/03/2022 CLINICAL DATA:  Post right thoracentesis EXAM: PORTABLE CHEST 1 VIEW COMPARISON:  08/02/2022 FINDINGS: Continued complete opacification of the right hemithorax. No visible pneumothorax. No confluent opacity or effusion on the left. Heart is normal size. IMPRESSION: Continued complete opacification of the right hemithorax. No visible pneumothorax. Electronically  Signed   By: Rolm Baptise M.D.   On: 08/03/2022 11:52   DG Chest Portable 1 View  Result Date: 08/02/2022 CLINICAL DATA:  Weakness and breast cancer.  Dehydration.  Fatigue. EXAM: PORTABLE CHEST 1 VIEW COMPARISON:  Chest CT 06/25/2022 FINDINGS: Increased right pleural effusion with complete opacification of the right hemithorax. There is leftward shift of the trachea and mediastinal structures. No aerated right lung is seen. No focal airspace disease in the left lung. Left lung nodules on prior CT are not seen by radiograph. No visible pneumothorax. Osseous metastatic disease on CT not well demonstrated. IMPRESSION: Increased right pleural effusion with complete opacification of the right hemithorax. No aerated right lung is seen. Leftward shift of the trachea and mediastinal structures. Electronically Signed   By: Keith Rake M.D.   On: 08/02/2022 16:53

## 2022-08-08 NOTE — Progress Notes (Signed)
No need of bipap at this time. Pt is resting well at this time.

## 2022-08-09 ENCOUNTER — Inpatient Hospital Stay (HOSPITAL_COMMUNITY): Payer: BC Managed Care – PPO

## 2022-08-09 DIAGNOSIS — J90 Pleural effusion, not elsewhere classified: Secondary | ICD-10-CM | POA: Diagnosis not present

## 2022-08-09 LAB — BASIC METABOLIC PANEL
Anion gap: 3 — ABNORMAL LOW (ref 5–15)
BUN: 22 mg/dL (ref 8–23)
CO2: 29 mmol/L (ref 22–32)
Calcium: 7.4 mg/dL — ABNORMAL LOW (ref 8.9–10.3)
Chloride: 106 mmol/L (ref 98–111)
Creatinine, Ser: 0.73 mg/dL (ref 0.44–1.00)
GFR, Estimated: >60 mL/min (ref >60)
Glucose, Bld: 153 mg/dL — ABNORMAL HIGH (ref 70–99)
Potassium: 3.9 mmol/L (ref 3.5–5.1)
Sodium: 138 mmol/L (ref 135–145)

## 2022-08-09 LAB — BODY FLUID CULTURE W GRAM STAIN

## 2022-08-09 LAB — CBC
HCT: 27.1 % — ABNORMAL LOW (ref 36.0–46.0)
Hemoglobin: 8.5 g/dL — ABNORMAL LOW (ref 12.0–15.0)
MCH: 26.1 pg (ref 26.0–34.0)
MCHC: 31.4 g/dL (ref 30.0–36.0)
MCV: 83.1 fL (ref 80.0–100.0)
Platelets: 498 10*3/uL — ABNORMAL HIGH (ref 150–400)
RBC: 3.26 MIL/uL — ABNORMAL LOW (ref 3.87–5.11)
RDW: 16.4 % — ABNORMAL HIGH (ref 11.5–15.5)
WBC: 11.6 10*3/uL — ABNORMAL HIGH (ref 4.0–10.5)
nRBC: 0 % (ref 0.0–0.2)

## 2022-08-09 NOTE — Progress Notes (Signed)
No need of bipap at this time.  

## 2022-08-09 NOTE — Progress Notes (Signed)
  Daily Progress Note   Patient Name: [name]       Date: 08/09/2022 DOB: 09-13-1961  Age: 61 y.o. MRN#: [id number] Attending Physician: Marshell Garfinkel, MD Primary Care Physician: Heath Lark, MD Admit Date: 08/02/2022 Length of Stay: 5 days  Discussed care with primary provider today. Patient last seen by this palliative provider on 11/8.  Patient's oncologist, Dr. Alvy Bimler, has been leading goals of care discussions with patient and her family as she has a well-established relationship with them and they trust her expertise.  At this time, appropriately deferring discussions to Dr. Alvy Bimler. Palliative medicine team will sign off though please reach out if our team can be of assistance in the future.  Thank you for involving our team in patient's care.     Chelsea Aus, DO Palliative Care Provider PMT # [phone]

## 2022-08-09 NOTE — Progress Notes (Signed)
NAME:  [name], MRN:  [id number], DOB:  [birth year], LOS: 5 ADMISSION DATE:  08/02/2022, CONSULTATION DATE:  08/05/22 REFERRING MD:  Gershon Cull , CHIEF COMPLAINT:   Lethargy, ams   BRIEF  61 year old with metastatic breast cancer, hypertension with recurrent right-sided malignant effusion presenting with decreased appetite, weakness and lethargy.  She underwent thoracentesis on admission showing MSSA empyema.  She underwent emergent thoracentesis 11/8.  Remains on pressors with tenuous clinical status.  Pertinent  Medical History  HTN Breast cancer - R chest wall, contralateral breast (L. Mets to lung and bones.  Responding well to chemo/antiestrogen.   Failutre to thrive, weight loss, malignant cachexia  Malignant pleural effusion R   Home meds: abemaciclib, arimidex, calcium carbonate, vit D, Dilaudid '8mg'$  q6 prn  Mag oxide, mirtazapine 7.'5mg'$  qhs  Miralax, senna  Flagyl gel   Significant Hospital Events: Including procedures, antibiotic start and stop dates in addition to other pertinent events   03/23/2022: Right-sided thoracentesis: LDH 259.  White cells 207.  8% polymorphs. Malignant cells consistent with breast cancer present xxxxxxxxxxxxxxxxxx 08/02/2022 - admit MRSA PCR neg 08/03/2022: Right thoracentesis 800 mL and stopped due to pain LDH 439, white cell 7100/84% polymorphs > huge increase in white count and left shift. MSSA gorwing from pleural fluids 08/03/22 08/05/22 - ccm consult and ICU tx Left groin CVL Left groin a-line  08/05/22 -last Narcan was at 4 AM.  Now completely awake on BiPAP.  Wants BiPAP off.  No respiratory distress.  Remains on vasopressors Levophed 9 mcg and vasopressin.  Diastolic is low at 40 with map of 71.  She has left femoral arterial line and central line.  She is oriented and communicating.  She remains afebrile.  White count is high.  She is on antibiotics vancomycin and cefepime. ECHO - ef65%. RSVP 45+., Mild to mod TR. Mild MR Family not  in favor for pleurx. Dr Alvy Bimler recommending 1 more thora 08/06/22 worsening effusion with ?tension physiology s/p emergent thora. Repeat pleural Cx showing staph 11/10 after extensive patient with family informed decision made for right pigtail catheter by IR  Interim History / Subjective:   Right pigtail catheter placed by IR.  Has drained about 2.9L Off pressors today AM.  Objective   Blood pressure 111/69, pulse 81, temperature (!) 97.4 F (36.3 C), temperature source Axillary, resp. rate (!) 38, height '5\' 6"'$  (1.676 m), weight 57.4 kg, SpO2 100 %.        Intake/Output Summary (Last 24 hours) at 08/09/2022 0726 Last data filed at 08/09/2022 0500 Gross per 24 hour  Intake 1695.8 ml  Output 3790 ml  Net -2094.2 ml   Filed Weights   08/05/22 0500 08/07/22 0500 08/08/22 0500  Weight: 53.8 kg 57 kg 57.4 kg    Exam: Gen:      No acute distress, frail, malnourished HEENT:  EOMI, sclera anicteric Neck:     No masses; no thyromegaly Lungs:    Clear to auscultation bilaterally; normal respiratory effort CV:         Regular rate and rhythm; no murmurs Abd:      + bowel sounds; soft, non-tender; no palpable masses, no distension Ext:    No edema; adequate peripheral perfusion Skin:      Warm and dry; no rash Neuro: Asleep, arousable  Labs/Imaging reviewed Chest x-ray 11/11 reviewed  Resolved Hospital Problem list   Malignant effusion on the right side MSSA edema Acute respiratory failure with hypoxia S/p thora 11/5, 11/8. Chest tube  placement by interventional radiology on 11/10 Continues to be growing some Staph aureus.    Septic shock, MSSA empyema, present on admission Continue midodrine Off pressors Continue hydrocortisone Continue Ancef.  Follow cultures  Transient Atrial flutter on admission Continue Telemetry Monitoring  Chronic cancer related pain -on opioids at home Chronic depression on Remeron at home - resolved 08/05/22 [place].   Severe cachexia, severe  protein calorie malnutrition present on admission Dietitian on board.  Nutrition supplements Megace  Encourage p.o. intake  Chronic anemia secondary to critical illness, malignancy Monitor CBC  Metastatic breast cancer Continue antiestrogen therapy.  Hold abemaciclib   Goals of care Made DNR  after discussion with Dr. Alvy Bimler Palliative care is on board Prognosis is extremely guarded given significant comorbidities  Best Practice (right click and "Reselect all SmartList Selections" daily)  Diet/type: Regular consistency (see orders)  DVT prophylaxis: other GI prophylaxis: PEPCID oral Lines: Central line Femoral CVL, A line Foley:  [place] Code Status:  DNR  Signature:   The patient is critically ill with multiple organ system failure and requires high complexity decision making for assessment and support, frequent evaluation and titration of therapies, advanced monitoring, review of radiographic studies and interpretation of complex data.   Critical Care Time devoted to patient care services, exclusive of separately billable procedures, described in this note is 35 minutes.   Marshell Garfinkel MD [facility] See Amion for pager  If no response to pager , please call [phone] until 7pm After 7:00 pm call Elink  [phone] 08/09/2022, 7:51 AM

## 2022-08-10 ENCOUNTER — Inpatient Hospital Stay (HOSPITAL_COMMUNITY): Payer: BC Managed Care – PPO

## 2022-08-10 DIAGNOSIS — J90 Pleural effusion, not elsewhere classified: Secondary | ICD-10-CM | POA: Diagnosis not present

## 2022-08-10 LAB — BASIC METABOLIC PANEL
Anion gap: 5 (ref 5–15)
BUN: 23 mg/dL (ref 8–23)
CO2: 28 mmol/L (ref 22–32)
Calcium: 7.4 mg/dL — ABNORMAL LOW (ref 8.9–10.3)
Chloride: 107 mmol/L (ref 98–111)
Creatinine, Ser: 0.72 mg/dL (ref 0.44–1.00)
GFR, Estimated: >60 mL/min (ref >60)
Glucose, Bld: 179 mg/dL — ABNORMAL HIGH (ref 70–99)
Potassium: 3.8 mmol/L (ref 3.5–5.1)
Sodium: 140 mmol/L (ref 135–145)

## 2022-08-10 LAB — CBC
HCT: 28 % — ABNORMAL LOW (ref 36.0–46.0)
Hemoglobin: 9 g/dL — ABNORMAL LOW (ref 12.0–15.0)
MCH: 26.3 pg (ref 26.0–34.0)
MCHC: 32.1 g/dL (ref 30.0–36.0)
MCV: 81.9 fL (ref 80.0–100.0)
Platelets: 520 10*3/uL — ABNORMAL HIGH (ref 150–400)
RBC: 3.42 MIL/uL — ABNORMAL LOW (ref 3.87–5.11)
RDW: 16.2 % — ABNORMAL HIGH (ref 11.5–15.5)
WBC: 12.1 10*3/uL — ABNORMAL HIGH (ref 4.0–10.5)
nRBC: 0 % (ref 0.0–0.2)

## 2022-08-10 MED ORDER — SODIUM CHLORIDE (PF) 0.9 % IJ SOLN
10.0000 mg | Freq: Once | INTRAMUSCULAR | Status: AC
  Administered 2022-08-10: 10 mg via INTRAPLEURAL
  Filled 2022-08-10: qty 10

## 2022-08-10 MED ORDER — SODIUM CHLORIDE 0.9% FLUSH
10.0000 mL | Freq: Three times a day (TID) | INTRAVENOUS | Status: DC
  Administered 2022-08-10 – 2022-08-19 (×29): 10 mL via INTRAPLEURAL

## 2022-08-10 MED ORDER — STERILE WATER FOR INJECTION IJ SOLN
5.0000 mg | Freq: Once | RESPIRATORY_TRACT | Status: AC
  Administered 2022-08-10: 5 mg via INTRAPLEURAL
  Filled 2022-08-10: qty 5

## 2022-08-10 NOTE — Progress Notes (Addendum)
NAME:  [name], MRN:  [id number], DOB:  [birth year], LOS: 6 ADMISSION DATE:  08/02/2022, CONSULTATION DATE:  08/05/22 REFERRING MD:  Gershon Cull , CHIEF COMPLAINT:   Lethargy, ams   BRIEF  61 year old with metastatic breast cancer, hypertension with recurrent right-sided malignant effusion presenting with decreased appetite, weakness and lethargy.  She underwent thoracentesis on admission showing MSSA empyema.  She underwent emergent thoracentesis 11/8.  Remains on pressors with tenuous clinical status.  Pertinent  Medical History  HTN Breast cancer - R chest wall, contralateral breast (L. Mets to lung and bones.  Responding well to chemo/antiestrogen.   Failutre to thrive, weight loss, malignant cachexia  Malignant pleural effusion R   Home meds: abemaciclib, arimidex, calcium carbonate, vit D, Dilaudid '8mg'$  q6 prn  Mag oxide, mirtazapine 7.'5mg'$  qhs  Miralax, senna  Flagyl gel   Significant Hospital Events: Including procedures, antibiotic start and stop dates in addition to other pertinent events   03/23/2022: Right-sided thoracentesis: LDH 259.  White cells 207.  8% polymorphs. Malignant cells consistent with breast cancer present xxxxxxxxxxxxxxxxxx 08/02/2022 - admit MRSA PCR neg 08/03/2022: Right thoracentesis 800 mL and stopped due to pain LDH 439, white cell 7100/84% polymorphs > huge increase in white count and left shift. MSSA gorwing from pleural fluids 08/03/22 08/05/22 - ccm consult and ICU tx Left groin CVL Left groin a-line  08/05/22 -last Narcan was at 4 AM.  Now completely awake on BiPAP.  Wants BiPAP off.  No respiratory distress.  Remains on vasopressors Levophed 9 mcg and vasopressin.  Diastolic is low at 40 with map of 71.  She has left femoral arterial line and central line.  She is oriented and communicating.  She remains afebrile.  White count is high.  She is on antibiotics vancomycin and cefepime. ECHO - ef65%. RSVP 45+., Mild to mod TR. Mild MR Family not  in favor for pleurx. Dr Alvy Bimler recommending 1 more thora 08/06/22 worsening effusion with ?tension physiology s/p emergent thora. Repeat pleural Cx showing staph 11/10 after extensive patient with family informed decision made for right pigtail catheter by IR  Interim History / Subjective:   1070 ml output from the chest tube. No new complaints  Objective   Blood pressure (!) 142/77, pulse 81, temperature 98 F (36.7 C), temperature source Oral, resp. rate 19, height '5\' 6"'$  (1.676 m), weight 63.6 kg, SpO2 95 %.        Intake/Output Summary (Last 24 hours) at 08/10/2022 0740 Last data filed at 08/10/2022 0500 Gross per 24 hour  Intake 813.53 ml  Output 1670 ml  Net -856.47 ml   Filed Weights   08/07/22 0500 08/08/22 0500 08/10/22 0500  Weight: 57 kg 57.4 kg 63.6 kg    Exam: Gen:      No acute distress, frail, malnourished HEENT:  EOMI, sclera anicteric Neck:     No masses; no thyromegaly Lungs:    Clear to auscultation bilaterally; normal respiratory effort CV:         Regular rate and rhythm; no murmurs Abd:      + bowel sounds; soft, non-tender; no palpable masses, no distension Ext:    No edema; adequate peripheral perfusion Skin:      Warm and dry; no rash Neuro: alert and oriented x 3 Psych: normal mood and affect   Labs/Imaging reviewed WBC 12.1, hemoglobin 9.0, platelets 520 Chest x-ray reviewed with diffuse airspace disease in the right and left base worsening  Resolved Hospital Problem list  Malignant effusion on the right side MSSA empyema Monitor chest tube output  Septic shock, MSSA empyema, present on admission S/p thora 11/5, 11/8. Chest tube placement by interventional radiology on 11/10 On cefazolin for MSSA Get CT chest today for better look at the lungs Continue midodrine Off pressors.  Remove femoral central line Continue hydrocortisone  Transient Atrial flutter on admission Continue Telemetry Monitoring  Chronic cancer related pain -on  opioids at home Chronic depression on Remeron at home - resolved 08/05/22 [place].   Severe cachexia, severe protein calorie malnutrition present on admission Dietitian on board.  Nutrition supplements Megace  Encourage p.o. intake  Chronic anemia secondary to critical illness, malignancy Monitor CBC  Metastatic breast cancer Continue antiestrogen therapy.  Hold abemaciclib   Goals of care Made DNR  after discussion with Dr. Alvy Bimler Palliative care has signed off Prognosis is guarded given significant co morbidities  Transfer out of ICU and to hospitalist service. PCCM will follow for chest tube management  Best Practice (right click and "Reselect all SmartList Selections" daily)  Diet/type: Regular consistency (see orders)  DVT prophylaxis: other GI prophylaxis: PEPCID oral Lines: Central line and No longer needed.  Order written to d/c   Foley:  PUREWICK Code Status:  DNR  Signature:   Marshell Garfinkel MD [facility] See Amion for pager  If no response to pager , please call [phone] until 7pm After 7:00 pm call Elink  [phone] 08/10/2022, 7:50 AM

## 2022-08-10 NOTE — Progress Notes (Signed)
PT Cancellation Note  Patient Details Name: [name] MRN: [id number] DOB: [birth year]   Cancelled Treatment:    Reason Eval/Treat Not Completed: Other (comment). Pt still with fem A-line, noted Oncology states there is a general trajectory of decline. Palliative signed off. Will hold PT today.    [facility] 08/10/2022, 9:15 AM

## 2022-08-10 NOTE — Progress Notes (Signed)
Bipap not needed at this time. °

## 2022-08-10 NOTE — Procedures (Signed)
Pleural Fibrinolytic Administration Procedure Note  [name]  [id number]  [birth year]  Date:08/10/22  Time:3:53 PM   Provider Performing:Lougenia Morrissey   Procedure: Pleural Fibrinolysis Initial day [phone])  Indication(s) Fibrinolysis of complicated pleural effusion  Consent Risks of the procedure as well as the alternatives and risks of each were explained to the patient and/or caregiver.  Consent for the procedure was obtained.   Anesthesia None   Time Out Verified patient identification, verified procedure, site/side was marked, verified correct patient position, special equipment/implants available, medications/allergies/relevant history reviewed, required imaging and test results available.   Sterile Technique Hand hygiene, gloves   Procedure Description Existing pleural catheter was cleaned and accessed in sterile manner.  '10mg'$  of tPA in 30cc of saline and '5mg'$  of dornase in 30cc of sterile water were injected into pleural space using existing pleural catheter.  Catheter will be clamped for 1 hour and then placed back to suction.   Complications/Tolerance None; patient tolerated the procedure well.   EBL None   Specimen(s) None  Marshell Garfinkel MD [facility] See Amion for pager  If no response to pager , please call [phone] until 7pm After 7:00 pm call Elink  [phone] 08/10/2022, 3:54 PM

## 2022-08-10 NOTE — Progress Notes (Signed)
PCCM note  CT chest reviewed with large complex loculated right hydropneumothorax with moderate left effusion.  Unfortunately our options are limited.  We can try intrapleural lytics but I am not too hopeful that this will be adequate to completely evacuate the effusion and expand the lung as it appears entrapped.  She is not a candidate for surgery  I had a long discussion with mother at bedside and sister over telephone today.  Explained that we can try the intrapleural lytics as this is the only thing we can offer for her.  If this does not work then prognosis for recovery is extremely poor and we may have to consider changing goals of care.  We discussed risks of intrapleural lytics including pain, 4% risk of bleeding. They are aware of this and have agreed to proceed.  Marshell Garfinkel MD [facility] See Amion for pager  If no response to pager , please call [phone] until 7pm After 7:00 pm call Elink  [phone] 08/10/2022, 2:47 PM

## 2022-08-10 NOTE — Progress Notes (Signed)
Called to provide support for patient and family. Met with and shared  in conversation with patient and mother bedside. Patient relies  strongly on her faith. Ended visit with prayer requested by mother.

## 2022-08-11 ENCOUNTER — Inpatient Hospital Stay (HOSPITAL_COMMUNITY): Payer: BC Managed Care – PPO

## 2022-08-11 DIAGNOSIS — J9601 Acute respiratory failure with hypoxia: Secondary | ICD-10-CM

## 2022-08-11 DIAGNOSIS — J869 Pyothorax without fistula: Secondary | ICD-10-CM | POA: Diagnosis not present

## 2022-08-11 DIAGNOSIS — A4901 Methicillin susceptible Staphylococcus aureus infection, unspecified site: Secondary | ICD-10-CM

## 2022-08-11 DIAGNOSIS — E43 Unspecified severe protein-calorie malnutrition: Secondary | ICD-10-CM

## 2022-08-11 DIAGNOSIS — I480 Paroxysmal atrial fibrillation: Secondary | ICD-10-CM

## 2022-08-11 DIAGNOSIS — J910 Malignant pleural effusion: Secondary | ICD-10-CM | POA: Diagnosis not present

## 2022-08-11 DIAGNOSIS — Z4682 Encounter for fitting and adjustment of non-vascular catheter: Secondary | ICD-10-CM

## 2022-08-11 DIAGNOSIS — C7981 Secondary malignant neoplasm of breast: Secondary | ICD-10-CM

## 2022-08-11 DIAGNOSIS — F32A Depression, unspecified: Secondary | ICD-10-CM

## 2022-08-11 DIAGNOSIS — J90 Pleural effusion, not elsewhere classified: Secondary | ICD-10-CM | POA: Diagnosis not present

## 2022-08-11 DIAGNOSIS — D638 Anemia in other chronic diseases classified elsewhere: Secondary | ICD-10-CM

## 2022-08-11 HISTORY — DX: Pyothorax without fistula: J86.9

## 2022-08-11 LAB — CBC
HCT: 31.7 % — ABNORMAL LOW (ref 36.0–46.0)
Hemoglobin: 9.9 g/dL — ABNORMAL LOW (ref 12.0–15.0)
MCH: 26 pg (ref 26.0–34.0)
MCHC: 31.2 g/dL (ref 30.0–36.0)
MCV: 83.2 fL (ref 80.0–100.0)
Platelets: 483 10*3/uL — ABNORMAL HIGH (ref 150–400)
RBC: 3.81 MIL/uL — ABNORMAL LOW (ref 3.87–5.11)
RDW: 16.2 % — ABNORMAL HIGH (ref 11.5–15.5)
WBC: 15.3 10*3/uL — ABNORMAL HIGH (ref 4.0–10.5)
nRBC: 0 % (ref 0.0–0.2)

## 2022-08-11 LAB — BASIC METABOLIC PANEL
Anion gap: 10 (ref 5–15)
BUN: 22 mg/dL (ref 8–23)
CO2: 25 mmol/L (ref 22–32)
Calcium: 7.2 mg/dL — ABNORMAL LOW (ref 8.9–10.3)
Chloride: 107 mmol/L (ref 98–111)
Creatinine, Ser: 0.64 mg/dL (ref 0.44–1.00)
GFR, Estimated: >60 mL/min (ref >60)
Glucose, Bld: 204 mg/dL — ABNORMAL HIGH (ref 70–99)
Potassium: 3.3 mmol/L — ABNORMAL LOW (ref 3.5–5.1)
Sodium: 142 mmol/L (ref 135–145)

## 2022-08-11 MED ORDER — POTASSIUM CHLORIDE CRYS ER 20 MEQ PO TBCR
40.0000 meq | EXTENDED_RELEASE_TABLET | Freq: Once | ORAL | Status: AC
  Administered 2022-08-11: 40 meq via ORAL
  Filled 2022-08-11: qty 2

## 2022-08-11 MED ORDER — MIDODRINE HCL 5 MG PO TABS
10.0000 mg | ORAL_TABLET | Freq: Two times a day (BID) | ORAL | Status: DC
  Administered 2022-08-11 – 2022-08-13 (×5): 10 mg via ORAL
  Filled 2022-08-11 (×5): qty 2

## 2022-08-11 MED ORDER — SODIUM CHLORIDE (PF) 0.9 % IJ SOLN
10.0000 mg | Freq: Once | INTRAMUSCULAR | Status: AC
  Administered 2022-08-11: 10 mg via INTRAPLEURAL
  Filled 2022-08-11: qty 10

## 2022-08-11 MED ORDER — ENSURE ENLIVE PO LIQD
237.0000 mL | Freq: Three times a day (TID) | ORAL | Status: DC
  Administered 2022-08-11 – 2022-08-18 (×18): 237 mL via ORAL

## 2022-08-11 MED ORDER — STERILE WATER FOR INJECTION IJ SOLN
5.0000 mg | Freq: Once | RESPIRATORY_TRACT | Status: AC
  Administered 2022-08-11: 5 mg via INTRAPLEURAL
  Filled 2022-08-11: qty 5

## 2022-08-11 MED ORDER — HYDROMORPHONE HCL 2 MG PO TABS
4.0000 mg | ORAL_TABLET | ORAL | Status: DC | PRN
  Administered 2022-08-11 – 2022-08-30 (×79): 4 mg via ORAL
  Filled 2022-08-11 (×79): qty 2

## 2022-08-11 NOTE — Progress Notes (Signed)
[name]   DOB:1961/09/23   QQ#:[id number]    ASSESSMENT & PLAN:   Metastatic breast cancer to the lung and bones While she responded very well to chemotherapy and antiestrogen therapy, she is not thriving due to progressive weight loss and weakness While hospitalized, I recommend we continue on antiestrogen therapy but hold abemaciclib Recent CT imaging showed continuous improvement in response to therapy despite being off abemaciclib   Recurrent malignant pleural effusion with MSSA empyema I would defer management to pulmonologist/ICU team  malignant cachexia Failure to thrive Severe protein calorie malnutrition I encouraged the patient to continue frequent small meals  Cancer associated pain She had recent confusion episode/altered mental status due to narcotics I recommend the patient to continue to request pain medicine as needed For now, I am not enthusiastic to raise the dose of her pain medicine   Generalized weakness Failure to thrive Multifactorial, related to progressive weight loss, muscle loss and deconditioning Recommend skilled nursing facility placement upon discharge   Code Status With a progressive decline in empyema, I reviewed the CODE STATUS with the patient twice on November 9 Ultimately, she agreed with changing her CODE STATUS to DO NOT RESUSCITATE   Goals of care Improvement of weakness, infection and nutrition status   Discharge planning Unknown at this point I will check on her again tomorrow All questions were answered. The patient knows to call the clinic with any problems, questions or concerns.   The total time spent in the appointment was 40 minutes encounter with patients including review of chart and various tests results, discussions about plan of care and coordination of care plan  Heath Lark, MD 08/11/2022 8:51 AM  Subjective:  I have reviewed daily progress note over the last few days as well as her imaging study.  I saw the  patient and reviewed plan of care with her mother The patient appears motivated, has been trying to eat as much as she can.  She felt that her pain is suboptimally controlled but has not been requesting pain medicine on a regular basis She denies constipation  Objective:  Vitals:   08/11/22 0534 08/11/22 0800  BP:    Pulse: 96   Resp: 18   Temp:  98.1 F (36.7 C)  SpO2: 100%      Intake/Output Summary (Last 24 hours) at 08/11/2022 0851 Last data filed at 08/11/2022 0445 Gross per 24 hour  Intake 3339.51 ml  Output 1725 ml  Net 1614.51 ml    GENERAL:alert, no distress and comfortable.  She looks thin and cachectic SKIN: skin color, texture, turgor are normal, no rashes or significant lesions EYES: normal, Conjunctiva are pink and non-injected, sclera clear OROPHARYNX:no exudate, no erythema and lips, buccal mucosa, and tongue normal  NECK: supple, thyroid normal size, non-tender, without nodularity LYMPH:  no palpable lymphadenopathy in the cervical, axillary or inguinal LUNGS: Reduced breath sounds on anterior lung HEART: Tachycardia, no murmurs and no lower extremity edema ABDOMEN:abdomen soft, non-tender and normal bowel sounds Musculoskeletal:no cyanosis of digits and no clubbing  NEURO: alert & oriented x 3 with fluent speech, no focal motor/sensory deficits   Labs:  Recent Labs    08/05/22 0458 08/06/22 0456 08/06/22 1549 08/07/22 0428 08/08/22 0500 08/09/22 1113 08/10/22 0522  NA 136 136  --  137 138 138 140  K 4.0 4.2  --  4.2 4.2 3.9 3.8  CL 103 102  --  105 104 106 107  CO2 25 28  --  28  $'29 29 28  'c$ GLUCOSE 129* 124*  --  135* 135* 153* 179*  BUN 33* 35*  --  30* 25* 22 23  CREATININE 1.85* 1.38*  --  1.08* 0.80 0.73 0.72  CALCIUM 7.1* 8.0*  --  7.7* 7.8* 7.4* 7.4*  GFRNONAA 30* 43*  --  58* >60 >60 >60  PROT 6.3* 6.0* 6.1* 6.0*  --   --   --   ALBUMIN 2.4* 2.3*  --  2.2* 2.1*  --   --   AST 22 18  --  20  --   --   --   ALT 23 21  --  20  --   --   --    ALKPHOS 50 49  --  49  --   --   --   BILITOT 0.3 0.4  --  0.3  --   --   --     Studies: I personally reviewed her CT imaging CT CHEST WO CONTRAST  Result Date: 08/10/2022 CLINICAL DATA:  Metastatic breast cancer.  Empyema. * Tracking Code: BO * EXAM: CT CHEST WITHOUT CONTRAST TECHNIQUE: Multidetector CT imaging of the chest was performed following the standard protocol without IV contrast. RADIATION DOSE REDUCTION: This exam was performed according to the departmental dose-optimization program which includes automated exposure control, adjustment of the mA and/or kV according to patient size and/or use of iterative reconstruction technique. COMPARISON:  06/25/2022 FINDINGS: Cardiovascular: Mild atherosclerotic calcification of the aortic arch. Mediastinum/Nodes: There is hazy diffuse edema/infiltration of the mediastinal and subcutaneous adipose tissues compatible with third spacing of fluid. Lungs/Pleura: Large loculated right hydropneumothorax. Pigtail right pleural drainage catheter in place. IDA. Made only about 25% of the right lung is aerated. There is scattered atelectasis in the right lung. Moderate to large left pleural effusion encompassing about 1/3 of the left hemithoracic volume. No definite mass or nodule in the aerated portion of the left lung. Upper Abdomen: Stable small hypodense hepatic lesions, compatible with cysts. Stable benign cyst in the left kidney upper pole, not requiring further imaging workup. Musculoskeletal: The CT scan from 06/25/2022 showed enhancing lesions in the right chest wall and axilla. These are substantially less conspicuous today although likely mainly from lack of IV contrast rather than resolution. A right axillary mass or lymph node measures 1.8 cm in short axis on image 52 series 2, formerly 1.9 cm. Bony destructive findings of the right fifth rib laterally as on image 62 series 5. Sclerotic focus possibly a healing fracture in the right eighth rib  laterally. Extensive sclerosis in the sternum favoring metastatic disease over infection. Nearly complete central collapse of the T3 vertebral body with associated sclerosis and minimal posterior bony retropulsion unchanged from prior. IMPRESSION: 1. Large loculated right hydropneumothorax encompassing about 75% of right hemithoracic volume. Pigtail right pleural drainage catheter in place. 2. Moderate to large left pleural effusion encompassing about 1/3 of the left hemithoracic volume. 3. Right axillary mass or lymph node measures 1.8 cm in short axis, formerly 1.9 cm. Other previously seen chest wall soft tissue lesions are less conspicuous on today's exam due to lack of IV contrast, but likely still present. 4. Bony destructive findings of the right fifth rib laterally compatible with metastatic disease. 5. Extensive sclerosis in the sternum favoring metastatic disease over infection. 6. Nearly complete central collapse of the T3 vertebral body with associated sclerosis and minimal posterior bony retropulsion, unchanged from prior. 7. Diffuse hazy edema/infiltration of the mediastinal and subcutaneous adipose tissues  compatible with third spacing of fluid. 8. Aortic atherosclerosis. Aortic Atherosclerosis (ICD10-I70.0). Electronically Signed   By: Van Clines M.D.   On: 08/10/2022 10:39   DG Chest Port 1 View  Result Date: 08/10/2022 CLINICAL DATA:  Pleural effusion. Follow-up. Acute respiratory failure. EXAM: PORTABLE CHEST 1 VIEW COMPARISON:  08/08/2022 FINDINGS: There is a right-sided pigtail thoracostomy tube overlying the medial right lower lung. Right-sided, locule hydropneumothorax is identified. No significant change in volume compared with the previous exam. Moderate left pleural effusion appears increased in volume. IMPRESSION: 1. No significant change in volume of right-sided hydropneumothorax. 2. Increase in volume of moderate left pleural effusion. 3. Stable position of right  thoracostomy tube. Electronically Signed   By: Kerby Moors M.D.   On: 08/10/2022 09:07   DG Chest Port 1 View  Result Date: 08/09/2022 CLINICAL DATA:  Acute respiratory failure, right pleural effusion EXAM: PORTABLE CHEST 1 VIEW COMPARISON:  08/08/2022 FINDINGS: Interval placement of right basilar chest tube. Decreasing right pleural effusion. Moderate right effusion remains. No pneumothorax. Extensive airspace disease throughout the right lung. Left perihilar and lower lobe airspace disease with layering left effusion. Cardiomegaly. No acute bony abnormality. IMPRESSION: Interval placement of right chest tube with decreasing right pleural effusion and slight improved aeration within the right lung. Continued diffuse airspace disease on the right and in the left lower lung. Bilateral effusions. No pneumothorax. Electronically Signed   By: Rolm Baptise M.D.   On: 08/09/2022 08:12   CT PERC PLEURAL DRAIN W/INDWELL CATH W/IMG GUIDE  Result Date: 08/08/2022 INDICATION: 61 year old woman with right empyema presents to IR for chest tube placement EXAM: CT-guided right chest tube placement TECHNIQUE: Multidetector CT imaging of the chest was performed following the standard protocol without IV contrast. RADIATION DOSE REDUCTION: This exam was performed according to the departmental dose-optimization program which includes automated exposure control, adjustment of the mA and/or kV according to patient size and/or use of iterative reconstruction technique. MEDICATIONS: The patient is currently admitted to the hospital and receiving intravenous antibiotics. The antibiotics were administered within an appropriate time frame prior to the initiation of the procedure. ANESTHESIA/SEDATION: Moderate (conscious) sedation was employed during this procedure. A total of Versed 2 mg and Fentanyl 100 mcg was administered intravenously by the radiology nurse. Total intra-service moderate Sedation Time: 14 minutes. The  patient's level of consciousness and vital signs were monitored continuously by radiology nursing throughout the procedure under my direct supervision. COMPLICATIONS: None immediate. PROCEDURE: Informed written consent was obtained from the patient after a thorough discussion of the procedural risks, benefits and alternatives. All questions were addressed. Maximal Sterile Barrier Technique was utilized including caps, mask, sterile gowns, sterile gloves, sterile drape, hand hygiene and skin antiseptic. A timeout was performed prior to the initiation of the procedure. Patient positioned left lateral decubitus on the procedure table. The right lateral chest wall skin prepped and draped in usual fashion. Following local lidocaine administration, right pleural space was accessed with 19 gauge Yueh needle utilizing CT guidance. Yueh catheter removed over 0.035 inch Amplatz guidewire, serial dilation performed, and 14 [place] multipurpose pigtail drain inserted. CT confirmed appropriate positioning of the drain within the right pleural space. The drain was connected to Pleur-Evac and secured to skin with suture. IMPRESSION: CT-guided right chest tube (14 [place]) as above. Electronically Signed   By: Miachel Roux M.D.   On: 08/08/2022 17:02   DG Chest Port 1 View  Result Date: 08/08/2022 CLINICAL DATA:  Follow-up pleural effusion. EXAM: PORTABLE CHEST  1 VIEW COMPARISON:  08/07/2022 FINDINGS: Exam detail is diminished due to patient positioning with significant rightward rotational artifact. The cardiomediastinal contours appear unchanged. There is a large right pleural effusion which is unchanged in volume compared with the previous exam. There is significantly diminished aeration to the right lung with only a small amount of aerated lung in the right upper lobe. Small left pleural effusion is unchanged. IMPRESSION: 1. No change in large right pleural effusion with significantly diminished aeration to the right lung.  Electronically Signed   By: Kerby Moors M.D.   On: 08/08/2022 07:05   DG Chest Port 1 View  Result Date: 08/07/2022 CLINICAL DATA:  Pleural effusion EXAM: PORTABLE CHEST 1 VIEW COMPARISON:  08/06/2022 FINDINGS: No significant change in rotated AP portable examination. Large right pleural effusion with near total atelectasis or consolidation of the right lung, as well as a small, layering left pleural effusion. Cardiomegaly, the cardiac borders largely obscured. IMPRESSION: No significant change in rotated AP portable examination. Large right pleural effusion with near total atelectasis or consolidation of the right lung, as well as a small, layering left pleural effusion. Electronically Signed   By: Delanna Ahmadi M.D.   On: 08/07/2022 09:17   DG Chest Port 1 View  Result Date: 08/06/2022 CLINICAL DATA:  S/P thoracentesis EXAM: PORTABLE CHEST 1 VIEW COMPARISON:  Same day chest x-ray. FINDINGS: Decreased but still large right pleural effusion. Small left pleural effusion. Bilateral overlying opacities. No visible pneumothorax on this semi erect radiograph. Cardiomediastinal silhouette is similar. Polyarticular degenerative change. IMPRESSION: 1. Decreased but still large right pleural effusion. No visible pneumothorax on this semi erect radiograph. 2. Small left pleural effusion 3. Bilateral overlying opacities. Electronically Signed   By: Margaretha Sheffield M.D.   On: 08/06/2022 15:50   DG Chest 1 View  Result Date: 08/06/2022 CLINICAL DATA:  Respiratory failure. EXAM: CHEST  1 VIEW COMPARISON:  Multiple recent chest x-rays. FINDINGS: Persistent complete opacification of the right hemithorax due to a large pleural effusion. Slight shift of the heart and mediastinum to the left exaggerated by the rotation of the patient and thoracic scoliosis. Persistent left lower lobe atelectasis but no left pleural effusion. IMPRESSION: Persistent complete opacification of the right hemithorax. Electronically Signed    By: Marijo Sanes M.D.   On: 08/06/2022 11:48   DG Chest Port 1 View  Result Date: 08/05/2022 CLINICAL DATA:  Shortness of breath EXAM: PORTABLE CHEST 1 VIEW COMPARISON:  08/05/2022, 06/25/2022, 08/03/2022 FINDINGS: Complete opacification of right thorax without change. Subsegmental atelectasis left base. Partially obscured cardiomediastinal silhouette. IMPRESSION: Complete opacification of the right thorax without change, probably due to sizable pleural effusion. Subsegmental atelectasis left base Electronically Signed   By: Donavan Foil M.D.   On: 08/05/2022 23:33   ECHOCARDIOGRAM COMPLETE  Result Date: 08/05/2022    ECHOCARDIOGRAM REPORT   Patient Name:   [name] Date of Exam: 08/05/2022 Medical Rec #:  [id number]       Height:       66.0 in Accession #:    [id number]      Weight:       118.6 lb Date of Birth:  1961-05-07        BSA:          1.602 m Patient Age:    61 years        BP:           97/44 mmHg Patient Gender: F  HR:           78 bpm. Exam Location:  Inpatient Procedure: 2D Echo, Cardiac Doppler and Color Doppler Indications:    R94.31 Abnormal EKG  History:        Patient has no prior history of Echocardiogram examinations.                 Risk Factors:Hypertension. Metastatic breast cancer.  Sonographer:    [place] Referring Phys: [id number] AMRIT ADHIKARI  Sonographer Comments: Technically difficult study due to poor echo windows, suboptimal apical window and suboptimal subcostal window. Apical images off axis due to the hardened texture of the left breast. Could not turn patient. Wound dressings in subcostal region. IMPRESSIONS  1. Left ventricular ejection fraction, by estimation, is 55 to 60%. The left ventricle has normal function. The left ventricle has no regional wall motion abnormalities. Left ventricular diastolic parameters were normal.  2. Right ventricular systolic function is normal. The right ventricular size is normal. Mildly increased right ventricular  wall thickness. There is mildly elevated pulmonary artery systolic pressure. The estimated right ventricular systolic pressure is 35.0 mmHg.  3. Left atrial size was mildly dilated.  4. A small pericardial effusion is present. The pericardial effusion is circumferential. There is no evidence of cardiac tamponade. Large pleural effusion in the right lateral region.  5. 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 Tricuspid valve regurgitation is mild to moderate.  7. The aortic valve is tricuspid. Aortic valve regurgitation is not visualized. No aortic stenosis is present.  8. The inferior vena cava is normal in size with greater than 50% respiratory variability, suggesting right atrial pressure of 3 mmHg. FINDINGS  Left Ventricle: Left ventricular ejection fraction, by estimation, is 55 to 60%. The left ventricle has normal function. The left ventricle has no regional wall motion abnormalities. The left ventricular internal cavity size was normal in size. There is  no left ventricular hypertrophy. Left ventricular diastolic parameters were normal. Right Ventricle: The right ventricular size is normal. Mildly increased right ventricular wall thickness. Right ventricular systolic function is normal. There is mildly elevated pulmonary artery systolic pressure. The tricuspid regurgitant velocity is 3.19 m/s, and with an assumed right atrial pressure of 3 mmHg, the estimated right ventricular systolic pressure is 09.3 mmHg. Left Atrium: Left atrial size was mildly dilated. Right Atrium: Right atrial size was normal in size. Pericardium: Complex septated fluid collection in the right pleural cavity, consider empyema or malignant effusion. A small pericardial effusion is present. The pericardial effusion is circumferential. There is no evidence of cardiac tamponade. Mitral Valve: The mitral  valve is myxomatous. 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 There is mild thickening of the mitral valve leaflet(s). Mild mitral valve regurgitation, with centrally-directed jet. Tricuspid Valve: The tricuspid valve is myxomatous. Tricuspid valve regurgitation is mild to moderate. Aortic Valve: The aortic valve is tricuspid. Aortic valve regurgitation is not visualized. No aortic stenosis is present. Pulmonic Valve: The pulmonic valve was normal in structure. Pulmonic valve regurgitation is mild. Aorta: The aortic root and ascending aorta are structurally normal, with no evidence of dilitation. Venous: IVC assessment for right atrial pressure unable to be performed due to mechanical ventilation. The inferior vena cava is normal in size with greater than 50% respiratory variability, suggesting right atrial pressure of 3 mmHg.  IAS/Shunts: No atrial level shunt detected by color flow Doppler. Additional Comments: There is a large pleural effusion in the right lateral region.  LEFT VENTRICLE PLAX 2D LVIDd:         4.43 cm     Diastology LVIDs:         2.80 cm     LV e' medial:    5.08 cm/s LV PW:         1.03 cm     LV E/e' medial:  10.3 LV IVS:        0.87 cm     LV e' lateral:   6.46 cm/s LVOT diam:     2.10 cm     LV E/e' lateral: 8.1 LV SV:         44 LV SV Index:   28 LVOT Area:     3.46 cm  LV Volumes (MOD) LV vol d, MOD A4C: 57.5 ml LV vol s, MOD A4C: 18.7 ml LV SV MOD A4C:     57.5 ml RIGHT VENTRICLE             IVC RV S prime:     13.10 cm/s  IVC diam: 1.10 cm TAPSE (M-mode): 1.7 cm LEFT ATRIUM           Index        RIGHT ATRIUM           Index LA diam:      3.20 cm 2.00 cm/m   RA Area:     14.20 cm LA Vol (A4C): 57.5 ml 35.90 ml/m  RA Volume:   35.90 ml  22.42 ml/m  AORTIC VALVE             PULMONIC VALVE LVOT Vmax:   75.15 cm/s  PR End Diast Vel: 2.14 msec LVOT Vmean:  45.850 cm/s LVOT VTI:    0.127 m  AORTA Ao Root diam: 2.60 cm Ao Asc diam:  2.70 cm MITRAL VALVE                TRICUSPID VALVE MV Area (PHT): 4.89 cm    TR Peak grad:   40.7 mmHg MV Decel Time: 155 msec    TR Vmax:        319.00 cm/s MV E velocity: 52.40 cm/s MV A velocity: 50.00 cm/s  SHUNTS MV E/A ratio:  1.05        Systemic VTI:  0.13 m                            Systemic Diam: 2.10 cm Dani Gobble Croitoru MD Electronically signed by Sanda Klein MD Signature Date/Time: 08/05/2022/12:38:14 PM    Final    DG CHEST PORT 1 VIEW  Result Date: 08/05/2022 CLINICAL DATA:  Right pleural effusion. EXAM: PORTABLE CHEST 1 VIEW COMPARISON:  08/05/2022 FINDINGS: Stable cardiomediastinal contours. Left lung is clear. Complete opacification of the right hemithorax is again noted and appears unchanged. Findings are favored to represent known large right pleural effusion. IMPRESSION: 1. No change in complete opacification of the right hemithorax which is presumed to represent large right pleural effusion. Electronically Signed   By: Kerby Moors M.D.   On: 08/05/2022 10:06   DG CHEST PORT 1 VIEW  Result Date: 08/05/2022 CLINICAL DATA:  Respiratory compromise EXAM: PORTABLE CHEST 1 VIEW COMPARISON:  08/03/2022 FINDINGS: Complete opacification of the right hemithorax, presumably reflecting a large pleural effusion. Left lung is clear. No  pneumothorax. The heart is normal in size. IMPRESSION: Complete opacification of the right hemithorax, presumably reflecting a large pleural effusion. Electronically Signed   By: Julian Hy M.D.   On: 08/05/2022 00:41   US THORACENTESIS ASP PLEURAL SPACE W/IMG GUIDE  Result Date: 08/03/2022 INDICATION: Patient with history of right-sided breast cancer, right pleural effusion. Request is made for diagnostic and therapeutic thoracentesis. EXAM: ULTRASOUND GUIDED RIGHT THORACENTESIS MEDICATIONS: 10 mL 1% lidocaine COMPLICATIONS: None immediate. PROCEDURE: An ultrasound guided thoracentesis was thoroughly discussed with the patient and questions answered. The benefits, risks,  alternatives and complications were also discussed. The patient understands and wishes to proceed with the procedure. Written consent was obtained. Ultrasound was performed to localize and mark an adequate pocket of fluid in the right chest. The area was then prepped and draped in the normal sterile fashion. 1% Lidocaine was used for local anesthesia. Under ultrasound guidance a 6 Fr Safe-T-Centesis catheter was introduced. Thoracentesis was performed. The catheter was removed and a dressing applied. FINDINGS: A total of approximately 800 mL of yellow fluid was removed. Samples were sent to the laboratory as requested by the clinical team. IMPRESSION: Successful ultrasound guided right thoracentesis yielding 800 mL of pleural fluid. Read by: Brynda Greathouse PA-C Electronically Signed   By: Sandi Mariscal M.D.   On: 08/03/2022 13:06   DG Chest Port 1 View  Result Date: 08/03/2022 CLINICAL DATA:  Post right thoracentesis EXAM: PORTABLE CHEST 1 VIEW COMPARISON:  08/02/2022 FINDINGS: Continued complete opacification of the right hemithorax. No visible pneumothorax. No confluent opacity or effusion on the left. Heart is normal size. IMPRESSION: Continued complete opacification of the right hemithorax. No visible pneumothorax. Electronically Signed   By: Rolm Baptise M.D.   On: 08/03/2022 11:52   DG Chest Portable 1 View  Result Date: 08/02/2022 CLINICAL DATA:  Weakness and breast cancer.  Dehydration.  Fatigue. EXAM: PORTABLE CHEST 1 VIEW COMPARISON:  Chest CT 06/25/2022 FINDINGS: Increased right pleural effusion with complete opacification of the right hemithorax. There is leftward shift of the trachea and mediastinal structures. No aerated right lung is seen. No focal airspace disease in the left lung. Left lung nodules on prior CT are not seen by radiograph. No visible pneumothorax. Osseous metastatic disease on CT not well demonstrated. IMPRESSION: Increased right pleural effusion with complete opacification of  the right hemithorax. No aerated right lung is seen. Leftward shift of the trachea and mediastinal structures. Electronically Signed   By: Keith Rake M.D.   On: 08/02/2022 16:53

## 2022-08-11 NOTE — Progress Notes (Signed)
Follow up visit to check on patient. She was awake and receptive, we shared in conversation. Will continue to follow up.

## 2022-08-11 NOTE — TOC Progression Note (Signed)
Transition of Care [facility]) - Progression Note    Patient Details  Name: [name] MRN: [id number] Date of Birth: Apr 21, 1961  Transition of Care [facility]) CM/SW Contact  Jona Erkkila, Juliann Pulse, RN Phone Number: 08/11/2022, 10:08 AM  Clinical Narrative: Noted chest tube.DNR.Oncology following-progressive decline.PMT signed off. PT on hold. Currently not appropriate for ST SNF.Await referrals to asst for d/c plans.      Expected Discharge Plan:  (TBD) Barriers to Discharge: Continued Medical Work up  Expected Discharge Plan and Services Expected Discharge Plan:  (TBD)   Discharge Planning Services: CM Consult   Living arrangements for the past 2 months: Apartment                                       Social Determinants of Health (SDOH) Interventions    Readmission Risk Interventions    08/06/2022   10:01 AM  Readmission Risk Prevention Plan  Medication Review (RN Care Manager) Complete  PCP or Specialist appointment within 3-5 days of discharge Complete  HRI or Home Care Consult Complete  SW Recovery Care/Counseling Consult Complete  Palliative Care Screening Not [place] Complete

## 2022-08-11 NOTE — Procedures (Addendum)
Pleural Fibrinolytic Administration Procedure Note  [name]  [id number]  [birth year]  Date:08/11/22  Time:4:59 PM   Provider Performing:Benetta Maclaren Alfredo Martinez   Procedure: Pleural Fibrinolysis Subsequent day [phone])  Indication(s) Fibrinolysis of complicated pleural effusion  Consent Risks of the procedure as well as the alternatives and risks of each were explained to the patient and/or caregiver.  Consent for the procedure was obtained. Mother at bedside for consent process.    Anesthesia None   Time Out Verified patient identification, verified procedure, site/side was marked, verified correct patient position, special equipment/implants available, medications/allergies/relevant history reviewed, required imaging and test results available.   Sterile Technique Hand hygiene, gloves   Procedure Description Existing pleural catheter was cleaned and accessed in sterile manner.  '10mg'$  of tPA in 30cc of saline and '5mg'$  of dornase in 30cc of sterile water were injected into pleural space using existing pleural catheter.  Catheter will be clamped for 1 hour and then placed back to suction.  Medication list reviewed.   Complications/Tolerance None; patient tolerated the procedure well.  EBL None   Specimen(s) None   [name], MSN, APRN, NP-C, AGACNP-BC [facility] 08/11/2022, 4:59 PM   Please see [email or web address] for pager details.   From 7A-7P if no response, please call [phone] After hours, please call ELink [phone]

## 2022-08-11 NOTE — Progress Notes (Addendum)
NAME:  [name], MRN:  [id number], DOB:  1960-10-19, LOS: 7 ADMISSION DATE:  08/02/2022, CONSULTATION DATE:  08/05/22 REFERRING MD:  Gershon Cull , CHIEF COMPLAINT:   Lethargy, ams   BRIEF  61 year old with metastatic breast cancer, hypertension with recurrent right-sided malignant effusion presenting with decreased appetite, weakness and lethargy.  She underwent thoracentesis on admission showing MSSA empyema.  She underwent emergent thoracentesis 11/8.  Required vasopressors, weaned off.   Pertinent  Medical History  HTN Breast cancer - R chest wall, contralateral breast (L. Mets to lung and bones.  Responding well to chemo/antiestrogen.   Failutre to thrive, weight loss, malignant cachexia  Malignant pleural effusion R   Home meds: abemaciclib, arimidex, calcium carbonate, vit D, Dilaudid '8mg'$  q6 prn  Mag oxide, mirtazapine 7.'5mg'$  qhs  Miralax, senna  Flagyl gel   Significant Hospital Events: Including procedures, antibiotic start and stop dates in addition to other pertinent events   03/23/2022: Right-sided thoracentesis: LDH 259.  White cells 207.  8% polymorphs. Malignant cells consistent with breast cancer present xxxxxxxxxxxxxxxxxx 08/02/2022 - admit MRSA PCR neg 08/03/2022: Right thoracentesis 800 mL and stopped due to pain LDH 439, white cell 7100/84% polymorphs > huge increase in white count and left shift. MSSA gorwing from pleural fluids 08/03/22 08/05/22 -PCCM consult and ICU tx Left groin CVL Left groin a-line  08/05/22 -last Narcan was at 4 AM.  Now completely awake on BiPAP.  Wants BiPAP off.  No respiratory distress.  Remains on vasopressors Levophed 9 mcg and vasopressin.  Diastolic is low at 40 with map of 71.  She has left femoral arterial line and central line.  She is oriented and communicating.  She remains afebrile.  White count is high.  She is on antibiotics vancomycin and cefepime. ECHO - ef65%. RSVP 45+., Mild to mod TR. Mild MR Family not in favor for  pleurx. Dr Alvy Bimler recommending 1 more thora 08/06/22 worsening effusion with ?tension physiology s/p emergent thora. Repeat pleural Cx showing staph 11/10 after extensive patient with family informed decision made for right pigtail catheter by IR 11/12 CT drainage 1070. S/p tPA, dornase  Interim History / Subjective:  Afebrile On RA CT drainage 1.4L in last 24 hours  PT reports pain is interfering with her ability to eat  Objective   Blood pressure 134/84, pulse 96, temperature 98.4 F (36.9 C), temperature source Oral, resp. rate 18, height '5\' 6"'$  (1.676 m), weight 60.5 kg, SpO2 100 %.        Intake/Output Summary (Last 24 hours) at 08/11/2022 0744 Last data filed at 08/11/2022 0445 Gross per 24 hour  Intake 3579.51 ml  Output 1725 ml  Net 1854.51 ml   Filed Weights   08/08/22 0500 08/10/22 0500 08/11/22 0419  Weight: 57.4 kg 63.6 kg 60.5 kg    Exam: General: pleasant adult female lying in bed in NAD HEENT: MM pink/moist, fair dentition, pupils =/reactive  Neuro: AAOx4, speech clear, MAE / generalized weakness CV: s1s2 RRR, ST on monitor, no m/r/g PULM: tachypnea but non-labored at rest, diminished breath sounds with rhonchi on right, diminished on left GI: soft, bsx4 active  Extremities: warm/dry, 1+ BLE edema, chronic RUE edema  Skin: right chest wall chronic malignant wound dressing intact, right sided posterior chest tube with serosanguinous drainage  Resolved Hospital Problem list    Malignant Right Pleural Effusion  MSSA Empyema -chest tube care per protocol, flush Q8 -follow chest tube output  Septic Shock, MSSA Empyema, (POA) S/p thora 11/5, 11/8.  Right chest tube placed by IR on 11/10. Off pressors, lines removed.  -continue cefazolin for MSSA  -reduce midodrine to BID dosing   -monitor chest tube drainage  -continue hydrocortisone 50 mg IV Q6 for now, ensure tolerance to midodrine reduction then wean off  Paroxysmal Atrial Flutter on admission NSR  11/13 -tele monitoring   Chronic Cancer Related Pain -on opioids at home Chronic Depression on Remeron PTA   -supportive care -PRN pain medications  -consider long acting scheduled pain meds   Severe cachexia, severe protein calorie malnutrition present on admission Dietitian on board.  Nutrition supplements -increase ensure to TID  -continue megace  -bowel regimen with narcotics  -encourage PO intake   Chronic Anemia secondary to Critical Illness, Malignancy -trend CBC -transfuse for Hgb <7%  Metastatic Breast Cancer -per Dr. Alvy Bimler -continue antiestrogen therapy  -abemaciclib on hold  Goals of Care -DNR after discussion with Dr. Alvy Bimler  -Palliative Care signed off  -guarded prognosis with co-morbidities, poor nutrition    Best Practice (right click and "Reselect all SmartList Selections" daily)  Diet/type: Regular consistency (see orders)  DVT prophylaxis: other GI prophylaxis: PEPCID oral Lines: N/A  Foley:  PUREWICK Code Status:  DNR  TRH Primary. PCCM will follow for chest tube management. SDU status.   Signature:   [name], MSN, APRN, NP-C, AGACNP-BC [facility] 08/11/2022, 7:44 AM   Please see [email or web address] for pager details.   From 7A-7P if no response, please call [phone] After hours, please call ELink [phone]

## 2022-08-11 NOTE — Progress Notes (Signed)
PROGRESS NOTE    [name]  AJG:[id number] DOB: Oct 14, 1960 DOA: 08/02/2022 PCP: Heath Lark, MD    Brief Narrative:  61 year old female with metastatic breast cancer, hypertension, recurrent right-sided malignant pleural effusion who was initially admitted with decreased appetite, weakness and lethargy.  Initially underwent thoracentesis on admission that was growing MSSA.  Emergent thoracentesis 11/8.  11/4-admit to the hospital. 11/5-right-sided thoracentesis 800 mL removed, LDH 439, white cell 84%.  MSSA from the pleural fluid. 11/7-PCCM consulted and transferred to ICU, started on vasopressors with left groin central line.  Treated with BiPAP. 11/8-worsening pleural effusion, emergent thoracentesis done persistent staff. 11/10-pigtail catheter was placed and currently getting Lasix through the tube. 11/12 transfer to [facility] service.  Not needing BiPAP now.  Remains on pigtail catheter.  Remains on antibiotics.    Assessment & Plan:   Recurrent malignant pleural effusion on the right side with MSSA empyema: Septic shock secondary to MSSA empyema present on admission, resolving. Respiratory distress secondary to right-sided empyema, resolving.  Currently remains on cefazolin for MSSA, still remains on hydrocortisone and midodrine.  Levophed is off. Blood cultures negative so far. Fluid culture with MSSA, currently on cefazolin.  Continue. Currently has a pigtail catheter in place, repeat CT scan shows large loculated pleural effusion.  Patient is likely not a candidate for VATS.  PCCM continues to follow-up.  Transient A-fib/flutter on admission: Stable now.  Chronic cancer related pain: On home opiate doses and also as needed Dilaudid.  Patient remains on Remeron. Pain is not controlled.  Will change Dilaudid 4 mg oral to every 4 hours to avoid using injectable Dilaudid.  Noted episode of unresponsiveness in the past, however her pain is not controlled.  Metastatic breast  cancer on antiestrogen therapy.  Followed by her oncologist. Chronic anemia secondary to critical illness Severe protein calorie malnutrition Cancer cachexia Severe frailty and debility  Continue supportive care.  Oncology discussing goal of care.  Currently remains DNR.   DVT prophylaxis: enoxaparin (LOVENOX) injection 40 mg Start: 08/06/22 2200 Place and maintain sequential compression device Start: 08/06/22 1943   Code Status: DNR Family Communication: None Disposition Plan: Status is: Inpatient Remains inpatient appropriate because: Severely ill on IV antibiotics and procedures     Consultants:  Critical care Palliative care Oncology  Procedures:  Multiple thoracentesis, pigtail catheter  Antimicrobials:  Cefazolin 11/8---   Subjective: Patient seen and examined.  Pleasant.  No overnight events.  Still unable to take deep breaths on the right side.  Patient tells me that she wants to try to manage her pain with oral pain medications and was asking whether she developed tolerance to the medications.  Remains afebrile.  On room air.  Objective: Vitals:   08/11/22 0419 08/11/22 0500 08/11/22 0513 08/11/22 0534  BP:  134/84    Pulse:  99 97 96  Resp:   20 18  Temp:      TempSrc:      SpO2:  100% 100% 100%  Weight: 60.5 kg     Height:        Intake/Output Summary (Last 24 hours) at 08/11/2022 0733 Last data filed at 08/11/2022 0445 Gross per 24 hour  Intake 3579.51 ml  Output 1725 ml  Net 1854.51 ml   Filed Weights   08/08/22 0500 08/10/22 0500 08/11/22 0419  Weight: 57.4 kg 63.6 kg 60.5 kg    Examination:  General exam: Appears calm and comfortable  Frail and debilitated.  Chronically sick looking. Respiratory system: On room air.  Not in obvious distress.  Poor air entry on the right side. Chest tube with serous sanguinous drainage.  Free-flowing. Cardiovascular system: S1 & S2 heard, RRR.  Gastrointestinal system: Soft.  Nondistended.  Bowel sound  present.   Central nervous system: Alert and oriented. No focal neurological deficits. Extremities: Symmetric 5 x 5 power. Skin: No rashes, lesions or ulcers Psychiatry: Judgement and insight appear normal. Mood & affect appropriate.     Data Reviewed: I have personally reviewed following labs and imaging studies  CBC: Recent Labs  Lab 08/06/22 0456 08/07/22 0428 08/08/22 0500 08/09/22 1113 08/10/22 0522  WBC 13.7* 12.6* 13.8* 11.6* 12.1*  HGB 8.0* 8.0* 7.9* 8.5* 9.0*  HCT 26.1* 25.7* 25.3* 27.1* 28.0*  MCV 85.0 84.5 83.2 83.1 81.9  PLT 565* 528* 532* 498* 676*   Basic Metabolic Panel: Recent Labs  Lab 08/05/22 0458 08/06/22 0456 08/07/22 0428 08/08/22 0500 08/09/22 1113 08/10/22 0522  NA 136 136 137 138 138 140  K 4.0 4.2 4.2 4.2 3.9 3.8  CL 103 102 105 104 106 107  CO2 '25 28 28 29 29 28  '$ GLUCOSE 129* 124* 135* 135* 153* 179*  BUN 33* 35* 30* 25* 22 23  CREATININE 1.85* 1.38* 1.08* 0.80 0.73 0.72  CALCIUM 7.1* 8.0* 7.7* 7.8* 7.4* 7.4*  MG 2.4 2.4  --   --   --   --   PHOS  --  3.1  --  1.5*  --   --    GFR: Estimated Creatinine Clearance: 68.3 mL/min (by C-G formula based on SCr of 0.72 mg/dL). Liver Function Tests: Recent Labs  Lab 08/05/22 0458 08/06/22 0456 08/06/22 1549 08/07/22 0428 08/08/22 0500  AST 22 18  --  20  --   ALT 23 21  --  20  --   ALKPHOS 50 49  --  49  --   BILITOT 0.3 0.4  --  0.3  --   PROT 6.3* 6.0* 6.1* 6.0*  --   ALBUMIN 2.4* 2.3*  --  2.2* 2.1*   No results for input(s): "LIPASE", "AMYLASE" in the last 168 hours. No results for input(s): "AMMONIA" in the last 168 hours. Coagulation Profile: Recent Labs  Lab 08/05/22 0458 08/06/22 0456  INR 1.4* 1.3*   Cardiac Enzymes: No results for input(s): "CKTOTAL", "CKMB", "CKMBINDEX", "TROPONINI" in the last 168 hours. BNP (last 3 results) No results for input(s): "PROBNP" in the last 8760 hours. HbA1C: No results for input(s): "HGBA1C" in the last 72 hours. CBG: Recent Labs   Lab 08/04/22 2322  GLUCAP 124*   Lipid Profile: No results for input(s): "CHOL", "HDL", "LDLCALC", "TRIG", "CHOLHDL", "LDLDIRECT" in the last 72 hours. Thyroid Function Tests: No results for input(s): "TSH", "T4TOTAL", "FREET4", "T3FREE", "THYROIDAB" in the last 72 hours. Anemia Panel: No results for input(s): "VITAMINB12", "FOLATE", "FERRITIN", "TIBC", "IRON", "RETICCTPCT" in the last 72 hours. Sepsis Labs: Recent Labs  Lab 08/05/22 0458 08/05/22 0828 08/06/22 0456 08/07/22 0428 08/08/22 0500  PROCALCITON 0.97  --  0.81 0.41 0.18  LATICACIDVEN  --  0.6 0.7 1.1  --     Recent Results (from the past 240 hour(s))  SARS Coronavirus 2 by RT PCR (hospital order, performed in [facility] hospital lab) *cepheid single result test* Anterior Nasal Swab     Status: None   Collection Time: 08/02/22  5:51 PM   Specimen: Anterior Nasal Swab  Result Value Ref [place] Status   SARS Coronavirus 2 by RT PCR NEGATIVE NEGATIVE Final  Comment: (NOTE) SARS-CoV-2 target nucleic acids are NOT DETECTED.  The SARS-CoV-2 RNA is generally detectable in upper and lower respiratory specimens during the acute phase of infection. The lowest concentration of SARS-CoV-2 viral copies this assay can detect is 250 copies / mL. A negative result does not preclude SARS-CoV-2 infection and should not be used as the sole basis for treatment or other patient management decisions.  A negative result may occur with improper specimen collection / handling, submission of specimen other than nasopharyngeal swab, presence of viral mutation(s) within the areas targeted by this assay, and inadequate number of viral copies (<250 copies / mL). A negative result must be combined with clinical observations, patient history, and epidemiological information.  Fact Sheet for Patients:   https://www.patel.info/  Fact Sheet for Healthcare Providers: https://hall.com/  This test is  not yet approved or  cleared by the [place] FDA and has been authorized for detection and/or diagnosis of SARS-CoV-2 by FDA under an Emergency Use Authorization (EUA).  This EUA will remain in effect (meaning this test can be used) for the duration of the COVID-19 declaration under Section 564(b)(1) of the Act, 21 U.S.C. section 360bbb-3(b)(1), unless the authorization is terminated or revoked sooner.  Performed at [facility], [place] [address], [place], [place] [address]   Gram stain     Status: None   Collection Time: 08/03/22 12:13 PM   Specimen: PATH Cytology Pleural fluid  Result Value Ref [place] Status   Specimen Description   Final    FLUID Performed at [place] [address], [place], [place] [address]    Special Requests   Final    NONE Performed at [facility], [place] [address], [place], [place] [address]    Gram Stain   Final    NO ORGANISMS SEEN FEW WBC PRESENT, PREDOMINANTLY PMN Performed at [place] [address], [place], [place] [address]    Report Status 08/03/2022 FINAL  Final  Culture, body fluid w Gram Stain-bottle     Status: Abnormal   Collection Time: 08/03/22  6:05 PM   Specimen: Pleura  Result Value Ref [place] Status   Specimen Description PLEURAL FLUID  Final   Special Requests   Final    BOTTLES DRAWN AEROBIC AND ANAEROBIC Blood Culture results may not be optimal due to an excessive volume of blood received in culture bottles   Gram Stain   Final    ABUNDANT GRAM POSITIVE COCCI IN CLUSTERS RARE WBC PRESENT, PREDOMINANTLY PMN IN BOTH AEROBIC AND ANAEROBIC BOTTLES Performed at [facility], [place] [address] [place], [place] [address]    Culture STAPHYLOCOCCUS AUREUS (A)  Final   Report Status 08/06/2022 FINAL  Final   Organism ID, Bacteria STAPHYLOCOCCUS AUREUS  Final      Susceptibility   Staphylococcus aureus - MIC*    CIPROFLOXACIN 1 SENSITIVE Sensitive      ERYTHROMYCIN >=8 RESISTANT Resistant     GENTAMICIN <=0.5 SENSITIVE Sensitive     OXACILLIN 0.5 SENSITIVE Sensitive     TETRACYCLINE <=1 SENSITIVE Sensitive     VANCOMYCIN 1 SENSITIVE Sensitive     TRIMETH/SULFA <=10 SENSITIVE Sensitive     CLINDAMYCIN <=0.25 SENSITIVE Sensitive     RIFAMPIN <=0.5 SENSITIVE Sensitive     Inducible Clindamycin NEGATIVE Sensitive     * STAPHYLOCOCCUS AUREUS  MRSA Next Gen by PCR, Nasal     Status: None   Collection Time: 08/04/22 10:38 PM  Specimen: Nasal Mucosa; Nasal Swab  Result Value Ref [place] Status   MRSA by PCR Next Gen NOT DETECTED NOT DETECTED Final    Comment: (NOTE) The GeneXpert MRSA Assay (FDA approved for NASAL specimens only), is one component of a comprehensive MRSA colonization surveillance program. It is not intended to diagnose MRSA infection nor to guide or monitor treatment for MRSA infections. Test performance is not FDA approved in patients less than 68 years old. Performed at [facility], [place] [address] [place], [place] [address]   Body fluid culture w Gram Stain     Status: None   Collection Time: 08/06/22 12:31 PM   Specimen: Pleural Fluid  Result Value Ref [place] Status   Specimen Description   Final    PLEURAL Performed at [place] [name] [place], [place] [address]    Special Requests   Final    NONE Performed at [facility], [place] [address], [place], [place] [address]    Gram Stain   Final    FEW WBC PRESENT, PREDOMINANTLY PMN NO ORGANISMS SEEN Performed at [facility], [place] [address], [place], [place] [address]    Culture RARE STAPHYLOCOCCUS AUREUS  Final   Report Status 08/09/2022 FINAL  Final   Organism ID, Bacteria STAPHYLOCOCCUS AUREUS  Final      Susceptibility   Staphylococcus aureus - MIC*    CIPROFLOXACIN 1 SENSITIVE Sensitive     ERYTHROMYCIN >=8 RESISTANT Resistant     GENTAMICIN <=0.5 SENSITIVE Sensitive     OXACILLIN  0.5 SENSITIVE Sensitive     TETRACYCLINE <=1 SENSITIVE Sensitive     VANCOMYCIN <=0.5 SENSITIVE Sensitive     TRIMETH/SULFA <=10 SENSITIVE Sensitive     CLINDAMYCIN <=0.25 SENSITIVE Sensitive     RIFAMPIN <=0.5 SENSITIVE Sensitive     Inducible Clindamycin NEGATIVE Sensitive     * RARE STAPHYLOCOCCUS AUREUS         Radiology Studies: CT CHEST WO CONTRAST  Result Date: 08/10/2022 CLINICAL DATA:  Metastatic breast cancer.  Empyema. * Tracking Code: BO * EXAM: CT CHEST WITHOUT CONTRAST TECHNIQUE: Multidetector CT imaging of the chest was performed following the standard protocol without IV contrast. RADIATION DOSE REDUCTION: This exam was performed according to the departmental dose-optimization program which includes automated exposure control, adjustment of the mA and/or kV according to patient size and/or use of iterative reconstruction technique. COMPARISON:  06/25/2022 FINDINGS: Cardiovascular: Mild atherosclerotic calcification of the aortic arch. Mediastinum/Nodes: There is hazy diffuse edema/infiltration of the mediastinal and subcutaneous adipose tissues compatible with third spacing of fluid. Lungs/Pleura: Large loculated right hydropneumothorax. Pigtail right pleural drainage catheter in place. IDA. Made only about 25% of the right lung is aerated. There is scattered atelectasis in the right lung. Moderate to large left pleural effusion encompassing about 1/3 of the left hemithoracic volume. No definite mass or nodule in the aerated portion of the left lung. Upper Abdomen: Stable small hypodense hepatic lesions, compatible with cysts. Stable benign cyst in the left kidney upper pole, not requiring further imaging workup. Musculoskeletal: The CT scan from 06/25/2022 showed enhancing lesions in the right chest wall and axilla. These are substantially less conspicuous today although likely mainly from lack of IV contrast rather than resolution. A right axillary mass or lymph node measures 1.8  cm in short axis on image 52 series 2, formerly 1.9 cm. Bony destructive findings of the right fifth rib laterally as on image 62 series 5. Sclerotic  focus possibly a healing fracture in the right eighth rib laterally. Extensive sclerosis in the sternum favoring metastatic disease over infection. Nearly complete central collapse of the T3 vertebral body with associated sclerosis and minimal posterior bony retropulsion unchanged from prior. IMPRESSION: 1. Large loculated right hydropneumothorax encompassing about 75% of right hemithoracic volume. Pigtail right pleural drainage catheter in place. 2. Moderate to large left pleural effusion encompassing about 1/3 of the left hemithoracic volume. 3. Right axillary mass or lymph node measures 1.8 cm in short axis, formerly 1.9 cm. Other previously seen chest wall soft tissue lesions are less conspicuous on today's exam due to lack of IV contrast, but likely still present. 4. Bony destructive findings of the right fifth rib laterally compatible with metastatic disease. 5. Extensive sclerosis in the sternum favoring metastatic disease over infection. 6. Nearly complete central collapse of the T3 vertebral body with associated sclerosis and minimal posterior bony retropulsion, unchanged from prior. 7. Diffuse hazy edema/infiltration of the mediastinal and subcutaneous adipose tissues compatible with third spacing of fluid. 8. Aortic atherosclerosis. Aortic Atherosclerosis (ICD10-I70.0). Electronically Signed   By: Van Clines M.D.   On: 08/10/2022 10:39   DG Chest Port 1 View  Result Date: 08/10/2022 CLINICAL DATA:  Pleural effusion. Follow-up. Acute respiratory failure. EXAM: PORTABLE CHEST 1 VIEW COMPARISON:  08/08/2022 FINDINGS: There is a right-sided pigtail thoracostomy tube overlying the medial right lower lung. Right-sided, locule hydropneumothorax is identified. No significant change in volume compared with the previous exam. Moderate left pleural  effusion appears increased in volume. IMPRESSION: 1. No significant change in volume of right-sided hydropneumothorax. 2. Increase in volume of moderate left pleural effusion. 3. Stable position of right thoracostomy tube. Electronically Signed   By: Kerby Moors M.D.   On: 08/10/2022 09:07        Scheduled Meds:  anastrozole  1 mg Oral Daily   calcium carbonate  1 tablet Oral BID   Chlorhexidine Gluconate Cloth  6 each Topical Daily   enoxaparin (LOVENOX) injection  40 mg Subcutaneous Q24H   famotidine  20 mg Oral Daily   feeding supplement  237 mL Oral BID BM   hydrocortisone sod succinate (SOLU-CORTEF) inj  50 mg Intravenous Q6H   megestrol  40 mg Oral BID   metroNIDAZOLE   Topical Q1400   midodrine  10 mg Oral Q8H   mirtazapine  7.5 mg Oral QHS   naLOXone (NARCAN)  injection  0.1 mg Intravenous Once   polyethylene glycol  17 g Oral Daily   senna  2 tablet Oral BID   sodium chloride flush  10 mL Intrapleural Q8H   sodium chloride flush  3 mL Intravenous Q12H   Continuous Infusions:  sodium chloride     sodium chloride Stopped (08/06/22 1241)   sodium chloride 75 mL/hr at 08/11/22 0445   sodium chloride Stopped (08/08/22 1614)    ceFAZolin (ANCEF) IV Stopped (08/11/22 0330)   norepinephrine (LEVOPHED) Adult infusion Stopped (08/08/22 1544)   phenylephrine (NEO-SYNEPHRINE) Adult infusion Stopped (08/08/22 0917)     LOS: 7 days    Time spent: 35 minutes    Barb Merino, MD [facility] Pager [phone]

## 2022-08-11 NOTE — Progress Notes (Signed)
Physical Therapy Treatment Patient Details Name: [name] MRN: [id number] DOB: 12/15/1960 Today's Date: 08/11/2022   History of Present Illness [name] is a 61 yo woman with a hx of metastatic breast cancer, HTN, admitted 11/4 with decreased appetite, poor po intake , weakness, lethargy,persistent pleural effusion despite thoracentesis, FTT, transfer to iCU 11/6 for decreased response and  low BP,  right CT placed .    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 Patient assisted to sitting and stand and step to recliner  with 2 persons assisting.  Patient with 30-40 RR,shallow breaths, patient is on RA, SPO2 > 90% during activity, [id number]   Recommendations for follow up therapy are one component of a multi-disciplinary discharge planning process, led by the attending physician.  Recommendations may be updated based on patient status, additional functional criteria and insurance authorization.  Follow Up Recommendations  Skilled nursing-short term rehab (<3 hours/day) Can patient physically be transported by private vehicle: No   Assistance Recommended at Discharge Frequent or constant Supervision/Assistance  Patient can return home with the following A lot of help with walking and/or transfers;A lot of help with bathing/dressing/bathroom;Assistance with cooking/housework;Assist for transportation   Equipment Recommendations  None recommended by PT    Recommendations for Other Services       Precautions / Restrictions Precautions Precautions: Fall Precaution Comments: Right CT wall suction,on RA,     Mobility  Bed Mobility Overal bed mobility: Needs Assistance Bed Mobility: Supine to Sit     Supine to sit: Mod assist, +2 for safety/equipment, +2 for physical assistance     General bed mobility comments: HOB raised. Assisted with legs   and trunk, Right CT managed    Transfers Overall transfer level: Needs  assistance Equipment used: 2 person hand held assist Transfers: Sit to/from Stand Sit to Stand: Mod assist, +2 safety/equipment           General transfer comment: Patient stodd at bedside with 2 armhold assitance, able to step to recliner slowly. decreased control of descent    Ambulation/Gait                   Stairs             Wheelchair Mobility    Modified Rankin (Stroke Patients Only)       Balance Overall balance assessment: Needs assistance Sitting-balance support: Bilateral upper extremity supported, Feet supported Sitting balance-Leahy Scale: Fair Sitting balance - Comments: leans forward on UE's   Standing balance support: Bilateral upper extremity supported, During functional activity Standing balance-Leahy Scale: Poor Standing balance comment: reliant on Hand hold support                            Cognition Arousal/Alertness: Awake/alert Behavior During Therapy: WFL for tasks assessed/performed, Restless                                   General Comments: alert, follows directions, conversive about   being a Pharmacist, hospital, 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 Comments        Pertinent Vitals/Pain Pain Assessment Faces Pain Scale: Hurts even more Pain Location: abdomen , right side Pain Descriptors / Indicators: Discomfort, Moaning, Grimacing, Restless Pain Intervention(s): Monitored during  session, Patient requesting pain meds-RN notified, Limited activity within patient's tolerance    Home Living Family/patient expects to be discharged to:: Private residence Living Arrangements: Alone                      Prior Function            PT Goals (current goals can now be found in the care plan section) Progress towards PT goals: Progressing toward goals    Frequency    Min 2X/week      PT Plan Current plan remains appropriate    Co-evaluation               AM-PAC PT "6 Clicks" Mobility   Outcome Measure  Help needed turning from your back to your side while in a flat bed without using bedrails?: A Lot Help needed moving from lying on your back to sitting on the side of a flat bed without using bedrails?: A Lot Help needed moving to and from a bed to a chair (including a wheelchair)?: A Lot Help needed standing up from a chair using your arms (e.g., wheelchair or bedside chair)?: Total Help needed to walk in hospital room?: Total Help needed climbing 3-5 steps with a railing? : Total 6 Click Score: 9    End of Session   Activity Tolerance: Patient limited by fatigue;Patient tolerated treatment well Patient left: in chair;with call bell/phone within reach;with chair alarm set Nurse Communication: Mobility status PT Visit Diagnosis: Unsteadiness on feet (R26.81);Adult, failure to thrive (R62.7);Muscle weakness (generalized) (M62.81)     Time: 2536-6440 PT Time Calculation (min) (ACUTE ONLY): 21 min  Charges:  $Therapeutic Activity: 8-22 mins                     [place] Office [phone] Weekend [phone]    Claretha Cooper 08/11/2022, 3:03 PM

## 2022-08-12 ENCOUNTER — Inpatient Hospital Stay (HOSPITAL_COMMUNITY): Payer: BC Managed Care – PPO

## 2022-08-12 ENCOUNTER — Encounter: Payer: Self-pay | Admitting: Hematology and Oncology

## 2022-08-12 DIAGNOSIS — A4901 Methicillin susceptible Staphylococcus aureus infection, unspecified site: Secondary | ICD-10-CM | POA: Diagnosis not present

## 2022-08-12 DIAGNOSIS — J910 Malignant pleural effusion: Secondary | ICD-10-CM | POA: Diagnosis not present

## 2022-08-12 DIAGNOSIS — J90 Pleural effusion, not elsewhere classified: Secondary | ICD-10-CM | POA: Diagnosis not present

## 2022-08-12 DIAGNOSIS — Z4682 Encounter for fitting and adjustment of non-vascular catheter: Secondary | ICD-10-CM

## 2022-08-12 DIAGNOSIS — R5381 Other malaise: Secondary | ICD-10-CM

## 2022-08-12 DIAGNOSIS — J869 Pyothorax without fistula: Secondary | ICD-10-CM | POA: Diagnosis not present

## 2022-08-12 LAB — CBC
HCT: 28.1 % — ABNORMAL LOW (ref 36.0–46.0)
Hemoglobin: 9.2 g/dL — ABNORMAL LOW (ref 12.0–15.0)
MCH: 26.2 pg (ref 26.0–34.0)
MCHC: 32.7 g/dL (ref 30.0–36.0)
MCV: 80.1 fL (ref 80.0–100.0)
Platelets: 461 10*3/uL — ABNORMAL HIGH (ref 150–400)
RBC: 3.51 MIL/uL — ABNORMAL LOW (ref 3.87–5.11)
RDW: 16 % — ABNORMAL HIGH (ref 11.5–15.5)
WBC: 14.8 10*3/uL — ABNORMAL HIGH (ref 4.0–10.5)
nRBC: 0 % (ref 0.0–0.2)

## 2022-08-12 LAB — BASIC METABOLIC PANEL
Anion gap: 5 (ref 5–15)
BUN: 23 mg/dL (ref 8–23)
CO2: 25 mmol/L (ref 22–32)
Calcium: 7.1 mg/dL — ABNORMAL LOW (ref 8.9–10.3)
Chloride: 112 mmol/L — ABNORMAL HIGH (ref 98–111)
Creatinine, Ser: 0.56 mg/dL (ref 0.44–1.00)
GFR, Estimated: >60 mL/min (ref >60)
Glucose, Bld: 127 mg/dL — ABNORMAL HIGH (ref 70–99)
Potassium: 4.2 mmol/L (ref 3.5–5.1)
Sodium: 142 mmol/L (ref 135–145)

## 2022-08-12 LAB — MAGNESIUM
Magnesium: 1.8 mg/dL (ref 1.7–2.4)
Magnesium: 2.5 mg/dL — ABNORMAL HIGH (ref 1.7–2.4)

## 2022-08-12 LAB — PHOSPHORUS
Phosphorus: 1.3 mg/dL — ABNORMAL LOW (ref 2.5–4.6)
Phosphorus: 1.9 mg/dL — ABNORMAL LOW (ref 2.5–4.6)

## 2022-08-12 MED ORDER — POTASSIUM & SODIUM PHOSPHATES 280-160-250 MG PO PACK
2.0000 | PACK | Freq: Four times a day (QID) | ORAL | Status: AC
  Administered 2022-08-12 (×2): 2 via ORAL
  Filled 2022-08-12 (×2): qty 2

## 2022-08-12 MED ORDER — MAGNESIUM SULFATE 2 GM/50ML IV SOLN
2.0000 g | Freq: Once | INTRAVENOUS | Status: AC
  Administered 2022-08-12: 2 g via INTRAVENOUS
  Filled 2022-08-12: qty 50

## 2022-08-12 MED ORDER — SODIUM PHOSPHATES 45 MMOLE/15ML IV SOLN
45.0000 mmol | Freq: Once | INTRAVENOUS | Status: AC
  Administered 2022-08-12: 45 mmol via INTRAVENOUS
  Filled 2022-08-12: qty 15

## 2022-08-12 MED ORDER — MAGNESIUM SULFATE 4 GM/100ML IV SOLN
4.0000 g | Freq: Once | INTRAVENOUS | Status: AC
  Administered 2022-08-12: 4 g via INTRAVENOUS
  Filled 2022-08-12: qty 100

## 2022-08-12 NOTE — Progress Notes (Signed)
[facility] Progress Note Patient Name: Dalia Jollie DOB: Jul 24, 1961 MRN: [id number]   Date of Service  08/12/2022  HPI/Events of Note  PO4 1.3, K+ 4.2  eICU Interventions  Phosphorus replaced per E-Link Electrolyte Replacement Protocol.        Kerry Kass Jonnie Kubly 08/12/2022, 5:22 AM

## 2022-08-12 NOTE — Progress Notes (Signed)
[name]   DOB:1961/01/18   VW#:[id number]    ASSESSMENT & PLAN:  Metastatic breast cancer to the lung and bones While she responded very well to chemotherapy and antiestrogen therapy, she is not thriving due to progressive weight loss and weakness While hospitalized, I recommend we continue on antiestrogen therapy but hold abemaciclib Recent CT imaging showed continuous improvement in response to therapy despite being off abemaciclib   Recurrent malignant pleural effusion with MSSA empyema I would defer management to pulmonologist/ICU team   malignant cachexia Failure to thrive Severe protein calorie malnutrition I encouraged the patient to continue frequent small meals She appears to be eating more in the last few days   Cancer associated pain She had recent confusion episode/altered mental status due to narcotics Her pain control has improved when her pain medicine is on a scheduled basis  Generalized weakness Failure to thrive Multifactorial, related to progressive weight loss, muscle loss and deconditioning Recommend skilled nursing facility placement upon discharge   Code Status With a progressive decline in empyema, I reviewed the CODE STATUS with the patient twice on November 9 Ultimately, she agreed with changing her CODE STATUS to DO NOT RESUSCITATE   Goals of care Improvement of weakness, infection and nutrition status   Discharge planning Unknown at this point I gave an update to her mother today I will be back on Thursday to check on her  All questions were answered. The patient knows to call the clinic with any problems, questions or concerns.   The total time spent in the appointment was 40 minutes encounter with patients including review of chart and various tests results, discussions about plan of care and coordination of care plan  Heath Lark, MD 08/12/2022 7:53 AM  Subjective:  She is feeling better Pain is better controlled I reviewed  documentation from the ICU team She has been afebrile  Objective:  Vitals:   08/12/22 0300 08/12/22 0400  BP: 128/76 134/71  Pulse: 95 (!) 102  Resp: (!) 31 20  Temp:  98 F (36.7 C)  SpO2: 98% 96%     Intake/Output Summary (Last 24 hours) at 08/12/2022 0753 Last data filed at 08/12/2022 0657 Gross per 24 hour  Intake 2598.19 ml  Output 1500 ml  Net 1098.19 ml    GENERAL:alert, no distress and comfortable NEURO: alert & oriented x 3 with fluent speech, no focal motor/sensory deficits   Labs:  Recent Labs    08/05/22 0458 08/06/22 0456 08/06/22 1549 08/07/22 0428 08/08/22 0500 08/09/22 1113 08/10/22 0522 08/11/22 1052 08/12/22 0241  NA 136 136  --  137 138   < > 140 142 142  K 4.0 4.2  --  4.2 4.2   < > 3.8 3.3* 4.2  CL 103 102  --  105 104   < > 107 107 112*  CO2 25 28  --  28 29   < > '28 25 25  '$ GLUCOSE 129* 124*  --  135* 135*   < > 179* 204* 127*  BUN 33* 35*  --  30* 25*   < > '23 22 23  '$ CREATININE 1.85* 1.38*  --  1.08* 0.80   < > 0.72 0.64 0.56  CALCIUM 7.1* 8.0*  --  7.7* 7.8*   < > 7.4* 7.2* 7.1*  GFRNONAA 30* 43*  --  58* >60   < > >60 >60 >60  PROT 6.3* 6.0* 6.1* 6.0*  --   --   --   --   --  ALBUMIN 2.4* 2.3*  --  2.2* 2.1*  --   --   --   --   AST 22 18  --  20  --   --   --   --   --   ALT 23 21  --  20  --   --   --   --   --   ALKPHOS 50 49  --  49  --   --   --   --   --   BILITOT 0.3 0.4  --  0.3  --   --   --   --   --    < > = values in this interval not displayed.    Studies:  DG CHEST PORT 1 VIEW  Result Date: 08/12/2022 CLINICAL DATA:  [id number] with right hydropneumothorax, left pleural effusion. EXAM: PORTABLE CHEST 1 VIEW COMPARISON:  Portable chest yesterday at 7:28 a.m. FINDINGS: 4:29 a.m. Pigtail chest tube with tip in the medial right chest base is similar in positioning. Moderate to large bilateral pleural effusions, on the left layering posteriorly without pneumothorax, on the right again with loculated hydropneumothorax and  extrapleural air again noted laterally. The right hydropneumothorax is estimated to occupy much as 50% of the right chest volume with the hydro component predominating. There is overlying consolidation or atelectasis in the lower lung fields. The overall aeration pattern seems unchanged. There is cardiomegaly with mild central vascular prominence. No overt edema is seen. Stable mediastinum. Again noted is a destructive lesion of the lateral right fifth rib likely metastasis. The patient is rotated to the right. IMPRESSION: 1. No significant change in the appearance of the chest since yesterday's study. 2. The right hydropneumothorax is estimated to occupy much as 50% of the right chest volume with the hydro component predominating. 3. There is overlying consolidation or atelectasis in the lower lung fields with moderate-to-large layering left effusion. 4. Cardiomegaly with mild central vascular prominence. Electronically Signed   By: Telford Nab M.D.   On: 08/12/2022 06:03   DG Chest Port 1 View  Result Date: 08/11/2022 CLINICAL DATA:  Pleural effusion, metastatic breast carcinoma EXAM: PORTABLE CHEST 1 VIEW COMPARISON:  Previous studies including the examination of 08/10/2022 FINDINGS: Apparent shift of mediastinum to the right may be due to rotation. Transverse diameter of heart is increased. Moderate to large bilateral pleural effusions are seen. There is interval decrease in right pleural effusion. Single right chest tube is seen. Central pulmonary vessels are less prominent. Evaluation of right mid and both lower lung fields for infiltrates is limited by the effusions. There is no pneumothorax. IMPRESSION: Moderate to large bilateral pleural effusions. There is interval decrease in right pleural effusion. Electronically Signed   By: Elmer Picker M.D.   On: 08/11/2022 09:29   CT CHEST WO CONTRAST  Result Date: 08/10/2022 CLINICAL DATA:  Metastatic breast cancer.  Empyema. * Tracking Code: BO  * EXAM: CT CHEST WITHOUT CONTRAST TECHNIQUE: Multidetector CT imaging of the chest was performed following the standard protocol without IV contrast. RADIATION DOSE REDUCTION: This exam was performed according to the departmental dose-optimization program which includes automated exposure control, adjustment of the mA and/or kV according to patient size and/or use of iterative reconstruction technique. COMPARISON:  06/25/2022 FINDINGS: Cardiovascular: Mild atherosclerotic calcification of the aortic arch. Mediastinum/Nodes: There is hazy diffuse edema/infiltration of the mediastinal and subcutaneous adipose tissues compatible with third spacing of fluid. Lungs/Pleura: Large loculated right hydropneumothorax. Pigtail right pleural drainage catheter  in place. IDA. Made only about 25% of the right lung is aerated. There is scattered atelectasis in the right lung. Moderate to large left pleural effusion encompassing about 1/3 of the left hemithoracic volume. No definite mass or nodule in the aerated portion of the left lung. Upper Abdomen: Stable small hypodense hepatic lesions, compatible with cysts. Stable benign cyst in the left kidney upper pole, not requiring further imaging workup. Musculoskeletal: The CT scan from 06/25/2022 showed enhancing lesions in the right chest wall and axilla. These are substantially less conspicuous today although likely mainly from lack of IV contrast rather than resolution. A right axillary mass or lymph node measures 1.8 cm in short axis on image 52 series 2, formerly 1.9 cm. Bony destructive findings of the right fifth rib laterally as on image 62 series 5. Sclerotic focus possibly a healing fracture in the right eighth rib laterally. Extensive sclerosis in the sternum favoring metastatic disease over infection. Nearly complete central collapse of the T3 vertebral body with associated sclerosis and minimal posterior bony retropulsion unchanged from prior. IMPRESSION: 1. Large  loculated right hydropneumothorax encompassing about 75% of right hemithoracic volume. Pigtail right pleural drainage catheter in place. 2. Moderate to large left pleural effusion encompassing about 1/3 of the left hemithoracic volume. 3. Right axillary mass or lymph node measures 1.8 cm in short axis, formerly 1.9 cm. Other previously seen chest wall soft tissue lesions are less conspicuous on today's exam due to lack of IV contrast, but likely still present. 4. Bony destructive findings of the right fifth rib laterally compatible with metastatic disease. 5. Extensive sclerosis in the sternum favoring metastatic disease over infection. 6. Nearly complete central collapse of the T3 vertebral body with associated sclerosis and minimal posterior bony retropulsion, unchanged from prior. 7. Diffuse hazy edema/infiltration of the mediastinal and subcutaneous adipose tissues compatible with third spacing of fluid. 8. Aortic atherosclerosis. Aortic Atherosclerosis (ICD10-I70.0). Electronically Signed   By: Van Clines M.D.   On: 08/10/2022 10:39   DG Chest Port 1 View  Result Date: 08/10/2022 CLINICAL DATA:  Pleural effusion. Follow-up. Acute respiratory failure. EXAM: PORTABLE CHEST 1 VIEW COMPARISON:  08/08/2022 FINDINGS: There is a right-sided pigtail thoracostomy tube overlying the medial right lower lung. Right-sided, locule hydropneumothorax is identified. No significant change in volume compared with the previous exam. Moderate left pleural effusion appears increased in volume. IMPRESSION: 1. No significant change in volume of right-sided hydropneumothorax. 2. Increase in volume of moderate left pleural effusion. 3. Stable position of right thoracostomy tube. Electronically Signed   By: Kerby Moors M.D.   On: 08/10/2022 09:07   DG Chest Port 1 View  Result Date: 08/09/2022 CLINICAL DATA:  Acute respiratory failure, right pleural effusion EXAM: PORTABLE CHEST 1 VIEW COMPARISON:  08/08/2022  FINDINGS: Interval placement of right basilar chest tube. Decreasing right pleural effusion. Moderate right effusion remains. No pneumothorax. Extensive airspace disease throughout the right lung. Left perihilar and lower lobe airspace disease with layering left effusion. Cardiomegaly. No acute bony abnormality. IMPRESSION: Interval placement of right chest tube with decreasing right pleural effusion and slight improved aeration within the right lung. Continued diffuse airspace disease on the right and in the left lower lung. Bilateral effusions. No pneumothorax. Electronically Signed   By: Rolm Baptise M.D.   On: 08/09/2022 08:12   CT [facility] PLEURAL DRAIN W/INDWELL CATH W/IMG GUIDE  Result Date: 08/08/2022 INDICATION: 61 year old woman with right empyema presents to IR for chest tube placement EXAM: CT-guided right chest tube  placement TECHNIQUE: Multidetector CT imaging of the chest was performed following the standard protocol without IV contrast. RADIATION DOSE REDUCTION: This exam was performed according to the departmental dose-optimization program which includes automated exposure control, adjustment of the mA and/or kV according to patient size and/or use of iterative reconstruction technique. MEDICATIONS: The patient is currently admitted to the hospital and receiving intravenous antibiotics. The antibiotics were administered within an appropriate time frame prior to the initiation of the procedure. ANESTHESIA/SEDATION: Moderate (conscious) sedation was employed during this procedure. A total of Versed 2 mg and Fentanyl 100 mcg was administered intravenously by the radiology nurse. Total intra-service moderate Sedation Time: 14 minutes. The patient's level of consciousness and vital signs were monitored continuously by radiology nursing throughout the procedure under my direct supervision. COMPLICATIONS: None immediate. PROCEDURE: Informed written consent was obtained from the patient after a thorough  discussion of the procedural risks, benefits and alternatives. All questions were addressed. Maximal Sterile Barrier Technique was utilized including caps, mask, sterile gowns, sterile gloves, sterile drape, hand hygiene and skin antiseptic. A timeout was performed prior to the initiation of the procedure. Patient positioned left lateral decubitus on the procedure table. The right lateral chest wall skin prepped and draped in usual fashion. Following local lidocaine administration, right pleural space was accessed with 19 gauge Yueh needle utilizing CT guidance. Yueh catheter removed over 0.035 inch Amplatz guidewire, serial dilation performed, and 14 [place] multipurpose pigtail drain inserted. CT confirmed appropriate positioning of the drain within the right pleural space. The drain was connected to Pleur-Evac and secured to skin with suture. IMPRESSION: CT-guided right chest tube (14 [place]) as above. Electronically Signed   By: Miachel Roux M.D.   On: 08/08/2022 17:02   DG Chest Port 1 View  Result Date: 08/08/2022 CLINICAL DATA:  Follow-up pleural effusion. EXAM: PORTABLE CHEST 1 VIEW COMPARISON:  08/07/2022 FINDINGS: Exam detail is diminished due to patient positioning with significant rightward rotational artifact. The cardiomediastinal contours appear unchanged. There is a large right pleural effusion which is unchanged in volume compared with the previous exam. There is significantly diminished aeration to the right lung with only a small amount of aerated lung in the right upper lobe. Small left pleural effusion is unchanged. IMPRESSION: 1. No change in large right pleural effusion with significantly diminished aeration to the right lung. Electronically Signed   By: Kerby Moors M.D.   On: 08/08/2022 07:05   DG Chest Port 1 View  Result Date: 08/07/2022 CLINICAL DATA:  Pleural effusion EXAM: PORTABLE CHEST 1 VIEW COMPARISON:  08/06/2022 FINDINGS: No significant change in rotated AP portable  examination. Large right pleural effusion with near total atelectasis or consolidation of the right lung, as well as a small, layering left pleural effusion. Cardiomegaly, the cardiac borders largely obscured. IMPRESSION: No significant change in rotated AP portable examination. Large right pleural effusion with near total atelectasis or consolidation of the right lung, as well as a small, layering left pleural effusion. Electronically Signed   By: Delanna Ahmadi M.D.   On: 08/07/2022 09:17   DG Chest Port 1 View  Result Date: 08/06/2022 CLINICAL DATA:  S/P thoracentesis EXAM: PORTABLE CHEST 1 VIEW COMPARISON:  Same day chest x-ray. FINDINGS: Decreased but still large right pleural effusion. Small left pleural effusion. Bilateral overlying opacities. No visible pneumothorax on this semi erect radiograph. Cardiomediastinal silhouette is similar. Polyarticular degenerative change. IMPRESSION: 1. Decreased but still large right pleural effusion. No visible pneumothorax on this semi erect radiograph. 2.  Small left pleural effusion 3. Bilateral overlying opacities. Electronically Signed   By: Margaretha Sheffield M.D.   On: 08/06/2022 15:50   DG Chest 1 View  Result Date: 08/06/2022 CLINICAL DATA:  Respiratory failure. EXAM: CHEST  1 VIEW COMPARISON:  Multiple recent chest x-rays. FINDINGS: Persistent complete opacification of the right hemithorax due to a large pleural effusion. Slight shift of the heart and mediastinum to the left exaggerated by the rotation of the patient and thoracic scoliosis. Persistent left lower lobe atelectasis but no left pleural effusion. IMPRESSION: Persistent complete opacification of the right hemithorax. Electronically Signed   By: Marijo Sanes M.D.   On: 08/06/2022 11:48   DG Chest Port 1 View  Result Date: 08/05/2022 CLINICAL DATA:  Shortness of breath EXAM: PORTABLE CHEST 1 VIEW COMPARISON:  08/05/2022, 06/25/2022, 08/03/2022 FINDINGS: Complete opacification of right thorax  without change. Subsegmental atelectasis left base. Partially obscured cardiomediastinal silhouette. IMPRESSION: Complete opacification of the right thorax without change, probably due to sizable pleural effusion. Subsegmental atelectasis left base Electronically Signed   By: Donavan Foil M.D.   On: 08/05/2022 23:33   ECHOCARDIOGRAM COMPLETE  Result Date: 08/05/2022    ECHOCARDIOGRAM REPORT   Patient Name:   ROBERT SUNGA Date of Exam: 08/05/2022 Medical Rec #:  [id number]       Height:       66.0 in Accession #:    [id number]      Weight:       118.6 lb Date of Birth:  Jun 02, 1961        BSA:          1.602 m Patient Age:    61 years        BP:           97/44 mmHg Patient Gender: F               HR:           78 bpm. Exam Location:  Inpatient Procedure: 2D Echo, Cardiac Doppler and Color Doppler Indications:    R94.31 Abnormal EKG  History:        Patient has no prior history of Echocardiogram examinations.                 Risk Factors:Hypertension. Metastatic breast cancer.  Sonographer:    [place] Referring Phys: [id number] AMRIT ADHIKARI  Sonographer Comments: Technically difficult study due to poor echo windows, suboptimal apical window and suboptimal subcostal window. Apical images off axis due to the hardened texture of the left breast. Could not turn patient. Wound dressings in subcostal region. IMPRESSIONS  1. Left ventricular ejection fraction, by estimation, is 55 to 60%. The left ventricle has normal function. The left ventricle has no regional wall motion abnormalities. Left ventricular diastolic parameters were normal.  2. Right ventricular systolic function is normal. The right ventricular size is normal. Mildly increased right ventricular wall thickness. There is mildly elevated pulmonary artery systolic pressure. The estimated right ventricular systolic pressure is 76.7 mmHg.  3. Left atrial size was mildly dilated.  4. A small pericardial effusion is present. The pericardial effusion is  circumferential. There is no evidence of cardiac tamponade. Large pleural effusion in the right lateral region.  5. 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 Tricuspid valve regurgitation is mild to moderate.  7. The aortic valve is tricuspid.  Aortic valve regurgitation is not visualized. No aortic stenosis is present.  8. The inferior vena cava is normal in size with greater than 50% respiratory variability, suggesting right atrial pressure of 3 mmHg. FINDINGS  Left Ventricle: Left ventricular ejection fraction, by estimation, is 55 to 60%. The left ventricle has normal function. The left ventricle has no regional wall motion abnormalities. The left ventricular internal cavity size was normal in size. There is  no left ventricular hypertrophy. Left ventricular diastolic parameters were normal. Right Ventricle: The right ventricular size is normal. Mildly increased right ventricular wall thickness. Right ventricular systolic function is normal. There is mildly elevated pulmonary artery systolic pressure. The tricuspid regurgitant velocity is 3.19 m/s, and with an assumed right atrial pressure of 3 mmHg, the estimated right ventricular systolic pressure is 06.2 mmHg. Left Atrium: Left atrial size was mildly dilated. Right Atrium: Right atrial size was normal in size. Pericardium: Complex septated fluid collection in the right pleural cavity, consider empyema or malignant effusion. A small pericardial effusion is present. The pericardial effusion is circumferential. There is no evidence of cardiac tamponade. 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 There is mild thickening of the mitral valve leaflet(s). Mild mitral valve regurgitation, with centrally-directed jet. Tricuspid Valve: The tricuspid valve is  myxomatous. Tricuspid valve regurgitation is mild to moderate. Aortic Valve: The aortic valve is tricuspid. Aortic valve regurgitation is not visualized. No aortic stenosis is present. Pulmonic Valve: The pulmonic valve was normal in structure. Pulmonic valve regurgitation is mild. Aorta: The aortic root and ascending aorta are structurally normal, with no evidence of dilitation. Venous: IVC assessment for right atrial pressure unable to be performed due to mechanical ventilation. The inferior vena cava is normal in size with greater than 50% respiratory variability, suggesting right atrial pressure of 3 mmHg. IAS/Shunts: No atrial level shunt detected by color flow Doppler. Additional Comments: There is a large pleural effusion in the right lateral region.  LEFT VENTRICLE PLAX 2D LVIDd:         4.43 cm     Diastology LVIDs:         2.80 cm     LV e' medial:    5.08 cm/s LV PW:         1.03 cm     LV E/e' medial:  10.3 LV IVS:        0.87 cm     LV e' lateral:   6.46 cm/s LVOT diam:     2.10 cm     LV E/e' lateral: 8.1 LV SV:         44 LV SV Index:   28 LVOT Area:     3.46 cm  LV Volumes (MOD) LV vol d, MOD A4C: 57.5 ml LV vol s, MOD A4C: 18.7 ml LV SV MOD A4C:     57.5 ml RIGHT VENTRICLE             IVC RV S prime:     13.10 cm/s  IVC diam: 1.10 cm TAPSE (M-mode): 1.7 cm LEFT ATRIUM           Index        RIGHT ATRIUM           Index LA diam:      3.20 cm 2.00 cm/m   RA Area:     14.20 cm LA Vol (A4C): 57.5 ml 35.90 ml/m  RA Volume:  35.90 ml  22.42 ml/m  AORTIC VALVE             PULMONIC VALVE LVOT Vmax:   75.15 cm/s  PR End Diast Vel: 2.14 msec LVOT Vmean:  45.850 cm/s LVOT VTI:    0.127 m  AORTA Ao Root diam: 2.60 cm Ao Asc diam:  2.70 cm MITRAL VALVE               TRICUSPID VALVE MV Area (PHT): 4.89 cm    TR Peak grad:   40.7 mmHg MV Decel Time: 155 msec    TR Vmax:        319.00 cm/s MV E velocity: 52.40 cm/s MV A velocity: 50.00 cm/s  SHUNTS MV E/A ratio:  1.05        Systemic VTI:  0.13 m                             Systemic Diam: 2.10 cm Dani Gobble Croitoru MD Electronically signed by Sanda Klein MD Signature Date/Time: 08/05/2022/12:38:14 PM    Final    DG CHEST PORT 1 VIEW  Result Date: 08/05/2022 CLINICAL DATA:  Right pleural effusion. EXAM: PORTABLE CHEST 1 VIEW COMPARISON:  08/05/2022 FINDINGS: Stable cardiomediastinal contours. Left lung is clear. Complete opacification of the right hemithorax is again noted and appears unchanged. Findings are favored to represent known large right pleural effusion. IMPRESSION: 1. No change in complete opacification of the right hemithorax which is presumed to represent large right pleural effusion. Electronically Signed   By: Kerby Moors M.D.   On: 08/05/2022 10:06   DG CHEST PORT 1 VIEW  Result Date: 08/05/2022 CLINICAL DATA:  Respiratory compromise EXAM: PORTABLE CHEST 1 VIEW COMPARISON:  08/03/2022 FINDINGS: Complete opacification of the right hemithorax, presumably reflecting a large pleural effusion. Left lung is clear. No pneumothorax. The heart is normal in size. IMPRESSION: Complete opacification of the right hemithorax, presumably reflecting a large pleural effusion. Electronically Signed   By: Julian Hy M.D.   On: 08/05/2022 00:41   US THORACENTESIS ASP PLEURAL SPACE W/IMG GUIDE  Result Date: 08/03/2022 INDICATION: Patient with history of right-sided breast cancer, right pleural effusion. Request is made for diagnostic and therapeutic thoracentesis. EXAM: ULTRASOUND GUIDED RIGHT THORACENTESIS MEDICATIONS: 10 mL 1% lidocaine COMPLICATIONS: None immediate. PROCEDURE: An ultrasound guided thoracentesis was thoroughly discussed with the patient and questions answered. The benefits, risks, alternatives and complications were also discussed. The patient understands and wishes to proceed with the procedure. Written consent was obtained. Ultrasound was performed to localize and mark an adequate pocket of fluid in the right chest. The area was then  prepped and draped in the normal sterile fashion. 1% Lidocaine was used for local anesthesia. Under ultrasound guidance a 6 Fr Safe-T-Centesis catheter was introduced. Thoracentesis was performed. The catheter was removed and a dressing applied. FINDINGS: A total of approximately 800 mL of yellow fluid was removed. Samples were sent to the laboratory as requested by the clinical team. IMPRESSION: Successful ultrasound guided right thoracentesis yielding 800 mL of pleural fluid. Read by: Brynda Greathouse PA-C Electronically Signed   By: Sandi Mariscal M.D.   On: 08/03/2022 13:06   DG Chest Port 1 View  Result Date: 08/03/2022 CLINICAL DATA:  Post right thoracentesis EXAM: PORTABLE CHEST 1 VIEW COMPARISON:  08/02/2022 FINDINGS: Continued complete opacification of the right hemithorax. No visible pneumothorax. No confluent opacity or effusion on the left. Heart is normal size. IMPRESSION: Continued  complete opacification of the right hemithorax. No visible pneumothorax. Electronically Signed   By: Rolm Baptise M.D.   On: 08/03/2022 11:52   DG Chest Portable 1 View  Result Date: 08/02/2022 CLINICAL DATA:  Weakness and breast cancer.  Dehydration.  Fatigue. EXAM: PORTABLE CHEST 1 VIEW COMPARISON:  Chest CT 06/25/2022 FINDINGS: Increased right pleural effusion with complete opacification of the right hemithorax. There is leftward shift of the trachea and mediastinal structures. No aerated right lung is seen. No focal airspace disease in the left lung. Left lung nodules on prior CT are not seen by radiograph. No visible pneumothorax. Osseous metastatic disease on CT not well demonstrated. IMPRESSION: Increased right pleural effusion with complete opacification of the right hemithorax. No aerated right lung is seen. Leftward shift of the trachea and mediastinal structures. Electronically Signed   By: Keith Rake M.D.   On: 08/02/2022 16:53

## 2022-08-12 NOTE — Progress Notes (Addendum)
Nutrition Follow-up  DOCUMENTATION CODES:  Severe malnutrition in context of chronic illness  INTERVENTION:  -Continue regular diet as tolerated. Provide meal preferences to oiptimize po intake. -Provide Ensure Plus HP TID (350kcal, 20g protein/bottle) -Encourage snacking throughout the day. Add snacks and desserts to trays to save for between meals -Encourage high protein/high calorie foods: peanut butter, eggs, yogurt, cheese, etc  NUTRITION DIAGNOSIS:  Severe Malnutrition related to chronic illness as evidenced by percent weight loss, severe fat depletion, severe muscle depletion.  GOAL:  Patient will meet greater than or equal to 90% of their needs  MONITOR:   PO intake, Supplement acceptance, Diet advancement  REASON FOR ASSESSMENT:  Malnutrition Screening Tool    ASSESSMENT:  Pt is a 61yo F with PMH of metastatic breast cancer, and HTN who presents with decreased appetite, poor po intake, weakness, and lethargy. Required transfer to ICU for hypotension and lethargy.  Seven day calorie count complete. Pt with improved po intake during admission but is still not meeting estimated nutrient needs. ONS increased to TID on 11/13. Pt is accepting both Ensure Plus HP and Boost Breeze. Ensure will provide more calories and protein/bottle, encourage this supplement first. Meal preferences being offered to optimize intake. Encourage snacking. Add desserts and snacks to trays to optimize intake. Note pt with weight gain during admission, however edema likely contributing.  Summary of Calorie Count: 11/7:  Breakfast: skipped Lunch: 100% orange jello, 100% lemon fruit ice, 50% cranberry juice Dinner: skipped Supplements: 2 Boost Breeze 685 kcal (41% of minimum estimated needs), 18g protein (23% of minimum estimated needs)  11/8: Breakfast: chicken broth  Lunch: jello and [place] ice Dinner: jello and [place] ice Supplements: 2 Boost Breeze (unclear if 100% consumed) 850 kcal (51%  of minimum estimated needs), 19g protein (24% of minimum estimated needs)  11/9: No information  11/10: 1/2 cup jello, 1 cran berry juice (ONS unknown) 110kcal (7%of minimum estimated needs), 0g protein  11/11: Breakfast: 1/2 Ensure Plus HP Lunch: 100% spaghetti and meat suace Dinner: 25% build your own flatbread Supplements: 1 additional Ensure Plus HP 1101 kcal (66% of minimum estimated needs), 49 protein (61% of minimum estimated needs)  11/12: Breakfast: 90% Cream of wheat with margarine and sugar Lunch: 90% pan seared salmon, 90% collard greens, 80% corn, 100% sweet tea Dinner: 25% build your own flatbread Supplements: 1 Ensure Plus HP 824 kcal (49% of minimum estimated needs), 44 protein (55% of minimum estimated needs)  11/13: No meal information Drank 2 Ensure Plus HP and 1 Boost Breeze 950kcal (57% of minimum estimated needs), 49g protein (61% of minimum estimated needs)  Diet Order:   Diet Order             Diet regular Room service appropriate? Yes; Fluid consistency: Thin  Diet effective now                   EDUCATION NEEDS:  Not appropriate for education at this time  Skin:  Skin Assessment: Reviewed RN Assessment  Last BM:  11/9  Height:  Ht Readings from Last 1 Encounters:  08/03/22 '5\' 6"'$  (1.676 m)   Weight:  Wt Readings from Last 1 Encounters:  08/12/22 59.5 kg    Ideal Body Weight:  59.1 kg  BMI:  Body mass index is 21.17 kg/m.  Estimated Nutritional Needs:  Kcal:  1675-2015kcal ([place] x 1.5-1.8, REE: 1119) Protein:  80-110g (1.5-2.0g/kg) Fluid:  1615-1866m (30-322mkg)  KaCandise BowensMS, RD, LDN, CNSC See AMiON for contact  information

## 2022-08-12 NOTE — Progress Notes (Signed)
[facility] ADULT ICU REPLACEMENT PROTOCOL   The patient does apply for the [facility] Adult ICU Electrolyte Replacment Protocol based on the criteria listed below:   1.Exclusion criteria: TCTS, ECMO, Dialysis, and Myasthenia Gravis patients 2. Is GFR >/= 30 ml/min? Yes.    Patient's GFR today is >60 3. Is SCr </= 2? Yes.   Patient's SCr is 0.56 mg/dL 4. Did SCr increase >/= 0.5 in 24 hours? No. 5.Pt's weight >40kg  Yes.   6. Abnormal electrolyte(s): Mag  7. Electrolytes replaced per protocol 8.  Call MD STAT for K+ </= 2.5, Phos </= 1, or Mag </= 1 Physician:  Dr Raliegh Scarlet, [place] Grumbling 08/12/2022 5:00 AM

## 2022-08-12 NOTE — Progress Notes (Signed)
Another follow up visit to check on patient. She was in chair today and more engaged in conversation. Appreciative of the visits.

## 2022-08-12 NOTE — Progress Notes (Signed)
PROGRESS NOTE    [name]  TMH:[id number] DOB: 10/10/1960 DOA: 08/02/2022 PCP: Heath Lark, MD    Brief Narrative:  61 year old female with neglected fungating metastatic breast cancer, hypertension, recurrent right-sided malignant pleural effusion who was initially admitted with decreased appetite, weakness and lethargy.  Initially underwent thoracentesis on admission that was growing MSSA.  Emergent thoracentesis 11/8.  11/4-admit to the hospital. 11/5-right-sided thoracentesis 800 mL removed, LDH 439, white cell 84%.  MSSA from the pleural fluid. 11/7-PCCM consulted and transferred to ICU, started on vasopressors with left groin central line.  Treated with BiPAP. 11/8-worsening pleural effusion, emergent thoracentesis done persistent staff. 11/10-pigtail catheter was placed and currently getting Lasix through the tube. 11/12 transfer to [facility] service.   Remains on pigtail catheter.  Remains on antibiotics. Pain control remains the issue.   Assessment & Plan:   Recurrent malignant pleural effusion on the right side with MSSA empyema: Septic shock secondary to MSSA empyema present on admission, resolving. Respiratory distress secondary to right-sided empyema, resolving.  Currently remains on cefazolin for MSSA, still remains on hydrocortisone and midodrine.  Blood cultures negative so far. Fluid culture with MSSA, currently on cefazolin.  Continue. Currently has a pigtail catheter in place, repeat CT scan shows large loculated pleural effusion.   Large amount of drainage, continue to receive Lytics. Patient is likely not a candidate for VATS.  PCCM continues to follow-up.  Transient A-fib/flutter on admission: Stable now.  Electrolytes: Magnesium and phosphorus replaced.  Recheck.  Chronic cancer related pain: On home opiate doses and also as needed Dilaudid.  Patient remains on Remeron. Changed to Dilaudid 4 mg every 4 hours, some control over the pain but is still not  adequate. Noted episode of unresponsiveness in the past, however her pain is not controlled.  Metastatic breast cancer on antiestrogen therapy.  Followed by her oncologist. Chronic anemia secondary to critical illness Severe protein calorie malnutrition Cancer cachexia Severe frailty and debility  Continue supportive care.  Oncology discussing goal of care.   Currently remains DNR. Hospice appropriate.  Patient is not agreeable.  She will need a SNF for rehab.   DVT prophylaxis: enoxaparin (LOVENOX) injection 40 mg Start: 08/06/22 2200 Place and maintain sequential compression device Start: 08/06/22 1943   Code Status: DNR Family Communication: None at the bedside Disposition Plan: Status is: Inpatient Remains inpatient appropriate because: Severely ill on IV antibiotics and procedures     Consultants:  Critical care Palliative care Oncology  Procedures:  Multiple thoracentesis, pigtail catheter  Antimicrobials:  Cefazolin 11/8---   Subjective: Seen and examined.  Busy night.  Afebrile.  On room air.  She was appreciative of more frequent dosing of oral Dilaudid.  She was asking me to find if there is any more pain regimen. I asked her whether she had considered palliation or hospice, patient tells me she is not there yet. More than 1 L through the chest tube.  Objective: Vitals:   08/12/22 0400 08/12/22 0500 08/12/22 0800 08/12/22 0900  BP: 134/71   (!) 138/119  Pulse: (!) 102   (!) 109  Resp: 20   (!) 29  Temp: 98 F (36.7 C)  98.2 F (36.8 C)   TempSrc: Oral  Oral   SpO2: 96%   98%  Weight:  59.5 kg    Height:        Intake/Output Summary (Last 24 hours) at 08/12/2022 1104 Last data filed at 08/12/2022 0800 Gross per 24 hour  Intake 2707.91 ml  Output  1500 ml  Net 1207.91 ml    Filed Weights   08/10/22 0500 08/11/22 0419 08/12/22 0500  Weight: 63.6 kg 60.5 kg 59.5 kg    Examination:  General exam: Appears calm and comfortable . Frail and  debilitated.  Chronically sick looking. Respiratory system: On room air.  Not in obvious distress.  Poor air entry on the right side. Chest tube with serous sanguinous drainage.  Free-flowing. Patient has extensive skin ulcerations, fungating breast mass on the right side.  Tumor on the skin. Cardiovascular system: S1 & S2 heard, RRR.  Gastrointestinal system: Soft.  Nondistended.  Bowel sound present.   Central nervous system: Alert and oriented. No focal neurological deficits. Extremities: Symmetric 5 x 5 power.  Lymphedema right hand. Skin: No rashes, lesions or ulcers Psychiatry: Judgement and insight appear normal. Mood & affect appropriate.     Data Reviewed: I have personally reviewed following labs and imaging studies  CBC: Recent Labs  Lab 08/08/22 0500 08/09/22 1113 08/10/22 0522 08/11/22 1052 08/12/22 0241  WBC 13.8* 11.6* 12.1* 15.3* 14.8*  HGB 7.9* 8.5* 9.0* 9.9* 9.2*  HCT 25.3* 27.1* 28.0* 31.7* 28.1*  MCV 83.2 83.1 81.9 83.2 80.1  PLT 532* 498* 520* 483* 461*    Basic Metabolic Panel: Recent Labs  Lab 08/06/22 0456 08/07/22 0428 08/08/22 0500 08/09/22 1113 08/10/22 0522 08/11/22 1052 08/12/22 0241  NA 136   < > 138 138 140 142 142  K 4.2   < > 4.2 3.9 3.8 3.3* 4.2  CL 102   < > 104 106 107 107 112*  CO2 28   < > '29 29 28 25 25  '$ GLUCOSE 124*   < > 135* 153* 179* 204* 127*  BUN 35*   < > 25* '22 23 22 23  '$ CREATININE 1.38*   < > 0.80 0.73 0.72 0.64 0.56  CALCIUM 8.0*   < > 7.8* 7.4* 7.4* 7.2* 7.1*  MG 2.4  --   --   --   --   --  1.8  PHOS 3.1  --  1.5*  --   --   --  1.3*   < > = values in this interval not displayed.    GFR: Estimated Creatinine Clearance: 68.3 mL/min (by C-G formula based on SCr of 0.56 mg/dL). Liver Function Tests: Recent Labs  Lab 08/06/22 0456 08/06/22 1549 08/07/22 0428 08/08/22 0500  AST 18  --  20  --   ALT 21  --  20  --   ALKPHOS 49  --  49  --   BILITOT 0.4  --  0.3  --   PROT 6.0* 6.1* 6.0*  --   ALBUMIN 2.3*   --  2.2* 2.1*    No results for input(s): "LIPASE", "AMYLASE" in the last 168 hours. No results for input(s): "AMMONIA" in the last 168 hours. Coagulation Profile: Recent Labs  Lab 08/06/22 0456  INR 1.3*    Cardiac Enzymes: No results for input(s): "CKTOTAL", "CKMB", "CKMBINDEX", "TROPONINI" in the last 168 hours. BNP (last 3 results) No results for input(s): "PROBNP" in the last 8760 hours. HbA1C: No results for input(s): "HGBA1C" in the last 72 hours. CBG: No results for input(s): "GLUCAP" in the last 168 hours.  Lipid Profile: No results for input(s): "CHOL", "HDL", "LDLCALC", "TRIG", "CHOLHDL", "LDLDIRECT" in the last 72 hours. Thyroid Function Tests: No results for input(s): "TSH", "T4TOTAL", "FREET4", "T3FREE", "THYROIDAB" in the last 72 hours. Anemia Panel: No results for input(s): "VITAMINB12", "FOLATE", "  FERRITIN", "TIBC", "IRON", "RETICCTPCT" in the last 72 hours. Sepsis Labs: Recent Labs  Lab 08/06/22 0456 08/07/22 0428 08/08/22 0500  PROCALCITON 0.81 0.41 0.18  LATICACIDVEN 0.7 1.1  --      Recent Results (from the past 240 hour(s))  SARS Coronavirus 2 by RT PCR (hospital order, performed in [facility] hospital lab) *cepheid single result test* Anterior Nasal Swab     Status: None   Collection Time: 08/02/22  5:51 PM   Specimen: Anterior Nasal Swab  Result Value Ref Range Status   SARS Coronavirus 2 by RT PCR NEGATIVE NEGATIVE Final    Comment: (NOTE) SARS-CoV-2 target nucleic acids are NOT DETECTED.  The SARS-CoV-2 RNA is generally detectable in upper and lower respiratory specimens during the acute phase of infection. The lowest concentration of SARS-CoV-2 viral copies this assay can detect is 250 copies / mL. A negative result does not preclude SARS-CoV-2 infection and should not be used as the sole basis for treatment or other patient management decisions.  A negative result may occur with improper specimen collection / handling, submission of  specimen other than nasopharyngeal swab, presence of viral mutation(s) within the areas targeted by this assay, and inadequate number of viral copies (<250 copies / mL). A negative result must be combined with clinical observations, patient history, and epidemiological information.  Fact Sheet for Patients:   https://www.patel.info/  Fact Sheet for Healthcare Providers: https://hall.com/  This test is not yet approved or  cleared by the [place] FDA and has been authorized for detection and/or diagnosis of SARS-CoV-2 by FDA under an Emergency Use Authorization (EUA).  This EUA will remain in effect (meaning this test can be used) for the duration of the COVID-19 declaration under Section 564(b)(1) of the Act, 21 U.S.C. section 360bbb-3(b)(1), unless the authorization is terminated or revoked sooner.  Performed at [facility], [place] [address], [place], [place] [address]   Gram stain     Status: None   Collection Time: 08/03/22 12:13 PM   Specimen: PATH Cytology Pleural fluid  Result Value Ref Range Status   Specimen Description   Final    FLUID Performed at [facility] [address], [place], [place] [address]    Special Requests   Final    NONE Performed at [facility], [place] [address], [place], [place] [address]    Gram Stain   Final    NO ORGANISMS SEEN FEW WBC PRESENT, PREDOMINANTLY PMN Performed at [facility] [address], [place], [place] [address]    Report Status 08/03/2022 FINAL  Final  Culture, body fluid w Gram Stain-bottle     Status: Abnormal   Collection Time: 08/03/22  6:05 PM   Specimen: Pleura  Result Value Ref Range Status   Specimen Description PLEURAL FLUID  Final   Special Requests   Final    BOTTLES DRAWN AEROBIC AND ANAEROBIC Blood Culture results may not be optimal due to an excessive volume of blood received in culture bottles    Gram Stain   Final    ABUNDANT GRAM POSITIVE COCCI IN CLUSTERS RARE WBC PRESENT, PREDOMINANTLY PMN IN BOTH AEROBIC AND ANAEROBIC BOTTLES Performed at [facility], [place] [address], [place], [place] [address]    Culture STAPHYLOCOCCUS AUREUS (A)  Final   Report Status 08/06/2022 FINAL  Final   Organism ID, Bacteria STAPHYLOCOCCUS AUREUS  Final      Susceptibility   Staphylococcus aureus - MIC*  CIPROFLOXACIN 1 SENSITIVE Sensitive     ERYTHROMYCIN >=8 RESISTANT Resistant     GENTAMICIN <=0.5 SENSITIVE Sensitive     OXACILLIN 0.5 SENSITIVE Sensitive     TETRACYCLINE <=1 SENSITIVE Sensitive     VANCOMYCIN 1 SENSITIVE Sensitive     TRIMETH/SULFA <=10 SENSITIVE Sensitive     CLINDAMYCIN <=0.25 SENSITIVE Sensitive     RIFAMPIN <=0.5 SENSITIVE Sensitive     Inducible Clindamycin NEGATIVE Sensitive     * STAPHYLOCOCCUS AUREUS  MRSA Next Gen by PCR, Nasal     Status: None   Collection Time: 08/04/22 10:38 PM   Specimen: Nasal Mucosa; Nasal Swab  Result Value Ref Range Status   MRSA by PCR Next Gen NOT DETECTED NOT DETECTED Final    Comment: (NOTE) The GeneXpert MRSA Assay (FDA approved for NASAL specimens only), is one component of a comprehensive MRSA colonization surveillance program. It is not intended to diagnose MRSA infection nor to guide or monitor treatment for MRSA infections. Test performance is not FDA approved in patients less than 68 years old. Performed at [facility], [place] [address] [place], [place] [address]   Body fluid culture w Gram Stain     Status: None   Collection Time: 08/06/22 12:31 PM   Specimen: Pleural Fluid  Result Value Ref Range Status   Specimen Description   Final    PLEURAL Performed at [facility] [address], [place], [place] [address]    Special Requests   Final    NONE Performed at [facility], [place] [address], [place], [place] [address]    Gram Stain   Final    FEW WBC  PRESENT, PREDOMINANTLY PMN NO ORGANISMS SEEN Performed at [facility], [place] [address] [place], [place] [address]    Culture RARE STAPHYLOCOCCUS AUREUS  Final   Report Status 08/09/2022 FINAL  Final   Organism ID, Bacteria STAPHYLOCOCCUS AUREUS  Final      Susceptibility   Staphylococcus aureus - MIC*    CIPROFLOXACIN 1 SENSITIVE Sensitive     ERYTHROMYCIN >=8 RESISTANT Resistant     GENTAMICIN <=0.5 SENSITIVE Sensitive     OXACILLIN 0.5 SENSITIVE Sensitive     TETRACYCLINE <=1 SENSITIVE Sensitive     VANCOMYCIN <=0.5 SENSITIVE Sensitive     TRIMETH/SULFA <=10 SENSITIVE Sensitive     CLINDAMYCIN <=0.25 SENSITIVE Sensitive     RIFAMPIN <=0.5 SENSITIVE Sensitive     Inducible Clindamycin NEGATIVE Sensitive     * RARE STAPHYLOCOCCUS AUREUS         Radiology Studies: DG CHEST PORT 1 VIEW  Result Date: 08/12/2022 CLINICAL DATA:  [id number] with right hydropneumothorax, left pleural effusion. EXAM: PORTABLE CHEST 1 VIEW COMPARISON:  Portable chest yesterday at 7:28 a.m. FINDINGS: 4:29 a.m. Pigtail chest tube with tip in the medial right chest base is similar in positioning. Moderate to large bilateral pleural effusions, on the left layering posteriorly without pneumothorax, on the right again with loculated hydropneumothorax and extrapleural air again noted laterally. The right hydropneumothorax is estimated to occupy much as 50% of the right chest volume with the hydro component predominating. There is overlying consolidation or atelectasis in the lower lung fields. The overall aeration pattern seems unchanged. There is cardiomegaly with mild central vascular prominence. No overt edema is seen. Stable mediastinum. Again noted is a destructive lesion of the lateral right fifth rib likely metastasis. The patient is rotated to the right. IMPRESSION: 1. No significant change in  the appearance of the chest since yesterday's study. 2. The right hydropneumothorax is estimated to occupy much as  50% of the right chest volume with the hydro component predominating. 3. There is overlying consolidation or atelectasis in the lower lung fields with moderate-to-large layering left effusion. 4. Cardiomegaly with mild central vascular prominence. Electronically Signed   By: Telford Nab M.D.   On: 08/12/2022 06:03   DG Chest Port 1 View  Result Date: 08/11/2022 CLINICAL DATA:  Pleural effusion, metastatic breast carcinoma EXAM: PORTABLE CHEST 1 VIEW COMPARISON:  Previous studies including the examination of 08/10/2022 FINDINGS: Apparent shift of mediastinum to the right may be due to rotation. Transverse diameter of heart is increased. Moderate to large bilateral pleural effusions are seen. There is interval decrease in right pleural effusion. Single right chest tube is seen. Central pulmonary vessels are less prominent. Evaluation of right mid and both lower lung fields for infiltrates is limited by the effusions. There is no pneumothorax. IMPRESSION: Moderate to large bilateral pleural effusions. There is interval decrease in right pleural effusion. Electronically Signed   By: Elmer Picker M.D.   On: 08/11/2022 09:29        Scheduled Meds:  anastrozole  1 mg Oral Daily   calcium carbonate  1 tablet Oral BID   Chlorhexidine Gluconate Cloth  6 each Topical Daily   enoxaparin (LOVENOX) injection  40 mg Subcutaneous Q24H   famotidine  20 mg Oral Daily   feeding supplement  237 mL Oral TID BM   hydrocortisone sod succinate (SOLU-CORTEF) inj  50 mg Intravenous Q6H   megestrol  40 mg Oral BID   metroNIDAZOLE   Topical Q1400   midodrine  10 mg Oral BID WC   mirtazapine  7.5 mg Oral QHS   polyethylene glycol  17 g Oral Daily   potassium & sodium phosphates  2 packet Oral Q6H   senna  2 tablet Oral BID   sodium chloride flush  10 mL Intrapleural Q8H   sodium chloride flush  3 mL Intravenous Q12H   Continuous Infusions:  sodium chloride     sodium chloride Stopped (08/06/22 1241)    sodium chloride 50 mL/hr at 08/12/22 0836    ceFAZolin (ANCEF) IV Stopped (08/12/22 0520)   sodium phosphate 45 mmol in dextrose 5 % 250 mL infusion 44 mL/hr at 08/12/22 0800     LOS: 8 days    Time spent: 35 minutes    Barb Merino, MD [facility] Pager [phone]

## 2022-08-12 NOTE — Progress Notes (Signed)
NAME:  [name], MRN:  [id number], DOB:  [birth year], LOS: 8 ADMISSION DATE:  08/02/2022, CONSULTATION DATE:  08/05/22 REFERRING MD:  Gershon Cull , CHIEF COMPLAINT:   Lethargy, ams   BRIEF  61 year old with metastatic breast cancer, hypertension with recurrent right-sided malignant effusion presenting with decreased appetite, weakness and lethargy.  She underwent thoracentesis on admission showing MSSA empyema.  She underwent emergent thoracentesis 11/8.  Required vasopressors, weaned off.   Pertinent  Medical History  HTN Breast cancer - R chest wall, contralateral breast (L. Mets to lung and bones.  Responding well to chemo/antiestrogen.   Failutre to thrive, weight loss, malignant cachexia  Malignant pleural effusion R   Home meds: abemaciclib, arimidex, calcium carbonate, vit D, Dilaudid '8mg'$  q6 prn  Mag oxide, mirtazapine 7.'5mg'$  qhs  Miralax, senna  Flagyl gel   Significant Hospital Events: Including procedures, antibiotic start and stop dates in addition to other pertinent events   03/23/2022: Right-sided thoracentesis: LDH 259.  White cells 207.  8% polymorphs. Malignant cells consistent with breast cancer present xxxxxxxxxxxxxxxxxx 08/02/2022 - admit MRSA PCR neg 08/03/2022: Right thoracentesis 800 mL and stopped due to pain LDH 439, white cell 7100/84% polymorphs > huge increase in white count and left shift. MSSA gorwing from pleural fluids 08/03/22 08/05/22 -PCCM consult and ICU tx Left groin CVL Left groin a-line  08/05/22 -last Narcan was at 4 AM.  Now completely awake on BiPAP.  Wants BiPAP off.  No respiratory distress.  Remains on vasopressors Levophed 9 mcg and vasopressin.  Diastolic is low at 40 with map of 71.  She has left femoral arterial line and central line.  She is oriented and communicating.  She remains afebrile.  White count is high.  She is on antibiotics vancomycin and cefepime. ECHO - ef65%. RSVP 45+., Mild to mod TR. Mild MR Family not in favor for  pleurx. Dr Alvy Bimler recommending 1 more thora 08/06/22 worsening effusion with ?tension physiology s/p emergent thora. Repeat pleural Cx showing staph 11/10 after extensive patient with family informed decision made for right pigtail catheter by IR 11/12 CT drainage 1070. S/p tPA, dornase 11/13 CT drainage 1L, s/p tPA, dornase  Interim History / Subjective:  Afebrile  1L from chest tube Phos replaced overnight  Pt reports she did not have a great night > was up a lot with labs, new IV insertion and CXR. Does report improvement in pain control.   Objective   Blood pressure 134/71, pulse (!) 102, temperature 98 F (36.7 C), temperature source Oral, resp. rate 20, height '5\' 6"'$  (1.676 m), weight 59.5 kg, SpO2 96 %.        Intake/Output Summary (Last 24 hours) at 08/12/2022 0731 Last data filed at 08/12/2022 9244 Gross per 24 hour  Intake 2598.19 ml  Output 1500 ml  Net 1098.19 ml   Filed Weights   08/10/22 0500 08/11/22 0419 08/12/22 0500  Weight: 63.6 kg 60.5 kg 59.5 kg    Exam: General: chronically ill appearing adult female lying in bed in NAD HEENT: MM pink/moist, anicteric, fair dentition  Neuro: AAOx4, speech clear, MAE, generalized weakness  CV: s1s2 RRR, no m/r/g PULM: non-labored at rest, clear on left, diminished on right, chest tube intact on right  GI: soft, bsx4 active  Extremities: warm/dry, 1-2+ BLE pitting edema  Skin: right chest wall lesion with dressing intact   Resolved Hospital Problem list    Malignant Right Pleural Effusion  MSSA Empyema -continue right chest tube, care per protocol  -  flush CT Q8  -follow chest tube output  -chest tube care per protocol, flush Q8 -follow chest tube output -will discuss possible 3rd dose of intrapleural fibrinolysis. She has trapped lung on the right, likely will always have some fluid in that space.  May need to re-culture to ensure clearance of MSSA.  Will review with MD.   Septic Shock, MSSA Empyema, (POA) S/p  thora 11/5, 11/8. Right chest tube placed by IR on 11/10. Off pressors, lines removed.  -continue cefazolin for MSSA  -stop midodrine  -discontinue midodrine -if tolerates stopping midodrine, wean hydrocortisone on 11/15 -reduce NS to 87m/hr  Paroxysmal Atrial Flutter on admission NSR 11/13 -tele monitoring   Chronic Cancer Related Pain -on opioids at home Chronic Depression on Remeron PTA   -pain regimen adjusted on 11/13, improved control -dilaudid '4mg'$  Q4, & PRN  -consider long acting scheduled meds, defer to primary   Severe cachexia, severe protein calorie malnutrition present on admission Dietitian on board.  Nutrition supplements -ensure TID  -continue megace  -bowel regimen with chronic narcotics  -encourage PO intake, pt ate well on 11/13   Chronic Anemia secondary to Critical Illness, Malignancy -follow CBC, stable   Metastatic Breast Cancer Followed by Dr. GAlvy Bimler-continue anastrozole / antiestrogen therapy  -hold abemaciclib   Goals of Care -DNR after discussion with Dr. GAlvy Bimler -Palliative Care signed off  -guarded prognosis with co-morbidities, poor nutrition    Best Practice (right click and "Reselect all SmartList Selections" daily)  Diet/type: Regular consistency (see orders)  DVT prophylaxis: other GI prophylaxis: PEPCID oral Lines: N/A  Foley:  PUREWICK Code Status:  DNR  TRH Primary. PCCM will follow for chest tube management. SDU status.   Signature:   BNoe Gens MSN, APRN, NP-C, AGACNP-BC [facility] 08/12/2022, 7:31 AM   Please see [email or web address] for pager details.   From 7A-7P if no response, please call [phone] After hours, please call ELink [phone]

## 2022-08-13 ENCOUNTER — Inpatient Hospital Stay (HOSPITAL_COMMUNITY): Payer: BC Managed Care – PPO

## 2022-08-13 DIAGNOSIS — J910 Malignant pleural effusion: Secondary | ICD-10-CM

## 2022-08-13 DIAGNOSIS — J90 Pleural effusion, not elsewhere classified: Secondary | ICD-10-CM | POA: Diagnosis not present

## 2022-08-13 DIAGNOSIS — Z4682 Encounter for fitting and adjustment of non-vascular catheter: Secondary | ICD-10-CM

## 2022-08-13 DIAGNOSIS — C50911 Malignant neoplasm of unspecified site of right female breast: Secondary | ICD-10-CM | POA: Diagnosis not present

## 2022-08-13 DIAGNOSIS — J869 Pyothorax without fistula: Secondary | ICD-10-CM | POA: Diagnosis not present

## 2022-08-13 LAB — BASIC METABOLIC PANEL
Anion gap: 5 (ref 5–15)
BUN: 19 mg/dL (ref 8–23)
CO2: 27 mmol/L (ref 22–32)
Calcium: 6.6 mg/dL — ABNORMAL LOW (ref 8.9–10.3)
Chloride: 108 mmol/L (ref 98–111)
Creatinine, Ser: 0.65 mg/dL (ref 0.44–1.00)
GFR, Estimated: >60 mL/min (ref >60)
Glucose, Bld: 179 mg/dL — ABNORMAL HIGH (ref 70–99)
Potassium: 3.9 mmol/L (ref 3.5–5.1)
Sodium: 140 mmol/L (ref 135–145)

## 2022-08-13 LAB — GLUCOSE, CAPILLARY: Glucose-Capillary: 154 mg/dL — ABNORMAL HIGH (ref 70–99)

## 2022-08-13 MED ORDER — SODIUM CHLORIDE (PF) 0.9 % IJ SOLN
10.0000 mg | Freq: Once | INTRAMUSCULAR | Status: AC
  Administered 2022-08-13: 10 mg via INTRAPLEURAL
  Filled 2022-08-13: qty 10

## 2022-08-13 MED ORDER — MIDODRINE HCL 5 MG PO TABS
5.0000 mg | ORAL_TABLET | Freq: Two times a day (BID) | ORAL | Status: DC
  Administered 2022-08-14: 5 mg via ORAL
  Filled 2022-08-13: qty 1

## 2022-08-13 MED ORDER — HYDROCORTISONE SOD SUC (PF) 100 MG IJ SOLR
50.0000 mg | Freq: Two times a day (BID) | INTRAMUSCULAR | Status: DC
  Administered 2022-08-13 – 2022-08-14 (×2): 50 mg via INTRAVENOUS
  Filled 2022-08-13 (×2): qty 2

## 2022-08-13 MED ORDER — STERILE WATER FOR INJECTION IJ SOLN
5.0000 mg | Freq: Once | RESPIRATORY_TRACT | Status: AC
  Administered 2022-08-13: 5 mg via INTRAPLEURAL
  Filled 2022-08-13: qty 5

## 2022-08-13 MED ORDER — INSULIN ASPART 100 UNIT/ML IJ SOLN
2.0000 [IU] | INTRAMUSCULAR | Status: DC
  Administered 2022-08-13 – 2022-08-14 (×2): 4 [IU] via SUBCUTANEOUS
  Administered 2022-08-14: 2 [IU] via SUBCUTANEOUS
  Administered 2022-08-14: 4 [IU] via SUBCUTANEOUS

## 2022-08-13 MED ORDER — MAGNESIUM HYDROXIDE 400 MG/5ML PO SUSP
15.0000 mL | Freq: Once | ORAL | Status: AC
  Administered 2022-08-13: 15 mL via ORAL
  Filled 2022-08-13: qty 30

## 2022-08-13 MED ORDER — BISACODYL 10 MG RE SUPP
10.0000 mg | Freq: Once | RECTAL | Status: DC
  Filled 2022-08-13: qty 1

## 2022-08-13 NOTE — Progress Notes (Signed)
PROGRESS NOTE    [name]  HFW:[id number] DOB: 1961/05/26 DOA: 08/02/2022 PCP: Heath Lark, MD    Brief Narrative:  61 year old female with neglected fungating metastatic breast cancer, hypertension, recurrent right-sided malignant pleural effusion who was initially admitted with decreased appetite, weakness and lethargy.  Initially underwent thoracentesis on admission that was growing MSSA.  Emergent thoracentesis 11/8.  11/4-admit to the hospital. 11/5-right-sided thoracentesis 800 mL removed, LDH 439, white cell 84%.  MSSA from the pleural fluid. 11/7-PCCM consulted and transferred to ICU, started on vasopressors with left groin central line.  Treated with BiPAP. 11/8-worsening pleural effusion, emergent thoracentesis done persistent staff. 11/10-pigtail catheter was placed and currently getting Lasix through the tube. 11/12 transfer to [facility] service.   Remains on pigtail catheter with tPA.  Remains on antibiotics. Pain control remains the issue. Debilitated and frail.   Assessment & Plan:   Recurrent malignant pleural effusion on the right side with MSSA empyema: Septic shock secondary to MSSA empyema present on admission, resolving. Respiratory distress secondary to right-sided empyema, resolving.  Currently remains on cefazolin for MSSA, still remains on hydrocortisone and midodrine.  Blood cultures negative so far. We will taper off hydrocortisone.  Continue midodrine. Fluid culture with MSSA, currently on cefazolin.  Continue. Currently has a pigtail catheter in place, repeat CT scan shows large loculated pleural effusion.   Large amount of drainage, continue to receive Lytics. Patient is likely not a candidate for VATS.  PCCM continues to follow-up.   Repeat cultures from fluid pending.  Transient A-fib/flutter on admission: Stable now.  Electrolytes: Magnesium and phosphorus replaced.  Recheck.  Chronic cancer related pain: On home opiate doses and also as needed  Dilaudid.  Patient remains on Remeron. Changed to Dilaudid 4 mg every 4 hours, some control over the pain but is still not adequate. Noted episode of unresponsiveness in the past, however her pain is not controlled.  Metastatic breast cancer on antiestrogen therapy.  Followed by her oncologist. Chronic anemia secondary to critical illness Severe protein calorie malnutrition Cancer cachexia Severe frailty and debility  Continue supportive care.  Oncology discussing goal of care.   Currently remains DNR. Hospice appropriate.  Patient is not agreeable.  She will need SNF for rehab.   DVT prophylaxis: enoxaparin (LOVENOX) injection 40 mg Start: 08/06/22 2200 Place and maintain sequential compression device Start: 08/06/22 1943   Code Status: DNR Family Communication: None at the bedside Disposition Plan: Status is: Inpatient Remains inpatient appropriate because: Severely ill on IV antibiotics and procedures     Consultants:  Critical care Palliative care Oncology  Procedures:  Multiple thoracentesis, pigtail catheter  Antimicrobials:  Cefazolin 11/8---   Subjective: Patient seen and examined.  No overnight events.  Motivated to walk.  She was asking me to make sure she can get out of the bed and walk today.  Pain is controlled.  Still occasionally using IV pain medications.  Objective: Vitals:   08/13/22 0356 08/13/22 0400 08/13/22 0500 08/13/22 0700  BP:  135/81 130/80 120/69  Pulse:  89 90 84  Resp:  16 (!) 30 18  Temp: 97.6 F (36.4 C)     TempSrc: Oral     SpO2:  97% 95% 100%  Weight:      Height:        Intake/Output Summary (Last 24 hours) at 08/13/2022 0942 Last data filed at 08/13/2022 0600 Gross per 24 hour  Intake 1538.91 ml  Output 1140 ml  Net 398.91 ml    Danley Danker  Weights   08/10/22 0500 08/11/22 0419 08/12/22 0500  Weight: 63.6 kg 60.5 kg 59.5 kg    Examination:  General exam: Appears calm and comfortable . Frail and debilitated.   Chronically sick looking. On room air.  Not in any distress.  Cachectic. Respiratory system: On room air.  Not in obvious distress.  Poor air entry on the right side. Chest tube with serous sanguinous drainage.  Free-flowing. Patient has extensive skin ulcerations, fungating breast mass on the right side.  Tumor on the skin. Cardiovascular system: S1 & S2 heard, RRR.  Gastrointestinal system: Soft.  Nondistended.  Bowel sound present.   Central nervous system: Alert and oriented. No focal neurological deficits.  Generalized weakness. Extremities: Symmetric 5 x 5 power.  Lymphedema right hand. 2+ pedal edema both legs.    Data Reviewed: I have personally reviewed following labs and imaging studies  CBC: Recent Labs  Lab 08/08/22 0500 08/09/22 1113 08/10/22 0522 08/11/22 1052 08/12/22 0241  WBC 13.8* 11.6* 12.1* 15.3* 14.8*  HGB 7.9* 8.5* 9.0* 9.9* 9.2*  HCT 25.3* 27.1* 28.0* 31.7* 28.1*  MCV 83.2 83.1 81.9 83.2 80.1  PLT 532* 498* 520* 483* 461*    Basic Metabolic Panel: Recent Labs  Lab 08/08/22 0500 08/09/22 1113 08/10/22 0522 08/11/22 1052 08/12/22 0241 08/12/22 2034  NA 138 138 140 142 142  --   K 4.2 3.9 3.8 3.3* 4.2  --   CL 104 106 107 107 112*  --   CO2 '29 29 28 25 25  '$ --   GLUCOSE 135* 153* 179* 204* 127*  --   BUN 25* '22 23 22 23  '$ --   CREATININE 0.80 0.73 0.72 0.64 0.56  --   CALCIUM 7.8* 7.4* 7.4* 7.2* 7.1*  --   MG  --   --   --   --  1.8 2.5*  PHOS 1.5*  --   --   --  1.3* 1.9*    GFR: Estimated Creatinine Clearance: 68.3 mL/min (by C-G formula based on SCr of 0.56 mg/dL). Liver Function Tests: Recent Labs  Lab 08/06/22 1549 08/07/22 0428 08/08/22 0500  AST  --  20  --   ALT  --  20  --   ALKPHOS  --  49  --   BILITOT  --  0.3  --   PROT 6.1* 6.0*  --   ALBUMIN  --  2.2* 2.1*    No results for input(s): "LIPASE", "AMYLASE" in the last 168 hours. No results for input(s): "AMMONIA" in the last 168 hours. Coagulation Profile: No results  for input(s): "INR", "PROTIME" in the last 168 hours.  Cardiac Enzymes: No results for input(s): "CKTOTAL", "CKMB", "CKMBINDEX", "TROPONINI" in the last 168 hours. BNP (last 3 results) No results for input(s): "PROBNP" in the last 8760 hours. HbA1C: No results for input(s): "HGBA1C" in the last 72 hours. CBG: No results for input(s): "GLUCAP" in the last 168 hours.  Lipid Profile: No results for input(s): "CHOL", "HDL", "LDLCALC", "TRIG", "CHOLHDL", "LDLDIRECT" in the last 72 hours. Thyroid Function Tests: No results for input(s): "TSH", "T4TOTAL", "FREET4", "T3FREE", "THYROIDAB" in the last 72 hours. Anemia Panel: No results for input(s): "VITAMINB12", "FOLATE", "FERRITIN", "TIBC", "IRON", "RETICCTPCT" in the last 72 hours. Sepsis Labs: Recent Labs  Lab 08/07/22 0428 08/08/22 0500  PROCALCITON 0.41 0.18  LATICACIDVEN 1.1  --      Recent Results (from the past 240 hour(s))  Gram stain     Status: None   Collection  Time: 08/03/22 12:13 PM   Specimen: PATH Cytology Pleural fluid  Result Value Ref Range Status   Specimen Description   Final    FLUID Performed at [facility] [address], [place], [place] [address]    Special Requests   Final    NONE Performed at [facility], [place] [address], [place], [place] [address]    Gram Stain   Final    NO ORGANISMS SEEN FEW WBC PRESENT, PREDOMINANTLY PMN Performed at [facility] [address] [place], [place] [address]    Report Status 08/03/2022 FINAL  Final  Culture, body fluid w Gram Stain-bottle     Status: Abnormal   Collection Time: 08/03/22  6:05 PM   Specimen: Pleura  Result Value Ref Range Status   Specimen Description PLEURAL FLUID  Final   Special Requests   Final    BOTTLES DRAWN AEROBIC AND ANAEROBIC Blood Culture results may not be optimal due to an excessive volume of blood received in culture bottles   Gram Stain   Final    ABUNDANT GRAM POSITIVE COCCI IN  CLUSTERS RARE WBC PRESENT, PREDOMINANTLY PMN IN BOTH AEROBIC AND ANAEROBIC BOTTLES Performed at [facility], [place] [address], [place], [place] [address]    Culture STAPHYLOCOCCUS AUREUS (A)  Final   Report Status 08/06/2022 FINAL  Final   Organism ID, Bacteria STAPHYLOCOCCUS AUREUS  Final      Susceptibility   Staphylococcus aureus - MIC*    CIPROFLOXACIN 1 SENSITIVE Sensitive     ERYTHROMYCIN >=8 RESISTANT Resistant     GENTAMICIN <=0.5 SENSITIVE Sensitive     OXACILLIN 0.5 SENSITIVE Sensitive     TETRACYCLINE <=1 SENSITIVE Sensitive     VANCOMYCIN 1 SENSITIVE Sensitive     TRIMETH/SULFA <=10 SENSITIVE Sensitive     CLINDAMYCIN <=0.25 SENSITIVE Sensitive     RIFAMPIN <=0.5 SENSITIVE Sensitive     Inducible Clindamycin NEGATIVE Sensitive     * STAPHYLOCOCCUS AUREUS  MRSA Next Gen by PCR, Nasal     Status: None   Collection Time: 08/04/22 10:38 PM   Specimen: Nasal Mucosa; Nasal Swab  Result Value Ref Range Status   MRSA by PCR Next Gen NOT DETECTED NOT DETECTED Final    Comment: (NOTE) The GeneXpert MRSA Assay (FDA approved for NASAL specimens only), is one component of a comprehensive MRSA colonization surveillance program. It is not intended to diagnose MRSA infection nor to guide or monitor treatment for MRSA infections. Test performance is not FDA approved in patients less than 1 years old. Performed at [facility], [place] [address] [place], [place] [address]   Body fluid culture w Gram Stain     Status: None   Collection Time: 08/06/22 12:31 PM   Specimen: Pleural Fluid  Result Value Ref Range Status   Specimen Description   Final    PLEURAL Performed at [place] [address], [place], [place] [address]    Special Requests   Final    NONE Performed at [facility], [place] [address], [place], [place] [address]    Gram Stain   Final    FEW WBC PRESENT, PREDOMINANTLY PMN NO ORGANISMS  SEEN Performed at [facility], [place] [address], [place], [place] [address]    Culture RARE STAPHYLOCOCCUS AUREUS  Final   Report Status 08/09/2022 FINAL  Final   Organism ID, Bacteria STAPHYLOCOCCUS AUREUS  Final  Susceptibility   Staphylococcus aureus - MIC*    CIPROFLOXACIN 1 SENSITIVE Sensitive     ERYTHROMYCIN >=8 RESISTANT Resistant     GENTAMICIN <=0.5 SENSITIVE Sensitive     OXACILLIN 0.5 SENSITIVE Sensitive     TETRACYCLINE <=1 SENSITIVE Sensitive     VANCOMYCIN <=0.5 SENSITIVE Sensitive     TRIMETH/SULFA <=10 SENSITIVE Sensitive     CLINDAMYCIN <=0.25 SENSITIVE Sensitive     RIFAMPIN <=0.5 SENSITIVE Sensitive     Inducible Clindamycin NEGATIVE Sensitive     * RARE STAPHYLOCOCCUS AUREUS  Body fluid culture w Gram Stain     Status: None (Preliminary result)   Collection Time: 08/12/22  2:44 PM   Specimen: Pleura; Body Fluid  Result Value Ref Range Status   Specimen Description   Final    PLEURAL RIGHT Performed at [facility], [address] [place], [place] [address]    Special Requests   Final    Immunocompromised Performed at [facility], [place] [address] [place], [place] [address]    Gram Stain   Final    FEW WBC PRESENT, PREDOMINANTLY MONONUCLEAR NO ORGANISMS SEEN Performed at [facility], [place] [address] [place], [place] [address]    Culture PENDING  Incomplete   Report Status PENDING  Incomplete         Radiology Studies: DG CHEST PORT 1 VIEW  Result Date: 08/13/2022 CLINICAL DATA:  Pleural effusion EXAM: PORTABLE CHEST 1 VIEW COMPARISON:  CXR 08/12/22 FINDINGS: Right-sided pigtail pleural drainage catheter in place, unchanged in appearance from prior exam. Redemonstrated is a large right-sided hydropneumothorax which overall appears similar to prior exam with unchanged appearance of the air component along the apical and lateral margins of the right lung and the fluid component layering at the right lung base.  The appearance of the left lung is also unchanged from prior exam with a persistent small layering pleural effusion. There is no new focal airspace opacity. Cardiac and mediastinal contours are unchanged from prior exam. No new displaced rib fracture. Visualized upper abdomen is unremarkable. IMPRESSION: 1. No significant change in appearance of the large right-sided hydropneumothorax with a pigtail pleural drainage catheter in place. 2.  Unchanged layering moderate sized left pleural effusion. Electronically Signed   By: Marin Roberts M.D.   On: 08/13/2022 07:21   DG CHEST PORT 1 VIEW  Result Date: 08/12/2022 CLINICAL DATA:  [id number] with right hydropneumothorax, left pleural effusion. EXAM: PORTABLE CHEST 1 VIEW COMPARISON:  Portable chest yesterday at 7:28 a.m. FINDINGS: 4:29 a.m. Pigtail chest tube with tip in the medial right chest base is similar in positioning. Moderate to large bilateral pleural effusions, on the left layering posteriorly without pneumothorax, on the right again with loculated hydropneumothorax and extrapleural air again noted laterally. The right hydropneumothorax is estimated to occupy much as 50% of the right chest volume with the hydro component predominating. There is overlying consolidation or atelectasis in the lower lung fields. The overall aeration pattern seems unchanged. There is cardiomegaly with mild central vascular prominence. No overt edema is seen. Stable mediastinum. Again noted is a destructive lesion of the lateral right fifth rib likely metastasis. The patient is rotated to the right. IMPRESSION: 1. No significant change in the appearance of the chest since yesterday's study. 2. The right hydropneumothorax is estimated to occupy much as 50% of the right chest volume with the hydro component predominating. 3. There is overlying consolidation or atelectasis in the lower lung fields with  moderate-to-large layering left effusion. 4. Cardiomegaly with mild central vascular  prominence. Electronically Signed   By: Telford Nab M.D.   On: 08/12/2022 06:03        Scheduled Meds:  anastrozole  1 mg Oral Daily   calcium carbonate  1 tablet Oral BID   Chlorhexidine Gluconate Cloth  6 each Topical Daily   enoxaparin (LOVENOX) injection  40 mg Subcutaneous Q24H   famotidine  20 mg Oral Daily   feeding supplement  237 mL Oral TID BM   hydrocortisone sod succinate (SOLU-CORTEF) inj  50 mg Intravenous Q6H   megestrol  40 mg Oral BID   metroNIDAZOLE   Topical Q1400   midodrine  10 mg Oral BID WC   mirtazapine  7.5 mg Oral QHS   polyethylene glycol  17 g Oral Daily   senna  2 tablet Oral BID   sodium chloride flush  10 mL Intrapleural Q8H   sodium chloride flush  3 mL Intravenous Q12H   Continuous Infusions:  sodium chloride     sodium chloride Stopped (08/06/22 1241)   sodium chloride 50 mL/hr at 08/12/22 1500    ceFAZolin (ANCEF) IV Stopped (08/13/22 0423)     LOS: 9 days    Time spent: 35 minutes    Barb Merino, MD [facility] Pager [phone]

## 2022-08-13 NOTE — TOC Progression Note (Signed)
Transition of Care [facility]) - Progression Note    Patient Details  Name: [name] MRN: [id number] Date of Birth: 15-Apr-1961  [facility] of [facility]) CM/SW Contact  Copeland Neisen, Juliann Pulse, RN Phone Number: 08/13/2022, 10:49 AM  Clinical Narrative: R chest tube. Pain control. Not appropriate to start fl2 or SNF search.Continue to monitor.      Expected Discharge Plan:  (TBD) Barriers to Discharge: Continued Medical Work up  Expected Discharge Plan and Services Expected Discharge Plan:  (TBD)   Discharge Planning Services: CM Consult   Living arrangements for the past 2 months: Apartment                                       Social Determinants of Health (SDOH) Interventions    Readmission Risk Interventions    08/06/2022   10:01 AM  Readmission Risk Prevention Plan  Medication Review (RN Care Manager) Complete  PCP or Specialist appointment within 3-5 days of discharge Complete  HRI or Home Care Consult Complete  SW Recovery Care/Counseling Consult Complete  Palliative Care Screening Not [place] Complete

## 2022-08-13 NOTE — Progress Notes (Signed)
NAME:  [name], MRN:  [id number], DOB:  1960/12/08, LOS: 9 ADMISSION DATE:  08/02/2022, CONSULTATION DATE:  08/05/22 REFERRING MD:  Gershon Cull , CHIEF COMPLAINT:   Lethargy, ams   BRIEF  61 year old with metastatic breast cancer, hypertension with recurrent right-sided malignant effusion presenting with decreased appetite, weakness and lethargy.  She underwent thoracentesis on admission showing MSSA empyema.  She underwent emergent thoracentesis 11/8.  Required vasopressors, weaned off.   Pertinent  Medical History  HTN Breast cancer - R chest wall, contralateral breast (L. Mets to lung and bones.  Responding well to chemo/antiestrogen.   Failutre to thrive, weight loss, malignant cachexia  Malignant pleural effusion R   Home meds: abemaciclib, arimidex, calcium carbonate, vit D, Dilaudid '8mg'$  q6 prn  Mag oxide, mirtazapine 7.'5mg'$  qhs  Miralax, senna  Flagyl gel   Significant Hospital Events: Including procedures, antibiotic start and stop dates in addition to other pertinent events   03/23/2022: Right-sided thoracentesis: LDH 259.  White cells 207.  8% polymorphs. Malignant cells consistent with breast cancer present 08/02/2022 - admit MRSA PCR neg 08/03/2022: Right thoracentesis 800 mL and stopped due to pain LDH 439, white cell 7100/84% polymorphs > huge increase in white count and left shift. MSSA gorwing from pleural fluids 08/03/22 08/05/22 -PCCM consult and ICU tx Left groin CVL Left groin a-line  08/05/22 -last Narcan was at 4 AM.  Now completely awake on BiPAP.  Wants BiPAP off.  No respiratory distress.  Remains on vasopressors Levophed 9 mcg and vasopressin.  Diastolic is low at 40 with map of 71.  She has left femoral arterial line and central line.  She is oriented and communicating.  She remains afebrile.  White count is high.  She is on antibiotics vancomycin and cefepime. ECHO - ef65%. RSVP 45+., Mild to mod TR. Mild MR Family not in favor for pleurx. Dr Alvy Bimler  recommending 1 more thora 08/06/22 worsening effusion with ?tension physiology s/p emergent thora. Repeat pleural Cx showing staph 11/10 after extensive patient with family informed decision made for right pigtail catheter by IR 11/12 CT drainage 1070. S/p tPA, dornase 11/13 CT drainage 1L, s/p tPA, dornase  Interim History / Subjective:  Today she denies complaints. She wants to try to walk today.  Objective   Blood pressure 120/69, pulse 84, temperature 97.6 F (36.4 C), temperature source Oral, resp. rate 18, height '5\' 6"'$  (1.676 m), weight 59.5 kg, SpO2 100 %.        Intake/Output Summary (Last 24 hours) at 08/13/2022 0754 Last data filed at 08/13/2022 0600 Gross per 24 hour  Intake 1648.63 ml  Output 1140 ml  Net 508.63 ml    Filed Weights   08/10/22 0500 08/11/22 0419 08/12/22 0500  Weight: 63.6 kg 60.5 kg 59.5 kg    Exam: General: chronically ill appearing woman lying in bed in NAD HEENT: [place]/AT, eyes anicteric  Neuro: awake, alert, moving all extremities  CV: S1S2, RRR PULM: mild tachypnea, no accessory muscle use.  Reduced basilar breath sounds, still having bloody output from chest tube GI: NT, distended Extremities: ++ edema, no cyanosis  Skin: right breast mass dressing intact  Chest tube output 390cc CXR personally reviewed> Ongoing bilateral pleural effusions, trapped lung laterally with hydropneumothorax.   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 Shock resolve.d -con't cefazolin, will require a 6 week coursr of antibiotics  (won't all have to be IV) -keep weaning off midodrine- now '5mg'$  BID; then can wean hydrocortisone. This may be helping her appetite.  Paroxysmal Atrial Flutter- present on admission NSR 11/13 -con't tele monitoring -monitor electrolytes and replete PRN  Chronic Cancer Related Pain -on opioids at home Chronic Depression on Remeron PTA   -con't pain meds PRN; does best with regular dosing rather than skipping doses, but had encephalopathy requiring narcan earlier this admission -consider long-acting pain meds; defer this decision to primary   Constipation -miralax and senna -milk of mag today -suppository of no BM by this afternoon  Severe cachexia, severe protein calorie malnutrition present on admission -encouraged frequent snacks and meals -Ensure -appreciate RD's input -megace  Chronic anemia secondary to critical illness, malignancy -serial CBCs; transfuse for Hb<7 or hemodynamically significant bleeding  Metastatic Breast Cancer- Followed by Dr. Alvy Bimler -continue anastrozole / antiestrogen therapy  -hold abemaciclib while hospitalized  -need to focus aggressively on rehabilitation and nutrition  Hyperglycemia due to steroids -adding SSI PRN -goal BG 140-180  Goals of Care -DNR after discussion with Dr. Alvy Bimler  -ongoing discussions -prognosis remains guarded with co-morbidities, poor nutrition, advanced cancer   Best Practice (right click and "Reselect all SmartList Selections" daily)  Diet/type: Regular consistency (see orders)  DVT prophylaxis: LMWH GI prophylaxis: PEPCID   Lines: N/A  Foley:  PUREWICK Code Status:  DNR   Signature:   Julian Hy, DO 08/13/22 5:16 PM [facility]

## 2022-08-13 NOTE — IPAL (Signed)
  Interdisciplinary Goals of Care Family Meeting   Date carried out: 08/13/2022  Location of the meeting: Bedside  Member's involved: Physician and Family Member or next of kin  Durable Power of Attorney or Loss adjuster, chartered: patient    Discussion: We discussed goals of care for [name] .  I met with [name] and her mother to discuss her ongoing care. We reviewed that at this time I cannot say how long she will require her chest tube. We have a repeat culture pending to try to determine if the pleural space has sterilized. She is not a candidate currently for a pleurX due to potential ongoing infection, but may need that at some point. I told her we should have a better idea in a few days of what to expect in terms of duration of needing her chest tube. She has trapped lung from prolonged pleural effusion and will have permanent lung impairment due to her effusion.   Her mother stopped me in the hallway to let me know that [name] today indicated that she is tired and doesn't like being poked and prodded. She doesn't know what to do since she seems to be improving. We discussed that if she ever tells [place] she is done or doesn't want to continue with aggressive treatment, we can change the focus of our care and support her with comfort as the goal. In the event that she has a major decline (heart attack, blood clot, severe infection) that may be a time we should reconsider our goals. At this point her mother is confused but feels that continuing aggressive care is probably the best course since she is improving slightly.   Code status: Full DNR  Disposition: Continue current acute care  Time spent for the meeting: 30 min.    Julian Hy, DO  08/13/2022, 4:45 PM

## 2022-08-13 NOTE — Procedures (Signed)
Pleural Fibrinolytic Administration Procedure Note  Saranda Legrande  [id number]  [birth year]  Date:08/13/22  Time:5:54 PM   Provider Performing:Brooke Moshe Cipro   Procedure: Pleural Fibrinolysis Subsequent day [phone]) 3rd dose  Indication(s) Fibrinolysis of complicated pleural effusion  Consent Risks of the procedure as well as the alternatives and risks of each were explained to the patient and/or caregiver.  Consent for the procedure was obtained and placed on chart   Anesthesia None   Time Out Verified patient identification, verified procedure, site/side was marked, verified correct patient position, special equipment/implants available, medications/allergies/relevant history reviewed, required imaging and test results available.   Sterile Technique Hand hygiene, gloves   Procedure Description Existing pleural catheter was cleaned and accessed in sterile manner.  '10mg'$  of tPA in 30cc of saline and '5mg'$  of dornase in 30cc of sterile water were injected into pleural space using existing pleural catheter.  Catheter will be clamped for 1 hour and then placed back to suction.  Patient to change positions frequently over 1 hr.    Complications/Tolerance None; patient tolerated the procedure well.   EBL None   Specimen(s) None     Kennieth Rad, MSN, AG-ACNP-BC [facility] 08/13/2022, 5:55 PM  See Amion for pager If no response to pager, please call PCCM consult pager After 7:00 pm call Elink

## 2022-08-13 NOTE — Progress Notes (Signed)
Pt refusing cardiac monitoring ,said "you can put them on but I will take them off anyway" central telemetry informed of reason pt was off monitor and NP informed of the same.

## 2022-08-13 NOTE — Progress Notes (Addendum)
Patient called out for pain medication.opportunity was taken to  re-educate pt on the importance of continued monitoring especially with the use of narcotics given her recent need for narcan after similar medication was given. Pt expressed she understood,agreed to have the monitor back on and pain addressed as prescribed.

## 2022-08-13 NOTE — Progress Notes (Signed)
Continued support provided for this patient and mother bedside. She is always so kind and receptive of me. She shares gratitude for the support.

## 2022-08-14 ENCOUNTER — Inpatient Hospital Stay (HOSPITAL_COMMUNITY): Payer: BC Managed Care – PPO

## 2022-08-14 DIAGNOSIS — J869 Pyothorax without fistula: Secondary | ICD-10-CM | POA: Diagnosis not present

## 2022-08-14 DIAGNOSIS — C50911 Malignant neoplasm of unspecified site of right female breast: Secondary | ICD-10-CM | POA: Diagnosis not present

## 2022-08-14 DIAGNOSIS — Z4682 Encounter for fitting and adjustment of non-vascular catheter: Secondary | ICD-10-CM | POA: Diagnosis not present

## 2022-08-14 DIAGNOSIS — R609 Edema, unspecified: Secondary | ICD-10-CM | POA: Diagnosis not present

## 2022-08-14 DIAGNOSIS — J910 Malignant pleural effusion: Secondary | ICD-10-CM | POA: Diagnosis not present

## 2022-08-14 DIAGNOSIS — J90 Pleural effusion, not elsewhere classified: Secondary | ICD-10-CM | POA: Diagnosis not present

## 2022-08-14 LAB — CBC
HCT: 28.3 % — ABNORMAL LOW (ref 36.0–46.0)
Hemoglobin: 9 g/dL — ABNORMAL LOW (ref 12.0–15.0)
MCH: 26.4 pg (ref 26.0–34.0)
MCHC: 31.8 g/dL (ref 30.0–36.0)
MCV: 83 fL (ref 80.0–100.0)
Platelets: 517 10*3/uL — ABNORMAL HIGH (ref 150–400)
RBC: 3.41 MIL/uL — ABNORMAL LOW (ref 3.87–5.11)
RDW: 16.8 % — ABNORMAL HIGH (ref 11.5–15.5)
WBC: 16 10*3/uL — ABNORMAL HIGH (ref 4.0–10.5)
nRBC: 0 % (ref 0.0–0.2)

## 2022-08-14 LAB — GLUCOSE, CAPILLARY
Glucose-Capillary: 124 mg/dL — ABNORMAL HIGH (ref 70–99)
Glucose-Capillary: 155 mg/dL — ABNORMAL HIGH (ref 70–99)
Glucose-Capillary: 128 mg/dL — ABNORMAL HIGH (ref 70–99)
Glucose-Capillary: 172 mg/dL — ABNORMAL HIGH (ref 70–99)
Glucose-Capillary: 146 mg/dL — ABNORMAL HIGH (ref 70–99)

## 2022-08-14 MED ORDER — HYDROCORTISONE SOD SUC (PF) 100 MG IJ SOLR
50.0000 mg | Freq: Every day | INTRAMUSCULAR | Status: DC
  Administered 2022-08-15 – 2022-08-18 (×4): 50 mg via INTRAVENOUS
  Filled 2022-08-14 (×3): qty 1
  Filled 2022-08-14: qty 2

## 2022-08-14 NOTE — Progress Notes (Signed)
Bilateral lower extremity venous duplex has been completed. Preliminary results can be found in CV Proc through chart review.   08/14/22 11:45 AM [name] RVT

## 2022-08-14 NOTE — Progress Notes (Signed)
Physical Therapy Treatment Patient Details Name: [name] MRN: [id number] DOB: [birth year] Today's Date: 08/14/2022   History of Present Illness s Groll is a 61 yo woman with a hx of metastatic breast cancer, HTN, admitted 11/4 with decreased appetite, poor po intake , weakness, lethargy,persistent pleural effusion despite thoracentesis, FTT, transfer to iCU 11/6 for decreased response and  low BP,  right CT placed .    PT Comments    Patient very cheerful, motivated to ambulate.  HR 106-138 , SPO2  88% , at times no Pleth reading, 100% on RA after resting in recliner.   Continue PT for  ambulation.  Recommendations for follow up therapy are one component of a multi-disciplinary discharge planning process, led by the attending physician.  Recommendations may be updated based on patient status, additional functional criteria and insurance authorization.  Follow Up Recommendations  Skilled nursing-short term rehab (<3 hours/day) Can patient physically be transported by private vehicle: No   Assistance Recommended at Discharge Frequent or constant Supervision/Assistance  Patient can return home with the following A lot of help with walking and/or transfers;A lot of help with bathing/dressing/bathroom;Assistance with cooking/housework;Assist for transportation   Equipment Recommendations  None recommended by PT    Recommendations for Other Services       Precautions / Restrictions Precautions Precautions: Fall Precaution Comments: Right CT , was  off Wall suction, if on wall suction check with RN to take off wall suction, on RA,     Mobility  Bed Mobility               General bed mobility comments: seated on bed edge with nursing    Transfers Overall transfer level: Needs assistance Equipment used: Rolling walker (2 wheels) Transfers: Sit to/from Stand Sit to Stand: Min assist, Mod assist           General transfer comment: extra effort to power up from  bed with min,  and mod from recliner    Ambulation/Gait Ambulation/Gait assistance: Min guard, +2 safety/equipment Gait Distance (Feet): 40 Feet (then 240) Assistive device: Rolling walker (2 wheels) Gait Pattern/deviations: Step-to pattern, Step-through pattern, Trunk flexed Gait velocity: decr     General Gait Details: gait slow, stops to catch her breath.   Stairs             Wheelchair Mobility    Modified Rankin (Stroke Patients Only)       Balance   Sitting-balance support: Bilateral upper extremity supported, Feet supported Sitting balance-Leahy Scale: Good Sitting balance - Comments: leans forward on UE's   Standing balance support: Bilateral upper extremity supported, During functional activity Standing balance-Leahy Scale: Fair Standing balance comment: stands  turns around and looks at staff, no balance loss                            Cognition Arousal/Alertness: Awake/alert Behavior During Therapy: WFL for tasks assessed/performed, Restless Overall Cognitive Status: Within Functional Limits for tasks assessed                                 General Comments: very cheerful, energetic        Exercises      General Comments        Pertinent Vitals/Pain Pain Assessment Faces Pain Scale: Hurts little more Pain Location: , right side Pain Descriptors / Indicators: Discomfort Pain Intervention(s): Monitored during session  Home Living                          Prior Function            PT Goals (current goals can now be found in the care plan section) Progress towards PT goals: Progressing toward goals    Frequency    Min 2X/week      PT Plan Current plan remains appropriate    Co-evaluation              AM-PAC PT "6 Clicks" Mobility   Outcome Measure  Help needed turning from your back to your side while in a flat bed without using bedrails?: A Little Help needed moving from lying  on your back to sitting on the side of a flat bed without using bedrails?: A Little Help needed moving to and from a bed to a chair (including a wheelchair)?: A Little Help needed standing up from a chair using your arms (e.g., wheelchair or bedside chair)?: A Little Help needed to walk in hospital room?: A Little Help needed climbing 3-5 steps with a railing? : A Lot 6 Click Score: 17    End of Session   Activity Tolerance: Patient tolerated treatment well Patient left: in chair;with nursing/sitter in room Nurse Communication: Mobility status PT Visit Diagnosis: Unsteadiness on feet (R26.81);Adult, failure to thrive (R62.7);Muscle weakness (generalized) (M62.81);Difficulty in walking, not elsewhere classified (R26.2)     Time: 9937-1696 PT Time Calculation (min) (ACUTE ONLY): 20 min  Charges:  $Gait Training: 8-22 mins                     [place] Office [phone] Weekend [phone]    Claretha Cooper 08/14/2022, 3:07 PM

## 2022-08-14 NOTE — Progress Notes (Signed)
PROGRESS NOTE    [name]  IFO:[id number] DOB: May 12, 1961 DOA: 08/02/2022 PCP: Heath Lark, MD    Brief Narrative:  61 year old female with neglected fungating metastatic breast cancer, hypertension, recurrent right-sided malignant pleural effusion who was initially admitted with decreased appetite, weakness and lethargy.  Initially underwent thoracentesis on admission that was growing MSSA.  Emergent thoracentesis 11/8 due to septic shock.  11/4-admit to the hospital. 11/5-right-sided thoracentesis 800 mL removed, LDH 439, white cell 84%.  MSSA from the pleural fluid. 11/7-PCCM consulted and transferred to ICU, started on vasopressors with left groin central line.  Treated with BiPAP. 11/8-worsening pleural effusion, emergent thoracentesis done persistent staff. 11/10-pigtail catheter was placed and currently getting Lasix through the tube. 11/12 transfer to [facility] service.   Remains on pigtail catheter with tPA.  Remains on antibiotics. Debilitated and frail. Remains in the hospital, chest tube management, IV antibiotics.  Frailty and debility.   Assessment & Plan:   Recurrent malignant pleural effusion on the right side with MSSA empyema: Septic shock secondary to MSSA empyema present on admission, resolving. Respiratory distress secondary to right-sided empyema, resolving.  Currently remains on cefazolin for MSSA, still remains on hydrocortisone and midodrine.  Blood cultures negative so far. We will taper off hydrocortisone.  Changed to once daily tomorrow.  Continue midodrine. Fluid culture with MSSA, currently on cefazolin.  Continue. Repeat fluid cultures 11/14, no growth so far.  Pending. Currently has a pigtail catheter in place, chest x-ray with some improvement.   Large amount of drainage, continue to receive Lytics. Patient is likely not a candidate for VATS.  PCCM continues to follow-up.   Repeat cultures from fluid pending to make decision about keeping Pleurx  catheter.  Transient A-fib/flutter on admission: Stable now.  Sinus rhythm.  Electrolytes: Magnesium and phosphorus replaced.  Labs pending.  Chronic cancer related pain: On home opiate doses and also as needed Dilaudid.  Patient remains on Remeron. Changed to Dilaudid 4 mg every 4 hours, some control over the pain but is still not adequate. Noted episode of unresponsiveness in the past, however her pain is not controlled. Avoiding long-acting pain medications.  Metastatic breast cancer on antiestrogen therapy.  Followed by her oncologist. Chronic anemia secondary to critical illness Severe protein calorie malnutrition Cancer cachexia Severe frailty and debility  Continue supportive care.  Oncology discussing goal of care.   Currently remains DNR. Hospice appropriate.  Patient is not agreeable yet.  She will need SNF for rehab once chest tube plans are finalized.   DVT prophylaxis: enoxaparin (LOVENOX) injection 40 mg Start: 08/06/22 2200 Place and maintain sequential compression device Start: 08/06/22 1943   Code Status: DNR Family Communication: None at the bedside Disposition Plan: Status is: Inpatient Remains inpatient appropriate because: Severely ill on IV antibiotics and procedures     Consultants:  Critical care Palliative care Oncology  Procedures:  Multiple thoracentesis, pigtail catheter  Antimicrobials:  Cefazolin 11/8---   Subjective: Seen and examined.  No overnight events.  She is focused on needing help to get her out of the bed and walk in the hallway 2 times a day.  When talking about pain, she understands that we are on a lot of narcotics already.  She wants to use current doses of Dilaudid every 4 hours if nursing staff can provide it on time.  I asked patient whether she is improving and what can of help she looks forward for or if she is thinking more palliative approach, patient tries to stay away  from discussion.  Objective: Vitals:    08/14/22 0700 08/14/22 0800 08/14/22 0832 08/14/22 0900  BP: 114/66 (!) 116/59  126/78  Pulse: 99     Resp: 19 (!) 27  18  Temp:   98.4 F (36.9 C)   TempSrc:   Oral   SpO2: 100% 100%  97%  Weight:      Height:        Intake/Output Summary (Last 24 hours) at 08/14/2022 1049 Last data filed at 08/14/2022 4081 Gross per 24 hour  Intake 1522.47 ml  Output 1460 ml  Net 62.47 ml    Filed Weights   08/10/22 0500 08/11/22 0419 08/12/22 0500  Weight: 63.6 kg 60.5 kg 59.5 kg    Examination:  General exam: Appears calm and comfortable . Frail and debilitated. On room air.  Not in any distress.  Cachectic. Respiratory system: On room air.  Not in obvious distress.  Poor air entry on the right side. Chest tube with serous sanguinous drainage.  Free-flowing. Patient has extensive skin ulcerations, fungating breast mass on the right side.  Tumor on the skin. Cardiovascular system: S1 & S2 heard, RRR.  Gastrointestinal system: Soft.  Nondistended.  Bowel sound present.   Central nervous system: Alert and oriented. No focal neurological deficits.  Extremely debilitated. Extremities: Symmetric 5 x 5 power.  Lymphedema right hand. 2+ pedal edema both legs.    Data Reviewed: I have personally reviewed following labs and imaging studies  CBC: Recent Labs  Lab 08/09/22 1113 08/10/22 0522 08/11/22 1052 08/12/22 0241 08/13/22 2350  WBC 11.6* 12.1* 15.3* 14.8* 16.0*  HGB 8.5* 9.0* 9.9* 9.2* 9.0*  HCT 27.1* 28.0* 31.7* 28.1* 28.3*  MCV 83.1 81.9 83.2 80.1 83.0  PLT 498* 520* 483* 461* 517*    Basic Metabolic Panel: Recent Labs  Lab 08/08/22 0500 08/09/22 1113 08/10/22 0522 08/11/22 1052 08/12/22 0241 08/12/22 2034 08/13/22 1228  NA 138 138 140 142 142  --  140  K 4.2 3.9 3.8 3.3* 4.2  --  3.9  CL 104 106 107 107 112*  --  108  CO2 '29 29 28 25 25  '$ --  27  GLUCOSE 135* 153* 179* 204* 127*  --  179*  BUN 25* '22 23 22 23  '$ --  19  CREATININE 0.80 0.73 0.72 0.64 0.56  --   0.65  CALCIUM 7.8* 7.4* 7.4* 7.2* 7.1*  --  6.6*  MG  --   --   --   --  1.8 2.5*  --   PHOS 1.5*  --   --   --  1.3* 1.9*  --     GFR: Estimated Creatinine Clearance: 68.3 mL/min (by C-G formula based on SCr of 0.65 mg/dL). Liver Function Tests: Recent Labs  Lab 08/08/22 0500  ALBUMIN 2.1*    No results for input(s): "LIPASE", "AMYLASE" in the last 168 hours. No results for input(s): "AMMONIA" in the last 168 hours. Coagulation Profile: No results for input(s): "INR", "PROTIME" in the last 168 hours.  Cardiac Enzymes: No results for input(s): "CKTOTAL", "CKMB", "CKMBINDEX", "TROPONINI" in the last 168 hours. BNP (last 3 results) No results for input(s): "PROBNP" in the last 8760 hours. HbA1C: No results for input(s): "HGBA1C" in the last 72 hours. CBG: Recent Labs  Lab 08/13/22 2349 08/14/22 0310 08/14/22 0733  GLUCAP 154* 124* 155*    Lipid Profile: No results for input(s): "CHOL", "HDL", "LDLCALC", "TRIG", "CHOLHDL", "LDLDIRECT" in the last 72 hours. Thyroid Function Tests: No  results for input(s): "TSH", "T4TOTAL", "FREET4", "T3FREE", "THYROIDAB" in the last 72 hours. Anemia Panel: No results for input(s): "VITAMINB12", "FOLATE", "FERRITIN", "TIBC", "IRON", "RETICCTPCT" in the last 72 hours. Sepsis Labs: Recent Labs  Lab 08/08/22 0500  PROCALCITON 0.18     Recent Results (from the past 240 hour(s))  MRSA Next Gen by PCR, Nasal     Status: None   Collection Time: 08/04/22 10:38 PM   Specimen: Nasal Mucosa; Nasal Swab  Result Value Ref Range Status   MRSA by PCR Next Gen NOT DETECTED NOT DETECTED Final    Comment: (NOTE) The GeneXpert MRSA Assay (FDA approved for NASAL specimens only), is one component of a comprehensive MRSA colonization surveillance program. It is not intended to diagnose MRSA infection nor to guide or monitor treatment for MRSA infections. Test performance is not FDA approved in patients less than 29 years old. Performed at [facility], [place] [address], [place], [place] [address]   Body fluid culture w Gram Stain     Status: None   Collection Time: 08/06/22 12:31 PM   Specimen: Pleural Fluid  Result Value Ref Range Status   Specimen Description   Final    PLEURAL Performed at [place] [address], [place], [place] [address]    Special Requests   Final    NONE Performed at [facility], [place] [address], [place], [place] [address]    Gram Stain   Final    FEW WBC PRESENT, PREDOMINANTLY PMN NO ORGANISMS SEEN Performed at [facility], [place] [address] [place], [place] [address]    Culture RARE STAPHYLOCOCCUS AUREUS  Final   Report Status 08/09/2022 FINAL  Final   Organism ID, Bacteria STAPHYLOCOCCUS AUREUS  Final      Susceptibility   Staphylococcus aureus - MIC*    CIPROFLOXACIN 1 SENSITIVE Sensitive     ERYTHROMYCIN >=8 RESISTANT Resistant     GENTAMICIN <=0.5 SENSITIVE Sensitive     OXACILLIN 0.5 SENSITIVE Sensitive     TETRACYCLINE <=1 SENSITIVE Sensitive     VANCOMYCIN <=0.5 SENSITIVE Sensitive     TRIMETH/SULFA <=10 SENSITIVE Sensitive     CLINDAMYCIN <=0.25 SENSITIVE Sensitive     RIFAMPIN <=0.5 SENSITIVE Sensitive     Inducible Clindamycin NEGATIVE Sensitive     * RARE STAPHYLOCOCCUS AUREUS  Body fluid culture w Gram Stain     Status: None (Preliminary result)   Collection Time: 08/12/22  2:44 PM   Specimen: Pleura; Body Fluid  Result Value Ref Range Status   Specimen Description   Final    PLEURAL RIGHT Performed at [facility], [address], [place], [place] [address]    Special Requests   Final    Immunocompromised Performed at [facility], [place] [address] [place], [place] [address]    Gram Stain   Final    FEW WBC PRESENT, PREDOMINANTLY MONONUCLEAR NO ORGANISMS SEEN    Culture   Final    NO GROWTH 2 DAYS Performed at [place] [address] [place], [place] [address]     Report Status PENDING  Incomplete         Radiology Studies: DG CHEST PORT 1 VIEW  Result Date: 08/14/2022 CLINICAL DATA:  Right chest tube, bilateral pleural effusions EXAM: PORTABLE CHEST 1 VIEW COMPARISON:  Previous studies including the examination of 08/13/2022 FINDINGS: Transverse diameter of heart is increased. Right chest tube is noted with its  tip in the medial right lower lung field. Moderate bilateral pleural effusions are seen, more so on the left side. There is interval decrease in right pleural effusion. Sharp appearance of right cardiac margin may suggest loculated pneumothorax in the right lower lung field with interval decrease. There is no demonstrable apical pneumothorax. IMPRESSION: Cardiomegaly. Bilateral pleural effusions, more so on the left side. There is interval decrease in right pleural effusion and possible decrease in loculated pneumothorax in right lower lung field. There is no demonstrable apical pneumothorax. Electronically Signed   By: Elmer Picker M.D.   On: 08/14/2022 08:21   DG CHEST PORT 1 VIEW  Result Date: 08/13/2022 CLINICAL DATA:  Pleural effusion EXAM: PORTABLE CHEST 1 VIEW COMPARISON:  CXR 08/12/22 FINDINGS: Right-sided pigtail pleural drainage catheter in place, unchanged in appearance from prior exam. Redemonstrated is a large right-sided hydropneumothorax which overall appears similar to prior exam with unchanged appearance of the air component along the apical and lateral margins of the right lung and the fluid component layering at the right lung base. The appearance of the left lung is also unchanged from prior exam with a persistent small layering pleural effusion. There is no new focal airspace opacity. Cardiac and mediastinal contours are unchanged from prior exam. No new displaced rib fracture. Visualized upper abdomen is unremarkable. IMPRESSION: 1. No significant change in appearance of the large right-sided hydropneumothorax with a  pigtail pleural drainage catheter in place. 2.  Unchanged layering moderate sized left pleural effusion. Electronically Signed   By: Marin Roberts M.D.   On: 08/13/2022 07:21        Scheduled Meds:  anastrozole  1 mg Oral Daily   bisacodyl  10 mg Rectal Once   calcium carbonate  1 tablet Oral BID   Chlorhexidine Gluconate Cloth  6 each Topical Daily   enoxaparin (LOVENOX) injection  40 mg Subcutaneous Q24H   famotidine  20 mg Oral Daily   feeding supplement  237 mL Oral TID BM   hydrocortisone sod succinate (SOLU-CORTEF) inj  50 mg Intravenous Q12H   insulin aspart  2-6 Units Subcutaneous Q4H   megestrol  40 mg Oral BID   metroNIDAZOLE   Topical Q1400   midodrine  5 mg Oral BID WC   mirtazapine  7.5 mg Oral QHS   polyethylene glycol  17 g Oral Daily   senna  2 tablet Oral BID   sodium chloride flush  10 mL Intrapleural Q8H   sodium chloride flush  3 mL Intravenous Q12H   Continuous Infusions:  sodium chloride     sodium chloride 10 mL/hr at 08/14/22 0524    ceFAZolin (ANCEF) IV Stopped (08/14/22 0523)     LOS: 10 days    Time spent: 35 minutes    Barb Merino, MD [facility] Pager [phone]

## 2022-08-14 NOTE — Progress Notes (Signed)
Nutrition Follow-up  DOCUMENTATION CODES:  Severe malnutrition in context of chronic illness  INTERVENTION:  -Initiate 48hr calorie count today, 11/16. To be completed Monday 11/20, due to RD availability on weekend. -Continue regular diet. Encourage small frequent meals, snacks and supplements. Encourage calorie and protein dense foods -Continue Ensure Plus High Protein or Ensure Enlive TID (350kcal, 20g protein)  NUTRITION DIAGNOSIS:  Severe Malnutrition related to chronic illness as evidenced by percent weight loss, severe fat depletion, severe muscle depletion.  GOAL:  Patient will meet greater than or equal to 90% of their needs -progressing  MONITOR:  PO intake, Supplement acceptance, Diet advancement  REASON FOR ASSESSMENT:  Malnutrition Screening Tool   ASSESSMENT:  Pt is a 62yo F with PMH of metastatic breast cancer, and HTN who presents with decreased appetite, poor po intake, weakness, and lethargy. Required transfer to ICU for hypotension and lethargy.  Second 48hr calorie count ordered, pocket hung on door. RD unavailable on the weekend to calculate intake. Plan to complete calorie count on 11/20. Spoke with pt with family present yesterday afternoon. She reports she is trying to take bites and sips of things throughout the day. Family brought in milkshake yesterday that pt was still working on. Other take out bags of food also present. Discussed the importance of adequate nutrition. Pt continues to accept Ensure Plus HP 2-3x daily. Weight trending up from admission, some weight gain likely fluid related.  Date/Time Weight  08/12/22 0500 59.5 kg  08/11/22 0419 60.5 kg  08/10/22 0500 63.6 kg  08/08/22 0500 57.4 kg  08/07/22 0500 57 kg  08/05/22 0500 53.8 kg  08/04/22 2315 53.8 kg  08/04/22 0500 52 kg  08/03/22 0438 52 kg  08/02/22 2330 55 kg  08/02/22 1605 52.2 kg   Diet Order:   Diet Order             Diet regular Room service appropriate? Yes; Fluid  consistency: Thin  Diet effective now                  EDUCATION NEEDS:  Not appropriate for education at this time  Skin:  Skin Assessment: Reviewed RN Assessment  Last BM:  11/9  Height:  Ht Readings from Last 1 Encounters:  08/03/22 '5\' 6"'$  (1.676 m)   Weight:  Wt Readings from Last 1 Encounters:  08/12/22 59.5 kg    Ideal Body Weight:  59.1 kg  BMI:  Body mass index is 21.17 kg/m.  Estimated Nutritional Needs:  Kcal:  1675-2015kcal ([place] x 1.5-1.8, REE: 1119) Protein:  80-110g (1.5-2.0g/kg) Fluid:  1615-1867m (30-367mkg)  KaCandise BowensMS, RD, LDN, CNSC See AMiON for contact information

## 2022-08-14 NOTE — Progress Notes (Signed)
Pt refused CBG; educated by nurse and agreed to [facility] and refused the insulin; pt voiced " she's not comfortable with all this changes" and want to speak with her Dr.

## 2022-08-14 NOTE — Progress Notes (Signed)
NAME:  [name], MRN:  [id number], DOB:  1960-10-22, LOS: 47 ADMISSION DATE:  08/02/2022, CONSULTATION DATE:  08/05/22 REFERRING MD:  Gershon Cull , CHIEF COMPLAINT:   Lethargy, ams   BRIEF  61 year old with metastatic breast cancer, hypertension with recurrent right-sided malignant effusion presenting with decreased appetite, weakness and lethargy.  She underwent thoracentesis on admission showing MSSA empyema.  She underwent emergent thoracentesis 11/8.  Required vasopressors, weaned off.   Pertinent  Medical History  HTN Breast cancer - R chest wall, contralateral breast (L. Mets to lung and bones.  Responding well to chemo/antiestrogen.   Failutre to thrive, weight loss, malignant cachexia  Malignant pleural effusion R   Home meds: abemaciclib, arimidex, calcium carbonate, vit D, Dilaudid '8mg'$  q6 prn  Mag oxide, mirtazapine 7.'5mg'$  qhs  Miralax, senna  Flagyl gel   Significant Hospital Events: Including procedures, antibiotic start and stop dates in addition to other pertinent events   03/23/2022: Right-sided thoracentesis: LDH 259.  White cells 207.  8% polymorphs. Malignant cells consistent with breast cancer present 08/02/2022 - admit MRSA PCR neg 08/03/2022: Right thoracentesis 800 mL and stopped due to pain LDH 439, white cell 7100/84% polymorphs > huge increase in white count and left shift. MSSA gorwing from pleural fluids 08/03/22 08/05/22 -PCCM consult and ICU tx Left groin CVL Left groin a-line  08/05/22 -last Narcan was at 4 AM.  Now completely awake on BiPAP.  Wants BiPAP off.  No respiratory distress.  Remains on vasopressors Levophed 9 mcg and vasopressin.  Diastolic is low at 40 with map of 71.  She has left femoral arterial line and central line.  She is oriented and communicating.  She remains afebrile.  White count is high.  She is on antibiotics vancomycin and cefepime. ECHO - ef65%. RSVP 45+., Mild to mod TR. Mild MR Family not in favor for pleurx. Dr Alvy Bimler  recommending 1 more thora 08/06/22 worsening effusion with ?tension physiology s/p emergent thora. Repeat pleural Cx showing staph 11/10 after extensive patient with family informed decision made for right pigtail catheter by IR 11/12 CT drainage 1070. S/p tPA, dornase 11/13 CT drainage 1L, s/p tPA, dornase 11/15 tpa, dornase for decreased chest tube output  Interim History / Subjective:  Today she denies complaints.  She is excited because her cousin is coming to visit today.  Objective   Blood pressure 126/78, pulse 99, temperature 98.4 F (36.9 C), temperature source Oral, resp. rate 18, height '5\' 6"'$  (1.676 m), weight 59.5 kg, SpO2 97 %.        Intake/Output Summary (Last 24 hours) at 08/14/2022 1020 Last data filed at 08/14/2022 6160 Gross per 24 hour  Intake 1522.47 ml  Output 1460 ml  Net 62.47 ml    Filed Weights   08/10/22 0500 08/11/22 0419 08/12/22 0500  Weight: 63.6 kg 60.5 kg 59.5 kg    Exam: General: Frail appearing elderly woman sitting up in the chair no acute distress HEENT: [place]/AT, eyes anicteric Neuro: Awake, alert, answering questions appropriately.  Moving all extremities, but globally weak. CV: S1-S2, regular rate and rhythm PULM: Reduced basilar breath sounds bilaterally.  No conversational dyspnea.  Breathing comfortably on nasal cannula, mild tachypnea. GI: Soft, nontender, distention better Extremities: Ongoing bilateral lower extremity edema, no cyanosis Skin: Dressing over right breast mass  Chest tube output 960cc CXR personally reviewed> improving R effusion, trapped lung with likely hydropneumothorax laterally and apically  Repeat pleural fluid culture 11/14: NGTD  WBC 16 H/H 9/28.3 Platelets 517  Resolved Hospital Problem list    Malignant right pleural effusion  MSSA empyema Acute respiratory failure with hypoxia due to bilateral pleural effusion Trapped lung on the R w/ hydropneumothorax -Completed 3 doses of tPA and dornase with  good response.  Continue to monitor chest tube output until it slows. - AM CXR - Continue antibiotics.  Anticipate she will require 6-week course to sterilize the pleural space since I do not think the fluid will ever be fully evacuated.  Septic shock, MSSA Empyema, (POA). Shock resolve.d -con't cefazolin, will require a 6 week coursr of antibiotics (won't all have to be IV) -Discontinue midodrine - Concern decreasing hydrocortisone tomorrow if she is tolerating wean off midodrine.  This has been helping her appetite most likely, so we have been slow to wean.  Paroxysmal atrial flutter- present on admission.  Back in sinus. -Continue telemetry monitoring -Monitor electrolytes and replete as needed  Bilateral lower extremity edema - Dopplers to rule out DVTs  Chronic cancer related pain -on opioids at home Chronic depression on Remeron PTA   -Continue pain meds. May benefit for something long-acting.  Does best when her meds are scheduled.  Unfortunately she required Narcan earlier this admission.   Constipation -Daily MiraLAX and senna  Severe cachexia, severe protein calorie malnutrition present on admission -Continue to encourage frequent snacks and meals - Calorie count - Ensure - Continue Megace  Chronic anemia secondary to critical illness, malignancy -Continue to monitor, transfuse for hemoglobin less than 7 or hemodynamically significant bleeding  Metastatic breast cancer- Followed by Dr. Alvy Bimler -continue anastrozole / antiestrogen therapy  -hold abemaciclib while hospitalized  -Need to continue to focus aggressively on nutrition and rehabilitation  Hyperglycemia due to steroids -sliding scale insulin PRN  Goals of Care -DNR after discussion with Dr. Alvy Bimler  -ongoing discussions -prognosis remains guarded with co-morbidities, poor nutrition, advanced cancer   Best Practice (right click and "Reselect all SmartList Selections" daily)  Diet/type: Regular  consistency (see orders)  DVT prophylaxis: LMWH GI prophylaxis: PEPCID   Lines: N/A  Foley:  PUREWICK Code Status:  DNR   Signature:   Julian Hy, DO 08/14/22 10:55 AM [facility]

## 2022-08-14 NOTE — Progress Notes (Signed)
Kody Vigil   DOB:1961/06/20   DZ#:[id number]    ASSESSMENT & PLAN:  Metastatic breast cancer to the lung and bones While she responded very well to chemotherapy and antiestrogen therapy, she is not thriving due to progressive weight loss and weakness While hospitalized, I recommend we continue on antiestrogen therapy but hold abemaciclib Recent CT imaging showed continuous improvement in response to therapy despite being off abemaciclib She will continue anastrozole   Recurrent malignant pleural effusion with MSSA empyema I would defer management to pulmonologist/ICU team She would likely be on several weeks of IV antibiotics   malignant cachexia Failure to thrive Severe protein calorie malnutrition I encouraged the patient to continue frequent small meals I recommend repeat calorie count again   Cancer associated pain She had recent confusion episode/altered mental status due to narcotics Her pain control has improved when her pain medicine is on a scheduled basis I do not recommend schedule long acting narcotics due to her frail status and propensity to get confused   Generalized weakness Failure to thrive Multifactorial, related to progressive weight loss, muscle loss and deconditioning Recommend skilled nursing facility placement upon discharge However, this is too premature to arrange.  Her mother expressed understanding   Code Status With a progressive decline in empyema, I reviewed the CODE STATUS with the patient twice on November 9 Ultimately, she agreed with changing her CODE STATUS to DO NOT RESUSCITATE   Goals of care Improvement of weakness, infection and nutrition status   Discharge planning Unknown at this point I updated her mother and we will check on her again tomorrow  All questions were answered. The patient knows to call the clinic with any problems, questions or concerns.   The total time spent in the appointment was 30 minutes encounter with  patients including review of chart and various tests results, discussions about plan of care and coordination of care plan  Heath Lark, MD 08/14/2022 8:34 AM  Subjective:  I have reviewed documentation from the wrist team from yesterday.  Today, she felt okay.  Pain is reasonably controlled.  She denies constipation.  She has been afebrile. Her mother and I spoke over the phone.  Debar is not eating as much.  She wrote a very long letter asking various different questions including future placement in skilled nursing facility which I have addressed today  Objective:  Vitals:   08/14/22 0700 08/14/22 0800  BP: 114/66 (!) 116/59  Pulse: 99   Resp: 19 (!) 27  Temp:    SpO2: 100% 100%     Intake/Output Summary (Last 24 hours) at 08/14/2022 0834 Last data filed at 08/14/2022 3419 Gross per 24 hour  Intake 1532.47 ml  Output 1460 ml  Net 72.47 ml    GENERAL:alert, no distress and comfortable NEURO: alert & oriented x 3 with fluent speech, no focal motor/sensory deficits   Labs:  Recent Labs    08/05/22 0458 08/06/22 0456 08/06/22 1549 08/07/22 0428 08/08/22 0500 08/09/22 1113 08/11/22 1052 08/12/22 0241 08/13/22 1228  NA 136 136  --  137 138   < > 142 142 140  K 4.0 4.2  --  4.2 4.2   < > 3.3* 4.2 3.9  CL 103 102  --  105 104   < > 107 112* 108  CO2 25 28  --  28 29   < > '25 25 27  '$ GLUCOSE 129* 124*  --  135* 135*   < > 204* 127* 179*  BUN  33* 35*  --  30* 25*   < > '22 23 19  '$ CREATININE 1.85* 1.38*  --  1.08* 0.80   < > 0.64 0.56 0.65  CALCIUM 7.1* 8.0*  --  7.7* 7.8*   < > 7.2* 7.1* 6.6*  GFRNONAA 30* 43*  --  58* >60   < > >60 >60 >60  PROT 6.3* 6.0* 6.1* 6.0*  --   --   --   --   --   ALBUMIN 2.4* 2.3*  --  2.2* 2.1*  --   --   --   --   AST 22 18  --  20  --   --   --   --   --   ALT 23 21  --  20  --   --   --   --   --   ALKPHOS 50 49  --  49  --   --   --   --   --   BILITOT 0.3 0.4  --  0.3  --   --   --   --   --    < > = values in this interval not  displayed.    Studies: I have reviewed the CXR DG CHEST PORT 1 VIEW  Result Date: 08/14/2022 CLINICAL DATA:  Right chest tube, bilateral pleural effusions EXAM: PORTABLE CHEST 1 VIEW COMPARISON:  Previous studies including the examination of 08/13/2022 FINDINGS: Transverse diameter of heart is increased. Right chest tube is noted with its tip in the medial right lower lung field. Moderate bilateral pleural effusions are seen, more so on the left side. There is interval decrease in right pleural effusion. Sharp appearance of right cardiac margin may suggest loculated pneumothorax in the right lower lung field with interval decrease. There is no demonstrable apical pneumothorax. IMPRESSION: Cardiomegaly. Bilateral pleural effusions, more so on the left side. There is interval decrease in right pleural effusion and possible decrease in loculated pneumothorax in right lower lung field. There is no demonstrable apical pneumothorax. Electronically Signed   By: Elmer Picker M.D.   On: 08/14/2022 08:21   DG CHEST PORT 1 VIEW  Result Date: 08/13/2022 CLINICAL DATA:  Pleural effusion EXAM: PORTABLE CHEST 1 VIEW COMPARISON:  CXR 08/12/22 FINDINGS: Right-sided pigtail pleural drainage catheter in place, unchanged in appearance from prior exam. Redemonstrated is a large right-sided hydropneumothorax which overall appears similar to prior exam with unchanged appearance of the air component along the apical and lateral margins of the right lung and the fluid component layering at the right lung base. The appearance of the left lung is also unchanged from prior exam with a persistent small layering pleural effusion. There is no new focal airspace opacity. Cardiac and mediastinal contours are unchanged from prior exam. No new displaced rib fracture. Visualized upper abdomen is unremarkable. IMPRESSION: 1. No significant change in appearance of the large right-sided hydropneumothorax with a pigtail pleural drainage  catheter in place. 2.  Unchanged layering moderate sized left pleural effusion. Electronically Signed   By: Marin Roberts M.D.   On: 08/13/2022 07:21   DG CHEST PORT 1 VIEW  Result Date: 08/12/2022 CLINICAL DATA:  [id number] with right hydropneumothorax, left pleural effusion. EXAM: PORTABLE CHEST 1 VIEW COMPARISON:  Portable chest yesterday at 7:28 a.m. FINDINGS: 4:29 a.m. Pigtail chest tube with tip in the medial right chest base is similar in positioning. Moderate to large bilateral pleural effusions, on the left layering posteriorly without pneumothorax, on  the right again with loculated hydropneumothorax and extrapleural air again noted laterally. The right hydropneumothorax is estimated to occupy much as 50% of the right chest volume with the hydro component predominating. There is overlying consolidation or atelectasis in the lower lung fields. The overall aeration pattern seems unchanged. There is cardiomegaly with mild central vascular prominence. No overt edema is seen. Stable mediastinum. Again noted is a destructive lesion of the lateral right fifth rib likely metastasis. The patient is rotated to the right. IMPRESSION: 1. No significant change in the appearance of the chest since yesterday's study. 2. The right hydropneumothorax is estimated to occupy much as 50% of the right chest volume with the hydro component predominating. 3. There is overlying consolidation or atelectasis in the lower lung fields with moderate-to-large layering left effusion. 4. Cardiomegaly with mild central vascular prominence. Electronically Signed   By: Telford Nab M.D.   On: 08/12/2022 06:03   DG Chest Port 1 View  Result Date: 08/11/2022 CLINICAL DATA:  Pleural effusion, metastatic breast carcinoma EXAM: PORTABLE CHEST 1 VIEW COMPARISON:  Previous studies including the examination of 08/10/2022 FINDINGS: Apparent shift of mediastinum to the right may be due to rotation. Transverse diameter of heart is increased.  Moderate to large bilateral pleural effusions are seen. There is interval decrease in right pleural effusion. Single right chest tube is seen. Central pulmonary vessels are less prominent. Evaluation of right mid and both lower lung fields for infiltrates is limited by the effusions. There is no pneumothorax. IMPRESSION: Moderate to large bilateral pleural effusions. There is interval decrease in right pleural effusion. Electronically Signed   By: Elmer Picker M.D.   On: 08/11/2022 09:29   CT CHEST WO CONTRAST  Result Date: 08/10/2022 CLINICAL DATA:  Metastatic breast cancer.  Empyema. * Tracking Code: BO * EXAM: CT CHEST WITHOUT CONTRAST TECHNIQUE: Multidetector CT imaging of the chest was performed following the standard protocol without IV contrast. RADIATION DOSE REDUCTION: This exam was performed according to the departmental dose-optimization program which includes automated exposure control, adjustment of the mA and/or kV according to patient size and/or use of iterative reconstruction technique. COMPARISON:  06/25/2022 FINDINGS: Cardiovascular: Mild atherosclerotic calcification of the aortic arch. Mediastinum/Nodes: There is hazy diffuse edema/infiltration of the mediastinal and subcutaneous adipose tissues compatible with third spacing of fluid. Lungs/Pleura: Large loculated right hydropneumothorax. Pigtail right pleural drainage catheter in place. IDA. Made only about 25% of the right lung is aerated. There is scattered atelectasis in the right lung. Moderate to large left pleural effusion encompassing about 1/3 of the left hemithoracic volume. No definite mass or nodule in the aerated portion of the left lung. Upper Abdomen: Stable small hypodense hepatic lesions, compatible with cysts. Stable benign cyst in the left kidney upper pole, not requiring further imaging workup. Musculoskeletal: The CT scan from 06/25/2022 showed enhancing lesions in the right chest wall and axilla. These are  substantially less conspicuous today although likely mainly from lack of IV contrast rather than resolution. A right axillary mass or lymph node measures 1.8 cm in short axis on image 52 series 2, formerly 1.9 cm. Bony destructive findings of the right fifth rib laterally as on image 62 series 5. Sclerotic focus possibly a healing fracture in the right eighth rib laterally. Extensive sclerosis in the sternum favoring metastatic disease over infection. Nearly complete central collapse of the T3 vertebral body with associated sclerosis and minimal posterior bony retropulsion unchanged from prior. IMPRESSION: 1. Large loculated right hydropneumothorax encompassing about 75%  of right hemithoracic volume. Pigtail right pleural drainage catheter in place. 2. Moderate to large left pleural effusion encompassing about 1/3 of the left hemithoracic volume. 3. Right axillary mass or lymph node measures 1.8 cm in short axis, formerly 1.9 cm. Other previously seen chest wall soft tissue lesions are less conspicuous on today's exam due to lack of IV contrast, but likely still present. 4. Bony destructive findings of the right fifth rib laterally compatible with metastatic disease. 5. Extensive sclerosis in the sternum favoring metastatic disease over infection. 6. Nearly complete central collapse of the T3 vertebral body with associated sclerosis and minimal posterior bony retropulsion, unchanged from prior. 7. Diffuse hazy edema/infiltration of the mediastinal and subcutaneous adipose tissues compatible with third spacing of fluid. 8. Aortic atherosclerosis. Aortic Atherosclerosis (ICD10-I70.0). Electronically Signed   By: Van Clines M.D.   On: 08/10/2022 10:39   DG Chest Port 1 View  Result Date: 08/10/2022 CLINICAL DATA:  Pleural effusion. Follow-up. Acute respiratory failure. EXAM: PORTABLE CHEST 1 VIEW COMPARISON:  08/08/2022 FINDINGS: There is a right-sided pigtail thoracostomy tube overlying the medial right  lower lung. Right-sided, locule hydropneumothorax is identified. No significant change in volume compared with the previous exam. Moderate left pleural effusion appears increased in volume. IMPRESSION: 1. No significant change in volume of right-sided hydropneumothorax. 2. Increase in volume of moderate left pleural effusion. 3. Stable position of right thoracostomy tube. Electronically Signed   By: Kerby Moors M.D.   On: 08/10/2022 09:07   DG Chest Port 1 View  Result Date: 08/09/2022 CLINICAL DATA:  Acute respiratory failure, right pleural effusion EXAM: PORTABLE CHEST 1 VIEW COMPARISON:  08/08/2022 FINDINGS: Interval placement of right basilar chest tube. Decreasing right pleural effusion. Moderate right effusion remains. No pneumothorax. Extensive airspace disease throughout the right lung. Left perihilar and lower lobe airspace disease with layering left effusion. Cardiomegaly. No acute bony abnormality. IMPRESSION: Interval placement of right chest tube with decreasing right pleural effusion and slight improved aeration within the right lung. Continued diffuse airspace disease on the right and in the left lower lung. Bilateral effusions. No pneumothorax. Electronically Signed   By: Rolm Baptise M.D.   On: 08/09/2022 08:12   CT PERC PLEURAL DRAIN W/INDWELL CATH W/IMG GUIDE  Result Date: 08/08/2022 INDICATION: 61 year old woman with right empyema presents to IR for chest tube placement EXAM: CT-guided right chest tube placement TECHNIQUE: Multidetector CT imaging of the chest was performed following the standard protocol without IV contrast. RADIATION DOSE REDUCTION: This exam was performed according to the departmental dose-optimization program which includes automated exposure control, adjustment of the mA and/or kV according to patient size and/or use of iterative reconstruction technique. MEDICATIONS: The patient is currently admitted to the hospital and receiving intravenous antibiotics. The  antibiotics were administered within an appropriate time frame prior to the initiation of the procedure. ANESTHESIA/SEDATION: Moderate (conscious) sedation was employed during this procedure. A total of Versed 2 mg and Fentanyl 100 mcg was administered intravenously by the radiology nurse. Total intra-service moderate Sedation Time: 14 minutes. The patient's level of consciousness and vital signs were monitored continuously by radiology nursing throughout the procedure under my direct supervision. COMPLICATIONS: None immediate. PROCEDURE: Informed written consent was obtained from the patient after a thorough discussion of the procedural risks, benefits and alternatives. All questions were addressed. Maximal Sterile Barrier Technique was utilized including caps, mask, sterile gowns, sterile gloves, sterile drape, hand hygiene and skin antiseptic. A timeout was performed prior to the initiation of the procedure.  Patient positioned left lateral decubitus on the procedure table. The right lateral chest wall skin prepped and draped in usual fashion. Following local lidocaine administration, right pleural space was accessed with 19 gauge Yueh needle utilizing CT guidance. Yueh catheter removed over 0.035 inch Amplatz guidewire, serial dilation performed, and 14 [place] multipurpose pigtail drain inserted. CT confirmed appropriate positioning of the drain within the right pleural space. The drain was connected to Pleur-Evac and secured to skin with suture. IMPRESSION: CT-guided right chest tube (14 [place]) as above. Electronically Signed   By: Miachel Roux M.D.   On: 08/08/2022 17:02   DG Chest Port 1 View  Result Date: 08/08/2022 CLINICAL DATA:  Follow-up pleural effusion. EXAM: PORTABLE CHEST 1 VIEW COMPARISON:  08/07/2022 FINDINGS: Exam detail is diminished due to patient positioning with significant rightward rotational artifact. The cardiomediastinal contours appear unchanged. There is a large right pleural  effusion which is unchanged in volume compared with the previous exam. There is significantly diminished aeration to the right lung with only a small amount of aerated lung in the right upper lobe. Small left pleural effusion is unchanged. IMPRESSION: 1. No change in large right pleural effusion with significantly diminished aeration to the right lung. Electronically Signed   By: Kerby Moors M.D.   On: 08/08/2022 07:05   DG Chest Port 1 View  Result Date: 08/07/2022 CLINICAL DATA:  Pleural effusion EXAM: PORTABLE CHEST 1 VIEW COMPARISON:  08/06/2022 FINDINGS: No significant change in rotated AP portable examination. Large right pleural effusion with near total atelectasis or consolidation of the right lung, as well as a small, layering left pleural effusion. Cardiomegaly, the cardiac borders largely obscured. IMPRESSION: No significant change in rotated AP portable examination. Large right pleural effusion with near total atelectasis or consolidation of the right lung, as well as a small, layering left pleural effusion. Electronically Signed   By: Delanna Ahmadi M.D.   On: 08/07/2022 09:17   DG Chest Port 1 View  Result Date: 08/06/2022 CLINICAL DATA:  S/P thoracentesis EXAM: PORTABLE CHEST 1 VIEW COMPARISON:  Same day chest x-ray. FINDINGS: Decreased but still large right pleural effusion. Small left pleural effusion. Bilateral overlying opacities. No visible pneumothorax on this semi erect radiograph. Cardiomediastinal silhouette is similar. Polyarticular degenerative change. IMPRESSION: 1. Decreased but still large right pleural effusion. No visible pneumothorax on this semi erect radiograph. 2. Small left pleural effusion 3. Bilateral overlying opacities. Electronically Signed   By: Margaretha Sheffield M.D.   On: 08/06/2022 15:50   DG Chest 1 View  Result Date: 08/06/2022 CLINICAL DATA:  Respiratory failure. EXAM: CHEST  1 VIEW COMPARISON:  Multiple recent chest x-rays. FINDINGS: Persistent complete  opacification of the right hemithorax due to a large pleural effusion. Slight shift of the heart and mediastinum to the left exaggerated by the rotation of the patient and thoracic scoliosis. Persistent left lower lobe atelectasis but no left pleural effusion. IMPRESSION: Persistent complete opacification of the right hemithorax. Electronically Signed   By: Marijo Sanes M.D.   On: 08/06/2022 11:48   DG Chest Port 1 View  Result Date: 08/05/2022 CLINICAL DATA:  Shortness of breath EXAM: PORTABLE CHEST 1 VIEW COMPARISON:  08/05/2022, 06/25/2022, 08/03/2022 FINDINGS: Complete opacification of right thorax without change. Subsegmental atelectasis left base. Partially obscured cardiomediastinal silhouette. IMPRESSION: Complete opacification of the right thorax without change, probably due to sizable pleural effusion. Subsegmental atelectasis left base Electronically Signed   By: Donavan Foil M.D.   On: 08/05/2022 23:33  ECHOCARDIOGRAM COMPLETE  Result Date: 08/05/2022    ECHOCARDIOGRAM REPORT   Patient Name:   OVAL MORALEZ Date of Exam: 08/05/2022 Medical Rec #:  [id number]       Height:       66.0 in Accession #:    [id number]      Weight:       118.6 lb Date of Birth:  13-Aug-1961        BSA:          1.602 m Patient Age:    61 years        BP:           97/44 mmHg Patient Gender: F               HR:           78 bpm. Exam Location:  Inpatient Procedure: 2D Echo, Cardiac Doppler and Color Doppler Indications:    R94.31 Abnormal EKG  History:        Patient has no prior history of Echocardiogram examinations.                 Risk Factors:Hypertension. Metastatic breast cancer.  Sonographer:    [place] Referring Phys: [id number] AMRIT ADHIKARI  Sonographer Comments: Technically difficult study due to poor echo windows, suboptimal apical window and suboptimal subcostal window. Apical images off axis due to the hardened texture of the left breast. Could not turn patient. Wound dressings in subcostal region.  IMPRESSIONS  1. Left ventricular ejection fraction, by estimation, is 55 to 60%. The left ventricle has normal function. The left ventricle has no regional wall motion abnormalities. Left ventricular diastolic parameters were normal.  2. Right ventricular systolic function is normal. The right ventricular size is normal. Mildly increased right ventricular wall thickness. There is mildly elevated pulmonary artery systolic pressure. The estimated right ventricular systolic pressure is 30.8 mmHg.  3. Left atrial size was mildly dilated.  4. A small pericardial effusion is present. The pericardial effusion is circumferential. There is no evidence of cardiac tamponade. Large pleural effusion in the right lateral region.  5. 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 Tricuspid valve regurgitation is mild to moderate.  7. The aortic valve is tricuspid. Aortic valve regurgitation is not visualized. No aortic stenosis is present.  8. The inferior vena cava is normal in size with greater than 50% respiratory variability, suggesting right atrial pressure of 3 mmHg. FINDINGS  Left Ventricle: Left ventricular ejection fraction, by estimation, is 55 to 60%. The left ventricle has normal function. The left ventricle has no regional wall motion abnormalities. The left ventricular internal cavity size was normal in size. There is  no left ventricular hypertrophy. Left ventricular diastolic parameters were normal. Right Ventricle: The right ventricular size is normal. Mildly increased right ventricular wall thickness. Right ventricular systolic function is normal. There is mildly elevated pulmonary artery systolic pressure. The tricuspid regurgitant velocity is 3.19 m/s, and with an assumed right atrial pressure of 3 mmHg, the estimated right ventricular systolic pressure is 65.7 mmHg. Left Atrium:  Left atrial size was mildly dilated. Right Atrium: Right atrial size was normal in size. Pericardium: Complex septated fluid collection in the right pleural cavity, consider empyema or malignant effusion. A small pericardial effusion is present. The pericardial effusion is circumferential. There is no evidence of cardiac tamponade. 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 There is mild thickening of the mitral valve leaflet(s). Mild mitral valve regurgitation, with centrally-directed jet. Tricuspid Valve: The tricuspid valve is myxomatous. Tricuspid valve regurgitation is mild to moderate. Aortic Valve: The aortic valve is tricuspid. Aortic valve regurgitation is not visualized. No aortic stenosis is present. Pulmonic Valve: The pulmonic valve was normal in structure. Pulmonic valve regurgitation is mild. Aorta: The aortic root and ascending aorta are structurally normal, with no evidence of dilitation. Venous: IVC assessment for right atrial pressure unable to be performed due to mechanical ventilation. The inferior vena cava is normal in size with greater than 50% respiratory variability, suggesting right atrial pressure of 3 mmHg. IAS/Shunts: No atrial level shunt detected by color flow Doppler. Additional Comments: There is a large pleural effusion in the right lateral region.  LEFT VENTRICLE PLAX 2D LVIDd:         4.43 cm     Diastology LVIDs:         2.80 cm     LV e' medial:    5.08 cm/s LV PW:         1.03 cm     LV E/e' medial:  10.3 LV IVS:        0.87 cm     LV e' lateral:   6.46 cm/s LVOT diam:     2.10 cm     LV E/e' lateral: 8.1 LV SV:         44 LV SV Index:   28 LVOT Area:     3.46 cm  LV Volumes (MOD) LV vol d, MOD A4C: 57.5 ml LV vol s, MOD A4C: 18.7 ml LV SV MOD A4C:     57.5 ml RIGHT VENTRICLE             IVC RV S prime:     13.10 cm/s  IVC diam: 1.10 cm TAPSE (M-mode): 1.7 cm LEFT ATRIUM           Index        RIGHT  ATRIUM           Index LA diam:      3.20 cm 2.00 cm/m   RA Area:     14.20 cm LA Vol (A4C): 57.5 ml 35.90 ml/m  RA Volume:   35.90 ml  22.42 ml/m  AORTIC VALVE             PULMONIC VALVE LVOT Vmax:   75.15 cm/s  PR End Diast Vel: 2.14 msec LVOT Vmean:  45.850 cm/s LVOT VTI:    0.127 m  AORTA Ao Root diam: 2.60 cm Ao Asc diam:  2.70 cm MITRAL VALVE               TRICUSPID VALVE MV Area (PHT): 4.89 cm    TR Peak grad:   40.7 mmHg MV Decel Time: 155 msec    TR Vmax:        319.00 cm/s MV E velocity: 52.40 cm/s MV A velocity: 50.00 cm/s  SHUNTS MV E/A ratio:  1.05        Systemic VTI:  0.13 m                            Systemic Diam: 2.10 cm Dani Gobble Croitoru MD Electronically signed by Sanda Klein MD Signature Date/Time: 08/05/2022/12:38:14 PM    Final  DG CHEST PORT 1 VIEW  Result Date: 08/05/2022 CLINICAL DATA:  Right pleural effusion. EXAM: PORTABLE CHEST 1 VIEW COMPARISON:  08/05/2022 FINDINGS: Stable cardiomediastinal contours. Left lung is clear. Complete opacification of the right hemithorax is again noted and appears unchanged. Findings are favored to represent known large right pleural effusion. IMPRESSION: 1. No change in complete opacification of the right hemithorax which is presumed to represent large right pleural effusion. Electronically Signed   By: Kerby Moors M.D.   On: 08/05/2022 10:06   DG CHEST PORT 1 VIEW  Result Date: 08/05/2022 CLINICAL DATA:  Respiratory compromise EXAM: PORTABLE CHEST 1 VIEW COMPARISON:  08/03/2022 FINDINGS: Complete opacification of the right hemithorax, presumably reflecting a large pleural effusion. Left lung is clear. No pneumothorax. The heart is normal in size. IMPRESSION: Complete opacification of the right hemithorax, presumably reflecting a large pleural effusion. Electronically Signed   By: Julian Hy M.D.   On: 08/05/2022 00:41   US THORACENTESIS ASP PLEURAL SPACE W/IMG GUIDE  Result Date: 08/03/2022 INDICATION: Patient with history of  right-sided breast cancer, right pleural effusion. Request is made for diagnostic and therapeutic thoracentesis. EXAM: ULTRASOUND GUIDED RIGHT THORACENTESIS MEDICATIONS: 10 mL 1% lidocaine COMPLICATIONS: None immediate. PROCEDURE: An ultrasound guided thoracentesis was thoroughly discussed with the patient and questions answered. The benefits, risks, alternatives and complications were also discussed. The patient understands and wishes to proceed with the procedure. Written consent was obtained. Ultrasound was performed to localize and mark an adequate pocket of fluid in the right chest. The area was then prepped and draped in the normal sterile fashion. 1% Lidocaine was used for local anesthesia. Under ultrasound guidance a 6 Fr Safe-T-Centesis catheter was introduced. Thoracentesis was performed. The catheter was removed and a dressing applied. FINDINGS: A total of approximately 800 mL of yellow fluid was removed. Samples were sent to the laboratory as requested by the clinical team. IMPRESSION: Successful ultrasound guided right thoracentesis yielding 800 mL of pleural fluid. Read by: Brynda Greathouse PA-C Electronically Signed   By: Sandi Mariscal M.D.   On: 08/03/2022 13:06   DG Chest Port 1 View  Result Date: 08/03/2022 CLINICAL DATA:  Post right thoracentesis EXAM: PORTABLE CHEST 1 VIEW COMPARISON:  08/02/2022 FINDINGS: Continued complete opacification of the right hemithorax. No visible pneumothorax. No confluent opacity or effusion on the left. Heart is normal size. IMPRESSION: Continued complete opacification of the right hemithorax. No visible pneumothorax. Electronically Signed   By: Rolm Baptise M.D.   On: 08/03/2022 11:52   DG Chest Portable 1 View  Result Date: 08/02/2022 CLINICAL DATA:  Weakness and breast cancer.  Dehydration.  Fatigue. EXAM: PORTABLE CHEST 1 VIEW COMPARISON:  Chest CT 06/25/2022 FINDINGS: Increased right pleural effusion with complete opacification of the right hemithorax. There  is leftward shift of the trachea and mediastinal structures. No aerated right lung is seen. No focal airspace disease in the left lung. Left lung nodules on prior CT are not seen by radiograph. No visible pneumothorax. Osseous metastatic disease on CT not well demonstrated. IMPRESSION: Increased right pleural effusion with complete opacification of the right hemithorax. No aerated right lung is seen. Leftward shift of the trachea and mediastinal structures. Electronically Signed   By: Keith Rake M.D.   On: 08/02/2022 16:53

## 2022-08-15 ENCOUNTER — Inpatient Hospital Stay (HOSPITAL_COMMUNITY): Payer: BC Managed Care – PPO

## 2022-08-15 ENCOUNTER — Ambulatory Visit: Payer: BC Managed Care – PPO | Admitting: Hematology and Oncology

## 2022-08-15 ENCOUNTER — Other Ambulatory Visit: Payer: BC Managed Care – PPO

## 2022-08-15 DIAGNOSIS — J90 Pleural effusion, not elsewhere classified: Secondary | ICD-10-CM | POA: Diagnosis not present

## 2022-08-15 LAB — BASIC METABOLIC PANEL
Anion gap: 3 — ABNORMAL LOW (ref 5–15)
BUN: 24 mg/dL — ABNORMAL HIGH (ref 8–23)
CO2: 29 mmol/L (ref 22–32)
Calcium: 7 mg/dL — ABNORMAL LOW (ref 8.9–10.3)
Chloride: 109 mmol/L (ref 98–111)
Creatinine, Ser: 0.67 mg/dL (ref 0.44–1.00)
GFR, Estimated: >60 mL/min (ref >60)
Glucose, Bld: 184 mg/dL — ABNORMAL HIGH (ref 70–99)
Potassium: 4 mmol/L (ref 3.5–5.1)
Sodium: 141 mmol/L (ref 135–145)

## 2022-08-15 LAB — BODY FLUID CULTURE W GRAM STAIN: Culture: NO GROWTH

## 2022-08-15 MED ORDER — ADULT MULTIVITAMIN W/MINERALS CH
1.0000 | ORAL_TABLET | Freq: Every day | ORAL | Status: DC
  Administered 2022-08-15 – 2022-08-30 (×15): 1 via ORAL
  Filled 2022-08-15 (×16): qty 1

## 2022-08-15 NOTE — Progress Notes (Signed)
   08/15/22 1317  Assess: MEWS Score  Temp 98.3 F (36.8 C)  BP (!) 132/92  MAP (mmHg) 103  Pulse Rate (!) 117  Resp 18  Level of Consciousness Alert  SpO2 95 %  O2 Device Room Air  Assess: MEWS Score  MEWS Temp 0  MEWS Systolic 0  MEWS Pulse 2  MEWS RR 0  MEWS LOC 0  MEWS Score 2  MEWS Score Color Yellow  Assess: if the MEWS score is Yellow or Red  Were vital signs taken at a resting state? Yes  Focused Assessment No change from prior assessment  Does the patient meet 2 or more of the SIRS criteria? Yes  Does the patient have a confirmed or suspected source of infection? Yes  Provider and Rapid Response Notified? No  MEWS guidelines implemented *See Row Information* No, previously yellow, continue vital signs every 4 hours  Assess: SIRS CRITERIA  SIRS Temperature  0  SIRS Pulse 1  SIRS Respirations  0  SIRS WBC 1  SIRS Score Sum  2

## 2022-08-15 NOTE — Progress Notes (Addendum)
Patient refused blood glucose check due for 0000. A/O x4. Vitals stable. Chest tube intact-no leaks.

## 2022-08-15 NOTE — Progress Notes (Signed)
Calorie Count Note  48 hour calorie count ordered. To be completed Monday 11/20, due to RD availability on weekend.   Diet: regular Supplements: Ensure Plus High Protein po BID, each supplement provides 350 kcal and 20 grams of protein.   11/16: Breakfast: 25 kcals Lunch: From family: 500 kcals, 25g protein Dinner: 60 kcals, 3g protein Supplements: Refusing -0% intakes of Ensure  Total intake: 585 kcal (34% of minimum estimated needs)  28g protein (35% of minimum estimated needs)  Nutrition Dx: Severe Malnutrition related to chronic illness as evidenced by percent weight loss, severe fat depletion, severe muscle depletion.   Goal: Pt to meet >/= 90% of their estimated nutrition needs   Intervention:  -Ensure Plus High Protein po BID, each supplement provides 350 kcal and 20 grams of protein.  -Magic cup BID with meals, each supplement provides 290 kcal and 9 grams of protein  -Multivitamin with minerals daily -Encourage family to bring meals if pt prefers  Clayton Bibles, MS, RD, LDN Inpatient Clinical Dietitian Contact information available via Amion

## 2022-08-15 NOTE — Progress Notes (Signed)
[name]   DOB:02-23-1961   NL#:[id number]    ASSESSMENT & PLAN:   Metastatic breast cancer to the lung and bones While she responded very well to chemotherapy and antiestrogen therapy, she is not thriving due to progressive weight loss and weakness While hospitalized, I recommend we continue on antiestrogen therapy but hold abemaciclib Recent CT imaging showed continuous improvement in response to therapy despite being off abemaciclib She will continue anastrozole   Recurrent malignant pleural effusion with MSSA empyema I would defer management to pulmonologist/ICU team She would likely be on several weeks of IV antibiotics   malignant cachexia Failure to thrive Severe protein calorie malnutrition I encouraged the patient to continue frequent small meals I recommend repeat calorie count again   Cancer associated pain She had recent confusion episode/altered mental status due to narcotics Her pain control has improved when her pain medicine is on a scheduled basis I do not recommend schedule long acting narcotics due to her frail status and propensity to get confused   Generalized weakness Failure to thrive Multifactorial, related to progressive weight loss, muscle loss and deconditioning Recommend skilled nursing facility placement upon discharge However, this is too premature to arrange.  Her mother expressed understanding   Code Status With a progressive decline in empyema, I reviewed the CODE STATUS with the patient twice on November 9 Ultimately, she agreed with changing her CODE STATUS to DO NOT RESUSCITATE   Goals of care Improvement of weakness, infection and nutrition status   Discharge planning Unknown at this point I updated her mother  I told her I am not contributing much to her care over the past few days I will see her once a week starting next week Please call if questions arise   All questions were answered. The patient knows to call the clinic with  any problems, questions or concerns.   The total time spent in the appointment was 25 minutes encounter with patients including review of chart and various tests results, discussions about plan of care and coordination of care plan  Heath Lark, MD 08/15/2022 9:04 AM  Subjective:  She feels fine.  Pain is well controlled.  Denies constipation.  I have reviewed calorie count.  So far, she ate a good dinner yesterday  Objective:  Vitals:   08/15/22 0400 08/15/22 0500  BP: 108/64 98/62  Pulse: 100 97  Resp: 20 17  Temp: 98.3 F (36.8 C)   SpO2: 98% 100%     Intake/Output Summary (Last 24 hours) at 08/15/2022 0904 Last data filed at 08/15/2022 0700 Gross per 24 hour  Intake 527.21 ml  Output 1300 ml  Net -772.79 ml    GENERAL:alert, no distress and comfortable NEURO: alert & oriented x 3 with fluent speech, no focal motor/sensory deficits   Labs:  Recent Labs    08/05/22 0458 08/06/22 0456 08/06/22 1549 08/07/22 0428 08/08/22 0500 08/09/22 1113 08/11/22 1052 08/12/22 0241 08/13/22 1228  NA 136 136  --  137 138   < > 142 142 140  K 4.0 4.2  --  4.2 4.2   < > 3.3* 4.2 3.9  CL 103 102  --  105 104   < > 107 112* 108  CO2 25 28  --  28 29   < > '25 25 27  '$ GLUCOSE 129* 124*  --  135* 135*   < > 204* 127* 179*  BUN 33* 35*  --  30* 25*   < > 22 23 19  CREATININE 1.85* 1.38*  --  1.08* 0.80   < > 0.64 0.56 0.65  CALCIUM 7.1* 8.0*  --  7.7* 7.8*   < > 7.2* 7.1* 6.6*  GFRNONAA 30* 43*  --  58* >60   < > >60 >60 >60  PROT 6.3* 6.0* 6.1* 6.0*  --   --   --   --   --   ALBUMIN 2.4* 2.3*  --  2.2* 2.1*  --   --   --   --   AST 22 18  --  20  --   --   --   --   --   ALT 23 21  --  20  --   --   --   --   --   ALKPHOS 50 49  --  49  --   --   --   --   --   BILITOT 0.3 0.4  --  0.3  --   --   --   --   --    < > = values in this interval not displayed.    Studies:  VAS [place] LOWER EXTREMITY VENOUS (DVT)  Result Date: 08/14/2022  Lower Venous DVT Study Patient Name:  [name]  Date of Exam:   08/14/2022 Medical Rec #: [id number]        Accession #:    [id number] Date of Birth: [birth year]         Patient Gender: F Patient Age:   61 years Exam Location:  [facility] Procedure:      VAS [place] LOWER EXTREMITY VENOUS (DVT) Referring Phys: Noemi Chapel --------------------------------------------------------------------------------  Indications: Edema.  Risk Factors: None identified. Limitations: Poor ultrasound/tissue interface. Comparison Study: No prior studies. Performing Technologist: Oliver Hum RVT  Examination Guidelines: A complete evaluation includes B-mode imaging, spectral Doppler, color Doppler, and power Doppler as needed of all accessible portions of each vessel. Bilateral testing is considered an integral part of a complete examination. Limited examinations for reoccurring indications may be performed as noted. The reflux portion of the exam is performed with the patient in reverse Trendelenburg.  +---------+---------------+---------+-----------+----------+--------------+ RIGHT    CompressibilityPhasicitySpontaneityPropertiesThrombus Aging +---------+---------------+---------+-----------+----------+--------------+ CFV      Full           Yes      Yes                                 +---------+---------------+---------+-----------+----------+--------------+ SFJ      Full                                                        +---------+---------------+---------+-----------+----------+--------------+ FV Prox  Full                                                        +---------+---------------+---------+-----------+----------+--------------+ FV Mid   Full                                                        +---------+---------------+---------+-----------+----------+--------------+  FV DistalFull                                                         +---------+---------------+---------+-----------+----------+--------------+ PFV      Full                                                        +---------+---------------+---------+-----------+----------+--------------+ POP      Full           Yes      Yes                                 +---------+---------------+---------+-----------+----------+--------------+ PTV      Full                                                        +---------+---------------+---------+-----------+----------+--------------+ PERO     Full                                                        +---------+---------------+---------+-----------+----------+--------------+   +---------+---------------+---------+-----------+----------+--------------+ LEFT     CompressibilityPhasicitySpontaneityPropertiesThrombus Aging +---------+---------------+---------+-----------+----------+--------------+ CFV      Full           Yes      Yes                                 +---------+---------------+---------+-----------+----------+--------------+ SFJ      Full                                                        +---------+---------------+---------+-----------+----------+--------------+ FV Prox  Full                                                        +---------+---------------+---------+-----------+----------+--------------+ FV Mid   Full                                                        +---------+---------------+---------+-----------+----------+--------------+ FV DistalFull                                                        +---------+---------------+---------+-----------+----------+--------------+  PFV      Full                                                        +---------+---------------+---------+-----------+----------+--------------+ POP      Full           Yes      Yes                                  +---------+---------------+---------+-----------+----------+--------------+ PTV      Full                                                        +---------+---------------+---------+-----------+----------+--------------+ PERO     Full                                                        +---------+---------------+---------+-----------+----------+--------------+    Summary: RIGHT: - There is no evidence of deep vein thrombosis in the lower extremity.  - No cystic structure found in the popliteal fossa.  LEFT: - There is no evidence of deep vein thrombosis in the lower extremity.  - No cystic structure found in the popliteal fossa.  *See table(s) above for measurements and observations.    Preliminary    DG CHEST PORT 1 VIEW  Result Date: 08/14/2022 CLINICAL DATA:  Right chest tube, bilateral pleural effusions EXAM: PORTABLE CHEST 1 VIEW COMPARISON:  Previous studies including the examination of 08/13/2022 FINDINGS: Transverse diameter of heart is increased. Right chest tube is noted with its tip in the medial right lower lung field. Moderate bilateral pleural effusions are seen, more so on the left side. There is interval decrease in right pleural effusion. Sharp appearance of right cardiac margin may suggest loculated pneumothorax in the right lower lung field with interval decrease. There is no demonstrable apical pneumothorax. IMPRESSION: Cardiomegaly. Bilateral pleural effusions, more so on the left side. There is interval decrease in right pleural effusion and possible decrease in loculated pneumothorax in right lower lung field. There is no demonstrable apical pneumothorax. Electronically Signed   By: Elmer Picker M.D.   On: 08/14/2022 08:21   DG CHEST PORT 1 VIEW  Result Date: 08/13/2022 CLINICAL DATA:  Pleural effusion EXAM: PORTABLE CHEST 1 VIEW COMPARISON:  CXR 08/12/22 FINDINGS: Right-sided pigtail pleural drainage catheter in place, unchanged in appearance from prior exam.  Redemonstrated is a large right-sided hydropneumothorax which overall appears similar to prior exam with unchanged appearance of the air component along the apical and lateral margins of the right lung and the fluid component layering at the right lung base. The appearance of the left lung is also unchanged from prior exam with a persistent small layering pleural effusion. There is no new focal airspace opacity. Cardiac and mediastinal contours are unchanged from prior exam. No new displaced rib fracture. Visualized upper abdomen is unremarkable. IMPRESSION: 1. No significant change in appearance of the large right-sided hydropneumothorax  with a pigtail pleural drainage catheter in place. 2.  Unchanged layering moderate sized left pleural effusion. Electronically Signed   By: Marin Roberts M.D.   On: 08/13/2022 07:21   DG CHEST PORT 1 VIEW  Result Date: 08/12/2022 CLINICAL DATA:  [id number] with right hydropneumothorax, left pleural effusion. EXAM: PORTABLE CHEST 1 VIEW COMPARISON:  Portable chest yesterday at 7:28 a.m. FINDINGS: 4:29 a.m. Pigtail chest tube with tip in the medial right chest base is similar in positioning. Moderate to large bilateral pleural effusions, on the left layering posteriorly without pneumothorax, on the right again with loculated hydropneumothorax and extrapleural air again noted laterally. The right hydropneumothorax is estimated to occupy much as 50% of the right chest volume with the hydro component predominating. There is overlying consolidation or atelectasis in the lower lung fields. The overall aeration pattern seems unchanged. There is cardiomegaly with mild central vascular prominence. No overt edema is seen. Stable mediastinum. Again noted is a destructive lesion of the lateral right fifth rib likely metastasis. The patient is rotated to the right. IMPRESSION: 1. No significant change in the appearance of the chest since yesterday's study. 2. The right hydropneumothorax is  estimated to occupy much as 50% of the right chest volume with the hydro component predominating. 3. There is overlying consolidation or atelectasis in the lower lung fields with moderate-to-large layering left effusion. 4. Cardiomegaly with mild central vascular prominence. Electronically Signed   By: Telford Nab M.D.   On: 08/12/2022 06:03   DG Chest Port 1 View  Result Date: 08/11/2022 CLINICAL DATA:  Pleural effusion, metastatic breast carcinoma EXAM: PORTABLE CHEST 1 VIEW COMPARISON:  Previous studies including the examination of 08/10/2022 FINDINGS: Apparent shift of mediastinum to the right may be due to rotation. Transverse diameter of heart is increased. Moderate to large bilateral pleural effusions are seen. There is interval decrease in right pleural effusion. Single right chest tube is seen. Central pulmonary vessels are less prominent. Evaluation of right mid and both lower lung fields for infiltrates is limited by the effusions. There is no pneumothorax. IMPRESSION: Moderate to large bilateral pleural effusions. There is interval decrease in right pleural effusion. Electronically Signed   By: Elmer Picker M.D.   On: 08/11/2022 09:29   CT CHEST WO CONTRAST  Result Date: 08/10/2022 CLINICAL DATA:  Metastatic breast cancer.  Empyema. * Tracking Code: BO * EXAM: CT CHEST WITHOUT CONTRAST TECHNIQUE: Multidetector CT imaging of the chest was performed following the standard protocol without IV contrast. RADIATION DOSE REDUCTION: This exam was performed according to the departmental dose-optimization program which includes automated exposure control, adjustment of the mA and/or kV according to patient size and/or use of iterative reconstruction technique. COMPARISON:  06/25/2022 FINDINGS: Cardiovascular: Mild atherosclerotic calcification of the aortic arch. Mediastinum/Nodes: There is hazy diffuse edema/infiltration of the mediastinal and subcutaneous adipose tissues compatible with third  spacing of fluid. Lungs/Pleura: Large loculated right hydropneumothorax. Pigtail right pleural drainage catheter in place. IDA. Made only about 25% of the right lung is aerated. There is scattered atelectasis in the right lung. Moderate to large left pleural effusion encompassing about 1/3 of the left hemithoracic volume. No definite mass or nodule in the aerated portion of the left lung. Upper Abdomen: Stable small hypodense hepatic lesions, compatible with cysts. Stable benign cyst in the left kidney upper pole, not requiring further imaging workup. Musculoskeletal: The CT scan from 06/25/2022 showed enhancing lesions in the right chest wall and axilla. These are substantially less conspicuous today although  likely mainly from lack of IV contrast rather than resolution. A right axillary mass or lymph node measures 1.8 cm in short axis on image 52 series 2, formerly 1.9 cm. Bony destructive findings of the right fifth rib laterally as on image 62 series 5. Sclerotic focus possibly a healing fracture in the right eighth rib laterally. Extensive sclerosis in the sternum favoring metastatic disease over infection. Nearly complete central collapse of the T3 vertebral body with associated sclerosis and minimal posterior bony retropulsion unchanged from prior. IMPRESSION: 1. Large loculated right hydropneumothorax encompassing about 75% of right hemithoracic volume. Pigtail right pleural drainage catheter in place. 2. Moderate to large left pleural effusion encompassing about 1/3 of the left hemithoracic volume. 3. Right axillary mass or lymph node measures 1.8 cm in short axis, formerly 1.9 cm. Other previously seen chest wall soft tissue lesions are less conspicuous on today's exam due to lack of IV contrast, but likely still present. 4. Bony destructive findings of the right fifth rib laterally compatible with metastatic disease. 5. Extensive sclerosis in the sternum favoring metastatic disease over infection. 6.  Nearly complete central collapse of the T3 vertebral body with associated sclerosis and minimal posterior bony retropulsion, unchanged from prior. 7. Diffuse hazy edema/infiltration of the mediastinal and subcutaneous adipose tissues compatible with third spacing of fluid. 8. Aortic atherosclerosis. Aortic Atherosclerosis (ICD10-I70.0). Electronically Signed   By: Van Clines M.D.   On: 08/10/2022 10:39   DG Chest Port 1 View  Result Date: 08/10/2022 CLINICAL DATA:  Pleural effusion. Follow-up. Acute respiratory failure. EXAM: PORTABLE CHEST 1 VIEW COMPARISON:  08/08/2022 FINDINGS: There is a right-sided pigtail thoracostomy tube overlying the medial right lower lung. Right-sided, locule hydropneumothorax is identified. No significant change in volume compared with the previous exam. Moderate left pleural effusion appears increased in volume. IMPRESSION: 1. No significant change in volume of right-sided hydropneumothorax. 2. Increase in volume of moderate left pleural effusion. 3. Stable position of right thoracostomy tube. Electronically Signed   By: Kerby Moors M.D.   On: 08/10/2022 09:07   DG Chest Port 1 View  Result Date: 08/09/2022 CLINICAL DATA:  Acute respiratory failure, right pleural effusion EXAM: PORTABLE CHEST 1 VIEW COMPARISON:  08/08/2022 FINDINGS: Interval placement of right basilar chest tube. Decreasing right pleural effusion. Moderate right effusion remains. No pneumothorax. Extensive airspace disease throughout the right lung. Left perihilar and lower lobe airspace disease with layering left effusion. Cardiomegaly. No acute bony abnormality. IMPRESSION: Interval placement of right chest tube with decreasing right pleural effusion and slight improved aeration within the right lung. Continued diffuse airspace disease on the right and in the left lower lung. Bilateral effusions. No pneumothorax. Electronically Signed   By: Rolm Baptise M.D.   On: 08/09/2022 08:12   CT PERC  PLEURAL DRAIN W/INDWELL CATH W/IMG GUIDE  Result Date: 08/08/2022 INDICATION: 61 year old woman with right empyema presents to IR for chest tube placement EXAM: CT-guided right chest tube placement TECHNIQUE: Multidetector CT imaging of the chest was performed following the standard protocol without IV contrast. RADIATION DOSE REDUCTION: This exam was performed according to the departmental dose-optimization program which includes automated exposure control, adjustment of the mA and/or kV according to patient size and/or use of iterative reconstruction technique. MEDICATIONS: The patient is currently admitted to the hospital and receiving intravenous antibiotics. The antibiotics were administered within an appropriate time frame prior to the initiation of the procedure. ANESTHESIA/SEDATION: Moderate (conscious) sedation was employed during this procedure. A total of Versed 2 mg  and Fentanyl 100 mcg was administered intravenously by the radiology nurse. Total intra-service moderate Sedation Time: 14 minutes. The patient's level of consciousness and vital signs were monitored continuously by radiology nursing throughout the procedure under my direct supervision. COMPLICATIONS: None immediate. PROCEDURE: Informed written consent was obtained from the patient after a thorough discussion of the procedural risks, benefits and alternatives. All questions were addressed. Maximal Sterile Barrier Technique was utilized including caps, mask, sterile gowns, sterile gloves, sterile drape, hand hygiene and skin antiseptic. A timeout was performed prior to the initiation of the procedure. Patient positioned left lateral decubitus on the procedure table. The right lateral chest wall skin prepped and draped in usual fashion. Following local lidocaine administration, right pleural space was accessed with 19 gauge Yueh needle utilizing CT guidance. Yueh catheter removed over 0.035 inch Amplatz guidewire, serial dilation performed,  and 14 [place] multipurpose pigtail drain inserted. CT confirmed appropriate positioning of the drain within the right pleural space. The drain was connected to Pleur-Evac and secured to skin with suture. IMPRESSION: CT-guided right chest tube (14 [place]) as above. Electronically Signed   By: Miachel Roux M.D.   On: 08/08/2022 17:02   DG Chest Port 1 View  Result Date: 08/08/2022 CLINICAL DATA:  Follow-up pleural effusion. EXAM: PORTABLE CHEST 1 VIEW COMPARISON:  08/07/2022 FINDINGS: Exam detail is diminished due to patient positioning with significant rightward rotational artifact. The cardiomediastinal contours appear unchanged. There is a large right pleural effusion which is unchanged in volume compared with the previous exam. There is significantly diminished aeration to the right lung with only a small amount of aerated lung in the right upper lobe. Small left pleural effusion is unchanged. IMPRESSION: 1. No change in large right pleural effusion with significantly diminished aeration to the right lung. Electronically Signed   By: Kerby Moors M.D.   On: 08/08/2022 07:05   DG Chest Port 1 View  Result Date: 08/07/2022 CLINICAL DATA:  Pleural effusion EXAM: PORTABLE CHEST 1 VIEW COMPARISON:  08/06/2022 FINDINGS: No significant change in rotated AP portable examination. Large right pleural effusion with near total atelectasis or consolidation of the right lung, as well as a small, layering left pleural effusion. Cardiomegaly, the cardiac borders largely obscured. IMPRESSION: No significant change in rotated AP portable examination. Large right pleural effusion with near total atelectasis or consolidation of the right lung, as well as a small, layering left pleural effusion. Electronically Signed   By: Delanna Ahmadi M.D.   On: 08/07/2022 09:17   DG Chest Port 1 View  Result Date: 08/06/2022 CLINICAL DATA:  S/P thoracentesis EXAM: PORTABLE CHEST 1 VIEW COMPARISON:  Same day chest x-ray. FINDINGS:  Decreased but still large right pleural effusion. Small left pleural effusion. Bilateral overlying opacities. No visible pneumothorax on this semi erect radiograph. Cardiomediastinal silhouette is similar. Polyarticular degenerative change. IMPRESSION: 1. Decreased but still large right pleural effusion. No visible pneumothorax on this semi erect radiograph. 2. Small left pleural effusion 3. Bilateral overlying opacities. Electronically Signed   By: Margaretha Sheffield M.D.   On: 08/06/2022 15:50   DG Chest 1 View  Result Date: 08/06/2022 CLINICAL DATA:  Respiratory failure. EXAM: CHEST  1 VIEW COMPARISON:  Multiple recent chest x-rays. FINDINGS: Persistent complete opacification of the right hemithorax due to a large pleural effusion. Slight shift of the heart and mediastinum to the left exaggerated by the rotation of the patient and thoracic scoliosis. Persistent left lower lobe atelectasis but no left pleural effusion. IMPRESSION: Persistent complete  opacification of the right hemithorax. Electronically Signed   By: Marijo Sanes M.D.   On: 08/06/2022 11:48   DG Chest Port 1 View  Result Date: 08/05/2022 CLINICAL DATA:  Shortness of breath EXAM: PORTABLE CHEST 1 VIEW COMPARISON:  08/05/2022, 06/25/2022, 08/03/2022 FINDINGS: Complete opacification of right thorax without change. Subsegmental atelectasis left base. Partially obscured cardiomediastinal silhouette. IMPRESSION: Complete opacification of the right thorax without change, probably due to sizable pleural effusion. Subsegmental atelectasis left base Electronically Signed   By: Donavan Foil M.D.   On: 08/05/2022 23:33   ECHOCARDIOGRAM COMPLETE  Result Date: 08/05/2022    ECHOCARDIOGRAM REPORT   Patient Name:   CHERYLENE FERRUFINO Date of Exam: 08/05/2022 Medical Rec #:  [id number]       Height:       66.0 in Accession #:    [id number]      Weight:       118.6 lb Date of Birth:  06-20-1961        BSA:          1.602 m Patient Age:    21 years        BP:            97/44 mmHg Patient Gender: F               HR:           78 bpm. Exam Location:  Inpatient Procedure: 2D Echo, Cardiac Doppler and Color Doppler Indications:    R94.31 Abnormal EKG  History:        Patient has no prior history of Echocardiogram examinations.                 Risk Factors:Hypertension. Metastatic breast cancer.  Sonographer:    [place] Referring Phys: [id number] AMRIT ADHIKARI  Sonographer Comments: Technically difficult study due to poor echo windows, suboptimal apical window and suboptimal subcostal window. Apical images off axis due to the hardened texture of the left breast. Could not turn patient. Wound dressings in subcostal region. IMPRESSIONS  1. Left ventricular ejection fraction, by estimation, is 55 to 60%. The left ventricle has normal function. The left ventricle has no regional wall motion abnormalities. Left ventricular diastolic parameters were normal.  2. Right ventricular systolic function is normal. The right ventricular size is normal. Mildly increased right ventricular wall thickness. There is mildly elevated pulmonary artery systolic pressure. The estimated right ventricular systolic pressure is 15.1 mmHg.  3. Left atrial size was mildly dilated.  4. A small pericardial effusion is present. The pericardial effusion is circumferential. There is no evidence of cardiac tamponade. Large pleural effusion in the right lateral region.  5. 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 Tricuspid valve regurgitation is mild to moderate.  7. The aortic valve is tricuspid. Aortic valve regurgitation is not visualized. No aortic stenosis is present.  8. The inferior vena cava is normal in size with greater than 50% respiratory variability, suggesting right atrial pressure of 3 mmHg. FINDINGS  Left Ventricle: Left ventricular ejection fraction, by  estimation, is 55 to 60%. The left ventricle has normal function. The left ventricle has no regional wall motion abnormalities. The left ventricular internal cavity size was normal in size. There is  no left ventricular hypertrophy. Left ventricular diastolic parameters were normal. Right Ventricle: The right ventricular size is normal. Mildly  increased right ventricular wall thickness. Right ventricular systolic function is normal. There is mildly elevated pulmonary artery systolic pressure. The tricuspid regurgitant velocity is 3.19 m/s, and with an assumed right atrial pressure of 3 mmHg, the estimated right ventricular systolic pressure is 34.1 mmHg. Left Atrium: Left atrial size was mildly dilated. Right Atrium: Right atrial size was normal in size. Pericardium: Complex septated fluid collection in the right pleural cavity, consider empyema or malignant effusion. A small pericardial effusion is present. The pericardial effusion is circumferential. There is no evidence of cardiac tamponade. 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 There is mild thickening of the mitral valve leaflet(s). Mild mitral valve regurgitation, with centrally-directed jet. Tricuspid Valve: The tricuspid valve is myxomatous. Tricuspid valve regurgitation is mild to moderate. Aortic Valve: The aortic valve is tricuspid. Aortic valve regurgitation is not visualized. No aortic stenosis is present. Pulmonic Valve: The pulmonic valve was normal in structure. Pulmonic valve regurgitation is mild. Aorta: The aortic root and ascending aorta are structurally normal, with no evidence of dilitation. Venous: IVC assessment for right atrial pressure unable to be performed due to mechanical ventilation. The inferior vena cava is normal in size with greater than 50% respiratory variability, suggesting right atrial pressure of 3 mmHg. IAS/Shunts: No atrial level shunt  detected by color flow Doppler. Additional Comments: There is a large pleural effusion in the right lateral region.  LEFT VENTRICLE PLAX 2D LVIDd:         4.43 cm     Diastology LVIDs:         2.80 cm     LV e' medial:    5.08 cm/s LV PW:         1.03 cm     LV E/e' medial:  10.3 LV IVS:        0.87 cm     LV e' lateral:   6.46 cm/s LVOT diam:     2.10 cm     LV E/e' lateral: 8.1 LV SV:         44 LV SV Index:   28 LVOT Area:     3.46 cm  LV Volumes (MOD) LV vol d, MOD A4C: 57.5 ml LV vol s, MOD A4C: 18.7 ml LV SV MOD A4C:     57.5 ml RIGHT VENTRICLE             IVC RV S prime:     13.10 cm/s  IVC diam: 1.10 cm TAPSE (M-mode): 1.7 cm LEFT ATRIUM           Index        RIGHT ATRIUM           Index LA diam:      3.20 cm 2.00 cm/m   RA Area:     14.20 cm LA Vol (A4C): 57.5 ml 35.90 ml/m  RA Volume:   35.90 ml  22.42 ml/m  AORTIC VALVE             PULMONIC VALVE LVOT Vmax:   75.15 cm/s  PR End Diast Vel: 2.14 msec LVOT Vmean:  45.850 cm/s LVOT VTI:    0.127 m  AORTA Ao Root diam: 2.60 cm Ao Asc diam:  2.70 cm MITRAL VALVE               TRICUSPID VALVE MV Area (PHT): 4.89 cm    TR Peak grad:   40.7 mmHg  MV Decel Time: 155 msec    TR Vmax:        319.00 cm/s MV E velocity: 52.40 cm/s MV A velocity: 50.00 cm/s  SHUNTS MV E/A ratio:  1.05        Systemic VTI:  0.13 m                            Systemic Diam: 2.10 cm Dani Gobble Croitoru MD Electronically signed by Sanda Klein MD Signature Date/Time: 08/05/2022/12:38:14 PM    Final    DG CHEST PORT 1 VIEW  Result Date: 08/05/2022 CLINICAL DATA:  Right pleural effusion. EXAM: PORTABLE CHEST 1 VIEW COMPARISON:  08/05/2022 FINDINGS: Stable cardiomediastinal contours. Left lung is clear. Complete opacification of the right hemithorax is again noted and appears unchanged. Findings are favored to represent known large right pleural effusion. IMPRESSION: 1. No change in complete opacification of the right hemithorax which is presumed to represent large right pleural effusion.  Electronically Signed   By: Kerby Moors M.D.   On: 08/05/2022 10:06   DG CHEST PORT 1 VIEW  Result Date: 08/05/2022 CLINICAL DATA:  Respiratory compromise EXAM: PORTABLE CHEST 1 VIEW COMPARISON:  08/03/2022 FINDINGS: Complete opacification of the right hemithorax, presumably reflecting a large pleural effusion. Left lung is clear. No pneumothorax. The heart is normal in size. IMPRESSION: Complete opacification of the right hemithorax, presumably reflecting a large pleural effusion. Electronically Signed   By: Julian Hy M.D.   On: 08/05/2022 00:41   US THORACENTESIS ASP PLEURAL SPACE W/IMG GUIDE  Result Date: 08/03/2022 INDICATION: Patient with history of right-sided breast cancer, right pleural effusion. Request is made for diagnostic and therapeutic thoracentesis. EXAM: ULTRASOUND GUIDED RIGHT THORACENTESIS MEDICATIONS: 10 mL 1% lidocaine COMPLICATIONS: None immediate. PROCEDURE: An ultrasound guided thoracentesis was thoroughly discussed with the patient and questions answered. The benefits, risks, alternatives and complications were also discussed. The patient understands and wishes to proceed with the procedure. Written consent was obtained. Ultrasound was performed to localize and mark an adequate pocket of fluid in the right chest. The area was then prepped and draped in the normal sterile fashion. 1% Lidocaine was used for local anesthesia. Under ultrasound guidance a 6 Fr Safe-T-Centesis catheter was introduced. Thoracentesis was performed. The catheter was removed and a dressing applied. FINDINGS: A total of approximately 800 mL of yellow fluid was removed. Samples were sent to the laboratory as requested by the clinical team. IMPRESSION: Successful ultrasound guided right thoracentesis yielding 800 mL of pleural fluid. Read by: Brynda Greathouse PA-C Electronically Signed   By: Sandi Mariscal M.D.   On: 08/03/2022 13:06   DG Chest Port 1 View  Result Date: 08/03/2022 CLINICAL DATA:  Post  right thoracentesis EXAM: PORTABLE CHEST 1 VIEW COMPARISON:  08/02/2022 FINDINGS: Continued complete opacification of the right hemithorax. No visible pneumothorax. No confluent opacity or effusion on the left. Heart is normal size. IMPRESSION: Continued complete opacification of the right hemithorax. No visible pneumothorax. Electronically Signed   By: Rolm Baptise M.D.   On: 08/03/2022 11:52   DG Chest Portable 1 View  Result Date: 08/02/2022 CLINICAL DATA:  Weakness and breast cancer.  Dehydration.  Fatigue. EXAM: PORTABLE CHEST 1 VIEW COMPARISON:  Chest CT 06/25/2022 FINDINGS: Increased right pleural effusion with complete opacification of the right hemithorax. There is leftward shift of the trachea and mediastinal structures. No aerated right lung is seen. No focal airspace disease in the left lung. Left lung  nodules on prior CT are not seen by radiograph. No visible pneumothorax. Osseous metastatic disease on CT not well demonstrated. IMPRESSION: Increased right pleural effusion with complete opacification of the right hemithorax. No aerated right lung is seen. Leftward shift of the trachea and mediastinal structures. Electronically Signed   By: Keith Rake M.D.   On: 08/02/2022 16:53

## 2022-08-15 NOTE — Progress Notes (Signed)
PROGRESS NOTE    [name]  ZHY:[id number] DOB: Mar 05, 1961 DOA: 08/02/2022 PCP: Heath Lark, MD    Brief Narrative:  61 year old female with neglected fungating metastatic breast cancer, hypertension, recurrent right-sided malignant pleural effusion who was initially admitted with decreased appetite, weakness and lethargy.  Initially underwent thoracentesis on admission that was growing MSSA.  Emergent thoracentesis 11/8 due to septic shock.  11/4-admit to the hospital. 11/5-right-sided thoracentesis 800 mL removed, LDH 439, white cell 84%.  MSSA from the pleural fluid. 11/7-PCCM consulted and transferred to ICU, started on vasopressors with left groin central line.  Treated with BiPAP. 11/8-worsening pleural effusion, emergent thoracentesis done persistent staff. 11/10-pigtail catheter was placed and currently getting Lasix through the tube. 11/12 transfer to [facility] service.   Remains on pigtail catheter with tPA.  Remains on antibiotics. Remains in the hospital, chest tube management, IV antibiotics.  Frailty and debility.   Assessment & Plan:   Recurrent malignant pleural effusion on the right side with MSSA empyema: Septic shock secondary to MSSA empyema present on admission, resolving. Respiratory distress secondary to right-sided empyema, resolving.  Currently remains on cefazolin for MSSA,  Currently on Solu-Cortef.  Midodrine is off.  Blood pressure stable.   Fluid culture with MSSA, currently on cefazolin.  Continue. Repeat fluid cultures 11/14, no growth so far.  Final cultures pending. Currently has a pigtail catheter in place, chest x-ray with some improvement.   Large amount of drainage, continue to receive Lytics. Patient is likely not a candidate for VATS.  PCCM continues to follow-up.   Please schedule her management and long-term care discussions pending.  Transient A-fib/flutter on admission: Stable now.  Sinus rhythm.  Electrolytes: Magnesium and phosphorus  replaced.  Labs pending.  Declined.  Chronic cancer related pain: On home opiate doses and also as needed Dilaudid.  Patient remains on Remeron. Changed to Dilaudid 4 mg every 4 hours, some control over the pain but is still not adequate. Noted episode of unresponsiveness in the past, however her pain is not controlled. Pain is controlled now.  Metastatic breast cancer on antiestrogen therapy.  Followed by her oncologist. Chronic anemia secondary to critical illness Severe protein calorie malnutrition Cancer cachexia Severe frailty and debility  Continue supportive care.  Oncology discussing goal of care.   Currently remains DNR. Hospice appropriate.  Patient is not agreeable yet.  She will need SNF for rehab once chest tube plans are finalized.   DVT prophylaxis: enoxaparin (LOVENOX) injection 40 mg Start: 08/06/22 2200 Place and maintain sequential compression device Start: 08/06/22 1943   Code Status: DNR Family Communication: None at the bedside Disposition Plan: Status is: Inpatient Remains inpatient appropriate because: Chest tube management.     Consultants:  Critical care Palliative care Oncology  Procedures:  Multiple thoracentesis, pigtail catheter  Antimicrobials:  Cefazolin 11/8---   Subjective: Seen and examined.  Multiple questions answered.  Pain is controlled with oral pain medications and she has not been needing to use IV injections for last 24 hours.  She wants to walk with help.  Aware about going to skilled rehab after removing chest tube.  Can be transferred to progressive bed.   Objective: Vitals:   08/15/22 0500 08/15/22 0800 08/15/22 0900 08/15/22 1000  BP: 98/62 101/73 119/75 110/72  Pulse: 97 (!) 110 (!) 109 (!) 112  Resp: 17 (!) 22 (!) 30   Temp:   97.7 F (36.5 C)   TempSrc:   Oral   SpO2: 100% 100% 100% 100%  Weight:  59.2 kg     Height:        Intake/Output Summary (Last 24 hours) at 08/15/2022 1041 Last data filed at  08/15/2022 0700 Gross per 24 hour  Intake 527.21 ml  Output 1300 ml  Net -772.79 ml    Filed Weights   08/11/22 0419 08/12/22 0500 08/15/22 0500  Weight: 60.5 kg 59.5 kg 59.2 kg    Examination:  General exam: Frail debilitated and cachectic.   Respiratory system: On room air.  Not in obvious distress.  Poor air entry on the right side. Chest tube with serous sanguinous drainage.  Free-flowing. Patient has extensive skin ulcerations, fungating breast mass on the right side.  Infiltrating tumor on the skin. Cardiovascular system: S1 & S2 heard, RRR.  Gastrointestinal system: Soft.  Nondistended.  Bowel sound present.   Central nervous system: Alert and oriented. No focal neurological deficits.  Extremely debilitated. Extremities: Symmetric 5 x 5 power.  Lymphedema right hand. 2+ pedal edema both legs.    Data Reviewed: I have personally reviewed following labs and imaging studies  CBC: Recent Labs  Lab 08/09/22 1113 08/10/22 0522 08/11/22 1052 08/12/22 0241 08/13/22 2350  WBC 11.6* 12.1* 15.3* 14.8* 16.0*  HGB 8.5* 9.0* 9.9* 9.2* 9.0*  HCT 27.1* 28.0* 31.7* 28.1* 28.3*  MCV 83.1 81.9 83.2 80.1 83.0  PLT 498* 520* 483* 461* 517*    Basic Metabolic Panel: Recent Labs  Lab 08/09/22 1113 08/10/22 0522 08/11/22 1052 08/12/22 0241 08/12/22 2034 08/13/22 1228  NA 138 140 142 142  --  140  K 3.9 3.8 3.3* 4.2  --  3.9  CL 106 107 107 112*  --  108  CO2 '29 28 25 25  '$ --  27  GLUCOSE 153* 179* 204* 127*  --  179*  BUN '22 23 22 23  '$ --  19  CREATININE 0.73 0.72 0.64 0.56  --  0.65  CALCIUM 7.4* 7.4* 7.2* 7.1*  --  6.6*  MG  --   --   --  1.8 2.5*  --   PHOS  --   --   --  1.3* 1.9*  --     GFR: Estimated Creatinine Clearance: 68.1 mL/min (by C-G formula based on SCr of 0.65 mg/dL). Liver Function Tests: No results for input(s): "AST", "ALT", "ALKPHOS", "BILITOT", "PROT", "ALBUMIN" in the last 168 hours.  No results for input(s): "LIPASE", "AMYLASE" in the last 168  hours. No results for input(s): "AMMONIA" in the last 168 hours. Coagulation Profile: No results for input(s): "INR", "PROTIME" in the last 168 hours.  Cardiac Enzymes: No results for input(s): "CKTOTAL", "CKMB", "CKMBINDEX", "TROPONINI" in the last 168 hours. BNP (last 3 results) No results for input(s): "PROBNP" in the last 8760 hours. HbA1C: No results for input(s): "HGBA1C" in the last 72 hours. CBG: Recent Labs  Lab 08/14/22 0310 08/14/22 0733 08/14/22 1201 08/14/22 1700 08/14/22 1959  GLUCAP 124* 155* 128* 172* 146*    Lipid Profile: No results for input(s): "CHOL", "HDL", "LDLCALC", "TRIG", "CHOLHDL", "LDLDIRECT" in the last 72 hours. Thyroid Function Tests: No results for input(s): "TSH", "T4TOTAL", "FREET4", "T3FREE", "THYROIDAB" in the last 72 hours. Anemia Panel: No results for input(s): "VITAMINB12", "FOLATE", "FERRITIN", "TIBC", "IRON", "RETICCTPCT" in the last 72 hours. Sepsis Labs: No results for input(s): "PROCALCITON", "LATICACIDVEN" in the last 168 hours.   Recent Results (from the past 240 hour(s))  Body fluid culture w Gram Stain     Status: None   Collection Time: 08/06/22 12:31 PM  Specimen: Pleural Fluid  Result Value Ref Range Status   Specimen Description   Final    PLEURAL Performed at [facility] [address], [place], [place] [address]    Special Requests   Final    NONE Performed at [facility], [place] [address] [place], [place] [address]    Gram Stain   Final    FEW WBC PRESENT, PREDOMINANTLY PMN NO ORGANISMS SEEN Performed at [facility], [place] [address] [place], [place] [address]    Culture RARE STAPHYLOCOCCUS AUREUS  Final   Report Status 08/09/2022 FINAL  Final   Organism ID, Bacteria STAPHYLOCOCCUS AUREUS  Final      Susceptibility   Staphylococcus aureus - MIC*    CIPROFLOXACIN 1 SENSITIVE Sensitive     ERYTHROMYCIN >=8 RESISTANT Resistant     GENTAMICIN <=0.5 SENSITIVE  Sensitive     OXACILLIN 0.5 SENSITIVE Sensitive     TETRACYCLINE <=1 SENSITIVE Sensitive     VANCOMYCIN <=0.5 SENSITIVE Sensitive     TRIMETH/SULFA <=10 SENSITIVE Sensitive     CLINDAMYCIN <=0.25 SENSITIVE Sensitive     RIFAMPIN <=0.5 SENSITIVE Sensitive     Inducible Clindamycin NEGATIVE Sensitive     * RARE STAPHYLOCOCCUS AUREUS  Body fluid culture w Gram Stain     Status: None   Collection Time: 08/12/22  2:44 PM   Specimen: Pleura; Body Fluid  Result Value Ref Range Status   Specimen Description   Final    PLEURAL RIGHT Performed at [facility], [address], [place], [place] [address]    Special Requests   Final    Immunocompromised Performed at [facility], [place] [address], [place], [place] [address]    Gram Stain   Final    FEW WBC PRESENT, PREDOMINANTLY MONONUCLEAR NO ORGANISMS SEEN    Culture   Final    NO GROWTH 3 DAYS Performed at [facility] [address] [place], [place] [address]    Report Status 08/15/2022 FINAL  Final         Radiology Studies: DG CHEST PORT 1 VIEW  Result Date: 08/15/2022 CLINICAL DATA:  142230 Pleural effusion 142230 EXAM: PORTABLE CHEST 1 VIEW COMPARISON:  August 14, 2022 FINDINGS: The cardiomediastinal silhouette is unchanged in contour.RIGHT-sided chest tube. RIGHT-sided hydropneumothorax with a mildly decreased fluid component in comparison to prior thereby mildly increased conspicuity of the air component. Moderate LEFT pleural effusion. Bibasilar opacities, similar in comparison to prior. Revisualization of a peripheral RIGHT rind of airspace opacities. IMPRESSION: 1. RIGHT-sided hydropneumothorax with mild decrease in the fluid component and increased conspicuity of the air component. There is likely a corresponding corticated RIGHT lung. 2. Moderate LEFT pleural effusion, similar in comparison to prior. Electronically Signed   By: Valentino Saxon M.D.   On: 08/15/2022 09:52   VAS [place] LOWER  EXTREMITY VENOUS (DVT)  Result Date: 08/14/2022  Lower Venous DVT Study Patient Name:  EPIFANIA LITTRELL  Date of Exam:   08/14/2022 Medical Rec #: [id number]        Accession #:    [id number] Date of Birth: 1961/08/26         Patient Gender: F Patient Age:   89 years Exam Location:  [facility] Procedure:      VAS [place] LOWER EXTREMITY VENOUS (DVT) Referring Phys: Noemi Chapel --------------------------------------------------------------------------------  Indications: Edema.  Risk Factors: None identified. Limitations: Poor ultrasound/tissue interface. Comparison Study: No prior studies. Performing Technologist:  Oliver Hum RVT  Examination Guidelines: A complete evaluation includes B-mode imaging, spectral Doppler, color Doppler, and power Doppler as needed of all accessible portions of each vessel. Bilateral testing is considered an integral part of a complete examination. Limited examinations for reoccurring indications may be performed as noted. The reflux portion of the exam is performed with the patient in reverse Trendelenburg.  +---------+---------------+---------+-----------+----------+--------------+ RIGHT    CompressibilityPhasicitySpontaneityPropertiesThrombus Aging +---------+---------------+---------+-----------+----------+--------------+ CFV      Full           Yes      Yes                                 +---------+---------------+---------+-----------+----------+--------------+ SFJ      Full                                                        +---------+---------------+---------+-----------+----------+--------------+ FV Prox  Full                                                        +---------+---------------+---------+-----------+----------+--------------+ FV Mid   Full                                                        +---------+---------------+---------+-----------+----------+--------------+ FV DistalFull                                                         +---------+---------------+---------+-----------+----------+--------------+ PFV      Full                                                        +---------+---------------+---------+-----------+----------+--------------+ POP      Full           Yes      Yes                                 +---------+---------------+---------+-----------+----------+--------------+ PTV      Full                                                        +---------+---------------+---------+-----------+----------+--------------+ PERO     Full                                                        +---------+---------------+---------+-----------+----------+--------------+   +---------+---------------+---------+-----------+----------+--------------+  LEFT     CompressibilityPhasicitySpontaneityPropertiesThrombus Aging +---------+---------------+---------+-----------+----------+--------------+ CFV      Full           Yes      Yes                                 +---------+---------------+---------+-----------+----------+--------------+ SFJ      Full                                                        +---------+---------------+---------+-----------+----------+--------------+ FV Prox  Full                                                        +---------+---------------+---------+-----------+----------+--------------+ FV Mid   Full                                                        +---------+---------------+---------+-----------+----------+--------------+ FV DistalFull                                                        +---------+---------------+---------+-----------+----------+--------------+ PFV      Full                                                        +---------+---------------+---------+-----------+----------+--------------+ POP      Full           Yes      Yes                                  +---------+---------------+---------+-----------+----------+--------------+ PTV      Full                                                        +---------+---------------+---------+-----------+----------+--------------+ PERO     Full                                                        +---------+---------------+---------+-----------+----------+--------------+    Summary: RIGHT: - There is no evidence of deep vein thrombosis in the lower extremity.  - No cystic structure found in the popliteal fossa.  LEFT: - There is no evidence of deep vein thrombosis in the lower extremity.  - No cystic  structure found in the popliteal fossa.  *See table(s) above for measurements and observations.    Preliminary    DG CHEST PORT 1 VIEW  Result Date: 08/14/2022 CLINICAL DATA:  Right chest tube, bilateral pleural effusions EXAM: PORTABLE CHEST 1 VIEW COMPARISON:  Previous studies including the examination of 08/13/2022 FINDINGS: Transverse diameter of heart is increased. Right chest tube is noted with its tip in the medial right lower lung field. Moderate bilateral pleural effusions are seen, more so on the left side. There is interval decrease in right pleural effusion. Sharp appearance of right cardiac margin may suggest loculated pneumothorax in the right lower lung field with interval decrease. There is no demonstrable apical pneumothorax. IMPRESSION: Cardiomegaly. Bilateral pleural effusions, more so on the left side. There is interval decrease in right pleural effusion and possible decrease in loculated pneumothorax in right lower lung field. There is no demonstrable apical pneumothorax. Electronically Signed   By: Elmer Picker M.D.   On: 08/14/2022 08:21        Scheduled Meds:  anastrozole  1 mg Oral Daily   bisacodyl  10 mg Rectal Once   calcium carbonate  1 tablet Oral BID   Chlorhexidine Gluconate Cloth  6 each Topical Daily   enoxaparin (LOVENOX) injection  40 mg Subcutaneous Q24H    famotidine  20 mg Oral Daily   feeding supplement  237 mL Oral TID BM   hydrocortisone sod succinate (SOLU-CORTEF) inj  50 mg Intravenous Daily   megestrol  40 mg Oral BID   metroNIDAZOLE   Topical Q1400   mirtazapine  7.5 mg Oral QHS   polyethylene glycol  17 g Oral Daily   senna  2 tablet Oral BID   sodium chloride flush  10 mL Intrapleural Q8H   sodium chloride flush  3 mL Intravenous Q12H   Continuous Infusions:  sodium chloride     sodium chloride Stopped (08/14/22 2300)    ceFAZolin (ANCEF) IV Stopped (08/15/22 0555)     LOS: 11 days    Time spent: 35 minutes    Barb Merino, MD [facility] Pager [phone]

## 2022-08-15 NOTE — Progress Notes (Signed)
NAME:  [name], MRN:  [id number], DOB:  [birth year], LOS: 11 ADMISSION DATE:  08/02/2022, CONSULTATION DATE:  08/05/22 REFERRING MD:  Gershon Cull , CHIEF COMPLAINT:   Lethargy, ams   BRIEF  61 year old with metastatic breast cancer, hypertension with recurrent right-sided malignant effusion presenting with decreased appetite, weakness and lethargy.  She underwent thoracentesis on admission showing MSSA empyema.  She underwent emergent thoracentesis 11/8.  Required vasopressors, weaned off.   Pertinent  Medical History  HTN Breast cancer - R chest wall, contralateral breast (L. Mets to lung and bones.  Responding well to chemo/antiestrogen.   Failutre to thrive, weight loss, malignant cachexia  Malignant pleural effusion R   Home meds: abemaciclib, arimidex, calcium carbonate, vit D, Dilaudid '8mg'$  q6 prn  Mag oxide, mirtazapine 7.'5mg'$  qhs  Miralax, senna  Flagyl gel   Significant Hospital Events: Including procedures, antibiotic start and stop dates in addition to other pertinent events   03/23/2022: Right-sided thoracentesis: LDH 259.  White cells 207.  8% polymorphs. Malignant cells consistent with breast cancer present 08/02/2022 - admit MRSA PCR neg 08/03/2022: Right thoracentesis 800 mL and stopped due to pain LDH 439, white cell 7100/84% polymorphs > huge increase in white count and left shift. MSSA gorwing from pleural fluids 08/03/22 08/05/22 -PCCM consult and ICU tx Left groin CVL Left groin a-line  08/05/22 -last Narcan was at 4 AM.  Now completely awake on BiPAP.  Wants BiPAP off.  No respiratory distress.  Remains on vasopressors Levophed 9 mcg and vasopressin.  Diastolic is low at 40 with map of 71.  She has left femoral arterial line and central line.  She is oriented and communicating.  She remains afebrile.  White count is high.  She is on antibiotics vancomycin and cefepime. ECHO - ef65%. RSVP 45+., Mild to mod TR. Mild MR Family not in favor for pleurx. Dr Alvy Bimler  recommending 1 more thora 08/06/22 worsening effusion with ?tension physiology s/p emergent thora. Repeat pleural Cx showing staph 11/10 after extensive patient with family informed decision made for right pigtail catheter by IR 11/12 CT drainage 1070. S/p tPA, dornase 11/13 CT drainage 1L, s/p tPA, dornase 11/15 tpa, dornase for decreased chest tube output  Interim History / Subjective:  700cc chest tube output charted. Afebrile  Feel some chest pressure/tightness on right side   Objective   Blood pressure 98/62, pulse 97, temperature 98.3 F (36.8 C), temperature source Oral, resp. rate 17, height '5\' 6"'$  (1.676 m), weight 59.2 kg, SpO2 100 %.        Intake/Output Summary (Last 24 hours) at 08/15/2022 0851 Last data filed at 08/15/2022 0700 Gross per 24 hour  Intake 527.21 ml  Output 1300 ml  Net -772.79 ml   Filed Weights   08/11/22 0419 08/12/22 0500 08/15/22 0500  Weight: 60.5 kg 59.5 kg 59.2 kg    Exam: General appearance: 61 y.o., female, NAD, thin chronically ill appearing Eyes: PERRL, tracking appropriately HENT: NCAT; MMM Lungs: diminished on right and left base, with normal respiratory effort CV: RRR, no murmur  Abdomen: Soft, non-tender; non-distended, BS present  Extremities: No peripheral edema, warm Neuro: grossly nonfocal  Bedside US with simple appearing moderate left pleural effusion   No new labs  CXR with right hydroPTX, less fluid component, moderate left effusion  Resolved Hospital Problem list    Malignant right pleural effusion  MSSA empyema Acute respiratory failure with hypoxia due to bilateral pleural effusion Trapped lung on the R w/ hydropneumothorax - completed  3 doses of tPA and dornase with good response.  Continue to monitor chest tube output though I think at some point as long as she's afebrile/clinically stable we may just have to remove it despite high output. Her overall fluid burden is probably just increased by her MPE and low  oncotic pressure rather than empyema. - tentative 6 week course of ABX - ancef while inpatient, consider transition to augmentin as nears discharge - will discuss timing of PleurX relative to ABX course with partners. Even though clinically stable I've never placed pleurx for pt with pleural space infection actively undergoing treatment. PleurX would also potentially limit her placement as SNF not typically equipped to handle it. - If becomes more dyspneic would trial diuresis for left pleural effusion before resorting to thoracentesis - appears simple.    Metastatic breast cancer -mgmt per Dr. Alvy Bimler  -Need to continue to focus aggressively on nutrition and rehabilitation  Goals of Care -DNR after discussion with Dr. Alvy Bimler  -ongoing discussions -prognosis remains guarded with co-morbidities, poor nutrition, advanced cancer   Best Practice (right click and "Reselect all SmartList Selections" daily)   Per [facility]   Signature:   Maryjane Hurter 08/15/22 8:51 AM [facility]

## 2022-08-16 DIAGNOSIS — J90 Pleural effusion, not elsewhere classified: Secondary | ICD-10-CM | POA: Diagnosis not present

## 2022-08-16 LAB — CBC
HCT: 29.7 % — ABNORMAL LOW (ref 36.0–46.0)
Hemoglobin: 9.2 g/dL — ABNORMAL LOW (ref 12.0–15.0)
MCH: 25.9 pg — ABNORMAL LOW (ref 26.0–34.0)
MCHC: 31 g/dL (ref 30.0–36.0)
MCV: 83.7 fL (ref 80.0–100.0)
Platelets: 518 10*3/uL — ABNORMAL HIGH (ref 150–400)
RBC: 3.55 MIL/uL — ABNORMAL LOW (ref 3.87–5.11)
RDW: 17.2 % — ABNORMAL HIGH (ref 11.5–15.5)
WBC: 13.5 10*3/uL — ABNORMAL HIGH (ref 4.0–10.5)
nRBC: 0 % (ref 0.0–0.2)

## 2022-08-16 NOTE — Progress Notes (Signed)
PROGRESS NOTE    [name]  JWJ:[id number] DOB: [birth year] DOA: 08/02/2022 PCP: Heath Lark, MD    Brief Narrative:  61 year old female with neglected fungating metastatic breast cancer, hypertension, recurrent right-sided malignant pleural effusion who was initially admitted with decreased appetite, weakness and lethargy.  Initially underwent thoracentesis on admission that was growing MSSA.  Emergent thoracentesis 11/8 due to septic shock.  11/4-admit to the hospital. 11/5-right-sided thoracentesis 800 mL removed, LDH 439, white cell 84%.  MSSA from the pleural fluid. 11/7-PCCM consulted and transferred to ICU, started on vasopressors with left groin central line.  Treated with BiPAP. 11/8-worsening pleural effusion, emergent thoracentesis done persistent staff. 11/10-pigtail catheter was placed and currently getting Lasix through the tube. 11/12 transfer to [facility] service.   Remains on pigtail catheter with tPA.  Remains on antibiotics. Remains in the hospital, chest tube management, IV antibiotics.  Frailty and debility. Chest tube is actively draining.   Assessment & Plan:   Recurrent malignant pleural effusion on the right side with MSSA empyema: Septic shock secondary to MSSA empyema present on admission, resolving. Respiratory distress secondary to right-sided empyema, resolving.  Currently remains on cefazolin for MSSA,  Currently on Solu-Cortef.  Midodrine is off.  Blood pressure stable.   Fluid culture with MSSA, currently on cefazolin.  Continue. Repeat fluid cultures 11/14, no growth so far.  Final cultures pending. Currently has a pigtail catheter in place, chest x-ray with some improvement.  Trapped lung.  Left pleural effusion. Large amount of drainage, followed by PCCM. Patient is likely not a candidate for VATS.  PCCM continues to follow-up.    Transient A-fib/flutter on admission: Stable now.  Sinus rhythm.  Electrolytes: Magnesium and phosphorus replaced.   Patient is now declining labs.  Chronic cancer related pain: On home opiate doses and also as needed Dilaudid.  Patient remains on Remeron. Changed to Dilaudid 4 mg every 4 hours, some control over the pain but is still not adequate. Noted episode of unresponsiveness in the past, however her pain is not controlled. Pain is controlled now.  Metastatic breast cancer on antiestrogen therapy.  Followed by her oncologist. Chronic anemia secondary to critical illness Severe protein calorie malnutrition Cancer cachexia Severe frailty and debility  Continue supportive care.  Oncology discussing goal of care.   Currently remains DNR. Hospice appropriate.  Patient is not agreeable yet.  She will need SNF for rehab once chest tube plans are finalized. Not sure how we will be able to help her get out of the hospital.   DVT prophylaxis: enoxaparin (LOVENOX) injection 40 mg Start: 08/06/22 2200 Place and maintain sequential compression device Start: 08/06/22 1943   Code Status: DNR Family Communication: None at the bedside Disposition Plan: Status is: Inpatient Remains inpatient appropriate because: Chest tube management.     Consultants:  Critical care Palliative care Oncology  Procedures:  Multiple thoracentesis, pigtail catheter  Antimicrobials:  Cefazolin 11/8---   Subjective:  Patient seen and examined.  Very upset and frustrated because of moving out of ICU.  She thought that staff were  not kind enough to give her enough time, family time to move. No other overnight events. Conversations were focused on frustrations but she denied any pain today.  She is trying to maintain on oral pain medications. Chest tube is draining 750 mL last 24 hours.  I tried to bring up goal of care discussion with her, she distracts the conversation.  Patient does not want to focus on any goal of care discussions.  Declining labs especially finger pricks.   Objective: Vitals:   08/15/22 1317  08/15/22 1723 08/15/22 2016 08/16/22 0629  BP: (!) 132/92 (!) 124/94 113/74 110/70  Pulse: (!) 117 (!) 121 (!) 110 (!) 104  Resp: '18 20 18 18  '$ Temp: 98.3 F (36.8 C) 98.4 F (36.9 C) 98.3 F (36.8 C) 98.4 F (36.9 C)  TempSrc: Oral Oral Oral Oral  SpO2: 95% 100% 99% 100%  Weight:      Height:        Intake/Output Summary (Last 24 hours) at 08/16/2022 1124 Last data filed at 08/16/2022 0800 Gross per 24 hour  Intake 720 ml  Output 520 ml  Net 200 ml    Filed Weights   08/11/22 0419 08/12/22 0500 08/15/22 0500  Weight: 60.5 kg 59.5 kg 59.2 kg    Examination:  General exam: Frail.  Debilitated.  Cachectic.  Anxious and frustrated today. Respiratory system: On room air.  Not in any distress.  On room air. Chest tube with serous sanguinous drainage.  Free-flowing. Patient has extensive skin ulcerations, fungating breast mass on the right side.  Infiltrating tumor on the skin. Cardiovascular system: S1 & S2 heard, RRR.  Gastrointestinal system: Soft.  Nondistended.  Bowel sound present.   Central nervous system: Alert and oriented. No focal neurological deficits.  Extremely debilitated. Extremities: Symmetric 5 x 5 power.  Lymphedema right hand. 2+ pedal edema both legs.    Data Reviewed: I have personally reviewed following labs and imaging studies  CBC: Recent Labs  Lab 08/10/22 0522 08/11/22 1052 08/12/22 0241 08/13/22 2350 08/16/22 0033  WBC 12.1* 15.3* 14.8* 16.0* 13.5*  HGB 9.0* 9.9* 9.2* 9.0* 9.2*  HCT 28.0* 31.7* 28.1* 28.3* 29.7*  MCV 81.9 83.2 80.1 83.0 83.7  PLT 520* 483* 461* 517* 518*    Basic Metabolic Panel: Recent Labs  Lab 08/10/22 0522 08/11/22 1052 08/12/22 0241 08/12/22 2034 08/13/22 1228 08/15/22 1439  NA 140 142 142  --  140 141  K 3.8 3.3* 4.2  --  3.9 4.0  CL 107 107 112*  --  108 109  CO2 '28 25 25  '$ --  27 29  GLUCOSE 179* 204* 127*  --  179* 184*  BUN '23 22 23  '$ --  19 24*  CREATININE 0.72 0.64 0.56  --  0.65 0.67  CALCIUM  7.4* 7.2* 7.1*  --  6.6* 7.0*  MG  --   --  1.8 2.5*  --   --   PHOS  --   --  1.3* 1.9*  --   --     GFR: Estimated Creatinine Clearance: 68.1 mL/min (by C-G formula based on SCr of 0.67 mg/dL). Liver Function Tests: No results for input(s): "AST", "ALT", "ALKPHOS", "BILITOT", "PROT", "ALBUMIN" in the last 168 hours.  No results for input(s): "LIPASE", "AMYLASE" in the last 168 hours. No results for input(s): "AMMONIA" in the last 168 hours. Coagulation Profile: No results for input(s): "INR", "PROTIME" in the last 168 hours.  Cardiac Enzymes: No results for input(s): "CKTOTAL", "CKMB", "CKMBINDEX", "TROPONINI" in the last 168 hours. BNP (last 3 results) No results for input(s): "PROBNP" in the last 8760 hours. HbA1C: No results for input(s): "HGBA1C" in the last 72 hours. CBG: Recent Labs  Lab 08/14/22 0310 08/14/22 0733 08/14/22 1201 08/14/22 1700 08/14/22 1959  GLUCAP 124* 155* 128* 172* 146*    Lipid Profile: No results for input(s): "CHOL", "HDL", "LDLCALC", "TRIG", "CHOLHDL", "LDLDIRECT" in the last 72 hours. Thyroid Function  Tests: No results for input(s): "TSH", "T4TOTAL", "FREET4", "T3FREE", "THYROIDAB" in the last 72 hours. Anemia Panel: No results for input(s): "VITAMINB12", "FOLATE", "FERRITIN", "TIBC", "IRON", "RETICCTPCT" in the last 72 hours. Sepsis Labs: No results for input(s): "PROCALCITON", "LATICACIDVEN" in the last 168 hours.   Recent Results (from the past 240 hour(s))  Body fluid culture w Gram Stain     Status: None   Collection Time: 08/06/22 12:31 PM   Specimen: Pleural Fluid  Result Value Ref Range Status   Specimen Description   Final    PLEURAL Performed at [facility] [address] [place], [place] [address]    Special Requests   Final    NONE Performed at [facility], [place] [address] [place], [place] [address]    Gram Stain   Final    FEW WBC PRESENT, PREDOMINANTLY PMN NO ORGANISMS  SEEN Performed at [facility], [place] [address], [place], [place] [address]    Culture RARE STAPHYLOCOCCUS AUREUS  Final   Report Status 08/09/2022 FINAL  Final   Organism ID, Bacteria STAPHYLOCOCCUS AUREUS  Final      Susceptibility   Staphylococcus aureus - MIC*    CIPROFLOXACIN 1 SENSITIVE Sensitive     ERYTHROMYCIN >=8 RESISTANT Resistant     GENTAMICIN <=0.5 SENSITIVE Sensitive     OXACILLIN 0.5 SENSITIVE Sensitive     TETRACYCLINE <=1 SENSITIVE Sensitive     VANCOMYCIN <=0.5 SENSITIVE Sensitive     TRIMETH/SULFA <=10 SENSITIVE Sensitive     CLINDAMYCIN <=0.25 SENSITIVE Sensitive     RIFAMPIN <=0.5 SENSITIVE Sensitive     Inducible Clindamycin NEGATIVE Sensitive     * RARE STAPHYLOCOCCUS AUREUS  Body fluid culture w Gram Stain     Status: None   Collection Time: 08/12/22  2:44 PM   Specimen: Pleura; Body Fluid  Result Value Ref Range Status   Specimen Description   Final    PLEURAL RIGHT Performed at [facility], [address] [place], [place] [address]    Special Requests   Final    Immunocompromised Performed at [facility], [place] [address], [place], [place] [address]    Gram Stain   Final    FEW WBC PRESENT, PREDOMINANTLY MONONUCLEAR NO ORGANISMS SEEN    Culture   Final    NO GROWTH 3 DAYS Performed at [facility] [address], [place], [place] [address]    Report Status 08/15/2022 FINAL  Final         Radiology Studies: VAS [place] LOWER EXTREMITY VENOUS (DVT)  Result Date: 08/15/2022  Lower Venous DVT Study Patient Name:  [name]  Date of Exam:   08/14/2022 Medical Rec #: [id number]        Accession #:    [id number] Date of Birth: 04-Mar-1961         Patient Gender: F Patient Age:   25 years Exam Location:  [facility] Procedure:      VAS [place] LOWER EXTREMITY VENOUS (DVT) Referring Phys: Noemi Chapel --------------------------------------------------------------------------------  Indications: Edema.  Risk  Factors: None identified. Limitations: Poor ultrasound/tissue interface. Comparison Study: No prior studies. Performing Technologist: Oliver Hum RVT  Examination Guidelines: A complete evaluation includes B-mode imaging, spectral Doppler, color Doppler, and power Doppler as needed of all accessible portions of each vessel. Bilateral testing is considered an integral part of a complete examination. Limited examinations for reoccurring indications may be performed as noted. The reflux portion  of the exam is performed with the patient in reverse Trendelenburg.  +---------+---------------+---------+-----------+----------+--------------+ RIGHT    CompressibilityPhasicitySpontaneityPropertiesThrombus Aging +---------+---------------+---------+-----------+----------+--------------+ CFV      Full           Yes      Yes                                 +---------+---------------+---------+-----------+----------+--------------+ SFJ      Full                                                        +---------+---------------+---------+-----------+----------+--------------+ FV Prox  Full                                                        +---------+---------------+---------+-----------+----------+--------------+ FV Mid   Full                                                        +---------+---------------+---------+-----------+----------+--------------+ FV DistalFull                                                        +---------+---------------+---------+-----------+----------+--------------+ PFV      Full                                                        +---------+---------------+---------+-----------+----------+--------------+ POP      Full           Yes      Yes                                 +---------+---------------+---------+-----------+----------+--------------+ PTV      Full                                                         +---------+---------------+---------+-----------+----------+--------------+ PERO     Full                                                        +---------+---------------+---------+-----------+----------+--------------+   +---------+---------------+---------+-----------+----------+--------------+ LEFT     CompressibilityPhasicitySpontaneityPropertiesThrombus Aging +---------+---------------+---------+-----------+----------+--------------+ CFV      Full           Yes      Yes                                 +---------+---------------+---------+-----------+----------+--------------+  SFJ      Full                                                        +---------+---------------+---------+-----------+----------+--------------+ FV Prox  Full                                                        +---------+---------------+---------+-----------+----------+--------------+ FV Mid   Full                                                        +---------+---------------+---------+-----------+----------+--------------+ FV DistalFull                                                        +---------+---------------+---------+-----------+----------+--------------+ PFV      Full                                                        +---------+---------------+---------+-----------+----------+--------------+ POP      Full           Yes      Yes                                 +---------+---------------+---------+-----------+----------+--------------+ PTV      Full                                                        +---------+---------------+---------+-----------+----------+--------------+ PERO     Full                                                        +---------+---------------+---------+-----------+----------+--------------+     Summary: RIGHT: - There is no evidence of deep vein thrombosis in the lower extremity.  - No cystic structure found in  the popliteal fossa.  LEFT: - There is no evidence of deep vein thrombosis in the lower extremity.  - No cystic structure found in the popliteal fossa.  *See table(s) above for measurements and observations. Electronically signed by Deitra Mayo MD on 08/15/2022 at 3:02:04 PM.    Final    DG CHEST PORT 1 VIEW  Result Date: 08/15/2022 CLINICAL DATA:  142230 Pleural effusion 142230 EXAM: PORTABLE CHEST 1 VIEW COMPARISON:  August 14, 2022 FINDINGS: The cardiomediastinal silhouette is unchanged  in contour.RIGHT-sided chest tube. RIGHT-sided hydropneumothorax with a mildly decreased fluid component in comparison to prior thereby mildly increased conspicuity of the air component. Moderate LEFT pleural effusion. Bibasilar opacities, similar in comparison to prior. Revisualization of a peripheral RIGHT rind of airspace opacities. IMPRESSION: 1. RIGHT-sided hydropneumothorax with mild decrease in the fluid component and increased conspicuity of the air component. There is likely a corresponding corticated RIGHT lung. 2. Moderate LEFT pleural effusion, similar in comparison to prior. Electronically Signed   By: Valentino Saxon M.D.   On: 08/15/2022 09:52        Scheduled Meds:  anastrozole  1 mg Oral Daily   bisacodyl  10 mg Rectal Once   calcium carbonate  1 tablet Oral BID   enoxaparin (LOVENOX) injection  40 mg Subcutaneous Q24H   famotidine  20 mg Oral Daily   feeding supplement  237 mL Oral TID BM   hydrocortisone sod succinate (SOLU-CORTEF) inj  50 mg Intravenous Daily   megestrol  40 mg Oral BID   metroNIDAZOLE   Topical Q1400   mirtazapine  7.5 mg Oral QHS   multivitamin with minerals  1 tablet Oral Daily   polyethylene glycol  17 g Oral Daily   senna  2 tablet Oral BID   sodium chloride flush  10 mL Intrapleural Q8H   sodium chloride flush  3 mL Intravenous Q12H   Continuous Infusions:  sodium chloride     sodium chloride Stopped (08/14/22 2300)    ceFAZolin (ANCEF) IV 2 g  (08/16/22 0415)     LOS: 12 days    Time spent: 35 minutes    Barb Merino, MD [facility] Pager [phone]

## 2022-08-17 ENCOUNTER — Inpatient Hospital Stay (HOSPITAL_COMMUNITY): Payer: BC Managed Care – PPO

## 2022-08-17 DIAGNOSIS — J90 Pleural effusion, not elsewhere classified: Secondary | ICD-10-CM | POA: Diagnosis not present

## 2022-08-17 LAB — BASIC METABOLIC PANEL
Anion gap: 9 (ref 5–15)
BUN: 22 mg/dL (ref 8–23)
CO2: 24 mmol/L (ref 22–32)
Calcium: 7.4 mg/dL — ABNORMAL LOW (ref 8.9–10.3)
Chloride: 109 mmol/L (ref 98–111)
Creatinine, Ser: 0.61 mg/dL (ref 0.44–1.00)
GFR, Estimated: >60 mL/min (ref >60)
Glucose, Bld: 107 mg/dL — ABNORMAL HIGH (ref 70–99)
Potassium: 3.9 mmol/L (ref 3.5–5.1)
Sodium: 142 mmol/L (ref 135–145)

## 2022-08-17 LAB — GLUCOSE, CAPILLARY: Glucose-Capillary: 118 mg/dL — ABNORMAL HIGH (ref 70–99)

## 2022-08-17 NOTE — Progress Notes (Signed)
Physical Therapy Treatment Patient Details Name: [name] MRN: [id number] DOB: 1961-06-06 Today's Date: 08/17/2022   History of Present Illness s Trussell is a 61 yo woman with a hx of metastatic breast cancer, HTN, admitted 11/4 with decreased appetite, poor po intake , weakness, lethargy,persistent pleural effusion despite thoracentesis, FTT, transfer to iCU 11/6 for decreased response and  low BP,  right CT placed .    PT Comments    Pt remains extremely motivated to work with PT. Pleased to be out of room, ambulating. She continues to fatigue fairly quickly although activity tol is improving.  Reviewed exercises for pt to do on her own as well as postural exercises and breathing techniques. Continue PT in acute setting   Recommendations for follow up therapy are one component of a multi-disciplinary discharge planning process, led by the attending physician.  Recommendations may be updated based on patient status, additional functional criteria and insurance authorization.  Follow Up Recommendations  Skilled nursing-short term rehab (<3 hours/day) Can patient physically be transported by private vehicle: No   Assistance Recommended at Discharge Frequent or constant Supervision/Assistance  Patient can return home with the following A lot of help with walking and/or transfers;A lot of help with bathing/dressing/bathroom;Assistance with cooking/housework;Assist for transportation   Equipment Recommendations  None recommended by PT    Recommendations for Other Services       Precautions / Restrictions Precautions Precautions: Fall Precaution Comments: R CT to wall suction Restrictions Weight Bearing Restrictions: No     Mobility  Bed Mobility               General bed mobility comments: in recliner    Transfers Overall transfer level: Needs assistance Equipment used: Rolling walker (2 wheels) Transfers: Sit to/from Stand Sit to Stand: Min assist, Mod assist            General transfer comment: multi-modal cues to scoot to edge, push from chair with LUE, RUE on RW and to power up with LEs; incr assist required on secodn trial d/t fatigue    Ambulation/Gait Ambulation/Gait assistance: Min guard, +2 safety/equipment Gait Distance (Feet): 70 Feet (x2) Assistive device: Rolling walker (2 wheels) Gait Pattern/deviations: Step-through pattern, Narrow base of support, Trunk flexed       General Gait Details: multi-modal cues for trunk extension, proper  breathing pattern. sitting rest after 2' with 3/4 DOE. SpO2=100% after amb. HR 110-124 max with activity   Stairs             Wheelchair Mobility    Modified Rankin (Stroke Patients Only)       Balance   Sitting-balance support: Bilateral upper extremity supported, Feet supported Sitting balance-Leahy Scale: Good     Standing balance support: Bilateral upper extremity supported, During functional activity Standing balance-Leahy Scale: Fair Standing balance comment: static stands without support                            Cognition Arousal/Alertness: Awake/alert Behavior During Therapy: WFL for tasks assessed/performed Overall Cognitive Status: Within Functional Limits for tasks assessed                                 General Comments: very cheerful, pleasant and motivated        Exercises General Exercises - Lower Extremity Ankle Circles/Pumps: AROM, Both, 10 reps Quad Sets: AROM, Both, 5 reps Gluteal Sets:  AROM, Strengthening, Both, 5 reps    General Comments        Pertinent Vitals/Pain Pain Assessment Pain Assessment: No/denies pain    Home Living                          Prior Function            PT Goals (current goals can now be found in the care plan section) Acute Rehab PT Goals Patient Stated Goal: to get stronger PT Goal Formulation: With patient Time For Goal Achievement: 08/20/22 Potential to Achieve  Goals: Fair Progress towards PT goals: Progressing toward goals    Frequency    Min 2X/week      PT Plan Current plan remains appropriate    Co-evaluation              AM-PAC PT "6 Clicks" Mobility   Outcome Measure  Help needed turning from your back to your side while in a flat bed without using bedrails?: A Little Help needed moving from lying on your back to sitting on the side of a flat bed without using bedrails?: A Little Help needed moving to and from a bed to a chair (including a wheelchair)?: A Little Help needed standing up from a chair using your arms (e.g., wheelchair or bedside chair)?: A Little Help needed to walk in hospital room?: A Little Help needed climbing 3-5 steps with a railing? : A Lot 6 Click Score: 17    End of Session Equipment Utilized During Treatment: Gait belt Activity Tolerance: Patient tolerated treatment well Patient left: in chair;with call bell/phone within reach;with chair alarm set Nurse Communication: Mobility status PT Visit Diagnosis: Unsteadiness on feet (R26.81);Adult, failure to thrive (R62.7);Muscle weakness (generalized) (M62.81);Difficulty in walking, not elsewhere classified (R26.2)     Time: 0488-8916 PT Time Calculation (min) (ACUTE ONLY): 24 min  Charges:  $Gait Training: 23-37 mins                     Baxter Flattery, PT  Acute Rehab Dept [facility]) [phone]  [facility] Weekend Pager [facility] only)  [phone]  08/17/2022    [facility] 08/17/2022, 2:57 PM

## 2022-08-17 NOTE — Progress Notes (Signed)
Patient refused midnight CBG check 

## 2022-08-17 NOTE — Progress Notes (Signed)
PCCM Progress Note   Met with pt and mother, spoke with pt's sister over the phone about management of her pigtail chest tube. To me it appears that it has served its purpose for treating empyema. She has been afebrile, her WBC count is normal, tube output appears simple/straw colored and repeat culture from tube is negative to date. I worry about leaving it in any longer than we absolutely have to due to its potential as a portal for infection, the pain it can cause, and the limits it places on her mobility. We can't necessarily use chest tube output as a guide for removal due to her increased fluid burden from recurrent MPE. If we take tube out I think she may need another pleural intervention for symptomatic pleural effusion fairly quickly (?2-5 days or so based on recent drainage in chest tube). Not sure what best next pleural intervention would be though since she's still undergoing ABX for staph empyema. She's nearing 2 weeks of appropriate IV antibiotics and I think it would be reasonable to remain on IV while inpatient but could transition soon if needed to PO option like keflex to complete a 6 week course. I would probably offer PleurX as an option for symptomatic recurrent MPE while she is still on her antibiotic course but I'm not sure this would be offered by all partners within my practice (instead perhaps others would recommend repeated thoras or another pigtail until ABX course is completed). This does however potentially limit her disposition options as SNF will not accept PleurX. Challenging scenario. They wanted to make sure we discussed all of this with Dr. Alvy Bimler to make shared decision about best approach.  [place]

## 2022-08-17 NOTE — Progress Notes (Signed)
PROGRESS NOTE  [name]  AYT:[id number] DOB: [birth year] DOA: 08/02/2022 PCP: Heath Lark, MD   Brief Narrative:  Patient is a 61 year old female with history of right breast cancer, chronic right pleural effusion, chronic cancer related pain, malnutrition who presented from home with complaints of fatigue, generalized weakness, loss of appetite.  On presentation, she was afebrile.  Lab work showed WBC count of 11.2, hemoglobin 9.5.  Chest x-ray concerning for increased right pleural effusion with complete opacification of the right hemithorax.  Prolonged hospital course needing transfer to ICU now back to the floor. Important events:  11/4-admit to the hospital. 11/5-right-sided thoracentesis 800 mL removed, LDH 439, white cell 84%.  MSSA from the pleural fluid. 11/7-PCCM consulted and transferred to ICU, started on vasopressors with left groin central line.  Treated with BiPAP. 11/8-worsening pleural effusion, emergent thoracentesis done persistent staff. 11/10-pigtail catheter was placed and currently getting Lasix through the tube. 11/12 transfer to [facility] service.   Remains on pigtail catheter with tPA.  Remains on antibiotics. Remains in the hospital, chest tube management, IV antibiotics.  Frailty and debility. Chest tube is actively draining.  Assessment & Plan:  Principal Problem:   Pleural effusion on right Active Problems:   Breast cancer, stage 4, right (HCC)   Cancer associated pain   Physical deconditioning   Protein calorie malnutrition (HCC)   Pleural effusion, right   Malignant pleural effusion   Need for emotional support   Constipation   Palliative care encounter   Protein-calorie malnutrition, severe   Empyema, right ([place])   Need for management of chest tube   MSSA (methicillin susceptible Staphylococcus aureus) infection  Recurrent right-sided pleural effusion: Secondary to breast cancer.  On presentation,Chest x-ray showed complete opacification of right  hemithorax, Leftward shift of the trachea and mediastinal structures.  Status post chest tube placement.  Chest imaging shows trapped lung, left pleural effusion.  Not a candidate for VATS.  PCCM team following.    Septic shock/MSSA empyema: Continue current antibiotics.  Sepsis physiology has improved.  Started on Solu-Cortef, will continue to taper.  Fluid culture showed MSSA.  Repeat cultures on 11/14 with no growth.  Still has mild leukocytosis..  Transient A-fib: Likely secondary to respiratory distress from empyema. .  CHA2DS2VAsc of 1.remains in mild sinus tachycardia.   Breast cancer: Followed by Dr. Alvy Bimler.  On Verzenio and Arimidex at home.  Continue anastrozole for now.   Chronic cancer rated pain: Continue oral Dilaudid as needed  Protein calorie malnutrition: On Remeron ,megace.nutritionist following   Normocytic anemia: Currently hemoglobin stable.  Most likely associated with malignancy   Debility: Having weakness, fatigue over past few weeks, difficulty in ambulation.  PT is recommending SNF.  Oncology discussed goals of care.  CODE STATUS is DNR.  She is hospice appropriate but she is not agreeable yet.  Plan is for SNF discharge after  chest tube removal.      Nutrition Problem: Severe Malnutrition Etiology: chronic illness    DVT prophylaxis:enoxaparin (LOVENOX) injection 40 mg Start: 08/06/22 2200 Place and maintain sequential compression device Start: 08/06/22 1943     Code Status: DNR  Family Communication: None at bedside  Patient status:Inpatient  Patient is from :Home  Anticipated discharge to:Not sure  Estimated DC date:Not sure   Consultants: PCCM, ID  Procedures: Thoracentesis this  Antimicrobials:  Anti-infectives (From admission, onward)    Start     Dose/Rate Route Frequency Ordered Stop   08/07/22 0600  linezolid (ZYVOX) IVPB 600 mg  Status:  Discontinued        600 mg 300 mL/hr over 60 Minutes Intravenous Every 12 hours 08/05/22  1106 08/06/22 0807   08/07/22 0500  vancomycin (VANCOCIN) IVPB 1000 mg/200 mL premix  Status:  Discontinued        1,000 mg 200 mL/hr over 60 Minutes Intravenous Every 48 hours 08/05/22 0805 08/05/22 0845   08/07/22 0500  vancomycin (VANCOCIN) IVPB 1000 mg/200 mL premix  Status:  Discontinued        1,000 mg 200 mL/hr over 60 Minutes Intravenous Every 48 hours 08/05/22 0853 08/05/22 1106   08/06/22 1800  linezolid (ZYVOX) IVPB 600 mg  Status:  Discontinued        600 mg 300 mL/hr over 60 Minutes Intravenous Every 12 hours 08/06/22 0807 08/06/22 1014   08/06/22 1200  ceFAZolin (ANCEF) IVPB 2g/100 mL premix        2 g 200 mL/hr over 30 Minutes Intravenous Every 8 hours 08/06/22 1055     08/06/22 1100  linezolid (ZYVOX) IVPB 600 mg  Status:  Discontinued        600 mg 300 mL/hr over 60 Minutes Intravenous Every 12 hours 08/06/22 1014 08/06/22 1052   08/06/22 0600  ceFEPIme (MAXIPIME) 2 g in sodium chloride 0.9 % 100 mL IVPB  Status:  Discontinued        2 g 200 mL/hr over 30 Minutes Intravenous Every 24 hours 08/05/22 0805 08/05/22 1022   08/05/22 1700  vancomycin (VANCOREADY) IVPB 500 mg/100 mL  Status:  Discontinued        500 mg 100 mL/hr over 60 Minutes Intravenous Every 12 hours 08/05/22 0350 08/05/22 0805   08/05/22 0500  ceFEPIme (MAXIPIME) 2 g in sodium chloride 0.9 % 100 mL IVPB  Status:  Discontinued        2 g 200 mL/hr over 30 Minutes Intravenous Every 8 hours 08/05/22 0347 08/05/22 0805   08/05/22 0445  vancomycin (VANCOCIN) IVPB 1000 mg/200 mL premix        1,000 mg 200 mL/hr over 60 Minutes Intravenous  Once 08/05/22 0347 08/05/22 8101       Subjective: Patient seen and examined at bedside today.  Hemodynamically stable.  Lying in the bed.  Eating her breakfast.  She was on room air.  Right-sided chest tube in place.  Denies any worsening shortness of breath or cough.  She says she ambulated yesterday.  Looks comfortable, no questions at the moment  Objective: Vitals:    08/16/22 0629 08/16/22 1420 08/16/22 2155 08/17/22 0546  BP: 110/70 110/66 115/74 118/70  Pulse: (!) 104 (!) 109 (!) 113 (!) 107  Resp: '18 18 18 19  '$ Temp: 98.4 F (36.9 C) 98.1 F (36.7 C) 98.6 F (37 C) 97.7 F (36.5 C)  TempSrc: Oral Oral Oral Oral  SpO2: 100% 99% 99% 99%  Weight:      Height:        Intake/Output Summary (Last 24 hours) at 08/17/2022 0804 Last data filed at 08/17/2022 0600 Gross per 24 hour  Intake 716.96 ml  Output 475 ml  Net 241.96 ml   Filed Weights   08/11/22 0419 08/12/22 0500 08/15/22 0500  Weight: 60.5 kg 59.5 kg 59.2 kg    Examination:  General exam: Overall comfortable, not in distress, deconditioned, chronically looking HEENT: PERRL Respiratory system: Diminished air sounds bilaterally more on the right, right side chest tube Cardiovascular system: S1 & S2 heard, RRR.  Gastrointestinal system: Abdomen is nondistended, soft  and nontender. Central nervous system: Alert and oriented Extremities: No edema, no clubbing ,no cyanosis Skin: No rashes, no ulcers,no icterus     Data Reviewed: I have personally reviewed following labs and imaging studies  CBC: Recent Labs  Lab 08/11/22 1052 08/12/22 0241 08/13/22 2350 08/16/22 0033  WBC 15.3* 14.8* 16.0* 13.5*  HGB 9.9* 9.2* 9.0* 9.2*  HCT 31.7* 28.1* 28.3* 29.7*  MCV 83.2 80.1 83.0 83.7  PLT 483* 461* 517* 542*   Basic Metabolic Panel: Recent Labs  Lab 08/11/22 1052 08/12/22 0241 08/12/22 2034 08/13/22 1228 08/15/22 1439  NA 142 142  --  140 141  K 3.3* 4.2  --  3.9 4.0  CL 107 112*  --  108 109  CO2 25 25  --  27 29  GLUCOSE 204* 127*  --  179* 184*  BUN 22 23  --  19 24*  CREATININE 0.64 0.56  --  0.65 0.67  CALCIUM 7.2* 7.1*  --  6.6* 7.0*  MG  --  1.8 2.5*  --   --   PHOS  --  1.3* 1.9*  --   --      Recent Results (from the past 240 hour(s))  Body fluid culture w Gram Stain     Status: None   Collection Time: 08/12/22  2:44 PM   Specimen: Pleura; Body Fluid   Result Value Ref Range Status   Specimen Description   Final    PLEURAL RIGHT Performed at [facility], [address], [place], [place] [address]    Special Requests   Final    Immunocompromised Performed at [facility], [place] [address], [place], [place] [address]    Gram Stain   Final    FEW WBC PRESENT, PREDOMINANTLY MONONUCLEAR NO ORGANISMS SEEN    Culture   Final    NO GROWTH 3 DAYS Performed at [facility] [address], [place],  [address]    Report Status 08/15/2022 FINAL  Final     Radiology Studies: DG CHEST PORT 1 VIEW  Result Date: 08/15/2022 CLINICAL DATA:  142230 Pleural effusion 142230 EXAM: PORTABLE CHEST 1 VIEW COMPARISON:  August 14, 2022 FINDINGS: The cardiomediastinal silhouette is unchanged in contour.RIGHT-sided chest tube. RIGHT-sided hydropneumothorax with a mildly decreased fluid component in comparison to prior thereby mildly increased conspicuity of the air component. Moderate LEFT pleural effusion. Bibasilar opacities, similar in comparison to prior. Revisualization of a peripheral RIGHT rind of airspace opacities. IMPRESSION: 1. RIGHT-sided hydropneumothorax with mild decrease in the fluid component and increased conspicuity of the air component. There is likely a corresponding corticated RIGHT lung. 2. Moderate LEFT pleural effusion, similar in comparison to prior. Electronically Signed   By: Valentino Saxon M.D.   On: 08/15/2022 09:52    Scheduled Meds:  anastrozole  1 mg Oral Daily   bisacodyl  10 mg Rectal Once   calcium carbonate  1 tablet Oral BID   enoxaparin (LOVENOX) injection  40 mg Subcutaneous Q24H   famotidine  20 mg Oral Daily   feeding supplement  237 mL Oral TID BM   hydrocortisone sod succinate (SOLU-CORTEF) inj  50 mg Intravenous Daily   megestrol  40 mg Oral BID   metroNIDAZOLE   Topical Q1400   mirtazapine  7.5 mg Oral QHS   multivitamin with minerals  1 tablet Oral Daily    polyethylene glycol  17 g Oral Daily   senna  2 tablet Oral BID  sodium chloride flush  10 mL Intrapleural Q8H   sodium chloride flush  3 mL Intravenous Q12H   Continuous Infusions:  sodium chloride     sodium chloride Stopped (08/14/22 2300)    ceFAZolin (ANCEF) IV 2 g (08/17/22 0446)     LOS: 13 days   Shelly Coss, MD [facility] P11/19/2023, 8:04 AM

## 2022-08-17 NOTE — Progress Notes (Signed)
Pt refused CBG check and dressing change MB Adhikari notified

## 2022-08-18 ENCOUNTER — Inpatient Hospital Stay (HOSPITAL_COMMUNITY): Payer: BC Managed Care – PPO

## 2022-08-18 DIAGNOSIS — J90 Pleural effusion, not elsewhere classified: Secondary | ICD-10-CM | POA: Diagnosis not present

## 2022-08-18 MED ORDER — BOOST / RESOURCE BREEZE PO LIQD CUSTOM
1.0000 | Freq: Four times a day (QID) | ORAL | Status: DC
  Administered 2022-08-18 – 2022-08-30 (×38): 1 via ORAL
  Filled 2022-08-18: qty 1

## 2022-08-18 NOTE — Progress Notes (Signed)
Calorie Count Note  48 hour calorie count ordered. Documentation lost during move from 2W. No documentation available on door.  Spoke with pt and reviewed flowsheets for intakes.  Diet: regular Supplements: Ensure Plus High Protein po BID, each supplement provides 350 kcal and 20 grams of protein.   11/17: Breakfast: not documented Lunch: 160 kcals, 8g protein Dinner: 250 kcals, 14g protein  Supplements: Boost Breeze x2 =500 kcals, 18g protein  Total intake: 910 kcal (54% of minimum estimated needs)  40g protein (50% of minimum estimated needs)  Patient reports she is now drinking ~4 Boost Breezes daily, providing 1000 kcals and 36g protein. Pt consuming ~50% of meals.  Today pt states she ate 2 bowls of oatmeal with brown sugar, consumed ~75% of bowls, provides ~395 kcals and 7g protein. Pt has already drank 2 Boost Breezes.  Nutrition Dx: Severe Malnutrition related to chronic illness as evidenced by percent weight loss, severe fat depletion, severe muscle depletion   Goal: Pt to meet >/= 90% of their estimated nutrition needs    Intervention:  -D/c Calorie count -Boost Breeze po QID, each supplement provides 250 kcal and 9 grams of protein  -Magic cup BID with meals, each supplement provides 290 kcal and 9 grams of protein  -Multivitamin with minerals daily -Encourage family to bring meals if pt prefers  Clayton Bibles, MS, RD, LDN Inpatient Clinical Dietitian Contact information available via Amion

## 2022-08-18 NOTE — Progress Notes (Signed)
NAME:  [name], MRN:  [id number], DOB:  08-31-1961, LOS: 57 ADMISSION DATE:  08/02/2022, CONSULTATION DATE:  08/05/22 REFERRING MD:  Gershon Cull , CHIEF COMPLAINT:   Lethargy, ams   BRIEF  61 year old with metastatic breast cancer, hypertension with recurrent right-sided malignant effusion presenting with decreased appetite, weakness and lethargy.  She underwent thoracentesis on admission showing MSSA empyema.  She underwent emergent thoracentesis 11/8.  Required vasopressors, weaned off.   Pertinent  Medical History  HTN Breast cancer - R chest wall, contralateral breast (L. Mets to lung and bones.  Responding well to chemo/antiestrogen.   Failutre to thrive, weight loss, malignant cachexia  Malignant pleural effusion R   Home meds: abemaciclib, arimidex, calcium carbonate, vit D, Dilaudid '8mg'$  q6 prn  Mag oxide, mirtazapine 7.'5mg'$  qhs  Miralax, senna  Flagyl gel   Significant Hospital Events: Including procedures, antibiotic start and stop dates in addition to other pertinent events   03/23/2022: Right-sided thoracentesis: LDH 259.  White cells 207.  8% polymorphs. Malignant cells consistent with breast cancer present 08/02/2022 - admit MRSA PCR neg 08/03/2022: Right thoracentesis 800 mL and stopped due to pain LDH 439, white cell 7100/84% polymorphs > huge increase in white count and left shift. MSSA gorwing from pleural fluids 08/03/22 08/05/22 -PCCM consult and ICU tx Left groin CVL Left groin a-line  08/05/22 -last Narcan was at 4 AM.  Now completely awake on BiPAP.  Wants BiPAP off.  No respiratory distress.  Remains on vasopressors Levophed 9 mcg and vasopressin.  Diastolic is low at 40 with map of 71.  She has left femoral arterial line and central line.  She is oriented and communicating.  She remains afebrile.  White count is high.  She is on antibiotics vancomycin and cefepime. ECHO - ef65%. RSVP 45+., Mild to mod TR. Mild MR Family not in favor for pleurx. Dr Alvy Bimler  recommending 1 more thora 08/06/22 worsening effusion with ?tension physiology s/p emergent thora. Repeat pleural Cx showing staph 11/10 after extensive patient with family informed decision made for right pigtail catheter by IR 11/12 CT drainage 1070. S/p tPA, dornase 11/13 CT drainage 1L, s/p tPA, dornase 11/15 tpa, dornase for decreased chest tube output 11/20 CT drainage 450 cc  Interim History / Subjective:  Chest tube output in the last 24 hours 450cc  Objective   Blood pressure 99/67, pulse (!) 105, temperature 100.3 F (37.9 C), temperature source Oral, resp. rate 20, height '5\' 6"'$  (1.676 m), weight 68.9 kg, SpO2 100 %.        Intake/Output Summary (Last 24 hours) at 08/18/2022 0569 Last data filed at 08/17/2022 1920 Gross per 24 hour  Intake 900 ml  Output --  Net 900 ml   Filed Weights   08/12/22 0500 08/15/22 0500 08/18/22 0500  Weight: 59.5 kg 59.2 kg 68.9 kg   Physical Exam: General: Well-appearing, no acute distress, on room air HENT: [place], AT, OP clear, MMM Eyes: EOMI, no scleral icterus Respiratory: Diminished bibasilar breath sounds, no wheezing or rhonchi Cardiovascular: RRR, -M/R/G, no JVD GI: BS+, soft, nontender Extremities:-Edema,-tenderness Neuro: AAO x4, CNII-XII grossly intact  CXR 08/18/22 Increased right lateral densities, Right hydropneumothorax unchanged. Unchanged left pleural effusion  Resolved Hospital Problem list    Malignant right pleural effusion  MSSA empyema Acute respiratory failure with hypoxia due to bilateral pleural effusion Trapped lung on the R w/ hydropneumothorax - S/p tPA and dornase x 3 with good response.  Improved but continued high chest tube output likely  related to malignant pleural effusion and poor nutritional status. - Afebrile >72 hours and mildly elevated but improving leukocytosis. No O2 requirement. - Recommend 6 week course of ABX - ancef while inpatient, consider transition to augmentin as nears discharge -  Nearing stable for chest tube removal however output and CXR today is concerning regarding the need for repeated pleural intervention in the future. Discussed patient risks and benefits of PleurX catheter. She will likely need device and is aware of risk of infection in setting of treating empyema. However our options are limited if she has rapid fluid accumulation. I have discussed situation with her Oncologist per family request who agree with current Pulmonary recommendations. Plan: CXR tomorrow. May pursue CT Chest without contrast tomorrow Discuss with patient and family regarding PleurX catheter  Metastatic breast cancer -mgmt per Dr. Alvy Bimler  -Need to continue to focus aggressively on nutrition and rehabilitation  Goals of Care -DNR after discussion with Dr. Alvy Bimler  -ongoing discussions -prognosis remains guarded with co-morbidities, poor nutrition, advanced cancer   Pulmonary will continue to follow   Best Practice (right click and "Reselect all SmartList Selections" daily)   Per [facility]   Signature:  Care Time: 50 min  Gilbert Manolis Rodman Pickle 08/18/22 9:17 AM [facility]

## 2022-08-18 NOTE — TOC Progression Note (Signed)
Transition of Care [facility]) - Progression Note    Patient Details  Name: [name] MRN: [id number] Date of Birth: 1960-12-08  Transition of Care [facility]) CM/SW Contact  Lauri Purdum, Juliann Pulse, RN Phone Number: 08/18/2022, 3:37 PM  Clinical Narrative:  Awaiting removal of Chest tube for appropriateness for ST SNF-patient agrees.      Expected Discharge Plan: [place] Barriers to Discharge: Continued Medical Work up  Expected Discharge Plan and Services Expected Discharge Plan: [place]   Discharge Planning Services: CM Consult   Living arrangements for the past 2 months: Apartment                                       Social Determinants of Health (SDOH) Interventions    Readmission Risk Interventions    08/06/2022   10:01 AM  Readmission Risk Prevention Plan  Medication Review (RN Care Manager) Complete  PCP or Specialist appointment within 3-5 days of discharge Complete  HRI or Home Care Consult Complete  SW Recovery Care/Counseling Consult Complete  Palliative Care Screening Not Dane Complete

## 2022-08-18 NOTE — Progress Notes (Signed)
Pt refused lunch time CBG check. MD notified.

## 2022-08-18 NOTE — Progress Notes (Signed)
Physical Therapy Treatment Patient Details Name: [name] MRN: [id number] DOB: 1961/05/06 Today's Date: 08/18/2022   History of Present Illness s Doto is a 61 yo woman with a hx of metastatic breast cancer, HTN, admitted 11/4 with decreased appetite, poor po intake , weakness, lethargy,persistent pleural effusion despite thoracentesis, FTT, transfer to iCU 11/6 for decreased response and  low BP,  right CT placed .    PT Comments    General Comments: AxO x 3 pleasant Lady, motivated, eager. Asked RN to disconnect Chest Tube from wall suction.  Assisted OOB to amb was difficult.  General bed mobility comments: Mod Assist to transition to EOB with increased time and use of bed pad to complete scooting to EOB. General transfer comment: multi-modal cues to scoot to edge, push from chair with LUE, RUE on RW and to power up with LEs; incr assist required on second trial d/t fatigue  Tolerated an increased distance but required an extended rest break after due to increased dyspnea.  Pt no where need her prior level of amb without any AD and "I am use to walking two miles a day".  Pt will need ST Rehab at SNF to address mobility and functional decline prior to safely returning home.   Recommendations for follow up therapy are one component of a multi-disciplinary discharge planning process, led by the attending physician.  Recommendations may be updated based on patient status, additional functional criteria and insurance authorization.  Follow Up Recommendations  Skilled nursing-short term rehab (<3 hours/day) Can patient physically be transported by private vehicle: No   Assistance Recommended at Discharge    Patient can return home with the following A lot of help with walking and/or transfers;A lot of help with bathing/dressing/bathroom;Assistance with cooking/housework;Assist for transportation   Equipment Recommendations  None recommended by PT    Recommendations for Other Services        Precautions / Restrictions Precautions Precautions: Fall Precaution Comments: R CT to wall suction Restrictions Weight Bearing Restrictions: No     Mobility  Bed Mobility Overal bed mobility: Needs Assistance Bed Mobility: Supine to Sit     Supine to sit: Mod assist     General bed mobility comments: Mod Assist to transition to EOB with increased time and use of bed pad to complete scooting to EOB.    Transfers Overall transfer level: Needs assistance Equipment used: Rolling walker (2 wheels) Transfers: Sit to/from Stand Sit to Stand: Min assist, Mod assist           General transfer comment: multi-modal cues to scoot to edge, push from chair with LUE, RUE on RW and to power up with LEs; incr assist required on secodn trial d/t fatigue    Ambulation/Gait Ambulation/Gait assistance: Min guard, +2 safety/equipment Gait Distance (Feet): 82 Feet Assistive device: Rolling walker (2 wheels) Gait Pattern/deviations: Step-through pattern, Narrow base of support, Trunk flexed Gait velocity: decreased    Tolerated an increased distance but required an extended rest break after due to increased dyspnea.  Pt no where need her prior level of amb without any AD and "I am use to walking two miles a day".    Stairs             Wheelchair Mobility    Modified Rankin (Stroke Patients Only)       Balance  Cognition Arousal/Alertness: Awake/alert Behavior During Therapy: WFL for tasks assessed/performed Overall Cognitive Status: Within Functional Limits for tasks assessed                                 General Comments: AxO x 3 pleasant Lady, motivated, eager.        Exercises      General Comments        Pertinent Vitals/Pain Pain Assessment Pain Assessment: Faces Faces Pain Scale: Hurts little more Pain Location: CT site Pain Descriptors / Indicators: Grimacing Pain  Intervention(s): Monitored during session    Home Living                          Prior Function            PT Goals (current goals can now be found in the care plan section) Progress towards PT goals: Progressing toward goals    Frequency    Min 2X/week      PT Plan Current plan remains appropriate    Co-evaluation              AM-PAC PT "6 Clicks" Mobility   Outcome Measure  Help needed turning from your back to your side while in a flat bed without using bedrails?: A Lot Help needed moving from lying on your back to sitting on the side of a flat bed without using bedrails?: A Lot Help needed moving to and from a bed to a chair (including a wheelchair)?: A Lot Help needed standing up from a chair using your arms (e.g., wheelchair or bedside chair)?: A Lot Help needed to walk in hospital room?: A Lot Help needed climbing 3-5 steps with a railing? : A Lot 6 Click Score: 12    End of Session Equipment Utilized During Treatment: Gait belt Activity Tolerance: Patient tolerated treatment well Patient left: with call bell/phone within reach;in bed;with bed alarm set Nurse Communication: Mobility status PT Visit Diagnosis: Unsteadiness on feet (R26.81);Adult, failure to thrive (R62.7);Muscle weakness (generalized) (M62.81);Difficulty in walking, not elsewhere classified (R26.2)     Time: 1035-1050 PT Time Calculation (min) (ACUTE ONLY): 15 min  Charges:  $Gait Training: 8-22 mins                     Rica Koyanagi  PTA [place] Office M-F          [phone] Weekend pager [phone]

## 2022-08-18 NOTE — Progress Notes (Signed)
PROGRESS NOTE  [name]  MGQ:[id number] DOB: 12-Feb-1961 DOA: 08/02/2022 PCP: Heath Lark, MD   Brief Narrative:  Patient is a 61 year old female with history of right breast cancer, chronic right pleural effusion, chronic cancer related pain, malnutrition who presented from home with complaints of fatigue, generalized weakness, loss of appetite.  On presentation, she was afebrile.  Lab work showed WBC count of 11.2, hemoglobin 9.5.  Chest x-ray concerning for increased right pleural effusion with complete opacification of the right hemithorax.  Prolonged hospital course needing transfer to ICU now back to the floor. Important events:  11/4-admit to the hospital. 11/5-right-sided thoracentesis 800 mL removed, LDH 439, white cell 84%.  MSSA from the pleural fluid. 11/7-PCCM consulted and transferred to ICU, started on vasopressors with left groin central line.  Treated with BiPAP. 11/8-worsening pleural effusion, emergent thoracentesis done persistent staff. 11/10-pigtail catheter was placed and currently getting Lasix through the tube. 11/12 transfer to [facility] service.   Remains on pigtail catheter with tPA.   Remains in the hospital, chest tube management, IV antibiotics.  Frailty and debility. Chest tube is actively draining.  Assessment & Plan:  Principal Problem:   Pleural effusion on right Active Problems:   Breast cancer, stage 4, right (HCC)   Cancer associated pain   Physical deconditioning   Protein calorie malnutrition (HCC)   Pleural effusion, right   Malignant pleural effusion   Need for emotional support   Constipation   Palliative care encounter   Protein-calorie malnutrition, severe   Empyema, right ([place])   Need for management of chest tube   MSSA (methicillin susceptible Staphylococcus aureus) infection  Recurrent right-sided pleural effusion: Secondary to breast cancer.  On presentation,Chest x-ray showed complete opacification of right hemithorax, Leftward  shift of the trachea and mediastinal structures.  Status post chest tube placement.  Chest imaging shows trapped lung, left pleural effusion.  Not a candidate for VATS.  PCCM team following.  Chest tube actively draining.  Not a candidate for Pleurx right now being on antibiotics for empyema.  Septic shock/MSSA empyema: Continue current antibiotics.  Sepsis physiology has improved.  Started on Solu-Cortef, will continue to taper.  Fluid culture showed MSSA.  Repeat cultures on 11/14 with no growth.  Still has mild leukocytosis.  Current plan is to continue 2 weeks of IV antibiotics and change  to Keflex to complete a 6-week course or  simply continuing IV antibiotics while inpatient  Transient A-fib: Likely secondary to respiratory distress from empyema. .  CHA2DS2VAsc of 1.remains in mild sinus tachycardia.   Breast cancer: Followed by Dr. Alvy Bimler.  On Verzenio and Arimidex at home.  Continue anastrozole for now.   Chronic cancer rated pain: Continue oral Dilaudid as needed  Protein calorie malnutrition: On Remeron ,megace.nutritionist following   Normocytic anemia: Currently hemoglobin stable.  Most likely associated with malignancy   Debility: Having weakness, fatigue over past few weeks, difficulty in ambulation.  PT is recommending SNF.  Oncology discussed goals of care.  CODE STATUS is DNR.  She is hospice appropriate but she is not agreeable yet.  Plan is for SNF discharge after  chest tube removal.Not sure when that will happen      Nutrition Problem: Severe Malnutrition Etiology: chronic illness    DVT prophylaxis:enoxaparin (LOVENOX) injection 40 mg Start: 08/06/22 2200 Place and maintain sequential compression device Start: 08/06/22 1943     Code Status: DNR  Family Communication: None at bedside  Patient status:Inpatient  Patient is from :Home  Anticipated  discharge to:Not sure  Estimated DC date:Not sure   Consultants: PCCM, ID  Procedures: Thoracentesis    Antimicrobials:  Anti-infectives (From admission, onward)    Start     Dose/Rate Route Frequency Ordered Stop   08/07/22 0600  linezolid (ZYVOX) IVPB 600 mg  Status:  Discontinued        600 mg 300 mL/hr over 60 Minutes Intravenous Every 12 hours 08/05/22 1106 08/06/22 0807   08/07/22 0500  vancomycin (VANCOCIN) IVPB 1000 mg/200 mL premix  Status:  Discontinued        1,000 mg 200 mL/hr over 60 Minutes Intravenous Every 48 hours 08/05/22 0805 08/05/22 0845   08/07/22 0500  vancomycin (VANCOCIN) IVPB 1000 mg/200 mL premix  Status:  Discontinued        1,000 mg 200 mL/hr over 60 Minutes Intravenous Every 48 hours 08/05/22 0853 08/05/22 1106   08/06/22 1800  linezolid (ZYVOX) IVPB 600 mg  Status:  Discontinued        600 mg 300 mL/hr over 60 Minutes Intravenous Every 12 hours 08/06/22 0807 08/06/22 1014   08/06/22 1200  ceFAZolin (ANCEF) IVPB 2g/100 mL premix        2 g 200 mL/hr over 30 Minutes Intravenous Every 8 hours 08/06/22 1055     08/06/22 1100  linezolid (ZYVOX) IVPB 600 mg  Status:  Discontinued        600 mg 300 mL/hr over 60 Minutes Intravenous Every 12 hours 08/06/22 1014 08/06/22 1052   08/06/22 0600  ceFEPIme (MAXIPIME) 2 g in sodium chloride 0.9 % 100 mL IVPB  Status:  Discontinued        2 g 200 mL/hr over 30 Minutes Intravenous Every 24 hours 08/05/22 0805 08/05/22 1022   08/05/22 1700  vancomycin (VANCOREADY) IVPB 500 mg/100 mL  Status:  Discontinued        500 mg 100 mL/hr over 60 Minutes Intravenous Every 12 hours 08/05/22 0350 08/05/22 0805   08/05/22 0500  ceFEPIme (MAXIPIME) 2 g in sodium chloride 0.9 % 100 mL IVPB  Status:  Discontinued        2 g 200 mL/hr over 30 Minutes Intravenous Every 8 hours 08/05/22 0347 08/05/22 0805   08/05/22 0445  vancomycin (VANCOCIN) IVPB 1000 mg/200 mL premix        1,000 mg 200 mL/hr over 60 Minutes Intravenous  Once 08/05/22 0347 08/05/22 9379       Subjective:  Seen and examined at bedside today.  Hemodynamically  stable.  Remains on room air.  Eating her breakfast.  Lying in bed.  Denies any new complaints today.  Chest tube in place  Objective: Vitals:   08/18/22 0500 08/18/22 0625 08/18/22 0815 08/18/22 1117  BP:  97/63 99/67 137/81  Pulse:  (!) 104 (!) 105 99  Resp:  '20 20 19  '$ Temp:  98.3 F (36.8 C) 100.3 F (37.9 C) 98.6 F (37 C)  TempSrc:  Oral Oral Oral  SpO2:  100% 100% 100%  Weight: 68.9 kg     Height:        Intake/Output Summary (Last 24 hours) at 08/18/2022 1120 Last data filed at 08/17/2022 1920 Gross per 24 hour  Intake 890 ml  Output --  Net 890 ml   Filed Weights   08/12/22 0500 08/15/22 0500 08/18/22 0500  Weight: 59.5 kg 59.2 kg 68.9 kg    Examination:  General exam: Overall comfortable, not in distress, very deconditioned, weak, chronically ill looking HEENT: PERRL Respiratory system:  Diminished air sounds bilaterally but more on the right, right-sided chest tube  Cardiovascular system: S1 & S2 heard, RRR.  Gastrointestinal system: Abdomen is nondistended, soft and nontender. Central nervous system: Alert and oriented Extremities: No edema, no clubbing ,no cyanosis Skin: No rashes, no ulcers,no icterus      Data Reviewed: I have personally reviewed following labs and imaging studies  CBC: Recent Labs  Lab 08/12/22 0241 08/13/22 2350 08/16/22 0033  WBC 14.8* 16.0* 13.5*  HGB 9.2* 9.0* 9.2*  HCT 28.1* 28.3* 29.7*  MCV 80.1 83.0 83.7  PLT 461* 517* 921*   Basic Metabolic Panel: Recent Labs  Lab 08/12/22 0241 08/12/22 2034 08/13/22 1228 08/15/22 1439 08/17/22 1305  NA 142  --  140 141 142  K 4.2  --  3.9 4.0 3.9  CL 112*  --  108 109 109  CO2 25  --  '27 29 24  '$ GLUCOSE 127*  --  179* 184* 107*  BUN 23  --  19 24* 22  CREATININE 0.56  --  0.65 0.67 0.61  CALCIUM 7.1*  --  6.6* 7.0* 7.4*  MG 1.8 2.5*  --   --   --   PHOS 1.3* 1.9*  --   --   --      Recent Results (from the past 240 hour(s))  Body fluid culture w Gram Stain     Status:  None   Collection Time: 08/12/22  2:44 PM   Specimen: Pleura; Body Fluid  Result Value Ref Range Status   Specimen Description   Final    PLEURAL RIGHT Performed at [facility], [address], [place], [place] [address]    Special Requests   Final    Immunocompromised Performed at [facility], [place] [address], [place], [place] [address]    Gram Stain   Final    FEW WBC PRESENT, PREDOMINANTLY MONONUCLEAR NO ORGANISMS SEEN    Culture   Final    NO GROWTH 3 DAYS Performed at [facility] [address] [place], [place] [address]    Report Status 08/15/2022 FINAL  Final     Radiology Studies: DG CHEST PORT 1 VIEW  Result Date: 08/18/2022 CLINICAL DATA:  Pleural effusion. EXAM: PORTABLE CHEST 1 VIEW COMPARISON:  Chest radiograph 08/17/2022 FINDINGS: Right basilar chest tube is stable in position. Patient is rotated towards the right which limits evaluation of this examination. Patient had a small right apical pneumothorax on the previous examination which is probably unchanged but poorly characterized. Increased densities near the lateral right lung base could be related to overlying shadows but cannot exclude re-accumulation of pleural fluid. Persistent small to moderate sized left pleural effusion. Patchy parenchymal densities in both lungs compatible with atelectasis and difficult to exclude mild edema. Heart size is stable. IMPRESSION: 1. Limited examination due to patient rotation. 2. Right chest tube is stable in position. Patient had a small right apical pneumothorax on the previous examination which is probably unchanged but poorly characterized. 3. Increased densities near the lateral right lung base could be related to overlying shadows but cannot exclude re-accumulation of pleural fluid. 4. Persistent small to moderate sized left pleural effusion. Electronically Signed   By: Markus Daft M.D.   On: 08/18/2022 08:19   DG CHEST PORT 1 VIEW  Result  Date: 08/17/2022 CLINICAL DATA:  Follow-up pleural effusions. EXAM: PORTABLE CHEST 1 VIEW COMPARISON:  08/15/2022 FINDINGS: Right pleural pigtail catheter remains in place. Minimal residual  pleural fluid is noted, as well as a 5% right apical pneumothorax. Small left pleural effusion and left basilar atelectasis shows no significant change. Cardiomegaly remains stable. IMPRESSION: Minimal residual right pleural fluid and 5% right apical pneumothorax. Right pleural pigtail catheter remains in place. Stable small left pleural effusion and left basilar atelectasis. Electronically Signed   By: Marlaine Hind M.D.   On: 08/17/2022 14:12    Scheduled Meds:  anastrozole  1 mg Oral Daily   bisacodyl  10 mg Rectal Once   calcium carbonate  1 tablet Oral BID   enoxaparin (LOVENOX) injection  40 mg Subcutaneous Q24H   famotidine  20 mg Oral Daily   feeding supplement  237 mL Oral TID BM   hydrocortisone sod succinate (SOLU-CORTEF) inj  50 mg Intravenous Daily   megestrol  40 mg Oral BID   mirtazapine  7.5 mg Oral QHS   multivitamin with minerals  1 tablet Oral Daily   polyethylene glycol  17 g Oral Daily   senna  2 tablet Oral BID   sodium chloride flush  10 mL Intrapleural Q8H   sodium chloride flush  3 mL Intravenous Q12H   Continuous Infusions:  sodium chloride     sodium chloride Stopped (08/14/22 2300)    ceFAZolin (ANCEF) IV 2 g (08/18/22 0439)     LOS: 14 days   Shelly Coss, MD [facility] P11/20/2023, 11:20 AM

## 2022-08-19 ENCOUNTER — Inpatient Hospital Stay (HOSPITAL_COMMUNITY): Payer: BC Managed Care – PPO

## 2022-08-19 DIAGNOSIS — J90 Pleural effusion, not elsewhere classified: Secondary | ICD-10-CM | POA: Diagnosis not present

## 2022-08-19 LAB — CBC
HCT: 28.7 % — ABNORMAL LOW (ref 36.0–46.0)
Hemoglobin: 8.9 g/dL — ABNORMAL LOW (ref 12.0–15.0)
MCH: 26.7 pg (ref 26.0–34.0)
MCHC: 31 g/dL (ref 30.0–36.0)
MCV: 86.2 fL (ref 80.0–100.0)
Platelets: 542 10*3/uL — ABNORMAL HIGH (ref 150–400)
RBC: 3.33 MIL/uL — ABNORMAL LOW (ref 3.87–5.11)
RDW: 18.5 % — ABNORMAL HIGH (ref 11.5–15.5)
WBC: 13.3 10*3/uL — ABNORMAL HIGH (ref 4.0–10.5)
nRBC: 0 % (ref 0.0–0.2)

## 2022-08-19 NOTE — Progress Notes (Signed)
NAME:  [name], MRN:  [id number], DOB:  08/22/1961, LOS: 71 ADMISSION DATE:  08/02/2022, CONSULTATION DATE:  08/05/22 REFERRING MD:  Gershon Cull , CHIEF COMPLAINT:   Lethargy, ams   BRIEF  61 year old with metastatic breast cancer, hypertension with recurrent right-sided malignant effusion presenting with decreased appetite, weakness and lethargy.  She underwent thoracentesis on admission showing MSSA empyema.  She underwent emergent thoracentesis 11/8.  Required vasopressors, weaned off.   Pertinent  Medical History  HTN Breast cancer - R chest wall, contralateral breast (L. Mets to lung and bones.  Responding well to chemo/antiestrogen.   Failutre to thrive, weight loss, malignant cachexia  Malignant pleural effusion R   Home meds: abemaciclib, arimidex, calcium carbonate, vit D, Dilaudid '8mg'$  q6 prn  Mag oxide, mirtazapine 7.'5mg'$  qhs  Miralax, senna  Flagyl gel   Significant Hospital Events: Including procedures, antibiotic start and stop dates in addition to other pertinent events   03/23/2022: Right-sided thoracentesis: LDH 259.  White cells 207.  8% polymorphs. Malignant cells consistent with breast cancer present 08/02/2022 - admit MRSA PCR neg 08/03/2022: Right thoracentesis 800 mL and stopped due to pain LDH 439, white cell 7100/84% polymorphs > huge increase in white count and left shift. MSSA gorwing from pleural fluids 08/03/22 08/05/22 -PCCM consult and ICU tx Left groin CVL Left groin a-line  08/05/22 -last Narcan was at 4 AM.  Now completely awake on BiPAP.  Wants BiPAP off.  No respiratory distress.  Remains on vasopressors Levophed 9 mcg and vasopressin.  Diastolic is low at 40 with map of 71.  She has left femoral arterial line and central line.  She is oriented and communicating.  She remains afebrile.  White count is high.  She is on antibiotics vancomycin and cefepime. ECHO - ef65%. RSVP 45+., Mild to mod TR. Mild MR Family not in favor for pleurx. Dr Alvy Bimler  recommending 1 more thora 08/06/22 worsening effusion with ?tension physiology s/p emergent thora. Repeat pleural Cx showing staph 11/10 after extensive patient with family informed decision made for right pigtail catheter by IR 11/12 CT drainage 1070. S/p tPA, dornase 11/13 CT drainage 1L, s/p tPA, dornase 11/15 tpa, dornase for decreased chest tube output 11/20 CT drainage 450 cc 11/21 CT drainage 290 cc  Interim History / Subjective:  CT drainage 290 cc in the last 24 hours On room air  Objective   Blood pressure 101/70, pulse 97, temperature (!) 97.4 F (36.3 C), temperature source Oral, resp. rate 20, height '5\' 6"'$  (1.676 m), weight 68.9 kg, SpO2 99 %.        Intake/Output Summary (Last 24 hours) at 08/19/2022 1539 Last data filed at 08/19/2022 1300 Gross per 24 hour  Intake 367 ml  Output 890 ml  Net -523 ml   Filed Weights   08/12/22 0500 08/15/22 0500 08/18/22 0500  Weight: 59.5 kg 59.2 kg 68.9 kg   Physical Exam: General: Well-appearing, no acute distress, on room air HENT: [place], AT, OP clear, MMM Eyes: EOMI, no scleral icterus Respiratory: Diminished bibasilar breath sounds, no wheezing or rhonchi Cardiovascular: RRR, -M/R/G, no JVD GI: BS+, soft, nontender Extremities:-Edema,-tenderness Neuro: AAO x4, CNII-XII grossly intact  Physical Exam: General: Well-appearing, no acute distress HENT: [place], AT Eyes: EOMI, no scleral icterus Respiratory: Diminished bibasilar breath sounds bilaterally.  No crackles, wheezing or rales Cardiovascular: RRR, -M/R/G, no JVD Extremities:-Edema,-tenderness Neuro: AAO x4, CNII-XII grossly intact Psych: Normal mood, normal affect  CXR 08/18/22 Increased right lateral densities, Right hydropneumothorax unchanged. Unchanged  left pleural effusion CXR 08/18/22 Right pleural effusion, likely hydropneumothorax  Resolved Hospital Problem list    MSSA empyema Acute respiratory failure with hypoxia due to bilateral pleural  effusion Malignant right pleural effusion  Trapped lung on the R w/ hydropneumothorax - S/p tPA and dornase x 3 with good response.  Improved but continued high chest tube output likely related to malignant pleural effusion and poor nutritional status. - Afebrile >72 hours and mildly elevated but improving leukocytosis. No O2 requirement. - Recommend 6 week course of ABX - ancef while inpatient, consider transition to augmentin as nears discharge - 11/20 discussed patient risks and benefits of PleurX catheter. She will likely need device and is aware of risk of infection in setting of treating empyema. However our options are limited if she has rapid fluid accumulation. I have discussed situation with her Oncologist per family request who agree with current Pulmonary recommendations.  - Nearing chest tube removal with improved chest tube output. Right effusion seen on CXR, likely chronic when compared to imaging since June 2023.  Plan: - Chest tube to waterseal - CXR in am - Discuss with patient and family regarding PleurX catheter (sister) if fluid rapidly accumulates. Patient is reluctant but would consider pleurx however sister expresses concern regarding overall plan for management of pleural effusion (chemotherapy) and ensuring discharge plan to SNF that would accept PleurX catheter as patient does not have social support to perform drainage at home. I have contacted primary team regarding this.  Metastatic breast cancer -mgmt per Dr. Alvy Bimler. Discussed with team regarding family concerns about overall treatment. -Need to continue to focus aggressively on nutrition and rehabilitation  Goals of Care -DNR after discussion with Dr. Alvy Bimler  -ongoing discussions -prognosis remains guarded with co-morbidities, poor nutrition, advanced cancer   Pulmonary will continue to follow   Best Practice (right click and "Reselect all SmartList Selections" daily)   Per [facility]   Signature:  Care  Time: 50 min  Maeby Vankleeck Rodman Pickle 08/19/22 3:39 PM [facility]

## 2022-08-19 NOTE — Plan of Care (Signed)
  Problem: Education: Goal: Knowledge of General Education information will improve Description Including pain rating scale, medication(s)/side effects and non-pharmacologic comfort measures Outcome: Progressing   Problem: Health Behavior/Discharge Planning: Goal: Ability to manage health-related needs will improve Outcome: Progressing   

## 2022-08-19 NOTE — Progress Notes (Signed)
PROGRESS NOTE  [name]  URK:[id number] DOB: 06-Nov-1960 DOA: 08/02/2022 PCP: Heath Lark, MD   Brief Narrative:  Patient is a 61 year old female with history of right breast cancer, chronic right pleural effusion, chronic cancer related pain, malnutrition who presented from home with complaints of fatigue, generalized weakness, loss of appetite.  On presentation, she was afebrile.  Lab work showed WBC count of 11.2, hemoglobin 9.5.  Chest x-ray concerning for increased right pleural effusion with complete opacification of the right hemithorax.  Prolonged hospital course needing transfer to ICU now back to the floor. Important events:  11/4-admit to the hospital. 11/5-right-sided thoracentesis 800 mL removed, LDH 439, white cell 84%.  MSSA from the pleural fluid. 11/7-PCCM consulted and transferred to ICU, started on vasopressors with left groin central line.  Treated with BiPAP. 11/8-worsening pleural effusion, emergent thoracentesis done persistent staff. 11/10-pigtail catheter was placed and currently getting Lasix through the tube. 11/12 transfer to [facility] service.   Remains on pigtail catheter with tPA.   Remains in the hospital, chest tube management, IV antibiotics.  Frailty and debility. Chest tube is actively draining.  Assessment & Plan:  Principal Problem:   Pleural effusion on right Active Problems:   Breast cancer, stage 4, right (HCC)   Cancer associated pain   Physical deconditioning   Protein calorie malnutrition (HCC)   Pleural effusion, right   Malignant pleural effusion   Need for emotional support   Constipation   Palliative care encounter   Protein-calorie malnutrition, severe   Empyema, right ([place])   Need for management of chest tube   MSSA (methicillin susceptible Staphylococcus aureus) infection  Recurrent right-sided pleural effusion: Secondary to breast cancer.  On presentation,Chest x-ray showed complete opacification of right hemithorax, Leftward  shift of the trachea and mediastinal structures.  Status post chest tube placement. Not a candidate for VATS.  PCCM team following.  Chest tube actively draining.  Not a candidate for Pleurx right now being on antibiotics for empyema. Last chest x-ray also showing mild to moderate left pleural effusion.  Patient remains on room air.  Denies any worsening shortness of breath. Checking follow-up chest x-ray tomorrow.  Septic shock/MSSA empyema: Continue current antibiotics.  Sepsis physiology has improved.  Fluid culture showed MSSA.  Repeat cultures on 11/14 with no growth.  Still has mild leukocytosis.  Current plan is to continue 2 weeks of IV antibiotics and change  to Keflex to complete a 6-week course or  simply continuing IV antibiotics while inpatient  Transient A-fib: Likely secondary to respiratory distress from empyema. .  CHA2DS2VAsc of 1.remains in mild sinus tachycardia.   Breast cancer: Followed by Dr. Alvy Bimler.  On Verzenio and Arimidex at home.  Continue anastrozole for now.   Chronic cancer rated pain: Continue oral Dilaudid as needed  Protein calorie malnutrition: On Remeron ,megace.nutritionist following   Normocytic anemia: Currently hemoglobin stable.  Most likely associated with malignancy   Debility: Having weakness, fatigue over past few weeks, difficulty in ambulation.  PT is recommending SNF.  Oncology discussed goals of care.  CODE STATUS is DNR.  She is hospice appropriate but she is not agreeable yet.  Plan is for SNF discharge after  chest tube removal.Not sure when that will happen      Nutrition Problem: Severe Malnutrition Etiology: chronic illness    DVT prophylaxis:enoxaparin (LOVENOX) injection 40 mg Start: 08/06/22 2200 Place and maintain sequential compression device Start: 08/06/22 1943     Code Status: DNR  Family Communication: None at  bedside  Patient status:Inpatient  Patient is from :Home  Anticipated discharge to: SnF  Estimated DC  date:Not sure.Still has a chest tube   Consultants: PCCM, ID  Procedures: Thoracentesis ,chest tube placement  Antimicrobials:  Anti-infectives (From admission, onward)    Start     Dose/Rate Route Frequency Ordered Stop   08/07/22 0600  linezolid (ZYVOX) IVPB 600 mg  Status:  Discontinued        600 mg 300 mL/hr over 60 Minutes Intravenous Every 12 hours 08/05/22 1106 08/06/22 0807   08/07/22 0500  vancomycin (VANCOCIN) IVPB 1000 mg/200 mL premix  Status:  Discontinued        1,000 mg 200 mL/hr over 60 Minutes Intravenous Every 48 hours 08/05/22 0805 08/05/22 0845   08/07/22 0500  vancomycin (VANCOCIN) IVPB 1000 mg/200 mL premix  Status:  Discontinued        1,000 mg 200 mL/hr over 60 Minutes Intravenous Every 48 hours 08/05/22 0853 08/05/22 1106   08/06/22 1800  linezolid (ZYVOX) IVPB 600 mg  Status:  Discontinued        600 mg 300 mL/hr over 60 Minutes Intravenous Every 12 hours 08/06/22 0807 08/06/22 1014   08/06/22 1200  ceFAZolin (ANCEF) IVPB 2g/100 mL premix        2 g 200 mL/hr over 30 Minutes Intravenous Every 8 hours 08/06/22 1055     08/06/22 1100  linezolid (ZYVOX) IVPB 600 mg  Status:  Discontinued        600 mg 300 mL/hr over 60 Minutes Intravenous Every 12 hours 08/06/22 1014 08/06/22 1052   08/06/22 0600  ceFEPIme (MAXIPIME) 2 g in sodium chloride 0.9 % 100 mL IVPB  Status:  Discontinued        2 g 200 mL/hr over 30 Minutes Intravenous Every 24 hours 08/05/22 0805 08/05/22 1022   08/05/22 1700  vancomycin (VANCOREADY) IVPB 500 mg/100 mL  Status:  Discontinued        500 mg 100 mL/hr over 60 Minutes Intravenous Every 12 hours 08/05/22 0350 08/05/22 0805   08/05/22 0500  ceFEPIme (MAXIPIME) 2 g in sodium chloride 0.9 % 100 mL IVPB  Status:  Discontinued        2 g 200 mL/hr over 30 Minutes Intravenous Every 8 hours 08/05/22 0347 08/05/22 0805   08/05/22 0445  vancomycin (VANCOCIN) IVPB 1000 mg/200 mL premix        1,000 mg 200 mL/hr over 60 Minutes Intravenous   Once 08/05/22 0347 08/05/22 1194       Subjective:  Patient seen and examined at bedside today.  Remains on bed, hemodynamically stable.  On room air.  Denies any worsening shortness of breath or cough.  Eating her breakfast  Objective: Vitals:   08/18/22 1622 08/18/22 2041 08/19/22 0005 08/19/22 0453  BP: 107/83 121/71 103/71 111/73  Pulse: (!) 121 (!) 106 96 (!) 102  Resp: '19 18 16 18  '$ Temp: 98.6 F (37 C) 99.1 F (37.3 C) 98.2 F (36.8 C) 97.9 F (36.6 C)  TempSrc: Oral Oral Oral Oral  SpO2: 99% 99% 95% 100%  Weight:      Height:        Intake/Output Summary (Last 24 hours) at 08/19/2022 1249 Last data filed at 08/19/2022 0948 Gross per 24 hour  Intake 382.25 ml  Output 440 ml  Net -57.75 ml   Filed Weights   08/12/22 0500 08/15/22 0500 08/18/22 0500  Weight: 59.5 kg 59.2 kg 68.9 kg    Examination:  General exam: Overall comfortable, not in distress, very weak, deconditioned, chronically looking HEENT: PERRL Respiratory system: Diminished sounds bilaterally, right-sided chest tube Cardiovascular system: S1 & S2 heard, RRR.  Gastrointestinal system: Abdomen is nondistended, soft and nontender. Central nervous system: Alert and oriented Extremities: No edema, no clubbing ,no cyanosis Skin: Right breast cancer with overlying ulcer covered with dressing   Data Reviewed: I have personally reviewed following labs and imaging studies  CBC: Recent Labs  Lab 08/13/22 2350 08/16/22 0033 08/19/22 0450  WBC 16.0* 13.5* 13.3*  HGB 9.0* 9.2* 8.9*  HCT 28.3* 29.7* 28.7*  MCV 83.0 83.7 86.2  PLT 517* 518* 086*   Basic Metabolic Panel: Recent Labs  Lab 08/12/22 2034 08/13/22 1228 08/15/22 1439 08/17/22 1305  NA  --  140 141 142  K  --  3.9 4.0 3.9  CL  --  108 109 109  CO2  --  '27 29 24  '$ GLUCOSE  --  179* 184* 107*  BUN  --  19 24* 22  CREATININE  --  0.65 0.67 0.61  CALCIUM  --  6.6* 7.0* 7.4*  MG 2.5*  --   --   --   PHOS 1.9*  --   --   --       Recent Results (from the past 240 hour(s))  Body fluid culture w Gram Stain     Status: None   Collection Time: 08/12/22  2:44 PM   Specimen: Pleura; Body Fluid  Result Value Ref Range Status   Specimen Description   Final    PLEURAL RIGHT Performed at [facility], [address] [place], [place] [address]    Special Requests   Final    Immunocompromised Performed at [facility], [place] [address], [place], [place] [address]    Gram Stain   Final    FEW WBC PRESENT, PREDOMINANTLY MONONUCLEAR NO ORGANISMS SEEN    Culture   Final    NO GROWTH 3 DAYS Performed at [facility] [address], [place], [place] [address]    Report Status 08/15/2022 FINAL  Final     Radiology Studies: DG CHEST PORT 1 VIEW  Result Date: 08/18/2022 CLINICAL DATA:  Pleural effusion. EXAM: PORTABLE CHEST 1 VIEW COMPARISON:  Chest radiograph 08/17/2022 FINDINGS: Right basilar chest tube is stable in position. Patient is rotated towards the right which limits evaluation of this examination. Patient had a small right apical pneumothorax on the previous examination which is probably unchanged but poorly characterized. Increased densities near the lateral right lung base could be related to overlying shadows but cannot exclude re-accumulation of pleural fluid. Persistent small to moderate sized left pleural effusion. Patchy parenchymal densities in both lungs compatible with atelectasis and difficult to exclude mild edema. Heart size is stable. IMPRESSION: 1. Limited examination due to patient rotation. 2. Right chest tube is stable in position. Patient had a small right apical pneumothorax on the previous examination which is probably unchanged but poorly characterized. 3. Increased densities near the lateral right lung base could be related to overlying shadows but cannot exclude re-accumulation of pleural fluid. 4. Persistent small to moderate sized left pleural effusion.  Electronically Signed   By: Markus Daft M.D.   On: 08/18/2022 08:19   DG CHEST PORT 1 VIEW  Result Date: 08/17/2022 CLINICAL DATA:  Follow-up pleural effusions. EXAM: PORTABLE CHEST 1 VIEW COMPARISON:  08/15/2022 FINDINGS: Right pleural pigtail catheter remains in place. Minimal residual pleural fluid  is noted, as well as a 5% right apical pneumothorax. Small left pleural effusion and left basilar atelectasis shows no significant change. Cardiomegaly remains stable. IMPRESSION: Minimal residual right pleural fluid and 5% right apical pneumothorax. Right pleural pigtail catheter remains in place. Stable small left pleural effusion and left basilar atelectasis. Electronically Signed   By: Marlaine Hind M.D.   On: 08/17/2022 14:12    Scheduled Meds:  anastrozole  1 mg Oral Daily   bisacodyl  10 mg Rectal Once   calcium carbonate  1 tablet Oral BID   enoxaparin (LOVENOX) injection  40 mg Subcutaneous Q24H   famotidine  20 mg Oral Daily   feeding supplement  1 Container Oral QID   megestrol  40 mg Oral BID   mirtazapine  7.5 mg Oral QHS   multivitamin with minerals  1 tablet Oral Daily   polyethylene glycol  17 g Oral Daily   senna  2 tablet Oral BID   sodium chloride flush  10 mL Intrapleural Q8H   sodium chloride flush  3 mL Intravenous Q12H   Continuous Infusions:  sodium chloride     sodium chloride Stopped (08/18/22 1318)    ceFAZolin (ANCEF) IV 2 g (08/19/22 1141)     LOS: 15 days   Shelly Coss, MD [facility] P11/21/2023, 12:49 PM

## 2022-08-19 NOTE — Progress Notes (Signed)
Yardley Beltran   DOB:11-Aug-1961   KD#:[id number]    ASSESSMENT & PLAN:  Metastatic breast cancer to the lung and bones While she responded very well to chemotherapy and antiestrogen therapy, she is not thriving due to progressive weight loss and weakness While hospitalized, I recommend we continue on antiestrogen therapy but hold abemaciclib Recent CT imaging showed continuous improvement in response to therapy despite being off abemaciclib She will continue anastrozole   Recurrent malignant pleural effusion with MSSA empyema I would defer management to pulmonologist/ICU team She would likely be on several weeks of antibiotics I reviewed plan of care with pulmonologist yesterday   malignant cachexia Failure to thrive Severe protein calorie malnutrition I encouraged the patient to continue frequent small meals Reviewed documentation from dietitian.  She had average approximately 50% of caloric requirement   Cancer associated pain She had recent confusion episode/altered mental status due to narcotics Her pain control has improved when her pain medicine is on a scheduled basis I do not recommend schedule long acting narcotics due to her frail status and propensity to get confused   Generalized weakness Failure to thrive Multifactorial, related to progressive weight loss, muscle loss and deconditioning Recommend skilled nursing facility placement upon discharge However, this is too premature to arrange.  Her mother expressed understanding   Code Status With a progressive decline in empyema, I reviewed the CODE STATUS with the patient twice on November 9 Ultimately, she agreed with changing her CODE STATUS to DO NOT RESUSCITATE I discussed this with Meleni's mother today  Goals of care Improvement of weakness, infection and nutrition status   Discharge planning Unknown at this point I updated her mother  I told her I am not contributing much to her care over the past few days I  will see her once a week starting next week Please call if questions arise  All questions were answered. The patient knows to call the clinic with any problems, questions or concerns.   The total time spent in the appointment was 40 minutes encounter with patients including review of chart and various tests results, discussions about plan of care and coordination of care plan  Heath Lark, MD 08/19/2022 9:43 AM  Subjective:  She is seen this morning.  I have reviewed documentation over the past few days.  Discussed plan for management of empyema/recurrent pleural effusion with the pulmonologist yesterday She felt that her pain could be better controlled The patient appears optimistic She felt that she is "eating a lot of food" She denies constipation  Objective:  Vitals:   08/19/22 0005 08/19/22 0453  BP: 103/71 111/73  Pulse: 96 (!) 102  Resp: 16 18  Temp: 98.2 F (36.8 C) 97.9 F (36.6 C)  SpO2: 95% 100%     Intake/Output Summary (Last 24 hours) at 08/19/2022 0943 Last data filed at 08/18/2022 1755 Gross per 24 hour  Intake 932.25 ml  Output 440 ml  Net 492.25 ml    GENERAL:alert, no distress and comfortable.  She appears thin and cachectic.  Chest tube by the bedside NEURO: alert & oriented x 3 with fluent speech, no focal motor/sensory deficits   Labs:  Recent Labs    08/05/22 0458 08/06/22 0456 08/06/22 1549 08/07/22 0428 08/08/22 0500 08/09/22 1113 08/13/22 1228 08/15/22 1439 08/17/22 1305  NA 136 136  --  137 138   < > 140 141 142  K 4.0 4.2  --  4.2 4.2   < > 3.9 4.0 3.9  CL  103 102  --  105 104   < > 108 109 109  CO2 25 28  --  28 29   < > '27 29 24  '$ GLUCOSE 129* 124*  --  135* 135*   < > 179* 184* 107*  BUN 33* 35*  --  30* 25*   < > 19 24* 22  CREATININE 1.85* 1.38*  --  1.08* 0.80   < > 0.65 0.67 0.61  CALCIUM 7.1* 8.0*  --  7.7* 7.8*   < > 6.6* 7.0* 7.4*  GFRNONAA 30* 43*  --  58* >60   < > >60 >60 >60  PROT 6.3* 6.0* 6.1* 6.0*  --   --   --    --   --   ALBUMIN 2.4* 2.3*  --  2.2* 2.1*  --   --   --   --   AST 22 18  --  20  --   --   --   --   --   ALT 23 21  --  20  --   --   --   --   --   ALKPHOS 50 49  --  49  --   --   --   --   --   BILITOT 0.3 0.4  --  0.3  --   --   --   --   --    < > = values in this interval not displayed.    Studies: I have reviewed her CXR DG CHEST PORT 1 VIEW  Result Date: 08/18/2022 CLINICAL DATA:  Pleural effusion. EXAM: PORTABLE CHEST 1 VIEW COMPARISON:  Chest radiograph 08/17/2022 FINDINGS: Right basilar chest tube is stable in position. Patient is rotated towards the right which limits evaluation of this examination. Patient had a small right apical pneumothorax on the previous examination which is probably unchanged but poorly characterized. Increased densities near the lateral right lung base could be related to overlying shadows but cannot exclude re-accumulation of pleural fluid. Persistent small to moderate sized left pleural effusion. Patchy parenchymal densities in both lungs compatible with atelectasis and difficult to exclude mild edema. Heart size is stable. IMPRESSION: 1. Limited examination due to patient rotation. 2. Right chest tube is stable in position. Patient had a small right apical pneumothorax on the previous examination which is probably unchanged but poorly characterized. 3. Increased densities near the lateral right lung base could be related to overlying shadows but cannot exclude re-accumulation of pleural fluid. 4. Persistent small to moderate sized left pleural effusion. Electronically Signed   By: Markus Daft M.D.   On: 08/18/2022 08:19   DG CHEST PORT 1 VIEW  Result Date: 08/17/2022 CLINICAL DATA:  Follow-up pleural effusions. EXAM: PORTABLE CHEST 1 VIEW COMPARISON:  08/15/2022 FINDINGS: Right pleural pigtail catheter remains in place. Minimal residual pleural fluid is noted, as well as a 5% right apical pneumothorax. Small left pleural effusion and left basilar  atelectasis shows no significant change. Cardiomegaly remains stable. IMPRESSION: Minimal residual right pleural fluid and 5% right apical pneumothorax. Right pleural pigtail catheter remains in place. Stable small left pleural effusion and left basilar atelectasis. Electronically Signed   By: Marlaine Hind M.D.   On: 08/17/2022 14:12   VAS [place] LOWER EXTREMITY VENOUS (DVT)  Result Date: 08/15/2022  Lower Venous DVT Study Patient Name:  NAJMO PARDUE  Date of Exam:   08/14/2022 Medical Rec #: [id number]        Accession #:  [id number] Date of Birth: 23-Sep-1961         Patient Gender: F Patient Age:   61 years Exam Location:  [facility] Procedure:      VAS [place] LOWER EXTREMITY VENOUS (DVT) Referring Phys: Noemi Chapel --------------------------------------------------------------------------------  Indications: Edema.  Risk Factors: None identified. Limitations: Poor ultrasound/tissue interface. Comparison Study: No prior studies. Performing Technologist: Oliver Hum RVT  Examination Guidelines: A complete evaluation includes B-mode imaging, spectral Doppler, color Doppler, and power Doppler as needed of all accessible portions of each vessel. Bilateral testing is considered an integral part of a complete examination. Limited examinations for reoccurring indications may be performed as noted. The reflux portion of the exam is performed with the patient in reverse Trendelenburg.  +---------+---------------+---------+-----------+----------+--------------+ RIGHT    CompressibilityPhasicitySpontaneityPropertiesThrombus Aging +---------+---------------+---------+-----------+----------+--------------+ CFV      Full           Yes      Yes                                 +---------+---------------+---------+-----------+----------+--------------+ SFJ      Full                                                        +---------+---------------+---------+-----------+----------+--------------+  FV Prox  Full                                                        +---------+---------------+---------+-----------+----------+--------------+ FV Mid   Full                                                        +---------+---------------+---------+-----------+----------+--------------+ FV DistalFull                                                        +---------+---------------+---------+-----------+----------+--------------+ PFV      Full                                                        +---------+---------------+---------+-----------+----------+--------------+ POP      Full           Yes      Yes                                 +---------+---------------+---------+-----------+----------+--------------+ PTV      Full                                                        +---------+---------------+---------+-----------+----------+--------------+  PERO     Full                                                        +---------+---------------+---------+-----------+----------+--------------+   +---------+---------------+---------+-----------+----------+--------------+ LEFT     CompressibilityPhasicitySpontaneityPropertiesThrombus Aging +---------+---------------+---------+-----------+----------+--------------+ CFV      Full           Yes      Yes                                 +---------+---------------+---------+-----------+----------+--------------+ SFJ      Full                                                        +---------+---------------+---------+-----------+----------+--------------+ FV Prox  Full                                                        +---------+---------------+---------+-----------+----------+--------------+ FV Mid   Full                                                        +---------+---------------+---------+-----------+----------+--------------+ FV DistalFull                                                         +---------+---------------+---------+-----------+----------+--------------+ PFV      Full                                                        +---------+---------------+---------+-----------+----------+--------------+ POP      Full           Yes      Yes                                 +---------+---------------+---------+-----------+----------+--------------+ PTV      Full                                                        +---------+---------------+---------+-----------+----------+--------------+ PERO     Full                                                        +---------+---------------+---------+-----------+----------+--------------+  Summary: RIGHT: - There is no evidence of deep vein thrombosis in the lower extremity.  - No cystic structure found in the popliteal fossa.  LEFT: - There is no evidence of deep vein thrombosis in the lower extremity.  - No cystic structure found in the popliteal fossa.  *See table(s) above for measurements and observations. Electronically signed by Deitra Mayo MD on 08/15/2022 at 3:02:04 PM.    Final    DG CHEST PORT 1 VIEW  Result Date: 08/15/2022 CLINICAL DATA:  142230 Pleural effusion 142230 EXAM: PORTABLE CHEST 1 VIEW COMPARISON:  August 14, 2022 FINDINGS: The cardiomediastinal silhouette is unchanged in contour.RIGHT-sided chest tube. RIGHT-sided hydropneumothorax with a mildly decreased fluid component in comparison to prior thereby mildly increased conspicuity of the air component. Moderate LEFT pleural effusion. Bibasilar opacities, similar in comparison to prior. Revisualization of a peripheral RIGHT rind of airspace opacities. IMPRESSION: 1. RIGHT-sided hydropneumothorax with mild decrease in the fluid component and increased conspicuity of the air component. There is likely a corresponding corticated RIGHT lung. 2. Moderate LEFT pleural effusion, similar in comparison to prior.  Electronically Signed   By: Valentino Saxon M.D.   On: 08/15/2022 09:52   DG CHEST PORT 1 VIEW  Result Date: 08/14/2022 CLINICAL DATA:  Right chest tube, bilateral pleural effusions EXAM: PORTABLE CHEST 1 VIEW COMPARISON:  Previous studies including the examination of 08/13/2022 FINDINGS: Transverse diameter of heart is increased. Right chest tube is noted with its tip in the medial right lower lung field. Moderate bilateral pleural effusions are seen, more so on the left side. There is interval decrease in right pleural effusion. Sharp appearance of right cardiac margin may suggest loculated pneumothorax in the right lower lung field with interval decrease. There is no demonstrable apical pneumothorax. IMPRESSION: Cardiomegaly. Bilateral pleural effusions, more so on the left side. There is interval decrease in right pleural effusion and possible decrease in loculated pneumothorax in right lower lung field. There is no demonstrable apical pneumothorax. Electronically Signed   By: Elmer Picker M.D.   On: 08/14/2022 08:21   DG CHEST PORT 1 VIEW  Result Date: 08/13/2022 CLINICAL DATA:  Pleural effusion EXAM: PORTABLE CHEST 1 VIEW COMPARISON:  CXR 08/12/22 FINDINGS: Right-sided pigtail pleural drainage catheter in place, unchanged in appearance from prior exam. Redemonstrated is a large right-sided hydropneumothorax which overall appears similar to prior exam with unchanged appearance of the air component along the apical and lateral margins of the right lung and the fluid component layering at the right lung base. The appearance of the left lung is also unchanged from prior exam with a persistent small layering pleural effusion. There is no new focal airspace opacity. Cardiac and mediastinal contours are unchanged from prior exam. No new displaced rib fracture. Visualized upper abdomen is unremarkable. IMPRESSION: 1. No significant change in appearance of the large right-sided hydropneumothorax  with a pigtail pleural drainage catheter in place. 2.  Unchanged layering moderate sized left pleural effusion. Electronically Signed   By: Marin Roberts M.D.   On: 08/13/2022 07:21   DG CHEST PORT 1 VIEW  Result Date: 08/12/2022 CLINICAL DATA:  [id number] with right hydropneumothorax, left pleural effusion. EXAM: PORTABLE CHEST 1 VIEW COMPARISON:  Portable chest yesterday at 7:28 a.m. FINDINGS: 4:29 a.m. Pigtail chest tube with tip in the medial right chest base is similar in positioning. Moderate to large bilateral pleural effusions, on the left layering posteriorly without pneumothorax, on the right again with loculated hydropneumothorax and extrapleural air again noted laterally.  The right hydropneumothorax is estimated to occupy much as 50% of the right chest volume with the hydro component predominating. There is overlying consolidation or atelectasis in the lower lung fields. The overall aeration pattern seems unchanged. There is cardiomegaly with mild central vascular prominence. No overt edema is seen. Stable mediastinum. Again noted is a destructive lesion of the lateral right fifth rib likely metastasis. The patient is rotated to the right. IMPRESSION: 1. No significant change in the appearance of the chest since yesterday's study. 2. The right hydropneumothorax is estimated to occupy much as 50% of the right chest volume with the hydro component predominating. 3. There is overlying consolidation or atelectasis in the lower lung fields with moderate-to-large layering left effusion. 4. Cardiomegaly with mild central vascular prominence. Electronically Signed   By: Telford Nab M.D.   On: 08/12/2022 06:03   DG Chest Port 1 View  Result Date: 08/11/2022 CLINICAL DATA:  Pleural effusion, metastatic breast carcinoma EXAM: PORTABLE CHEST 1 VIEW COMPARISON:  Previous studies including the examination of 08/10/2022 FINDINGS: Apparent shift of mediastinum to the right may be due to rotation. Transverse  diameter of heart is increased. Moderate to large bilateral pleural effusions are seen. There is interval decrease in right pleural effusion. Single right chest tube is seen. Central pulmonary vessels are less prominent. Evaluation of right mid and both lower lung fields for infiltrates is limited by the effusions. There is no pneumothorax. IMPRESSION: Moderate to large bilateral pleural effusions. There is interval decrease in right pleural effusion. Electronically Signed   By: Elmer Picker M.D.   On: 08/11/2022 09:29   CT CHEST WO CONTRAST  Result Date: 08/10/2022 CLINICAL DATA:  Metastatic breast cancer.  Empyema. * Tracking Code: BO * EXAM: CT CHEST WITHOUT CONTRAST TECHNIQUE: Multidetector CT imaging of the chest was performed following the standard protocol without IV contrast. RADIATION DOSE REDUCTION: This exam was performed according to the departmental dose-optimization program which includes automated exposure control, adjustment of the mA and/or kV according to patient size and/or use of iterative reconstruction technique. COMPARISON:  06/25/2022 FINDINGS: Cardiovascular: Mild atherosclerotic calcification of the aortic arch. Mediastinum/Nodes: There is hazy diffuse edema/infiltration of the mediastinal and subcutaneous adipose tissues compatible with third spacing of fluid. Lungs/Pleura: Large loculated right hydropneumothorax. Pigtail right pleural drainage catheter in place. IDA. Made only about 25% of the right lung is aerated. There is scattered atelectasis in the right lung. Moderate to large left pleural effusion encompassing about 1/3 of the left hemithoracic volume. No definite mass or nodule in the aerated portion of the left lung. Upper Abdomen: Stable small hypodense hepatic lesions, compatible with cysts. Stable benign cyst in the left kidney upper pole, not requiring further imaging workup. Musculoskeletal: The CT scan from 06/25/2022 showed enhancing lesions in the right chest  wall and axilla. These are substantially less conspicuous today although likely mainly from lack of IV contrast rather than resolution. A right axillary mass or lymph node measures 1.8 cm in short axis on image 52 series 2, formerly 1.9 cm. Bony destructive findings of the right fifth rib laterally as on image 62 series 5. Sclerotic focus possibly a healing fracture in the right eighth rib laterally. Extensive sclerosis in the sternum favoring metastatic disease over infection. Nearly complete central collapse of the T3 vertebral body with associated sclerosis and minimal posterior bony retropulsion unchanged from prior. IMPRESSION: 1. Large loculated right hydropneumothorax encompassing about 75% of right hemithoracic volume. Pigtail right pleural drainage catheter in place. 2.  Moderate to large left pleural effusion encompassing about 1/3 of the left hemithoracic volume. 3. Right axillary mass or lymph node measures 1.8 cm in short axis, formerly 1.9 cm. Other previously seen chest wall soft tissue lesions are less conspicuous on today's exam due to lack of IV contrast, but likely still present. 4. Bony destructive findings of the right fifth rib laterally compatible with metastatic disease. 5. Extensive sclerosis in the sternum favoring metastatic disease over infection. 6. Nearly complete central collapse of the T3 vertebral body with associated sclerosis and minimal posterior bony retropulsion, unchanged from prior. 7. Diffuse hazy edema/infiltration of the mediastinal and subcutaneous adipose tissues compatible with third spacing of fluid. 8. Aortic atherosclerosis. Aortic Atherosclerosis (ICD10-I70.0). Electronically Signed   By: Van Clines M.D.   On: 08/10/2022 10:39   DG Chest Port 1 View  Result Date: 08/10/2022 CLINICAL DATA:  Pleural effusion. Follow-up. Acute respiratory failure. EXAM: PORTABLE CHEST 1 VIEW COMPARISON:  08/08/2022 FINDINGS: There is a right-sided pigtail thoracostomy tube  overlying the medial right lower lung. Right-sided, locule hydropneumothorax is identified. No significant change in volume compared with the previous exam. Moderate left pleural effusion appears increased in volume. IMPRESSION: 1. No significant change in volume of right-sided hydropneumothorax. 2. Increase in volume of moderate left pleural effusion. 3. Stable position of right thoracostomy tube. Electronically Signed   By: Kerby Moors M.D.   On: 08/10/2022 09:07   DG Chest Port 1 View  Result Date: 08/09/2022 CLINICAL DATA:  Acute respiratory failure, right pleural effusion EXAM: PORTABLE CHEST 1 VIEW COMPARISON:  08/08/2022 FINDINGS: Interval placement of right basilar chest tube. Decreasing right pleural effusion. Moderate right effusion remains. No pneumothorax. Extensive airspace disease throughout the right lung. Left perihilar and lower lobe airspace disease with layering left effusion. Cardiomegaly. No acute bony abnormality. IMPRESSION: Interval placement of right chest tube with decreasing right pleural effusion and slight improved aeration within the right lung. Continued diffuse airspace disease on the right and in the left lower lung. Bilateral effusions. No pneumothorax. Electronically Signed   By: Rolm Baptise M.D.   On: 08/09/2022 08:12   CT PERC PLEURAL DRAIN W/INDWELL CATH W/IMG GUIDE  Result Date: 08/08/2022 INDICATION: 61 year old woman with right empyema presents to IR for chest tube placement EXAM: CT-guided right chest tube placement TECHNIQUE: Multidetector CT imaging of the chest was performed following the standard protocol without IV contrast. RADIATION DOSE REDUCTION: This exam was performed according to the departmental dose-optimization program which includes automated exposure control, adjustment of the mA and/or kV according to patient size and/or use of iterative reconstruction technique. MEDICATIONS: The patient is currently admitted to the hospital and receiving  intravenous antibiotics. The antibiotics were administered within an appropriate time frame prior to the initiation of the procedure. ANESTHESIA/SEDATION: Moderate (conscious) sedation was employed during this procedure. A total of Versed 2 mg and Fentanyl 100 mcg was administered intravenously by the radiology nurse. Total intra-service moderate Sedation Time: 14 minutes. The patient's level of consciousness and vital signs were monitored continuously by radiology nursing throughout the procedure under my direct supervision. COMPLICATIONS: None immediate. PROCEDURE: Informed written consent was obtained from the patient after a thorough discussion of the procedural risks, benefits and alternatives. All questions were addressed. Maximal Sterile Barrier Technique was utilized including caps, mask, sterile gowns, sterile gloves, sterile drape, hand hygiene and skin antiseptic. A timeout was performed prior to the initiation of the procedure. Patient positioned left lateral decubitus on the procedure table. The right lateral  chest wall skin prepped and draped in usual fashion. Following local lidocaine administration, right pleural space was accessed with 19 gauge Yueh needle utilizing CT guidance. Yueh catheter removed over 0.035 inch Amplatz guidewire, serial dilation performed, and 14 [place] multipurpose pigtail drain inserted. CT confirmed appropriate positioning of the drain within the right pleural space. The drain was connected to Pleur-Evac and secured to skin with suture. IMPRESSION: CT-guided right chest tube (14 [place]) as above. Electronically Signed   By: Miachel Roux M.D.   On: 08/08/2022 17:02   DG Chest Port 1 View  Result Date: 08/08/2022 CLINICAL DATA:  Follow-up pleural effusion. EXAM: PORTABLE CHEST 1 VIEW COMPARISON:  08/07/2022 FINDINGS: Exam detail is diminished due to patient positioning with significant rightward rotational artifact. The cardiomediastinal contours appear unchanged. There  is a large right pleural effusion which is unchanged in volume compared with the previous exam. There is significantly diminished aeration to the right lung with only a small amount of aerated lung in the right upper lobe. Small left pleural effusion is unchanged. IMPRESSION: 1. No change in large right pleural effusion with significantly diminished aeration to the right lung. Electronically Signed   By: Kerby Moors M.D.   On: 08/08/2022 07:05   DG Chest Port 1 View  Result Date: 08/07/2022 CLINICAL DATA:  Pleural effusion EXAM: PORTABLE CHEST 1 VIEW COMPARISON:  08/06/2022 FINDINGS: No significant change in rotated AP portable examination. Large right pleural effusion with near total atelectasis or consolidation of the right lung, as well as a small, layering left pleural effusion. Cardiomegaly, the cardiac borders largely obscured. IMPRESSION: No significant change in rotated AP portable examination. Large right pleural effusion with near total atelectasis or consolidation of the right lung, as well as a small, layering left pleural effusion. Electronically Signed   By: Delanna Ahmadi M.D.   On: 08/07/2022 09:17   DG Chest Port 1 View  Result Date: 08/06/2022 CLINICAL DATA:  S/P thoracentesis EXAM: PORTABLE CHEST 1 VIEW COMPARISON:  Same day chest x-ray. FINDINGS: Decreased but still large right pleural effusion. Small left pleural effusion. Bilateral overlying opacities. No visible pneumothorax on this semi erect radiograph. Cardiomediastinal silhouette is similar. Polyarticular degenerative change. IMPRESSION: 1. Decreased but still large right pleural effusion. No visible pneumothorax on this semi erect radiograph. 2. Small left pleural effusion 3. Bilateral overlying opacities. Electronically Signed   By: Margaretha Sheffield M.D.   On: 08/06/2022 15:50   DG Chest 1 View  Result Date: 08/06/2022 CLINICAL DATA:  Respiratory failure. EXAM: CHEST  1 VIEW COMPARISON:  Multiple recent chest x-rays.  FINDINGS: Persistent complete opacification of the right hemithorax due to a large pleural effusion. Slight shift of the heart and mediastinum to the left exaggerated by the rotation of the patient and thoracic scoliosis. Persistent left lower lobe atelectasis but no left pleural effusion. IMPRESSION: Persistent complete opacification of the right hemithorax. Electronically Signed   By: Marijo Sanes M.D.   On: 08/06/2022 11:48   DG Chest Port 1 View  Result Date: 08/05/2022 CLINICAL DATA:  Shortness of breath EXAM: PORTABLE CHEST 1 VIEW COMPARISON:  08/05/2022, 06/25/2022, 08/03/2022 FINDINGS: Complete opacification of right thorax without change. Subsegmental atelectasis left base. Partially obscured cardiomediastinal silhouette. IMPRESSION: Complete opacification of the right thorax without change, probably due to sizable pleural effusion. Subsegmental atelectasis left base Electronically Signed   By: Donavan Foil M.D.   On: 08/05/2022 23:33   ECHOCARDIOGRAM COMPLETE  Result Date: 08/05/2022    ECHOCARDIOGRAM REPORT  Patient Name:   SHONTELLE MUSKA Date of Exam: 08/05/2022 Medical Rec #:  [id number]       Height:       66.0 in Accession #:    [id number]      Weight:       118.6 lb Date of Birth:  1961-06-26        BSA:          1.602 m Patient Age:    66 years        BP:           97/44 mmHg Patient Gender: F               HR:           78 bpm. Exam Location:  Inpatient Procedure: 2D Echo, Cardiac Doppler and Color Doppler Indications:    R94.31 Abnormal EKG  History:        Patient has no prior history of Echocardiogram examinations.                 Risk Factors:Hypertension. Metastatic breast cancer.  Sonographer:    [place] Referring Phys: [id number] AMRIT ADHIKARI  Sonographer Comments: Technically difficult study due to poor echo windows, suboptimal apical window and suboptimal subcostal window. Apical images off axis due to the hardened texture of the left breast. Could not turn patient. Wound  dressings in subcostal region. IMPRESSIONS  1. Left ventricular ejection fraction, by estimation, is 55 to 60%. The left ventricle has normal function. The left ventricle has no regional wall motion abnormalities. Left ventricular diastolic parameters were normal.  2. Right ventricular systolic function is normal. The right ventricular size is normal. Mildly increased right ventricular wall thickness. There is mildly elevated pulmonary artery systolic pressure. The estimated right ventricular systolic pressure is 21.1 mmHg.  3. Left atrial size was mildly dilated.  4. A small pericardial effusion is present. The pericardial effusion is circumferential. There is no evidence of cardiac tamponade. Large pleural effusion in the right lateral region.  5. 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 Tricuspid valve regurgitation is mild to moderate.  7. The aortic valve is tricuspid. Aortic valve regurgitation is not visualized. No aortic stenosis is present.  8. The inferior vena cava is normal in size with greater than 50% respiratory variability, suggesting right atrial pressure of 3 mmHg. FINDINGS  Left Ventricle: Left ventricular ejection fraction, by estimation, is 55 to 60%. The left ventricle has normal function. The left ventricle has no regional wall motion abnormalities. The left ventricular internal cavity size was normal in size. There is  no left ventricular hypertrophy. Left ventricular diastolic parameters were normal. Right Ventricle: The right ventricular size is normal. Mildly increased right ventricular wall thickness. Right ventricular systolic function is normal. There is mildly elevated pulmonary artery systolic pressure. The tricuspid regurgitant velocity is 3.19 m/s, and with an assumed right atrial pressure of 3 mmHg, the estimated right ventricular systolic  pressure is 94.1 mmHg. Left Atrium: Left atrial size was mildly dilated. Right Atrium: Right atrial size was normal in size. Pericardium: Complex septated fluid collection in the right pleural cavity, consider empyema or malignant effusion. A small pericardial effusion is present. The pericardial effusion is circumferential. There is no evidence of cardiac tamponade. 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 There is mild thickening of the mitral valve leaflet(s). Mild mitral valve regurgitation, with centrally-directed jet. Tricuspid Valve: The tricuspid valve is myxomatous. Tricuspid valve regurgitation is mild to moderate. Aortic Valve: The aortic valve is tricuspid. Aortic valve regurgitation is not visualized. No aortic stenosis is present. Pulmonic Valve: The pulmonic valve was normal in structure. Pulmonic valve regurgitation is mild. Aorta: The aortic root and ascending aorta are structurally normal, with no evidence of dilitation. Venous: IVC assessment for right atrial pressure unable to be performed due to mechanical ventilation. The inferior vena cava is normal in size with greater than 50% respiratory variability, suggesting right atrial pressure of 3 mmHg. IAS/Shunts: No atrial level shunt detected by color flow Doppler. Additional Comments: There is a large pleural effusion in the right lateral region.  LEFT VENTRICLE PLAX 2D LVIDd:         4.43 cm     Diastology LVIDs:         2.80 cm     LV e' medial:    5.08 cm/s LV PW:         1.03 cm     LV E/e' medial:  10.3 LV IVS:        0.87 cm     LV e' lateral:   6.46 cm/s LVOT diam:     2.10 cm     LV E/e' lateral: 8.1 LV SV:         44 LV SV Index:   28 LVOT Area:     3.46 cm  LV Volumes (MOD) LV vol d, MOD A4C: 57.5 ml LV vol s, MOD A4C: 18.7 ml LV SV MOD A4C:     57.5 ml RIGHT VENTRICLE             IVC RV S prime:     13.10 cm/s  IVC diam: 1.10 cm TAPSE (M-mode): 1.7 cm LEFT  ATRIUM           Index        RIGHT ATRIUM           Index LA diam:      3.20 cm 2.00 cm/m   RA Area:     14.20 cm LA Vol (A4C): 57.5 ml 35.90 ml/m  RA Volume:   35.90 ml  22.42 ml/m  AORTIC VALVE             PULMONIC VALVE LVOT Vmax:   75.15 cm/s  PR End Diast Vel: 2.14 msec LVOT Vmean:  45.850 cm/s LVOT VTI:    0.127 m  AORTA Ao Root diam: 2.60 cm Ao Asc diam:  2.70 cm MITRAL VALVE               TRICUSPID VALVE MV Area (PHT): 4.89 cm    TR Peak grad:   40.7 mmHg MV Decel Time: 155 msec    TR Vmax:        319.00 cm/s MV E velocity: 52.40 cm/s MV A velocity: 50.00 cm/s  SHUNTS MV E/A ratio:  1.05        Systemic VTI:  0.13 m                            Systemic Diam: 2.10 cm Dani Gobble Croitoru MD Electronically signed by Sanda Klein MD Signature Date/Time: 08/05/2022/12:38:14 PM    Final    DG CHEST PORT 1 VIEW  Result Date: 08/05/2022 CLINICAL DATA:  Right pleural effusion. EXAM: PORTABLE CHEST 1 VIEW COMPARISON:  08/05/2022 FINDINGS: Stable cardiomediastinal contours. Left lung is clear. Complete opacification of the right hemithorax is again noted and appears unchanged. Findings are favored to represent known large right pleural effusion. IMPRESSION: 1. No change in complete opacification of the right hemithorax which is presumed to represent large right pleural effusion. Electronically Signed   By: Kerby Moors M.D.   On: 08/05/2022 10:06   DG CHEST PORT 1 VIEW  Result Date: 08/05/2022 CLINICAL DATA:  Respiratory compromise EXAM: PORTABLE CHEST 1 VIEW COMPARISON:  08/03/2022 FINDINGS: Complete opacification of the right hemithorax, presumably reflecting a large pleural effusion. Left lung is clear. No pneumothorax. The heart is normal in size. IMPRESSION: Complete opacification of the right hemithorax, presumably reflecting a large pleural effusion. Electronically Signed   By: Julian Hy M.D.   On: 08/05/2022 00:41   US THORACENTESIS ASP PLEURAL SPACE W/IMG GUIDE  Result Date:  08/03/2022 INDICATION: Patient with history of right-sided breast cancer, right pleural effusion. Request is made for diagnostic and therapeutic thoracentesis. EXAM: ULTRASOUND GUIDED RIGHT THORACENTESIS MEDICATIONS: 10 mL 1% lidocaine COMPLICATIONS: None immediate. PROCEDURE: An ultrasound guided thoracentesis was thoroughly discussed with the patient and questions answered. The benefits, risks, alternatives and complications were also discussed. The patient understands and wishes to proceed with the procedure. Written consent was obtained. Ultrasound was performed to localize and mark an adequate pocket of fluid in the right chest. The area was then prepped and draped in the normal sterile fashion. 1% Lidocaine was used for local anesthesia. Under ultrasound guidance a 6 Fr Safe-T-Centesis catheter was introduced. Thoracentesis was performed. The catheter was removed and a dressing applied. FINDINGS: A total of approximately 800 mL of yellow fluid was removed. Samples were sent to the laboratory as requested by the clinical team. IMPRESSION: Successful ultrasound guided right thoracentesis yielding 800 mL of pleural fluid. Read by: Brynda Greathouse PA-C Electronically Signed   By: Sandi Mariscal M.D.   On: 08/03/2022 13:06   DG Chest Port 1 View  Result Date: 08/03/2022 CLINICAL DATA:  Post right thoracentesis EXAM: PORTABLE CHEST 1 VIEW COMPARISON:  08/02/2022 FINDINGS: Continued complete opacification of the right hemithorax. No visible pneumothorax. No confluent opacity or effusion on the left. Heart is normal size. IMPRESSION: Continued complete opacification of the right hemithorax. No visible pneumothorax. Electronically Signed   By: Rolm Baptise M.D.   On: 08/03/2022 11:52   DG Chest Portable 1 View  Result Date: 08/02/2022 CLINICAL DATA:  Weakness and breast cancer.  Dehydration.  Fatigue. EXAM: PORTABLE CHEST 1 VIEW COMPARISON:  Chest CT 06/25/2022 FINDINGS: Increased right pleural effusion with  complete opacification of the right hemithorax. There is leftward shift of the trachea and mediastinal structures. No aerated right lung is seen. No focal airspace disease in the left lung. Left lung nodules on prior CT are not seen by radiograph. No visible pneumothorax. Osseous metastatic disease on CT not well demonstrated. IMPRESSION: Increased right pleural effusion with complete opacification of the right hemithorax. No aerated right lung is seen. Leftward shift of the trachea and mediastinal structures. Electronically Signed   By: Keith Rake M.D.   On: 08/02/2022 16:53

## 2022-08-20 ENCOUNTER — Inpatient Hospital Stay (HOSPITAL_COMMUNITY): Payer: BC Managed Care – PPO

## 2022-08-20 DIAGNOSIS — J869 Pyothorax without fistula: Secondary | ICD-10-CM | POA: Diagnosis not present

## 2022-08-20 DIAGNOSIS — J90 Pleural effusion, not elsewhere classified: Secondary | ICD-10-CM | POA: Diagnosis not present

## 2022-08-20 DIAGNOSIS — C50911 Malignant neoplasm of unspecified site of right female breast: Secondary | ICD-10-CM | POA: Diagnosis not present

## 2022-08-20 DIAGNOSIS — J910 Malignant pleural effusion: Secondary | ICD-10-CM | POA: Diagnosis not present

## 2022-08-20 LAB — CBC
HCT: 27.7 % — ABNORMAL LOW (ref 36.0–46.0)
Hemoglobin: 8.4 g/dL — ABNORMAL LOW (ref 12.0–15.0)
MCH: 26.2 pg (ref 26.0–34.0)
MCHC: 30.3 g/dL (ref 30.0–36.0)
MCV: 86.3 fL (ref 80.0–100.0)
Platelets: 547 10*3/uL — ABNORMAL HIGH (ref 150–400)
RBC: 3.21 MIL/uL — ABNORMAL LOW (ref 3.87–5.11)
RDW: 18.7 % — ABNORMAL HIGH (ref 11.5–15.5)
WBC: 12.1 10*3/uL — ABNORMAL HIGH (ref 4.0–10.5)
nRBC: 0 % (ref 0.0–0.2)

## 2022-08-20 LAB — BASIC METABOLIC PANEL
Anion gap: 5 (ref 5–15)
BUN: 18 mg/dL (ref 8–23)
CO2: 30 mmol/L (ref 22–32)
Calcium: 7.9 mg/dL — ABNORMAL LOW (ref 8.9–10.3)
Chloride: 109 mmol/L (ref 98–111)
Creatinine, Ser: 0.43 mg/dL — ABNORMAL LOW (ref 0.44–1.00)
GFR, Estimated: >60 mL/min (ref >60)
Glucose, Bld: 87 mg/dL (ref 70–99)
Potassium: 3.6 mmol/L (ref 3.5–5.1)
Sodium: 144 mmol/L (ref 135–145)

## 2022-08-20 NOTE — Progress Notes (Signed)
PCCM Brief Procedure Note  Right chest tube removed. Two sutures placed with sterile technique (gloves, mask, chloroprep). CXR ordered. Bedside RN updated to notify team if patient develops interval chest pain, shortness of breath etc.  Patient comfortable, on room air and no acute distress after procedure.

## 2022-08-20 NOTE — Hospital Course (Signed)
[name] is a 61 year old female with PMH right breast cancer, chronic right pleural malignant effusion, chronic cancer related pain, malnutrition who presented from home with complaints of fatigue, generalized weakness, loss of appetite.   Chest x-ray concerning for increased right pleural effusion with complete opacification of the right hemithorax.  Important events: 11/4-admit to the hospital. 11/5-right-sided thoracentesis 800 mL removed, LDH 439, white cell 84%.  MSSA from the pleural fluid. 11/7-PCCM consulted and transferred to ICU, started on vasopressors with left groin central line.  Treated with BiPAP. 11/8-worsening pleural effusion, emergent thoracentesis done persistent staph 11/10-pigtail catheter was placed and currently getting Lasix through the tube. 11/12 transfer to [facility] service.   Remains on pigtail catheter with tPA.   Remains in the hospital, chest tube management, IV antibiotics.  Frailty and debility. Chest tube is actively draining. 11/22: Chest tube removed

## 2022-08-20 NOTE — Plan of Care (Signed)
  Problem: Education: Goal: Knowledge of General Education information will improve Description: Including pain rating scale, medication(s)/side effects and non-pharmacologic comfort measures Outcome: Progressing   Problem: Health Behavior/Discharge Planning: Goal: Ability to manage health-related needs will improve Outcome: Progressing   Problem: Clinical Measurements: Goal: Will remain free from infection Outcome: Progressing Goal: Respiratory complications will improve Outcome: Progressing Patient chest tube was removed by physician today. No further respiratory complications. Will continue to monitor and report any abnormalities to physician.  Goal: Cardiovascular complication will be avoided Outcome: Progressing   Problem: Activity: Goal: Risk for activity intolerance will decrease Outcome: Progressing   Problem: Nutrition: Goal: Adequate nutrition will be maintained Outcome: Progressing   Problem: Coping: Goal: Level of anxiety will decrease Outcome: Progressing   Problem: Elimination: Goal: Will not experience complications related to bowel motility Outcome: Progressing Goal: Will not experience complications related to urinary retention Outcome: Progressing   Problem: Pain Managment: Goal: General experience of comfort will improve Outcome: Progressing   Problem: Safety: Goal: Ability to remain free from injury will improve Outcome: Progressing   Problem: Skin Integrity: Goal: Risk for impaired skin integrity will decrease Outcome: Progressing   Problem: Education: Goal: Ability to describe self-care measures that may prevent or decrease complications (Diabetes Survival Skills Education) will improve Outcome: Progressing   Problem: Coping: Goal: Ability to adjust to condition or change in health will improve Outcome: Progressing   Problem: Fluid Volume: Goal: Ability to maintain a balanced intake and output will improve Outcome: Progressing    Problem: Health Behavior/Discharge Planning: Goal: Ability to identify and utilize available resources and services will improve Outcome: Progressing   Problem: Nutritional: Goal: Maintenance of adequate nutrition will improve Outcome: Progressing   Problem: Skin Integrity: Goal: Risk for impaired skin integrity will decrease Outcome: Progressing   Problem: Tissue Perfusion: Goal: Adequacy of tissue perfusion will improve Outcome: Progressing

## 2022-08-20 NOTE — Progress Notes (Signed)
Progress Note    [name]   RXY:[id number]  DOB: 1961-09-28  DOA: 08/02/2022     16 PCP: Heath Lark, MD  Initial CC: Fatigue, weakness  Hospital Course: [name] is a 61 year old female with PMH right breast cancer, chronic right pleural malignant effusion, chronic cancer related pain, malnutrition who presented from home with complaints of fatigue, generalized weakness, loss of appetite.   Chest x-ray concerning for increased right pleural effusion with complete opacification of the right hemithorax.  Important events: 11/4-admit to the hospital. 11/5-right-sided thoracentesis 800 mL removed, LDH 439, white cell 84%.  MSSA from the pleural fluid. 11/7-PCCM consulted and transferred to ICU, started on vasopressors with left groin central line.  Treated with BiPAP. 11/8-worsening pleural effusion, emergent thoracentesis done persistent staph 11/10-pigtail catheter was placed and currently getting Lasix through the tube. 11/12 transfer to [facility] service.   Remains on pigtail catheter with tPA.   Remains in the hospital, chest tube management, IV antibiotics.  Frailty and debility. Chest tube is actively draining. 11/22: Chest tube removed  Interval History:  No events overnight.  Resting comfortably in bed when seen.  Obvious poor understanding of her disease state.  Assessment and Plan:  Recurrent right-sided pleural effusion:  - Secondary to breast cancer.  On presentation,Chest x-ray showed complete opacification of right hemithorax, Leftward shift of the trachea and mediastinal structures.  - s/p chest tube placement. Not a candidate for VATS.  PCCM team following.   - chest tube removed 11/22 - 6 week abx course recommended per pulmonology; end date 12/20; will d/c on Augmentin at discharge; continue ancef for now   Septic shock/MSSA empyema Continue current antibiotics.  Sepsis physiology has improved.  Fluid culture showed MSSA.  Repeat cultures on 11/14 with no  growth   Transient A-fib: Likely secondary to respiratory distress from empyema. CHA2DS2VAsc of 1   Breast cancer: Followed by Dr. Alvy Bimler.  On Verzenio and Arimidex at home. Continue anastrozole for now.   Chronic cancer rated pain: Continue oral Dilaudid as needed   Protein calorie malnutrition: On Remeron ,megace - RD following   Normocytic anemia: Currently hemoglobin stable.  Most likely associated with malignancy   Debility -Overall deconditioning in setting of underlying malignancy and poor nutritional status - Evaluated by PT, recommendation is for SNF - Awaiting placement   Old records reviewed in assessment of this patient   DVT prophylaxis:  enoxaparin (LOVENOX) injection 40 mg Start: 08/06/22 2200 Place and maintain sequential compression device Start: 08/06/22 1943   Code Status:   Code Status: DNR  Mobility Assessment (last 72 hours)     Mobility Assessment     Row Name 08/19/22 2100 08/19/22 0803 08/18/22 1330       Does patient have an order for bedrest or is patient medically unstable No - Continue assessment No - Continue assessment --     What is the highest level of mobility based on the progressive mobility assessment? -- Level 4 (Walks with assist in room) - Balance while marching in place and cannot step forward and back - Complete Level 4 (Walks with assist in room) - Balance while marching in place and cannot step forward and back - Complete     Is the above level different from baseline mobility prior to current illness? -- Yes - Recommend PT order --              Barriers to discharge:  Disposition Plan:  SNF Status is: Inpt  Objective:  Blood pressure 116/83, pulse (!) 108, temperature 99.8 F (37.7 C), temperature source Oral, resp. rate 20, height '5\' 6"'$  (1.676 m), weight 68.9 kg, SpO2 100 %.  Examination:  Physical Exam Constitutional:      Appearance: Normal appearance.  HENT:     Head: Normocephalic and atraumatic.      Mouth/Throat:     Mouth: Mucous membranes are moist.  Eyes:     Extraocular Movements: Extraocular movements intact.  Cardiovascular:     Rate and Rhythm: Normal rate and regular rhythm.  Pulmonary:     Comments: Coarse breath sounds Abdominal:     General: Bowel sounds are normal. There is no distension.     Palpations: Abdomen is soft.     Tenderness: There is no abdominal tenderness.  Musculoskeletal:        General: Normal range of motion.     Cervical back: Normal range of motion.  Skin:    General: Skin is warm.  Neurological:     General: No focal deficit present.     Mental Status: She is alert.  Psychiatric:        Mood and Affect: Mood normal.      Consultants:  PCCM Oncology   Procedures:    Data Reviewed: Results for orders placed or performed during the hospital encounter of 08/02/22 (from the past 24 hour(s))  CBC     Status: Abnormal   Collection Time: 08/20/22  4:47 AM  Result Value Ref Range   WBC 12.1 (H) 4.0 - 10.5 K/uL   RBC 3.21 (L) 3.87 - 5.11 MIL/uL   Hemoglobin 8.4 (L) 12.0 - 15.0 g/dL   HCT 27.7 (L) 36.0 - 46.0 %   MCV 86.3 80.0 - 100.0 fL   MCH 26.2 26.0 - 34.0 pg   MCHC 30.3 30.0 - 36.0 g/dL   RDW 18.7 (H) 11.5 - 15.5 %   Platelets 547 (H) 150 - 400 K/uL   nRBC 0.0 0.0 - 0.2 %  Basic metabolic panel     Status: Abnormal   Collection Time: 08/20/22  4:47 AM  Result Value Ref Range   Sodium 144 135 - 145 mmol/L   Potassium 3.6 3.5 - 5.1 mmol/L   Chloride 109 98 - 111 mmol/L   CO2 30 22 - 32 mmol/L   Glucose, Bld 87 70 - 99 mg/dL   BUN 18 8 - 23 mg/dL   Creatinine, Ser 0.43 (L) 0.44 - 1.00 mg/dL   Calcium 7.9 (L) 8.9 - 10.3 mg/dL   GFR, Estimated >60 >60 mL/min   Anion gap 5 5 - 15    I have Reviewed nursing notes, Vitals, and Lab results since pt's last encounter. Pertinent lab results : see above I have ordered test including BMP, CBC, Mg I have reviewed the last note from staff over past 24 hours I have discussed pt's care  plan and test results with nursing staff, case manager  Time spent: Greater than 50% of the 55 minute visit was spent in counseling/coordination of care for the patient as laid out in the A&P.    LOS: 16 days   Dwyane Dee, MD [facility] 08/20/2022, 2:30 PM

## 2022-08-20 NOTE — Progress Notes (Signed)
[name]   DOB:[birth year]   XH#:[id number]    ASSESSMENT & PLAN:  Metastatic breast cancer to the lung and bones While she responded very well to chemotherapy and antiestrogen therapy, she is not thriving due to progressive weight loss and weakness While hospitalized, I recommend we continue on antiestrogen therapy but hold abemaciclib Recent CT imaging showed continuous improvement in response to therapy despite being off abemaciclib She will continue anastrozole I spent a lot of time explaining this to her mother. She does not understand why her chemotherapy (abemaciclib was stopped) I do not recommend abemaciclib while she is on antibiotics   Recurrent malignant pleural effusion with MSSA empyema I would defer management to pulmonologist/ICU team She would likely be on several weeks of antibiotics I reviewed plan of care with pulmonologist yesterday and today Her mother does not understand why the pleural effusion did not resolve. I will try to arrange a meeting with pulmonologist tomorrow   malignant cachexia Failure to thrive Severe protein calorie malnutrition I encouraged the patient to continue frequent small meals Reviewed documentation from dietitian.  She had average approximately 50% of caloric requirement. This is one of the main causes of delayed healing   Cancer associated pain She had recent confusion episode/altered mental status due to narcotics Her pain control has improved when her pain medicine is on a scheduled basis I do not recommend schedule long acting narcotics due to her frail status and propensity to get confused   Generalized weakness Failure to thrive Multifactorial, related to progressive weight loss, muscle loss and deconditioning Recommend skilled nursing facility placement upon discharge I will defer this to care management team/social worker to arrange. Her mother expressed understanding   Code Status With a progressive decline in empyema, I  reviewed the CODE STATUS with the patient twice on November 9 Ultimately, she agreed with changing her CODE STATUS to DO NOT RESUSCITATE I discussed this with Emireth's mother today   Goals of care Improvement of weakness, infection and nutrition status   Discharge planning Unknown at this point I updated her mother  She has difficulties understanding why the patient fails to improve. I will set up family meeting at 9 am tomorrow  All questions were answered. The patient knows to call the clinic with any problems, questions or concerns.   The total time spent in the appointment was 55 minutes encounter with patients including review of chart and various tests results, discussions about plan of care and coordination of care plan  Heath Lark, MD 08/20/2022 8:55 AM  Subjective:  I saw her today as her chest tube is being removed. She is feeling ok, denies excessive pain. Denies constipation. I spoke with her mother and tried my best to explain the overall plan of care  Objective:  Vitals:   08/19/22 2006 08/20/22 0540  BP: 116/75 103/82  Pulse: (!) 110 (!) 107  Resp: 20 16  Temp: 98.5 F (36.9 C) 98.9 F (37.2 C)  SpO2: 98% 100%     Intake/Output Summary (Last 24 hours) at 08/20/2022 0855 Last data filed at 08/19/2022 1900 Gross per 24 hour  Intake 744 ml  Output 760 ml  Net -16 ml    GENERAL:alert, no distress and comfortable NEURO: alert & oriented x 3 with fluent speech, no focal motor/sensory deficits   Labs:  Recent Labs    08/05/22 0458 08/06/22 0456 08/06/22 1549 08/07/22 0428 08/08/22 0500 08/09/22 1113 08/15/22 1439 08/17/22 1305 08/20/22 0447  NA 136 136  --  137 138   < > 141 142 144  K 4.0 4.2  --  4.2 4.2   < > 4.0 3.9 3.6  CL 103 102  --  105 104   < > 109 109 109  CO2 25 28  --  28 29   < > '29 24 30  '$ GLUCOSE 129* 124*  --  135* 135*   < > 184* 107* 87  BUN 33* 35*  --  30* 25*   < > 24* 22 18  CREATININE 1.85* 1.38*  --  1.08* 0.80   < > 0.67  0.61 0.43*  CALCIUM 7.1* 8.0*  --  7.7* 7.8*   < > 7.0* 7.4* 7.9*  GFRNONAA 30* 43*  --  58* >60   < > >60 >60 >60  PROT 6.3* 6.0* 6.1* 6.0*  --   --   --   --   --   ALBUMIN 2.4* 2.3*  --  2.2* 2.1*  --   --   --   --   AST 22 18  --  20  --   --   --   --   --   ALT 23 21  --  20  --   --   --   --   --   ALKPHOS 50 49  --  49  --   --   --   --   --   BILITOT 0.3 0.4  --  0.3  --   --   --   --   --    < > = values in this interval not displayed.    Studies:  DG CHEST PORT 1 VIEW  Result Date: 08/20/2022 CLINICAL DATA:  Pleural effusion. EXAM: PORTABLE CHEST 1 VIEW COMPARISON:  August 19, 2022. FINDINGS: Stable cardiomediastinal silhouette. Stable position of right-sided chest tube. No definite pneumothorax is noted currently. Stable bibasilar atelectasis and effusions are noted. Bony thorax is unremarkable. IMPRESSION: Stable position of right-sided chest tube. Stable bibasilar atelectasis and effusions. Electronically Signed   By: Marijo Conception M.D.   On: 08/20/2022 08:11   DG Chest 2 View  Result Date: 08/19/2022 CLINICAL DATA:  Pleural effusion, chest tube EXAM: CHEST - 2 VIEW COMPARISON:  08/18/2022 FINDINGS: Pigtail RIGHT thoracostomy tube again seen. Normal heart size and mediastinal contours. Slight rotation to the RIGHT. Bibasilar effusions and atelectasis. Tiny pneumothorax at RIGHT apex. No acute infiltrate or significant bony findings. IMPRESSION: BILATERAL pleural effusions and basilar atelectasis. Tiny RIGHT pneumothorax despite thoracostomy tube. Electronically Signed   By: Lavonia Dana M.D.   On: 08/19/2022 14:48   DG CHEST PORT 1 VIEW  Result Date: 08/18/2022 CLINICAL DATA:  Pleural effusion. EXAM: PORTABLE CHEST 1 VIEW COMPARISON:  Chest radiograph 08/17/2022 FINDINGS: Right basilar chest tube is stable in position. Patient is rotated towards the right which limits evaluation of this examination. Patient had a small right apical pneumothorax on the previous  examination which is probably unchanged but poorly characterized. Increased densities near the lateral right lung base could be related to overlying shadows but cannot exclude re-accumulation of pleural fluid. Persistent small to moderate sized left pleural effusion. Patchy parenchymal densities in both lungs compatible with atelectasis and difficult to exclude mild edema. Heart size is stable. IMPRESSION: 1. Limited examination due to patient rotation. 2. Right chest tube is stable in position. Patient had a small right apical pneumothorax on the previous examination which is probably unchanged but poorly characterized.  3. Increased densities near the lateral right lung base could be related to overlying shadows but cannot exclude re-accumulation of pleural fluid. 4. Persistent small to moderate sized left pleural effusion. Electronically Signed   By: Markus Daft M.D.   On: 08/18/2022 08:19   DG CHEST PORT 1 VIEW  Result Date: 08/17/2022 CLINICAL DATA:  Follow-up pleural effusions. EXAM: PORTABLE CHEST 1 VIEW COMPARISON:  08/15/2022 FINDINGS: Right pleural pigtail catheter remains in place. Minimal residual pleural fluid is noted, as well as a 5% right apical pneumothorax. Small left pleural effusion and left basilar atelectasis shows no significant change. Cardiomegaly remains stable. IMPRESSION: Minimal residual right pleural fluid and 5% right apical pneumothorax. Right pleural pigtail catheter remains in place. Stable small left pleural effusion and left basilar atelectasis. Electronically Signed   By: Marlaine Hind M.D.   On: 08/17/2022 14:12   VAS [place] LOWER EXTREMITY VENOUS (DVT)  Result Date: 08/15/2022  Lower Venous DVT Study Patient Name:  [name]  Date of Exam:   08/14/2022 Medical Rec #: [id number]        Accession #:    [id number] Date of Birth: [birth year]         Patient Gender: F Patient Age:   61 years Exam Location:  [facility] Procedure:      VAS [place] LOWER EXTREMITY VENOUS  (DVT) Referring Phys: Noemi Chapel --------------------------------------------------------------------------------  Indications: Edema.  Risk Factors: None identified. Limitations: Poor ultrasound/tissue interface. Comparison Study: No prior studies. Performing Technologist: Oliver Hum RVT  Examination Guidelines: A complete evaluation includes B-mode imaging, spectral Doppler, color Doppler, and power Doppler as needed of all accessible portions of each vessel. Bilateral testing is considered an integral part of a complete examination. Limited examinations for reoccurring indications may be performed as noted. The reflux portion of the exam is performed with the patient in reverse Trendelenburg.  +---------+---------------+---------+-----------+----------+--------------+ RIGHT    CompressibilityPhasicitySpontaneityPropertiesThrombus Aging +---------+---------------+---------+-----------+----------+--------------+ CFV      Full           Yes      Yes                                 +---------+---------------+---------+-----------+----------+--------------+ SFJ      Full                                                        +---------+---------------+---------+-----------+----------+--------------+ FV Prox  Full                                                        +---------+---------------+---------+-----------+----------+--------------+ FV Mid   Full                                                        +---------+---------------+---------+-----------+----------+--------------+ FV DistalFull                                                        +---------+---------------+---------+-----------+----------+--------------+  PFV      Full                                                        +---------+---------------+---------+-----------+----------+--------------+ POP      Full           Yes      Yes                                  +---------+---------------+---------+-----------+----------+--------------+ PTV      Full                                                        +---------+---------------+---------+-----------+----------+--------------+ PERO     Full                                                        +---------+---------------+---------+-----------+----------+--------------+   +---------+---------------+---------+-----------+----------+--------------+ LEFT     CompressibilityPhasicitySpontaneityPropertiesThrombus Aging +---------+---------------+---------+-----------+----------+--------------+ CFV      Full           Yes      Yes                                 +---------+---------------+---------+-----------+----------+--------------+ SFJ      Full                                                        +---------+---------------+---------+-----------+----------+--------------+ FV Prox  Full                                                        +---------+---------------+---------+-----------+----------+--------------+ FV Mid   Full                                                        +---------+---------------+---------+-----------+----------+--------------+ FV DistalFull                                                        +---------+---------------+---------+-----------+----------+--------------+ PFV      Full                                                        +---------+---------------+---------+-----------+----------+--------------+  POP      Full           Yes      Yes                                 +---------+---------------+---------+-----------+----------+--------------+ PTV      Full                                                        +---------+---------------+---------+-----------+----------+--------------+ PERO     Full                                                         +---------+---------------+---------+-----------+----------+--------------+     Summary: RIGHT: - There is no evidence of deep vein thrombosis in the lower extremity.  - No cystic structure found in the popliteal fossa.  LEFT: - There is no evidence of deep vein thrombosis in the lower extremity.  - No cystic structure found in the popliteal fossa.  *See table(s) above for measurements and observations. Electronically signed by Deitra Mayo MD on 08/15/2022 at 3:02:04 PM.    Final    DG CHEST PORT 1 VIEW  Result Date: 08/15/2022 CLINICAL DATA:  142230 Pleural effusion [id number] EXAM: PORTABLE CHEST 1 VIEW COMPARISON:  August 14, 2022 FINDINGS: The cardiomediastinal silhouette is unchanged in contour.RIGHT-sided chest tube. RIGHT-sided hydropneumothorax with a mildly decreased fluid component in comparison to prior thereby mildly increased conspicuity of the air component. Moderate LEFT pleural effusion. Bibasilar opacities, similar in comparison to prior. Revisualization of a peripheral RIGHT rind of airspace opacities. IMPRESSION: 1. RIGHT-sided hydropneumothorax with mild decrease in the fluid component and increased conspicuity of the air component. There is likely a corresponding corticated RIGHT lung. 2. Moderate LEFT pleural effusion, similar in comparison to prior. Electronically Signed   By: Valentino Saxon M.D.   On: 08/15/2022 09:52   DG CHEST PORT 1 VIEW  Result Date: 08/14/2022 CLINICAL DATA:  Right chest tube, bilateral pleural effusions EXAM: PORTABLE CHEST 1 VIEW COMPARISON:  Previous studies including the examination of 08/13/2022 FINDINGS: Transverse diameter of heart is increased. Right chest tube is noted with its tip in the medial right lower lung field. Moderate bilateral pleural effusions are seen, more so on the left side. There is interval decrease in right pleural effusion. Sharp appearance of right cardiac margin may suggest loculated pneumothorax in the right lower lung  field with interval decrease. There is no demonstrable apical pneumothorax. IMPRESSION: Cardiomegaly. Bilateral pleural effusions, more so on the left side. There is interval decrease in right pleural effusion and possible decrease in loculated pneumothorax in right lower lung field. There is no demonstrable apical pneumothorax. Electronically Signed   By: Elmer Picker M.D.   On: 08/14/2022 08:21   DG CHEST PORT 1 VIEW  Result Date: 08/13/2022 CLINICAL DATA:  Pleural effusion EXAM: PORTABLE CHEST 1 VIEW COMPARISON:  CXR 08/12/22 FINDINGS: Right-sided pigtail pleural drainage catheter in place, unchanged in appearance from prior exam. Redemonstrated is a large right-sided hydropneumothorax which overall appears similar to prior exam with unchanged appearance of the air component  along the apical and lateral margins of the right lung and the fluid component layering at the right lung base. The appearance of the left lung is also unchanged from prior exam with a persistent small layering pleural effusion. There is no new focal airspace opacity. Cardiac and mediastinal contours are unchanged from prior exam. No new displaced rib fracture. Visualized upper abdomen is unremarkable. IMPRESSION: 1. No significant change in appearance of the large right-sided hydropneumothorax with a pigtail pleural drainage catheter in place. 2.  Unchanged layering moderate sized left pleural effusion. Electronically Signed   By: Marin Roberts M.D.   On: 08/13/2022 07:21   DG CHEST PORT 1 VIEW  Result Date: 08/12/2022 CLINICAL DATA:  [id number] with right hydropneumothorax, left pleural effusion. EXAM: PORTABLE CHEST 1 VIEW COMPARISON:  Portable chest yesterday at 7:28 a.m. FINDINGS: 4:29 a.m. Pigtail chest tube with tip in the medial right chest base is similar in positioning. Moderate to large bilateral pleural effusions, on the left layering posteriorly without pneumothorax, on the right again with loculated  hydropneumothorax and extrapleural air again noted laterally. The right hydropneumothorax is estimated to occupy much as 50% of the right chest volume with the hydro component predominating. There is overlying consolidation or atelectasis in the lower lung fields. The overall aeration pattern seems unchanged. There is cardiomegaly with mild central vascular prominence. No overt edema is seen. Stable mediastinum. Again noted is a destructive lesion of the lateral right fifth rib likely metastasis. The patient is rotated to the right. IMPRESSION: 1. No significant change in the appearance of the chest since yesterday's study. 2. The right hydropneumothorax is estimated to occupy much as 50% of the right chest volume with the hydro component predominating. 3. There is overlying consolidation or atelectasis in the lower lung fields with moderate-to-large layering left effusion. 4. Cardiomegaly with mild central vascular prominence. Electronically Signed   By: Telford Nab M.D.   On: 08/12/2022 06:03   DG Chest Port 1 View  Result Date: 08/11/2022 CLINICAL DATA:  Pleural effusion, metastatic breast carcinoma EXAM: PORTABLE CHEST 1 VIEW COMPARISON:  Previous studies including the examination of 08/10/2022 FINDINGS: Apparent shift of mediastinum to the right may be due to rotation. Transverse diameter of heart is increased. Moderate to large bilateral pleural effusions are seen. There is interval decrease in right pleural effusion. Single right chest tube is seen. Central pulmonary vessels are less prominent. Evaluation of right mid and both lower lung fields for infiltrates is limited by the effusions. There is no pneumothorax. IMPRESSION: Moderate to large bilateral pleural effusions. There is interval decrease in right pleural effusion. Electronically Signed   By: Elmer Picker M.D.   On: 08/11/2022 09:29   CT CHEST WO CONTRAST  Result Date: 08/10/2022 CLINICAL DATA:  Metastatic breast cancer.   Empyema. * Tracking Code: BO * EXAM: CT CHEST WITHOUT CONTRAST TECHNIQUE: Multidetector CT imaging of the chest was performed following the standard protocol without IV contrast. RADIATION DOSE REDUCTION: This exam was performed according to the departmental dose-optimization program which includes automated exposure control, adjustment of the mA and/or kV according to patient size and/or use of iterative reconstruction technique. COMPARISON:  06/25/2022 FINDINGS: Cardiovascular: Mild atherosclerotic calcification of the aortic arch. Mediastinum/Nodes: There is hazy diffuse edema/infiltration of the mediastinal and subcutaneous adipose tissues compatible with third spacing of fluid. Lungs/Pleura: Large loculated right hydropneumothorax. Pigtail right pleural drainage catheter in place. IDA. Made only about 25% of the right lung is aerated. There is scattered atelectasis in  the right lung. Moderate to large left pleural effusion encompassing about 1/3 of the left hemithoracic volume. No definite mass or nodule in the aerated portion of the left lung. Upper Abdomen: Stable small hypodense hepatic lesions, compatible with cysts. Stable benign cyst in the left kidney upper pole, not requiring further imaging workup. Musculoskeletal: The CT scan from 06/25/2022 showed enhancing lesions in the right chest wall and axilla. These are substantially less conspicuous today although likely mainly from lack of IV contrast rather than resolution. A right axillary mass or lymph node measures 1.8 cm in short axis on image 52 series 2, formerly 1.9 cm. Bony destructive findings of the right fifth rib laterally as on image 62 series 5. Sclerotic focus possibly a healing fracture in the right eighth rib laterally. Extensive sclerosis in the sternum favoring metastatic disease over infection. Nearly complete central collapse of the T3 vertebral body with associated sclerosis and minimal posterior bony retropulsion unchanged from prior.  IMPRESSION: 1. Large loculated right hydropneumothorax encompassing about 75% of right hemithoracic volume. Pigtail right pleural drainage catheter in place. 2. Moderate to large left pleural effusion encompassing about 1/3 of the left hemithoracic volume. 3. Right axillary mass or lymph node measures 1.8 cm in short axis, formerly 1.9 cm. Other previously seen chest wall soft tissue lesions are less conspicuous on today's exam due to lack of IV contrast, but likely still present. 4. Bony destructive findings of the right fifth rib laterally compatible with metastatic disease. 5. Extensive sclerosis in the sternum favoring metastatic disease over infection. 6. Nearly complete central collapse of the T3 vertebral body with associated sclerosis and minimal posterior bony retropulsion, unchanged from prior. 7. Diffuse hazy edema/infiltration of the mediastinal and subcutaneous adipose tissues compatible with third spacing of fluid. 8. Aortic atherosclerosis. Aortic Atherosclerosis (ICD10-I70.0). Electronically Signed   By: Van Clines M.D.   On: 08/10/2022 10:39   DG Chest Port 1 View  Result Date: 08/10/2022 CLINICAL DATA:  Pleural effusion. Follow-up. Acute respiratory failure. EXAM: PORTABLE CHEST 1 VIEW COMPARISON:  08/08/2022 FINDINGS: There is a right-sided pigtail thoracostomy tube overlying the medial right lower lung. Right-sided, locule hydropneumothorax is identified. No significant change in volume compared with the previous exam. Moderate left pleural effusion appears increased in volume. IMPRESSION: 1. No significant change in volume of right-sided hydropneumothorax. 2. Increase in volume of moderate left pleural effusion. 3. Stable position of right thoracostomy tube. Electronically Signed   By: Kerby Moors M.D.   On: 08/10/2022 09:07   DG Chest Port 1 View  Result Date: 08/09/2022 CLINICAL DATA:  Acute respiratory failure, right pleural effusion EXAM: PORTABLE CHEST 1 VIEW  COMPARISON:  08/08/2022 FINDINGS: Interval placement of right basilar chest tube. Decreasing right pleural effusion. Moderate right effusion remains. No pneumothorax. Extensive airspace disease throughout the right lung. Left perihilar and lower lobe airspace disease with layering left effusion. Cardiomegaly. No acute bony abnormality. IMPRESSION: Interval placement of right chest tube with decreasing right pleural effusion and slight improved aeration within the right lung. Continued diffuse airspace disease on the right and in the left lower lung. Bilateral effusions. No pneumothorax. Electronically Signed   By: Rolm Baptise M.D.   On: 08/09/2022 08:12   CT [facility] PLEURAL DRAIN W/INDWELL CATH W/IMG GUIDE  Result Date: 08/08/2022 INDICATION: 61 year old woman with right empyema presents to IR for chest tube placement EXAM: CT-guided right chest tube placement TECHNIQUE: Multidetector CT imaging of the chest was performed following the standard protocol without IV contrast. RADIATION  DOSE REDUCTION: This exam was performed according to the departmental dose-optimization program which includes automated exposure control, adjustment of the mA and/or kV according to patient size and/or use of iterative reconstruction technique. MEDICATIONS: The patient is currently admitted to the hospital and receiving intravenous antibiotics. The antibiotics were administered within an appropriate time frame prior to the initiation of the procedure. ANESTHESIA/SEDATION: Moderate (conscious) sedation was employed during this procedure. A total of Versed 2 mg and Fentanyl 100 mcg was administered intravenously by the radiology nurse. Total intra-service moderate Sedation Time: 14 minutes. The patient's level of consciousness and vital signs were monitored continuously by radiology nursing throughout the procedure under my direct supervision. COMPLICATIONS: None immediate. PROCEDURE: Informed written consent was obtained from the  patient after a thorough discussion of the procedural risks, benefits and alternatives. All questions were addressed. Maximal Sterile Barrier Technique was utilized including caps, mask, sterile gowns, sterile gloves, sterile drape, hand hygiene and skin antiseptic. A timeout was performed prior to the initiation of the procedure. Patient positioned left lateral decubitus on the procedure table. The right lateral chest wall skin prepped and draped in usual fashion. Following local lidocaine administration, right pleural space was accessed with 19 gauge Yueh needle utilizing CT guidance. Yueh catheter removed over 0.035 inch Amplatz guidewire, serial dilation performed, and 14 [place] multipurpose pigtail drain inserted. CT confirmed appropriate positioning of the drain within the right pleural space. The drain was connected to Pleur-Evac and secured to skin with suture. IMPRESSION: CT-guided right chest tube (14 [place]) as above. Electronically Signed   By: Miachel Roux M.D.   On: 08/08/2022 17:02   DG Chest Port 1 View  Result Date: 08/08/2022 CLINICAL DATA:  Follow-up pleural effusion. EXAM: PORTABLE CHEST 1 VIEW COMPARISON:  08/07/2022 FINDINGS: Exam detail is diminished due to patient positioning with significant rightward rotational artifact. The cardiomediastinal contours appear unchanged. There is a large right pleural effusion which is unchanged in volume compared with the previous exam. There is significantly diminished aeration to the right lung with only a small amount of aerated lung in the right upper lobe. Small left pleural effusion is unchanged. IMPRESSION: 1. No change in large right pleural effusion with significantly diminished aeration to the right lung. Electronically Signed   By: Kerby Moors M.D.   On: 08/08/2022 07:05   DG Chest Port 1 View  Result Date: 08/07/2022 CLINICAL DATA:  Pleural effusion EXAM: PORTABLE CHEST 1 VIEW COMPARISON:  08/06/2022 FINDINGS: No significant change in  rotated AP portable examination. Large right pleural effusion with near total atelectasis or consolidation of the right lung, as well as a small, layering left pleural effusion. Cardiomegaly, the cardiac borders largely obscured. IMPRESSION: No significant change in rotated AP portable examination. Large right pleural effusion with near total atelectasis or consolidation of the right lung, as well as a small, layering left pleural effusion. Electronically Signed   By: Delanna Ahmadi M.D.   On: 08/07/2022 09:17   DG Chest Port 1 View  Result Date: 08/06/2022 CLINICAL DATA:  S/P thoracentesis EXAM: PORTABLE CHEST 1 VIEW COMPARISON:  Same day chest x-ray. FINDINGS: Decreased but still large right pleural effusion. Small left pleural effusion. Bilateral overlying opacities. No visible pneumothorax on this semi erect radiograph. Cardiomediastinal silhouette is similar. Polyarticular degenerative change. IMPRESSION: 1. Decreased but still large right pleural effusion. No visible pneumothorax on this semi erect radiograph. 2. Small left pleural effusion 3. Bilateral overlying opacities. Electronically Signed   By: Jamesetta So.D.  On: 08/06/2022 15:50   DG Chest 1 View  Result Date: 08/06/2022 CLINICAL DATA:  Respiratory failure. EXAM: CHEST  1 VIEW COMPARISON:  Multiple recent chest x-rays. FINDINGS: Persistent complete opacification of the right hemithorax due to a large pleural effusion. Slight shift of the heart and mediastinum to the left exaggerated by the rotation of the patient and thoracic scoliosis. Persistent left lower lobe atelectasis but no left pleural effusion. IMPRESSION: Persistent complete opacification of the right hemithorax. Electronically Signed   By: Marijo Sanes M.D.   On: 08/06/2022 11:48   DG Chest Port 1 View  Result Date: 08/05/2022 CLINICAL DATA:  Shortness of breath EXAM: PORTABLE CHEST 1 VIEW COMPARISON:  08/05/2022, 06/25/2022, 08/03/2022 FINDINGS: Complete opacification  of right thorax without change. Subsegmental atelectasis left base. Partially obscured cardiomediastinal silhouette. IMPRESSION: Complete opacification of the right thorax without change, probably due to sizable pleural effusion. Subsegmental atelectasis left base Electronically Signed   By: Donavan Foil M.D.   On: 08/05/2022 23:33   ECHOCARDIOGRAM COMPLETE  Result Date: 08/05/2022    ECHOCARDIOGRAM REPORT   Patient Name:   [name] Date of Exam: 08/05/2022 Medical Rec #:  [id number]       Height:       66.0 in Accession #:    [id number]      Weight:       118.6 lb Date of Birth:  1960/11/28        BSA:          1.602 m Patient Age:    86 years        BP:           97/44 mmHg Patient Gender: F               HR:           78 bpm. Exam Location:  Inpatient Procedure: 2D Echo, Cardiac Doppler and Color Doppler Indications:    R94.31 Abnormal EKG  History:        Patient has no prior history of Echocardiogram examinations.                 Risk Factors:Hypertension. Metastatic breast cancer.  Sonographer:    Garrett Referring Phys: [id number] AMRIT ADHIKARI  Sonographer Comments: Technically difficult study due to poor echo windows, suboptimal apical window and suboptimal subcostal window. Apical images off axis due to the hardened texture of the left breast. Could not turn patient. Wound dressings in subcostal region. IMPRESSIONS  1. Left ventricular ejection fraction, by estimation, is 55 to 60%. The left ventricle has normal function. The left ventricle has no regional wall motion abnormalities. Left ventricular diastolic parameters were normal.  2. Right ventricular systolic function is normal. The right ventricular size is normal. Mildly increased right ventricular wall thickness. There is mildly elevated pulmonary artery systolic pressure. The estimated right ventricular systolic pressure is 35.3 mmHg.  3. Left atrial size was mildly dilated.  4. A small pericardial effusion is present. The pericardial  effusion is circumferential. There is no evidence of cardiac tamponade. Large pleural effusion in the right lateral region.  5. 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 Tricuspid valve regurgitation is mild to moderate.  7. The aortic valve is tricuspid. Aortic valve regurgitation is not visualized. No aortic stenosis is present.  8. The inferior vena cava is normal  in size with greater than 50% respiratory variability, suggesting right atrial pressure of 3 mmHg. FINDINGS  Left Ventricle: Left ventricular ejection fraction, by estimation, is 55 to 60%. The left ventricle has normal function. The left ventricle has no regional wall motion abnormalities. The left ventricular internal cavity size was normal in size. There is  no left ventricular hypertrophy. Left ventricular diastolic parameters were normal. Right Ventricle: The right ventricular size is normal. Mildly increased right ventricular wall thickness. Right ventricular systolic function is normal. There is mildly elevated pulmonary artery systolic pressure. The tricuspid regurgitant velocity is 3.19 m/s, and with an assumed right atrial pressure of 3 mmHg, the estimated right ventricular systolic pressure is 87.5 mmHg. Left Atrium: Left atrial size was mildly dilated. Right Atrium: Right atrial size was normal in size. Pericardium: Complex septated fluid collection in the right pleural cavity, consider empyema or malignant effusion. A small pericardial effusion is present. The pericardial effusion is circumferential. There is no evidence of cardiac tamponade. 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 There is mild thickening of the mitral valve leaflet(s). Mild mitral valve regurgitation, with centrally-directed jet. Tricuspid Valve: The  tricuspid valve is myxomatous. Tricuspid valve regurgitation is mild to moderate. Aortic Valve: The aortic valve is tricuspid. Aortic valve regurgitation is not visualized. No aortic stenosis is present. Pulmonic Valve: The pulmonic valve was normal in structure. Pulmonic valve regurgitation is mild. Aorta: The aortic root and ascending aorta are structurally normal, with no evidence of dilitation. Venous: IVC assessment for right atrial pressure unable to be performed due to mechanical ventilation. The inferior vena cava is normal in size with greater than 50% respiratory variability, suggesting right atrial pressure of 3 mmHg. IAS/Shunts: No atrial level shunt detected by color flow Doppler. Additional Comments: There is a large pleural effusion in the right lateral region.  LEFT VENTRICLE PLAX 2D LVIDd:         4.43 cm     Diastology LVIDs:         2.80 cm     LV e' medial:    5.08 cm/s LV PW:         1.03 cm     LV E/e' medial:  10.3 LV IVS:        0.87 cm     LV e' lateral:   6.46 cm/s LVOT diam:     2.10 cm     LV E/e' lateral: 8.1 LV SV:         44 LV SV Index:   28 LVOT Area:     3.46 cm  LV Volumes (MOD) LV vol d, MOD A4C: 57.5 ml LV vol s, MOD A4C: 18.7 ml LV SV MOD A4C:     57.5 ml RIGHT VENTRICLE             IVC RV S prime:     13.10 cm/s  IVC diam: 1.10 cm TAPSE (M-mode): 1.7 cm LEFT ATRIUM           Index        RIGHT ATRIUM           Index LA diam:      3.20 cm 2.00 cm/m   RA Area:     14.20 cm LA Vol (A4C): 57.5 ml 35.90 ml/m  RA Volume:   35.90 ml  22.42 ml/m  AORTIC VALVE  PULMONIC VALVE LVOT Vmax:   75.15 cm/s  PR End Diast Vel: 2.14 msec LVOT Vmean:  45.850 cm/s LVOT VTI:    0.127 m  AORTA Ao Root diam: 2.60 cm Ao Asc diam:  2.70 cm MITRAL VALVE               TRICUSPID VALVE MV Area (PHT): 4.89 cm    TR Peak grad:   40.7 mmHg MV Decel Time: 155 msec    TR Vmax:        319.00 cm/s MV E velocity: 52.40 cm/s MV A velocity: 50.00 cm/s  SHUNTS MV E/A ratio:  1.05        Systemic VTI:   0.13 m                            Systemic Diam: 2.10 cm Dani Gobble Croitoru MD Electronically signed by Sanda Klein MD Signature Date/Time: 08/05/2022/12:38:14 PM    Final    DG CHEST PORT 1 VIEW  Result Date: 08/05/2022 CLINICAL DATA:  Right pleural effusion. EXAM: PORTABLE CHEST 1 VIEW COMPARISON:  08/05/2022 FINDINGS: Stable cardiomediastinal contours. Left lung is clear. Complete opacification of the right hemithorax is again noted and appears unchanged. Findings are favored to represent known large right pleural effusion. IMPRESSION: 1. No change in complete opacification of the right hemithorax which is presumed to represent large right pleural effusion. Electronically Signed   By: Kerby Moors M.D.   On: 08/05/2022 10:06   DG CHEST PORT 1 VIEW  Result Date: 08/05/2022 CLINICAL DATA:  Respiratory compromise EXAM: PORTABLE CHEST 1 VIEW COMPARISON:  08/03/2022 FINDINGS: Complete opacification of the right hemithorax, presumably reflecting a large pleural effusion. Left lung is clear. No pneumothorax. The heart is normal in size. IMPRESSION: Complete opacification of the right hemithorax, presumably reflecting a large pleural effusion. Electronically Signed   By: Julian Hy M.D.   On: 08/05/2022 00:41   US THORACENTESIS ASP PLEURAL SPACE W/IMG GUIDE  Result Date: 08/03/2022 INDICATION: Patient with history of right-sided breast cancer, right pleural effusion. Request is made for diagnostic and therapeutic thoracentesis. EXAM: ULTRASOUND GUIDED RIGHT THORACENTESIS MEDICATIONS: 10 mL 1% lidocaine COMPLICATIONS: None immediate. PROCEDURE: An ultrasound guided thoracentesis was thoroughly discussed with the patient and questions answered. The benefits, risks, alternatives and complications were also discussed. The patient understands and wishes to proceed with the procedure. Written consent was obtained. Ultrasound was performed to localize and mark an adequate pocket of fluid in the right chest.  The area was then prepped and draped in the normal sterile fashion. 1% Lidocaine was used for local anesthesia. Under ultrasound guidance a 6 Fr Safe-T-Centesis catheter was introduced. Thoracentesis was performed. The catheter was removed and a dressing applied. FINDINGS: A total of approximately 800 mL of yellow fluid was removed. Samples were sent to the laboratory as requested by the clinical team. IMPRESSION: Successful ultrasound guided right thoracentesis yielding 800 mL of pleural fluid. Read by: Brynda Greathouse PA-C Electronically Signed   By: Sandi Mariscal M.D.   On: 08/03/2022 13:06   DG Chest Port 1 View  Result Date: 08/03/2022 CLINICAL DATA:  Post right thoracentesis EXAM: PORTABLE CHEST 1 VIEW COMPARISON:  08/02/2022 FINDINGS: Continued complete opacification of the right hemithorax. No visible pneumothorax. No confluent opacity or effusion on the left. Heart is normal size. IMPRESSION: Continued complete opacification of the right hemithorax. No visible pneumothorax. Electronically Signed   By: Rolm Baptise M.D.  On: 08/03/2022 11:52   DG Chest Portable 1 View  Result Date: 08/02/2022 CLINICAL DATA:  Weakness and breast cancer.  Dehydration.  Fatigue. EXAM: PORTABLE CHEST 1 VIEW COMPARISON:  Chest CT 06/25/2022 FINDINGS: Increased right pleural effusion with complete opacification of the right hemithorax. There is leftward shift of the trachea and mediastinal structures. No aerated right lung is seen. No focal airspace disease in the left lung. Left lung nodules on prior CT are not seen by radiograph. No visible pneumothorax. Osseous metastatic disease on CT not well demonstrated. IMPRESSION: Increased right pleural effusion with complete opacification of the right hemithorax. No aerated right lung is seen. Leftward shift of the trachea and mediastinal structures. Electronically Signed   By: Keith Rake M.D.   On: 08/02/2022 16:53

## 2022-08-20 NOTE — Progress Notes (Signed)
NAME:  [name], MRN:  [id number], DOB:  [birth year], LOS: 36 ADMISSION DATE:  08/02/2022, CONSULTATION DATE:  08/05/22 REFERRING MD:  Gershon Cull , CHIEF COMPLAINT:   Lethargy, ams   BRIEF  61 year old with metastatic breast cancer, hypertension with recurrent right-sided malignant effusion presenting with decreased appetite, weakness and lethargy.  She underwent thoracentesis on admission showing MSSA empyema.  She underwent emergent thoracentesis 11/8.  Required vasopressors, weaned off.   Pertinent  Medical History  HTN Breast cancer - R chest wall, contralateral breast (L. Mets to lung and bones.  Responding well to chemo/antiestrogen.   Failutre to thrive, weight loss, malignant cachexia  Malignant pleural effusion R   Home meds: abemaciclib, arimidex, calcium carbonate, vit D, Dilaudid '8mg'$  q6 prn  Mag oxide, mirtazapine 7.'5mg'$  qhs  Miralax, senna  Flagyl gel   Significant Hospital Events: Including procedures, antibiotic start and stop dates in addition to other pertinent events   03/23/2022: Right-sided thoracentesis: LDH 259.  White cells 207.  8% polymorphs. Malignant cells consistent with breast cancer present 08/02/2022 - admit MRSA PCR neg 08/03/2022: Right thoracentesis 800 mL and stopped due to pain LDH 439, white cell 7100/84% polymorphs > huge increase in white count and left shift. MSSA gorwing from pleural fluids 08/03/22 08/05/22 -PCCM consult and ICU tx Left groin CVL Left groin a-line  08/05/22 -last Narcan was at 4 AM.  Now completely awake on BiPAP.  Wants BiPAP off.  No respiratory distress.  Remains on vasopressors Levophed 9 mcg and vasopressin.  Diastolic is low at 40 with map of 71.  She has left femoral arterial line and central line.  She is oriented and communicating.  She remains afebrile.  White count is high.  She is on antibiotics vancomycin and cefepime. ECHO - ef65%. RSVP 45+., Mild to mod TR. Mild MR Family not in favor for pleurx. Dr Alvy Bimler  recommending 1 more thora 08/06/22 worsening effusion with ?tension physiology s/p emergent thora. Repeat pleural Cx showing staph 11/10 after extensive patient with family informed decision made for right pigtail catheter by IR 11/12 CT drainage 1070. S/p tPA, dornase 11/13 CT drainage 1L, s/p tPA, dornase 11/15 tpa, dornase for decreased chest tube output 11/20 CT drainage 450 cc 11/21 CT drainage 290 cc 11/22 Chest tube removed  Interim History / Subjective:   Since placing chest tube on waterseal 200 cc straw colored output  Objective   Blood pressure 103/82, pulse (!) 107, temperature 98.9 F (37.2 C), temperature source Oral, resp. rate 16, height '5\' 6"'$  (1.676 m), weight 68.9 kg, SpO2 100 %.        Intake/Output Summary (Last 24 hours) at 08/20/2022 1035 Last data filed at 08/19/2022 1900 Gross per 24 hour  Intake 614 ml  Output 760 ml  Net -146 ml   Filed Weights   08/12/22 0500 08/15/22 0500 08/18/22 0500  Weight: 59.5 kg 59.2 kg 68.9 kg    Physical Exam: General: Chronically ill-appearing, no acute distress HENT: [place], AT, OP clear, MMM Eyes: EOMI, no scleral icterus Respiratory:  Diminished bibasilar breath sounds to auscultation bilaterally.  No crackles, wheezing or rales. Right chest tube in place. Cardiovascular: RRR, -M/R/G, no JVD GI: BS+, soft, nontender Extremities:-Edema,-tenderness Neuro: AAO x4, CNII-XII grossly intact  CXR 08/18/22 Increased right lateral densities, Right hydropneumothorax unchanged. Unchanged left pleural effusion CXR 08/18/22 Right pleural effusion, likely hydropneumothorax CXR 08/20/22 - stable bibasilar effusions. Right chest tube in place  Resolved Hospital Problem list    MSSA empyema  Acute respiratory failure with hypoxia due to bilateral pleural effusion Malignant right pleural effusion  Trapped lung on the R w/ hydropneumothorax - chronic when compared to imaging since June 2023. - S/p tPA and dornase x 3 with good  response.  Improved but continued high chest tube output likely related to malignant pleural effusion and poor nutritional status. - Afebrile >72 hours and mildly elevated but improving leukocytosis. No O2 requirement. Plan: - Chest tube removed - CXR ordered this afternoon and tomorrow am - Recommend 6 week course of ABX (end date 12/20) - ancef while inpatient, consider transition to augmentin as nears discharge  - Previously discussed this week with patient and sister the risks and benefits of PleurX catheter. She will likely need device and is aware of risk of infection in setting of treating empyema. However our options are limited if she has rapid fluid accumulation.  - Sister expresses concern regarding overall plan for management of pleural effusion (chemotherapy) and ensuring discharge plan to SNF that would accept PleurX catheter as patient does not have social support to perform drainage at home. I have contacted primary team regarding this.  Metastatic breast cancer -mgmt per Dr. Alvy Bimler. Discussed with team regarding family concerns about overall treatment. -Need to continue to focus aggressively on nutrition and rehabilitation  Goals of Care -DNR after discussion with Dr. Alvy Bimler  -ongoing discussions -prognosis remains guarded with co-morbidities, poor nutrition, advanced cancer  Pulmonary will continue to follow   Best Practice (right click and "Reselect all SmartList Selections" daily)   Per [facility]   Signature:  Care Time: 50 min  Lashaundra Lehrmann Rodman Pickle 08/20/22 10:35 AM [facility]

## 2022-08-21 ENCOUNTER — Inpatient Hospital Stay (HOSPITAL_COMMUNITY): Payer: BC Managed Care – PPO

## 2022-08-21 DIAGNOSIS — A4901 Methicillin susceptible Staphylococcus aureus infection, unspecified site: Secondary | ICD-10-CM | POA: Diagnosis not present

## 2022-08-21 DIAGNOSIS — J869 Pyothorax without fistula: Secondary | ICD-10-CM | POA: Diagnosis not present

## 2022-08-21 DIAGNOSIS — C50911 Malignant neoplasm of unspecified site of right female breast: Secondary | ICD-10-CM | POA: Diagnosis not present

## 2022-08-21 DIAGNOSIS — J90 Pleural effusion, not elsewhere classified: Secondary | ICD-10-CM | POA: Diagnosis not present

## 2022-08-21 NOTE — Progress Notes (Signed)
NAME:  [name], MRN:  [id number], DOB:  1961-01-23, LOS: 4 ADMISSION DATE:  08/02/2022, CONSULTATION DATE:  08/05/22 REFERRING MD:  Gershon Cull , CHIEF COMPLAINT:   Lethargy, ams   BRIEF  61 year old with metastatic breast cancer, hypertension with recurrent right-sided malignant effusion presenting with decreased appetite, weakness and lethargy.  She underwent thoracentesis on admission showing MSSA empyema.  She underwent emergent thoracentesis 11/8.  Required vasopressors, weaned off.   Pertinent  Medical History  HTN Breast cancer - R chest wall, contralateral breast (L. Mets to lung and bones.  Responding well to chemo/antiestrogen.   Failutre to thrive, weight loss, malignant cachexia  Malignant pleural effusion R   Home meds: abemaciclib, arimidex, calcium carbonate, vit D, Dilaudid '8mg'$  q6 prn  Mag oxide, mirtazapine 7.'5mg'$  qhs  Miralax, senna  Flagyl gel   Significant Hospital Events: Including procedures, antibiotic start and stop dates in addition to other pertinent events   03/23/2022: Right-sided thoracentesis: LDH 259.  White cells 207.  8% polymorphs. Malignant cells consistent with breast cancer present 08/02/2022 - admit MRSA PCR neg 08/03/2022: Right thoracentesis 800 mL and stopped due to pain LDH 439, white cell 7100/84% polymorphs > huge increase in white count and left shift. MSSA gorwing from pleural fluids 08/03/22 08/05/22 -PCCM consult and ICU tx Left groin CVL Left groin a-line  08/05/22 -last Narcan was at 4 AM.  Now completely awake on BiPAP.  Wants BiPAP off.  No respiratory distress.  Remains on vasopressors Levophed 9 mcg and vasopressin.  Diastolic is low at 40 with map of 71.  She has left femoral arterial line and central line.  She is oriented and communicating.  She remains afebrile.  White count is high.  She is on antibiotics vancomycin and cefepime. ECHO - ef65%. RSVP 45+., Mild to mod TR. Mild MR Family not in favor for pleurx. Dr Alvy Bimler  recommending 1 more thora 08/06/22 worsening effusion with ?tension physiology s/p emergent thora. Repeat pleural Cx showing staph 11/10 after extensive patient with family informed decision made for right pigtail catheter by IR 11/12 CT drainage 1070. S/p tPA, dornase 11/13 CT drainage 1L, s/p tPA, dornase 11/15 tpa, dornase for decreased chest tube output 11/20 CT drainage 450 cc 11/21 CT drainage 290 cc 11/22 Chest tube removed  Interim History / Subjective:   Since placing chest tube on waterseal 200 cc straw colored output  Objective   Blood pressure 109/81, pulse (!) 102, temperature 98.4 F (36.9 C), temperature source Oral, resp. rate 20, height '5\' 6"'$  (1.676 m), weight 65.5 kg, SpO2 98 %.        Intake/Output Summary (Last 24 hours) at 08/21/2022 0929 Last data filed at 08/21/2022 0516 Gross per 24 hour  Intake 240 ml  Output 870 ml  Net -630 ml   Filed Weights   08/15/22 0500 08/18/22 0500 08/21/22 0500  Weight: 59.2 kg 68.9 kg 65.5 kg    Physical Exam: General: Chronically ill-appearing, no acute distress HENT: [place], AT, OP clear, MMM Eyes: EOMI, no scleral icterus Respiratory:  Diminished bibasilar breath sounds to auscultation bilaterally.  No crackles, wheezing or rales. Right chest tube in place. Cardiovascular: RRR, -M/R/G, no JVD GI: BS+, soft, nontender Extremities:-Edema,-tenderness Neuro: AAO x4, CNII-XII grossly intact  CXR 08/18/22 Increased right lateral densities, Right hydropneumothorax unchanged. Unchanged left pleural effusion CXR 08/18/22 Right pleural effusion, likely hydropneumothorax CXR 08/20/22 - stable bibasilar effusions. Right chest tube in place  Resolved Hospital Problem list    MSSA empyema  Acute respiratory failure with hypoxia due to bilateral pleural effusion Malignant right pleural effusion  Trapped lung on the R w/ hydropneumothorax - chronic when compared to imaging since June 2023. - S/p tPA and dornase x 3 with good  response.  Improved but continued high chest tube output likely related to malignant pleural effusion and poor nutritional status. - Chest tube 11/10-11/22 - CXR 11/23 stable effusion with right hydropneumothorax Plan: - CXR tomorrow - Recommend 6 week course of ABX (end date 12/20) - ancef while inpatient, consider transition to augmentin as nears discharge  - Family discussion with patient and mom at bedside today with Oncology and Pulmonary team. If hydropneumothorax is stable for the remainder of this hospitalization, no additional pleural intervention needed. Will need to arrange pulmonary follow-up in 1 month closer to discharge to assess patient after antibiotic completion (ok to see Dr. Loanne Drilling).  - In the setting of active infection and known skin-related changes from cancer treatment, prefer thoracentesis if malignant effusion worsens. PleurX catheter would only be considered if effusion is recurrent and rapidly accumulating however all parties aware this is not ideal in the setting of empyema given the risk of infection as well as the lack of social support to manage device at home or facilities.  Metastatic breast cancer -mgmt per Dr. Alvy Bimler. Discussed with team regarding family concerns about overall treatment. -Need to continue to focus aggressively on current infection, nutrition and rehabilitation  Goals of Care -DNR after discussion with Dr. Alvy Bimler  -ongoing discussions -prognosis remains guarded with co-morbidities, poor nutrition, advanced cancer  Pulmonary will continue to follow   Best Practice (right click and "Reselect all SmartList Selections" daily)   Per TR[facility]   Signature:  Care Time: 50 min  Stephon Weathers Rodman Pickle 08/21/22 9:29 AM [facility]

## 2022-08-21 NOTE — Progress Notes (Signed)
Progress Note    [name]   UEK:[id number]  DOB: Jun 28, 1961  DOA: 08/02/2022     17 PCP: Heath Lark, MD  Initial CC: Fatigue, weakness  Hospital Course: [name] is a 61 year old female with PMH right breast cancer, chronic right pleural malignant effusion, chronic cancer related pain, malnutrition who presented from home with complaints of fatigue, generalized weakness, loss of appetite.   Chest x-ray concerning for increased right pleural effusion with complete opacification of the right hemithorax.  Important events: 11/4-admit to the hospital. 11/5-right-sided thoracentesis 800 mL removed, LDH 439, white cell 84%.  MSSA from the pleural fluid. 11/7-PCCM consulted and transferred to ICU, started on vasopressors with left groin central line.  Treated with BiPAP. 11/8-worsening pleural effusion, emergent thoracentesis done persistent staph 11/10-pigtail catheter was placed and currently getting Lasix through the tube. 11/12 transfer to [facility].   Remains on pigtail catheter with tPA.   Remains in the hospital, chest tube management, IV antibiotics.  Frailty and debility. Chest tube is actively draining. 11/22: Chest tube removed  Interval History:  No events overnight.  Mother present this morning as well. They all had a family meeting with oncology and pulmonology this morning.  They had no further questions or needs during my rounding this morning.   Assessment and Plan:  Recurrent right-sided pleural effusion - Secondary to breast cancer.  On presentation,Chest x-ray showed complete opacification of right hemithorax, Leftward shift of the trachea and mediastinal structures.  - s/p chest tube placement. Not a candidate for VATS.  PCCM team following.   - chest tube removed 11/22 - 6 week abx course recommended per pulmonology; end date 12/20; will d/c on Augmentin at discharge; continue ancef for now   Septic shock - resolved MSSA empyema Continue current  antibiotics.  Sepsis physiology has improved.  Fluid culture showed MSSA.  Repeat cultures on 11/14 with no growth  Physical deconditioning  -Overall deconditioning in setting of underlying malignancy and poor nutritional status - Evaluated by PT, recommendation is for SNF - Awaiting placement  Transient A-fib: Likely secondary to respiratory distress from empyema. CHA2DS2VAsc of 1   Breast cancer: Followed by Dr. Alvy Bimler.  On Verzenio and Arimidex at home. Continue anastrozole for now.   Chronic cancer rated pain: Continue oral Dilaudid as needed   Protein calorie malnutrition: On Remeron ,megace - RD following   Normocytic anemia: Currently hemoglobin stable.  Most likely associated with malignancy     Old records reviewed in assessment of this patient   DVT prophylaxis:  enoxaparin (LOVENOX) injection 40 mg Start: 08/06/22 2200 Place and maintain sequential compression device Start: 08/06/22 1943   Code Status:   Code Status: DNR  Mobility Assessment (last 72 hours)     Mobility Assessment     Row Name 08/21/22 0746 08/20/22 1000 08/19/22 2100 08/19/22 0803     Does patient have an order for bedrest or is patient medically unstable No - Continue assessment No - Continue assessment No - Continue assessment No - Continue assessment    What is the highest level of mobility based on the progressive mobility assessment? Level 4 (Walks with assist in room) - Balance while marching in place and cannot step forward and back - Complete Level 4 (Walks with assist in room) - Balance while marching in place and cannot step forward and back - Complete -- Level 4 (Walks with assist in room) - Balance while marching in place and cannot step forward and back - Complete  Is the above level different from baseline mobility prior to current illness? Yes - Recommend PT order Yes - Recommend PT order -- Yes - Recommend PT order             Barriers to discharge:  Disposition Plan:   SNF Status is: Inpt  Objective: Blood pressure 116/67, pulse (!) 105, temperature 98.5 F (36.9 C), temperature source Oral, resp. rate 18, height '5\' 6"'$  (1.676 m), weight 65.5 kg, SpO2 97 %.  Examination:  Physical Exam Constitutional:      Appearance: Normal appearance.  HENT:     Head: Normocephalic and atraumatic.     Mouth/Throat:     Mouth: Mucous membranes are moist.  Eyes:     Extraocular Movements: Extraocular movements intact.  Cardiovascular:     Rate and Rhythm: Normal rate and regular rhythm.  Pulmonary:     Comments: Coarse breath sounds Abdominal:     General: Bowel sounds are normal. There is no distension.     Palpations: Abdomen is soft.     Tenderness: There is no abdominal tenderness.  Musculoskeletal:        General: Normal range of motion.     Cervical back: Normal range of motion.  Skin:    General: Skin is warm.  Neurological:     General: No focal deficit present.     Mental Status: She is alert.  Psychiatric:        Mood and Affect: Mood normal.      Consultants:  PCCM Oncology   Procedures:    Data Reviewed: No results found for this or any previous visit (from the past 24 hour(s)).   I have Reviewed nursing notes, Vitals, and Lab results since pt's last encounter. Pertinent lab results : see above I have ordered test including BMP, CBC, Mg I have reviewed the last note from staff over past 24 hours I have discussed pt's care plan and test results with nursing staff, case manager  Time spent: Greater than 50% of the 55 minute visit was spent in counseling/coordination of care for the patient as laid out in the A&P.    LOS: 17 days   Dwyane Dee, MD [facility] 08/21/2022, 1:52 PM

## 2022-08-21 NOTE — Progress Notes (Signed)
[name]   DOB:[birth year]   GS#:[id number]    ASSESSMENT & PLAN:  Metastatic breast cancer to the lung and bones While she responded very well to chemotherapy and antiestrogen therapy, she is not thriving due to progressive weight loss and weakness While hospitalized, I recommend we continue on antiestrogen therapy but hold abemaciclib Recent CT imaging showed continuous improvement in response to therapy despite being off abemaciclib She will continue anastrozole I spent a lot of time explaining this to her mother and patient why chemotherapy (abemaciclib was stopped) I do not recommend abemaciclib while she is on antibiotics   Recurrent malignant pleural effusion with MSSA empyema I would defer management to pulmonologist/ICU team She would likely be on several weeks of antibiotics Appreciate recommendation from pulmonologist Continue close observation with serial x-ray  malignant cachexia Failure to thrive Severe protein calorie malnutrition I encouraged the patient to continue frequent small meals Reviewed documentation from dietitian.  She had average approximately 50% of caloric requirement. This is one of the main causes of delayed healing   Cancer associated pain She had recent confusion episode/altered mental status due to narcotics Her pain control has improved when her pain medicine is on a scheduled basis I do not recommend schedule long acting narcotics due to her frail status and propensity to get confused   Generalized weakness Failure to thrive Multifactorial, related to progressive weight loss, muscle loss and deconditioning Recommend skilled nursing facility placement upon discharge I will defer this to care management team/social worker to arrange. Her mother expressed understanding   Code Status With a progressive decline in empyema, I reviewed the CODE STATUS with the patient twice on November 9 Ultimately, she agreed with changing her CODE STATUS to DO  NOT RESUSCITATE I discussed this with [name]'s mother today   Goals of care Improvement of weakness, infection and nutrition status   Discharge planning Unknown at this point I have completed family meeting for over 45 minutes today in her room and addressed all their questions and concerns I will check on her again next week  Heath Lark, MD 08/21/2022 8:39 AM  Subjective:  She is doing well.  Her pain is reasonably controlled.  I spent over 45 minutes reviewing plan of care with her and her mother along with Dr. Cordelia Pen presence   Objective:  Vitals:   08/20/22 2101 08/21/22 0512  BP: 114/76 109/81  Pulse: (!) 106 (!) 102  Resp: 20 20  Temp: 98.2 F (36.8 C) 98.4 F (36.9 C)  SpO2: 98% 98%     Intake/Output Summary (Last 24 hours) at 08/21/2022 0839 Last data filed at 08/21/2022 0516 Gross per 24 hour  Intake 240 ml  Output 870 ml  Net -630 ml    GENERAL:alert, no distress and comfortable  NEURO: alert & oriented x 3 with fluent speech, no focal motor/sensory deficits   Labs:  Recent Labs    08/05/22 0458 08/06/22 0456 08/06/22 1549 08/07/22 0428 08/08/22 0500 08/09/22 1113 08/15/22 1439 08/17/22 1305 08/20/22 0447  NA 136 136  --  137 138   < > 141 142 144  K 4.0 4.2  --  4.2 4.2   < > 4.0 3.9 3.6  CL 103 102  --  105 104   < > 109 109 109  CO2 25 28  --  28 29   < > '29 24 30  '$ GLUCOSE 129* 124*  --  135* 135*   < > 184* 107* 87  BUN 33*  35*  --  30* 25*   < > 24* 22 18  CREATININE 1.85* 1.38*  --  1.08* 0.80   < > 0.67 0.61 0.43*  CALCIUM 7.1* 8.0*  --  7.7* 7.8*   < > 7.0* 7.4* 7.9*  GFRNONAA 30* 43*  --  58* >60   < > >60 >60 >60  PROT 6.3* 6.0* 6.1* 6.0*  --   --   --   --   --   ALBUMIN 2.4* 2.3*  --  2.2* 2.1*  --   --   --   --   AST 22 18  --  20  --   --   --   --   --   ALT 23 21  --  20  --   --   --   --   --   ALKPHOS 50 49  --  49  --   --   --   --   --   BILITOT 0.3 0.4  --  0.3  --   --   --   --   --    < > = values in this  interval not displayed.    Studies:  DG CHEST PORT 1 VIEW  Result Date: 08/20/2022 CLINICAL DATA:  Encounter for chest tube removal. EXAM: PORTABLE CHEST 1 VIEW COMPARISON:  Chest radiograph 08/20/2022 at 4:20 a.m. FINDINGS: The patient remains rotated to the right with unchanged cardiomediastinal silhouette. The right-sided chest tube has been removed. No definite pneumothorax is identified. Bilateral pleural effusions and bibasilar atelectasis or consolidation are unchanged. IMPRESSION: 1. Interval right chest tube removal. No definite pneumothorax. 2. Unchanged pleural effusions and bibasilar atelectasis or consolidation. Electronically Signed   By: Logan Bores M.D.   On: 08/20/2022 11:12   DG CHEST PORT 1 VIEW  Result Date: 08/20/2022 CLINICAL DATA:  Pleural effusion. EXAM: PORTABLE CHEST 1 VIEW COMPARISON:  August 19, 2022. FINDINGS: Stable cardiomediastinal silhouette. Stable position of right-sided chest tube. No definite pneumothorax is noted currently. Stable bibasilar atelectasis and effusions are noted. Bony thorax is unremarkable. IMPRESSION: Stable position of right-sided chest tube. Stable bibasilar atelectasis and effusions. Electronically Signed   By: Marijo Conception M.D.   On: 08/20/2022 08:11   DG Chest 2 View  Result Date: 08/19/2022 CLINICAL DATA:  Pleural effusion, chest tube EXAM: CHEST - 2 VIEW COMPARISON:  08/18/2022 FINDINGS: Pigtail RIGHT thoracostomy tube again seen. Normal heart size and mediastinal contours. Slight rotation to the RIGHT. Bibasilar effusions and atelectasis. Tiny pneumothorax at RIGHT apex. No acute infiltrate or significant bony findings. IMPRESSION: BILATERAL pleural effusions and basilar atelectasis. Tiny RIGHT pneumothorax despite thoracostomy tube. Electronically Signed   By: Lavonia Dana M.D.   On: 08/19/2022 14:48   DG CHEST PORT 1 VIEW  Result Date: 08/18/2022 CLINICAL DATA:  Pleural effusion. EXAM: PORTABLE CHEST 1 VIEW COMPARISON:  Chest  radiograph 08/17/2022 FINDINGS: Right basilar chest tube is stable in position. Patient is rotated towards the right which limits evaluation of this examination. Patient had a small right apical pneumothorax on the previous examination which is probably unchanged but poorly characterized. Increased densities near the lateral right lung base could be related to overlying shadows but cannot exclude re-accumulation of pleural fluid. Persistent small to moderate sized left pleural effusion. Patchy parenchymal densities in both lungs compatible with atelectasis and difficult to exclude mild edema. Heart size is stable. IMPRESSION: 1. Limited examination due to patient rotation. 2. Right chest  tube is stable in position. Patient had a small right apical pneumothorax on the previous examination which is probably unchanged but poorly characterized. 3. Increased densities near the lateral right lung base could be related to overlying shadows but cannot exclude re-accumulation of pleural fluid. 4. Persistent small to moderate sized left pleural effusion. Electronically Signed   By: Markus Daft M.D.   On: 08/18/2022 08:19   DG CHEST PORT 1 VIEW  Result Date: 08/17/2022 CLINICAL DATA:  Follow-up pleural effusions. EXAM: PORTABLE CHEST 1 VIEW COMPARISON:  08/15/2022 FINDINGS: Right pleural pigtail catheter remains in place. Minimal residual pleural fluid is noted, as well as a 5% right apical pneumothorax. Small left pleural effusion and left basilar atelectasis shows no significant change. Cardiomegaly remains stable. IMPRESSION: Minimal residual right pleural fluid and 5% right apical pneumothorax. Right pleural pigtail catheter remains in place. Stable small left pleural effusion and left basilar atelectasis. Electronically Signed   By: Marlaine Hind M.D.   On: 08/17/2022 14:12   VAS [place] LOWER EXTREMITY VENOUS (DVT)  Result Date: 08/15/2022  Lower Venous DVT Study Patient Name:  ANNIKAH LOVINS  Date of Exam:    08/14/2022 Medical Rec #: [id number]        Accession #:    [id number] Date of Birth: 1961/09/19         Patient Gender: F Patient Age:   26 years Exam Location:  [facility] Procedure:      VAS [place] LOWER EXTREMITY VENOUS (DVT) Referring Phys: Noemi Chapel --------------------------------------------------------------------------------  Indications: Edema.  Risk Factors: None identified. Limitations: Poor ultrasound/tissue interface. Comparison Study: No prior studies. Performing Technologist: Oliver Hum RVT  Examination Guidelines: A complete evaluation includes B-mode imaging, spectral Doppler, color Doppler, and power Doppler as needed of all accessible portions of each vessel. Bilateral testing is considered an integral part of a complete examination. Limited examinations for reoccurring indications may be performed as noted. The reflux portion of the exam is performed with the patient in reverse Trendelenburg.  +---------+---------------+---------+-----------+----------+--------------+ RIGHT    CompressibilityPhasicitySpontaneityPropertiesThrombus Aging +---------+---------------+---------+-----------+----------+--------------+ CFV      Full           Yes      Yes                                 +---------+---------------+---------+-----------+----------+--------------+ SFJ      Full                                                        +---------+---------------+---------+-----------+----------+--------------+ FV Prox  Full                                                        +---------+---------------+---------+-----------+----------+--------------+ FV Mid   Full                                                        +---------+---------------+---------+-----------+----------+--------------+ FV DistalFull                                                        +---------+---------------+---------+-----------+----------+--------------+  PFV      Full                                                         +---------+---------------+---------+-----------+----------+--------------+ POP      Full           Yes      Yes                                 +---------+---------------+---------+-----------+----------+--------------+ PTV      Full                                                        +---------+---------------+---------+-----------+----------+--------------+ PERO     Full                                                        +---------+---------------+---------+-----------+----------+--------------+   +---------+---------------+---------+-----------+----------+--------------+ LEFT     CompressibilityPhasicitySpontaneityPropertiesThrombus Aging +---------+---------------+---------+-----------+----------+--------------+ CFV      Full           Yes      Yes                                 +---------+---------------+---------+-----------+----------+--------------+ SFJ      Full                                                        +---------+---------------+---------+-----------+----------+--------------+ FV Prox  Full                                                        +---------+---------------+---------+-----------+----------+--------------+ FV Mid   Full                                                        +---------+---------------+---------+-----------+----------+--------------+ FV DistalFull                                                        +---------+---------------+---------+-----------+----------+--------------+ PFV      Full                                                        +---------+---------------+---------+-----------+----------+--------------+  POP      Full           Yes      Yes                                 +---------+---------------+---------+-----------+----------+--------------+ PTV      Full                                                         +---------+---------------+---------+-----------+----------+--------------+ PERO     Full                                                        +---------+---------------+---------+-----------+----------+--------------+     Summary: RIGHT: - There is no evidence of deep vein thrombosis in the lower extremity.  - No cystic structure found in the popliteal fossa.  LEFT: - There is no evidence of deep vein thrombosis in the lower extremity.  - No cystic structure found in the popliteal fossa.  *See table(s) above for measurements and observations. Electronically signed by Deitra Mayo MD on 08/15/2022 at 3:02:04 PM.    Final    DG CHEST PORT 1 VIEW  Result Date: 08/15/2022 CLINICAL DATA:  [id number] Pleural effusion [id number] EXAM: PORTABLE CHEST 1 VIEW COMPARISON:  August 14, 2022 FINDINGS: The cardiomediastinal silhouette is unchanged in contour.RIGHT-sided chest tube. RIGHT-sided hydropneumothorax with a mildly decreased fluid component in comparison to prior thereby mildly increased conspicuity of the air component. Moderate LEFT pleural effusion. Bibasilar opacities, similar in comparison to prior. Revisualization of a peripheral RIGHT rind of airspace opacities. IMPRESSION: 1. RIGHT-sided hydropneumothorax with mild decrease in the fluid component and increased conspicuity of the air component. There is likely a corresponding corticated RIGHT lung. 2. Moderate LEFT pleural effusion, similar in comparison to prior. Electronically Signed   By: Valentino Saxon M.D.   On: 08/15/2022 09:52   DG CHEST PORT 1 VIEW  Result Date: 08/14/2022 CLINICAL DATA:  Right chest tube, bilateral pleural effusions EXAM: PORTABLE CHEST 1 VIEW COMPARISON:  Previous studies including the examination of 08/13/2022 FINDINGS: Transverse diameter of heart is increased. Right chest tube is noted with its tip in the medial right lower lung field. Moderate bilateral pleural effusions are seen, more so on the left  side. There is interval decrease in right pleural effusion. Sharp appearance of right cardiac margin may suggest loculated pneumothorax in the right lower lung field with interval decrease. There is no demonstrable apical pneumothorax. IMPRESSION: Cardiomegaly. Bilateral pleural effusions, more so on the left side. There is interval decrease in right pleural effusion and possible decrease in loculated pneumothorax in right lower lung field. There is no demonstrable apical pneumothorax. Electronically Signed   By: Elmer Picker M.D.   On: 08/14/2022 08:21   DG CHEST PORT 1 VIEW  Result Date: 08/13/2022 CLINICAL DATA:  Pleural effusion EXAM: PORTABLE CHEST 1 VIEW COMPARISON:  CXR 08/12/22 FINDINGS: Right-sided pigtail pleural drainage catheter in place, unchanged in appearance from prior exam. Redemonstrated is a large right-sided hydropneumothorax which overall appears similar to prior exam with unchanged appearance of the air component  along the apical and lateral margins of the right lung and the fluid component layering at the right lung base. The appearance of the left lung is also unchanged from prior exam with a persistent small layering pleural effusion. There is no new focal airspace opacity. Cardiac and mediastinal contours are unchanged from prior exam. No new displaced rib fracture. Visualized upper abdomen is unremarkable. IMPRESSION: 1. No significant change in appearance of the large right-sided hydropneumothorax with a pigtail pleural drainage catheter in place. 2.  Unchanged layering moderate sized left pleural effusion. Electronically Signed   By: Marin Roberts M.D.   On: 08/13/2022 07:21   DG CHEST PORT 1 VIEW  Result Date: 08/12/2022 CLINICAL DATA:  [id number] with right hydropneumothorax, left pleural effusion. EXAM: PORTABLE CHEST 1 VIEW COMPARISON:  Portable chest yesterday at 7:28 a.m. FINDINGS: 4:29 a.m. Pigtail chest tube with tip in the medial right chest base is similar in  positioning. Moderate to large bilateral pleural effusions, on the left layering posteriorly without pneumothorax, on the right again with loculated hydropneumothorax and extrapleural air again noted laterally. The right hydropneumothorax is estimated to occupy much as 50% of the right chest volume with the hydro component predominating. There is overlying consolidation or atelectasis in the lower lung fields. The overall aeration pattern seems unchanged. There is cardiomegaly with mild central vascular prominence. No overt edema is seen. Stable mediastinum. Again noted is a destructive lesion of the lateral right fifth rib likely metastasis. The patient is rotated to the right. IMPRESSION: 1. No significant change in the appearance of the chest since yesterday's study. 2. The right hydropneumothorax is estimated to occupy much as 50% of the right chest volume with the hydro component predominating. 3. There is overlying consolidation or atelectasis in the lower lung fields with moderate-to-large layering left effusion. 4. Cardiomegaly with mild central vascular prominence. Electronically Signed   By: Telford Nab M.D.   On: 08/12/2022 06:03   DG Chest Port 1 View  Result Date: 08/11/2022 CLINICAL DATA:  Pleural effusion, metastatic breast carcinoma EXAM: PORTABLE CHEST 1 VIEW COMPARISON:  Previous studies including the examination of 08/10/2022 FINDINGS: Apparent shift of mediastinum to the right may be due to rotation. Transverse diameter of heart is increased. Moderate to large bilateral pleural effusions are seen. There is interval decrease in right pleural effusion. Single right chest tube is seen. Central pulmonary vessels are less prominent. Evaluation of right mid and both lower lung fields for infiltrates is limited by the effusions. There is no pneumothorax. IMPRESSION: Moderate to large bilateral pleural effusions. There is interval decrease in right pleural effusion. Electronically Signed   By:  Elmer Picker M.D.   On: 08/11/2022 09:29   CT CHEST WO CONTRAST  Result Date: 08/10/2022 CLINICAL DATA:  Metastatic breast cancer.  Empyema. * Tracking Code: BO * EXAM: CT CHEST WITHOUT CONTRAST TECHNIQUE: Multidetector CT imaging of the chest was performed following the standard protocol without IV contrast. RADIATION DOSE REDUCTION: This exam was performed according to the departmental dose-optimization program which includes automated exposure control, adjustment of the mA and/or kV according to patient size and/or use of iterative reconstruction technique. COMPARISON:  06/25/2022 FINDINGS: Cardiovascular: Mild atherosclerotic calcification of the aortic arch. Mediastinum/Nodes: There is hazy diffuse edema/infiltration of the mediastinal and subcutaneous adipose tissues compatible with third spacing of fluid. Lungs/Pleura: Large loculated right hydropneumothorax. Pigtail right pleural drainage catheter in place. IDA. Made only about 25% of the right lung is aerated. There is scattered atelectasis in  the right lung. Moderate to large left pleural effusion encompassing about 1/3 of the left hemithoracic volume. No definite mass or nodule in the aerated portion of the left lung. Upper Abdomen: Stable small hypodense hepatic lesions, compatible with cysts. Stable benign cyst in the left kidney upper pole, not requiring further imaging workup. Musculoskeletal: The CT scan from 06/25/2022 showed enhancing lesions in the right chest wall and axilla. These are substantially less conspicuous today although likely mainly from lack of IV contrast rather than resolution. A right axillary mass or lymph node measures 1.8 cm in short axis on image 52 series 2, formerly 1.9 cm. Bony destructive findings of the right fifth rib laterally as on image 62 series 5. Sclerotic focus possibly a healing fracture in the right eighth rib laterally. Extensive sclerosis in the sternum favoring metastatic disease over infection.  Nearly complete central collapse of the T3 vertebral body with associated sclerosis and minimal posterior bony retropulsion unchanged from prior. IMPRESSION: 1. Large loculated right hydropneumothorax encompassing about 75% of right hemithoracic volume. Pigtail right pleural drainage catheter in place. 2. Moderate to large left pleural effusion encompassing about 1/3 of the left hemithoracic volume. 3. Right axillary mass or lymph node measures 1.8 cm in short axis, formerly 1.9 cm. Other previously seen chest wall soft tissue lesions are less conspicuous on today's exam due to lack of IV contrast, but likely still present. 4. Bony destructive findings of the right fifth rib laterally compatible with metastatic disease. 5. Extensive sclerosis in the sternum favoring metastatic disease over infection. 6. Nearly complete central collapse of the T3 vertebral body with associated sclerosis and minimal posterior bony retropulsion, unchanged from prior. 7. Diffuse hazy edema/infiltration of the mediastinal and subcutaneous adipose tissues compatible with third spacing of fluid. 8. Aortic atherosclerosis. Aortic Atherosclerosis (ICD10-I70.0). Electronically Signed   By: Van Clines M.D.   On: 08/10/2022 10:39   DG Chest Port 1 View  Result Date: 08/10/2022 CLINICAL DATA:  Pleural effusion. Follow-up. Acute respiratory failure. EXAM: PORTABLE CHEST 1 VIEW COMPARISON:  08/08/2022 FINDINGS: There is a right-sided pigtail thoracostomy tube overlying the medial right lower lung. Right-sided, locule hydropneumothorax is identified. No significant change in volume compared with the previous exam. Moderate left pleural effusion appears increased in volume. IMPRESSION: 1. No significant change in volume of right-sided hydropneumothorax. 2. Increase in volume of moderate left pleural effusion. 3. Stable position of right thoracostomy tube. Electronically Signed   By: Kerby Moors M.D.   On: 08/10/2022 09:07   DG  Chest Port 1 View  Result Date: 08/09/2022 CLINICAL DATA:  Acute respiratory failure, right pleural effusion EXAM: PORTABLE CHEST 1 VIEW COMPARISON:  08/08/2022 FINDINGS: Interval placement of right basilar chest tube. Decreasing right pleural effusion. Moderate right effusion remains. No pneumothorax. Extensive airspace disease throughout the right lung. Left perihilar and lower lobe airspace disease with layering left effusion. Cardiomegaly. No acute bony abnormality. IMPRESSION: Interval placement of right chest tube with decreasing right pleural effusion and slight improved aeration within the right lung. Continued diffuse airspace disease on the right and in the left lower lung. Bilateral effusions. No pneumothorax. Electronically Signed   By: Rolm Baptise M.D.   On: 08/09/2022 08:12   CT [facility] PLEURAL DRAIN W/INDWELL CATH W/IMG GUIDE  Result Date: 08/08/2022 INDICATION: 61 year old woman with right empyema presents to IR for chest tube placement EXAM: CT-guided right chest tube placement TECHNIQUE: Multidetector CT imaging of the chest was performed following the standard protocol without IV contrast. RADIATION  DOSE REDUCTION: This exam was performed according to the departmental dose-optimization program which includes automated exposure control, adjustment of the mA and/or kV according to patient size and/or use of iterative reconstruction technique. MEDICATIONS: The patient is currently admitted to the hospital and receiving intravenous antibiotics. The antibiotics were administered within an appropriate time frame prior to the initiation of the procedure. ANESTHESIA/SEDATION: Moderate (conscious) sedation was employed during this procedure. A total of Versed 2 mg and Fentanyl 100 mcg was administered intravenously by the radiology nurse. Total intra-service moderate Sedation Time: 14 minutes. The patient's level of consciousness and vital signs were monitored continuously by radiology nursing  throughout the procedure under my direct supervision. COMPLICATIONS: None immediate. PROCEDURE: Informed written consent was obtained from the patient after a thorough discussion of the procedural risks, benefits and alternatives. All questions were addressed. Maximal Sterile Barrier Technique was utilized including caps, mask, sterile gowns, sterile gloves, sterile drape, hand hygiene and skin antiseptic. A timeout was performed prior to the initiation of the procedure. Patient positioned left lateral decubitus on the procedure table. The right lateral chest wall skin prepped and draped in usual fashion. Following local lidocaine administration, right pleural space was accessed with 19 gauge Yueh needle utilizing CT guidance. Yueh catheter removed over 0.035 inch Amplatz guidewire, serial dilation performed, and 14 [place] multipurpose pigtail drain inserted. CT confirmed appropriate positioning of the drain within the right pleural space. The drain was connected to Pleur-Evac and secured to skin with suture. IMPRESSION: CT-guided right chest tube (14 [place]) as above. Electronically Signed   By: Miachel Roux M.D.   On: 08/08/2022 17:02   DG Chest Port 1 View  Result Date: 08/08/2022 CLINICAL DATA:  Follow-up pleural effusion. EXAM: PORTABLE CHEST 1 VIEW COMPARISON:  08/07/2022 FINDINGS: Exam detail is diminished due to patient positioning with significant rightward rotational artifact. The cardiomediastinal contours appear unchanged. There is a large right pleural effusion which is unchanged in volume compared with the previous exam. There is significantly diminished aeration to the right lung with only a small amount of aerated lung in the right upper lobe. Small left pleural effusion is unchanged. IMPRESSION: 1. No change in large right pleural effusion with significantly diminished aeration to the right lung. Electronically Signed   By: Kerby Moors M.D.   On: 08/08/2022 07:05   DG Chest Port 1  View  Result Date: 08/07/2022 CLINICAL DATA:  Pleural effusion EXAM: PORTABLE CHEST 1 VIEW COMPARISON:  08/06/2022 FINDINGS: No significant change in rotated AP portable examination. Large right pleural effusion with near total atelectasis or consolidation of the right lung, as well as a small, layering left pleural effusion. Cardiomegaly, the cardiac borders largely obscured. IMPRESSION: No significant change in rotated AP portable examination. Large right pleural effusion with near total atelectasis or consolidation of the right lung, as well as a small, layering left pleural effusion. Electronically Signed   By: Delanna Ahmadi M.D.   On: 08/07/2022 09:17   DG Chest Port 1 View  Result Date: 08/06/2022 CLINICAL DATA:  S/P thoracentesis EXAM: PORTABLE CHEST 1 VIEW COMPARISON:  Same day chest x-ray. FINDINGS: Decreased but still large right pleural effusion. Small left pleural effusion. Bilateral overlying opacities. No visible pneumothorax on this semi erect radiograph. Cardiomediastinal silhouette is similar. Polyarticular degenerative change. IMPRESSION: 1. Decreased but still large right pleural effusion. No visible pneumothorax on this semi erect radiograph. 2. Small left pleural effusion 3. Bilateral overlying opacities. Electronically Signed   By: Jamesetta So.D.  On: 08/06/2022 15:50   DG Chest 1 View  Result Date: 08/06/2022 CLINICAL DATA:  Respiratory failure. EXAM: CHEST  1 VIEW COMPARISON:  Multiple recent chest x-rays. FINDINGS: Persistent complete opacification of the right hemithorax due to a large pleural effusion. Slight shift of the heart and mediastinum to the left exaggerated by the rotation of the patient and thoracic scoliosis. Persistent left lower lobe atelectasis but no left pleural effusion. IMPRESSION: Persistent complete opacification of the right hemithorax. Electronically Signed   By: Marijo Sanes M.D.   On: 08/06/2022 11:48   DG Chest Port 1 View  Result Date:  08/05/2022 CLINICAL DATA:  Shortness of breath EXAM: PORTABLE CHEST 1 VIEW COMPARISON:  08/05/2022, 06/25/2022, 08/03/2022 FINDINGS: Complete opacification of right thorax without change. Subsegmental atelectasis left base. Partially obscured cardiomediastinal silhouette. IMPRESSION: Complete opacification of the right thorax without change, probably due to sizable pleural effusion. Subsegmental atelectasis left base Electronically Signed   By: Donavan Foil M.D.   On: 08/05/2022 23:33   ECHOCARDIOGRAM COMPLETE  Result Date: 08/05/2022    ECHOCARDIOGRAM REPORT   Patient Name:   KELYN PONCIANO Date of Exam: 08/05/2022 Medical Rec #:  [id number]       Height:       66.0 in Accession #:    [id number]      Weight:       118.6 lb Date of Birth:  11-22-1960        BSA:          1.602 m Patient Age:    77 years        BP:           97/44 mmHg Patient Gender: F               HR:           78 bpm. Exam Location:  Inpatient Procedure: 2D Echo, Cardiac Doppler and Color Doppler Indications:    R94.31 Abnormal EKG  History:        Patient has no prior history of Echocardiogram examinations.                 Risk Factors:Hypertension. Metastatic breast cancer.  Sonographer:    [place] Referring Phys: [id number] AMRIT ADHIKARI  Sonographer Comments: Technically difficult study due to poor echo windows, suboptimal apical window and suboptimal subcostal window. Apical images off axis due to the hardened texture of the left breast. Could not turn patient. Wound dressings in subcostal region. IMPRESSIONS  1. Left ventricular ejection fraction, by estimation, is 55 to 60%. The left ventricle has normal function. The left ventricle has no regional wall motion abnormalities. Left ventricular diastolic parameters were normal.  2. Right ventricular systolic function is normal. The right ventricular size is normal. Mildly increased right ventricular wall thickness. There is mildly elevated pulmonary artery systolic pressure. The  estimated right ventricular systolic pressure is 30.8 mmHg.  3. Left atrial size was mildly dilated.  4. A small pericardial effusion is present. The pericardial effusion is circumferential. There is no evidence of cardiac tamponade. Large pleural effusion in the right lateral region.  5. 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 Tricuspid valve regurgitation is mild to moderate.  7. The aortic valve is tricuspid. Aortic valve regurgitation is not visualized. No aortic stenosis is present.  8. The inferior vena cava is normal  in size with greater than 50% respiratory variability, suggesting right atrial pressure of 3 mmHg. FINDINGS  Left Ventricle: Left ventricular ejection fraction, by estimation, is 55 to 60%. The left ventricle has normal function. The left ventricle has no regional wall motion abnormalities. The left ventricular internal cavity size was normal in size. There is  no left ventricular hypertrophy. Left ventricular diastolic parameters were normal. Right Ventricle: The right ventricular size is normal. Mildly increased right ventricular wall thickness. Right ventricular systolic function is normal. There is mildly elevated pulmonary artery systolic pressure. The tricuspid regurgitant velocity is 3.19 m/s, and with an assumed right atrial pressure of 3 mmHg, the estimated right ventricular systolic pressure is 42.6 mmHg. Left Atrium: Left atrial size was mildly dilated. Right Atrium: Right atrial size was normal in size. Pericardium: Complex septated fluid collection in the right pleural cavity, consider empyema or malignant effusion. A small pericardial effusion is present. The pericardial effusion is circumferential. There is no evidence of cardiac tamponade. 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 There is mild thickening of the mitral valve leaflet(s). Mild mitral valve regurgitation, with centrally-directed jet. Tricuspid Valve: The tricuspid valve is myxomatous. Tricuspid valve regurgitation is mild to moderate. Aortic Valve: The aortic valve is tricuspid. Aortic valve regurgitation is not visualized. No aortic stenosis is present. Pulmonic Valve: The pulmonic valve was normal in structure. Pulmonic valve regurgitation is mild. Aorta: The aortic root and ascending aorta are structurally normal, with no evidence of dilitation. Venous: IVC assessment for right atrial pressure unable to be performed due to mechanical ventilation. The inferior vena cava is normal in size with greater than 50% respiratory variability, suggesting right atrial pressure of 3 mmHg. IAS/Shunts: No atrial level shunt detected by color flow Doppler. Additional Comments: There is a large pleural effusion in the right lateral region.  LEFT VENTRICLE PLAX 2D LVIDd:         4.43 cm     Diastology LVIDs:         2.80 cm     LV e' medial:    5.08 cm/s LV PW:         1.03 cm     LV E/e' medial:  10.3 LV IVS:        0.87 cm     LV e' lateral:   6.46 cm/s LVOT diam:     2.10 cm     LV E/e' lateral: 8.1 LV SV:         44 LV SV Index:   28 LVOT Area:     3.46 cm  LV Volumes (MOD) LV vol d, MOD A4C: 57.5 ml LV vol s, MOD A4C: 18.7 ml LV SV MOD A4C:     57.5 ml RIGHT VENTRICLE             IVC RV S prime:     13.10 cm/s  IVC diam: 1.10 cm TAPSE (M-mode): 1.7 cm LEFT ATRIUM           Index        RIGHT ATRIUM           Index LA diam:      3.20 cm 2.00 cm/m   RA Area:     14.20 cm LA Vol (A4C): 57.5 ml 35.90 ml/m  RA Volume:   35.90 ml  22.42 ml/m  AORTIC VALVE  PULMONIC VALVE LVOT Vmax:   75.15 cm/s  PR End Diast Vel: 2.14 msec LVOT Vmean:  45.850 cm/s LVOT VTI:    0.127 m  AORTA Ao Root diam: 2.60 cm Ao Asc diam:  2.70 cm MITRAL VALVE               TRICUSPID VALVE MV Area (PHT): 4.89 cm    TR Peak grad:   40.7 mmHg MV  Decel Time: 155 msec    TR Vmax:        319.00 cm/s MV E velocity: 52.40 cm/s MV A velocity: 50.00 cm/s  SHUNTS MV E/A ratio:  1.05        Systemic VTI:  0.13 m                            Systemic Diam: 2.10 cm Dani Gobble Croitoru MD Electronically signed by Sanda Klein MD Signature Date/Time: 08/05/2022/12:38:14 PM    Final    DG CHEST PORT 1 VIEW  Result Date: 08/05/2022 CLINICAL DATA:  Right pleural effusion. EXAM: PORTABLE CHEST 1 VIEW COMPARISON:  08/05/2022 FINDINGS: Stable cardiomediastinal contours. Left lung is clear. Complete opacification of the right hemithorax is again noted and appears unchanged. Findings are favored to represent known large right pleural effusion. IMPRESSION: 1. No change in complete opacification of the right hemithorax which is presumed to represent large right pleural effusion. Electronically Signed   By: Kerby Moors M.D.   On: 08/05/2022 10:06   DG CHEST PORT 1 VIEW  Result Date: 08/05/2022 CLINICAL DATA:  Respiratory compromise EXAM: PORTABLE CHEST 1 VIEW COMPARISON:  08/03/2022 FINDINGS: Complete opacification of the right hemithorax, presumably reflecting a large pleural effusion. Left lung is clear. No pneumothorax. The heart is normal in size. IMPRESSION: Complete opacification of the right hemithorax, presumably reflecting a large pleural effusion. Electronically Signed   By: Julian Hy M.D.   On: 08/05/2022 00:41   US THORACENTESIS ASP PLEURAL SPACE W/IMG GUIDE  Result Date: 08/03/2022 INDICATION: Patient with history of right-sided breast cancer, right pleural effusion. Request is made for diagnostic and therapeutic thoracentesis. EXAM: ULTRASOUND GUIDED RIGHT THORACENTESIS MEDICATIONS: 10 mL 1% lidocaine COMPLICATIONS: None immediate. PROCEDURE: An ultrasound guided thoracentesis was thoroughly discussed with the patient and questions answered. The benefits, risks, alternatives and complications were also discussed. The patient understands and wishes to  proceed with the procedure. Written consent was obtained. Ultrasound was performed to localize and mark an adequate pocket of fluid in the right chest. The area was then prepped and draped in the normal sterile fashion. 1% Lidocaine was used for local anesthesia. Under ultrasound guidance a 6 Fr Safe-T-Centesis catheter was introduced. Thoracentesis was performed. The catheter was removed and a dressing applied. FINDINGS: A total of approximately 800 mL of yellow fluid was removed. Samples were sent to the laboratory as requested by the clinical team. IMPRESSION: Successful ultrasound guided right thoracentesis yielding 800 mL of pleural fluid. Read by: Brynda Greathouse PA-C Electronically Signed   By: Sandi Mariscal M.D.   On: 08/03/2022 13:06   DG Chest Port 1 View  Result Date: 08/03/2022 CLINICAL DATA:  Post right thoracentesis EXAM: PORTABLE CHEST 1 VIEW COMPARISON:  08/02/2022 FINDINGS: Continued complete opacification of the right hemithorax. No visible pneumothorax. No confluent opacity or effusion on the left. Heart is normal size. IMPRESSION: Continued complete opacification of the right hemithorax. No visible pneumothorax. Electronically Signed   By: Rolm Baptise M.D.  On: 08/03/2022 11:52   DG Chest Portable 1 View  Result Date: 08/02/2022 CLINICAL DATA:  Weakness and breast cancer.  Dehydration.  Fatigue. EXAM: PORTABLE CHEST 1 VIEW COMPARISON:  Chest CT 06/25/2022 FINDINGS: Increased right pleural effusion with complete opacification of the right hemithorax. There is leftward shift of the trachea and mediastinal structures. No aerated right lung is seen. No focal airspace disease in the left lung. Left lung nodules on prior CT are not seen by radiograph. No visible pneumothorax. Osseous metastatic disease on CT not well demonstrated. IMPRESSION: Increased right pleural effusion with complete opacification of the right hemithorax. No aerated right lung is seen. Leftward shift of the trachea and  mediastinal structures. Electronically Signed   By: Keith Rake M.D.   On: 08/02/2022 16:53

## 2022-08-21 NOTE — Progress Notes (Signed)
Pt refused CBG checks. Pt educated on subject matter, pt aware of risks associated. Jerene Pitch

## 2022-08-21 NOTE — Plan of Care (Signed)
  Problem: Education: Goal: Knowledge of General Education information will improve Description Including pain rating scale, medication(s)/side effects and non-pharmacologic comfort measures Outcome: Progressing   Problem: Health Behavior/Discharge Planning: Goal: Ability to manage health-related needs will improve Outcome: Progressing   

## 2022-08-22 ENCOUNTER — Inpatient Hospital Stay (HOSPITAL_COMMUNITY): Payer: BC Managed Care – PPO

## 2022-08-22 DIAGNOSIS — J90 Pleural effusion, not elsewhere classified: Secondary | ICD-10-CM | POA: Diagnosis not present

## 2022-08-22 DIAGNOSIS — C50911 Malignant neoplasm of unspecified site of right female breast: Secondary | ICD-10-CM | POA: Diagnosis not present

## 2022-08-22 NOTE — Progress Notes (Signed)
Pt refusing CBG at this time. PT educated

## 2022-08-22 NOTE — Progress Notes (Signed)
NAME:  [name], MRN:  [id number], DOB:  Jul 10, 1961, LOS: 30 ADMISSION DATE:  08/02/2022, CONSULTATION DATE:  08/05/22 REFERRING MD:  Gershon Cull , CHIEF COMPLAINT:   Lethargy, ams   BRIEF  61 year old with metastatic breast cancer, hypertension with recurrent right-sided malignant effusion presenting with decreased appetite, weakness and lethargy.  She underwent thoracentesis on admission showing MSSA empyema.  She underwent emergent thoracentesis 11/8.  Required vasopressors, weaned off.   Pertinent  Medical History  HTN Breast cancer - R chest wall, contralateral breast (L. Mets to lung and bones.  Responding well to chemo/antiestrogen.   Failutre to thrive, weight loss, malignant cachexia  Malignant pleural effusion R   Home meds: abemaciclib, arimidex, calcium carbonate, vit D, Dilaudid '8mg'$  q6 prn  Mag oxide, mirtazapine 7.'5mg'$  qhs  Miralax, senna  Flagyl gel   Significant Hospital Events: Including procedures, antibiotic start and stop dates in addition to other pertinent events   03/23/2022: Right-sided thoracentesis: LDH 259.  White cells 207.  8% polymorphs. Malignant cells consistent with breast cancer present 08/02/2022 - admit MRSA PCR neg 08/03/2022: Right thoracentesis 800 mL and stopped due to pain LDH 439, white cell 7100/84% polymorphs > huge increase in white count and left shift. MSSA gorwing from pleural fluids 08/03/22 08/05/22 -PCCM consult and ICU tx Left groin CVL Left groin a-line  08/05/22 -last Narcan was at 4 AM.  Now completely awake on BiPAP.  Wants BiPAP off.  No respiratory distress.  Remains on vasopressors Levophed 9 mcg and vasopressin.  Diastolic is low at 40 with map of 71.  She has left femoral arterial line and central line.  She is oriented and communicating.  She remains afebrile.  White count is high.  She is on antibiotics vancomycin and cefepime. ECHO - ef65%. RSVP 45+., Mild to mod TR. Mild MR Family not in favor for pleurx. Dr Alvy Bimler  recommending 1 more thora 08/06/22 worsening effusion with ?tension physiology s/p emergent thora. Repeat pleural Cx showing staph 11/10 after extensive patient with family informed decision made for right pigtail catheter by IR 11/12 CT drainage 1070. S/p tPA, dornase 11/13 CT drainage 1L, s/p tPA, dornase 11/15 tpa, dornase for decreased chest tube output 11/20 CT drainage 450 cc 11/21 CT drainage 290 cc 11/22 Chest tube removed  Interim History / Subjective:   Remains on room air. CXR with stable pleural effusions  Objective   Blood pressure 115/67, pulse (!) 105, temperature 99.4 F (37.4 C), temperature source Oral, resp. rate 18, height '5\' 6"'$  (1.676 m), weight 68.1 kg, SpO2 99 %.        Intake/Output Summary (Last 24 hours) at 08/22/2022 1153 Last data filed at 08/22/2022 0900 Gross per 24 hour  Intake 2235.79 ml  Output 600 ml  Net 1635.79 ml   Filed Weights   08/18/22 0500 08/21/22 0500 08/22/22 0345  Weight: 68.9 kg 65.5 kg 68.1 kg   Physical Exam: General: Chronically ill-appearing, no acute distress HENT: [place], AT, OP clear, MMM Eyes: EOMI, no scleral icterus Respiratory: Diminished bibasilar air entry. 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 - S/p tPA and  dornase x 3 with good response.  Improved but continued high chest tube output likely related to malignant pleural effusion and poor nutritional status. - Chest tube 11/10-11/22 - CXR 11/24 stable effusion with right hydropneumothorax Plan: - Recommend 6 week course of ABX (end date 12/20) - ancef  while inpatient, consider transition to augmentin as nears discharge - CXR PRN if new O2 requirement or shortness of breath/chest pain  - 11/23 Family discussion with patient and mom with Oncology and Pulmonary team. If hydropneumothorax is stable for the remainder of this hospitalization, no additional pleural intervention needed. Will need to arrange pulmonary follow-up in 1 month closer to discharge to assess patient after antibiotic completion (ok to see Dr. Loanne Drilling).  - In the setting of active infection and known skin-related changes from cancer treatment, prefer thoracentesis if malignant effusion worsens. PleurX catheter would only be considered if effusion is recurrent and rapidly accumulating however all parties aware this is not ideal in the setting of empyema given the risk of infection as well as the lack of social support to manage device at home or facilities.  Metastatic breast cancer -mgmt per Dr. Alvy Bimler. Discussed with team regarding family concerns about overall treatment. -Need to continue to focus aggressively on current infection, nutrition and rehabilitation  Goals of Care -DNR after discussion with Dr. Alvy Bimler  -ongoing discussions -prognosis remains guarded with co-morbidities, poor nutrition, advanced cancer  Pulmonary will arrange follow-up in 1 month. Will obtain CXR on that visit. Will see patient again on 11/26 if she is still inpatient otherwise ok to discharge from a pulmonary standpoint.  Best Practice (right click and "Reselect all SmartList Selections" daily)   Per [facility]   Signature:  Care Time: 25 min  [place] 08/22/22 11:53 AM [facility]

## 2022-08-22 NOTE — Plan of Care (Signed)
  Problem: Nutrition: Goal: Adequate nutrition will be maintained Outcome: Progressing   

## 2022-08-22 NOTE — Progress Notes (Signed)
Progress Note    Miller Edgington   KMQ:[id number]  DOB: Aug 04, 1961  DOA: 08/02/2022     18 PCP: Heath Lark, MD  Initial CC: Fatigue, weakness  Hospital Course: Ms. Diffee is a 61 year old female with PMH right breast cancer, chronic right pleural malignant effusion, chronic cancer related pain, malnutrition who presented from home with complaints of fatigue, generalized weakness, loss of appetite.   Chest x-ray concerning for increased right pleural effusion with complete opacification of the right hemithorax.  Important events: 11/4-admit to the hospital. 11/5-right-sided thoracentesis 800 mL removed, LDH 439, white cell 84%.  MSSA from the pleural fluid. 11/7-PCCM consulted and transferred to ICU, started on vasopressors with left groin central line.  Treated with BiPAP. 11/8-worsening pleural effusion, emergent thoracentesis done persistent staph 11/10-pigtail catheter was placed and currently getting Lasix through the tube. 11/12 transfer to [facility] service.   Remains on pigtail catheter with tPA.   Remains in the hospital, chest tube management, IV antibiotics.  Frailty and debility. Chest tube is actively draining. 11/22: Chest tube removed  Interval History:  No events overnight.  Patient resting comfortably in bed when seen.  She was eager for working with physical therapy today.  Assessment and Plan:  Recurrent right-sided pleural effusion - Secondary to breast cancer.  On presentation,Chest x-ray showed complete opacification of right hemithorax, Leftward shift of the trachea and mediastinal structures.  - s/p chest tube placement. Not a candidate for VATS.  PCCM team following.   - chest tube removed 11/22 -Repeat CXR 08/22/2022 showing stable bilateral effusions.  Outpatient follow-up with pulmonology planned with repeat imaging at that time.  Patient has been cleared for discharge from pulmonology standpoint - 6 week abx course recommended per pulmonology; end date 12/20;  will d/c on Augmentin at discharge; continue ancef for now   Septic shock - resolved MSSA empyema Continue current antibiotics.  Sepsis physiology has improved.  Fluid culture showed MSSA.  Repeat cultures on 11/14 with no growth  Physical deconditioning  -Overall deconditioning in setting of underlying malignancy and poor nutritional status - Evaluated by PT, recommendation is for SNF - Awaiting placement  Transient A-fib: Likely secondary to respiratory distress from empyema. CHA2DS2VAsc of 1   Breast cancer: Followed by Dr. Alvy Bimler.  On Verzenio and Arimidex at home. Continue anastrozole for now.   Chronic cancer rated pain: Continue oral Dilaudid as needed   Protein calorie malnutrition: On Remeron ,megace - RD following   Normocytic anemia: Currently hemoglobin stable.  Most likely associated with malignancy     Old records reviewed in assessment of this patient   DVT prophylaxis:  enoxaparin (LOVENOX) injection 40 mg Start: 08/06/22 2200 Place and maintain sequential compression device Start: 08/06/22 1943   Code Status:   Code Status: DNR  Mobility Assessment (last 72 hours)     Mobility Assessment     Row Name 08/22/22 1328 08/22/22 0913 08/21/22 2010 08/21/22 0746 08/20/22 1000   Does patient have an order for bedrest or is patient medically unstable -- No - Continue assessment No - Continue assessment No - Continue assessment No - Continue assessment   What is the highest level of mobility based on the progressive mobility assessment? Level 5 (Walks with assist in room/hall) - Balance while stepping forward/back and can walk in room with assist - Complete Level 4 (Walks with assist in room) - Balance while marching in place and cannot step forward and back - Complete Level 4 (Walks with assist in room) -  Balance while marching in place and cannot step forward and back - Complete Level 4 (Walks with assist in room) - Balance while marching in place and cannot step  forward and back - Complete Level 4 (Walks with assist in room) - Balance while marching in place and cannot step forward and back - Complete   Is the above level different from baseline mobility prior to current illness? -- Yes - Recommend PT order Yes - Recommend PT order Yes - Recommend PT order Yes - Recommend PT order    Row Name 08/19/22 2100           Does patient have an order for bedrest or is patient medically unstable No - Continue assessment                Barriers to discharge:  Disposition Plan:  SNF Status is: Inpt  Objective: Blood pressure 122/87, pulse 97, temperature 98.7 F (37.1 C), temperature source Oral, resp. rate 18, height '5\' 6"'$  (1.676 m), weight 68.1 kg, SpO2 98 %.  Examination:  Physical Exam Constitutional:      Appearance: Normal appearance.  HENT:     Head: Normocephalic and atraumatic.     Mouth/Throat:     Mouth: Mucous membranes are moist.  Eyes:     Extraocular Movements: Extraocular movements intact.  Cardiovascular:     Rate and Rhythm: Normal rate and regular rhythm.  Pulmonary:     Comments: Coarse breath sounds Abdominal:     General: Bowel sounds are normal. There is no distension.     Palpations: Abdomen is soft.     Tenderness: There is no abdominal tenderness.  Musculoskeletal:        General: Normal range of motion.     Cervical back: Normal range of motion.  Skin:    General: Skin is warm.  Neurological:     General: No focal deficit present.     Mental Status: She is alert.  Psychiatric:        Mood and Affect: Mood normal.      Consultants:  PCCM Oncology   Procedures:    Data Reviewed: No results found for this or any previous visit (from the past 24 hour(s)).   I have Reviewed nursing notes, Vitals, and Lab results since pt's last encounter. Pertinent lab results : see above I have ordered test including BMP, CBC, Mg I have reviewed the last note from staff over past 24 hours I have discussed pt's  care plan and test results with nursing staff, case manager     LOS: 18 days   Dwyane Dee, MD [facility] 08/22/2022, 2:55 PM

## 2022-08-22 NOTE — Progress Notes (Signed)
Physical Therapy Treatment Patient Details Name: [name] MRN: [id number] DOB: 11/20/1960 Today'Wendy Date: 08/22/2022   History of Present Illness [name] is a 61 yo woman with a hx of metastatic breast cancer, HTN, admitted 11/4 with decreased appetite, poor po intake , weakness, lethargy,persistent pleural effusion despite thoracentesis, FTT, transfer to iCU 11/6 for decreased response and  low BP,  right CT placed .    PT Comments    A&O x 3 pleasant lady, eager, and motivated. Denies any pain. Patient required min assist for bed mobility for safety with increased time; Min assist needed for transfers for safety. Cues needed to scoot to edge of bed. Multiple cues needed to push up from bed to power up, however pt continued to grab the RW with both UE'Wendy to pull up "this works better for me." Educated pt about safety; required min guard for ambulation. Cues needed to decrease gait speed for safety "I feel good when I walk. Im just ready to get back to walking 2 miles a day." Pt will need ST Rehab at SNF to address mobility and functional decline prior to safely returning home.   Recommendations for follow up therapy are one component of a multi-disciplinary discharge planning process, led by the attending physician.  Recommendations may be updated based on patient status, additional functional criteria and insurance authorization.  Follow Up Recommendations  Skilled nursing-short term rehab (<3 hours/day) Can patient physically be transported by private vehicle: No   Assistance Recommended at Discharge Frequent or constant Supervision/Assistance  Patient can return home with the following A lot of help with walking and/or transfers;A lot of help with bathing/dressing/bathroom;Assistance with cooking/housework;Assist for transportation   Equipment Recommendations  None recommended by PT    Recommendations for Other Services       Precautions / Restrictions Precautions Precautions:  Fall Restrictions Weight Bearing Restrictions: No     Mobility  Bed Mobility Overal bed mobility: Needs Assistance Bed Mobility: Supine to Sit     Supine to sit: Min assist     General bed mobility comments: min assist needed for safety with increased time.    Transfers Overall transfer level: Needs assistance Equipment used: Rolling walker (2 wheels) Transfers: Sit to/from Stand Sit to Stand: Min assist           General transfer comment: cues to scoot to edge. Multiple cues needed to push up from bed to power up, however pt continued to grab the RW with both UE'Wendy to pull up "no this works better for me." Educated pt about safety.    Ambulation/Gait Ambulation/Gait assistance: Min guard Gait Distance (Feet): 250 Feet Assistive device: Rolling walker (2 wheels) Gait Pattern/deviations: Step-through pattern, Narrow base of support, Trunk flexed Gait velocity: decreased     General Gait Details: Cues needed to decrease gait speed for safety "I'm just ready to get back to walking 2 miles a day."   Stairs             Wheelchair Mobility    Modified Rankin (Stroke Patients Only)       Balance                                            Cognition Arousal/Alertness: Awake/alert Behavior During Therapy: WFL for tasks assessed/performed Overall Cognitive Status: Within Functional Limits for tasks assessed  General Comments: AxO x 3 pleasant Lady, motivated, eager.        Exercises      General Comments        Pertinent Vitals/Pain Pain Assessment Pain Assessment: No/denies pain    Home Living                          Prior Function            PT Goals (current goals can now be found in the care plan section) Acute Rehab PT Goals Patient Stated Goal: to get stronger PT Goal Formulation: With patient Time For Goal Achievement: 08/20/22 Potential to Achieve Goals:  Good Progress towards PT goals: Progressing toward goals    Frequency    Min 2X/week      PT Plan Current plan remains appropriate    Co-evaluation              AM-PAC PT "6 Clicks" Mobility   Outcome Measure  Help needed turning from your back to your side while in a flat bed without using bedrails?: A Little Help needed moving from lying on your back to sitting on the side of a flat bed without using bedrails?: A Little Help needed moving to and from a bed to a chair (including a wheelchair)?: A Little Help needed standing up from a chair using your arms (e.g., wheelchair or bedside chair)?: A Little Help needed to walk in hospital room?: A Little Help needed climbing 3-5 steps with a railing? : A Lot 6 Click Score: 17    End of Session Equipment Utilized During Treatment: Gait belt Activity Tolerance: Patient tolerated treatment well;No increased pain Patient left: with call bell/phone within reach;in chair;with chair alarm set Nurse Communication: Mobility status PT Visit Diagnosis: Unsteadiness on feet (R26.81);Adult, failure to thrive (R62.7);Muscle weakness (generalized) (M62.81);Difficulty in walking, not elsewhere classified (R26.2)     Time: 7414-2395 PT Time Calculation (min) (ACUTE ONLY): 27 min  Charges:  $Gait Training: 8-22 mins $Therapeutic Activity: 8-22 mins                        [name] 08/22/2022, 1:29 PM

## 2022-08-23 DIAGNOSIS — C50911 Malignant neoplasm of unspecified site of right female breast: Secondary | ICD-10-CM | POA: Diagnosis not present

## 2022-08-23 DIAGNOSIS — J90 Pleural effusion, not elsewhere classified: Secondary | ICD-10-CM | POA: Diagnosis not present

## 2022-08-23 DIAGNOSIS — A4901 Methicillin susceptible Staphylococcus aureus infection, unspecified site: Secondary | ICD-10-CM | POA: Diagnosis not present

## 2022-08-23 NOTE — TOC Progression Note (Addendum)
Transition of Care [facility]) - Progression Note    Patient Details  Name: [name] MRN: [id number] Date of Birth: 12-Jun-1961  Transition of [facility]) CM/SW Contact  Henrietta Dine, RN Phone Number: 08/23/2022, 3:13 PM  Clinical Narrative:    PT recc SNF; spoke with pt and mother in room and she agrees to SNF; the pt says her prefrence is [facility]; attempted to complete PASSR; notified under review and given  Must reference [id number]; completed FL2 and sent to MD for co-sign; will send out for bed offers, will need ins auth from SNF; TOC will con't to follow.  Expected Discharge Plan: [place] Barriers to Discharge: Continued Medical Work up  Expected Discharge Plan and Services Expected Discharge Plan: [place]   Discharge Planning Services: CM Consult   Living arrangements for the past 2 months: Apartment                                       Social Determinants of Health (SDOH) Interventions    Readmission Risk Interventions    08/06/2022   10:01 AM  Readmission Risk Prevention Plan  Medication Review (RN Care Manager) Complete  PCP or Specialist appointment within 3-5 days of discharge Complete  HRI or Home Care Consult Complete  SW Recovery Care/Counseling Consult Complete  Palliative Care Screening Not [place] Complete

## 2022-08-23 NOTE — Plan of Care (Signed)
  Problem: Education: Goal: Knowledge of General Education information will improve Description: Including pain rating scale, medication(s)/side effects and non-pharmacologic comfort measures Outcome: Progressing   Problem: Pain Managment: Goal: General experience of comfort will improve Outcome: Progressing   Problem: Safety: Goal: Ability to remain free from injury will improve Outcome: Progressing   

## 2022-08-23 NOTE — Progress Notes (Signed)
Progress Note    [name]   YCX:[id number]  DOB: [birth year]  DOA: 08/02/2022     19 PCP: Heath Lark, MD  Initial CC: Fatigue, weakness  Hospital Course: [name] is a 61 year old female with PMH right breast cancer, chronic right pleural malignant effusion, chronic cancer related pain, malnutrition who presented from home with complaints of fatigue, generalized weakness, loss of appetite.   Chest x-ray concerning for increased right pleural effusion with complete opacification of the right hemithorax.  Important events: 11/4-admit to the hospital. 11/5-right-sided thoracentesis 800 mL removed, LDH 439, white cell 84%.  MSSA from the pleural fluid. 11/7-PCCM consulted and transferred to ICU, started on vasopressors with left groin central line.  Treated with BiPAP. 11/8-worsening pleural effusion, emergent thoracentesis done persistent staph 11/10-pigtail catheter was placed and currently getting Lasix through the tube. 11/12 transfer to [facility] service.   Remains on pigtail catheter with tPA.   Remains in the hospital, chest tube management, IV antibiotics.  Frailty and debility. Chest tube is actively draining. 11/22: Chest tube removed  Interval History:  No events overnight.  Has continued to work with PT. Plan is still rehab.   Assessment and Plan:  Recurrent right-sided pleural effusion - Secondary to breast cancer.  On presentation,Chest x-ray showed complete opacification of right hemithorax, Leftward shift of the trachea and mediastinal structures.  - s/p chest tube placement. Not a candidate for VATS.  PCCM team following.   - chest tube removed 11/22 -Repeat CXR 08/22/2022 showing stable bilateral effusions.  Outpatient follow-up with pulmonology planned with repeat imaging at that time.  Patient has been cleared for discharge from pulmonology standpoint - 6 week abx course recommended per pulmonology; end date 12/20; will d/c on Augmentin at discharge; continue  ancef for now   Septic shock - resolved MSSA empyema Continue current antibiotics.  Sepsis physiology has improved.  Fluid culture showed MSSA.  Repeat cultures on 11/14 with no growth  Physical deconditioning  -Overall deconditioning in setting of underlying malignancy and poor nutritional status - Evaluated by PT, recommendation is for SNF - Awaiting placement  Transient A-fib: Likely secondary to respiratory distress from empyema. CHA2DS2VAsc of 1   Breast cancer: Followed by Dr. Alvy Bimler.  On Verzenio and Arimidex at home. Continue anastrozole for now.   Chronic cancer rated pain: Continue oral Dilaudid as needed   Protein calorie malnutrition: On Remeron ,megace - RD following   Normocytic anemia: Currently hemoglobin stable.  Most likely associated with malignancy     Old records reviewed in assessment of this patient   DVT prophylaxis:  enoxaparin (LOVENOX) injection 40 mg Start: 08/06/22 2200 Place and maintain sequential compression device Start: 08/06/22 1943   Code Status:   Code Status: DNR  Mobility Assessment (last 72 hours)     Mobility Assessment     Row Name 08/23/22 0900 08/22/22 2119 08/22/22 1328 08/22/22 0913 08/21/22 2010   Does patient have an order for bedrest or is patient medically unstable No - Continue assessment No - Continue assessment -- No - Continue assessment No - Continue assessment   What is the highest level of mobility based on the progressive mobility assessment? Level 5 (Walks with assist in room/hall) - Balance while stepping forward/back and can walk in room with assist - Complete Level 5 (Walks with assist in room/hall) - Balance while stepping forward/back and can walk in room with assist - Complete Level 5 (Walks with assist in room/hall) - Balance while stepping forward/back and can  walk in room with assist - Complete Level 4 (Walks with assist in room) - Balance while marching in place and cannot step forward and back - Complete  Level 4 (Walks with assist in room) - Balance while marching in place and cannot step forward and back - Complete   Is the above level different from baseline mobility prior to current illness? Yes - Recommend PT order Yes - Recommend PT order -- Yes - Recommend PT order Yes - Recommend PT order    [place] Name 08/21/22 0746           Does patient have an order for bedrest or is patient medically unstable No - Continue assessment       What is the highest level of mobility based on the progressive mobility assessment? Level 4 (Walks with assist in room) - Balance while marching in place and cannot step forward and back - Complete       Is the above level different from baseline mobility prior to current illness? Yes - Recommend PT order                Barriers to discharge:  Disposition Plan:  SNF Status is: Inpt  Objective: Blood pressure 131/84, pulse (!) 115, temperature 98.5 F (36.9 C), temperature source Oral, resp. rate 20, height '5\' 6"'$  (1.676 m), weight 69.6 kg, SpO2 96 %.  Examination:  Physical Exam Constitutional:      Appearance: Normal appearance.  HENT:     Head: Normocephalic and atraumatic.     Mouth/Throat:     Mouth: Mucous membranes are moist.  Eyes:     Extraocular Movements: Extraocular movements intact.  Cardiovascular:     Rate and Rhythm: Normal rate and regular rhythm.  Pulmonary:     Comments: Coarse breath sounds Abdominal:     General: Bowel sounds are normal. There is no distension.     Palpations: Abdomen is soft.     Tenderness: There is no abdominal tenderness.  Musculoskeletal:        General: Normal range of motion.     Cervical back: Normal range of motion.  Skin:    General: Skin is warm.  Neurological:     General: No focal deficit present.     Mental Status: She is alert.  Psychiatric:        Mood and Affect: Mood normal.      Consultants:  PCCM Oncology   Procedures:    Data Reviewed: No results found for this or any  previous visit (from the past 24 hour(s)).   I have Reviewed nursing notes, Vitals, and Lab results since pt's last encounter. Pertinent lab results : see above I have reviewed the last note from staff over past 24 hours I have discussed pt's care plan and test results with nursing staff, case manager     LOS: 19 days   Dwyane Dee, MD [facility] 08/23/2022, 2:08 PM

## 2022-08-23 NOTE — Progress Notes (Signed)
Mobility Specialist - Progress Note   08/23/22 1218  Mobility  Activity Ambulated with assistance in hallway  Level of Assistance Standby assist, set-up cues, supervision of patient - no hands on  Assistive Device Front wheel walker  Distance Ambulated (ft) 480 ft  Range of Motion/Exercises Active  Activity Response Tolerated well  Mobility Referral Yes  $Mobility charge 1 Mobility   Pt was found on recliner chair and agreeable to ambulate. Was eager to get up and had no complaints during session. At EOS returned to recliner chair with necessities in reach.  Ferd Hibbs Mobility Specialist

## 2022-08-23 NOTE — NC FL2 (Signed)
[place] LEVEL OF CARE SCREENING TOOL     IDENTIFICATION  Patient Name: [name] Birthdate: 08/13/1961 Sex: female Admission Date (Current Location): 08/02/2022  [facility] and [place] Number:  Herbalist and Address:  [facility],  [address], [place]      Provider Number: [id number]  Attending Physician Name and Address:  Dwyane Dee, MD  Relative Name and Phone Number:  [name] (sister) [phone]    Current Level of Care: SNF Recommended Level of Care: Skilled Nursing Facility Prior Approval Number:    Date Approved/Denied:   PASRR Number: pending  Discharge Plan: SNF    Current Diagnoses: Patient Active Problem List   Diagnosis Date Noted   MSSA (methicillin susceptible Staphylococcus aureus) infection 08/12/2022   Empyema, right ([place]) 08/11/2022   Need for management of chest tube 08/11/2022   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   Need for emotional support 08/05/2022   Constipation 08/05/2022   Palliative care encounter 08/05/2022   Protein-calorie malnutrition, severe 08/05/2022   Pleural effusion, right 08/04/2022   Pleural effusion on right 08/02/2022   Protein calorie malnutrition ([place]) 08/02/2022   Goals of care, counseling/discussion 07/25/2022   Vitamin D deficiency 06/12/2022   Situational depression 05/30/2022   GERD (gastroesophageal reflux disease) 05/30/2022   Pancytopenia, acquired ([place]) 05/08/2022   Wound healing, delayed 05/08/2022   Physical deconditioning 04/17/2022   Poor social situation 04/02/2022   Cancer associated pain 03/31/2022   Poor venous access 03/31/2022   Other constipation 03/31/2022   Malnutrition of moderate degree 03/25/2022   Breast cancer metastasized to bone, right ([place]) 03/23/2022   Breast cancer, stage 4, right ([place]) 03/22/2022   Pleural effusion 03/22/2022   Cellulitis 03/22/2022   Microcytic anemia 03/22/2022   Thrombocytosis 03/22/2022    Acute renal failure (ARF) ([place]) 03/22/2022   Lactic acidosis 03/22/2022   Essential hypertension 01/27/2018   Screening for diabetes mellitus 01/27/2018   Screening for thyroid disorder 01/27/2018    Orientation RESPIRATION BLADDER Height & Weight     Self, Time, Situation, Place  Normal Continent Weight: 69.6 kg Height:  '5\' 6"'$  (167.6 cm)  BEHAVIORAL SYMPTOMS/MOOD NEUROLOGICAL BOWEL NUTRITION STATUS      Continent Diet (regular)  AMBULATORY STATUS COMMUNICATION OF NEEDS Skin   Limited Assist Verbally Other (Comment) (weeping wound to right breast)                       Personal Care Assistance Level of Assistance  Bathing, Feeding, Dressing Bathing Assistance: Limited assistance Feeding assistance: Independent Dressing Assistance: Limited assistance     Functional Limitations Info  Sight, Hearing, Speech Sight Info: Adequate Hearing Info: Adequate Speech Info: Adequate    SPECIAL CARE FACTORS FREQUENCY  PT (By licensed PT), OT (By licensed OT)     PT Frequency: 5x/week OT Frequency: 5x/week            Contractures Contractures Info: Not present    Additional Factors Info  Code Status, Allergies Code Status Info: DNR Allergies Info: NKDA           Current Medications (08/23/2022):  This is the current hospital active medication list Current Facility-Administered Medications  Medication Dose Route Frequency Provider Last Rate Last Admin   0.9 %  sodium chloride infusion  250 mL Intravenous PRN Opyd, Ilene Qua, MD       0.9 %  sodium chloride infusion  250 mL Intravenous Continuous Kathryne Eriksson, NP  Stopped at 08/19/22 4627   acetaminophen (TYLENOL) tablet 650 mg  650 mg Oral Q6H PRN Vianne Bulls, MD   650 mg at 08/09/22 1709   Or   acetaminophen (TYLENOL) suppository 650 mg  650 mg Rectal Q6H PRN Opyd, Ilene Qua, MD       anastrozole (ARIMIDEX) tablet 1 mg  1 mg Oral Daily Opyd, Ilene Qua, MD   1 mg at 08/23/22 0948   bisacodyl (DULCOLAX) EC  tablet 5 mg  5 mg Oral Daily PRN Vianne Bulls, MD   5 mg at 08/13/22 0953   calcium carbonate (TUMS - dosed in mg elemental calcium) chewable tablet 200 mg of elemental calcium  1 tablet Oral BID Opyd, Ilene Qua, MD   200 mg of elemental calcium at 08/23/22 0948   ceFAZolin (ANCEF) IVPB 2g/100 mL premix  2 g Intravenous Q8H Ramaswamy, Belva Crome, MD 200 mL/hr at 08/23/22 1313 2 g at 08/23/22 1313   enoxaparin (LOVENOX) injection 40 mg  40 mg Subcutaneous Q24H Lenis Noon, RPH   40 mg at 08/21/22 2100   famotidine (PEPCID) tablet 20 mg  20 mg Oral Daily Brand Males, MD   20 mg at 08/23/22 0948   feeding supplement (BOOST / RESOURCE BREEZE) liquid 1 Container  1 Container Oral QID Shelly Coss, MD   1 Container at 08/23/22 1311   fentaNYL (SUBLIMAZE) injection   Intravenous PRN Mir, Paula Libra, MD   50 mcg at 08/08/22 1324   HYDROmorphone (DILAUDID) injection 0.5 mg  0.5 mg Intravenous Q4H PRN Shelly Coss, MD   0.5 mg at 08/12/22 1354   HYDROmorphone (DILAUDID) tablet 4 mg  4 mg Oral Q4H PRN Barb Merino, MD   4 mg at 08/23/22 1527   megestrol (MEGACE) tablet 40 mg  40 mg Oral BID Heath Lark, MD   40 mg at 08/23/22 0948   mirtazapine (REMERON) tablet 7.5 mg  7.5 mg Oral QHS Opyd, Ilene Qua, MD   7.5 mg at 08/22/22 2115   multivitamin with minerals tablet 1 tablet  1 tablet Oral Daily Barb Merino, MD   1 tablet at 08/23/22 0948   naloxone (NARCAN) injection 0.2 mg  0.2 mg Intravenous PRN Kathryne Eriksson, NP   0.2 mg at 08/05/22 0345   Oral care mouth rinse  15 mL Mouth Rinse PRN Brand Males, MD       polyethylene glycol (MIRALAX / GLYCOLAX) packet 17 g  17 g Oral Daily Opyd, Ilene Qua, MD   17 g at 08/16/22 0846   senna (SENOKOT) tablet 17.2 mg  2 tablet Oral BID Opyd, Ilene Qua, MD   17.2 mg at 08/15/22 1042   sodium chloride flush (NS) 0.9 % injection 3 mL  3 mL Intravenous Q12H Opyd, Ilene Qua, MD   3 mL at 08/22/22 2117   sodium chloride flush (NS) 0.9 % injection 3 mL  3  mL Intravenous PRN Opyd, Ilene Qua, MD         Discharge Medications: Please see discharge summary for a list of discharge medications.  Relevant Imaging Results:  Relevant Lab Results:   Additional Information SSN [id number]  Henrietta Dine, RN

## 2022-08-24 DIAGNOSIS — J869 Pyothorax without fistula: Secondary | ICD-10-CM | POA: Diagnosis not present

## 2022-08-24 DIAGNOSIS — C50911 Malignant neoplasm of unspecified site of right female breast: Secondary | ICD-10-CM | POA: Diagnosis not present

## 2022-08-24 DIAGNOSIS — J910 Malignant pleural effusion: Secondary | ICD-10-CM | POA: Diagnosis not present

## 2022-08-24 DIAGNOSIS — J90 Pleural effusion, not elsewhere classified: Secondary | ICD-10-CM | POA: Diagnosis not present

## 2022-08-24 NOTE — TOC Progression Note (Signed)
Transition of Care [facility]) - Progression Note    Patient Details  Name: [name] MRN: [id number] Date of Birth: [birth year]  Transition of Care [facility]) CM/SW Contact  Henrietta Dine, RN Phone Number: 08/24/2022, 11:57 AM  Clinical Narrative:    [place] MUST request additional documents be sent for PASRR review; H&P, progress notes, FL2, and 95-MWU certificate uploaded to [facility] MUST site; awaiting PASRR #.   Expected Discharge Plan: [place] Barriers to Discharge: Continued Medical Work up  Expected Discharge Plan and Services Expected Discharge Plan: [place]   Discharge Planning Services: CM Consult   Living arrangements for the past 2 months: Apartment                                       Social Determinants of Health (SDOH) Interventions    Readmission Risk Interventions    08/06/2022   10:01 AM  Readmission Risk Prevention Plan  Medication Review (RN Care Manager) Complete  PCP or Specialist appointment within 3-5 days of discharge Complete  HRI or Home Care Consult Complete  SW Recovery Care/Counseling Consult Complete  Palliative Care Screening Not [place] Complete

## 2022-08-24 NOTE — Plan of Care (Signed)
  Problem: Education: Goal: Knowledge of General Education information will improve Description: Including pain rating scale, medication(s)/side effects and non-pharmacologic comfort measures Outcome: Progressing   Problem: Coping: Goal: Level of anxiety will decrease Outcome: Progressing   Problem: Safety: Goal: Ability to remain free from injury will improve Outcome: Progressing   

## 2022-08-24 NOTE — TOC PASRR Note (Signed)
Regarding: [name] Date of Birth: 08-22-1961 Date: August 24, 2022    To Whom It May Concern:   Please be advised that the above-named patient will require a short-term Nursing home stat - anticipated 30 days or less for rehabilitation and strengthening. The plan is for return home.  Patient will benefit from ongoing skilled PT services in skilled nursing facility setting to continue to advance safe functional mobility, address ongoing impairments in, and minimize fall risk.                    \

## 2022-08-24 NOTE — Plan of Care (Signed)

## 2022-08-24 NOTE — Progress Notes (Signed)
Progress Note    [name]   KZS:[id number]  DOB: 23-Oct-1960  DOA: 08/02/2022     20 PCP: Heath Lark, MD  Initial CC: Fatigue, weakness  Hospital Course: [name] is a 61 year Wendy female with PMH right breast cancer, chronic right pleural malignant effusion, chronic cancer related pain, malnutrition who presented from home with complaints of fatigue, generalized weakness, loss of appetite.   Chest x-ray concerning for increased right pleural effusion with complete opacification of the right hemithorax.  Important events: 11/4-admit to the hospital. 11/5-right-sided thoracentesis 800 mL removed, LDH 439, white cell 84%.  MSSA from the pleural fluid. 11/7-PCCM consulted and transferred to ICU, started on vasopressors with left groin central line.  Treated with BiPAP. 11/8-worsening pleural effusion, emergent thoracentesis done persistent staph 11/10-pigtail catheter was placed and currently getting Lasix through the tube. 11/12 transfer to [facility] service.   Remains on pigtail catheter with tPA.   Remains in the hospital, chest tube management, IV antibiotics.  Frailty and debility. Chest tube is actively draining. 11/22: Chest tube removed  Interval History:  No events overnight.  Has continued to work with PT. Plan is still rehab.   Assessment and Plan:  Recurrent right-sided pleural effusion - Secondary to breast cancer.  On presentation,Chest x-ray showed complete opacification of right hemithorax, Leftward shift of the trachea and mediastinal structures.  - s/p chest tube placement. Not a candidate for VATS.  PCCM team following.   - chest tube removed 11/22 -Repeat CXR 08/22/2022 showing stable bilateral effusions.  Outpatient follow-up with pulmonology planned with repeat imaging at that time.  [name] - 6 week abx course recommended per pulmonology; end date 12/20; will d/c on Augmentin at discharge; continue  ancef for now - will plan to remove surture prior to discharge, ~11/28   Septic shock - resolved MSSA empyema Continue current antibiotics.  Sepsis physiology has improved.  Fluid culture showed MSSA.  Repeat cultures on 11/14 with no growth  Physical deconditioning  -Overall deconditioning in setting of underlying malignancy and poor nutritional status - Evaluated by PT, recommendation is for SNF - Awaiting placement  Transient A-fib: Likely secondary to respiratory distress from empyema. CHA2DS2VAsc of 1   Breast cancer: Followed by Dr. Alvy Bimler.  On Verzenio and Arimidex at home. Continue anastrozole for now.   Chronic cancer rated pain: Continue oral Dilaudid as needed   Protein calorie malnutrition: On Remeron ,megace - RD following   Normocytic anemia: Currently hemoglobin stable.  Most likely associated with malignancy     [name]   DVT prophylaxis:  enoxaparin (LOVENOX) injection 40 mg Start: 08/06/22 2200 Place and maintain sequential compression device Start: 08/06/22 1943   Code Status:   Code Status: DNR  Mobility Assessment (last 72 hours)     Mobility Assessment     Row Name 08/24/22 0800 08/23/22 1930 08/23/22 0900 08/22/22 2119 08/22/22 1328   Does Wendy have an order for bedrest or is Wendy medically unstable No - Continue assessment No - Continue assessment No - Continue assessment No - Continue assessment --   What is the highest level of mobility based on the progressive mobility assessment? Level 5 (Walks with assist in room/hall) - Balance while stepping forward/back and can walk in room with assist - Complete Level 5 (Walks with assist in room/hall) - Balance while stepping forward/back and can walk in room with assist - Complete Level 5 (Walks with  assist in room/hall) - Balance while stepping forward/back and can walk in room with assist - Complete Level 5 (Walks with assist in room/hall) - Balance while  stepping forward/back and can walk in room with assist - Complete Level 5 (Walks with assist in room/hall) - Balance while stepping forward/back and can walk in room with assist - Complete   Is the above level different from baseline mobility prior to current illness? Yes - Recommend PT order Yes - Recommend PT order Yes - Recommend PT order Yes - Recommend PT order --    [place] Name 08/22/22 0913 08/21/22 2010         Does Wendy have an order for bedrest or is Wendy medically unstable No - Continue assessment No - Continue assessment      What is the highest level of mobility based on the progressive mobility assessment? Level 4 (Walks with assist in room) - Balance while marching in place and cannot step forward and back - Complete Level 4 (Walks with assist in room) - Balance while marching in place and cannot step forward and back - Complete      Is the above level different from baseline mobility prior to current illness? Yes - Recommend PT order Yes - Recommend PT order               Barriers to discharge:  Disposition Plan:  SNF Status is: Inpt  Objective: Blood pressure 138/63, pulse 71, temperature 97.8 F (36.6 C), temperature source Oral, resp. rate 20, height '5\' 6"'$  (1.676 m), weight 69.6 kg, SpO2 98 %.  Examination:  Physical Exam Constitutional:      Appearance: Normal appearance.  HENT:     Head: Normocephalic and atraumatic.     Mouth/Throat:     Mouth: Mucous membranes are moist.  Eyes:     Extraocular Movements: Extraocular movements intact.  Cardiovascular:     Rate and Rhythm: Normal rate and regular rhythm.  Pulmonary:     Comments: Coarse breath sounds Abdominal:     General: Bowel sounds are normal. There is no distension.     Palpations: Abdomen is soft.     Tenderness: There is no abdominal tenderness.  Musculoskeletal:        General: Normal range of motion.     Cervical back: Normal range of motion.  Skin:    General: Skin is warm.   Neurological:     General: No focal deficit present.     Mental Status: She is alert.  Psychiatric:        Mood and Affect: Mood normal.      Consultants:  PCCM Oncology   Procedures:    Data Reviewed: No results found for this or any previous visit (from the past 24 hour(s)).   I have Reviewed nursing notes, Vitals, and Lab results since pt's last encounter. Pertinent lab results : see above I have reviewed the last note from staff over past 24 hours I have discussed pt's care plan and test results with nursing staff, case manager     LOS: 20 days   Dwyane Dee, MD [facility] 08/24/2022, 4:27 PM

## 2022-08-24 NOTE — Progress Notes (Signed)
Brief PCCM Progress Note  Patient on room air. No respiratory distress. Checked prior chest tube insertion site c/d/I. Will need single suture removed later this week or prior to discharge to facility. Discussed with Dr. Sabino Gasser who plans to remove on 08/26/22. Appreciate the collaboration.  Pulmonary will sign off. Please call for any questions or concerns.

## 2022-08-25 DIAGNOSIS — R5381 Other malaise: Secondary | ICD-10-CM

## 2022-08-25 DIAGNOSIS — J90 Pleural effusion, not elsewhere classified: Secondary | ICD-10-CM | POA: Diagnosis not present

## 2022-08-25 MED ORDER — METOPROLOL TARTRATE 25 MG PO TABS
12.5000 mg | ORAL_TABLET | Freq: Two times a day (BID) | ORAL | Status: DC
  Administered 2022-08-25 (×2): 12.5 mg via ORAL
  Filled 2022-08-25 (×2): qty 1

## 2022-08-25 NOTE — Progress Notes (Signed)
Initial Nutrition Assessment  DOCUMENTATION CODES:   Severe malnutrition in context of chronic illness  INTERVENTION:  Continue regular diet as tolerated Continue Boost Breeze QID (250kcal, 9g protein/bottle) Continue Magic Cup BID (290 kcal and 9 grams of protein  Continue Multivitamin with minerals daily  NUTRITION DIAGNOSIS:  Severe Malnutrition related to chronic illness as evidenced by percent weight loss, severe fat depletion, severe muscle depletion.  GOAL:  Patient will meet greater than or equal to 90% of their needs  MONITOR:  PO intake, Supplement acceptance, Diet advancement  REASON FOR ASSESSMENT:  Consult Calorie Count  ASSESSMENT:  Pt is a 61yo F with PMH of metastatic breast cancer, and HTN who presents with decreased appetite, poor po intake, weakness, and lethargy. Required transfer to ICU for hypotension and lethargy.  Pt's appetite and po intake slowly improving. Observed ~50% meal consumption at lunch today. She reports enjoying the [facility] more than the berry but unfortunately flavor was discontinued. Pt would like to continue to [facility] rather than switching back to Ensure Plus HP or Ensure Enlive. Continue Magic Cup BID. Pt notes weight gain during admission, but some weight gain likely fluid related. Nutrition related labs reviewed and unremarkable.    Weight history during admit: 11/4:55kg 11/6:53.8kg 11/12:63.6kg 11/17:59.2kg 11/24:68.1kg 11/25:69.6kg 11/27:67.8kg   Diet Order:   Diet Order             Diet regular Room service appropriate? Yes; Fluid consistency: Thin  Diet effective now                   EDUCATION NEEDS:   Not appropriate for education at this time  Skin:  Skin Assessment: Reviewed RN Assessment  Last BM:  11/9  Height:  Ht Readings from Last 1 Encounters:  08/03/22 '5\' 6"'$  (1.676 m)   Weight:  Wt Readings from Last 1 Encounters:  08/25/22 67.8 kg    Ideal Body Weight:  59.1 kg  BMI:   Body mass index is 24.13 kg/m.  Estimated Nutritional Needs:  Kcal:  1675-2015kcal ([place] x 1.5-1.8, REE: 1119) Protein:  80-110g (1.5-2.0g/kg) Fluid:  1615-1840m (30-389mkg)  KaCandise BowensMS, RD, LDN, CNSC See AMiON for contact information

## 2022-08-25 NOTE — Progress Notes (Signed)
Progress Note    [name]   EAV:[id number]  DOB: 25-Jul-1961  DOA: 08/02/2022     21 PCP: Heath Lark, MD  Initial CC: Fatigue, weakness  Hospital Course: [name] is a 61 year old female with PMH right breast cancer, chronic right pleural malignant effusion, chronic cancer related pain, malnutrition who presented from home with complaints of fatigue, generalized weakness, loss of appetite.   Chest x-ray concerning for increased right pleural effusion with complete opacification of the right hemithorax.  Important events: 11/4-admit to the hospital. 11/5-right-sided thoracentesis 800 mL removed, LDH 439, white cell 84%.  MSSA from the pleural fluid. 11/7-PCCM consulted and transferred to ICU, started on vasopressors with left groin central line.  Treated with BiPAP. 11/8-worsening pleural effusion, emergent thoracentesis done persistent staph 11/10-pigtail catheter was placed and currently getting Lasix through the tube. 11/12 transfer to [facility] service.   Remains on pigtail catheter with tPA.   Remains in the hospital, chest tube management, IV antibiotics.  Frailty and debility. Chest tube is actively draining. 11/22: Chest tube removed  Interval History:  No events overnight.  Has continued to work with PT. Plan is still rehab.  Some RVR this morning; added lopressor.   Assessment and Plan:  Recurrent right-sided pleural effusion - Secondary to breast cancer.  On presentation,Chest x-ray showed complete opacification of right hemithorax, Leftward shift of the trachea and mediastinal structures.  - s/p chest tube placement. Not a candidate for VATS.  PCCM team following.   - chest tube removed 11/22 -Repeat CXR 08/22/2022 showing stable bilateral effusions.  Outpatient follow-up with pulmonology planned with repeat imaging at that time.  Patient has been cleared for discharge from pulmonology standpoint - 6 week abx course recommended per pulmonology; end date 12/20; will  d/c on Augmentin at discharge; continue ancef for now - will plan to remove surture prior to discharge, ~11/28   Septic shock - resolved MSSA empyema Continue current antibiotics.  Sepsis physiology has improved.  Fluid culture showed MSSA.  Repeat cultures on 11/14 with no growth  Physical deconditioning  -Overall deconditioning in setting of underlying malignancy and poor nutritional status - Evaluated by PT, recommendation is for SNF - Awaiting placement  Transient A-fib: Likely secondary to respiratory distress from empyema. CHA2DS2VAsc of 1 -Some RVR noted morning of 08/25/2022.  Start metoprolol   Breast cancer: Followed by Dr. Alvy Bimler.  On Verzenio and Arimidex at home. Continue anastrozole for now.   Chronic cancer rated pain: Continue oral Dilaudid as needed   Protein calorie malnutrition: On Remeron ,megace - RD following   Normocytic anemia: Currently hemoglobin stable.  Most likely associated with malignancy     Old records reviewed in assessment of this patient   DVT prophylaxis:  enoxaparin (LOVENOX) injection 40 mg Start: 08/06/22 2200 Place and maintain sequential compression device Start: 08/06/22 1943   Code Status:   Code Status: DNR  Mobility Assessment (last 72 hours)     Mobility Assessment     Row Name 08/25/22 1229 08/25/22 0815 08/24/22 2100 08/24/22 0800 08/23/22 1930   Does patient have an order for bedrest or is patient medically unstable -- No - Continue assessment No - Continue assessment No - Continue assessment No - Continue assessment   What is the highest level of mobility based on the progressive mobility assessment? Level 5 (Walks with assist in room/hall) - Balance while stepping forward/back and can walk in room with assist - Complete Level 5 (Walks with assist in room/hall) - Balance  while stepping forward/back and can walk in room with assist - Complete Level 5 (Walks with assist in room/hall) - Balance while stepping forward/back and  can walk in room with assist - Complete Level 5 (Walks with assist in room/hall) - Balance while stepping forward/back and can walk in room with assist - Complete Level 5 (Walks with assist in room/hall) - Balance while stepping forward/back and can walk in room with assist - Complete   Is the above level different from baseline mobility prior to current illness? -- -- Yes - Recommend PT order Yes - Recommend PT order Yes - Recommend PT order    Row Name 08/23/22 0900 08/22/22 2119         Does patient have an order for bedrest or is patient medically unstable No - Continue assessment No - Continue assessment      What is the highest level of mobility based on the progressive mobility assessment? Level 5 (Walks with assist in room/hall) - Balance while stepping forward/back and can walk in room with assist - Complete Level 5 (Walks with assist in room/hall) - Balance while stepping forward/back and can walk in room with assist - Complete      Is the above level different from baseline mobility prior to current illness? Yes - Recommend PT order Yes - Recommend PT order               Barriers to discharge:  Disposition Plan:  SNF Status is: Inpt  Objective: Blood pressure 111/79, pulse 96, temperature 97.6 F (36.4 C), temperature source Oral, resp. rate 18, height '5\' 6"'$  (1.676 m), weight 67.8 kg, SpO2 93 %.  Examination:  Physical Exam Constitutional:      Appearance: Normal appearance.  HENT:     Head: Normocephalic and atraumatic.     Mouth/Throat:     Mouth: Mucous membranes are moist.  Eyes:     Extraocular Movements: Extraocular movements intact.  Cardiovascular:     Rate and Rhythm: Normal rate and regular rhythm.  Pulmonary:     Comments: Coarse breath sounds Abdominal:     General: Bowel sounds are normal. There is no distension.     Palpations: Abdomen is soft.     Tenderness: There is no abdominal tenderness.  Musculoskeletal:        General: Normal range of  motion.     Cervical back: Normal range of motion.  Skin:    General: Skin is warm.  Neurological:     General: No focal deficit present.     Mental Status: She is alert.  Psychiatric:        Mood and Affect: Mood normal.      Consultants:  PCCM Oncology   Procedures:    Data Reviewed: No results found for this or any previous visit (from the past 24 hour(s)).   I have Reviewed nursing notes, Vitals, and Lab results since pt's last encounter. Pertinent lab results : see above I have reviewed the last note from staff over past 24 hours I have discussed pt's care plan and test results with nursing staff, case manager     LOS: 21 days   Dwyane Dee, MD [facility] 08/25/2022, 2:35 PM

## 2022-08-25 NOTE — Plan of Care (Signed)
  Problem: Education: Goal: Knowledge of General Education information will improve Description: Including pain rating scale, medication(s)/side effects and non-pharmacologic comfort measures Outcome: Completed/Met

## 2022-08-25 NOTE — NC FL2 (Signed)
[place] LEVEL OF CARE SCREENING TOOL     IDENTIFICATION  Patient Name: [name] Birthdate: 1960/12/19 Sex: female Admission Date (Current Location): 08/02/2022  [facility] and [place] Number:  Herbalist and Address:  [facility],  [place], [place]      Provider Number: [id number]  Attending Physician Name and Address:  Dwyane Dee, MD  Relative Name and Phone Number:  Sible Straley (sister) [phone]    Current Level of Care: Hospital Recommended Level of Care: [place] Prior Approval Number:    Date Approved/Denied:   PASRR Number: pending  Discharge Plan: SNF    Current Diagnoses: Patient Active Problem List   Diagnosis Date Noted   MSSA (methicillin susceptible Staphylococcus aureus) infection 08/12/2022   Empyema, right ([place]) 08/11/2022   Need for management of chest tube 08/11/2022   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   Need for emotional support 08/05/2022   Constipation 08/05/2022   Palliative care encounter 08/05/2022   Protein-calorie malnutrition, severe 08/05/2022   Pleural effusion, right 08/04/2022   Pleural effusion on right 08/02/2022   Protein calorie malnutrition ([place]) 08/02/2022   Goals of care, counseling/discussion 07/25/2022   Vitamin D deficiency 06/12/2022   Situational depression 05/30/2022   GERD (gastroesophageal reflux disease) 05/30/2022   Pancytopenia, acquired ([place]) 05/08/2022   Wound healing, delayed 05/08/2022   Physical deconditioning 04/17/2022   Poor social situation 04/02/2022   Cancer associated pain 03/31/2022   Poor venous access 03/31/2022   Other constipation 03/31/2022   Malnutrition of moderate degree 03/25/2022   Breast cancer metastasized to bone, right ([place]) 03/23/2022   Breast cancer, stage 4, right ([place]) 03/22/2022   Pleural effusion 03/22/2022   Cellulitis 03/22/2022   Microcytic anemia 03/22/2022   Thrombocytosis 03/22/2022    Acute renal failure (ARF) ([place]) 03/22/2022   Lactic acidosis 03/22/2022   Essential hypertension 01/27/2018   Screening for diabetes mellitus 01/27/2018   Screening for thyroid disorder 01/27/2018    Orientation RESPIRATION BLADDER Height & Weight     Self, Time, Situation, Place  Normal Continent Weight: 67.8 kg Height:  '5\' 6"'$  (167.6 cm)  BEHAVIORAL SYMPTOMS/MOOD NEUROLOGICAL BOWEL NUTRITION STATUS      Continent Diet (regular)  AMBULATORY STATUS COMMUNICATION OF NEEDS Skin   Limited Assist Verbally Other (Comment) (weeping wound to right breast)                       Personal Care Assistance Level of Assistance  Bathing, Feeding, Dressing Bathing Assistance: Limited assistance Feeding assistance: Independent Dressing Assistance: Limited assistance     Functional Limitations Info  Sight, Hearing, Speech Sight Info: Adequate Hearing Info: Adequate Speech Info: Adequate    SPECIAL CARE FACTORS FREQUENCY  PT (By licensed PT), OT (By licensed OT)     PT Frequency: 5x/week OT Frequency: 5x/week            Contractures Contractures Info: Not present    Additional Factors Info  Code Status, Allergies Code Status Info: DNR Allergies Info: NKDA           Current Medications (08/25/2022):  This is the current hospital active medication list Current Facility-Administered Medications  Medication Dose Route Frequency Provider Last Rate Last Admin   0.9 %  sodium chloride infusion  250 mL Intravenous PRN Opyd, Ilene Qua, MD       0.9 %  sodium chloride infusion  250 mL Intravenous Continuous Kathryne Eriksson, NP  Stopped at 08/19/22 8032   acetaminophen (TYLENOL) tablet 650 mg  650 mg Oral Q6H PRN Vianne Bulls, MD   650 mg at 08/09/22 1709   Or   acetaminophen (TYLENOL) suppository 650 mg  650 mg Rectal Q6H PRN Opyd, Ilene Qua, MD       anastrozole (ARIMIDEX) tablet 1 mg  1 mg Oral Daily Opyd, Ilene Qua, MD   1 mg at 08/25/22 1029   bisacodyl (DULCOLAX) EC  tablet 5 mg  5 mg Oral Daily PRN Vianne Bulls, MD   5 mg at 08/13/22 0953   calcium carbonate (TUMS - dosed in mg elemental calcium) chewable tablet 200 mg of elemental calcium  1 tablet Oral BID Opyd, Ilene Qua, MD   200 mg of elemental calcium at 08/25/22 1028   ceFAZolin (ANCEF) IVPB 2g/100 mL premix  2 g Intravenous Q8H Ramaswamy, Belva Crome, MD 200 mL/hr at 08/25/22 0404 2 g at 08/25/22 0404   enoxaparin (LOVENOX) injection 40 mg  40 mg Subcutaneous Q24H Lenis Noon, RPH   40 mg at 08/24/22 2104   famotidine (PEPCID) tablet 20 mg  20 mg Oral Daily Brand Males, MD   20 mg at 08/25/22 1028   feeding supplement (BOOST / RESOURCE BREEZE) liquid 1 Container  1 Container Oral QID Shelly Coss, MD   1 Container at 08/25/22 1032   HYDROmorphone (DILAUDID) injection 0.5 mg  0.5 mg Intravenous Q4H PRN Shelly Coss, MD   0.5 mg at 08/12/22 1354   HYDROmorphone (DILAUDID) tablet 4 mg  4 mg Oral Q4H PRN Barb Merino, MD   4 mg at 08/25/22 0815   megestrol (MEGACE) tablet 40 mg  40 mg Oral BID Alvy Bimler, Ni, MD   40 mg at 08/25/22 1028   metoprolol tartrate (LOPRESSOR) tablet 12.5 mg  12.5 mg Oral BID Dwyane Dee, MD   12.5 mg at 08/25/22 1029   mirtazapine (REMERON) tablet 7.5 mg  7.5 mg Oral QHS Opyd, Ilene Qua, MD   7.5 mg at 08/24/22 2103   multivitamin with minerals tablet 1 tablet  1 tablet Oral Daily Barb Merino, MD   1 tablet at 08/25/22 1028   naloxone (NARCAN) injection 0.2 mg  0.2 mg Intravenous PRN Kathryne Eriksson, NP   0.2 mg at 08/05/22 0345   Oral care mouth rinse  15 mL Mouth Rinse PRN Brand Males, MD       polyethylene glycol (MIRALAX / GLYCOLAX) packet 17 g  17 g Oral Daily Opyd, Ilene Qua, MD   17 g at 08/16/22 0846   senna (SENOKOT) tablet 17.2 mg  2 tablet Oral BID Opyd, Ilene Qua, MD   17.2 mg at 08/15/22 1042   sodium chloride flush (NS) 0.9 % injection 3 mL  3 mL Intravenous Q12H Opyd, Ilene Qua, MD   3 mL at 08/25/22 1034   sodium chloride flush (NS) 0.9 %  injection 3 mL  3 mL Intravenous PRN Opyd, Ilene Qua, MD         Discharge Medications: Please see discharge summary for a list of discharge medications.  Relevant Imaging Results:  Relevant Lab Results:   Additional Information SSN [id number]  Dessa Phi, RN

## 2022-08-25 NOTE — Progress Notes (Signed)
Mobility Specialist - Progress Note   08/25/22 1521  Mobility  Activity Ambulated with assistance in hallway  Level of Assistance Standby assist, set-up cues, supervision of patient - no hands on  Assistive Device Front wheel walker  Distance Ambulated (ft) 400 ft  Range of Motion/Exercises Active  Activity Response Tolerated well  Mobility Referral Yes  $Mobility charge 1 Mobility   Pt was found on recliner chair and agreeable to ambulate. Had no complaints during session and at EOS returned to recliner chair with necessities in reach and mom in room.  Ferd Hibbs Mobility Specialist

## 2022-08-25 NOTE — Progress Notes (Signed)
Physical Therapy Treatment Patient Details Name: [name] MRN: [id number] DOB: [birth year] Today's Date: 08/25/2022   History of Present Illness s Fogelman is a 61 yo woman with a hx of metastatic breast cancer, HTN, admitted 11/4 with decreased appetite, poor po intake , weakness, lethargy,persistent pleural effusion despite thoracentesis, FTT, transfer to iCU 11/6 for decreased response and  low BP,  right CT placed .    PT Comments    Pleasant patient and eager to ambulate. C/o of 9/10 R METS CA pain, no increase throughout session. Medicated prior to session but pt was unsure if it was "working: yet. Required min guard for bed mobility for safety with increased time needed; min guard for transfers from elevated surface. Cues needed for hand placement during transfers. Min guard needed for ambulation for safety. Patient was able to maintain a safe speed during ambulation. Heart rate max was 122 during ambulation. Pt will need ST Rehab at SNF to address mobility and functional decline prior to safely returning home.   Recommendations for follow up therapy are one component of a multi-disciplinary discharge planning process, led by the attending physician.  Recommendations may be updated based on patient status, additional functional criteria and insurance authorization.  Follow Up Recommendations  Skilled nursing-short term rehab (<3 hours/day) Can patient physically be transported by private vehicle: No   Assistance Recommended at Discharge Frequent or constant Supervision/Assistance  Patient can return home with the following A lot of help with walking and/or transfers;A lot of help with bathing/dressing/bathroom;Assistance with cooking/housework;Assist for transportation   Equipment Recommendations  None recommended by PT    Recommendations for Other Services       Precautions / Restrictions Precautions Precautions: Fall Restrictions Weight Bearing Restrictions: No      Mobility  Bed Mobility Overal bed mobility: Needs Assistance Bed Mobility: Supine to Sit     Supine to sit: Min guard     General bed mobility comments: Min guard needed for safety with increased time.    Transfers Overall transfer level: Needs assistance Equipment used: Rolling walker (2 wheels) Transfers: Sit to/from Stand Sit to Stand: Min guard, From elevated surface           General transfer comment: Required min guard for transfers from elevated surface.cues needed for hand placement during transfers.    Ambulation/Gait Ambulation/Gait assistance: Min guard Gait Distance (Feet): 360 Feet Assistive device: Rolling walker (2 wheels) Gait Pattern/deviations: Step-through pattern, Narrow base of support, Trunk flexed       General Gait Details: Patient was able to maintain a safe speed during ambulation. Min guard needed for safety.   Stairs             Wheelchair Mobility    Modified [place] (Stroke Patients Only)       Balance                                            Cognition Arousal/Alertness: Awake/alert Behavior During Therapy: WFL for tasks assessed/performed Overall Cognitive Status: Within Functional Limits for tasks assessed                                 General Comments: AxO x 3 pleasant Lady, motivated, eager.        Exercises      General Comments  Pertinent Vitals/Pain Pain Assessment Pain Assessment: 0-10 Pain Score: 9  Pain Location: R METS CA pain Pain Descriptors / Indicators: Grimacing Pain Intervention(s): Monitored during session, Limited activity within patient's tolerance, Premedicated before session    Home Living                          Prior Function            PT Goals (current goals can now be found in the care plan section) Acute Rehab PT Goals Patient Stated Goal: to get stronger PT Goal Formulation: With patient Time For Goal Achievement:  08/20/22 Potential to Achieve Goals: Good Progress towards PT goals: Progressing toward goals    Frequency    Min 2X/week      PT Plan Current plan remains appropriate    Co-evaluation              AM-PAC PT "6 Clicks" Mobility   Outcome Measure  Help needed turning from your back to your side while in a flat bed without using bedrails?: A Little Help needed moving from lying on your back to sitting on the side of a flat bed without using bedrails?: A Little Help needed moving to and from a bed to a chair (including a wheelchair)?: A Little Help needed standing up from a chair using your arms (e.g., wheelchair or bedside chair)?: A Little Help needed to walk in hospital room?: A Little Help needed climbing 3-5 steps with a railing? : A Lot 6 Click Score: 17    End of Session Equipment Utilized During Treatment: Gait belt Activity Tolerance: Patient tolerated treatment well;No increased pain Patient left: with call bell/phone within reach;in chair;with chair alarm set Nurse Communication: Mobility status PT Visit Diagnosis: Unsteadiness on feet (R26.81);Adult, failure to thrive (R62.7);Muscle weakness (generalized) (M62.81);Difficulty in walking, not elsewhere classified (R26.2)     Time: 3704-8889 PT Time Calculation (min) (ACUTE ONLY): 11 min  Charges:  $Gait Training: 8-22 mins                       Athea Haley 08/25/2022, 12:30 PM

## 2022-08-25 NOTE — TOC Progression Note (Signed)
Transition of Care [facility]) - Progression Note    Patient Details  Name: [name] MRN: [id number] Date of Birth: [birth year]  Transition of Care [facility]) CM/SW Contact  Madell Heino, Juliann Pulse, RN Phone Number: 08/25/2022, 11:38 AM  Clinical Narrative: Phoebe Perch updated. Awaiting pasrr.      Expected Discharge Plan: [place] Barriers to Discharge: Continued Medical Work up  Expected Discharge Plan and Services Expected Discharge Plan: [place]   Discharge Planning Services: CM Consult   Living arrangements for the past 2 months: Apartment                                       Social Determinants of Health (SDOH) Interventions    Readmission Risk Interventions    08/06/2022   10:01 AM  Readmission Risk Prevention Plan  Medication Review (RN Care Manager) Complete  PCP or Specialist appointment within 3-5 days of discharge Complete  HRI or Home Care Consult Complete  SW Recovery Care/Counseling Consult Complete  Palliative Care Screening Not [place] Complete

## 2022-08-26 ENCOUNTER — Other Ambulatory Visit (HOSPITAL_COMMUNITY): Payer: Self-pay

## 2022-08-26 DIAGNOSIS — J90 Pleural effusion, not elsewhere classified: Secondary | ICD-10-CM | POA: Diagnosis not present

## 2022-08-26 MED ORDER — METOPROLOL TARTRATE 25 MG PO TABS
25.0000 mg | ORAL_TABLET | Freq: Two times a day (BID) | ORAL | Status: DC
  Administered 2022-08-26 – 2022-08-30 (×8): 25 mg via ORAL
  Filled 2022-08-26 (×8): qty 1

## 2022-08-26 NOTE — Progress Notes (Signed)
Mobility Specialist - Progress Note   08/26/22 0918  Mobility  Activity Ambulated with assistance in hallway  Level of Assistance Contact guard assist, steadying assist  Assistive Device Front wheel walker  Distance Ambulated (ft) 250 ft  Range of Motion/Exercises Active  Activity Response Tolerated well  Mobility Referral Yes  $Mobility charge 1 Mobility   Pt was found in bed and agreeable to ambulate. Had no complaints during ambulation and at EOS returned to recliner chair with necessities in reach.   Ferd Hibbs Mobility Specialist

## 2022-08-26 NOTE — Progress Notes (Signed)
Mobility Specialist - Progress Note   08/26/22 1513  Mobility  Activity Ambulated with assistance in hallway  Level of Assistance Contact guard assist, steadying assist  Assistive Device Front wheel walker  Distance Ambulated (ft) 500 ft  Range of Motion/Exercises Active  Activity Response Tolerated well  Mobility Referral Yes  $Mobility charge 1 Mobility   Pt was found in bed and agreeable to ambulate. Had no complaints and returned to sit EOB with all necessities in reach. NT notified.   Ferd Hibbs Mobility Specialist

## 2022-08-26 NOTE — NC FL2 (Signed)
[place] LEVEL OF CARE SCREENING TOOL     IDENTIFICATION  Patient Name: [name] Birthdate: [birth year] Sex: female Admission Date (Current Location): 08/02/2022  [facility] and [place] Number:  Herbalist and Address:  [facility],  [address] [place], [place]      Provider Number: [id number]  Attending Physician Name and Address:  Dwyane Dee, MD  Relative Name and Phone Number:  Lilyanah Celestin (sister) [phone]    Current Level of Care: Hospital Recommended Level of Care: [place] Prior Approval Number:    Date Approved/Denied:   PASRR Number:  [id number])  Discharge Plan: SNF    Current Diagnoses: Patient Active Problem List   Diagnosis Date Noted   MSSA (methicillin susceptible Staphylococcus aureus) infection 08/12/2022   Empyema, right ([place]) 08/11/2022   Need for management of chest tube 08/11/2022   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   Need for emotional support 08/05/2022   Constipation 08/05/2022   Palliative care encounter 08/05/2022   Protein-calorie malnutrition, severe 08/05/2022   Pleural effusion, right 08/04/2022   Pleural effusion on right 08/02/2022   Protein calorie malnutrition ([place]) 08/02/2022   Goals of care, counseling/discussion 07/25/2022   Vitamin D deficiency 06/12/2022   Situational depression 05/30/2022   GERD (gastroesophageal reflux disease) 05/30/2022   Pancytopenia, acquired ([place]) 05/08/2022   Wound healing, delayed 05/08/2022   Physical deconditioning 04/17/2022   Poor social situation 04/02/2022   Cancer associated pain 03/31/2022   Poor venous access 03/31/2022   Other constipation 03/31/2022   Malnutrition of moderate degree 03/25/2022   Breast cancer metastasized to bone, right ([place]) 03/23/2022   Breast cancer, stage 4, right ([place]) 03/22/2022   Pleural effusion 03/22/2022   Cellulitis 03/22/2022   Microcytic anemia 03/22/2022   Thrombocytosis  03/22/2022   Acute renal failure (ARF) ([place]) 03/22/2022   Lactic acidosis 03/22/2022   Essential hypertension 01/27/2018   Screening for diabetes mellitus 01/27/2018   Screening for thyroid disorder 01/27/2018    Orientation RESPIRATION BLADDER Height & Weight     Self, Time, Situation, Place  Normal Continent Weight: 67.5 kg Height:  '5\' 6"'$  (167.6 cm)  BEHAVIORAL SYMPTOMS/MOOD NEUROLOGICAL BOWEL NUTRITION STATUS      Continent Diet (regular)  AMBULATORY STATUS COMMUNICATION OF NEEDS Skin   Limited Assist Verbally Other (Comment) (weeping wound to right breast)                       Personal Care Assistance Level of Assistance  Bathing, Feeding, Dressing Bathing Assistance: Limited assistance Feeding assistance: Independent Dressing Assistance: Limited assistance     Functional Limitations Info  Sight, Hearing, Speech Sight Info: Adequate Hearing Info: Adequate Speech Info: Adequate    SPECIAL CARE FACTORS FREQUENCY  PT (By licensed PT), OT (By licensed OT)     PT Frequency: 5x/week OT Frequency: 5x/week            Contractures Contractures Info: Not present    Additional Factors Info  Code Status, Allergies Code Status Info: DNR Allergies Info: NKDA           Current Medications (08/26/2022):  This is the current hospital active medication list Current Facility-Administered Medications  Medication Dose Route Frequency Provider Last Rate Last Admin   0.9 %  sodium chloride infusion  250 mL Intravenous PRN Opyd, Ilene Qua, MD       0.9 %  sodium chloride infusion  250 mL Intravenous Continuous Kathryne Eriksson, NP  Stopped at 08/19/22 3244   acetaminophen (TYLENOL) tablet 650 mg  650 mg Oral Q6H PRN Vianne Bulls, MD   650 mg at 08/09/22 1709   Or   acetaminophen (TYLENOL) suppository 650 mg  650 mg Rectal Q6H PRN Opyd, Ilene Qua, MD       anastrozole (ARIMIDEX) tablet 1 mg  1 mg Oral Daily Opyd, Ilene Qua, MD   1 mg at 08/26/22 0839   bisacodyl  (DULCOLAX) EC tablet 5 mg  5 mg Oral Daily PRN Vianne Bulls, MD   5 mg at 08/13/22 0953   calcium carbonate (TUMS - dosed in mg elemental calcium) chewable tablet 200 mg of elemental calcium  1 tablet Oral BID Opyd, Ilene Qua, MD   200 mg of elemental calcium at 08/26/22 0840   ceFAZolin (ANCEF) IVPB 2g/100 mL premix  2 g Intravenous Q8H Ramaswamy, Belva Crome, MD 200 mL/hr at 08/26/22 0451 2 g at 08/26/22 0451   enoxaparin (LOVENOX) injection 40 mg  40 mg Subcutaneous Q24H Lenis Noon, RPH   40 mg at 08/24/22 2104   famotidine (PEPCID) tablet 20 mg  20 mg Oral Daily Brand Males, MD   20 mg at 08/26/22 0839   feeding supplement (BOOST / RESOURCE BREEZE) liquid 1 Container  1 Container Oral QID Shelly Coss, MD   1 Container at 08/26/22 0841   HYDROmorphone (DILAUDID) injection 0.5 mg  0.5 mg Intravenous Q4H PRN Shelly Coss, MD   0.5 mg at 08/12/22 1354   HYDROmorphone (DILAUDID) tablet 4 mg  4 mg Oral Q4H PRN Barb Merino, MD   4 mg at 08/26/22 0839   megestrol (MEGACE) tablet 40 mg  40 mg Oral BID Heath Lark, MD   40 mg at 08/26/22 0839   metoprolol tartrate (LOPRESSOR) tablet 25 mg  25 mg Oral BID Dwyane Dee, MD   25 mg at 08/26/22 0839   mirtazapine (REMERON) tablet 7.5 mg  7.5 mg Oral QHS Opyd, Ilene Qua, MD   7.5 mg at 08/25/22 2043   multivitamin with minerals tablet 1 tablet  1 tablet Oral Daily Barb Merino, MD   1 tablet at 08/26/22 0839   naloxone (NARCAN) injection 0.2 mg  0.2 mg Intravenous PRN Kathryne Eriksson, NP   0.2 mg at 08/05/22 0345   Oral care mouth rinse  15 mL Mouth Rinse PRN Brand Males, MD       polyethylene glycol (MIRALAX / GLYCOLAX) packet 17 g  17 g Oral Daily Opyd, Ilene Qua, MD   17 g at 08/16/22 0846   senna (SENOKOT) tablet 17.2 mg  2 tablet Oral BID Opyd, Ilene Qua, MD   17.2 mg at 08/15/22 1042   sodium chloride flush (NS) 0.9 % injection 3 mL  3 mL Intravenous Q12H Opyd, Ilene Qua, MD   3 mL at 08/25/22 2048   sodium chloride flush  (NS) 0.9 % injection 3 mL  3 mL Intravenous PRN Opyd, Ilene Qua, MD         Discharge Medications: Please see discharge summary for a list of discharge medications.  Relevant Imaging Results:  Relevant Lab Results:   Additional Information SSN [id number]  Dessa Phi, RN

## 2022-08-26 NOTE — Progress Notes (Signed)
Progress Note    [name]   UDJ:[id number]  DOB: [birth year]  DOA: 08/02/2022     22 PCP: Heath Lark, MD  Initial CC: Fatigue, weakness  Hospital Course: [name] is a 61 year old female with PMH right breast cancer, chronic right pleural malignant effusion, chronic cancer related pain, malnutrition who presented from home with complaints of fatigue, generalized weakness, loss of appetite.   Chest x-ray concerning for increased right pleural effusion with complete opacification of the right hemithorax.  Important events: 11/4-admit to the hospital. 11/5-right-sided thoracentesis 800 mL removed, LDH 439, white cell 84%.  MSSA from the pleural fluid. 11/7-PCCM consulted and transferred to ICU, started on vasopressors with left groin central line.  Treated with BiPAP. 11/8-worsening pleural effusion, emergent thoracentesis done persistent staph 11/10-pigtail catheter was placed and currently getting Lasix through the tube. 11/12 transfer to [facility] service.   Remains on pigtail catheter with tPA.   Remains in the hospital, chest tube management, IV antibiotics.  Frailty and debility. Chest tube is actively draining. 11/22: Chest tube removed  Interval History:  No events overnight.  I removed 2 sutures from her chest tube site personally this morning.  Patient tolerated well. Still awaiting placement.  Assessment and Plan:  Recurrent right-sided pleural effusion - Secondary to breast cancer.  On presentation,Chest x-ray showed complete opacification of right hemithorax, Leftward shift of the trachea and mediastinal structures.  - s/p chest tube placement. Not a candidate for VATS.  PCCM team following.   - chest tube removed 11/22 -Repeat CXR 08/22/2022 showing stable bilateral effusions.  Outpatient follow-up with pulmonology planned with repeat imaging at that time.  Patient has been cleared for discharge from pulmonology standpoint - 6 week abx course recommended per  pulmonology; end date 12/20; will d/c on Augmentin at discharge; continue ancef for now   Septic shock - resolved MSSA empyema Continue current antibiotics.  Sepsis physiology has improved.  Fluid culture showed MSSA.  Repeat cultures on 11/14 with no growth  Physical deconditioning  -Overall deconditioning in setting of underlying malignancy and poor nutritional status - Evaluated by PT, recommendation is for SNF - Awaiting placement  Transient A-fib: Likely secondary to respiratory distress from empyema. CHA2DS2VAsc of 1 -Some RVR noted morning of 08/25/2022.  Start metoprolol   Breast cancer: Followed by Dr. Alvy Bimler.  On Verzenio and Arimidex at home. Continue anastrozole for now.   Chronic cancer rated pain: Continue oral Dilaudid as needed   Protein calorie malnutrition: On Remeron ,megace - RD following   Normocytic anemia: Currently hemoglobin stable.  Most likely associated with malignancy     Old records reviewed in assessment of this patient   DVT prophylaxis:  enoxaparin (LOVENOX) injection 40 mg Start: 08/06/22 2200 Place and maintain sequential compression device Start: 08/06/22 1943   Code Status:   Code Status: DNR  Mobility Assessment (last 72 hours)     Mobility Assessment     Row Name 08/26/22 0840 08/25/22 2000 08/25/22 1229 08/25/22 0815 08/24/22 2100   Does patient have an order for bedrest or is patient medically unstable No - Continue assessment No - Continue assessment -- No - Continue assessment No - Continue assessment   What is the highest level of mobility based on the progressive mobility assessment? Level 5 (Walks with assist in room/hall) - Balance while stepping forward/back and can walk in room with assist - Complete Level 5 (Walks with assist in room/hall) - Balance while stepping forward/back and can walk in room with  assist - Complete Level 5 (Walks with assist in room/hall) - Balance while stepping forward/back and can walk in room with  assist - Complete Level 5 (Walks with assist in room/hall) - Balance while stepping forward/back and can walk in room with assist - Complete Level 5 (Walks with assist in room/hall) - Balance while stepping forward/back and can walk in room with assist - Complete   Is the above level different from baseline mobility prior to current illness? -- Yes - Recommend PT order -- -- Yes - Recommend PT order    Row Name 08/24/22 0800 08/23/22 1930         Does patient have an order for bedrest or is patient medically unstable No - Continue assessment No - Continue assessment      What is the highest level of mobility based on the progressive mobility assessment? Level 5 (Walks with assist in room/hall) - Balance while stepping forward/back and can walk in room with assist - Complete Level 5 (Walks with assist in room/hall) - Balance while stepping forward/back and can walk in room with assist - Complete      Is the above level different from baseline mobility prior to current illness? Yes - Recommend PT order Yes - Recommend PT order               Barriers to discharge:  Disposition Plan:  SNF Status is: Inpt  Objective: Blood pressure 102/80, pulse (!) 102, temperature 98 F (36.7 C), temperature source Oral, resp. rate 20, height '5\' 6"'$  (1.676 m), weight 67.5 kg, SpO2 97 %.  Examination:  Physical Exam Constitutional:      Appearance: Normal appearance.  HENT:     Head: Normocephalic and atraumatic.     Mouth/Throat:     Mouth: Mucous membranes are moist.  Eyes:     Extraocular Movements: Extraocular movements intact.  Cardiovascular:     Rate and Rhythm: Normal rate and regular rhythm.  Pulmonary:     Comments: Coarse breath sounds Abdominal:     General: Bowel sounds are normal. There is no distension.     Palpations: Abdomen is soft.     Tenderness: There is no abdominal tenderness.  Musculoskeletal:        General: Normal range of motion.     Cervical back: Normal range of  motion.  Skin:    General: Skin is warm.  Neurological:     General: No focal deficit present.     Mental Status: She is alert.  Psychiatric:        Mood and Affect: Mood normal.      Consultants:  PCCM Oncology   Procedures:    Data Reviewed: No results found for this or any previous visit (from the past 24 hour(s)).   I have Reviewed nursing notes, Vitals, and Lab results since pt's last encounter. Pertinent lab results : see above I have reviewed the last note from staff over past 24 hours I have discussed pt's care plan and test results with nursing staff, case manager     LOS: 22 days   Dwyane Dee, MD [facility] 08/26/2022, 2:21 PM

## 2022-08-26 NOTE — TOC Progression Note (Addendum)
Transition of Care [facility]) - Progression Note    Patient Details  Name: [name] MRN: [id number] Date of Birth: May 19, 1961  Transition of Care [facility]) CM/SW Contact  Betina Puckett, Juliann Pulse, RN Phone Number: 08/26/2022, 12:33 PM  Clinical Narrative: spoke to patient about bed offers-[facility] only facility to accept;per patient request contacted [address] rep [facility]arr-unable to accept d/t insurance. [facility] [place] rep [place] [place] will review again to confirm if able to accept. -[facility] rep Kia accepted she will start auth.      Expected Discharge Plan: [place] Barriers to Discharge: Continued Medical Work up  Expected Discharge Plan and Services Expected Discharge Plan: [place]   Discharge Planning Services: CM Consult   Living arrangements for the past 2 months: Apartment                                       Social Determinants of Health (SDOH) Interventions    Readmission Risk Interventions    08/06/2022   10:01 AM  Readmission Risk Prevention Plan  Medication Review (RN Care Manager) Complete  PCP or Specialist appointment within 3-5 days of discharge Complete  HRI or Home Care Consult Complete  SW Recovery Care/Counseling Consult Complete  Palliative Care Screening Not [place] Complete

## 2022-08-26 NOTE — Plan of Care (Signed)
  Problem: Health Behavior/Discharge Planning: Goal: Ability to manage health-related needs will improve Outcome: Progressing   Problem: Coping: Goal: Level of anxiety will decrease Outcome: Progressing   Problem: Pain Managment: Goal: General experience of comfort will improve Outcome: Progressing   

## 2022-08-27 DIAGNOSIS — J910 Malignant pleural effusion: Secondary | ICD-10-CM | POA: Diagnosis not present

## 2022-08-27 DIAGNOSIS — E43 Unspecified severe protein-calorie malnutrition: Secondary | ICD-10-CM | POA: Diagnosis not present

## 2022-08-27 DIAGNOSIS — J90 Pleural effusion, not elsewhere classified: Secondary | ICD-10-CM | POA: Diagnosis not present

## 2022-08-27 DIAGNOSIS — C50911 Malignant neoplasm of unspecified site of right female breast: Secondary | ICD-10-CM | POA: Diagnosis not present

## 2022-08-27 NOTE — TOC Progression Note (Signed)
Transition of Care [facility]) - Progression Note    Patient Details  Name: [name] MRN: [id number] Date of Birth: 11-20-1960  Transition of Care [facility]) CM/SW Contact  Tirrell Buchberger, Juliann Pulse, RN Phone Number: 08/27/2022, 2:08 PM  Clinical Narrative:  Received call from [facility] had concerns from patient about the choices for SNF-explained that there was only 1 facility that accepted patient-voiced understanding-also spoke to patient/mother in rm to confirm understanding of options for choice-only 1 facility accepted to provide the services @ SNF-voiced understanding. [place] rep Kia initiated auth-awaiting auth.     Expected Discharge Plan: [place] Barriers to Discharge: Insurance Authorization  Expected Discharge Plan and Services Expected Discharge Plan: [place]   Discharge Planning Services: CM Consult   Living arrangements for the past 2 months: Apartment                                       Social Determinants of Health (SDOH) Interventions    Readmission Risk Interventions    08/06/2022   10:01 AM  Readmission Risk Prevention Plan  Medication Review (RN Care Manager) Complete  PCP or Specialist appointment within 3-5 days of discharge Complete  HRI or Home Care Consult Complete  SW Recovery Care/Counseling Consult Complete  Palliative Care Screening Not [place] Complete

## 2022-08-27 NOTE — Progress Notes (Addendum)
Physical Therapy Treatment Patient Details Name: [name] MRN: [id number] DOB: 05/22/1961 Today's Date: 08/27/2022   History of Present Illness s [name] is a 61 yo woman with a hx of metastatic breast cancer, HTN, admitted 11/4 with decreased appetite, poor po intake , weakness, lethargy,persistent pleural effusion despite thoracentesis, FTT, transfer to iCU 11/6 for decreased response and  low BP,  right CT placed .    PT Comments    Patient pleasant, eager, and motivated. Patient stated "I've been better. I was supposed to go home today." C/o of 9/10 pain in R METS CA pain at rest, no increase in pain throughout session. Required min guard for safety with increased time returning to bed; Required min guard for transfers from elevated surface. Pt was able to recall proper hand placement. Required min guard to ambulate 400 ft with RW for safety; min assist needed to ambulate 10 ft with no AD due to fwd side stepping to left. Cues need to correct ambulation "sorry I'm just excited." HR max 123 during ambulation. Monitored SpO2 at end of session due to patient presenting SOB. O2 93% "I'm okay I'm just excited that I'm getting closer to getting back to the way I was before." Pt will need ST Rehab at SNF to address mobility and functional decline prior to safely returning home.     Recommendations for follow up therapy are one component of a multi-disciplinary discharge planning process, led by the attending physician.  Recommendations may be updated based on patient status, additional functional criteria and insurance authorization.  Follow Up Recommendations  Skilled nursing-short term rehab (<3 hours/day) Can patient physically be transported by private vehicle: No   Assistance Recommended at Discharge Frequent or constant Supervision/Assistance  Patient can return home with the following A lot of help with walking and/or transfers;A lot of help with bathing/dressing/bathroom;Assistance  with cooking/housework;Assist for transportation   Equipment Recommendations  None recommended by PT    Recommendations for Other Services       Precautions / Restrictions Precautions Precautions: Fall Restrictions Weight Bearing Restrictions: No     Mobility  Bed Mobility Overal bed mobility: Needs Assistance Bed Mobility: Supine to Sit, Sit to Supine     Supine to sit: Min guard Sit to supine: Min guard, HOB elevated   General bed mobility comments: Min guard needed for safety with increased time returning to bed.    Transfers Overall transfer level: Needs assistance Equipment used: Rolling walker (2 wheels) Transfers: Sit to/from Stand Sit to Stand: Min guard, From elevated surface           General transfer comment: Required min guard for transfers from elevated surface. Pt was able to recall proper hand placement.    Ambulation/Gait Ambulation/Gait assistance: Min guard, Min assist Gait Distance (Feet): 400 Feet Assistive device: Rolling walker (2 wheels), None Gait Pattern/deviations: Step-through pattern, Narrow base of support, Trunk flexed Gait velocity: decreased     General Gait Details: Required min guard to ambulate 400 ft with RW for safety; min assist needed to ambulate 10 ft with no AD due to fwd side stepping to left. Cues need to correct ambulation "sorry I'm just excited"   Stairs             Wheelchair Mobility    Modified Rankin (Stroke Patients Only)       Balance  Cognition Arousal/Alertness: Awake/alert Behavior During Therapy: WFL for tasks assessed/performed Overall Cognitive Status: Within Functional Limits for tasks assessed                                 General Comments: AxO x 3 pleasant Lady, motivated, eager.        Exercises      General Comments        Pertinent Vitals/Pain Pain Assessment Pain Assessment: 0-10 Pain  Score: 9  Pain Location: R METS CA pain Pain Descriptors / Indicators: Grimacing Pain Intervention(s): Monitored during session, Limited activity within patient's tolerance    Home Living                          Prior Function            PT Goals (current goals can now be found in the care plan section) Acute Rehab PT Goals Patient Stated Goal: to get stronger PT Goal Formulation: With patient Time For Goal Achievement: 08/20/22 Potential to Achieve Goals: Good Progress towards PT goals: Progressing toward goals    Frequency    Min 2X/week      PT Plan Current plan remains appropriate    Co-evaluation              AM-PAC PT "6 Clicks" Mobility   Outcome Measure  Help needed turning from your back to your side while in a flat bed without using bedrails?: A Little Help needed moving from lying on your back to sitting on the side of a flat bed without using bedrails?: A Little Help needed moving to and from a bed to a chair (including a wheelchair)?: A Little Help needed standing up from a chair using your arms (e.g., wheelchair or bedside chair)?: A Little Help needed to walk in hospital room?: A Little Help needed climbing 3-5 steps with a railing? : A Lot 6 Click Score: 17    End of Session Equipment Utilized During Treatment: Gait belt Activity Tolerance: Patient tolerated treatment well;No increased pain Patient left: with call bell/phone within reach;in bed;with bed alarm set Nurse Communication: Mobility status PT Visit Diagnosis: Unsteadiness on feet (R26.81);Adult, failure to thrive (R62.7);Muscle weakness (generalized) (M62.81);Difficulty in walking, not elsewhere classified (R26.2)     Time: 1446-1500 PT Time Calculation (min) (ACUTE ONLY): 14 min  Charges:  $Gait Training: 8-22 mins                        Allyson Sabal 08/27/2022, 3:39 PM

## 2022-08-27 NOTE — Progress Notes (Signed)
Progress Note   Patient: [name] EHU:[id number] DOB: 05/28/1961 DOA: 08/02/2022     23 DOS: the patient was seen and examined on 08/27/2022   Brief hospital course: [name] is a 61 year old female with PMH right breast cancer, chronic right pleural malignant effusion, chronic cancer related pain, malnutrition who presented from home with complaints of fatigue, generalized weakness, loss of appetite.   Chest x-ray concerning for increased right pleural effusion with complete opacification of the right hemithorax.  Important events: 11/4-admit to the hospital. 11/5-right-sided thoracentesis 800 mL removed, LDH 439, white cell 84%.  MSSA from the pleural fluid. 11/7-PCCM consulted and transferred to ICU, started on vasopressors with left groin central line.  Treated with BiPAP. 11/8-worsening pleural effusion, emergent thoracentesis done persistent staph 11/10-pigtail catheter was placed and currently getting Lasix through the tube. 11/12 transfer to [facility] service.   Remains on pigtail catheter with tPA.   Remains in the hospital, chest tube management, IV antibiotics.  Frailty and debility. Chest tube is actively draining. 11/22: Chest tube removed  Assessment and Plan: Recurrent right-sided pleural effusion - Secondary to breast cancer.  On presentation,Chest x-ray showed complete opacification of right hemithorax, Leftward shift of the trachea and mediastinal structures.  - s/p chest tube placement. Not a candidate for VATS.  PCCM team following.   - chest tube removed 11/22 -Repeat CXR 08/22/2022 showing stable bilateral effusions.  Outpatient follow-up with pulmonology planned with repeat imaging at that time.  Patient has been cleared for discharge from pulmonology standpoint - 6 week abx course recommended per pulmonology; end date 12/20; will d/c on Augmentin at discharge; pt is conitinued on ancef at the moment   Septic shock - resolved MSSA empyema Continue current  antibiotics.  Sepsis physiology has improved.  Fluid culture showed MSSA.  Repeat cultures on 11/14 with no growth   Physical deconditioning  -Overall deconditioning in setting of underlying malignancy and poor nutritional status - Evaluated by PT, recommendation is for SNF - Awaiting placement   Transient A-fib: Likely secondary to respiratory distress from empyema. CHA2DS2VAsc of 1 -Some RVR noted morning of 08/25/2022.  Start metoprolol   Breast cancer: Followed by Dr. Alvy Bimler.  On Verzenio and Arimidex at home. Continue anastrozole for now.   Chronic cancer rated pain: Continue oral Dilaudid as needed   Protein calorie malnutrition: On Remeron ,megace - RD following   Normocytic anemia: Currently hemoglobin stable.  Most likely associated with malignancy      Subjective: Without complaints this AM. Eager to be discharged soon  Physical Exam: Vitals:   08/27/22 0451 08/27/22 0453 08/27/22 1108 08/27/22 1338  BP: 110/67  116/71 107/68  Pulse: (!) 104  (!) 112 97  Resp: 20   (!) 24  Temp: 98.4 F (36.9 C)   98.9 F (37.2 C)  TempSrc: Oral   Oral  SpO2: 96%   100%  Weight:  66.4 kg    Height:       General exam: Awake, laying in bed, in nad Respiratory system: Normal respiratory effort, no wheezing Cardiovascular system: regular rate, s1, s2 Gastrointestinal system: Soft, nondistended, positive BS Central nervous system: CN2-12 grossly intact, strength intact Extremities: Perfused, no clubbing Skin: Normal skin turgor, no notable skin lesions seen Psychiatry: Mood normal // no visual hallucinations   Data Reviewed:  There are no new results to review at this time.  Family Communication: Pt in room, family at bedside  Disposition: Status is: Inpatient Remains inpatient appropriate because: Severity of illness  Planned Discharge Destination: Skilled nursing facility    Author: Marylu Lund, MD 08/27/2022 3:03 PM  For on call review [email or web address].

## 2022-08-27 NOTE — Progress Notes (Signed)
Mobility Specialist - Progress Note   08/27/22 1100  Mobility  Activity Transferred from bed to chair  Level of Assistance Contact guard assist, steadying assist  Assistive Device Front wheel walker  Distance Ambulated (ft) 5 ft  Range of Motion/Exercises Active  Activity Response Tolerated well  Mobility Referral Yes  $Mobility charge 1 Mobility   Pt was found in bed and agreeable to transfer to recliner chair. At EOS was left on recliner chair with RN in room and mother.  Ferd Hibbs Mobility Specialist

## 2022-08-28 DIAGNOSIS — J90 Pleural effusion, not elsewhere classified: Secondary | ICD-10-CM | POA: Diagnosis not present

## 2022-08-28 DIAGNOSIS — E43 Unspecified severe protein-calorie malnutrition: Secondary | ICD-10-CM | POA: Diagnosis not present

## 2022-08-28 DIAGNOSIS — J910 Malignant pleural effusion: Secondary | ICD-10-CM | POA: Diagnosis not present

## 2022-08-28 DIAGNOSIS — C50911 Malignant neoplasm of unspecified site of right female breast: Secondary | ICD-10-CM | POA: Diagnosis not present

## 2022-08-28 LAB — CBC
HCT: 27.8 % — ABNORMAL LOW (ref 36.0–46.0)
Hemoglobin: 8.2 g/dL — ABNORMAL LOW (ref 12.0–15.0)
MCH: 26.5 pg (ref 26.0–34.0)
MCHC: 29.5 g/dL — ABNORMAL LOW (ref 30.0–36.0)
MCV: 90 fL (ref 80.0–100.0)
Platelets: 219 10*3/uL (ref 150–400)
RBC: 3.09 MIL/uL — ABNORMAL LOW (ref 3.87–5.11)
RDW: 17.6 % — ABNORMAL HIGH (ref 11.5–15.5)
WBC: 4.9 10*3/uL (ref 4.0–10.5)
nRBC: 0 % (ref 0.0–0.2)

## 2022-08-28 LAB — COMPREHENSIVE METABOLIC PANEL
ALT: 8 U/L (ref 0–44)
AST: 23 U/L (ref 15–41)
Albumin: 2 g/dL — ABNORMAL LOW (ref 3.5–5.0)
Alkaline Phosphatase: 45 U/L (ref 38–126)
Anion gap: 8 (ref 5–15)
BUN: 10 mg/dL (ref 8–23)
CO2: 27 mmol/L (ref 22–32)
Calcium: 7.5 mg/dL — ABNORMAL LOW (ref 8.9–10.3)
Chloride: 106 mmol/L (ref 98–111)
Creatinine, Ser: 0.44 mg/dL (ref 0.44–1.00)
GFR, Estimated: >60 mL/min (ref >60)
Glucose, Bld: 160 mg/dL — ABNORMAL HIGH (ref 70–99)
Potassium: 3.7 mmol/L (ref 3.5–5.1)
Sodium: 141 mmol/L (ref 135–145)
Total Bilirubin: 0.3 mg/dL (ref 0.3–1.2)
Total Protein: 5 g/dL — ABNORMAL LOW (ref 6.5–8.1)

## 2022-08-28 MED ORDER — AMOXICILLIN-POT CLAVULANATE 875-125 MG PO TABS
1.0000 | ORAL_TABLET | Freq: Two times a day (BID) | ORAL | Status: DC
  Administered 2022-08-28 – 2022-08-30 (×4): 1 via ORAL
  Filled 2022-08-28 (×4): qty 1

## 2022-08-28 MED ORDER — AMOXICILLIN-POT CLAVULANATE 875-125 MG PO TABS: 1.0000 | ORAL_TABLET | Freq: Two times a day (BID) | ORAL | Status: DC

## 2022-08-28 NOTE — Progress Notes (Signed)
Progress Note   Patient: [name] ONG:[id number] DOB: [birth year] DOA: 08/02/2022     24 DOS: the patient was seen and examined on 08/28/2022   Brief hospital course: [name] is a 61 year old female with PMH right breast cancer, chronic right pleural malignant effusion, chronic cancer related pain, malnutrition who presented from home with complaints of fatigue, generalized weakness, loss of appetite.   Chest x-ray concerning for increased right pleural effusion with complete opacification of the right hemithorax.  Important events: 11/4-admit to the hospital. 11/5-right-sided thoracentesis 800 mL removed, LDH 439, white cell 84%.  MSSA from the pleural fluid. 11/7-PCCM consulted and transferred to ICU, started on vasopressors with left groin central line.  Treated with BiPAP. 11/8-worsening pleural effusion, emergent thoracentesis done persistent staph 11/10-pigtail catheter was placed and currently getting Lasix through the tube. 11/12 transfer to [facility] service.   Remains on pigtail catheter with tPA.   Remains in the hospital, chest tube management, IV antibiotics.  Frailty and debility. Chest tube is actively draining. 11/22: Chest tube removed  Assessment and Plan: Recurrent right-sided pleural effusion - Secondary to breast cancer.  On presentation,Chest x-ray showed complete opacification of right hemithorax, Leftward shift of the trachea and mediastinal structures.  - s/p chest tube placement. Not a candidate for VATS.  PCCM team following.   - chest tube removed 11/22 -Repeat CXR 08/22/2022 showing stable bilateral effusions.  Outpatient follow-up with pulmonology planned with repeat imaging at that time.  Patient has been cleared for discharge from pulmonology standpoint - 6 week abx course recommended per pulmonology; end date 12/20; will d/c on Augmentin at discharge; pt is conitinued on ancef at the moment   Septic shock - resolved MSSA empyema Continue current  antibiotics.  Sepsis physiology has improved.  Fluid culture showed MSSA.  Repeat cultures on 11/14 with no growth   Physical deconditioning  -Overall deconditioning in setting of underlying malignancy and poor nutritional status - Evaluated by PT, recommendation is for SNF - Placement still pending. TOC is following   Transient A-fib: Likely secondary to respiratory distress from empyema. CHA2DS2VAsc of 1 -Some RVR noted morning of 08/25/2022.  Started metoprolol   Breast cancer: Followed by Dr. Alvy Bimler.  On Verzenio and Arimidex at home. Continue anastrozole for now.   Chronic cancer rated pain: Continue oral Dilaudid as needed   Protein calorie malnutrition: On Remeron ,megace - RD following   Normocytic anemia: Currently hemoglobin stable.  Most likely associated with malignancy      Subjective: Feeling as if she were "stuck" in hospital and really hoping for discharge soon  Physical Exam: Vitals:   08/28/22 0416 08/28/22 0516 08/28/22 0909 08/28/22 1133  BP:  114/65 125/81 108/71  Pulse:  (!) 108 98 93  Resp:  20  20  Temp:  99.5 F (37.5 C) 99.5 F (37.5 C) 100 F (37.8 C)  TempSrc:  Oral Oral Oral  SpO2:  97%  97%  Weight: 68.5 kg     Height:       General exam: Conversant, in no acute distress Respiratory system: normal chest rise, clear, no audible wheezing Cardiovascular system: regular rhythm, s1-s2 Gastrointestinal system: Nondistended, nontender, pos BS Central nervous system: No seizures, no tremors Extremities: No cyanosis, no joint deformities Skin: No rashes, no pallor Psychiatry: Affect normal // no auditory hallucinations   Data Reviewed:  Labs reviewed: Na 141, K 3.7, Cr 0.44, Hgb 8.2  Family Communication: Pt in room, family not at bedside  Disposition: Status is:  Inpatient Remains inpatient appropriate because: Severity of illness  Planned Discharge Destination: Skilled nursing facility    Author: Marylu Lund, MD 08/28/2022 1:48  PM  For on call review [email or web address].

## 2022-08-28 NOTE — Progress Notes (Signed)
Mobility Specialist - Progress Note   08/28/22 1049  Mobility  Activity Ambulated with assistance in hallway;Ambulated independently in hallway;Ambulated independently in room  Level of Assistance Standby assist, set-up cues, supervision of patient - no hands on  Assistive Device Front wheel walker  Distance Ambulated (ft) 500 ft  Activity Response Tolerated well  Mobility Referral Yes  $Mobility charge 1 Mobility   Pt received in bed and agreeable/eager for mobility. Pt ambulated 419f w/ RW. Pt stated that she wanted to try ambulating IND in hallway back to room. Let the pt ambulate 217fback to room & allowed her to move her personal belongings around to give her some sense of independence. Pt was really excited about all of this. Pt had some SOB after session but stated she felt fine. No complaints during mobility. Pt to recliner after session with all needs met & call bell in reach.      [facility]

## 2022-08-28 NOTE — Progress Notes (Signed)
Mobility Specialist - Progress Note   08/28/22 1449  Mobility  Activity Ambulated with assistance in hallway  Level of Assistance Standby assist, set-up cues, supervision of patient - no hands on  Assistive Device Front wheel walker  Distance Ambulated (ft) 450 ft  Activity Response Tolerated well  Mobility Referral Yes  $Mobility charge 1 Mobility   Pt received in recliner and agreeable/eager to mobilize. Pt vocalized wanting to do some theraband exercises in the afternoon. Pt tolerated some arm exercises on L arm, but no exercises on R arm due to swelling. Pt also did 5x leg extensions on each leg. Pt ambulated 430f w/ RW & 110fback to bed IND. Pt also was able to organize bed all on her own. Pt was very excited about slowly regaining some of her strength/independence back. Pt wound bandages came off during ambulation. Nurse notified. No complaints during session. Pt to bed after session with all needs met, call bell in reach & awaiting IV team.    During mobility:  117bpm HR Post-mobility: 70bpm  HR  MaSet designer

## 2022-08-29 DIAGNOSIS — J90 Pleural effusion, not elsewhere classified: Secondary | ICD-10-CM | POA: Diagnosis not present

## 2022-08-29 DIAGNOSIS — J910 Malignant pleural effusion: Secondary | ICD-10-CM | POA: Diagnosis not present

## 2022-08-29 DIAGNOSIS — C50911 Malignant neoplasm of unspecified site of right female breast: Secondary | ICD-10-CM | POA: Diagnosis not present

## 2022-08-29 NOTE — Plan of Care (Signed)
  Problem: Activity: Goal: Risk for activity intolerance will decrease Outcome: Progressing   Problem: Nutrition: Goal: Adequate nutrition will be maintained Outcome: Progressing   Problem: Elimination: Goal: Will not experience complications related to bowel motility Outcome: Progressing Goal: Will not experience complications related to urinary retention Outcome: Progressing   

## 2022-08-29 NOTE — Progress Notes (Signed)
Progress Note   Patient: [name] ZOX:[id number] DOB: April 16, 1961 DOA: 08/02/2022     25 DOS: the patient was seen and examined on 08/29/2022   Brief hospital course: Ms. Labarbera is a 61 year old female with PMH right breast cancer, chronic right pleural malignant effusion, chronic cancer related pain, malnutrition who presented from home with complaints of fatigue, generalized weakness, loss of appetite.   Chest x-ray concerning for increased right pleural effusion with complete opacification of the right hemithorax.  Important events: 11/4-admit to the hospital. 11/5-right-sided thoracentesis 800 mL removed, LDH 439, white cell 84%.  MSSA from the pleural fluid. 11/7-PCCM consulted and transferred to ICU, started on vasopressors with left groin central line.  Treated with BiPAP. 11/8-worsening pleural effusion, emergent thoracentesis done persistent staph 11/10-pigtail catheter was placed and currently getting Lasix through the tube. 11/12 transfer to [facility].   Remains on pigtail catheter with tPA.   Remains in the hospital, chest tube management, IV antibiotics.  Frailty and debility. Chest tube is actively draining. 11/22: Chest tube removed  Assessment and Plan: Recurrent right-sided pleural effusion - Secondary to breast cancer.  On presentation,Chest x-ray showed complete opacification of right hemithorax, Leftward shift of the trachea and mediastinal structures.  - s/p chest tube placement. Not a candidate for VATS.  PCCM team following.   - chest tube removed 11/22 -Repeat CXR 08/22/2022 showing stable bilateral effusions.  Outpatient follow-up with pulmonology planned with repeat imaging at that time.  Patient has been cleared for discharge from pulmonology standpoint - 6 week abx course recommended per pulmonology; end date 12/20; have transitioned to Augmentin   Septic shock - resolved MSSA empyema Continue current antibiotics.  Sepsis physiology has improved.  Fluid  culture showed MSSA.  Repeat cultures on 11/14 with no growth   Physical deconditioning  -Overall deconditioning in setting of underlying malignancy and poor nutritional status - Evaluated by PT, recommendation is for SNF - Placement still pending. TOC is following   Transient A-fib: Likely secondary to respiratory distress from empyema. CHA2DS2VAsc of 1 -Some RVR noted morning of 08/25/2022.  Started metoprolol   Breast cancer: Followed by Dr. Alvy Bimler.  On Verzenio and Arimidex at home. Continue anastrozole for now.   Chronic cancer rated pain: Continue oral Dilaudid as needed   Protein calorie malnutrition: On Remeron ,megace - RD following   Normocytic anemia: Currently hemoglobin stable.  Most likely associated with malignancy      Subjective: Extremely eager for discharge soon  Physical Exam: Vitals:   08/29/22 0442 08/29/22 0813 08/29/22 1306 08/29/22 1448  BP:  116/76 (!) 99/55 109/88  Pulse:  (!) 105 84 (!) 105  Resp:   20   Temp:   98.2 F (36.8 C) 98.7 F (37.1 C)  TempSrc:   Oral Oral  SpO2:  98% 96% 97%  Weight: 66.5 kg     Height:       General exam: Awake, laying in bed, in nad Respiratory system: Normal respiratory effort, no wheezing Cardiovascular system: regular rate, s1, s2 Gastrointestinal system: Soft, nondistended, positive BS Central nervous system: CN2-12 grossly intact, strength intact Extremities: Perfused, no clubbing Skin: Normal skin turgor, no notable skin lesions seen Psychiatry: Mood normal // no visual hallucinations  \ Data Reviewed:  There are no new results to review at this time.  Family Communication: Pt in room, family not at bedside  Disposition: Status is: Inpatient Remains inpatient appropriate because: Severity of illness  Planned Discharge Destination: Skilled nursing facility  Author: Marylu Lund, MD 08/29/2022 3:29 PM  For on call review [email or web address].

## 2022-08-29 NOTE — Progress Notes (Signed)
Mobility Specialist - Progress Note   08/29/22 0915  Mobility  Activity Ambulated with assistance in hallway  Level of Assistance Standby assist, set-up cues, supervision of patient - no hands on  Assistive Device Front wheel walker  Distance Ambulated (ft) 490 ft  Range of Motion/Exercises Active  Activity Response Tolerated well  Mobility Referral Yes  $Mobility charge 1 Mobility   Pt was found in bed and eager to get up and ambulate. Pt had no complaints during ambulation and at EOS returned to recliner chair with necessities in reach.   Ferd Hibbs Mobility Specialist

## 2022-08-29 NOTE — TOC Progression Note (Signed)
Transition of Care [facility]) - Progression Note    Patient Details  Name: [name] MRN: [id number] Date of Birth: 05/23/1961  [facility]) CM/SW Contact  Lithzy Bernard, Juliann Pulse, RN Phone Number: 08/29/2022, 12:08 PM  Clinical Narrative: Awaiting auth from [facility] rep.      Expected Discharge Plan: [place] Barriers to Discharge: Insurance Authorization  Expected Discharge Plan and Services Expected Discharge Plan: [place]   Discharge Planning Services: CM Consult   Living arrangements for the past 2 months: Apartment                                       Social Determinants of Health (SDOH) Interventions    Readmission Risk Interventions    08/06/2022   10:01 AM  Readmission Risk Prevention Plan  Medication Review (RN Care Manager) Complete  PCP or Specialist appointment within 3-5 days of discharge Complete  HRI or Home Care Consult Complete  SW Recovery Care/Counseling Consult Complete  Palliative Care Screening Not [place] Complete

## 2022-08-29 NOTE — Progress Notes (Signed)
Mobility Specialist - Progress Note   08/29/22 1600  Mobility  Activity Ambulated with assistance in hallway  Level of Assistance Contact guard assist, steadying assist  Assistive Device None  Distance Ambulated (ft) 200 ft  Range of Motion/Exercises Active  Activity Response Tolerated well  Mobility Referral Yes  $Mobility charge 1 Mobility   Pt was found in bed and eager to ambulate. Had no complaints during ambulation and at EOS returned to bed with all necessities in reach.  Ferd Hibbs Mobility Specialist

## 2022-08-29 NOTE — Progress Notes (Signed)
Physical Therapy Treatment Patient Details Name: [name] MRN: [id number] DOB: March 02, 1961 Today's Date: 08/29/2022   History of Present Illness s Pinch is a 61 yo woman with a hx of metastatic breast cancer, HTN, admitted 11/4 with decreased appetite, poor po intake , weakness, lethargy,persistent pleural effusion despite thoracentesis, FTT, transfer to iCU 11/6 for decreased response and  low BP,  right CT placed .    PT Comments    Patient very motivated/eager to ambulate. C/o of 8/10 R METS CA pain, no increase in pain throughout session. Patient found in recliner prior to session, returned patient to recliner. Required min guard for transfers from recliner for safety with no AD. Required min guard to ambulate 400 ft w/ no AD for safety. Returned patient to recliner due to fatigue. "I'm all alright. I get like this when I'm excited." RA average 93-94%. Highest HR 119 during ambulation. Pt also participated in exercises such as SLR, knee press, hip abduction, and ankle pumps in long sitting. Addressed questions regarding physical therapy at [facility]. Pt will need ST Rehab at SNF to address mobility and functional decline prior to safely returning home.     Recommendations for follow up therapy are one component of a multi-disciplinary discharge planning process, led by the attending physician.  Recommendations may be updated based on patient status, additional functional criteria and insurance authorization.  Follow Up Recommendations  Skilled nursing-short term rehab (<3 hours/day) Can patient physically be transported by private vehicle: No   Assistance Recommended at Discharge Frequent or constant Supervision/Assistance  Patient can return home with the following A lot of help with walking and/or transfers;A lot of help with bathing/dressing/bathroom;Assistance with cooking/housework;Assist for transportation   Equipment Recommendations  None recommended by PT     Recommendations for Other Services       Precautions / Restrictions Precautions Precautions: Fall Restrictions Weight Bearing Restrictions: No     Mobility  Bed Mobility               General bed mobility comments: Patient found in recliner prior to session.    Transfers Overall transfer level: Needs assistance Equipment used: None Transfers: Sit to/from Stand Sit to Stand: Min guard           General transfer comment: Required min guard for transfers from recliner for safety with no AD.    Ambulation/Gait Ambulation/Gait assistance: Min guard Gait Distance (Feet): 350 Feet Assistive device: None Gait Pattern/deviations: Step-through pattern, Narrow base of support, Trunk flexed Gait velocity: decreased     General Gait Details: Required min guard to ambulate 400 ft w/ no AD for safety. Returned patient to recliner due to fatigue. "I'm all alright. I get like this when I'm excited." RA average 93-94%. Highest HR 119 during ambulation.   Stairs             Wheelchair Mobility    Modified Rankin (Stroke Patients Only)       Balance                                            Cognition Arousal/Alertness: Awake/alert Behavior During Therapy: WFL for tasks assessed/performed                                   General Comments: AxO  x 3 pleasant Lady, motivated, eager.        Exercises Total Joint Exercises Ankle Circles/Pumps: AROM, 10 reps, Both Quad Sets: AROM, Both, 5 reps Hip ABduction/ADduction: AROM, 10 reps, Both Straight Leg Raises: AROM, Both, 5 reps    General Comments        Pertinent Vitals/Pain Pain Assessment Pain Assessment: 0-10 Pain Score: 8  Pain Location: R METS CA pain Pain Descriptors / Indicators: Grimacing Pain Intervention(s): Monitored during session, Limited activity within patient's tolerance    Home Living                          Prior Function             PT Goals (current goals can now be found in the care plan section) Acute Rehab PT Goals Patient Stated Goal: to get stronger PT Goal Formulation: With patient Time For Goal Achievement: 09/11/22 Potential to Achieve Goals: Good Progress towards PT goals: Progressing toward goals    Frequency    Min 2X/week      PT Plan Current plan remains appropriate    Co-evaluation              AM-PAC PT "6 Clicks" Mobility   Outcome Measure  Help needed turning from your back to your side while in a flat bed without using bedrails?: A Little Help needed moving from lying on your back to sitting on the side of a flat bed without using bedrails?: A Little Help needed moving to and from a bed to a chair (including a wheelchair)?: A Little Help needed standing up from a chair using your arms (e.g., wheelchair or bedside chair)?: A Little Help needed to walk in hospital room?: A Little Help needed climbing 3-5 steps with a railing? : A Lot 6 Click Score: 17    End of Session Equipment Utilized During Treatment: Gait belt Activity Tolerance: Patient tolerated treatment well;No increased pain Patient left: with call bell/phone within reach;in chair Nurse Communication: Mobility status PT Visit Diagnosis: Unsteadiness on feet (R26.81);Adult, failure to thrive (R62.7);Muscle weakness (generalized) (M62.81);Difficulty in walking, not elsewhere classified (R26.2)     Time: 1350-1405 PT Time Calculation (min) (ACUTE ONLY): 15 min  Charges:  $Gait Training: 8-22 mins                        [name] 08/29/2022, 2:57 PM

## 2022-08-30 DIAGNOSIS — J910 Malignant pleural effusion: Secondary | ICD-10-CM | POA: Diagnosis not present

## 2022-08-30 DIAGNOSIS — C50911 Malignant neoplasm of unspecified site of right female breast: Secondary | ICD-10-CM | POA: Diagnosis not present

## 2022-08-30 DIAGNOSIS — J90 Pleural effusion, not elsewhere classified: Secondary | ICD-10-CM | POA: Diagnosis not present

## 2022-08-30 DIAGNOSIS — E43 Unspecified severe protein-calorie malnutrition: Secondary | ICD-10-CM | POA: Diagnosis not present

## 2022-08-30 MED ORDER — HYDROMORPHONE HCL 4 MG PO TABS: 4.0000 mg | ORAL_TABLET | ORAL | 0 refills | Status: DC | PRN

## 2022-08-30 MED ORDER — AMOXICILLIN-POT CLAVULANATE 875-125 MG PO TABS: 1.0000 | ORAL_TABLET | Freq: Two times a day (BID) | ORAL | 0 refills | Status: AC

## 2022-08-30 MED ORDER — METOPROLOL TARTRATE 25 MG PO TABS: 25.0000 mg | ORAL_TABLET | Freq: Two times a day (BID) | ORAL | 0 refills | Status: DC

## 2022-08-30 NOTE — Progress Notes (Signed)
Mobility Specialist - Progress Note   08/30/22 0929  Mobility  Activity Ambulated with assistance in hallway  Level of Assistance Contact guard assist, steadying assist  Assistive Device None  Distance Ambulated (ft) 250 ft  Range of Motion/Exercises Active  Activity Response Tolerated well  Mobility Referral Yes  $Mobility charge 1 Mobility   Pt was found in bed and agreeable to ambulate. Had no complaints and at EOS returned to recliner chair with necessities in reach. RN notified pt was requesting her medication.  Ferd Hibbs Mobility Specialist

## 2022-08-30 NOTE — Progress Notes (Signed)
Pt stated she wanted a bath prior to discharge, but when offered to have NT clean up, she refused & stated she will wait for her family.

## 2022-08-30 NOTE — TOC Transition Note (Signed)
Transition of Care [facility]) - CM/SW Discharge Note   Patient Details  Name: [name] MRN: [id number] Date of Birth: 03-24-1961  [facility]) CM/SW Contact:  Vassie Moselle, [place] Phone Number: 08/30/2022, 1:47 PM   Clinical Narrative:    Pt is to transfer to [facility] for SNF placement. Pt will be going to room 116. RN to call report to [phone]. Pt will be transported to facility via EMS.    Final next level of care: Skilled Nursing Facility Barriers to Discharge: Barriers Resolved   Patient Goals and CMS Choice Patient states their goals for this hospitalization and ongoing recovery are:: to go home and be well CMS Medicare.gov Compare Post Acute Care list provided to:: Patient Choice offered to / list presented to : Patient  Discharge Placement PASRR number recieved: 08/26/22            Patient chooses bed at: [facility] Patient to be transferred to facility by: [place] Name of family member notified: Patient Patient and family notified of of transfer: 08/30/22  Discharge Plan and Services   Discharge Planning Services: CM Consult            DME Arranged: N/A DME Agency: NA                  Social Determinants of Health ([place]) Interventions     Readmission Risk Interventions    08/30/2022    1:46 PM 08/06/2022   10:01 AM  Readmission Risk Prevention Plan  Transportation Screening Complete   PCP or Specialist Appt within 3-5 Days Complete   HRI or [place] Complete   Social Work Consult for [employer] Planning/Counseling Complete   Palliative Care Screening Not Applicable   Medication Review Press photographer) Complete Complete  PCP or Specialist appointment within 3-5 days of discharge  Complete  HRI or [place]  Complete  SW Recovery Care/Counseling Consult  Complete  Palliative Care Screening  Not [place]  Complete

## 2022-08-30 NOTE — Progress Notes (Signed)
[facility], spoke with Mingo Amber RN for report. Provided most recent VS & BM which was yesterday. Reviewed general head to toe assessment. Mingo Amber verbalized having no further questions @ this time.

## 2022-08-30 NOTE — Discharge Summary (Addendum)
Physician Discharge Summary   Patient: [name] MRN: [id number] DOB: 12/23/1960  Admit date:     08/02/2022  Discharge date: 08/30/22  Discharge Physician: Marylu Lund   PCP: Heath Lark, MD   Recommendations at discharge:    Follow up with PCP in 1-2 weeks Follow up with Dr. Alvy Bimler as scheduled  Discharge Diagnoses: Principal Problem:   Pleural effusion on right Active Problems:   Breast cancer, stage 4, right (HCC)   Cancer associated pain   Physical deconditioning   Protein calorie malnutrition (HCC)   Pleural effusion, right   Malignant pleural effusion   Need for emotional support   Constipation   Palliative care encounter   Protein-calorie malnutrition, severe   Empyema, right ([place])   Need for management of chest tube   MSSA (methicillin susceptible Staphylococcus aureus) infection  Resolved Problems:   * No resolved hospital problems. [facility] Course: [name] is a 61 year old female with PMH right breast cancer, chronic right pleural malignant effusion, chronic cancer related pain, malnutrition who presented from home with complaints of fatigue, generalized weakness, loss of appetite.   Chest x-ray concerning for increased right pleural effusion with complete opacification of the right hemithorax.  Important events: 11/4-admit to the hospital. 11/5-right-sided thoracentesis 800 mL removed, LDH 439, white cell 84%.  MSSA from the pleural fluid. 11/7-PCCM consulted and transferred to ICU, started on vasopressors with left groin central line.  Treated with BiPAP. 11/8-worsening pleural effusion, emergent thoracentesis done persistent staph 11/10-pigtail catheter was placed and currently getting Lasix through the tube. 11/12 transfer to [facility] service.   Remains on pigtail catheter with tPA.   Remains in the hospital, chest tube management, IV antibiotics.  Frailty and debility. Chest tube is actively draining. 11/22: Chest tube removed  Assessment and  Plan: Recurrent right-sided pleural effusion - Secondary to breast cancer.  On presentation,Chest x-ray showed complete opacification of right hemithorax, Leftward shift of the trachea and mediastinal structures.  - s/p chest tube placement. Not a candidate for VATS.  PCCM team following.   - chest tube removed 11/22 -Repeat CXR 08/22/2022 showing stable bilateral effusions.  Outpatient follow-up with pulmonology planned with repeat imaging at that time.  Patient has been cleared for discharge from pulmonology standpoint - 6 week abx course recommended per pulmonology; end date 12/20; have transitioned to Augmentin   Septic shock - resolved MSSA empyema Continue current antibiotics.  Sepsis physiology has improved.  Fluid culture showed MSSA.  Repeat cultures on 11/14 with no growth   Physical deconditioning  -Overall deconditioning in setting of underlying malignancy and poor nutritional status - Evaluated by PT, recommendation is for SNF   Transient A-fib: Likely secondary to respiratory distress from empyema. CHA2DS2VAsc of 1 -Some RVR noted morning of 08/25/2022.  Started metoprolol, remained stable   Breast cancer: Followed by Dr. Alvy Bimler.  On Verzenio and Arimidex at home. Continue anastrozole for now per Oncology recs   Chronic cancer rated pain: Continue oral Dilaudid as needed   Protein calorie malnutrition: On Remeron ,megace - RD following   Normocytic anemia: Currently hemoglobin stable.  Most likely associated with malignancy        Consultants: Oncology Procedures performed:   Disposition: Skilled nursing facility Diet recommendation:  Regular diet DISCHARGE MEDICATION: Allergies as of 08/30/2022   No Known Allergies      Medication List     STOP taking these medications    metroNIDAZOLE 1 % gel Commonly known as: Metrogel   Verzenio  100 MG tablet Generic drug: abemaciclib       TAKE these medications    amoxicillin-clavulanate 875-125 MG  tablet Commonly known as: AUGMENTIN Take 1 tablet by mouth every 12 (twelve) hours for 18 days.   anastrozole 1 MG tablet Commonly known as: ARIMIDEX Take 1 tablet (1 mg total) by mouth daily.   calcium carbonate 500 MG chewable tablet Commonly known as: TUMS - dosed in mg elemental calcium Chew 1 tablet by mouth 2 (two) times daily.   cholecalciferol 25 MCG (1000 UNIT) tablet Commonly known as: VITAMIN D3 Take 2,000 Units by mouth daily.   famotidine 20 MG tablet Commonly known as: PEPCID Take 1 tablet (20 mg total) by mouth 2 (two) times daily.   HYDROmorphone 4 MG tablet Commonly known as: DILAUDID Take 1 tablet (4 mg total) by mouth every 4 (four) hours as needed for moderate pain. What changed:  medication strength how much to take when to take this   magnesium oxide 400 (240 Mg) MG tablet Commonly known as: MAG-OX Take 1 tablet (400 mg total) by mouth daily.   megestrol 40 MG tablet Commonly known as: MEGACE Take 40 mg by mouth 2 (two) times daily.   metoprolol tartrate 25 MG tablet Commonly known as: LOPRESSOR Take 1 tablet (25 mg total) by mouth 2 (two) times daily.   mirtazapine 7.5 MG tablet Commonly known as: REMERON Take 1 tablet (7.5 mg total) by mouth at bedtime.   polyethylene glycol 17 g packet Commonly known as: MIRALAX / GLYCOLAX Take 17 g by mouth daily.   senna 8.6 MG tablet Commonly known as: SENOKOT Take 2 tablets by mouth 2 (two) times daily.        Follow-up Information     Heath Lark, MD Follow up in 1 week(s).   Specialty: Hematology and Oncology Why: Hospital follow up Contact information: [place] [address] [phone]                Discharge Exam: Danley Danker Weights   08/28/22 0416 08/29/22 0442 08/30/22 0500  Weight: 68.5 kg 66.5 kg 66.6 kg   General exam: Awake, laying in bed, in nad Respiratory system: Normal respiratory effort, no wheezing Cardiovascular system: regular  rate, s1, s2 Gastrointestinal system: Soft, nondistended, positive BS Central nervous system: CN2-12 grossly intact, strength intact Extremities: Perfused, no clubbing Skin: Normal skin turgor, no notable skin lesions seen Psychiatry: Mood normal // no visual hallucinations   Condition at discharge: fair  The results of significant diagnostics from this hospitalization (including imaging, microbiology, ancillary and laboratory) are listed below for reference.   Imaging Studies: DG CHEST PORT 1 VIEW  Result Date: 08/22/2022 CLINICAL DATA:  A 61 year old female presents for evaluation of pleural effusion. History of hypertension. EXAM: PORTABLE CHEST 1 VIEW COMPARISON:  August 21, 2022. FINDINGS: Image rotated to the RIGHT. Accounting for this cardiomediastinal contours are stable, bilateral pleural effusions persist. Loculated appearance of RIGHT-sided effusion. Bibasilar airspace disease is also unchanged. Loculated appearing lucencies at the RIGHT lung base similar to previous imaging. Skeletal structures are unremarkable to the extent evaluated on limited evaluation. EKG leads project over the chest. IMPRESSION: Stable bilateral pleural effusions and bibasilar airspace disease. Stable appearance of what is likely a loculated hydropneumothorax following chest tube removal. Could consider CT of the chest for further evaluation as warranted. Electronically Signed   By: Zetta Bills M.D.   On: 08/22/2022 08:51   DG CHEST PORT 1 VIEW  Result Date:  08/21/2022 CLINICAL DATA:  Encounter for chest tube removal. EXAM: PORTABLE CHEST 1 VIEW COMPARISON:  08/20/2022 FINDINGS: Persistent densities in lower lungs bilaterally. Basilar chest densities have minimally changed. No significant change in the cardiomediastinal silhouette. Subtle lucent areas at the right lung base are concerning for residual pockets of pleural gas. There may also be a small amount of pleural air at the right lung apex.  IMPRESSION: 1. Patchy lucent areas at the right lung base and concern for small amount of pleural air near the right lung apex. Findings likely represent residual complex right hydropneumothorax. This could be better characterized with CT. 2. Persistent densities at the left lung base are most compatible with pleural fluid and atelectasis. Electronically Signed   By: Markus Daft M.D.   On: 08/21/2022 08:51   DG CHEST PORT 1 VIEW  Result Date: 08/20/2022 CLINICAL DATA:  Encounter for chest tube removal. EXAM: PORTABLE CHEST 1 VIEW COMPARISON:  Chest radiograph 08/20/2022 at 4:20 a.m. FINDINGS: The patient remains rotated to the right with unchanged cardiomediastinal silhouette. The right-sided chest tube has been removed. No definite pneumothorax is identified. Bilateral pleural effusions and bibasilar atelectasis or consolidation are unchanged. IMPRESSION: 1. Interval right chest tube removal. No definite pneumothorax. 2. Unchanged pleural effusions and bibasilar atelectasis or consolidation. Electronically Signed   By: Logan Bores M.D.   On: 08/20/2022 11:12   DG CHEST PORT 1 VIEW  Result Date: 08/20/2022 CLINICAL DATA:  Pleural effusion. EXAM: PORTABLE CHEST 1 VIEW COMPARISON:  August 19, 2022. FINDINGS: Stable cardiomediastinal silhouette. Stable position of right-sided chest tube. No definite pneumothorax is noted currently. Stable bibasilar atelectasis and effusions are noted. Bony thorax is unremarkable. IMPRESSION: Stable position of right-sided chest tube. Stable bibasilar atelectasis and effusions. Electronically Signed   By: Marijo Conception M.D.   On: 08/20/2022 08:11   DG Chest 2 View  Result Date: 08/19/2022 CLINICAL DATA:  Pleural effusion, chest tube EXAM: CHEST - 2 VIEW COMPARISON:  08/18/2022 FINDINGS: Pigtail RIGHT thoracostomy tube again seen. Normal heart size and mediastinal contours. Slight rotation to the RIGHT. Bibasilar effusions and atelectasis. Tiny pneumothorax at RIGHT  apex. No acute infiltrate or significant bony findings. IMPRESSION: BILATERAL pleural effusions and basilar atelectasis. Tiny RIGHT pneumothorax despite thoracostomy tube. Electronically Signed   By: Lavonia Dana M.D.   On: 08/19/2022 14:48   DG CHEST PORT 1 VIEW  Result Date: 08/18/2022 CLINICAL DATA:  Pleural effusion. EXAM: PORTABLE CHEST 1 VIEW COMPARISON:  Chest radiograph 08/17/2022 FINDINGS: Right basilar chest tube is stable in position. Patient is rotated towards the right which limits evaluation of this examination. Patient had a small right apical pneumothorax on the previous examination which is probably unchanged but poorly characterized. Increased densities near the lateral right lung base could be related to overlying shadows but cannot exclude re-accumulation of pleural fluid. Persistent small to moderate sized left pleural effusion. Patchy parenchymal densities in both lungs compatible with atelectasis and difficult to exclude mild edema. Heart size is stable. IMPRESSION: 1. Limited examination due to patient rotation. 2. Right chest tube is stable in position. Patient had a small right apical pneumothorax on the previous examination which is probably unchanged but poorly characterized. 3. Increased densities near the lateral right lung base could be related to overlying shadows but cannot exclude re-accumulation of pleural fluid. 4. Persistent small to moderate sized left pleural effusion. Electronically Signed   By: Markus Daft M.D.   On: 08/18/2022 08:19   DG CHEST PORT 1  VIEW  Result Date: 08/17/2022 CLINICAL DATA:  Follow-up pleural effusions. EXAM: PORTABLE CHEST 1 VIEW COMPARISON:  08/15/2022 FINDINGS: Right pleural pigtail catheter remains in place. Minimal residual pleural fluid is noted, as well as a 5% right apical pneumothorax. Small left pleural effusion and left basilar atelectasis shows no significant change. Cardiomegaly remains stable. IMPRESSION: Minimal residual right  pleural fluid and 5% right apical pneumothorax. Right pleural pigtail catheter remains in place. Stable small left pleural effusion and left basilar atelectasis. Electronically Signed   By: Marlaine Hind M.D.   On: 08/17/2022 14:12   VAS [place] LOWER EXTREMITY VENOUS (DVT)  Result Date: 08/15/2022  Lower Venous DVT Study Patient Name:  DESARAI BARRACK  Date of Exam:   08/14/2022 Medical Rec #: [id number]        Accession #:    [id number] Date of Birth: 10-Oct-1960         Patient Gender: F Patient Age:   83 years Exam Location:  [facility] Procedure:      VAS [place] LOWER EXTREMITY VENOUS (DVT) Referring Phys: Noemi Chapel --------------------------------------------------------------------------------  Indications: Edema.  Risk Factors: None identified. Limitations: Poor ultrasound/tissue interface. Comparison Study: No prior studies. Performing Technologist: Oliver Hum RVT  Examination Guidelines: A complete evaluation includes B-mode imaging, spectral Doppler, color Doppler, and power Doppler as needed of all accessible portions of each vessel. Bilateral testing is considered an integral part of a complete examination. Limited examinations for reoccurring indications may be performed as noted. The reflux portion of the exam is performed with the patient in reverse Trendelenburg.  +---------+---------------+---------+-----------+----------+--------------+ RIGHT    CompressibilityPhasicitySpontaneityPropertiesThrombus Aging +---------+---------------+---------+-----------+----------+--------------+ CFV      Full           Yes      Yes                                 +---------+---------------+---------+-----------+----------+--------------+ SFJ      Full                                                        +---------+---------------+---------+-----------+----------+--------------+ FV Prox  Full                                                         +---------+---------------+---------+-----------+----------+--------------+ FV Mid   Full                                                        +---------+---------------+---------+-----------+----------+--------------+ FV DistalFull                                                        +---------+---------------+---------+-----------+----------+--------------+ PFV      Full                                                        +---------+---------------+---------+-----------+----------+--------------+  POP      Full           Yes      Yes                                 +---------+---------------+---------+-----------+----------+--------------+ PTV      Full                                                        +---------+---------------+---------+-----------+----------+--------------+ PERO     Full                                                        +---------+---------------+---------+-----------+----------+--------------+   +---------+---------------+---------+-----------+----------+--------------+ LEFT     CompressibilityPhasicitySpontaneityPropertiesThrombus Aging +---------+---------------+---------+-----------+----------+--------------+ CFV      Full           Yes      Yes                                 +---------+---------------+---------+-----------+----------+--------------+ SFJ      Full                                                        +---------+---------------+---------+-----------+----------+--------------+ FV Prox  Full                                                        +---------+---------------+---------+-----------+----------+--------------+ FV Mid   Full                                                        +---------+---------------+---------+-----------+----------+--------------+ FV DistalFull                                                         +---------+---------------+---------+-----------+----------+--------------+ PFV      Full                                                        +---------+---------------+---------+-----------+----------+--------------+ POP      Full           Yes      Yes                                 +---------+---------------+---------+-----------+----------+--------------+  PTV      Full                                                        +---------+---------------+---------+-----------+----------+--------------+ PERO     Full                                                        +---------+---------------+---------+-----------+----------+--------------+     Summary: RIGHT: - There is no evidence of deep vein thrombosis in the lower extremity.  - No cystic structure found in the popliteal fossa.  LEFT: - There is no evidence of deep vein thrombosis in the lower extremity.  - No cystic structure found in the popliteal fossa.  *See table(s) above for measurements and observations. Electronically signed by Deitra Mayo MD on 08/15/2022 at 3:02:04 PM.    Final    DG CHEST PORT 1 VIEW  Result Date: 08/15/2022 CLINICAL DATA:  142230 Pleural effusion 142230 EXAM: PORTABLE CHEST 1 VIEW COMPARISON:  August 14, 2022 FINDINGS: The cardiomediastinal silhouette is unchanged in contour.RIGHT-sided chest tube. RIGHT-sided hydropneumothorax with a mildly decreased fluid component in comparison to prior thereby mildly increased conspicuity of the air component. Moderate LEFT pleural effusion. Bibasilar opacities, similar in comparison to prior. Revisualization of a peripheral RIGHT rind of airspace opacities. IMPRESSION: 1. RIGHT-sided hydropneumothorax with mild decrease in the fluid component and increased conspicuity of the air component. There is likely a corresponding corticated RIGHT lung. 2. Moderate LEFT pleural effusion, similar in comparison to prior. Electronically Signed   By: Valentino Saxon M.D.   On: 08/15/2022 09:52   DG CHEST PORT 1 VIEW  Result Date: 08/14/2022 CLINICAL DATA:  Right chest tube, bilateral pleural effusions EXAM: PORTABLE CHEST 1 VIEW COMPARISON:  Previous studies including the examination of 08/13/2022 FINDINGS: Transverse diameter of heart is increased. Right chest tube is noted with its tip in the medial right lower lung field. Moderate bilateral pleural effusions are seen, more so on the left side. There is interval decrease in right pleural effusion. Sharp appearance of right cardiac margin may suggest loculated pneumothorax in the right lower lung field with interval decrease. There is no demonstrable apical pneumothorax. IMPRESSION: Cardiomegaly. Bilateral pleural effusions, more so on the left side. There is interval decrease in right pleural effusion and possible decrease in loculated pneumothorax in right lower lung field. There is no demonstrable apical pneumothorax. Electronically Signed   By: Elmer Picker M.D.   On: 08/14/2022 08:21   DG CHEST PORT 1 VIEW  Result Date: 08/13/2022 CLINICAL DATA:  Pleural effusion EXAM: PORTABLE CHEST 1 VIEW COMPARISON:  CXR 08/12/22 FINDINGS: Right-sided pigtail pleural drainage catheter in place, unchanged in appearance from prior exam. Redemonstrated is a large right-sided hydropneumothorax which overall appears similar to prior exam with unchanged appearance of the air component along the apical and lateral margins of the right lung and the fluid component layering at the right lung base. The appearance of the left lung is also unchanged from prior exam with a persistent small layering pleural effusion. There is no new focal airspace opacity. Cardiac and mediastinal contours are unchanged from prior exam. No new displaced  rib fracture. Visualized upper abdomen is unremarkable. IMPRESSION: 1. No significant change in appearance of the large right-sided hydropneumothorax with a pigtail pleural drainage catheter  in place. 2.  Unchanged layering moderate sized left pleural effusion. Electronically Signed   By: Marin Roberts M.D.   On: 08/13/2022 07:21   DG CHEST PORT 1 VIEW  Result Date: 08/12/2022 CLINICAL DATA:  [id number] with right hydropneumothorax, left pleural effusion. EXAM: PORTABLE CHEST 1 VIEW COMPARISON:  Portable chest yesterday at 7:28 a.m. FINDINGS: 4:29 a.m. Pigtail chest tube with tip in the medial right chest base is similar in positioning. Moderate to large bilateral pleural effusions, on the left layering posteriorly without pneumothorax, on the right again with loculated hydropneumothorax and extrapleural air again noted laterally. The right hydropneumothorax is estimated to occupy much as 50% of the right chest volume with the hydro component predominating. There is overlying consolidation or atelectasis in the lower lung fields. The overall aeration pattern seems unchanged. There is cardiomegaly with mild central vascular prominence. No overt edema is seen. Stable mediastinum. Again noted is a destructive lesion of the lateral right fifth rib likely metastasis. The patient is rotated to the right. IMPRESSION: 1. No significant change in the appearance of the chest since yesterday's study. 2. The right hydropneumothorax is estimated to occupy much as 50% of the right chest volume with the hydro component predominating. 3. There is overlying consolidation or atelectasis in the lower lung fields with moderate-to-large layering left effusion. 4. Cardiomegaly with mild central vascular prominence. Electronically Signed   By: Telford Nab M.D.   On: 08/12/2022 06:03   DG Chest Port 1 View  Result Date: 08/11/2022 CLINICAL DATA:  Pleural effusion, metastatic breast carcinoma EXAM: PORTABLE CHEST 1 VIEW COMPARISON:  Previous studies including the examination of 08/10/2022 FINDINGS: Apparent shift of mediastinum to the right may be due to rotation. Transverse diameter of heart is increased. Moderate to  large bilateral pleural effusions are seen. There is interval decrease in right pleural effusion. Single right chest tube is seen. Central pulmonary vessels are less prominent. Evaluation of right mid and both lower lung fields for infiltrates is limited by the effusions. There is no pneumothorax. IMPRESSION: Moderate to large bilateral pleural effusions. There is interval decrease in right pleural effusion. Electronically Signed   By: Elmer Picker M.D.   On: 08/11/2022 09:29   CT CHEST WO CONTRAST  Result Date: 08/10/2022 CLINICAL DATA:  Metastatic breast cancer.  Empyema. * Tracking Code: BO * EXAM: CT CHEST WITHOUT CONTRAST TECHNIQUE: Multidetector CT imaging of the chest was performed following the standard protocol without IV contrast. RADIATION DOSE REDUCTION: This exam was performed according to the departmental dose-optimization program which includes automated exposure control, adjustment of the mA and/or kV according to patient size and/or use of iterative reconstruction technique. COMPARISON:  06/25/2022 FINDINGS: Cardiovascular: Mild atherosclerotic calcification of the aortic arch. Mediastinum/Nodes: There is hazy diffuse edema/infiltration of the mediastinal and subcutaneous adipose tissues compatible with third spacing of fluid. Lungs/Pleura: Large loculated right hydropneumothorax. Pigtail right pleural drainage catheter in place. IDA. Made only about 25% of the right lung is aerated. There is scattered atelectasis in the right lung. Moderate to large left pleural effusion encompassing about 1/3 of the left hemithoracic volume. No definite mass or nodule in the aerated portion of the left lung. Upper Abdomen: Stable small hypodense hepatic lesions, compatible with cysts. Stable benign cyst in the left kidney upper pole, not requiring further imaging workup. Musculoskeletal: The CT scan  from 06/25/2022 showed enhancing lesions in the right chest wall and axilla. These are substantially  less conspicuous today although likely mainly from lack of IV contrast rather than resolution. A right axillary mass or lymph node measures 1.8 cm in short axis on image 52 series 2, formerly 1.9 cm. Bony destructive findings of the right fifth rib laterally as on image 62 series 5. Sclerotic focus possibly a healing fracture in the right eighth rib laterally. Extensive sclerosis in the sternum favoring metastatic disease over infection. Nearly complete central collapse of the T3 vertebral body with associated sclerosis and minimal posterior bony retropulsion unchanged from prior. IMPRESSION: 1. Large loculated right hydropneumothorax encompassing about 75% of right hemithoracic volume. Pigtail right pleural drainage catheter in place. 2. Moderate to large left pleural effusion encompassing about 1/3 of the left hemithoracic volume. 3. Right axillary mass or lymph node measures 1.8 cm in short axis, formerly 1.9 cm. Other previously seen chest wall soft tissue lesions are less conspicuous on today's exam due to lack of IV contrast, but likely still present. 4. Bony destructive findings of the right fifth rib laterally compatible with metastatic disease. 5. Extensive sclerosis in the sternum favoring metastatic disease over infection. 6. Nearly complete central collapse of the T3 vertebral body with associated sclerosis and minimal posterior bony retropulsion, unchanged from prior. 7. Diffuse hazy edema/infiltration of the mediastinal and subcutaneous adipose tissues compatible with third spacing of fluid. 8. Aortic atherosclerosis. Aortic Atherosclerosis (ICD10-I70.0). Electronically Signed   By: Van Clines M.D.   On: 08/10/2022 10:39   DG Chest Port 1 View  Result Date: 08/10/2022 CLINICAL DATA:  Pleural effusion. Follow-up. Acute respiratory failure. EXAM: PORTABLE CHEST 1 VIEW COMPARISON:  08/08/2022 FINDINGS: There is a right-sided pigtail thoracostomy tube overlying the medial right lower lung.  Right-sided, locule hydropneumothorax is identified. No significant change in volume compared with the previous exam. Moderate left pleural effusion appears increased in volume. IMPRESSION: 1. No significant change in volume of right-sided hydropneumothorax. 2. Increase in volume of moderate left pleural effusion. 3. Stable position of right thoracostomy tube. Electronically Signed   By: Kerby Moors M.D.   On: 08/10/2022 09:07   DG Chest Port 1 View  Result Date: 08/09/2022 CLINICAL DATA:  Acute respiratory failure, right pleural effusion EXAM: PORTABLE CHEST 1 VIEW COMPARISON:  08/08/2022 FINDINGS: Interval placement of right basilar chest tube. Decreasing right pleural effusion. Moderate right effusion remains. No pneumothorax. Extensive airspace disease throughout the right lung. Left perihilar and lower lobe airspace disease with layering left effusion. Cardiomegaly. No acute bony abnormality. IMPRESSION: Interval placement of right chest tube with decreasing right pleural effusion and slight improved aeration within the right lung. Continued diffuse airspace disease on the right and in the left lower lung. Bilateral effusions. No pneumothorax. Electronically Signed   By: Rolm Baptise M.D.   On: 08/09/2022 08:12   CT PERC PLEURAL DRAIN W/INDWELL CATH W/IMG GUIDE  Result Date: 08/08/2022 INDICATION: 61 year old woman with right empyema presents to IR for chest tube placement EXAM: CT-guided right chest tube placement TECHNIQUE: Multidetector CT imaging of the chest was performed following the standard protocol without IV contrast. RADIATION DOSE REDUCTION: This exam was performed according to the departmental dose-optimization program which includes automated exposure control, adjustment of the mA and/or kV according to patient size and/or use of iterative reconstruction technique. MEDICATIONS: The patient is currently admitted to the hospital and receiving intravenous antibiotics. The antibiotics  were administered within an appropriate time frame prior to  the initiation of the procedure. ANESTHESIA/SEDATION: Moderate (conscious) sedation was employed during this procedure. A total of Versed 2 mg and Fentanyl 100 mcg was administered intravenously by the radiology nurse. Total intra-service moderate Sedation Time: 14 minutes. The patient's level of consciousness and vital signs were monitored continuously by radiology nursing throughout the procedure under my direct supervision. COMPLICATIONS: None immediate. PROCEDURE: Informed written consent was obtained from the patient after a thorough discussion of the procedural risks, benefits and alternatives. All questions were addressed. Maximal Sterile Barrier Technique was utilized including caps, mask, sterile gowns, sterile gloves, sterile drape, hand hygiene and skin antiseptic. A timeout was performed prior to the initiation of the procedure. Patient positioned left lateral decubitus on the procedure table. The right lateral chest wall skin prepped and draped in usual fashion. Following local lidocaine administration, right pleural space was accessed with 19 gauge Yueh needle utilizing CT guidance. Yueh catheter removed over 0.035 inch Amplatz guidewire, serial dilation performed, and 14 [place] multipurpose pigtail drain inserted. CT confirmed appropriate positioning of the drain within the right pleural space. The drain was connected to Pleur-Evac and secured to skin with suture. IMPRESSION: CT-guided right chest tube (14 [place]) as above. Electronically Signed   By: Miachel Roux M.D.   On: 08/08/2022 17:02   DG Chest Port 1 View  Result Date: 08/08/2022 CLINICAL DATA:  Follow-up pleural effusion. EXAM: PORTABLE CHEST 1 VIEW COMPARISON:  08/07/2022 FINDINGS: Exam detail is diminished due to patient positioning with significant rightward rotational artifact. The cardiomediastinal contours appear unchanged. There is a large right pleural effusion which is  unchanged in volume compared with the previous exam. There is significantly diminished aeration to the right lung with only a small amount of aerated lung in the right upper lobe. Small left pleural effusion is unchanged. IMPRESSION: 1. No change in large right pleural effusion with significantly diminished aeration to the right lung. Electronically Signed   By: Kerby Moors M.D.   On: 08/08/2022 07:05   DG Chest Port 1 View  Result Date: 08/07/2022 CLINICAL DATA:  Pleural effusion EXAM: PORTABLE CHEST 1 VIEW COMPARISON:  08/06/2022 FINDINGS: No significant change in rotated AP portable examination. Large right pleural effusion with near total atelectasis or consolidation of the right lung, as well as a small, layering left pleural effusion. Cardiomegaly, the cardiac borders largely obscured. IMPRESSION: No significant change in rotated AP portable examination. Large right pleural effusion with near total atelectasis or consolidation of the right lung, as well as a small, layering left pleural effusion. Electronically Signed   By: Delanna Ahmadi M.D.   On: 08/07/2022 09:17   DG Chest Port 1 View  Result Date: 08/06/2022 CLINICAL DATA:  S/P thoracentesis EXAM: PORTABLE CHEST 1 VIEW COMPARISON:  Same day chest x-ray. FINDINGS: Decreased but still large right pleural effusion. Small left pleural effusion. Bilateral overlying opacities. No visible pneumothorax on this semi erect radiograph. Cardiomediastinal silhouette is similar. Polyarticular degenerative change. IMPRESSION: 1. Decreased but still large right pleural effusion. No visible pneumothorax on this semi erect radiograph. 2. Small left pleural effusion 3. Bilateral overlying opacities. Electronically Signed   By: Margaretha Sheffield M.D.   On: 08/06/2022 15:50   DG Chest 1 View  Result Date: 08/06/2022 CLINICAL DATA:  Respiratory failure. EXAM: CHEST  1 VIEW COMPARISON:  Multiple recent chest x-rays. FINDINGS: Persistent complete opacification of  the right hemithorax due to a large pleural effusion. Slight shift of the heart and mediastinum to the left exaggerated by the  rotation of the patient and thoracic scoliosis. Persistent left lower lobe atelectasis but no left pleural effusion. IMPRESSION: Persistent complete opacification of the right hemithorax. Electronically Signed   By: Marijo Sanes M.D.   On: 08/06/2022 11:48   DG Chest Port 1 View  Result Date: 08/05/2022 CLINICAL DATA:  Shortness of breath EXAM: PORTABLE CHEST 1 VIEW COMPARISON:  08/05/2022, 06/25/2022, 08/03/2022 FINDINGS: Complete opacification of right thorax without change. Subsegmental atelectasis left base. Partially obscured cardiomediastinal silhouette. IMPRESSION: Complete opacification of the right thorax without change, probably due to sizable pleural effusion. Subsegmental atelectasis left base Electronically Signed   By: Donavan Foil M.D.   On: 08/05/2022 23:33   ECHOCARDIOGRAM COMPLETE  Result Date: 08/05/2022    ECHOCARDIOGRAM REPORT   Patient Name:   KHOLE ARTERBURN Date of Exam: 08/05/2022 Medical Rec #:  [id number]       Height:       66.0 in Accession #:    [id number]      Weight:       118.6 lb Date of Birth:  1961/07/06        BSA:          1.602 m Patient Age:    41 years        BP:           97/44 mmHg Patient Gender: F               HR:           78 bpm. Exam Location:  Inpatient Procedure: 2D Echo, Cardiac Doppler and Color Doppler Indications:    R94.31 Abnormal EKG  History:        Patient has no prior history of Echocardiogram examinations.                 Risk Factors:Hypertension. Metastatic breast cancer.  Sonographer:    [place] Referring Phys: [id number] AMRIT ADHIKARI  Sonographer Comments: Technically difficult study due to poor echo windows, suboptimal apical window and suboptimal subcostal window. Apical images off axis due to the hardened texture of the left breast. Could not turn patient. Wound dressings in subcostal region. IMPRESSIONS  1.  Left ventricular ejection fraction, by estimation, is 55 to 60%. The left ventricle has normal function. The left ventricle has no regional wall motion abnormalities. Left ventricular diastolic parameters were normal.  2. Right ventricular systolic function is normal. The right ventricular size is normal. Mildly increased right ventricular wall thickness. There is mildly elevated pulmonary artery systolic pressure. The estimated right ventricular systolic pressure is 32.9 mmHg.  3. Left atrial size was mildly dilated.  4. A small pericardial effusion is present. The pericardial effusion is circumferential. There is no evidence of cardiac tamponade. Large pleural effusion in the right lateral region.  5. 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 Tricuspid valve regurgitation is mild to moderate.  7. The aortic valve is tricuspid. Aortic valve regurgitation is not visualized. No aortic stenosis is present.  8. The inferior vena cava is normal in size with greater than 50% respiratory variability, suggesting right atrial pressure of 3 mmHg. FINDINGS  Left Ventricle: Left ventricular ejection fraction, by estimation, is 55 to 60%. The left ventricle has normal function. The left ventricle has no regional wall motion abnormalities. The left ventricular internal cavity size was normal in size. There is  no left ventricular hypertrophy. Left ventricular diastolic parameters were normal. Right Ventricle: The right ventricular size is normal. Mildly increased right ventricular wall thickness. Right ventricular systolic function is normal. There is mildly elevated pulmonary artery systolic pressure. The tricuspid regurgitant velocity is 3.19 m/s, and with an assumed right atrial pressure of 3 mmHg, the estimated right ventricular systolic pressure is 97.4 mmHg. Left Atrium: Left atrial size  was mildly dilated. Right Atrium: Right atrial size was normal in size. Pericardium: Complex septated fluid collection in the right pleural cavity, consider empyema or malignant effusion. A small pericardial effusion is present. The pericardial effusion is circumferential. There is no evidence of cardiac tamponade. 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 There is mild thickening of the mitral valve leaflet(s). Mild mitral valve regurgitation, with centrally-directed jet. Tricuspid Valve: The tricuspid valve is myxomatous. Tricuspid valve regurgitation is mild to moderate. Aortic Valve: The aortic valve is tricuspid. Aortic valve regurgitation is not visualized. No aortic stenosis is present. Pulmonic Valve: The pulmonic valve was normal in structure. Pulmonic valve regurgitation is mild. Aorta: The aortic root and ascending aorta are structurally normal, with no evidence of dilitation. Venous: IVC assessment for right atrial pressure unable to be performed due to mechanical ventilation. The inferior vena cava is normal in size with greater than 50% respiratory variability, suggesting right atrial pressure of 3 mmHg. IAS/Shunts: No atrial level shunt detected by color flow Doppler. Additional Comments: There is a large pleural effusion in the right lateral region.  LEFT VENTRICLE PLAX 2D LVIDd:         4.43 cm     Diastology LVIDs:         2.80 cm     LV e' medial:    5.08 cm/s LV PW:         1.03 cm     LV E/e' medial:  10.3 LV IVS:        0.87 cm     LV e' lateral:   6.46 cm/s LVOT diam:     2.10 cm     LV E/e' lateral: 8.1 LV SV:         44 LV SV Index:   28 LVOT Area:     3.46 cm  LV Volumes (MOD) LV vol d, MOD A4C: 57.5 ml LV vol s, MOD A4C: 18.7 ml LV SV MOD A4C:     57.5 ml RIGHT VENTRICLE             IVC RV S prime:     13.10 cm/s  IVC diam: 1.10 cm TAPSE (M-mode): 1.7 cm LEFT ATRIUM           Index        RIGHT ATRIUM            Index LA diam:      3.20 cm 2.00 cm/m   RA Area:     14.20 cm LA Vol (A4C): 57.5 ml 35.90 ml/m  RA Volume:   35.90 ml  22.42 ml/m  AORTIC VALVE             PULMONIC VALVE LVOT Vmax:   75.15 cm/s  PR End Diast Vel: 2.14 msec LVOT Vmean:  45.850 cm/s LVOT VTI:    0.127 m  AORTA Ao Root diam: 2.60 cm Ao Asc diam:  2.70 cm MITRAL VALVE  TRICUSPID VALVE MV Area (PHT): 4.89 cm    TR Peak grad:   40.7 mmHg MV Decel Time: 155 msec    TR Vmax:        319.00 cm/s MV E velocity: 52.40 cm/s MV A velocity: 50.00 cm/s  SHUNTS MV E/A ratio:  1.05        Systemic VTI:  0.13 m                            Systemic Diam: 2.10 cm Dani Gobble Croitoru MD Electronically signed by Sanda Klein MD Signature Date/Time: 08/05/2022/12:38:14 PM    Final    DG CHEST PORT 1 VIEW  Result Date: 08/05/2022 CLINICAL DATA:  Right pleural effusion. EXAM: PORTABLE CHEST 1 VIEW COMPARISON:  08/05/2022 FINDINGS: Stable cardiomediastinal contours. Left lung is clear. Complete opacification of the right hemithorax is again noted and appears unchanged. Findings are favored to represent known large right pleural effusion. IMPRESSION: 1. No change in complete opacification of the right hemithorax which is presumed to represent large right pleural effusion. Electronically Signed   By: Kerby Moors M.D.   On: 08/05/2022 10:06   DG CHEST PORT 1 VIEW  Result Date: 08/05/2022 CLINICAL DATA:  Respiratory compromise EXAM: PORTABLE CHEST 1 VIEW COMPARISON:  08/03/2022 FINDINGS: Complete opacification of the right hemithorax, presumably reflecting a large pleural effusion. Left lung is clear. No pneumothorax. The heart is normal in size. IMPRESSION: Complete opacification of the right hemithorax, presumably reflecting a large pleural effusion. Electronically Signed   By: Julian Hy M.D.   On: 08/05/2022 00:41   US THORACENTESIS ASP PLEURAL SPACE W/IMG GUIDE  Result Date: 08/03/2022 INDICATION: Patient with history of right-sided breast  cancer, right pleural effusion. Request is made for diagnostic and therapeutic thoracentesis. EXAM: ULTRASOUND GUIDED RIGHT THORACENTESIS MEDICATIONS: 10 mL 1% lidocaine COMPLICATIONS: None immediate. PROCEDURE: An ultrasound guided thoracentesis was thoroughly discussed with the patient and questions answered. The benefits, risks, alternatives and complications were also discussed. The patient understands and wishes to proceed with the procedure. Written consent was obtained. Ultrasound was performed to localize and mark an adequate pocket of fluid in the right chest. The area was then prepped and draped in the normal sterile fashion. 1% Lidocaine was used for local anesthesia. Under ultrasound guidance a 6 Fr Safe-T-Centesis catheter was introduced. Thoracentesis was performed. The catheter was removed and a dressing applied. FINDINGS: A total of approximately 800 mL of yellow fluid was removed. Samples were sent to the laboratory as requested by the clinical team. IMPRESSION: Successful ultrasound guided right thoracentesis yielding 800 mL of pleural fluid. Read by: Brynda Greathouse PA-C Electronically Signed   By: Sandi Mariscal M.D.   On: 08/03/2022 13:06   DG Chest Port 1 View  Result Date: 08/03/2022 CLINICAL DATA:  Post right thoracentesis EXAM: PORTABLE CHEST 1 VIEW COMPARISON:  08/02/2022 FINDINGS: Continued complete opacification of the right hemithorax. No visible pneumothorax. No confluent opacity or effusion on the left. Heart is normal size. IMPRESSION: Continued complete opacification of the right hemithorax. No visible pneumothorax. Electronically Signed   By: Rolm Baptise M.D.   On: 08/03/2022 11:52   DG Chest Portable 1 View  Result Date: 08/02/2022 CLINICAL DATA:  Weakness and breast cancer.  Dehydration.  Fatigue. EXAM: PORTABLE CHEST 1 VIEW COMPARISON:  Chest CT 06/25/2022 FINDINGS: Increased right pleural effusion with complete opacification of the right hemithorax. There is leftward shift  of the trachea and mediastinal  structures. No aerated right lung is seen. No focal airspace disease in the left lung. Left lung nodules on prior CT are not seen by radiograph. No visible pneumothorax. Osseous metastatic disease on CT not well demonstrated. IMPRESSION: Increased right pleural effusion with complete opacification of the right hemithorax. No aerated right lung is seen. Leftward shift of the trachea and mediastinal structures. Electronically Signed   By: Keith Rake M.D.   On: 08/02/2022 16:53    Microbiology: Results for orders placed or performed during the hospital encounter of 08/02/22  SARS Coronavirus 2 by RT PCR (hospital order, performed in [facility] hospital lab) *cepheid single result test* Anterior Nasal Swab     Status: None   Collection Time: 08/02/22  5:51 PM   Specimen: Anterior Nasal Swab  Result Value Ref Range Status   SARS Coronavirus 2 by RT PCR NEGATIVE NEGATIVE Final    Comment: (NOTE) SARS-CoV-2 target nucleic acids are NOT DETECTED.  The SARS-CoV-2 RNA is generally detectable in upper and lower respiratory specimens during the acute phase of infection. The lowest concentration of SARS-CoV-2 viral copies this assay can detect is 250 copies / mL. A negative result does not preclude SARS-CoV-2 infection and should not be used as the sole basis for treatment or other patient management decisions.  A negative result may occur with improper specimen collection / handling, submission of specimen other than nasopharyngeal swab, presence of viral mutation(s) within the areas targeted by this assay, and inadequate number of viral copies (<250 copies / mL). A negative result must be combined with clinical observations, patient history, and epidemiological information.  Fact Sheet for Patients:   https://www.patel.info/  Fact Sheet for Healthcare Providers: https://hall.com/  This test is not yet approved or   cleared by the [place] FDA and has been authorized for detection and/or diagnosis of SARS-CoV-2 by FDA under an Emergency Use Authorization (EUA).  This EUA will remain in effect (meaning this test can be used) for the duration of the COVID-19 declaration under Section 564(b)(1) of the Act, 21 U.S.C. section 360bbb-3(b)(1), unless the authorization is terminated or revoked sooner.  Performed at [facility], [place] [address], [place], [place] [address]   Culture, body fluid w Gram Stain-bottle     Status: Abnormal   Collection Time: 08/03/22  6:05 PM   Specimen: Pleura  Result Value Ref Range Status   Specimen Description PLEURAL FLUID  Final   Special Requests   Final    BOTTLES DRAWN AEROBIC AND ANAEROBIC Blood Culture results may not be optimal due to an excessive volume of blood received in culture bottles   Gram Stain   Final    ABUNDANT GRAM POSITIVE COCCI IN CLUSTERS RARE WBC PRESENT, PREDOMINANTLY PMN IN BOTH AEROBIC AND ANAEROBIC BOTTLES Performed at [facility], [place] [address] [place], [place] [address]    Culture STAPHYLOCOCCUS AUREUS (A)  Final   Report Status 08/06/2022 FINAL  Final   Organism ID, Bacteria STAPHYLOCOCCUS AUREUS  Final      Susceptibility   Staphylococcus aureus - MIC*    CIPROFLOXACIN 1 SENSITIVE Sensitive     ERYTHROMYCIN >=8 RESISTANT Resistant     GENTAMICIN <=0.5 SENSITIVE Sensitive     OXACILLIN 0.5 SENSITIVE Sensitive     TETRACYCLINE <=1 SENSITIVE Sensitive     VANCOMYCIN 1 SENSITIVE Sensitive     TRIMETH/SULFA <=10 SENSITIVE Sensitive     CLINDAMYCIN <=0.25 SENSITIVE Sensitive     RIFAMPIN <=0.5 SENSITIVE Sensitive  Inducible Clindamycin NEGATIVE Sensitive     * STAPHYLOCOCCUS AUREUS  MRSA Next Gen by PCR, Nasal     Status: None   Collection Time: 08/04/22 10:38 PM   Specimen: Nasal Mucosa; Nasal Swab  Result Value Ref Range Status   MRSA by PCR Next Gen NOT DETECTED NOT DETECTED Final    Comment:  (NOTE) The GeneXpert MRSA Assay (FDA approved for NASAL specimens only), is one component of a comprehensive MRSA colonization surveillance program. It is not intended to diagnose MRSA infection nor to guide or monitor treatment for MRSA infections. Test performance is not FDA approved in patients less than 43 years old. Performed at [facility], [place] [address] [place], [place] [address]   Body fluid culture w Gram Stain     Status: None   Collection Time: 08/06/22 12:31 PM   Specimen: Pleural Fluid  Result Value Ref Range Status   Specimen Description   Final    PLEURAL Performed at [place] [address], [place], [place] [address]    Special Requests   Final    NONE Performed at [facility], [place] [address] [place], [place] [address]    Gram Stain   Final    FEW WBC PRESENT, PREDOMINANTLY PMN NO ORGANISMS SEEN Performed at [facility], [place] [address] [place], [place] [address]    Culture RARE STAPHYLOCOCCUS AUREUS  Final   Report Status 08/09/2022 FINAL  Final   Organism ID, Bacteria STAPHYLOCOCCUS AUREUS  Final      Susceptibility   Staphylococcus aureus - MIC*    CIPROFLOXACIN 1 SENSITIVE Sensitive     ERYTHROMYCIN >=8 RESISTANT Resistant     GENTAMICIN <=0.5 SENSITIVE Sensitive     OXACILLIN 0.5 SENSITIVE Sensitive     TETRACYCLINE <=1 SENSITIVE Sensitive     VANCOMYCIN <=0.5 SENSITIVE Sensitive     TRIMETH/SULFA <=10 SENSITIVE Sensitive     CLINDAMYCIN <=0.25 SENSITIVE Sensitive     RIFAMPIN <=0.5 SENSITIVE Sensitive     Inducible Clindamycin NEGATIVE Sensitive     * RARE STAPHYLOCOCCUS AUREUS  Body fluid culture w Gram Stain     Status: None   Collection Time: 08/12/22  2:44 PM   Specimen: Pleura; Body Fluid  Result Value Ref Range Status   Specimen Description   Final    PLEURAL RIGHT Performed at [facility], [address] [place], [place] [address]    Special Requests    Final    Immunocompromised Performed at [facility], [place] [address] [place], [place] [address]    Gram Stain   Final    FEW WBC PRESENT, PREDOMINANTLY MONONUCLEAR NO ORGANISMS SEEN    Culture   Final    NO GROWTH 3 DAYS Performed at [place] [address], [place],  [address]    Report Status 08/15/2022 FINAL  Final    Labs: CBC: Recent Labs  Lab 08/28/22 0454  WBC 4.9  HGB 8.2*  HCT 27.8*  MCV 90.0  PLT 616   Basic Metabolic Panel: Recent Labs  Lab 08/28/22 0454  NA 141  K 3.7  CL 106  CO2 27  GLUCOSE 160*  BUN 10  CREATININE 0.44  CALCIUM 7.5*   Liver Function Tests: Recent Labs  Lab 08/28/22 0454  AST 23  ALT 8  ALKPHOS 45  BILITOT 0.3  PROT 5.0*  ALBUMIN 2.0*   CBG: No results for input(s): "GLUCAP"  in the last 168 hours.  Discharge time spent: less than 30 minutes.  Signed: Marylu Lund, MD [facility] 08/30/2022

## 2022-09-09 ENCOUNTER — Telehealth: Payer: Self-pay

## 2022-09-09 NOTE — Telephone Encounter (Signed)
Called Pt to discuss stay in rehab facility. Informed Pt that Dr. Alvy Bimler is unable to prolong her stay in the skilled facility as requested. Encouraged Pt to discuss rehab concerns with family members and facility management. Pt verbalized understanding and appreciated Dr. Calton Dach time.

## 2022-09-10 ENCOUNTER — Other Ambulatory Visit (HOSPITAL_COMMUNITY): Payer: Self-pay

## 2022-09-15 ENCOUNTER — Telehealth: Payer: Self-pay

## 2022-09-15 NOTE — Telephone Encounter (Signed)
Returned her call. She is still at Rehab facility. She was diagnosed with covid today and the Rehab facility will order the antiviral medication. Her biggest complaint is a headache. Canceled appts at [facility] for 12/22. She is wishing the office and Dr. Unknown Foley Christmas.  When would you like to reschedule appts?

## 2022-09-15 NOTE — Telephone Encounter (Signed)
I sent LOS to reschedule to 1/9

## 2022-09-16 ENCOUNTER — Other Ambulatory Visit: Payer: Self-pay

## 2022-09-16 ENCOUNTER — Telehealth: Payer: Self-pay

## 2022-09-16 NOTE — Telephone Encounter (Signed)
Returned her call. She is asking when she will see Dr. Alvy Bimler again. Told her Dr. Alvy Bimler sent a scheduling message to see her on 1/9. The scheduler will call her to schedule. She is asking if she should resume Verzenio on 12/23. Told her per Dr. Alvy Bimler that she can resume once she is d/ced home from the rehab facility. She verbalized understanding and thinks that she will be d/ced home next week.

## 2022-09-17 ENCOUNTER — Telehealth: Payer: Self-pay | Admitting: Hematology and Oncology

## 2022-09-17 ENCOUNTER — Telehealth: Payer: Self-pay

## 2022-09-17 NOTE — Telephone Encounter (Signed)
Spoke with patient in regards to mother, Marissa Nestle, recent telephone message regarding patient's Verzenio. Confirmed with patient that she will resume medication upon discharge from rehab facility, which is expected for Tuesday, 12/26. Patient verbalized an understanding of the information. Patient gave verbal permission to share information with mother. Patient's mother called back and voicemail left with the following information.

## 2022-09-17 NOTE — Telephone Encounter (Signed)
Per 12/18 IB, pt has been called and confirmed

## 2022-09-19 ENCOUNTER — Ambulatory Visit: Payer: BC Managed Care – PPO | Admitting: Hematology and Oncology

## 2022-09-19 ENCOUNTER — Other Ambulatory Visit: Payer: BC Managed Care – PPO

## 2022-09-24 ENCOUNTER — Other Ambulatory Visit (HOSPITAL_COMMUNITY): Payer: Self-pay

## 2022-10-06 ENCOUNTER — Telehealth: Payer: Self-pay

## 2022-10-06 NOTE — Telephone Encounter (Signed)
Called and left a message asking [name] to call the office back. Left detailed a message with appt details tomorrow at 2 pm.

## 2022-10-06 NOTE — Telephone Encounter (Signed)
Called back and spoke with [name]. They are aware of appt tomorrow and will be at the appt at 2 pm. They want to keep appts. [name] has moved to different nursing home about a week ago and is supposed to stay 1 month.

## 2022-10-07 ENCOUNTER — Other Ambulatory Visit: Payer: Self-pay | Admitting: Hematology and Oncology

## 2022-10-07 ENCOUNTER — Telehealth: Payer: Self-pay | Admitting: Hematology and Oncology

## 2022-10-07 ENCOUNTER — Ambulatory Visit: Payer: BC Managed Care – PPO | Admitting: Hematology and Oncology

## 2022-10-07 ENCOUNTER — Telehealth: Payer: Self-pay

## 2022-10-07 ENCOUNTER — Other Ambulatory Visit: Payer: Self-pay

## 2022-10-07 MED ORDER — HYDROMORPHONE HCL 4 MG PO TABS: 4.0000 mg | ORAL_TABLET | Freq: Four times a day (QID) | ORAL | 0 refills | Status: DC | PRN

## 2022-10-07 NOTE — Telephone Encounter (Signed)
Nurse at [facility] senior living called and requested Dilaudid Rx to [facility] in [place]. This is the pharmacy that [place] uses.

## 2022-10-07 NOTE — Telephone Encounter (Signed)
Patient called to r/s 1/9 appointment due to weather. Spoke with MD and r/s patient. Patient notified.

## 2022-10-07 NOTE — Telephone Encounter (Signed)
I sent it No guarantee they will have it

## 2022-10-10 ENCOUNTER — Telehealth: Payer: Self-pay

## 2022-10-10 ENCOUNTER — Other Ambulatory Visit (HOSPITAL_COMMUNITY): Payer: Self-pay

## 2022-10-10 NOTE — Telephone Encounter (Signed)
Attempted to contact patient and patient's emergency contacts on behalf of Drema Halon, [facility] to determine whether patient had resumed her Verzenio. Patient's sister returned call to inform RN that the patient had resumed the medication sometime last week. The patient is requesting that the medication continue to be sent to her home address where her mother and sister can pick it up and deliver it to the patient at [facility]. Patient is only expected to be there for one month before returning home.  Sister requested to advise patient to return call to pharmacy staff to help determine medication schedule. Sister verbalized an understanding. Have not received call back from patient at this time.  Pharmacy staff and Dr. Alvy Bimler aware of the situation.

## 2022-10-10 NOTE — Telephone Encounter (Signed)
Patient's mother called to express concern that patient has been off Verzenio for too long. Dr. Alvy Bimler made aware of the situation. RN spent time explaining the importance of patient being evaluated in the clinic and having updated lab work prior to resuming Verzenio, especially after patient had complicated medical history with recent hospitalization, rehab, and Covid infection. Mother continued to express concern and confusion as to why patient could not restart medication. Reiterated that Dr. Alvy Bimler does not feel that it is safe for the patient to continue the medication without evaluation at this time. Confirmed that patient will be evaluated again on 1/23. Dr. Alvy Bimler will not be able to see the patient any sooner. Mother expressed disappointment and requests that patient be seen sooner, if there is a cancellation.  Dr. Alvy Bimler well aware of the situation.

## 2022-10-10 NOTE — Telephone Encounter (Signed)
Patient called and informed that she is not to start back on Verzenio until she has been evaluated by Dr. Alvy Bimler. Patient had report restarting the medication, but will stop. Patient verbalized an understanding that she is to hold the medication until Dr. Alvy Bimler sees her again in the clinic on 1/23 at 2:00 PM.  All questions answered during call.

## 2022-10-14 ENCOUNTER — Inpatient Hospital Stay: Payer: BC Managed Care – PPO | Attending: Hematology and Oncology | Admitting: Hematology and Oncology

## 2022-10-14 ENCOUNTER — Inpatient Hospital Stay: Payer: BC Managed Care – PPO

## 2022-10-14 ENCOUNTER — Encounter: Payer: Self-pay | Admitting: Hematology and Oncology

## 2022-10-14 ENCOUNTER — Telehealth: Payer: Self-pay

## 2022-10-14 ENCOUNTER — Other Ambulatory Visit (HOSPITAL_COMMUNITY): Payer: Self-pay

## 2022-10-14 ENCOUNTER — Other Ambulatory Visit: Payer: Self-pay

## 2022-10-14 VITALS — BP 159/83 | HR 126 | Temp 97.7°F | Resp 18 | Ht 66.0 in | Wt 134.0 lb

## 2022-10-14 DIAGNOSIS — Z170 Estrogen receptor positive status [ER+]: Secondary | ICD-10-CM | POA: Diagnosis not present

## 2022-10-14 DIAGNOSIS — C7951 Secondary malignant neoplasm of bone: Secondary | ICD-10-CM | POA: Insufficient documentation

## 2022-10-14 DIAGNOSIS — C50811 Malignant neoplasm of overlapping sites of right female breast: Secondary | ICD-10-CM | POA: Diagnosis present

## 2022-10-14 DIAGNOSIS — J90 Pleural effusion, not elsewhere classified: Secondary | ICD-10-CM | POA: Diagnosis not present

## 2022-10-14 DIAGNOSIS — E440 Moderate protein-calorie malnutrition: Secondary | ICD-10-CM | POA: Diagnosis not present

## 2022-10-14 DIAGNOSIS — C50911 Malignant neoplasm of unspecified site of right female breast: Secondary | ICD-10-CM

## 2022-10-14 DIAGNOSIS — Z79811 Long term (current) use of aromatase inhibitors: Secondary | ICD-10-CM | POA: Diagnosis not present

## 2022-10-14 DIAGNOSIS — Z79899 Other long term (current) drug therapy: Secondary | ICD-10-CM | POA: Diagnosis not present

## 2022-10-14 DIAGNOSIS — G893 Neoplasm related pain (acute) (chronic): Secondary | ICD-10-CM | POA: Diagnosis not present

## 2022-10-14 DIAGNOSIS — T148XXD Other injury of unspecified body region, subsequent encounter: Secondary | ICD-10-CM

## 2022-10-14 DIAGNOSIS — D509 Iron deficiency anemia, unspecified: Secondary | ICD-10-CM

## 2022-10-14 LAB — CBC WITH DIFFERENTIAL/PLATELET
Abs Immature Granulocytes: 0.01 10*3/uL (ref 0.00–0.07)
Basophils Absolute: 0.1 10*3/uL (ref 0.0–0.1)
Basophils Relative: 1 %
Eosinophils Absolute: 0.1 10*3/uL (ref 0.0–0.5)
Eosinophils Relative: 2 %
HCT: 31.8 % — ABNORMAL LOW (ref 36.0–46.0)
Hemoglobin: 10.4 g/dL — ABNORMAL LOW (ref 12.0–15.0)
Immature Granulocytes: 0 %
Lymphocytes Relative: 49 %
Lymphs Abs: 2.7 10*3/uL (ref 0.7–4.0)
MCH: 25.4 pg — ABNORMAL LOW (ref 26.0–34.0)
MCHC: 32.7 g/dL (ref 30.0–36.0)
MCV: 77.6 fL — ABNORMAL LOW (ref 80.0–100.0)
Monocytes Absolute: 0.3 10*3/uL (ref 0.1–1.0)
Monocytes Relative: 6 %
Neutro Abs: 2.3 10*3/uL (ref 1.7–7.7)
Neutrophils Relative %: 42 %
Platelets: 231 10*3/uL (ref 150–400)
RBC: 4.1 MIL/uL (ref 3.87–5.11)
RDW: 18.5 % — ABNORMAL HIGH (ref 11.5–15.5)
WBC: 5.5 10*3/uL (ref 4.0–10.5)
nRBC: 0 % (ref 0.0–0.2)

## 2022-10-14 LAB — COMPREHENSIVE METABOLIC PANEL
ALT: 8 U/L (ref 0–44)
AST: 14 U/L — ABNORMAL LOW (ref 15–41)
Albumin: 3.5 g/dL (ref 3.5–5.0)
Alkaline Phosphatase: 50 U/L (ref 38–126)
Anion gap: 7 (ref 5–15)
BUN: 10 mg/dL (ref 8–23)
CO2: 24 mmol/L (ref 22–32)
Calcium: 9.2 mg/dL (ref 8.9–10.3)
Chloride: 107 mmol/L (ref 98–111)
Creatinine, Ser: 0.98 mg/dL (ref 0.44–1.00)
GFR, Estimated: >60 mL/min (ref >60)
Glucose, Bld: 69 mg/dL — ABNORMAL LOW (ref 70–99)
Potassium: 4.2 mmol/L (ref 3.5–5.1)
Sodium: 138 mmol/L (ref 135–145)
Total Bilirubin: 0.6 mg/dL (ref 0.3–1.2)
Total Protein: 7 g/dL (ref 6.5–8.1)

## 2022-10-14 LAB — SAMPLE TO BLOOD BANK

## 2022-10-14 MED ORDER — ABEMACICLIB 100 MG PO TABS
100.0000 mg | ORAL_TABLET | Freq: Two times a day (BID) | ORAL | 1 refills | Status: DC
  Filled 2022-10-14: qty 56, 28d supply, fill #0
  Filled 2022-10-14: qty 70, 35d supply, fill #0
  Filled 2022-11-21: qty 56, 28d supply, fill #1

## 2022-10-14 MED ORDER — HYDROMORPHONE HCL 4 MG PO TABS
4.0000 mg | ORAL_TABLET | Freq: Four times a day (QID) | ORAL | 0 refills | Status: DC | PRN
  Filled 2022-10-14 (×2): qty 90, 23d supply, fill #0

## 2022-10-14 MED ORDER — DENOSUMAB 120 MG/1.7ML ~~LOC~~ SOLN
120.0000 mg | Freq: Once | SUBCUTANEOUS | Status: AC
  Administered 2022-10-14: 120 mg via SUBCUTANEOUS
  Filled 2022-10-14: qty 1.7

## 2022-10-14 NOTE — Assessment & Plan Note (Signed)
She is able to breathe better She has significant signs of rhonchi in the right lung which I suspect due to adhesions/scar tissue but overall reasonable breath sounds I will hold off repeat imaging study until her next visit She does not need thoracentesis now

## 2022-10-14 NOTE — Assessment & Plan Note (Signed)
Her malnutrition has improved but due to ongoing open wound, I suspect she could be at risk of recurrent malnutrition We discussed importance of frequent small meals and high-protein diet

## 2022-10-14 NOTE — Assessment & Plan Note (Signed)
Her chest wall has healed nicely while she was hospitalized and on chronic antibiotics but now she has recurrent open wound again I recommend topical metronidazole gel and daily dressing changes I plan to reassess her wound again next visit

## 2022-10-14 NOTE — Progress Notes (Signed)
[place] OFFICE PROGRESS NOTE  Patient Care Team: Heath Lark, MD as PCP - General (Hematology and Oncology)  ASSESSMENT & PLAN:  Breast cancer, stage 4, right ([place]) Chemotherapy was placed on hold due to recent hospitalization Her infection has resolved I am concerned about progression of disease on her chest wall area  I recommend resumption of abemaciclib in addition to her current antiestrogen therapy She is also overdue for denosumab I plan to see her again next month for further follow-up  Cancer associated pain When she was hospitalized, the patient had signs and symptoms of narcotic overdose She will continue on reduced dose Dilaudid for now  Malnutrition of moderate degree Her malnutrition has improved but due to ongoing open wound, I suspect she could be at risk of recurrent malnutrition We discussed importance of frequent small meals and high-protein diet  Pleural effusion She is able to breathe better She has significant signs of rhonchi in the right lung which I suspect due to adhesions/scar tissue but overall reasonable breath sounds I will hold off repeat imaging study until her next visit She does not need thoracentesis now  Wound healing, delayed Her chest wall has healed nicely while she was hospitalized and on chronic antibiotics but now she has recurrent open wound again I recommend topical metronidazole gel and daily dressing changes I plan to reassess her wound again next visit  No orders of the defined types were placed in this encounter.   All questions were answered. The patient knows to call the clinic with any problems, questions or concerns. The total time spent in the appointment was 40 minutes encounter with patients including review of chart and various tests results, discussions about plan of care and coordination of care plan   Heath Lark, MD 10/14/2022 3:42 PM  INTERVAL HISTORY: Please see below for problem oriented  charting. she returns for treatment follow-up with her mother She is currently residing in an assisted living facility She has not been doing physical therapy but she is able to walk unassisted She felt stronger She had pressure sore on her buttock area that has now healed She denies shortness of breath on exertion She has noted open chest wound over the past few weeks Her pain is stable  REVIEW OF SYSTEMS:   Constitutional: Denies fevers, chills or abnormal weight loss Eyes: Denies blurriness of vision Ears, nose, mouth, throat, and face: Denies mucositis or sore throat Respiratory: Denies cough, dyspnea or wheezes Cardiovascular: Denies palpitation, chest discomfort or lower extremity swelling Gastrointestinal:  Denies nausea, heartburn or change in bowel habits Lymphatics: Denies new lymphadenopathy or easy bruising Neurological:Denies numbness, tingling or new weaknesses Behavioral/Psych: Mood is stable, no new changes  All other systems were reviewed with the patient and are negative.  I have reviewed the past medical history, past surgical history, social history and family history with the patient and they are unchanged from previous note.  ALLERGIES:  has No Known Allergies.  MEDICATIONS:  Current Outpatient Medications  Medication Sig Dispense Refill   abemaciclib (VERZENIO) 100 MG tablet Take 1 tablet (100 mg total) by mouth 2 (two) times daily. 56 tablet 1   anastrozole (ARIMIDEX) 1 MG tablet Take 1 tablet (1 mg total) by mouth daily. 30 tablet 11   calcium carbonate (TUMS - DOSED IN MG ELEMENTAL CALCIUM) 500 MG chewable tablet Chew 1 tablet by mouth 2 (two) times daily.     cholecalciferol (VITAMIN D3) 25 MCG (1000 UNIT) tablet Take 2,000 Units  by mouth daily.     famotidine (PEPCID) 20 MG tablet Take 1 tablet (20 mg total) by mouth 2 (two) times daily. 60 tablet 2   HYDROmorphone (DILAUDID) 4 MG tablet Take 1 tablet (4 mg total) by mouth every 6 (six) hours as needed  for moderate pain. 90 tablet 0   magnesium oxide (MAG-OX) 400 (240 Mg) MG tablet Take 1 tablet (400 mg total) by mouth daily. 30 tablet 3   megestrol (MEGACE) 40 MG tablet Take 40 mg by mouth 2 (two) times daily.     metoprolol tartrate (LOPRESSOR) 25 MG tablet Take 1 tablet (25 mg total) by mouth 2 (two) times daily. 60 tablet 0   mirtazapine (REMERON) 7.5 MG tablet Take 1 tablet (7.5 mg total) by mouth at bedtime. 30 tablet 1   polyethylene glycol (MIRALAX / GLYCOLAX) 17 g packet Take 17 g by mouth daily.     senna (SENOKOT) 8.6 MG tablet Take 2 tablets by mouth 2 (two) times daily.     No current facility-administered medications for this visit.    SUMMARY OF ONCOLOGIC HISTORY: Oncology History Overview Note  The tumor cells are EQUIVOCAL For Her2 (2+).   Estrogen Receptor:       POSITIVE, 100%, STRONG STAINING  Progesterone Receptor:   NEGATIVE (0)  Proliferation Marker Ki-67:   45%    Breast cancer, stage 4, right ([place])  03/22/2022 Initial Diagnosis   Primary breast malignancy ([place])   03/24/2022 Pathology Results   SURGICAL PATHOLOGY  CASE: [id number]  PATIENT: [name]  Surgical Pathology Report   Clinical History: Ulcerative right breast lesion and probable metastatic breast cancer ([facility])   FINAL MICROSCOPIC DIAGNOSIS:   A. SOFT TISSUE MASS, RIGHT UPPER CHEST WALL, NEEDLE CORE BIOPSY:  - Invasive ductal carcinoma, see comment  - Ductal carcinoma in situ: Not identified   - Tubule formation: Score 3  - Nuclear pleomorphism: Score 3  - Mitotic count: Score 3  - Total score: 9  - Overall Grade: 3  - Lymphovascular invasion: Not identified  - Cancer Length: 13 mm  - Calcifications: Not identified  - Other findings: None    03/28/2022 Cancer Staging   Staging form: Breast, AJCC 8th Edition - Clinical stage from 03/28/2022: Stage IV (cT4d, cN3c, pM1, G3, ER+, PR-, HER2: Equivocal) - Signed by Heath Lark, MD on 03/28/2022 Stage prefix: Initial diagnosis Nuclear  grade: G3 Histologic grading system: 3 grade system   04/17/2022 -  Chemotherapy   She started taking combination of abemaciclib with anastrozole   06/27/2022 Imaging   1. Ulcerated mass of the right breast with extension of nodular enhancing soft tissue to the right chest wall slight extension into the contralateral left breast, markedly decreased in size when compared with prior exam. 2. Interval decreased size of left axillary lymph node. 3. Solid pulmonary nodules are decreased in size when compared with prior exam. 4. Large right pleural effusion with complete collapse of the right lower lobe, pleural effusion is decreased in size when compared with prior exam. 5. Stable low-attenuation liver lesions which are likely simple cysts. 6. Lytic and sclerotic osseous lesions again seen, many of which demonstrate increased sclerosis, likely due to treatment response. 7. Pathologic compression fracture of T3 demonstrates increased height loss, although there is no evidence of significant retropulsion. 8. Large stool ball in the rectum, correlate for constipation.     08/02/2022 - 08/30/2022 Hospital Admission   She was admitted to the hospital due to malignant pleural  effusion and was diagnosed with MSSA empyema   08/10/2022 Imaging   1. Large loculated right hydropneumothorax encompassing about 75% of right hemithoracic volume. Pigtail right pleural drainage catheter in place. 2. Moderate to large left pleural effusion encompassing about 1/3 of the left hemithoracic volume. 3. Right axillary mass or lymph node measures 1.8 cm in short axis, formerly 1.9 cm. Other previously seen chest wall soft tissue lesions are less conspicuous on today's exam due to lack of IV contrast, but likely still present. 4. Bony destructive findings of the right fifth rib laterally compatible with metastatic disease. 5. Extensive sclerosis in the sternum favoring metastatic disease over infection. 6. Nearly complete  central collapse of the T3 vertebral body with associated sclerosis and minimal posterior bony retropulsion, unchanged from prior. 7. Diffuse hazy edema/infiltration of the mediastinal and subcutaneous adipose tissues compatible with third spacing of fluid. 8. Aortic atherosclerosis.       PHYSICAL EXAMINATION: ECOG PERFORMANCE STATUS: 2 - Symptomatic, <50% confined to bed  Vitals:   10/14/22 1240  BP: (!) 159/83  Pulse: (!) 126  Resp: 18  Temp: 97.7 F (36.5 C)  SpO2: 100%   Filed Weights   10/14/22 1240  Weight: 134 lb (60.8 kg)    GENERAL:alert, no distress and comfortable SKIN: There are new reddish skin lesion on her chest wall and open wound on the right upper corner of her chest wall in comparison to previous exam. EYES: normal, Conjunctiva are pink and non-injected, sclera clear OROPHARYNX:no exudate, no erythema and lips, buccal mucosa, and tongue normal  NECK: supple, thyroid normal size, non-tender, without nodularity LYMPH:  no palpable lymphadenopathy in the cervical, axillary or inguinal LUNGS: She has normal breathing effort.  Loud rhonchi in the right lung base HEART: regular rate & rhythm and no murmurs and no lower extremity edema ABDOMEN:abdomen soft, non-tender and normal bowel sounds Musculoskeletal:no cyanosis of digits and no clubbing  NEURO: alert & oriented x 3 with fluent speech, no focal motor/sensory deficits  LABORATORY DATA:  I have reviewed the data as listed    Component Value Date/Time   NA 138 10/14/2022 1201   K 4.2 10/14/2022 1201   CL 107 10/14/2022 1201   CO2 24 10/14/2022 1201   GLUCOSE 69 (L) 10/14/2022 1201   BUN 10 10/14/2022 1201   CREATININE 0.98 10/14/2022 1201   CALCIUM 9.2 10/14/2022 1201   PROT 7.0 10/14/2022 1201   ALBUMIN 3.5 10/14/2022 1201   AST 14 (L) 10/14/2022 1201   ALT 8 10/14/2022 1201   ALKPHOS 50 10/14/2022 1201   BILITOT 0.6 10/14/2022 1201   GFRNONAA >60 10/14/2022 1201    No results found for:  "SPEP", "UPEP"  Lab Results  Component Value Date   WBC 5.5 10/14/2022   NEUTROABS 2.3 10/14/2022   HGB 10.4 (L) 10/14/2022   HCT 31.8 (L) 10/14/2022   MCV 77.6 (L) 10/14/2022   PLT 231 10/14/2022      Chemistry      Component Value Date/Time   NA 138 10/14/2022 1201   K 4.2 10/14/2022 1201   CL 107 10/14/2022 1201   CO2 24 10/14/2022 1201   BUN 10 10/14/2022 1201   CREATININE 0.98 10/14/2022 1201      Component Value Date/Time   CALCIUM 9.2 10/14/2022 1201   ALKPHOS 50 10/14/2022 1201   AST 14 (L) 10/14/2022 1201   ALT 8 10/14/2022 1201   BILITOT 0.6 10/14/2022 1201

## 2022-10-14 NOTE — Assessment & Plan Note (Signed)
When she was hospitalized, the patient had signs and symptoms of narcotic overdose She will continue on reduced dose Dilaudid for now

## 2022-10-14 NOTE — Telephone Encounter (Signed)
Called and spoke with [name] and offered appt at 1 pm today to see Dr. Alvy Bimler, with labs prior to MD appt. [name] will call the office back after speaking with Teryn.  [name] called back and appts scheduled. They are aware of appt times.

## 2022-10-14 NOTE — Assessment & Plan Note (Signed)
Chemotherapy was placed on hold due to recent hospitalization Her infection has resolved I am concerned about progression of disease on her chest wall area  I recommend resumption of abemaciclib in addition to her current antiestrogen therapy She is also overdue for denosumab I plan to see her again next month for further follow-up

## 2022-10-21 ENCOUNTER — Ambulatory Visit: Payer: BC Managed Care – PPO | Admitting: Hematology and Oncology

## 2022-10-28 ENCOUNTER — Other Ambulatory Visit (HOSPITAL_COMMUNITY): Payer: Self-pay

## 2022-10-30 ENCOUNTER — Other Ambulatory Visit (HOSPITAL_COMMUNITY): Payer: Self-pay

## 2022-11-03 ENCOUNTER — Other Ambulatory Visit (HOSPITAL_COMMUNITY): Payer: Self-pay

## 2022-11-06 ENCOUNTER — Inpatient Hospital Stay: Payer: BC Managed Care – PPO | Attending: Hematology and Oncology

## 2022-11-06 ENCOUNTER — Inpatient Hospital Stay: Payer: BC Managed Care – PPO | Admitting: Hematology and Oncology

## 2022-11-06 ENCOUNTER — Other Ambulatory Visit: Payer: Self-pay

## 2022-11-06 ENCOUNTER — Telehealth: Payer: Self-pay

## 2022-11-06 ENCOUNTER — Encounter: Payer: Self-pay | Admitting: Hematology and Oncology

## 2022-11-06 VITALS — BP 134/87 | HR 111 | Resp 18 | Ht 66.0 in | Wt 135.6 lb

## 2022-11-06 DIAGNOSIS — R222 Localized swelling, mass and lump, trunk: Secondary | ICD-10-CM | POA: Diagnosis not present

## 2022-11-06 DIAGNOSIS — J910 Malignant pleural effusion: Secondary | ICD-10-CM | POA: Insufficient documentation

## 2022-11-06 DIAGNOSIS — Z170 Estrogen receptor positive status [ER+]: Secondary | ICD-10-CM | POA: Insufficient documentation

## 2022-11-06 DIAGNOSIS — Z79811 Long term (current) use of aromatase inhibitors: Secondary | ICD-10-CM | POA: Diagnosis not present

## 2022-11-06 DIAGNOSIS — C50911 Malignant neoplasm of unspecified site of right female breast: Secondary | ICD-10-CM

## 2022-11-06 DIAGNOSIS — Z79899 Other long term (current) drug therapy: Secondary | ICD-10-CM | POA: Diagnosis not present

## 2022-11-06 DIAGNOSIS — J90 Pleural effusion, not elsewhere classified: Secondary | ICD-10-CM

## 2022-11-06 DIAGNOSIS — I700 Atherosclerosis of aorta: Secondary | ICD-10-CM | POA: Insufficient documentation

## 2022-11-06 DIAGNOSIS — J948 Other specified pleural conditions: Secondary | ICD-10-CM | POA: Insufficient documentation

## 2022-11-06 DIAGNOSIS — K769 Liver disease, unspecified: Secondary | ICD-10-CM | POA: Diagnosis not present

## 2022-11-06 DIAGNOSIS — J869 Pyothorax without fistula: Secondary | ICD-10-CM | POA: Diagnosis not present

## 2022-11-06 DIAGNOSIS — E440 Moderate protein-calorie malnutrition: Secondary | ICD-10-CM | POA: Diagnosis not present

## 2022-11-06 DIAGNOSIS — G893 Neoplasm related pain (acute) (chronic): Secondary | ICD-10-CM | POA: Diagnosis not present

## 2022-11-06 DIAGNOSIS — D509 Iron deficiency anemia, unspecified: Secondary | ICD-10-CM

## 2022-11-06 DIAGNOSIS — M4854XA Collapsed vertebra, not elsewhere classified, thoracic region, initial encounter for fracture: Secondary | ICD-10-CM | POA: Diagnosis not present

## 2022-11-06 LAB — CBC WITH DIFFERENTIAL/PLATELET
Abs Immature Granulocytes: 0.01 10*3/uL (ref 0.00–0.07)
Basophils Absolute: 0.1 10*3/uL (ref 0.0–0.1)
Basophils Relative: 2 %
Eosinophils Absolute: 0 10*3/uL (ref 0.0–0.5)
Eosinophils Relative: 1 %
HCT: 32.5 % — ABNORMAL LOW (ref 36.0–46.0)
Hemoglobin: 10.6 g/dL — ABNORMAL LOW (ref 12.0–15.0)
Immature Granulocytes: 0 %
Lymphocytes Relative: 46 %
Lymphs Abs: 1.7 10*3/uL (ref 0.7–4.0)
MCH: 25.4 pg — ABNORMAL LOW (ref 26.0–34.0)
MCHC: 32.6 g/dL (ref 30.0–36.0)
MCV: 77.9 fL — ABNORMAL LOW (ref 80.0–100.0)
Monocytes Absolute: 0.2 10*3/uL (ref 0.1–1.0)
Monocytes Relative: 5 %
Neutro Abs: 1.8 10*3/uL (ref 1.7–7.7)
Neutrophils Relative %: 46 %
Platelets: 346 10*3/uL (ref 150–400)
RBC: 4.17 MIL/uL (ref 3.87–5.11)
RDW: 21.6 % — ABNORMAL HIGH (ref 11.5–15.5)
WBC: 3.8 10*3/uL — ABNORMAL LOW (ref 4.0–10.5)
nRBC: 0 % (ref 0.0–0.2)

## 2022-11-06 LAB — COMPREHENSIVE METABOLIC PANEL
ALT: 16 U/L (ref 0–44)
AST: 21 U/L (ref 15–41)
Albumin: 3.7 g/dL (ref 3.5–5.0)
Alkaline Phosphatase: 50 U/L (ref 38–126)
Anion gap: 7 (ref 5–15)
BUN: 10 mg/dL (ref 8–23)
CO2: 25 mmol/L (ref 22–32)
Calcium: 8.8 mg/dL — ABNORMAL LOW (ref 8.9–10.3)
Chloride: 107 mmol/L (ref 98–111)
Creatinine, Ser: 0.98 mg/dL (ref 0.44–1.00)
GFR, Estimated: >60 mL/min (ref >60)
Glucose, Bld: 95 mg/dL (ref 70–99)
Potassium: 4.4 mmol/L (ref 3.5–5.1)
Sodium: 139 mmol/L (ref 135–145)
Total Bilirubin: 0.4 mg/dL (ref 0.3–1.2)
Total Protein: 6.8 g/dL (ref 6.5–8.1)

## 2022-11-06 LAB — SAMPLE TO BLOOD BANK

## 2022-11-06 NOTE — Assessment & Plan Note (Signed)
She has no signs of recurrent pleural effusion Monitor clinically 

## 2022-11-06 NOTE — Assessment & Plan Note (Signed)
She has not lost weight She requested prescription for nutritional supplement and I have provided that for her According to the patient, she will try her best to eat more in the near future We discussed importance of adequate nutrition for wound healing

## 2022-11-06 NOTE — Assessment & Plan Note (Signed)
Her pain is well-controlled She will continue on reduced dose Dilaudid for now 

## 2022-11-06 NOTE — Progress Notes (Signed)
[place] OFFICE PROGRESS NOTE  Patient Care Team: Heath Lark, MD as PCP - General (Hematology and Oncology)  ASSESSMENT & PLAN:  Breast cancer, stage 4, right ([place]) Overall, she is responding to reintroduction of abemaciclib Recurrent subcutaneous skin nodules that represent her disease is regressing Her contralateral breast appears to be normalizing with softer tissue Her ulcerated wound on the upper part of her chest near her clavicle is healing well with healthy granulation tissue There is a separate area of ulcerated wound near the axilla which is new, likely traumatic in nature I placed clean bandages over the wound and recommend dry dressing changes I plan to see her again in 3 weeks for further follow-up  Malnutrition of moderate degree She has not lost weight She requested prescription for nutritional supplement and I have provided that for her According to the patient, she will try her best to eat more in the near future We discussed importance of adequate nutrition for wound healing  Cancer associated pain Her pain is well-controlled She will continue on reduced dose Dilaudid for now  Pleural effusion She has no signs of recurrent pleural effusion Monitor clinically  No orders of the defined types were placed in this encounter.   All questions were answered. The patient knows to call the clinic with any problems, questions or concerns. The total time spent in the appointment was 30 minutes encounter with patients including review of chart and various tests results, discussions about plan of care and coordination of care plan   Heath Lark, MD 11/06/2022 2:44 PM  INTERVAL HISTORY: Please see below for problem oriented charting. she returns for treatment follow-up with her mother She has questions related to additional nutritional supplement She is hiring a Biomedical scientist to [employer] for her 2-3 times a week starting next week Her pain is well-controlled She  denies recent constipation Denies recent cough or shortness of breath No recent bleeding She is compliant taking all her medications as prescribed  REVIEW OF SYSTEMS:   Constitutional: Denies fevers, chills or abnormal weight loss Eyes: Denies blurriness of vision Ears, nose, mouth, throat, and face: Denies mucositis or sore throat Respiratory: Denies cough, dyspnea or wheezes Cardiovascular: Denies palpitation, chest discomfort or lower extremity swelling Gastrointestinal:  Denies nausea, heartburn or change in bowel habits Lymphatics: Denies new lymphadenopathy or easy bruising Neurological:Denies numbness, tingling or new weaknesses Behavioral/Psych: Mood is stable, no new changes  All other systems were reviewed with the patient and are negative.  I have reviewed the past medical history, past surgical history, social history and family history with the patient and they are unchanged from previous note.  ALLERGIES:  has No Known Allergies.  MEDICATIONS:  Current Outpatient Medications  Medication Sig Dispense Refill   abemaciclib (VERZENIO) 100 MG tablet Take 1 tablet (100 mg total) by mouth 2 (two) times daily. 56 tablet 1   anastrozole (ARIMIDEX) 1 MG tablet Take 1 tablet (1 mg total) by mouth daily. 30 tablet 11   calcium carbonate (TUMS - DOSED IN MG ELEMENTAL CALCIUM) 500 MG chewable tablet Chew 1 tablet by mouth 2 (two) times daily.     cholecalciferol (VITAMIN D3) 25 MCG (1000 UNIT) tablet Take 2,000 Units by mouth daily.     famotidine (PEPCID) 20 MG tablet Take 1 tablet (20 mg total) by mouth 2 (two) times daily. 60 tablet 2   HYDROmorphone (DILAUDID) 4 MG tablet Take 1 tablet (4 mg total) by mouth every 6 (six) hours as needed for  moderate pain. 90 tablet 0   magnesium oxide (MAG-OX) 400 (240 Mg) MG tablet Take 1 tablet (400 mg total) by mouth daily. 30 tablet 3   megestrol (MEGACE) 40 MG tablet Take 40 mg by mouth 2 (two) times daily.     metoprolol tartrate (LOPRESSOR)  25 MG tablet Take 1 tablet (25 mg total) by mouth 2 (two) times daily. 60 tablet 0   mirtazapine (REMERON) 7.5 MG tablet Take 1 tablet (7.5 mg total) by mouth at bedtime. 30 tablet 1   polyethylene glycol (MIRALAX / GLYCOLAX) 17 g packet Take 17 g by mouth daily.     senna (SENOKOT) 8.6 MG tablet Take 2 tablets by mouth 2 (two) times daily.     No current facility-administered medications for this visit.    SUMMARY OF ONCOLOGIC HISTORY: Oncology History Overview Note  The tumor cells are EQUIVOCAL For Her2 (2+).   Estrogen Receptor:       POSITIVE, 100%, STRONG STAINING  Progesterone Receptor:   NEGATIVE (0)  Proliferation Marker Ki-67:   45%    Breast cancer, stage 4, right ([place])  03/22/2022 Initial Diagnosis   Primary breast malignancy ([place])   03/24/2022 Pathology Results   SURGICAL PATHOLOGY  CASE: [id number]  PATIENT: [name]  Surgical Pathology Report   Clinical History: Ulcerative right breast lesion and probable metastatic breast cancer ([facility])   FINAL MICROSCOPIC DIAGNOSIS:   A. SOFT TISSUE MASS, RIGHT UPPER CHEST WALL, NEEDLE CORE BIOPSY:  - Invasive ductal carcinoma, see comment  - Ductal carcinoma in situ: Not identified   - Tubule formation: Score 3  - Nuclear pleomorphism: Score 3  - Mitotic count: Score 3  - Total score: 9  - Overall Grade: 3  - Lymphovascular invasion: Not identified  - Cancer Length: 13 mm  - Calcifications: Not identified  - Other findings: None    03/28/2022 Cancer Staging   Staging form: Breast, AJCC 8th Edition - Clinical stage from 03/28/2022: Stage IV (cT4d, cN3c, pM1, G3, ER+, PR-, HER2: Equivocal) - Signed by Heath Lark, MD on 03/28/2022 Stage prefix: Initial diagnosis Nuclear grade: G3 Histologic grading system: 3 grade system   04/17/2022 -  Chemotherapy   She started taking combination of abemaciclib with anastrozole   06/27/2022 Imaging   1. Ulcerated mass of the right breast with extension of nodular enhancing  soft tissue to the right chest wall slight extension into the contralateral left breast, markedly decreased in size when compared with prior exam. 2. Interval decreased size of left axillary lymph node. 3. Solid pulmonary nodules are decreased in size when compared with prior exam. 4. Large right pleural effusion with complete collapse of the right lower lobe, pleural effusion is decreased in size when compared with prior exam. 5. Stable low-attenuation liver lesions which are likely simple cysts. 6. Lytic and sclerotic osseous lesions again seen, many of which demonstrate increased sclerosis, likely due to treatment response. 7. Pathologic compression fracture of T3 demonstrates increased height loss, although there is no evidence of significant retropulsion. 8. Large stool ball in the rectum, correlate for constipation.     08/02/2022 - 08/30/2022 Hospital Admission   She was admitted to the hospital due to malignant pleural effusion and was diagnosed with MSSA empyema   08/10/2022 Imaging   1. Large loculated right hydropneumothorax encompassing about 75% of right hemithoracic volume. Pigtail right pleural drainage catheter in place. 2. Moderate to large left pleural effusion encompassing about 1/3 of the left hemithoracic volume. 3. Right  axillary mass or lymph node measures 1.8 cm in short axis, formerly 1.9 cm. Other previously seen chest wall soft tissue lesions are less conspicuous on today's exam due to lack of IV contrast, but likely still present. 4. Bony destructive findings of the right fifth rib laterally compatible with metastatic disease. 5. Extensive sclerosis in the sternum favoring metastatic disease over infection. 6. Nearly complete central collapse of the T3 vertebral body with associated sclerosis and minimal posterior bony retropulsion, unchanged from prior. 7. Diffuse hazy edema/infiltration of the mediastinal and subcutaneous adipose tissues compatible with third spacing  of fluid. 8. Aortic atherosclerosis.       PHYSICAL EXAMINATION: ECOG PERFORMANCE STATUS: 2 - Symptomatic, <50% confined to bed  Vitals:   11/06/22 1349  BP: 134/87  Pulse: (!) 111  Resp: 18  SpO2: 97%   Filed Weights   11/06/22 1349  Weight: 135 lb 9.6 oz (61.5 kg)    GENERAL:alert, no distress and comfortable.  She looks thin SKIN: Careful examination of her right chest wall revealed 2 ulcerated wound, 1 on the upper chest and another area near the axilla.  Previously noted subcutaneous skin nodules are regressing.  Contralateral breast appear normalizing into softer tissue EYES: normal, Conjunctiva are pink and non-injected, sclera clear OROPHARYNX:no exudate, no erythema and lips, buccal mucosa, and tongue normal  NECK: supple, thyroid normal size, non-tender, without nodularity LYMPH:  no palpable lymphadenopathy in the cervical, axillary or inguinal LUNGS: clear to auscultation and percussion with normal breathing effort HEART: regular rate & rhythm and no murmurs and no lower extremity edema ABDOMEN:abdomen soft, non-tender and normal bowel sounds Musculoskeletal:no cyanosis of digits and no clubbing  NEURO: alert & oriented x 3 with fluent speech, no focal motor/sensory deficits  LABORATORY DATA:  I have reviewed the data as listed    Component Value Date/Time   NA 139 11/06/2022 1323   K 4.4 11/06/2022 1323   CL 107 11/06/2022 1323   CO2 25 11/06/2022 1323   GLUCOSE 95 11/06/2022 1323   BUN 10 11/06/2022 1323   CREATININE 0.98 11/06/2022 1323   CALCIUM 8.8 (L) 11/06/2022 1323   PROT 6.8 11/06/2022 1323   ALBUMIN 3.7 11/06/2022 1323   AST 21 11/06/2022 1323   ALT 16 11/06/2022 1323   ALKPHOS 50 11/06/2022 1323   BILITOT 0.4 11/06/2022 1323   GFRNONAA >60 11/06/2022 1323    No results found for: "SPEP", "UPEP"  Lab Results  Component Value Date   WBC 3.8 (L) 11/06/2022   NEUTROABS 1.8 11/06/2022   HGB 10.6 (L) 11/06/2022   HCT 32.5 (L) 11/06/2022    MCV 77.9 (L) 11/06/2022   PLT 346 11/06/2022      Chemistry      Component Value Date/Time   NA 139 11/06/2022 1323   K 4.4 11/06/2022 1323   CL 107 11/06/2022 1323   CO2 25 11/06/2022 1323   BUN 10 11/06/2022 1323   CREATININE 0.98 11/06/2022 1323      Component Value Date/Time   CALCIUM 8.8 (L) 11/06/2022 1323   ALKPHOS 50 11/06/2022 1323   AST 21 11/06/2022 1323   ALT 16 11/06/2022 1323   BILITOT 0.4 11/06/2022 1323

## 2022-11-06 NOTE — Assessment & Plan Note (Signed)
Overall, she is responding to reintroduction of abemaciclib Recurrent subcutaneous skin nodules that represent her disease is regressing Her contralateral breast appears to be normalizing with softer tissue Her ulcerated wound on the upper part of her chest near her clavicle is healing well with healthy granulation tissue There is a separate area of ulcerated wound near the axilla which is new, likely traumatic in nature I placed clean bandages over the wound and recommend dry dressing changes I plan to see her again in 3 weeks for further follow-up

## 2022-11-06 NOTE — Telephone Encounter (Signed)
Returned call to JAARS. She is asking if Dr. Alvy Bimler needs to order special labs. Told her no she will just order the same labs and review everything at the office visit. She verbalized understanding.

## 2022-11-12 ENCOUNTER — Other Ambulatory Visit (HOSPITAL_COMMUNITY): Payer: Self-pay

## 2022-11-21 ENCOUNTER — Other Ambulatory Visit (HOSPITAL_COMMUNITY): Payer: Self-pay

## 2022-11-25 ENCOUNTER — Other Ambulatory Visit (HOSPITAL_COMMUNITY): Payer: Self-pay

## 2022-11-25 ENCOUNTER — Other Ambulatory Visit: Payer: Self-pay

## 2022-11-28 ENCOUNTER — Inpatient Hospital Stay: Payer: BC Managed Care – PPO | Attending: Hematology and Oncology | Admitting: Hematology and Oncology

## 2022-11-28 ENCOUNTER — Inpatient Hospital Stay: Payer: BC Managed Care – PPO

## 2022-11-28 ENCOUNTER — Encounter: Payer: Self-pay | Admitting: Hematology and Oncology

## 2022-11-28 ENCOUNTER — Other Ambulatory Visit: Payer: Self-pay

## 2022-11-28 ENCOUNTER — Other Ambulatory Visit (HOSPITAL_COMMUNITY): Payer: Self-pay

## 2022-11-28 VITALS — BP 159/95 | HR 111 | Temp 98.1°F | Resp 18 | Ht 66.0 in | Wt 139.6 lb

## 2022-11-28 DIAGNOSIS — J869 Pyothorax without fistula: Secondary | ICD-10-CM | POA: Insufficient documentation

## 2022-11-28 DIAGNOSIS — D509 Iron deficiency anemia, unspecified: Secondary | ICD-10-CM

## 2022-11-28 DIAGNOSIS — Z79811 Long term (current) use of aromatase inhibitors: Secondary | ICD-10-CM | POA: Diagnosis not present

## 2022-11-28 DIAGNOSIS — G893 Neoplasm related pain (acute) (chronic): Secondary | ICD-10-CM | POA: Diagnosis not present

## 2022-11-28 DIAGNOSIS — M4854XA Collapsed vertebra, not elsewhere classified, thoracic region, initial encounter for fracture: Secondary | ICD-10-CM | POA: Diagnosis not present

## 2022-11-28 DIAGNOSIS — R635 Abnormal weight gain: Secondary | ICD-10-CM | POA: Insufficient documentation

## 2022-11-28 DIAGNOSIS — I700 Atherosclerosis of aorta: Secondary | ICD-10-CM | POA: Insufficient documentation

## 2022-11-28 DIAGNOSIS — J910 Malignant pleural effusion: Secondary | ICD-10-CM | POA: Insufficient documentation

## 2022-11-28 DIAGNOSIS — C7951 Secondary malignant neoplasm of bone: Secondary | ICD-10-CM

## 2022-11-28 DIAGNOSIS — Z170 Estrogen receptor positive status [ER+]: Secondary | ICD-10-CM | POA: Insufficient documentation

## 2022-11-28 DIAGNOSIS — Z79899 Other long term (current) drug therapy: Secondary | ICD-10-CM | POA: Diagnosis not present

## 2022-11-28 DIAGNOSIS — C50911 Malignant neoplasm of unspecified site of right female breast: Secondary | ICD-10-CM

## 2022-11-28 DIAGNOSIS — J948 Other specified pleural conditions: Secondary | ICD-10-CM | POA: Insufficient documentation

## 2022-11-28 DIAGNOSIS — J90 Pleural effusion, not elsewhere classified: Secondary | ICD-10-CM | POA: Diagnosis not present

## 2022-11-28 LAB — CMP (CANCER CENTER ONLY)
ALT: 13 U/L (ref 0–44)
AST: 22 U/L (ref 15–41)
Albumin: 3.8 g/dL (ref 3.5–5.0)
Alkaline Phosphatase: 46 U/L (ref 38–126)
Anion gap: 9 (ref 5–15)
BUN: 13 mg/dL (ref 8–23)
CO2: 24 mmol/L (ref 22–32)
Calcium: 8.4 mg/dL — ABNORMAL LOW (ref 8.9–10.3)
Chloride: 107 mmol/L (ref 98–111)
Creatinine: 1.03 mg/dL — ABNORMAL HIGH (ref 0.44–1.00)
GFR, Estimated: >60 mL/min (ref >60)
Glucose, Bld: 84 mg/dL (ref 70–99)
Potassium: 3.9 mmol/L (ref 3.5–5.1)
Sodium: 140 mmol/L (ref 135–145)
Total Bilirubin: 0.5 mg/dL (ref 0.3–1.2)
Total Protein: 7.6 g/dL (ref 6.5–8.1)

## 2022-11-28 LAB — CBC WITH DIFFERENTIAL/PLATELET
Abs Immature Granulocytes: 0.01 10*3/uL (ref 0.00–0.07)
Basophils Absolute: 0.1 10*3/uL (ref 0.0–0.1)
Basophils Relative: 2 %
Eosinophils Absolute: 0.1 10*3/uL (ref 0.0–0.5)
Eosinophils Relative: 2 %
HCT: 34.7 % — ABNORMAL LOW (ref 36.0–46.0)
Hemoglobin: 10.9 g/dL — ABNORMAL LOW (ref 12.0–15.0)
Immature Granulocytes: 0 %
Lymphocytes Relative: 40 %
Lymphs Abs: 1.6 10*3/uL (ref 0.7–4.0)
MCH: 25 pg — ABNORMAL LOW (ref 26.0–34.0)
MCHC: 31.4 g/dL (ref 30.0–36.0)
MCV: 79.6 fL — ABNORMAL LOW (ref 80.0–100.0)
Monocytes Absolute: 0.2 10*3/uL (ref 0.1–1.0)
Monocytes Relative: 6 %
Neutro Abs: 2 10*3/uL (ref 1.7–7.7)
Neutrophils Relative %: 50 %
Platelets: 340 10*3/uL (ref 150–400)
RBC: 4.36 MIL/uL (ref 3.87–5.11)
RDW: 22.3 % — ABNORMAL HIGH (ref 11.5–15.5)
WBC: 4 10*3/uL (ref 4.0–10.5)
nRBC: 0 % (ref 0.0–0.2)

## 2022-11-28 LAB — SAMPLE TO BLOOD BANK

## 2022-11-28 NOTE — Assessment & Plan Note (Signed)
She has no signs of recurrent pleural effusion Monitor clinically

## 2022-11-28 NOTE — Assessment & Plan Note (Signed)
She is reminded to take vitamin D and calcium She has obtained dental clearance She will continue Xgeva every 3 months, next due in April

## 2022-11-28 NOTE — Progress Notes (Signed)
[place] OFFICE PROGRESS NOTE  Patient Care Team: Heath Lark, MD as PCP - General (Hematology and Oncology)  ASSESSMENT & PLAN:  Breast cancer, stage 4, right ([place]) Overall, she is responding to reintroduction of abemaciclib Recurrent subcutaneous skin nodules that represent her disease is regressing Her contralateral breast appears to be normalizing with softer tissue Her ulcerated wound on the upper part of her chest near her clavicle is healing well with healthy granulation tissue The separate area of ulcerated wound near the axilla is healing well and the skin is dry I placed clean bandages over the wound and recommend dry dressing changes I plan to see her again next month for further follow-up I spent some time explaining to the patient's family the rationale of not ordering tumor marker or imaging studies to follow  Pleural effusion She has no signs of recurrent pleural effusion Monitor clinically  Cancer associated pain Her pain is well-controlled She will continue on reduced dose Dilaudid for now  Breast cancer metastasized to bone, right ([place]) She is reminded to take vitamin D and calcium She has obtained dental clearance She will continue Xgeva every 3 months, next due in April  No orders of the defined types were placed in this encounter.   All questions were answered. The patient knows to call the clinic with any problems, questions or concerns. The total time spent in the appointment was 40 minutes encounter with patients including review of chart and various tests results, discussions about plan of care and coordination of care plan   Heath Lark, MD 11/28/2022 3:25 PM  INTERVAL HISTORY: Please see below for problem oriented charting. she returns for treatment follow-up with her mother and sister She is getting better She is eating well and gaining weight Her wound is healing well Her pain is stable She denies recent cough, chest pain or  shortness of breath  REVIEW OF SYSTEMS:   Constitutional: Denies fevers, chills or abnormal weight loss Eyes: Denies blurriness of vision Ears, nose, mouth, throat, and face: Denies mucositis or sore throat Respiratory: Denies cough, dyspnea or wheezes Cardiovascular: Denies palpitation, chest discomfort or lower extremity swelling Gastrointestinal:  Denies nausea, heartburn or change in bowel habits Skin: Denies abnormal skin rashes Lymphatics: Denies new lymphadenopathy or easy bruising Neurological:Denies numbness, tingling or new weaknesses Behavioral/Psych: Mood is stable, no new changes  All other systems were reviewed with the patient and are negative.  I have reviewed the past medical history, past surgical history, social history and family history with the patient and they are unchanged from previous note.  ALLERGIES:  has No Known Allergies.  MEDICATIONS:  Current Outpatient Medications  Medication Sig Dispense Refill   abemaciclib (VERZENIO) 100 MG tablet Take 1 tablet (100 mg total) by mouth 2 (two) times daily. 56 tablet 1   anastrozole (ARIMIDEX) 1 MG tablet Take 1 tablet (1 mg total) by mouth daily. 30 tablet 11   calcium carbonate (TUMS - DOSED IN MG ELEMENTAL CALCIUM) 500 MG chewable tablet Chew 1 tablet by mouth 2 (two) times daily.     cholecalciferol (VITAMIN D3) 25 MCG (1000 UNIT) tablet Take 2,000 Units by mouth daily.     famotidine (PEPCID) 20 MG tablet Take 1 tablet (20 mg total) by mouth 2 (two) times daily. 60 tablet 2   HYDROmorphone (DILAUDID) 4 MG tablet Take 1 tablet (4 mg total) by mouth every 6 (six) hours as needed for moderate pain. 90 tablet 0   magnesium oxide (MAG-OX)  400 (240 Mg) MG tablet Take 1 tablet (400 mg total) by mouth daily. 30 tablet 3   megestrol (MEGACE) 40 MG tablet Take 40 mg by mouth 2 (two) times daily.     metoprolol tartrate (LOPRESSOR) 25 MG tablet Take 1 tablet (25 mg total) by mouth 2 (two) times daily. 60 tablet 0    mirtazapine (REMERON) 7.5 MG tablet Take 1 tablet (7.5 mg total) by mouth at bedtime. 30 tablet 1   polyethylene glycol (MIRALAX / GLYCOLAX) 17 g packet Take 17 g by mouth daily.     senna (SENOKOT) 8.6 MG tablet Take 2 tablets by mouth 2 (two) times daily.     No current facility-administered medications for this visit.    SUMMARY OF ONCOLOGIC HISTORY: Oncology History Overview Note  The tumor cells are EQUIVOCAL For Her2 (2+).   Estrogen Receptor:       POSITIVE, 100%, STRONG STAINING  Progesterone Receptor:   NEGATIVE (0)  Proliferation Marker Ki-67:   45%    Breast cancer, stage 4, right ([place])  03/22/2022 Initial Diagnosis   Primary breast malignancy ([place])   03/24/2022 Pathology Results   SURGICAL PATHOLOGY  CASE: [id number]  PATIENT: [name]  Surgical Pathology Report   Clinical History: Ulcerative right breast lesion and probable metastatic breast cancer ([facility])   FINAL MICROSCOPIC DIAGNOSIS:   A. SOFT TISSUE MASS, RIGHT UPPER CHEST WALL, NEEDLE CORE BIOPSY:  - Invasive ductal carcinoma, see comment  - Ductal carcinoma in situ: Not identified   - Tubule formation: Score 3  - Nuclear pleomorphism: Score 3  - Mitotic count: Score 3  - Total score: 9  - Overall Grade: 3  - Lymphovascular invasion: Not identified  - Cancer Length: 13 mm  - Calcifications: Not identified  - Other findings: None    03/28/2022 Cancer Staging   Staging form: Breast, AJCC 8th Edition - Clinical stage from 03/28/2022: Stage IV (cT4d, cN3c, pM1, G3, ER+, PR-, HER2: Equivocal) - Signed by Heath Lark, MD on 03/28/2022 Stage prefix: Initial diagnosis Nuclear grade: G3 Histologic grading system: 3 grade system   04/17/2022 -  Chemotherapy   She started taking combination of abemaciclib with anastrozole   06/27/2022 Imaging   1. Ulcerated mass of the right breast with extension of nodular enhancing soft tissue to the right chest wall slight extension into the contralateral left breast,  markedly decreased in size when compared with prior exam. 2. Interval decreased size of left axillary lymph node. 3. Solid pulmonary nodules are decreased in size when compared with prior exam. 4. Large right pleural effusion with complete collapse of the right lower lobe, pleural effusion is decreased in size when compared with prior exam. 5. Stable low-attenuation liver lesions which are likely simple cysts. 6. Lytic and sclerotic osseous lesions again seen, many of which demonstrate increased sclerosis, likely due to treatment response. 7. Pathologic compression fracture of T3 demonstrates increased height loss, although there is no evidence of significant retropulsion. 8. Large stool ball in the rectum, correlate for constipation.     08/02/2022 - 08/30/2022 Hospital Admission   She was admitted to the hospital due to malignant pleural effusion and was diagnosed with MSSA empyema   08/10/2022 Imaging   1. Large loculated right hydropneumothorax encompassing about 75% of right hemithoracic volume. Pigtail right pleural drainage catheter in place. 2. Moderate to large left pleural effusion encompassing about 1/3 of the left hemithoracic volume. 3. Right axillary mass or lymph node measures 1.8 cm in short  axis, formerly 1.9 cm. Other previously seen chest wall soft tissue lesions are less conspicuous on today's exam due to lack of IV contrast, but likely still present. 4. Bony destructive findings of the right fifth rib laterally compatible with metastatic disease. 5. Extensive sclerosis in the sternum favoring metastatic disease over infection. 6. Nearly complete central collapse of the T3 vertebral body with associated sclerosis and minimal posterior bony retropulsion, unchanged from prior. 7. Diffuse hazy edema/infiltration of the mediastinal and subcutaneous adipose tissues compatible with third spacing of fluid. 8. Aortic atherosclerosis.       PHYSICAL EXAMINATION: ECOG PERFORMANCE  STATUS: 1 - Symptomatic but completely ambulatory  Vitals:   11/28/22 1321  BP: (!) 159/95  Pulse: (!) 111  Resp: 18  Temp: 98.1 F (36.7 C)  SpO2: 99%   Filed Weights   11/28/22 1321  Weight: 139 lb 9.6 oz (63.3 kg)    GENERAL:alert, no distress and comfortable SKIN her chest wall is examined.  The ulcerated area near the clavicle is healing well with healthy granulation tissue.  The area near the axilla is healing.  Multiple small cutaneous lesion is regressing.  Her contralateral breast is soft and feels more normal EYES: normal, Conjunctiva are pink and non-injected, sclera clear OROPHARYNX:no exudate, no erythema and lips, buccal mucosa, and tongue normal  NECK: supple, thyroid normal size, non-tender, without nodularity LYMPH:  no palpable lymphadenopathy in the cervical, axillary or inguinal LUNGS: clear to auscultation and percussion with normal breathing effort HEART: regular rate & rhythm and no murmurs and no lower extremity edema.  Previous lymphedema of the right arm is resolving ABDOMEN:abdomen soft, non-tender and normal bowel sounds Musculoskeletal:no cyanosis of digits and no clubbing  NEURO: alert & oriented x 3 with fluent speech, no focal motor/sensory deficits  LABORATORY DATA:  I have reviewed the data as listed    Component Value Date/Time   NA 140 11/28/2022 1242   K 3.9 11/28/2022 1242   CL 107 11/28/2022 1242   CO2 24 11/28/2022 1242   GLUCOSE 84 11/28/2022 1242   BUN 13 11/28/2022 1242   CREATININE 1.03 (H) 11/28/2022 1242   CALCIUM 8.4 (L) 11/28/2022 1242   PROT 7.6 11/28/2022 1242   ALBUMIN 3.8 11/28/2022 1242   AST 22 11/28/2022 1242   ALT 13 11/28/2022 1242   ALKPHOS 46 11/28/2022 1242   BILITOT 0.5 11/28/2022 1242   GFRNONAA >60 11/28/2022 1242    No results found for: "SPEP", "UPEP"  Lab Results  Component Value Date   WBC 4.0 11/28/2022   NEUTROABS 2.0 11/28/2022   HGB 10.9 (L) 11/28/2022   HCT 34.7 (L) 11/28/2022   MCV 79.6  (L) 11/28/2022   PLT 340 11/28/2022      Chemistry      Component Value Date/Time   NA 140 11/28/2022 1242   K 3.9 11/28/2022 1242   CL 107 11/28/2022 1242   CO2 24 11/28/2022 1242   BUN 13 11/28/2022 1242   CREATININE 1.03 (H) 11/28/2022 1242      Component Value Date/Time   CALCIUM 8.4 (L) 11/28/2022 1242   ALKPHOS 46 11/28/2022 1242   AST 22 11/28/2022 1242   ALT 13 11/28/2022 1242   BILITOT 0.5 11/28/2022 1242

## 2022-11-28 NOTE — Assessment & Plan Note (Signed)
Her pain is well-controlled She will continue on reduced dose Dilaudid for now

## 2022-11-28 NOTE — Assessment & Plan Note (Signed)
Overall, she is responding to reintroduction of abemaciclib Recurrent subcutaneous skin nodules that represent her disease is regressing Her contralateral breast appears to be normalizing with softer tissue Her ulcerated wound on the upper part of her chest near her clavicle is healing well with healthy granulation tissue The separate area of ulcerated wound near the axilla is healing well and the skin is dry I placed clean bandages over the wound and recommend dry dressing changes I plan to see her again next month for further follow-up I spent some time explaining to the patient's family the rationale of not ordering tumor marker or imaging studies to follow

## 2022-12-11 ENCOUNTER — Other Ambulatory Visit: Payer: Self-pay

## 2022-12-11 ENCOUNTER — Telehealth: Payer: Self-pay

## 2022-12-11 MED ORDER — METRONIDAZOLE 1 % EX GEL
Freq: Two times a day (BID) | CUTANEOUS | 0 refills | Status: DC
  Filled 2022-12-11: qty 60, 30d supply, fill #0

## 2022-12-11 NOTE — Telephone Encounter (Signed)
Call in metronidazole gel 1% apply BID for 2 weeks supply

## 2022-12-11 NOTE — Telephone Encounter (Signed)
Called and given below message. She verbalzied understanding and appreciated the call. Rx sent to pharmacy.

## 2022-12-11 NOTE — Telephone Encounter (Signed)
Returned her call. She is asking for antibiotic to [place]. The right breast area is painful and she is having a hard time sleeping due to positioning. Denies fever and the drainage is unchanged. The wound is still trying to heal.

## 2022-12-12 ENCOUNTER — Telehealth: Payer: Self-pay

## 2022-12-12 NOTE — Telephone Encounter (Signed)
Called [name] regarding after hours call last pm to the answering service. Her mother is requesting oral antibiotic. Told her Dr. Alvy Bimler is willing to send a 1 week supply of antibiotics if she wants. Dr. Alvy Bimler worries about antibiotic resistance and C-diff due to chronic wound.  [name] declined antibiotic for now and started using the Metrogel BID yesterday. She will call the office back if she changes her mind and for questions.

## 2022-12-18 ENCOUNTER — Other Ambulatory Visit (HOSPITAL_COMMUNITY): Payer: Self-pay

## 2022-12-22 ENCOUNTER — Other Ambulatory Visit (HOSPITAL_COMMUNITY): Payer: Self-pay

## 2022-12-22 ENCOUNTER — Other Ambulatory Visit: Payer: Self-pay | Admitting: Hematology and Oncology

## 2022-12-22 ENCOUNTER — Other Ambulatory Visit: Payer: Self-pay

## 2022-12-22 MED ORDER — ABEMACICLIB 100 MG PO TABS
100.0000 mg | ORAL_TABLET | Freq: Two times a day (BID) | ORAL | 1 refills | Status: DC
  Filled 2022-12-22 – 2022-12-31 (×2): qty 56, 28d supply, fill #0

## 2022-12-25 ENCOUNTER — Other Ambulatory Visit: Payer: Self-pay

## 2022-12-25 ENCOUNTER — Other Ambulatory Visit (HOSPITAL_COMMUNITY): Payer: Self-pay

## 2022-12-30 ENCOUNTER — Telehealth: Payer: Self-pay

## 2022-12-30 NOTE — Telephone Encounter (Signed)
I recommend she increased the Dilaudid to 2 pills each time until I see her Does she have enough pain medicine?

## 2022-12-30 NOTE — Telephone Encounter (Signed)
Called back and given below message. She will increase the Dilaudid as instructed. She has enough until appt on Friday. She will need a refill at appt.

## 2022-12-30 NOTE — Telephone Encounter (Signed)
Her Mom called and left a message that [name] was having a lot of pain on Sunday.  Called [facility] to follow up. Her pain is not any better to her breast and she is taking Dilaudid Rx. She is doing the dressing changes to breast BID with metronidazole gel. She said that the breast looks worse and she is having a lot of drainage.  She is scheduled to see you on 4/5.

## 2022-12-31 ENCOUNTER — Other Ambulatory Visit: Payer: Self-pay

## 2022-12-31 ENCOUNTER — Other Ambulatory Visit (HOSPITAL_COMMUNITY): Payer: Self-pay

## 2023-01-02 ENCOUNTER — Inpatient Hospital Stay: Payer: BC Managed Care – PPO

## 2023-01-02 ENCOUNTER — Other Ambulatory Visit: Payer: Self-pay

## 2023-01-02 ENCOUNTER — Inpatient Hospital Stay: Payer: BC Managed Care – PPO | Attending: Hematology and Oncology | Admitting: Hematology and Oncology

## 2023-01-02 ENCOUNTER — Other Ambulatory Visit (HOSPITAL_COMMUNITY): Payer: Self-pay

## 2023-01-02 VITALS — BP 165/86 | HR 116 | Resp 18 | Ht 66.0 in | Wt 139.8 lb

## 2023-01-02 DIAGNOSIS — Z170 Estrogen receptor positive status [ER+]: Secondary | ICD-10-CM | POA: Insufficient documentation

## 2023-01-02 DIAGNOSIS — Z79899 Other long term (current) drug therapy: Secondary | ICD-10-CM | POA: Diagnosis not present

## 2023-01-02 DIAGNOSIS — D61818 Other pancytopenia: Secondary | ICD-10-CM | POA: Insufficient documentation

## 2023-01-02 DIAGNOSIS — C7951 Secondary malignant neoplasm of bone: Secondary | ICD-10-CM

## 2023-01-02 DIAGNOSIS — G893 Neoplasm related pain (acute) (chronic): Secondary | ICD-10-CM | POA: Diagnosis not present

## 2023-01-02 DIAGNOSIS — Z9011 Acquired absence of right breast and nipple: Secondary | ICD-10-CM | POA: Insufficient documentation

## 2023-01-02 DIAGNOSIS — C50911 Malignant neoplasm of unspecified site of right female breast: Secondary | ICD-10-CM | POA: Diagnosis not present

## 2023-01-02 DIAGNOSIS — D509 Iron deficiency anemia, unspecified: Secondary | ICD-10-CM

## 2023-01-02 LAB — CBC WITH DIFFERENTIAL/PLATELET
Abs Immature Granulocytes: 0.01 10*3/uL (ref 0.00–0.07)
Basophils Absolute: 0.1 10*3/uL (ref 0.0–0.1)
Basophils Relative: 2 %
Eosinophils Absolute: 0.1 10*3/uL (ref 0.0–0.5)
Eosinophils Relative: 2 %
HCT: 36 % (ref 36.0–46.0)
Hemoglobin: 11.4 g/dL — ABNORMAL LOW (ref 12.0–15.0)
Immature Granulocytes: 0 %
Lymphocytes Relative: 37 %
Lymphs Abs: 1.4 10*3/uL (ref 0.7–4.0)
MCH: 26.3 pg (ref 26.0–34.0)
MCHC: 31.7 g/dL (ref 30.0–36.0)
MCV: 83.1 fL (ref 80.0–100.0)
Monocytes Absolute: 0.2 10*3/uL (ref 0.1–1.0)
Monocytes Relative: 6 %
Neutro Abs: 2.1 10*3/uL (ref 1.7–7.7)
Neutrophils Relative %: 53 %
Platelets: 317 10*3/uL (ref 150–400)
RBC: 4.33 MIL/uL (ref 3.87–5.11)
RDW: 18.6 % — ABNORMAL HIGH (ref 11.5–15.5)
WBC: 3.8 10*3/uL — ABNORMAL LOW (ref 4.0–10.5)
nRBC: 0 % (ref 0.0–0.2)

## 2023-01-02 LAB — COMPREHENSIVE METABOLIC PANEL
ALT: 13 U/L (ref 0–44)
AST: 20 U/L (ref 15–41)
Albumin: 3.9 g/dL (ref 3.5–5.0)
Alkaline Phosphatase: 50 U/L (ref 38–126)
Anion gap: 6 (ref 5–15)
BUN: 13 mg/dL (ref 8–23)
CO2: 29 mmol/L (ref 22–32)
Calcium: 9.6 mg/dL (ref 8.9–10.3)
Chloride: 107 mmol/L (ref 98–111)
Creatinine, Ser: 0.97 mg/dL (ref 0.44–1.00)
GFR, Estimated: >60 mL/min (ref >60)
Glucose, Bld: 107 mg/dL — ABNORMAL HIGH (ref 70–99)
Potassium: 3.9 mmol/L (ref 3.5–5.1)
Sodium: 142 mmol/L (ref 135–145)
Total Bilirubin: 0.5 mg/dL (ref 0.3–1.2)
Total Protein: 7.3 g/dL (ref 6.5–8.1)

## 2023-01-02 LAB — SAMPLE TO BLOOD BANK

## 2023-01-02 MED ORDER — HYDROMORPHONE HCL 8 MG PO TABS
8.0000 mg | ORAL_TABLET | Freq: Four times a day (QID) | ORAL | 0 refills | Status: DC | PRN
  Filled 2023-01-02: qty 90, 23d supply, fill #0

## 2023-01-04 ENCOUNTER — Encounter: Payer: Self-pay | Admitting: Hematology and Oncology

## 2023-01-04 NOTE — Assessment & Plan Note (Signed)
Her exam is worse The skin lesions are now extending towards her back I suspect she is progressing and Arimidex and  Abemaciclib I recommend repeating imaging and switching treatment I recommend substituting Arimidex with Faslodex and switch her on another CDK4/6 inhibitor I am concerned about her poor tolerability with everolimus She has poor venous access (port was contraindicated due to bilateral chest wall involvement and family was not able to flush PICC line); so I want to avoid infusion chemo I will see her in 2 weeks to review results and discuss options

## 2023-01-04 NOTE — Progress Notes (Signed)
[facility] OFFICE PROGRESS NOTE  Patient Care Team: Artis Delay, MD as PCP - General (Hematology and Oncology)  ASSESSMENT & PLAN:  Breast cancer, stage 4, right (HCC) Her exam is worse The skin lesions are now extending towards her back I suspect she is progressing and Arimidex and  Abemaciclib I recommend repeating imaging and switching treatment I recommend substituting Arimidex with Faslodex and switch her on another CDK4/6 inhibitor I am concerned about her poor tolerability with everolimus She has poor venous access (port was contraindicated due to bilateral chest wall involvement and family was not able to flush PICC line); so I want to avoid infusion chemo I will see her in 2 weeks to review results and discuss options    Cancer associated pain Her pain is poorly controlled I plan to increase her pain medicine  Breast cancer metastasized to bone, right (HCC) She is reminded to take vitamin D and calcium She has obtained dental clearance She will continue Xgeva every 3 months, next due in her next visit  Orders Placed This Encounter  Procedures   CT CHEST ABDOMEN PELVIS W CONTRAST    Standing Status:   Future    Standing Expiration Date:   01/02/2024    Order Specific Question:   Preferred imaging location?    Answer:   [facility]    Order Specific Question:   Radiology Contrast Protocol - do NOT remove file path    Answer:   [email or web address]    All questions were answered. The patient knows to call the clinic with any problems, questions or concerns. The total time spent in the appointment was 40 minutes encounter with patients including review of chart and various tests results, discussions about plan of care and coordination of care plan   Artis Delay, MD 01/04/2023 1:27 PM  INTERVAL HISTORY: Please see below for problem oriented charting. she returns for treatment follow-up with her mother She felt  her pain control is worse and skin lesions are worse She denies missing her medications  REVIEW OF SYSTEMS:   Constitutional: Denies fevers, chills or abnormal weight loss Eyes: Denies blurriness of vision Ears, nose, mouth, throat, and face: Denies mucositis or sore throat Respiratory: Denies cough, dyspnea or wheezes Cardiovascular: Denies palpitation, chest discomfort or lower extremity swelling Gastrointestinal:  Denies nausea, heartburn or change in bowel habits Lymphatics: Denies new lymphadenopathy or easy bruising Neurological:Denies numbness, tingling or new weaknesses Behavioral/Psych: Mood is stable, no new changes  All other systems were reviewed with the patient and are negative.  I have reviewed the past medical history, past surgical history, social history and family history with the patient and they are unchanged from previous note.  ALLERGIES:  has No Known Allergies.  MEDICATIONS:  Current Outpatient Medications  Medication Sig Dispense Refill   calcium carbonate (TUMS - DOSED IN MG ELEMENTAL CALCIUM) 500 MG chewable tablet Chew 1 tablet by mouth 2 (two) times daily.     cholecalciferol (VITAMIN D3) 25 MCG (1000 UNIT) tablet Take 2,000 Units by mouth daily.     famotidine (PEPCID) 20 MG tablet Take 1 tablet (20 mg total) by mouth 2 (two) times daily. 60 tablet 2   HYDROmorphone (DILAUDID) 8 MG tablet Take 1 tablet (8 mg total) by mouth every 6 (six) hours as needed for moderate pain. 90 tablet 0   magnesium oxide (MAG-OX) 400 (240 Mg) MG tablet Take 1 tablet (400 mg total) by mouth daily. 30 tablet 3   metoprolol tartrate (  LOPRESSOR) 25 MG tablet Take 1 tablet (25 mg total) by mouth 2 (two) times daily. 60 tablet 0   metroNIDAZOLE (METROGEL) 1 % gel Apply topically in the morning and at bedtime. Apply topical 2 x day for 2 weeks 60 g 0   mirtazapine (REMERON) 7.5 MG tablet Take 1 tablet (7.5 mg total) by mouth at bedtime. 30 tablet 1   polyethylene glycol (MIRALAX /  GLYCOLAX) 17 g packet Take 17 g by mouth daily.     senna (SENOKOT) 8.6 MG tablet Take 2 tablets by mouth 2 (two) times daily.     No current facility-administered medications for this visit.    SUMMARY OF ONCOLOGIC HISTORY: Oncology History Overview Note  The tumor cells are EQUIVOCAL For Her2 (2+).   Estrogen Receptor:       POSITIVE, 100%, STRONG STAINING  Progesterone Receptor:   NEGATIVE (0)  Proliferation Marker Ki-67:   45%    Breast cancer, stage 4, right  03/22/2022 Initial Diagnosis   Primary breast malignancy (HCC)   03/24/2022 Pathology Results   SURGICAL PATHOLOGY  CASE: [phone]  PATIENT: [name]  Surgical Pathology Report   Clinical History: Ulcerative right breast lesion and probable metastatic breast cancer ([facility])   FINAL MICROSCOPIC DIAGNOSIS:   A. SOFT TISSUE MASS, RIGHT UPPER CHEST WALL, NEEDLE CORE BIOPSY:  - Invasive ductal carcinoma, see comment  - Ductal carcinoma in situ: Not identified   - Tubule formation: Score 3  - Nuclear pleomorphism: Score 3  - Mitotic count: Score 3  - Total score: 9  - Overall Grade: 3  - Lymphovascular invasion: Not identified  - Cancer Length: 13 mm  - Calcifications: Not identified  - Other findings: None    03/28/2022 Cancer Staging   Staging form: Breast, AJCC 8th Edition - Clinical stage from 03/28/2022: Stage IV (cT4d, cN3c, pM1, G3, ER+, PR-, HER2: Equivocal) - Signed by Artis Delay, MD on 03/28/2022 Stage prefix: Initial diagnosis Nuclear grade: G3 Histologic grading system: 3 grade system   04/17/2022 -  Chemotherapy   She started taking combination of abemaciclib with anastrozole   06/27/2022 Imaging   1. Ulcerated mass of the right breast with extension of nodular enhancing soft tissue to the right chest wall slight extension into the contralateral left breast, markedly decreased in size when compared with prior exam. 2. Interval decreased size of left axillary lymph node. 3. Solid pulmonary  nodules are decreased in size when compared with prior exam. 4. Large right pleural effusion with complete collapse of the right lower lobe, pleural effusion is decreased in size when compared with prior exam. 5. Stable low-attenuation liver lesions which are likely simple cysts. 6. Lytic and sclerotic osseous lesions again seen, many of which demonstrate increased sclerosis, likely due to treatment response. 7. Pathologic compression fracture of T3 demonstrates increased height loss, although there is no evidence of significant retropulsion. 8. Large stool ball in the rectum, correlate for constipation.     08/02/2022 - 08/30/2022 Hospital Admission   She was admitted to the hospital due to malignant pleural effusion and was diagnosed with MSSA empyema   08/10/2022 Imaging   1. Large loculated right hydropneumothorax encompassing about 75% of right hemithoracic volume. Pigtail right pleural drainage catheter in place. 2. Moderate to large left pleural effusion encompassing about 1/3 of the left hemithoracic volume. 3. Right axillary mass or lymph node measures 1.8 cm in short axis, formerly 1.9 cm. Other previously seen chest wall soft tissue lesions are less  conspicuous on today's exam due to lack of IV contrast, but likely still present. 4. Bony destructive findings of the right fifth rib laterally compatible with metastatic disease. 5. Extensive sclerosis in the sternum favoring metastatic disease over infection. 6. Nearly complete central collapse of the T3 vertebral body with associated sclerosis and minimal posterior bony retropulsion, unchanged from prior. 7. Diffuse hazy edema/infiltration of the mediastinal and subcutaneous adipose tissues compatible with third spacing of fluid. 8. Aortic atherosclerosis.       PHYSICAL EXAMINATION: ECOG PERFORMANCE STATUS: 2 - Symptomatic, <50% confined to bed  Vitals:   01/02/23 1315  BP: (!) 165/86  Pulse: (!) 116  Resp: 18  SpO2: 100%    Filed Weights   01/02/23 1315  Weight: 139 lb 12.8 oz (63.4 kg)    GENERAL:alert, no distress and comfortable SKIN: her skin lesions are worse and her chest wall is not healing EYES: normal, Conjunctiva are pink and non-injected, sclera clear OROPHARYNX:no exudate, no erythema and lips, buccal mucosa, and tongue normal  NECK: supple, thyroid normal size, non-tender, without nodularity LYMPH:  no palpable lymphadenopathy in the cervical, axillary or inguinal LUNGS: clear to auscultation and percussion with normal breathing effort HEART: regular rate & rhythm and no murmurs and no lower extremity edema ABDOMEN:abdomen soft, non-tender and normal bowel sounds Musculoskeletal:no cyanosis of digits and no clubbing  NEURO: alert & oriented x 3 with fluent speech, no focal motor/sensory deficits  LABORATORY DATA:  I have reviewed the data as listed    Component Value Date/Time   NA 142 01/02/2023 1240   K 3.9 01/02/2023 1240   CL 107 01/02/2023 1240   CO2 29 01/02/2023 1240   GLUCOSE 107 (H) 01/02/2023 1240   BUN 13 01/02/2023 1240   CREATININE 0.97 01/02/2023 1240   CREATININE 1.03 (H) 11/28/2022 1242   CALCIUM 9.6 01/02/2023 1240   PROT 7.3 01/02/2023 1240   ALBUMIN 3.9 01/02/2023 1240   AST 20 01/02/2023 1240   AST 22 11/28/2022 1242   ALT 13 01/02/2023 1240   ALT 13 11/28/2022 1242   ALKPHOS 50 01/02/2023 1240   BILITOT 0.5 01/02/2023 1240   BILITOT 0.5 11/28/2022 1242   GFRNONAA >60 01/02/2023 1240   GFRNONAA >60 11/28/2022 1242    No results found for: "SPEP", "UPEP"  Lab Results  Component Value Date   WBC 3.8 (L) 01/02/2023   NEUTROABS 2.1 01/02/2023   HGB 11.4 (L) 01/02/2023   HCT 36.0 01/02/2023   MCV 83.1 01/02/2023   PLT 317 01/02/2023      Chemistry      Component Value Date/Time   NA 142 01/02/2023 1240   K 3.9 01/02/2023 1240   CL 107 01/02/2023 1240   CO2 29 01/02/2023 1240   BUN 13 01/02/2023 1240   CREATININE 0.97 01/02/2023 1240    CREATININE 1.03 (H) 11/28/2022 1242      Component Value Date/Time   CALCIUM 9.6 01/02/2023 1240   ALKPHOS 50 01/02/2023 1240   AST 20 01/02/2023 1240   AST 22 11/28/2022 1242   ALT 13 01/02/2023 1240   ALT 13 11/28/2022 1242   BILITOT 0.5 01/02/2023 1240   BILITOT 0.5 11/28/2022 1242

## 2023-01-04 NOTE — Assessment & Plan Note (Signed)
She is reminded to take vitamin D and calcium She has obtained dental clearance She will continue Xgeva every 3 months, next due in her next visit

## 2023-01-04 NOTE — Assessment & Plan Note (Signed)
Her pain is poorly controlled I plan to increase her pain medicine

## 2023-01-05 ENCOUNTER — Other Ambulatory Visit: Payer: Self-pay | Admitting: Hematology and Oncology

## 2023-01-05 ENCOUNTER — Telehealth: Payer: Self-pay | Admitting: Pharmacy Technician

## 2023-01-05 ENCOUNTER — Other Ambulatory Visit: Payer: Self-pay

## 2023-01-05 ENCOUNTER — Telehealth: Payer: Self-pay

## 2023-01-05 ENCOUNTER — Other Ambulatory Visit (HOSPITAL_COMMUNITY): Payer: Self-pay

## 2023-01-05 MED ORDER — PALBOCICLIB 100 MG PO TABS
100.0000 mg | ORAL_TABLET | Freq: Every day | ORAL | 11 refills | Status: DC
  Filled 2023-01-05: qty 21, 21d supply, fill #0
  Filled 2023-01-08: qty 21, 28d supply, fill #0
  Filled 2023-01-29: qty 21, 28d supply, fill #1
  Filled 2023-02-26: qty 21, 28d supply, fill #2

## 2023-01-05 NOTE — Telephone Encounter (Signed)
Oral Oncology Pharmacist Encounter  Received new prescription for palbociclib for the treatment of metastatic ER positive, PR negative, HER2 negative breast cancer, in combination with fulvestrant, with planned duration until disease progression or unacceptable toxicity occurs.  Labs from 01/02/23 assessed, BMP and CBC indicate appropriate labs to initiate treatment. Prescription dose and frequency assessed for appropriateness. Appropriate to start at lower dose due to worsening skin lesions from prior therapy.   Current medication list in Epic reviewed, no DDIs were identified.   Evaluated chart and no patient barriers to medication adherence noted.   Patient agreement for treatment documented in MD note on 01/02/23.  Prescription has been e-scribed to the [facility] for benefits analysis and approval.  Oral Oncology Clinic will continue to follow for insurance authorization, copayment issues, initial counseling and start date.  Lennie Muckle, PharmD Candidate 01/05/2023 9:34 AM Oral Oncology Clinic [phone]

## 2023-01-05 NOTE — Telephone Encounter (Addendum)
Oral Oncology Patient Advocate Encounter  Prior Authorization for Ilda Foil has been approved.    PA# [id number] Effective dates: 01/05/23 through 01/05/24  Patients co-pay is $0.    Jinger Neighbors, CPhT-Adv Oncology Pharmacy Patient Advocate [facility] Direct Number: [phone]  Fax: [phone]

## 2023-01-05 NOTE — Telephone Encounter (Signed)
Oral Oncology Patient Advocate Encounter   Received notification that prior authorization for Ilda Foil is required.   PA submitted on 01/05/23 Key [id number] Status is pending     Jinger Neighbors, CPhT-Adv Oncology Pharmacy Patient Advocate [facility] Direct Number: [phone]  Fax: [phone]

## 2023-01-08 ENCOUNTER — Other Ambulatory Visit (HOSPITAL_COMMUNITY): Payer: Self-pay

## 2023-01-08 NOTE — Telephone Encounter (Signed)
Oral Chemotherapy Pharmacist Encounter   I spoke with patient for overview of: Ibrance for the treatment of metastatic, hormone-receptor positive, HER2 receptor negative breast cancer, in combination with fulvestrant, planned duration until disease progression or unacceptable toxicity.   Counseled patient on administration, dosing, side effects, monitoring, drug-food interactions, safe handling, storage, and disposal.  Patient will take Ibrance 100 mg tablets, 1 tablet by mouth once daily, with or without food, taken for 3 weeks on, 1 week off, and repeated.  Patient knows to avoid grapefruit and grapefruit juice while on treatment with Ibrance.  Ibrance start date: 01/14/23  Adverse effects include but are not limited to: fatigue, hair loss, GI upset, nausea, decreased blood counts, and increased upper respiratory infections. Severe, life-threatening, and/or fatal interstitial lung disease (ILD) and/or pneumonitis may occur with CDK 4/6 inhibitors.  Patient will obtain anti diarrheal and alert the office of 4 or more loose stools above baseline.  Patient reminded of WBC check on Cycle 1 Day 14 for dose and ANC assessment.  Reviewed with patient importance of keeping a medication schedule and plan for any missed doses. No barriers to medication adherence identified.  Insurance authorization for Ilda Foil has been obtained. Test claim at the pharmacy revealed copayment $0. This will ship from the [facility] outpatient pharmacy on 01/09/23 to deliver to patient's home on 01/12/23.  Patient informed the pharmacy will reach out 5-7 days prior to needing next fill of Ibrance to coordinate continued medication acquisition to prevent break in therapy.  All questions answered.  Patient voiced understanding and appreciation.   Medication education handout placed in mail for patient. Patient knows to call the office with questions or concerns. Oral Chemotherapy Clinic phone number provided to  patient.   Lennie Muckle, PharmD Candidate 01/08/2023   2:05 PM Oral Oncology Clinic [phone]

## 2023-01-09 ENCOUNTER — Ambulatory Visit (HOSPITAL_COMMUNITY)
Admission: RE | Admit: 2023-01-09 | Discharge: 2023-01-09 | Disposition: A | Discharge status: Home / Self Care | Payer: BC Managed Care – PPO | Source: Ambulatory Visit | Attending: Hematology and Oncology | Admitting: Hematology and Oncology

## 2023-01-09 ENCOUNTER — Encounter (HOSPITAL_COMMUNITY): Payer: Self-pay

## 2023-01-09 DIAGNOSIS — C50911 Malignant neoplasm of unspecified site of right female breast: Secondary | ICD-10-CM | POA: Diagnosis present

## 2023-01-09 MED ORDER — SODIUM CHLORIDE (PF) 0.9 % IJ SOLN
INTRAMUSCULAR | Status: AC
  Filled 2023-01-09: qty 50

## 2023-01-09 MED ORDER — IOHEXOL 300 MG/ML  SOLN
100.0000 mL | Freq: Once | INTRAMUSCULAR | Status: AC | PRN
  Administered 2023-01-09: 100 mL via INTRAVENOUS

## 2023-01-13 ENCOUNTER — Inpatient Hospital Stay (HOSPITAL_BASED_OUTPATIENT_CLINIC_OR_DEPARTMENT_OTHER): Payer: BC Managed Care – PPO | Admitting: Hematology and Oncology

## 2023-01-13 ENCOUNTER — Other Ambulatory Visit: Payer: Self-pay

## 2023-01-13 ENCOUNTER — Other Ambulatory Visit (HOSPITAL_COMMUNITY): Payer: Self-pay

## 2023-01-13 ENCOUNTER — Encounter: Payer: Self-pay | Admitting: Hematology and Oncology

## 2023-01-13 ENCOUNTER — Inpatient Hospital Stay: Payer: BC Managed Care – PPO

## 2023-01-13 VITALS — BP 141/94 | HR 128 | Resp 18 | Ht 66.0 in

## 2023-01-13 DIAGNOSIS — C50911 Malignant neoplasm of unspecified site of right female breast: Secondary | ICD-10-CM

## 2023-01-13 DIAGNOSIS — G893 Neoplasm related pain (acute) (chronic): Secondary | ICD-10-CM | POA: Diagnosis not present

## 2023-01-13 DIAGNOSIS — C7951 Secondary malignant neoplasm of bone: Secondary | ICD-10-CM

## 2023-01-13 MED ORDER — FULVESTRANT 250 MG/5ML IM SOSY
500.0000 mg | PREFILLED_SYRINGE | Freq: Once | INTRAMUSCULAR | Status: AC
  Administered 2023-01-13: 500 mg via INTRAMUSCULAR
  Filled 2023-01-13: qty 10

## 2023-01-13 MED ORDER — DENOSUMAB 120 MG/1.7ML ~~LOC~~ SOLN
120.0000 mg | Freq: Once | SUBCUTANEOUS | Status: AC
  Administered 2023-01-13: 120 mg via SUBCUTANEOUS
  Filled 2023-01-13: qty 1.7

## 2023-01-13 NOTE — Assessment & Plan Note (Signed)
I have reviewed multiple CT imaging with the patient and her mother She has signs of disease progression on her chest wall Her pleural effusion is minimal and she is not symptomatic We discussed the risk, benefits, side effects of treatment with Ibrance and fulvestrant and she is in agreement to proceed I will see her next week for toxicity review She is due for Xgeva and she will get her Rivka Barbara today She will continue calcium and vitamin D supplement I recommend minimum 3 months of treatment before repeating imaging study

## 2023-01-13 NOTE — Progress Notes (Signed)
[facility] OFFICE PROGRESS NOTE  Patient Care Team: Artis Delay, MD as PCP - General (Hematology and Oncology)  ASSESSMENT & PLAN:  Breast cancer, stage 4, right (HCC) I have reviewed multiple CT imaging with the patient and her mother She has signs of disease progression on her chest wall Her pleural effusion is minimal and she is not symptomatic We discussed the risk, benefits, side effects of treatment with Ibrance and fulvestrant and she is in agreement to proceed I will see her next week for toxicity review She is due for Xgeva and she will get her Rivka Barbara today She will continue calcium and vitamin D supplement I recommend minimum 3 months of treatment before repeating imaging study  Breast cancer metastasized to bone, right (HCC) She is reminded to take vitamin D and calcium She has obtained dental clearance She will continue Xgeva every 3 months, next due today  Cancer associated pain Her pain medicine dose was recently increased She is only taking pain medicine twice or 3 times a day Recommend the patient to take it more frequent as needed I will assess pain control next visit  No orders of the defined types were placed in this encounter.   All questions were answered. The patient knows to call the clinic with any problems, questions or concerns. The total time spent in the appointment was 40 minutes encounter with patients including review of chart and various tests results, discussions about plan of care and coordination of care plan   Artis Delay, MD 01/13/2023 3:57 PM  INTERVAL HISTORY: Please see below for problem oriented charting. she returns for treatment follow-up and review test results She continues to complain of severe pain She denies recent constipation No recent nausea vomiting  REVIEW OF SYSTEMS:   Constitutional: Denies fevers, chills or abnormal weight loss Eyes: Denies blurriness of vision Ears, nose, mouth, throat, and face:  Denies mucositis or sore throat Respiratory: Denies cough, dyspnea or wheezes Cardiovascular: Denies palpitation, chest discomfort or lower extremity swelling Gastrointestinal:  Denies nausea, heartburn or change in bowel habits Skin: Denies abnormal skin rashes Lymphatics: Denies new lymphadenopathy or easy bruising Neurological:Denies numbness, tingling or new weaknesses Behavioral/Psych: Mood is stable, no new changes  All other systems were reviewed with the patient and are negative.  I have reviewed the past medical history, past surgical history, social history and family history with the patient and they are unchanged from previous note.  ALLERGIES:  has No Known Allergies.  MEDICATIONS:  Current Outpatient Medications  Medication Sig Dispense Refill   palbociclib (IBRANCE) 100 MG tablet Take 1 tablet (100 mg total) by mouth daily. Take for 21 days on, 7 days off, repeat every 28 days. 21 tablet 11   calcium carbonate (TUMS - DOSED IN MG ELEMENTAL CALCIUM) 500 MG chewable tablet Chew 1 tablet by mouth 2 (two) times daily.     cholecalciferol (VITAMIN D3) 25 MCG (1000 UNIT) tablet Take 2,000 Units by mouth daily.     famotidine (PEPCID) 20 MG tablet Take 1 tablet (20 mg total) by mouth 2 (two) times daily. 60 tablet 2   HYDROmorphone (DILAUDID) 8 MG tablet Take 1 tablet (8 mg total) by mouth every 6 (six) hours as needed for moderate pain. 90 tablet 0   magnesium oxide (MAG-OX) 400 (240 Mg) MG tablet Take 1 tablet (400 mg total) by mouth daily. 30 tablet 3   metoprolol tartrate (LOPRESSOR) 25 MG tablet Take 1 tablet (25 mg total) by mouth 2 (  two) times daily. 60 tablet 0   metroNIDAZOLE (METROGEL) 1 % gel Apply topically in the morning and at bedtime. Apply topical 2 x day for 2 weeks 60 g 0   mirtazapine (REMERON) 7.5 MG tablet Take 1 tablet (7.5 mg total) by mouth at bedtime. 30 tablet 1   polyethylene glycol (MIRALAX / GLYCOLAX) 17 g packet Take 17 g by mouth daily.     senna  (SENOKOT) 8.6 MG tablet Take 2 tablets by mouth 2 (two) times daily.     No current facility-administered medications for this visit.    SUMMARY OF ONCOLOGIC HISTORY: Oncology History Overview Note  The tumor cells are EQUIVOCAL For Her2 (2+).   Estrogen Receptor:       POSITIVE, 100%, STRONG STAINING  Progesterone Receptor:   NEGATIVE (0)  Proliferation Marker Ki-67:   45%    Breast cancer, stage 4, right  03/22/2022 Initial Diagnosis   Primary breast malignancy (HCC)   03/24/2022 Pathology Results   SURGICAL PATHOLOGY  CASE: [id number]  PATIENT: [name]  Surgical Pathology Report   Clinical History: Ulcerative right breast lesion and probable metastatic breast cancer ([facility])   FINAL MICROSCOPIC DIAGNOSIS:   A. SOFT TISSUE MASS, RIGHT UPPER CHEST WALL, NEEDLE CORE BIOPSY:  - Invasive ductal carcinoma, see comment  - Ductal carcinoma in situ: Not identified   - Tubule formation: Score 3  - Nuclear pleomorphism: Score 3  - Mitotic count: Score 3  - Total score: 9  - Overall Grade: 3  - Lymphovascular invasion: Not identified  - Cancer Length: 13 mm  - Calcifications: Not identified  - Other findings: None    03/28/2022 Cancer Staging   Staging form: Breast, AJCC 8th Edition - Clinical stage from 03/28/2022: Stage IV (cT4d, cN3c, pM1, G3, ER+, PR-, HER2: Equivocal) - Signed by Artis Delay, MD on 03/28/2022 Stage prefix: Initial diagnosis Nuclear grade: G3 Histologic grading system: 3 grade system   04/17/2022 -  Chemotherapy   She started taking combination of abemaciclib with anastrozole   06/27/2022 Imaging   1. Ulcerated mass of the right breast with extension of nodular enhancing soft tissue to the right chest wall slight extension into the contralateral left breast, markedly decreased in size when compared with prior exam. 2. Interval decreased size of left axillary lymph node. 3. Solid pulmonary nodules are decreased in size when compared with prior  exam. 4. Large right pleural effusion with complete collapse of the right lower lobe, pleural effusion is decreased in size when compared with prior exam. 5. Stable low-attenuation liver lesions which are likely simple cysts. 6. Lytic and sclerotic osseous lesions again seen, many of which demonstrate increased sclerosis, likely due to treatment response. 7. Pathologic compression fracture of T3 demonstrates increased height loss, although there is no evidence of significant retropulsion. 8. Large stool ball in the rectum, correlate for constipation.     08/02/2022 - 08/30/2022 Hospital Admission   She was admitted to the hospital due to malignant pleural effusion and was diagnosed with MSSA empyema   08/10/2022 Imaging   1. Large loculated right hydropneumothorax encompassing about 75% of right hemithoracic volume. Pigtail right pleural drainage catheter in place. 2. Moderate to large left pleural effusion encompassing about 1/3 of the left hemithoracic volume. 3. Right axillary mass or lymph node measures 1.8 cm in short axis, formerly 1.9 cm. Other previously seen chest wall soft tissue lesions are less conspicuous on today's exam due to lack of IV contrast, but likely still  present. 4. Bony destructive findings of the right fifth rib laterally compatible with metastatic disease. 5. Extensive sclerosis in the sternum favoring metastatic disease over infection. 6. Nearly complete central collapse of the T3 vertebral body with associated sclerosis and minimal posterior bony retropulsion, unchanged from prior. 7. Diffuse hazy edema/infiltration of the mediastinal and subcutaneous adipose tissues compatible with third spacing of fluid. 8. Aortic atherosclerosis.     01/13/2023 Imaging   CT CHEST ABDOMEN PELVIS W CONTRAST  Result Date: 01/12/2023 CLINICAL DATA:  Stage IV invasive breast cancer. Ongoing chemotherapy. Assess treatment response. * Tracking Code: BO * EXAM: CT CHEST, ABDOMEN, AND  PELVIS WITH CONTRAST TECHNIQUE: Multidetector CT imaging of the chest, abdomen and pelvis was performed following the standard protocol during bolus administration of intravenous contrast. RADIATION DOSE REDUCTION: This exam was performed according to the departmental dose-optimization program which includes automated exposure control, adjustment of the mA and/or kV according to patient size and/or use of iterative reconstruction technique. CONTRAST:  OMNIPAQUE IOHEXOL 300 MG/ML  SOLN COMPARISON:  CT chest 08/10/2022. Chest abdomen pelvis CT 06/25/2022. Numerous prior x-rays FINDINGS: CT CHEST FINDINGS Cardiovascular: Right side of the heart is slightly enlarged. No pericardial effusion the thoracic aorta has a normal course and caliber. Mild vascular calcifications. Mediastinum/Nodes: Slightly patulous thoracic esophagus. Preserved thyroid gland. No abnormal lymph node enlargement seen in the left axillary region, left chest wall, left axillary region or mediastinum small right hilar node on image 33 of series three, nonpathologic by size criteria slightly more prominent than usually seen. This was not as well seen on the prior CT scan of November 2023 but the study was without contrast. On the older study with contrast from 06/25/2022 this node was present and stable. Enlarged right axillary lymph nodes identified. On the study of November 2023 a node was measured at 18 by 28 mm and today this same node measures 2.7 x 1.5 cm on series 3, image 29. Lungs/Pleura: Left basilar atelectasis. No pneumothorax or effusion. No dominant left-sided lung nodule. There is some volume loss along the right hemithorax with a small effusion and pleural thickening. Small loculated pleural effusion at the right lateral lung base as well such as series 3, image 47 measuring 7.2 by 2.6 cm. Adjacent lung opacity. There is a nodule in the right lower lobe which is noncalcified on series 7, image 74 measuring 5 mm. This area was  obscured by a more significant opacity on the prior CT scans of the larger effusion and pleural drain. Attention on follow-up. Is also a spiculated nodule right upper lobe today on series 7, image 32 measuring 7 by 4 mm. In retrospect this nodule was present in November 2023 measuring 6 3 mm. In September 2023 this measured 4 mm. Slowly increasing in size. Musculoskeletal: Curvature of the spine with some degenerative changes. There is areas of sclerosis seen along several vertebral bodies, the sternum and ribs with some lytic areas consistent with bone metastases. There is severe compression of the T3 level which is unchanged from previous. The areas of sclerosis involving T4 level in the sternum appears similar. There is diffuse anasarca with several soft tissue nodules identified in the right chest wall including dermal, subdermal along muscular. Many these areas are significantly increased. Example soft tissue nodule anteriorly on series 3, image 34 extending out to the skin measures 19 by 19 mm today and previously small amount of thickening would have measured up to 5 mm in thickness. Again several other areas  are significantly increased. Previous right mastectomy. CT ABDOMEN PELVIS FINDINGS Hepatobiliary: Multiple hepatic cysts are again identified. Gallbladder is present. Patent portal vein. Pancreas: Unremarkable. No pancreatic ductal dilatation or surrounding inflammatory changes. Spleen: Normal in size without focal abnormality. Adrenals/Urinary Tract: The adrenal glands are preserved. Upper pole left-sided Bosniak 1 renal cyst again seen measuring 2.7 cm in diameter. Hounsfield unit of 5. No specific imaging follow-up. Tiny focus elsewhere as well in the mid left kidney. No imaging follow-up. These are unchanged from previous. Preserved contours of the collapsed urinary bladder. Stomach/Bowel: Bowel is nondilated. Diffuse colonic stool. Cecum resides in the right hemipelvis. Presumed normal retrocecal  air-filled appendix. Vascular/Lymphatic: Normal caliber aorta and IVC. No discrete abnormal lymph node enlargement identified in the abdomen and pelvis. Reproductive: Uterus and bilateral adnexa are unremarkable. Other: Anasarca.  No free air or free fluid identified. Musculoskeletal: Curvature of the spine. Multifocal degenerative changes of the spine and pelvis there is also lytic and sclerotic bone lesions identified along the pelvis and lumbar spine. Distribution by CT appears similar to the previous. Schmorl's node compression deformity again seen at L4. IMPRESSION: Worsening disease with multiple areas of nodular skin thickening along the right hemithorax and chest wall with additional underlying subcutaneous fat and muscular nodules. Persistent axillary lymph nodes. Small right pleural effusion seen with a loculated component on the right side of the lung base. Adjacent lung opacities. Small right-sided lung nodules identified which were not as well seen on the prior, possibly due to the level of the lung opacity but are felt to be increasing slightly. Attention on short follow-up. Mixed lucent and sclerotic bone lesions are again identified with stable compression of T3. Electronically Signed   By: Karen Kays M.D.   On: 01/12/2023 15:09        PHYSICAL EXAMINATION: ECOG PERFORMANCE STATUS: 1 - Symptomatic but completely ambulatory  Vitals:   01/13/23 1402  BP: (!) 141/94  Pulse: (!) 128  Resp: 18  SpO2: 99%   There were no vitals filed for this visit.  GENERAL:alert, no distress and comfortable NEURO: alert & oriented x 3 with fluent speech, no focal motor/sensory deficits  LABORATORY DATA:  I have reviewed the data as listed    Component Value Date/Time   NA 142 01/02/2023 1240   K 3.9 01/02/2023 1240   CL 107 01/02/2023 1240   CO2 29 01/02/2023 1240   GLUCOSE 107 (H) 01/02/2023 1240   BUN 13 01/02/2023 1240   CREATININE 0.97 01/02/2023 1240   CREATININE 1.03 (H) 11/28/2022  1242   CALCIUM 9.6 01/02/2023 1240   PROT 7.3 01/02/2023 1240   ALBUMIN 3.9 01/02/2023 1240   AST 20 01/02/2023 1240   AST 22 11/28/2022 1242   ALT 13 01/02/2023 1240   ALT 13 11/28/2022 1242   ALKPHOS 50 01/02/2023 1240   BILITOT 0.5 01/02/2023 1240   BILITOT 0.5 11/28/2022 1242   GFRNONAA >60 01/02/2023 1240   GFRNONAA >60 11/28/2022 1242    No results found for: "SPEP", "UPEP"  Lab Results  Component Value Date   WBC 3.8 (L) 01/02/2023   NEUTROABS 2.1 01/02/2023   HGB 11.4 (L) 01/02/2023   HCT 36.0 01/02/2023   MCV 83.1 01/02/2023   PLT 317 01/02/2023      Chemistry      Component Value Date/Time   NA 142 01/02/2023 1240   K 3.9 01/02/2023 1240   CL 107 01/02/2023 1240   CO2 29 01/02/2023 1240   BUN 13  01/02/2023 1240   CREATININE 0.97 01/02/2023 1240   CREATININE 1.03 (H) 11/28/2022 1242      Component Value Date/Time   CALCIUM 9.6 01/02/2023 1240   ALKPHOS 50 01/02/2023 1240   AST 20 01/02/2023 1240   AST 22 11/28/2022 1242   ALT 13 01/02/2023 1240   ALT 13 11/28/2022 1242   BILITOT 0.5 01/02/2023 1240   BILITOT 0.5 11/28/2022 1242       RADIOGRAPHIC STUDIES: I have reviewed multiple imaging studies with the patient and her mother I have personally reviewed the radiological images as listed and agreed with the findings in the report. CT CHEST ABDOMEN PELVIS W CONTRAST  Result Date: 01/12/2023 CLINICAL DATA:  Stage IV invasive breast cancer. Ongoing chemotherapy. Assess treatment response. * Tracking Code: BO * EXAM: CT CHEST, ABDOMEN, AND PELVIS WITH CONTRAST TECHNIQUE: Multidetector CT imaging of the chest, abdomen and pelvis was performed following the standard protocol during bolus administration of intravenous contrast. RADIATION DOSE REDUCTION: This exam was performed according to the departmental dose-optimization program which includes automated exposure control, adjustment of the mA and/or kV according to patient size and/or use of iterative  reconstruction technique. CONTRAST:  OMNIPAQUE IOHEXOL 300 MG/ML  SOLN COMPARISON:  CT chest 08/10/2022. Chest abdomen pelvis CT 06/25/2022. Numerous prior x-rays FINDINGS: CT CHEST FINDINGS Cardiovascular: Right side of the heart is slightly enlarged. No pericardial effusion the thoracic aorta has a normal course and caliber. Mild vascular calcifications. Mediastinum/Nodes: Slightly patulous thoracic esophagus. Preserved thyroid gland. No abnormal lymph node enlargement seen in the left axillary region, left chest wall, left axillary region or mediastinum small right hilar node on image 33 of series three, nonpathologic by size criteria slightly more prominent than usually seen. This was not as well seen on the prior CT scan of November 2023 but the study was without contrast. On the older study with contrast from 06/25/2022 this node was present and stable. Enlarged right axillary lymph nodes identified. On the study of November 2023 a node was measured at 18 by 28 mm and today this same node measures 2.7 x 1.5 cm on series 3, image 29. Lungs/Pleura: Left basilar atelectasis. No pneumothorax or effusion. No dominant left-sided lung nodule. There is some volume loss along the right hemithorax with a small effusion and pleural thickening. Small loculated pleural effusion at the right lateral lung base as well such as series 3, image 47 measuring 7.2 by 2.6 cm. Adjacent lung opacity. There is a nodule in the right lower lobe which is noncalcified on series 7, image 74 measuring 5 mm. This area was obscured by a more significant opacity on the prior CT scans of the larger effusion and pleural drain. Attention on follow-up. Is also a spiculated nodule right upper lobe today on series 7, image 32 measuring 7 by 4 mm. In retrospect this nodule was present in November 2023 measuring 6 3 mm. In September 2023 this measured 4 mm. Slowly increasing in size. Musculoskeletal: Curvature of the spine with some degenerative  changes. There is areas of sclerosis seen along several vertebral bodies, the sternum and ribs with some lytic areas consistent with bone metastases. There is severe compression of the T3 level which is unchanged from previous. The areas of sclerosis involving T4 level in the sternum appears similar. There is diffuse anasarca with several soft tissue nodules identified in the right chest wall including dermal, subdermal along muscular. Many these areas are significantly increased. Example soft tissue nodule anteriorly on series  3, image 34 extending out to the skin measures 19 by 19 mm today and previously small amount of thickening would have measured up to 5 mm in thickness. Again several other areas are significantly increased. Previous right mastectomy. CT ABDOMEN PELVIS FINDINGS Hepatobiliary: Multiple hepatic cysts are again identified. Gallbladder is present. Patent portal vein. Pancreas: Unremarkable. No pancreatic ductal dilatation or surrounding inflammatory changes. Spleen: Normal in size without focal abnormality. Adrenals/Urinary Tract: The adrenal glands are preserved. Upper pole left-sided Bosniak 1 renal cyst again seen measuring 2.7 cm in diameter. Hounsfield unit of 5. No specific imaging follow-up. Tiny focus elsewhere as well in the mid left kidney. No imaging follow-up. These are unchanged from previous. Preserved contours of the collapsed urinary bladder. Stomach/Bowel: Bowel is nondilated. Diffuse colonic stool. Cecum resides in the right hemipelvis. Presumed normal retrocecal air-filled appendix. Vascular/Lymphatic: Normal caliber aorta and IVC. No discrete abnormal lymph node enlargement identified in the abdomen and pelvis. Reproductive: Uterus and bilateral adnexa are unremarkable. Other: Anasarca.  No free air or free fluid identified. Musculoskeletal: Curvature of the spine. Multifocal degenerative changes of the spine and pelvis there is also lytic and sclerotic bone lesions identified  along the pelvis and lumbar spine. Distribution by CT appears similar to the previous. Schmorl's node compression deformity again seen at L4. IMPRESSION: Worsening disease with multiple areas of nodular skin thickening along the right hemithorax and chest wall with additional underlying subcutaneous fat and muscular nodules. Persistent axillary lymph nodes. Small right pleural effusion seen with a loculated component on the right side of the lung base. Adjacent lung opacities. Small right-sided lung nodules identified which were not as well seen on the prior, possibly due to the level of the lung opacity but are felt to be increasing slightly. Attention on short follow-up. Mixed lucent and sclerotic bone lesions are again identified with stable compression of T3. Electronically Signed   By: Karen Kays M.D.   On: 01/12/2023 15:09

## 2023-01-13 NOTE — Assessment & Plan Note (Signed)
She is reminded to take vitamin D and calcium She has obtained dental clearance She will continue Xgeva every 3 months, next due today

## 2023-01-13 NOTE — Assessment & Plan Note (Signed)
Her pain medicine dose was recently increased She is only taking pain medicine twice or 3 times a day Recommend the patient to take it more frequent as needed I will assess pain control next visit

## 2023-01-13 NOTE — Patient Instructions (Signed)
Fulvestrant Injection What is this medication? FULVESTRANT (ful VES trant) treats breast cancer. It works by blocking the hormone estrogen in breast tissue, which prevents breast cancer cells from spreading or growing. This medicine may be used for other purposes; ask your health care provider or pharmacist if you have questions. COMMON BRAND NAME(S): FASLODEX What should I tell my care team before I take this medication? They need to know if you have any of these conditions: Bleeding disorder Liver disease Low blood cell levels, such as low white cells, red cells, and platelets An unusual or allergic reaction to fulvestrant, other medications, foods, dyes, or preservatives Pregnant or trying to get pregnant Breast-feeding How should I use this medication? This medication is injected into a muscle. It is given by your care team in a hospital or clinic setting. Talk to your care team about the use of this medication in children. Special care may be needed. Overdosage: If you think you have taken too much of this medicine contact a poison control center or emergency room at once. NOTE: This medicine is only for you. Do not share this medicine with others. What if I miss a dose? Keep appointments for follow-up doses. It is important not to miss your dose. Call your care team if you are unable to keep an appointment. What may interact with this medication? Certain medications that prevent or treat blood clots, such as warfarin, enoxaparin, dalteparin, apixaban, dabigatran, rivaroxaban This list may not describe all possible interactions. Give your health care provider a list of all the medicines, herbs, non-prescription drugs, or dietary supplements you use. Also tell them if you smoke, drink alcohol, or use illegal drugs. Some items may interact with your medicine. What should I watch for while using this medication? Your condition will be monitored carefully while you are receiving this  medication. You may need blood work while taking this medication. Talk to your care team if you may be pregnant. Serious birth defects can occur if you take this medication during pregnancy and for 1 year after the last dose. You will need a negative pregnancy test before starting this medication. Contraception is recommended while taking this medication and for 1 year after the last dose. Your care team can help you find the option that works for you. Do not breastfeed while taking this medication and for 1 year after the last dose. This medication may cause infertility. Talk to your care team if you are concerned about your fertility. What side effects may I notice from receiving this medication? Side effects that you should report to your care team as soon as possible: Allergic reactions or angioedema--skin rash, itching or hives, swelling of the face, eyes, lips, tongue, arms, or legs, trouble swallowing or breathing Pain, tingling, or numbness in the hands or feet Side effects that usually do not require medical attention (report to your care team if they continue or are bothersome): Bone, joint, or muscle pain Constipation Headache Hot flashes Nausea Pain, redness, or irritation at injection site Unusual weakness or fatigue This list may not describe all possible side effects. Call your doctor for medical advice about side effects. You may report side effects to FDA at 1-800-FDA-1088. Where should I keep my medication? This medication is given in a hospital or clinic. It will not be stored at home. NOTE: This sheet is a summary. It may not cover all possible information. If you have questions about this medicine, talk to your doctor, pharmacist, or health care provider.  Denosumab Injection (Oncology) What is this medication? DENOSUMAB (den oh SUE mab) prevents weakened bones caused by cancer. It may also be used to treat noncancerous bone tumors that cannot be removed by surgery. It can  also be used to treat high calcium levels in the blood caused by cancer. It works by blocking a protein that causes bones to break down quickly. This slows down the release of calcium from bones, which lowers calcium levels in your blood. It also makes your bones stronger and less likely to break (fracture). This medicine may be used for other purposes; ask your health care provider or pharmacist if you have questions. COMMON BRAND NAME(S): XGEVA What should I tell my care team before I take this medication? They need to know if you have any of these conditions: Dental disease Having surgery or tooth extraction Infection Kidney disease Low levels of calcium or vitamin D in the blood Malnutrition On hemodialysis Skin conditions or sensitivity Thyroid or parathyroid disease An unusual reaction to denosumab, other medications, foods, dyes, or preservatives Pregnant or trying to get pregnant Breast-feeding How should I use this medication? This medication is for injection under the skin. It is given by your care team in a hospital or clinic setting. A special MedGuide will be given to you before each treatment. Be sure to read this information carefully each time. Talk to your care team about the use of this medication in children. While it may be prescribed for children as young as 13 years for selected conditions, precautions do apply. Overdosage: If you think you have taken too much of this medicine contact a poison control center or emergency room at once. NOTE: This medicine is only for you. Do not share this medicine with others. What if I miss a dose? Keep appointments for follow-up doses. It is important not to miss your dose. Call your care team if you are unable to keep an appointment. What may interact with this medication? Do not take this medication with any of the following: Other medications containing denosumab This medication may also interact with the following: Medications  that lower your chance of fighting infection Steroid medications, such as prednisone or cortisone This list may not describe all possible interactions. Give your health care provider a list of all the medicines, herbs, non-prescription drugs, or dietary supplements you use. Also tell them if you smoke, drink alcohol, or use illegal drugs. Some items may interact with your medicine. What should I watch for while using this medication? Your condition will be monitored carefully while you are receiving this medication. You may need blood work while taking this medication. This medication may increase your risk of getting an infection. Call your care team for advice if you get a fever, chills, sore throat, or other symptoms of a cold or flu. Do not treat yourself. Try to avoid being around people who are sick. You should make sure you get enough calcium and vitamin D while you are taking this medication, unless your care team tells you not to. Discuss the foods you eat and the vitamins you take with your care team. Some people who take this medication have severe bone, joint, or muscle pain. This medication may also increase your risk for jaw problems or a broken thigh bone. Tell your care team right away if you have severe pain in your jaw, bones, joints, or muscles. Tell your care team if you have any pain that does not go away or that gets worse.  Talk to your care team if you may be pregnant. Serious birth defects can occur if you take this medication during pregnancy and for 5 months after the last dose. You will need a negative pregnancy test before starting this medication. Contraception is recommended while taking this medication and for 5 months after the last dose. Your care team can help you find the option that works for you. What side effects may I notice from receiving this medication? Side effects that you should report to your care team as soon as possible: Allergic reactions--skin rash,  itching, hives, swelling of the face, lips, tongue, or throat Bone, joint, or muscle pain Low calcium level--muscle pain or cramps, confusion, tingling, or numbness in the hands or feet Osteonecrosis of the jaw--pain, swelling, or redness in the mouth, numbness of the jaw, poor healing after dental work, unusual discharge from the mouth, visible bones in the mouth Side effects that usually do not require medical attention (report to your care team if they continue or are bothersome): Cough Diarrhea Fatigue Headache Nausea This list may not describe all possible side effects. Call your doctor for medical advice about side effects. You may report side effects to FDA at 1-800-FDA-1088. Where should I keep my medication? This medication is given in a hospital or clinic. It will not be stored at home. NOTE: This sheet is a summary. It may not cover all possible information. If you have questions about this medicine, talk to your doctor, pharmacist, or health care provider.  2023 [employer] Standard (2022-02-03 00:00:00)   2023 [employer] Standard (2007-11-06 00:00:00)

## 2023-01-14 ENCOUNTER — Other Ambulatory Visit (HOSPITAL_COMMUNITY): Payer: Self-pay

## 2023-01-22 ENCOUNTER — Inpatient Hospital Stay: Payer: BC Managed Care – PPO | Admitting: Hematology and Oncology

## 2023-01-22 ENCOUNTER — Encounter: Payer: Self-pay | Admitting: Hematology and Oncology

## 2023-01-22 ENCOUNTER — Other Ambulatory Visit: Payer: Self-pay

## 2023-01-22 ENCOUNTER — Inpatient Hospital Stay: Payer: BC Managed Care – PPO

## 2023-01-22 VITALS — BP 160/90 | HR 98 | Resp 18 | Ht 66.0 in | Wt 137.0 lb

## 2023-01-22 DIAGNOSIS — G893 Neoplasm related pain (acute) (chronic): Secondary | ICD-10-CM | POA: Diagnosis not present

## 2023-01-22 DIAGNOSIS — C50911 Malignant neoplasm of unspecified site of right female breast: Secondary | ICD-10-CM

## 2023-01-22 DIAGNOSIS — R634 Abnormal weight loss: Secondary | ICD-10-CM

## 2023-01-22 DIAGNOSIS — D61818 Other pancytopenia: Secondary | ICD-10-CM | POA: Diagnosis not present

## 2023-01-22 DIAGNOSIS — T148XXD Other injury of unspecified body region, subsequent encounter: Secondary | ICD-10-CM | POA: Diagnosis not present

## 2023-01-22 LAB — COMPREHENSIVE METABOLIC PANEL
ALT: 11 U/L (ref 0–44)
AST: 24 U/L (ref 15–41)
Albumin: 4 g/dL (ref 3.5–5.0)
Alkaline Phosphatase: 54 U/L (ref 38–126)
Anion gap: 6 (ref 5–15)
BUN: 11 mg/dL (ref 8–23)
CO2: 29 mmol/L (ref 22–32)
Calcium: 9.1 mg/dL (ref 8.9–10.3)
Chloride: 105 mmol/L (ref 98–111)
Creatinine, Ser: 0.93 mg/dL (ref 0.44–1.00)
GFR, Estimated: >60 mL/min (ref >60)
Glucose, Bld: 85 mg/dL (ref 70–99)
Potassium: 3.8 mmol/L (ref 3.5–5.1)
Sodium: 140 mmol/L (ref 135–145)
Total Bilirubin: 0.4 mg/dL (ref 0.3–1.2)
Total Protein: 7.2 g/dL (ref 6.5–8.1)

## 2023-01-22 LAB — CBC WITH DIFFERENTIAL/PLATELET
Abs Immature Granulocytes: 0.02 10*3/uL (ref 0.00–0.07)
Basophils Absolute: 0.1 10*3/uL (ref 0.0–0.1)
Basophils Relative: 2 %
Eosinophils Absolute: 0.1 10*3/uL (ref 0.0–0.5)
Eosinophils Relative: 1 %
HCT: 34.4 % — ABNORMAL LOW (ref 36.0–46.0)
Hemoglobin: 11 g/dL — ABNORMAL LOW (ref 12.0–15.0)
Immature Granulocytes: 1 %
Lymphocytes Relative: 39 %
Lymphs Abs: 1.4 10*3/uL (ref 0.7–4.0)
MCH: 27.1 pg (ref 26.0–34.0)
MCHC: 32 g/dL (ref 30.0–36.0)
MCV: 84.7 fL (ref 80.0–100.0)
Monocytes Absolute: 0.1 10*3/uL (ref 0.1–1.0)
Monocytes Relative: 4 %
Neutro Abs: 1.9 10*3/uL (ref 1.7–7.7)
Neutrophils Relative %: 53 %
Platelets: 291 10*3/uL (ref 150–400)
RBC: 4.06 MIL/uL (ref 3.87–5.11)
RDW: 14.7 % (ref 11.5–15.5)
Smear Review: NORMAL
WBC: 3.6 10*3/uL — ABNORMAL LOW (ref 4.0–10.5)
nRBC: 0 % (ref 0.0–0.2)

## 2023-01-22 NOTE — Assessment & Plan Note (Signed)
She has progressive weight loss I spent a lot of time encouraging the patient to eat frequent small meals and additional dietary supplement, in addition to her timed meals We will reassess again next visit

## 2023-01-22 NOTE — Assessment & Plan Note (Signed)
I reexamined her wound today There is healthy granulation tissue throughout but the process is very slow I placed clean dressing changes over her wound and discussed importance of dietary supplement

## 2023-01-22 NOTE — Assessment & Plan Note (Signed)
Her pain medicine dose was recently increased Pain control is similar We will continue current same dose

## 2023-01-22 NOTE — Assessment & Plan Note (Signed)
She tolerated new treatment well I am concerned about her progressive weight loss and spent a lot of time addressing this In the meantime, she will continue Ibrance with monthly fulvestrant I plan to see her again in a few weeks for further follow-up She will get Xgeva every 3 months

## 2023-01-22 NOTE — Assessment & Plan Note (Signed)
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 

## 2023-01-22 NOTE — Progress Notes (Signed)
[facility] OFFICE PROGRESS NOTE  Patient Care Team: Artis Delay, MD as PCP - General (Hematology and Oncology)  ASSESSMENT & PLAN:  Breast cancer, stage 4, right (HCC) She tolerated new treatment well I am concerned about her progressive weight loss and spent a lot of time addressing this In the meantime, she will continue Ibrance with monthly fulvestrant I plan to see her again in a few weeks for further follow-up She will get Xgeva every 3 months  Wound healing, delayed I reexamined her wound today There is healthy granulation tissue throughout but the process is very slow I placed clean dressing changes over her wound and discussed importance of dietary supplement  Pancytopenia, acquired (HCC) This is multifactorial, related to her chemotherapy and recent bleeding She does not need transfusion support today  Cancer associated pain Her pain medicine dose was recently increased Pain control is similar We will continue current same dose  Weight loss, non-intentional She has progressive weight loss I spent a lot of time encouraging the patient to eat frequent small meals and additional dietary supplement, in addition to her timed meals We will reassess again next visit  No orders of the defined types were placed in this encounter.   All questions were answered. The patient knows to call the clinic with any problems, questions or concerns. The total time spent in the appointment was 30 minutes encounter with patients including review of chart and various tests results, discussions about plan of care and coordination of care plan   Artis Delay, MD 01/22/2023 2:50 PM  INTERVAL HISTORY: Please see below for problem oriented charting. she returns for treatment follow-up with her mother She is taking Ibrance as directed She has lost some weight She cannot provide a good answer why she has lost some weight.  Her mother eats with her once a week and noticed she only  eat about 75% of surged meal Denies fever or chills She continue daily dressing changes Nose worsening shortness of breath Her pain is about the same Denies dysphagia, nausea or constipation  REVIEW OF SYSTEMS:   Constitutional: Denies fevers, chills Eyes: Denies blurriness of vision Ears, nose, mouth, throat, and face: Denies mucositis or sore throat Respiratory: Denies cough, dyspnea or wheezes Cardiovascular: Denies palpitation, chest discomfort or lower extremity swelling Gastrointestinal:  Denies nausea, heartburn or change in bowel habits Skin: Denies abnormal skin rashes Lymphatics: Denies new lymphadenopathy or easy bruising Neurological:Denies numbness, tingling or new weaknesses Behavioral/Psych: Mood is stable, no new changes  All other systems were reviewed with the patient and are negative.  I have reviewed the past medical history, past surgical history, social history and family history with the patient and they are unchanged from previous note.  ALLERGIES:  has No Known Allergies.  MEDICATIONS:  Current Outpatient Medications  Medication Sig Dispense Refill   palbociclib (IBRANCE) 100 MG tablet Take 1 tablet (100 mg total) by mouth daily. Take for 21 days on, 7 days off, repeat every 28 days. 21 tablet 11   calcium carbonate (TUMS - DOSED IN MG ELEMENTAL CALCIUM) 500 MG chewable tablet Chew 1 tablet by mouth 2 (two) times daily.     cholecalciferol (VITAMIN D3) 25 MCG (1000 UNIT) tablet Take 2,000 Units by mouth daily.     famotidine (PEPCID) 20 MG tablet Take 1 tablet (20 mg total) by mouth 2 (two) times daily. 60 tablet 2   HYDROmorphone (DILAUDID) 8 MG tablet Take 1 tablet (8 mg total) by mouth every  6 (six) hours as needed for moderate pain. 90 tablet 0   magnesium oxide (MAG-OX) 400 (240 Mg) MG tablet Take 1 tablet (400 mg total) by mouth daily. 30 tablet 3   metoprolol tartrate (LOPRESSOR) 25 MG tablet Take 1 tablet (25 mg total) by mouth 2 (two) times daily. 60  tablet 0   metroNIDAZOLE (METROGEL) 1 % gel Apply topically in the morning and at bedtime. Apply topical 2 x day for 2 weeks 60 g 0   mirtazapine (REMERON) 7.5 MG tablet Take 1 tablet (7.5 mg total) by mouth at bedtime. 30 tablet 1   polyethylene glycol (MIRALAX / GLYCOLAX) 17 g packet Take 17 g by mouth daily.     senna (SENOKOT) 8.6 MG tablet Take 2 tablets by mouth 2 (two) times daily.     No current facility-administered medications for this visit.    SUMMARY OF ONCOLOGIC HISTORY: Oncology History Overview Note  The tumor cells are EQUIVOCAL For Her2 (2+).   Estrogen Receptor:       POSITIVE, 100%, STRONG STAINING  Progesterone Receptor:   NEGATIVE (0)  Proliferation Marker Ki-67:   45%    Breast cancer, stage 4, right  03/22/2022 Initial Diagnosis   Primary breast malignancy (HCC)   03/24/2022 Pathology Results   SURGICAL PATHOLOGY  CASE: [phone]  PATIENT: [name]  Surgical Pathology Report   Clinical History: Ulcerative right breast lesion and probable metastatic breast cancer ([facility])   FINAL MICROSCOPIC DIAGNOSIS:   A. SOFT TISSUE MASS, RIGHT UPPER CHEST WALL, NEEDLE CORE BIOPSY:  - Invasive ductal carcinoma, see comment  - Ductal carcinoma in situ: Not identified   - Tubule formation: Score 3  - Nuclear pleomorphism: Score 3  - Mitotic count: Score 3  - Total score: 9  - Overall Grade: 3  - Lymphovascular invasion: Not identified  - Cancer Length: 13 mm  - Calcifications: Not identified  - Other findings: None    03/28/2022 Cancer Staging   Staging form: Breast, AJCC 8th Edition - Clinical stage from 03/28/2022: Stage IV (cT4d, cN3c, pM1, G3, ER+, PR-, HER2: Equivocal) - Signed by Artis Delay, MD on 03/28/2022 Stage prefix: Initial diagnosis Nuclear grade: G3 Histologic grading system: 3 grade system   04/17/2022 -  Chemotherapy   She started taking combination of abemaciclib with anastrozole   06/27/2022 Imaging   1. Ulcerated mass of the right  breast with extension of nodular enhancing soft tissue to the right chest wall slight extension into the contralateral left breast, markedly decreased in size when compared with prior exam. 2. Interval decreased size of left axillary lymph node. 3. Solid pulmonary nodules are decreased in size when compared with prior exam. 4. Large right pleural effusion with complete collapse of the right lower lobe, pleural effusion is decreased in size when compared with prior exam. 5. Stable low-attenuation liver lesions which are likely simple cysts. 6. Lytic and sclerotic osseous lesions again seen, many of which demonstrate increased sclerosis, likely due to treatment response. 7. Pathologic compression fracture of T3 demonstrates increased height loss, although there is no evidence of significant retropulsion. 8. Large stool ball in the rectum, correlate for constipation.     08/02/2022 - 08/30/2022 Hospital Admission   She was admitted to the hospital due to malignant pleural effusion and was diagnosed with MSSA empyema   08/10/2022 Imaging   1. Large loculated right hydropneumothorax encompassing about 75% of right hemithoracic volume. Pigtail right pleural drainage catheter in place. 2. Moderate to large  left pleural effusion encompassing about 1/3 of the left hemithoracic volume. 3. Right axillary mass or lymph node measures 1.8 cm in short axis, formerly 1.9 cm. Other previously seen chest wall soft tissue lesions are less conspicuous on today's exam due to lack of IV contrast, but likely still present. 4. Bony destructive findings of the right fifth rib laterally compatible with metastatic disease. 5. Extensive sclerosis in the sternum favoring metastatic disease over infection. 6. Nearly complete central collapse of the T3 vertebral body with associated sclerosis and minimal posterior bony retropulsion, unchanged from prior. 7. Diffuse hazy edema/infiltration of the mediastinal and subcutaneous  adipose tissues compatible with third spacing of fluid. 8. Aortic atherosclerosis.     01/13/2023 Imaging   CT CHEST ABDOMEN PELVIS W CONTRAST  Result Date: 01/12/2023 CLINICAL DATA:  Stage IV invasive breast cancer. Ongoing chemotherapy. Assess treatment response. * Tracking Code: BO * EXAM: CT CHEST, ABDOMEN, AND PELVIS WITH CONTRAST TECHNIQUE: Multidetector CT imaging of the chest, abdomen and pelvis was performed following the standard protocol during bolus administration of intravenous contrast. RADIATION DOSE REDUCTION: This exam was performed according to the departmental dose-optimization program which includes automated exposure control, adjustment of the mA and/or kV according to patient size and/or use of iterative reconstruction technique. CONTRAST:  OMNIPAQUE IOHEXOL 300 MG/ML  SOLN COMPARISON:  CT chest 08/10/2022. Chest abdomen pelvis CT 06/25/2022. Numerous prior x-rays FINDINGS: CT CHEST FINDINGS Cardiovascular: Right side of the heart is slightly enlarged. No pericardial effusion the thoracic aorta has a normal course and caliber. Mild vascular calcifications. Mediastinum/Nodes: Slightly patulous thoracic esophagus. Preserved thyroid gland. No abnormal lymph node enlargement seen in the left axillary region, left chest wall, left axillary region or mediastinum small right hilar node on image 33 of series three, nonpathologic by size criteria slightly more prominent than usually seen. This was not as well seen on the prior CT scan of November 2023 but the study was without contrast. On the older study with contrast from 06/25/2022 this node was present and stable. Enlarged right axillary lymph nodes identified. On the study of November 2023 a node was measured at 18 by 28 mm and today this same node measures 2.7 x 1.5 cm on series 3, image 29. Lungs/Pleura: Left basilar atelectasis. No pneumothorax or effusion. No dominant left-sided lung nodule. There is some volume loss along the right  hemithorax with a small effusion and pleural thickening. Small loculated pleural effusion at the right lateral lung base as well such as series 3, image 47 measuring 7.2 by 2.6 cm. Adjacent lung opacity. There is a nodule in the right lower lobe which is noncalcified on series 7, image 74 measuring 5 mm. This area was obscured by a more significant opacity on the prior CT scans of the larger effusion and pleural drain. Attention on follow-up. Is also a spiculated nodule right upper lobe today on series 7, image 32 measuring 7 by 4 mm. In retrospect this nodule was present in November 2023 measuring 6 3 mm. In September 2023 this measured 4 mm. Slowly increasing in size. Musculoskeletal: Curvature of the spine with some degenerative changes. There is areas of sclerosis seen along several vertebral bodies, the sternum and ribs with some lytic areas consistent with bone metastases. There is severe compression of the T3 level which is unchanged from previous. The areas of sclerosis involving T4 level in the sternum appears similar. There is diffuse anasarca with several soft tissue nodules identified in the right chest wall including  dermal, subdermal along muscular. Many these areas are significantly increased. Example soft tissue nodule anteriorly on series 3, image 34 extending out to the skin measures 19 by 19 mm today and previously small amount of thickening would have measured up to 5 mm in thickness. Again several other areas are significantly increased. Previous right mastectomy. CT ABDOMEN PELVIS FINDINGS Hepatobiliary: Multiple hepatic cysts are again identified. Gallbladder is present. Patent portal vein. Pancreas: Unremarkable. No pancreatic ductal dilatation or surrounding inflammatory changes. Spleen: Normal in size without focal abnormality. Adrenals/Urinary Tract: The adrenal glands are preserved. Upper pole left-sided Bosniak 1 renal cyst again seen measuring 2.7 cm in diameter. Hounsfield unit of 5.  No specific imaging follow-up. Tiny focus elsewhere as well in the mid left kidney. No imaging follow-up. These are unchanged from previous. Preserved contours of the collapsed urinary bladder. Stomach/Bowel: Bowel is nondilated. Diffuse colonic stool. Cecum resides in the right hemipelvis. Presumed normal retrocecal air-filled appendix. Vascular/Lymphatic: Normal caliber aorta and IVC. No discrete abnormal lymph node enlargement identified in the abdomen and pelvis. Reproductive: Uterus and bilateral adnexa are unremarkable. Other: Anasarca.  No free air or free fluid identified. Musculoskeletal: Curvature of the spine. Multifocal degenerative changes of the spine and pelvis there is also lytic and sclerotic bone lesions identified along the pelvis and lumbar spine. Distribution by CT appears similar to the previous. Schmorl's node compression deformity again seen at L4. IMPRESSION: Worsening disease with multiple areas of nodular skin thickening along the right hemithorax and chest wall with additional underlying subcutaneous fat and muscular nodules. Persistent axillary lymph nodes. Small right pleural effusion seen with a loculated component on the right side of the lung base. Adjacent lung opacities. Small right-sided lung nodules identified which were not as well seen on the prior, possibly due to the level of the lung opacity but are felt to be increasing slightly. Attention on short follow-up. Mixed lucent and sclerotic bone lesions are again identified with stable compression of T3. Electronically Signed   By: Karen Kays M.D.   On: 01/12/2023 15:09        PHYSICAL EXAMINATION: ECOG PERFORMANCE STATUS: 1 - Symptomatic but completely ambulatory  Vitals:   01/22/23 1433  BP: (!) 160/90  Pulse: 98  Resp: 18  SpO2: 99%   Filed Weights   01/22/23 1433  Weight: 137 lb (62.1 kg)    GENERAL:alert, no distress and comfortable.  She looks thin and cachectic SKIN: Diffuse skin lesions, similar to  previous exam.  Healthy granulation tissue is noted on her right anterior chest wall.  I placed clean dressing changes over her wound NEURO: alert & oriented x 3 with fluent speech, no focal motor/sensory deficits  LABORATORY DATA:  I have reviewed the data as listed    Component Value Date/Time   NA 142 01/02/2023 1240   K 3.9 01/02/2023 1240   CL 107 01/02/2023 1240   CO2 29 01/02/2023 1240   GLUCOSE 107 (H) 01/02/2023 1240   BUN 13 01/02/2023 1240   CREATININE 0.97 01/02/2023 1240   CREATININE 1.03 (H) 11/28/2022 1242   CALCIUM 9.6 01/02/2023 1240   PROT 7.3 01/02/2023 1240   ALBUMIN 3.9 01/02/2023 1240   AST 20 01/02/2023 1240   AST 22 11/28/2022 1242   ALT 13 01/02/2023 1240   ALT 13 11/28/2022 1242   ALKPHOS 50 01/02/2023 1240   BILITOT 0.5 01/02/2023 1240   BILITOT 0.5 11/28/2022 1242   GFRNONAA >60 01/02/2023 1240   GFRNONAA >60 11/28/2022 1242  No results found for: "SPEP", "UPEP"  Lab Results  Component Value Date   WBC 3.6 (L) 01/22/2023   NEUTROABS PENDING 01/22/2023   HGB 11.0 (L) 01/22/2023   HCT 34.4 (L) 01/22/2023   MCV 84.7 01/22/2023   PLT 291 01/22/2023      Chemistry      Component Value Date/Time   NA 142 01/02/2023 1240   K 3.9 01/02/2023 1240   CL 107 01/02/2023 1240   CO2 29 01/02/2023 1240   BUN 13 01/02/2023 1240   CREATININE 0.97 01/02/2023 1240   CREATININE 1.03 (H) 11/28/2022 1242      Component Value Date/Time   CALCIUM 9.6 01/02/2023 1240   ALKPHOS 50 01/02/2023 1240   AST 20 01/02/2023 1240   AST 22 11/28/2022 1242   ALT 13 01/02/2023 1240   ALT 13 11/28/2022 1242   BILITOT 0.5 01/02/2023 1240   BILITOT 0.5 11/28/2022 1242       RADIOGRAPHIC STUDIES: I have personally reviewed the radiological images as listed and agreed with the findings in the report. CT CHEST ABDOMEN PELVIS W CONTRAST  Result Date: 01/12/2023 CLINICAL DATA:  Stage IV invasive breast cancer. Ongoing chemotherapy. Assess treatment response. *  Tracking Code: BO * EXAM: CT CHEST, ABDOMEN, AND PELVIS WITH CONTRAST TECHNIQUE: Multidetector CT imaging of the chest, abdomen and pelvis was performed following the standard protocol during bolus administration of intravenous contrast. RADIATION DOSE REDUCTION: This exam was performed according to the departmental dose-optimization program which includes automated exposure control, adjustment of the mA and/or kV according to patient size and/or use of iterative reconstruction technique. CONTRAST:  OMNIPAQUE IOHEXOL 300 MG/ML  SOLN COMPARISON:  CT chest 08/10/2022. Chest abdomen pelvis CT 06/25/2022. Numerous prior x-rays FINDINGS: CT CHEST FINDINGS Cardiovascular: Right side of the heart is slightly enlarged. No pericardial effusion the thoracic aorta has a normal course and caliber. Mild vascular calcifications. Mediastinum/Nodes: Slightly patulous thoracic esophagus. Preserved thyroid gland. No abnormal lymph node enlargement seen in the left axillary region, left chest wall, left axillary region or mediastinum small right hilar node on image 33 of series three, nonpathologic by size criteria slightly more prominent than usually seen. This was not as well seen on the prior CT scan of November 2023 but the study was without contrast. On the older study with contrast from 06/25/2022 this node was present and stable. Enlarged right axillary lymph nodes identified. On the study of November 2023 a node was measured at 18 by 28 mm and today this same node measures 2.7 x 1.5 cm on series 3, image 29. Lungs/Pleura: Left basilar atelectasis. No pneumothorax or effusion. No dominant left-sided lung nodule. There is some volume loss along the right hemithorax with a small effusion and pleural thickening. Small loculated pleural effusion at the right lateral lung base as well such as series 3, image 47 measuring 7.2 by 2.6 cm. Adjacent lung opacity. There is a nodule in the right lower lobe which is noncalcified on  series 7, image 74 measuring 5 mm. This area was obscured by a more significant opacity on the prior CT scans of the larger effusion and pleural drain. Attention on follow-up. Is also a spiculated nodule right upper lobe today on series 7, image 32 measuring 7 by 4 mm. In retrospect this nodule was present in November 2023 measuring 6 3 mm. In September 2023 this measured 4 mm. Slowly increasing in size. Musculoskeletal: Curvature of the spine with some degenerative changes. There is areas of  sclerosis seen along several vertebral bodies, the sternum and ribs with some lytic areas consistent with bone metastases. There is severe compression of the T3 level which is unchanged from previous. The areas of sclerosis involving T4 level in the sternum appears similar. There is diffuse anasarca with several soft tissue nodules identified in the right chest wall including dermal, subdermal along muscular. Many these areas are significantly increased. Example soft tissue nodule anteriorly on series 3, image 34 extending out to the skin measures 19 by 19 mm today and previously small amount of thickening would have measured up to 5 mm in thickness. Again several other areas are significantly increased. Previous right mastectomy. CT ABDOMEN PELVIS FINDINGS Hepatobiliary: Multiple hepatic cysts are again identified. Gallbladder is present. Patent portal vein. Pancreas: Unremarkable. No pancreatic ductal dilatation or surrounding inflammatory changes. Spleen: Normal in size without focal abnormality. Adrenals/Urinary Tract: The adrenal glands are preserved. Upper pole left-sided Bosniak 1 renal cyst again seen measuring 2.7 cm in diameter. Hounsfield unit of 5. No specific imaging follow-up. Tiny focus elsewhere as well in the mid left kidney. No imaging follow-up. These are unchanged from previous. Preserved contours of the collapsed urinary bladder. Stomach/Bowel: Bowel is nondilated. Diffuse colonic stool. Cecum resides in  the right hemipelvis. Presumed normal retrocecal air-filled appendix. Vascular/Lymphatic: Normal caliber aorta and IVC. No discrete abnormal lymph node enlargement identified in the abdomen and pelvis. Reproductive: Uterus and bilateral adnexa are unremarkable. Other: Anasarca.  No free air or free fluid identified. Musculoskeletal: Curvature of the spine. Multifocal degenerative changes of the spine and pelvis there is also lytic and sclerotic bone lesions identified along the pelvis and lumbar spine. Distribution by CT appears similar to the previous. Schmorl's node compression deformity again seen at L4. IMPRESSION: Worsening disease with multiple areas of nodular skin thickening along the right hemithorax and chest wall with additional underlying subcutaneous fat and muscular nodules. Persistent axillary lymph nodes. Small right pleural effusion seen with a loculated component on the right side of the lung base. Adjacent lung opacities. Small right-sided lung nodules identified which were not as well seen on the prior, possibly due to the level of the lung opacity but are felt to be increasing slightly. Attention on short follow-up. Mixed lucent and sclerotic bone lesions are again identified with stable compression of T3. Electronically Signed   By: Karen Kays M.D.   On: 01/12/2023 15:09

## 2023-01-23 ENCOUNTER — Telehealth: Payer: Self-pay | Admitting: Hematology and Oncology

## 2023-01-23 NOTE — Telephone Encounter (Signed)
Patient called to see if she could move her appointment to Monday or Friday reached out to patient. She decided to leave it as is.

## 2023-01-29 ENCOUNTER — Other Ambulatory Visit (HOSPITAL_COMMUNITY): Payer: Self-pay

## 2023-02-03 ENCOUNTER — Other Ambulatory Visit: Payer: Self-pay

## 2023-02-12 ENCOUNTER — Inpatient Hospital Stay: Payer: BC Managed Care – PPO | Attending: Hematology and Oncology | Admitting: Hematology and Oncology

## 2023-02-12 ENCOUNTER — Other Ambulatory Visit: Payer: Self-pay

## 2023-02-12 ENCOUNTER — Inpatient Hospital Stay: Payer: BC Managed Care – PPO

## 2023-02-12 ENCOUNTER — Other Ambulatory Visit (HOSPITAL_COMMUNITY): Payer: Self-pay

## 2023-02-12 VITALS — BP 155/91 | HR 125 | Temp 98.4°F | Resp 18 | Ht 66.0 in | Wt 136.4 lb

## 2023-02-12 DIAGNOSIS — C50911 Malignant neoplasm of unspecified site of right female breast: Secondary | ICD-10-CM

## 2023-02-12 DIAGNOSIS — T148XXD Other injury of unspecified body region, subsequent encounter: Secondary | ICD-10-CM | POA: Diagnosis not present

## 2023-02-12 DIAGNOSIS — Z9011 Acquired absence of right breast and nipple: Secondary | ICD-10-CM | POA: Insufficient documentation

## 2023-02-12 DIAGNOSIS — G893 Neoplasm related pain (acute) (chronic): Secondary | ICD-10-CM | POA: Insufficient documentation

## 2023-02-12 DIAGNOSIS — R634 Abnormal weight loss: Secondary | ICD-10-CM

## 2023-02-12 DIAGNOSIS — D649 Anemia, unspecified: Secondary | ICD-10-CM | POA: Diagnosis not present

## 2023-02-12 DIAGNOSIS — D72819 Decreased white blood cell count, unspecified: Secondary | ICD-10-CM | POA: Diagnosis not present

## 2023-02-12 DIAGNOSIS — Z79899 Other long term (current) drug therapy: Secondary | ICD-10-CM | POA: Diagnosis not present

## 2023-02-12 DIAGNOSIS — Z170 Estrogen receptor positive status [ER+]: Secondary | ICD-10-CM | POA: Insufficient documentation

## 2023-02-12 LAB — COMPREHENSIVE METABOLIC PANEL
ALT: 12 U/L (ref 0–44)
AST: 29 U/L (ref 15–41)
Albumin: 4.2 g/dL (ref 3.5–5.0)
Alkaline Phosphatase: 50 U/L (ref 38–126)
Anion gap: 6 (ref 5–15)
BUN: 13 mg/dL (ref 8–23)
CO2: 29 mmol/L (ref 22–32)
Calcium: 9 mg/dL (ref 8.9–10.3)
Chloride: 106 mmol/L (ref 98–111)
Creatinine, Ser: 0.91 mg/dL (ref 0.44–1.00)
GFR, Estimated: >60 mL/min (ref >60)
Glucose, Bld: 92 mg/dL (ref 70–99)
Potassium: 3.9 mmol/L (ref 3.5–5.1)
Sodium: 141 mmol/L (ref 135–145)
Total Bilirubin: 0.5 mg/dL (ref 0.3–1.2)
Total Protein: 7.7 g/dL (ref 6.5–8.1)

## 2023-02-12 LAB — CBC WITH DIFFERENTIAL/PLATELET
Abs Immature Granulocytes: 0 10*3/uL (ref 0.00–0.07)
Basophils Absolute: 0 10*3/uL (ref 0.0–0.1)
Basophils Relative: 1 %
Eosinophils Absolute: 0 10*3/uL (ref 0.0–0.5)
Eosinophils Relative: 0 %
HCT: 37.1 % (ref 36.0–46.0)
Hemoglobin: 11.9 g/dL — ABNORMAL LOW (ref 12.0–15.0)
Immature Granulocytes: 0 %
Lymphocytes Relative: 40 %
Lymphs Abs: 1 10*3/uL (ref 0.7–4.0)
MCH: 27.8 pg (ref 26.0–34.0)
MCHC: 32.1 g/dL (ref 30.0–36.0)
MCV: 86.7 fL (ref 80.0–100.0)
Monocytes Absolute: 0.2 10*3/uL (ref 0.1–1.0)
Monocytes Relative: 10 %
Neutro Abs: 1.1 10*3/uL — ABNORMAL LOW (ref 1.7–7.7)
Neutrophils Relative %: 49 %
Platelets: 288 10*3/uL (ref 150–400)
RBC: 4.28 MIL/uL (ref 3.87–5.11)
RDW: 15.9 % — ABNORMAL HIGH (ref 11.5–15.5)
WBC: 2.4 10*3/uL — ABNORMAL LOW (ref 4.0–10.5)
nRBC: 0 % (ref 0.0–0.2)

## 2023-02-12 MED ORDER — MORPHINE SULFATE ER 30 MG PO TBCR
30.0000 mg | EXTENDED_RELEASE_TABLET | Freq: Two times a day (BID) | ORAL | 0 refills | Status: DC
  Filled 2023-02-12 (×2): qty 60, 30d supply, fill #0

## 2023-02-12 MED ORDER — FULVESTRANT 250 MG/5ML IM SOSY
500.0000 mg | PREFILLED_SYRINGE | Freq: Once | INTRAMUSCULAR | Status: AC
  Administered 2023-02-12: 500 mg via INTRAMUSCULAR
  Filled 2023-02-12: qty 10

## 2023-02-12 MED ORDER — MORPHINE SULFATE ER 60 MG PO TBCR
60.0000 mg | EXTENDED_RELEASE_TABLET | Freq: Two times a day (BID) | ORAL | 0 refills | Status: DC
  Filled 2023-02-12: qty 60, 30d supply, fill #0

## 2023-02-12 NOTE — Progress Notes (Signed)
Pt. Here for injection of Faslodex.  C/O left side of buttock/ skin irritated and burning when cleaning with alcohol.  States due to her Washing Detergent causing irritation.  Injection given as requested

## 2023-02-13 ENCOUNTER — Encounter: Payer: Self-pay | Admitting: Hematology and Oncology

## 2023-02-13 NOTE — Assessment & Plan Note (Signed)
I reexamined her wound today There is minimum wound healing We addressed nutritional intake today

## 2023-02-13 NOTE — Assessment & Plan Note (Signed)
She tolerated new treatment well except for mild progressive leukopenia Anemia is stable I am concerned about her progressive weight loss and spent a lot of time addressing this I suspect she is non compliant with taking her medications correctly and not taking in enough nutrition In the meantime, she will continue Ibrance with monthly fulvestrant I plan to see her again in 2 weeks for further follow-up She will get Xgeva every 3 months

## 2023-02-13 NOTE — Assessment & Plan Note (Signed)
She has progressive weight loss I spent a lot of time encouraging the patient to eat frequent small meals and additional dietary supplement, in addition to her timed meals We will reassess again next visit; I gave her flowsheet to document oral intake for next 2 weeks

## 2023-02-13 NOTE — Assessment & Plan Note (Signed)
She stated she has minimal pain relief despite recent dose change She has only refilled her pain medications twice in the last 5 months I suspect she has not been taking her medications as stated We discussed risks and benefits of long acting pain medication preparation; she agreed to try MS contin

## 2023-02-13 NOTE — Progress Notes (Signed)
[facility] OFFICE PROGRESS NOTE  Patient Care Team: Artis Delay, MD as PCP - General (Hematology and Oncology)  ASSESSMENT & PLAN:  Breast cancer, stage 4, right (HCC) She tolerated new treatment well except for mild progressive leukopenia Anemia is stable I am concerned about her progressive weight loss and spent a lot of time addressing this I suspect she is non compliant with taking her medications correctly and not taking in enough nutrition In the meantime, she will continue Ibrance with monthly fulvestrant I plan to see her again in 2 weeks for further follow-up She will get Xgeva every 3 months  Wound healing, delayed I reexamined her wound today There is minimum wound healing We addressed nutritional intake today  Weight loss, non-intentional She has progressive weight loss I spent a lot of time encouraging the patient to eat frequent small meals and additional dietary supplement, in addition to her timed meals We will reassess again next visit; I gave her flowsheet to document oral intake for next 2 weeks  Cancer associated pain She stated she has minimal pain relief despite recent dose change She has only refilled her pain medications twice in the last 5 months I suspect she has not been taking her medications as stated We discussed risks and benefits of long acting pain medication preparation; she agreed to try MS contin  No orders of the defined types were placed in this encounter.   All questions were answered. The patient knows to call the clinic with any problems, questions or concerns. The total time spent in the appointment was 40 minutes encounter with patients including review of chart and various tests results, discussions about plan of care and coordination of care plan   Artis Delay, MD 02/13/2023 7:49 AM  INTERVAL HISTORY: Please see below for problem oriented charting. she returns for treatment follow-up with her mother She is more than  30 mins late She stated she has no pain relief despite taking higher dose dilaudid last few weeks Despite nausea, constipation or sedation She stated she eats good meals but yet no weight gain is appreciated I spent a lot of time with discussion on pain management, nutritional intake and overall plan of care  REVIEW OF SYSTEMS:   Constitutional: Denies fevers, chills or abnormal weight loss Eyes: Denies blurriness of vision Ears, nose, mouth, throat, and face: Denies mucositis or sore throat Respiratory: Denies cough, dyspnea or wheezes Cardiovascular: Denies palpitation, chest discomfort or lower extremity swelling Gastrointestinal:  Denies nausea, heartburn or change in bowel habits Skin: Denies abnormal skin rashes Lymphatics: Denies new lymphadenopathy or easy bruising Neurological:Denies numbness, tingling or new weaknesses Behavioral/Psych: Mood is stable, no new changes  All other systems were reviewed with the patient and are negative.  I have reviewed the past medical history, past surgical history, social history and family history with the patient and they are unchanged from previous note.  ALLERGIES:  has No Known Allergies.  MEDICATIONS:  Current Outpatient Medications  Medication Sig Dispense Refill   morphine (MS CONTIN) 30 MG 12 hr tablet Take 1 tablet (30 mg total) by mouth every 12 (twelve) hours. 60 tablet 0   palbociclib (IBRANCE) 100 MG tablet Take 1 tablet (100 mg total) by mouth daily. Take for 21 days on, 7 days off, repeat every 28 days. 21 tablet 11   calcium carbonate (TUMS - DOSED IN MG ELEMENTAL CALCIUM) 500 MG chewable tablet Chew 1 tablet by mouth 2 (two) times daily.  cholecalciferol (VITAMIN D3) 25 MCG (1000 UNIT) tablet Take 2,000 Units by mouth daily.     famotidine (PEPCID) 20 MG tablet Take 1 tablet (20 mg total) by mouth 2 (two) times daily. 60 tablet 2   HYDROmorphone (DILAUDID) 8 MG tablet Take 1 tablet (8 mg total) by mouth every 6 (six)  hours as needed for moderate pain. 90 tablet 0   magnesium oxide (MAG-OX) 400 (240 Mg) MG tablet Take 1 tablet (400 mg total) by mouth daily. 30 tablet 3   metoprolol tartrate (LOPRESSOR) 25 MG tablet Take 1 tablet (25 mg total) by mouth 2 (two) times daily. 60 tablet 0   metroNIDAZOLE (METROGEL) 1 % gel Apply topically in the morning and at bedtime. Apply topical 2 x day for 2 weeks 60 g 0   mirtazapine (REMERON) 7.5 MG tablet Take 1 tablet (7.5 mg total) by mouth at bedtime. 30 tablet 1   polyethylene glycol (MIRALAX / GLYCOLAX) 17 g packet Take 17 g by mouth daily.     senna (SENOKOT) 8.6 MG tablet Take 2 tablets by mouth 2 (two) times daily.     No current facility-administered medications for this visit.    SUMMARY OF ONCOLOGIC HISTORY: Oncology History Overview Note  The tumor cells are EQUIVOCAL For Her2 (2+).   Estrogen Receptor:       POSITIVE, 100%, STRONG STAINING  Progesterone Receptor:   NEGATIVE (0)  Proliferation Marker Ki-67:   45%    Breast cancer, stage 4, right (HCC)  03/22/2022 Initial Diagnosis   Primary breast malignancy (HCC)   03/24/2022 Pathology Results   SURGICAL PATHOLOGY  CASE: [phone]  PATIENT: [name]  Surgical Pathology Report   Clinical History: Ulcerative right breast lesion and probable metastatic breast cancer ([facility])   FINAL MICROSCOPIC DIAGNOSIS:   A. SOFT TISSUE MASS, RIGHT UPPER CHEST WALL, NEEDLE CORE BIOPSY:  - Invasive ductal carcinoma, see comment  - Ductal carcinoma in situ: Not identified   - Tubule formation: Score 3  - Nuclear pleomorphism: Score 3  - Mitotic count: Score 3  - Total score: 9  - Overall Grade: 3  - Lymphovascular invasion: Not identified  - Cancer Length: 13 mm  - Calcifications: Not identified  - Other findings: None    03/28/2022 Cancer Staging   Staging form: Breast, AJCC 8th Edition - Clinical stage from 03/28/2022: Stage IV (cT4d, cN3c, pM1, G3, ER+, PR-, HER2: Equivocal) - Signed by Artis Delay, MD on 03/28/2022 Stage prefix: Initial diagnosis Nuclear grade: G3 Histologic grading system: 3 grade system   04/17/2022 -  Chemotherapy   She started taking combination of abemaciclib with anastrozole   06/27/2022 Imaging   1. Ulcerated mass of the right breast with extension of nodular enhancing soft tissue to the right chest wall slight extension into the contralateral left breast, markedly decreased in size when compared with prior exam. 2. Interval decreased size of left axillary lymph node. 3. Solid pulmonary nodules are decreased in size when compared with prior exam. 4. Large right pleural effusion with complete collapse of the right lower lobe, pleural effusion is decreased in size when compared with prior exam. 5. Stable low-attenuation liver lesions which are likely simple cysts. 6. Lytic and sclerotic osseous lesions again seen, many of which demonstrate increased sclerosis, likely due to treatment response. 7. Pathologic compression fracture of T3 demonstrates increased height loss, although there is no evidence of significant retropulsion. 8. Large stool ball in the rectum, correlate for constipation.  08/02/2022 - 08/30/2022 Hospital Admission   She was admitted to the hospital due to malignant pleural effusion and was diagnosed with MSSA empyema   08/10/2022 Imaging   1. Large loculated right hydropneumothorax encompassing about 75% of right hemithoracic volume. Pigtail right pleural drainage catheter in place. 2. Moderate to large left pleural effusion encompassing about 1/3 of the left hemithoracic volume. 3. Right axillary mass or lymph node measures 1.8 cm in short axis, formerly 1.9 cm. Other previously seen chest wall soft tissue lesions are less conspicuous on today's exam due to lack of IV contrast, but likely still present. 4. Bony destructive findings of the right fifth rib laterally compatible with metastatic disease. 5. Extensive sclerosis in the sternum  favoring metastatic disease over infection. 6. Nearly complete central collapse of the T3 vertebral body with associated sclerosis and minimal posterior bony retropulsion, unchanged from prior. 7. Diffuse hazy edema/infiltration of the mediastinal and subcutaneous adipose tissues compatible with third spacing of fluid. 8. Aortic atherosclerosis.     01/13/2023 Imaging   CT CHEST ABDOMEN PELVIS W CONTRAST  Result Date: 01/12/2023 CLINICAL DATA:  Stage IV invasive breast cancer. Ongoing chemotherapy. Assess treatment response. * Tracking Code: BO * EXAM: CT CHEST, ABDOMEN, AND PELVIS WITH CONTRAST TECHNIQUE: Multidetector CT imaging of the chest, abdomen and pelvis was performed following the standard protocol during bolus administration of intravenous contrast. RADIATION DOSE REDUCTION: This exam was performed according to the departmental dose-optimization program which includes automated exposure control, adjustment of the mA and/or kV according to patient size and/or use of iterative reconstruction technique. CONTRAST:  OMNIPAQUE IOHEXOL 300 MG/ML  SOLN COMPARISON:  CT chest 08/10/2022. Chest abdomen pelvis CT 06/25/2022. Numerous prior x-rays FINDINGS: CT CHEST FINDINGS Cardiovascular: Right side of the heart is slightly enlarged. No pericardial effusion the thoracic aorta has a normal course and caliber. Mild vascular calcifications. Mediastinum/Nodes: Slightly patulous thoracic esophagus. Preserved thyroid gland. No abnormal lymph node enlargement seen in the left axillary region, left chest wall, left axillary region or mediastinum small right hilar node on image 33 of series three, nonpathologic by size criteria slightly more prominent than usually seen. This was not as well seen on the prior CT scan of November 2023 but the study was without contrast. On the older study with contrast from 06/25/2022 this node was present and stable. Enlarged right axillary lymph nodes identified. On the study  of November 2023 a node was measured at 18 by 28 mm and today this same node measures 2.7 x 1.5 cm on series 3, image 29. Lungs/Pleura: Left basilar atelectasis. No pneumothorax or effusion. No dominant left-sided lung nodule. There is some volume loss along the right hemithorax with a small effusion and pleural thickening. Small loculated pleural effusion at the right lateral lung base as well such as series 3, image 47 measuring 7.2 by 2.6 cm. Adjacent lung opacity. There is a nodule in the right lower lobe which is noncalcified on series 7, image 74 measuring 5 mm. This area was obscured by a more significant opacity on the prior CT scans of the larger effusion and pleural drain. Attention on follow-up. Is also a spiculated nodule right upper lobe today on series 7, image 32 measuring 7 by 4 mm. In retrospect this nodule was present in November 2023 measuring 6 3 mm. In September 2023 this measured 4 mm. Slowly increasing in size. Musculoskeletal: Curvature of the spine with some degenerative changes. There is areas of sclerosis seen along several vertebral bodies,  the sternum and ribs with some lytic areas consistent with bone metastases. There is severe compression of the T3 level which is unchanged from previous. The areas of sclerosis involving T4 level in the sternum appears similar. There is diffuse anasarca with several soft tissue nodules identified in the right chest wall including dermal, subdermal along muscular. Many these areas are significantly increased. Example soft tissue nodule anteriorly on series 3, image 34 extending out to the skin measures 19 by 19 mm today and previously small amount of thickening would have measured up to 5 mm in thickness. Again several other areas are significantly increased. Previous right mastectomy. CT ABDOMEN PELVIS FINDINGS Hepatobiliary: Multiple hepatic cysts are again identified. Gallbladder is present. Patent portal vein. Pancreas: Unremarkable. No pancreatic  ductal dilatation or surrounding inflammatory changes. Spleen: Normal in size without focal abnormality. Adrenals/Urinary Tract: The adrenal glands are preserved. Upper pole left-sided Bosniak 1 renal cyst again seen measuring 2.7 cm in diameter. Hounsfield unit of 5. No specific imaging follow-up. Tiny focus elsewhere as well in the mid left kidney. No imaging follow-up. These are unchanged from previous. Preserved contours of the collapsed urinary bladder. Stomach/Bowel: Bowel is nondilated. Diffuse colonic stool. Cecum resides in the right hemipelvis. Presumed normal retrocecal air-filled appendix. Vascular/Lymphatic: Normal caliber aorta and IVC. No discrete abnormal lymph node enlargement identified in the abdomen and pelvis. Reproductive: Uterus and bilateral adnexa are unremarkable. Other: Anasarca.  No free air or free fluid identified. Musculoskeletal: Curvature of the spine. Multifocal degenerative changes of the spine and pelvis there is also lytic and sclerotic bone lesions identified along the pelvis and lumbar spine. Distribution by CT appears similar to the previous. Schmorl's node compression deformity again seen at L4. IMPRESSION: Worsening disease with multiple areas of nodular skin thickening along the right hemithorax and chest wall with additional underlying subcutaneous fat and muscular nodules. Persistent axillary lymph nodes. Small right pleural effusion seen with a loculated component on the right side of the lung base. Adjacent lung opacities. Small right-sided lung nodules identified which were not as well seen on the prior, possibly due to the level of the lung opacity but are felt to be increasing slightly. Attention on short follow-up. Mixed lucent and sclerotic bone lesions are again identified with stable compression of T3. Electronically Signed   By: Karen Kays M.D.   On: 01/12/2023 15:09        PHYSICAL EXAMINATION: ECOG PERFORMANCE STATUS: 1 - Symptomatic but completely  ambulatory  Vitals:   02/12/23 1458  BP: (!) 155/91  Pulse: (!) 125  Resp: 18  Temp: 98.4 F (36.9 C)  SpO2: 100%   Filed Weights   02/12/23 1458  Weight: 136 lb 6.4 oz (61.9 kg)    GENERAL:alert, no distress and comfortable SKIN: there is minimum wound healing. Ulceration note on her back and front of her right chest wall EYES: normal, Conjunctiva are pink and non-injected, sclera clear OROPHARYNX:no exudate, no erythema and lips, buccal mucosa, and tongue normal  NECK: supple, thyroid normal size, non-tender, without nodularity LYMPH:  no palpable lymphadenopathy in the cervical, axillary or inguinal LUNGS: clear to auscultation and percussion with normal breathing effort HEART: regular rate & rhythm and no murmurs and no lower extremity edema ABDOMEN:abdomen soft, non-tender and normal bowel sounds Musculoskeletal:no cyanosis of digits and no clubbing  NEURO: alert & oriented x 3 with fluent speech, no focal motor/sensory deficits  LABORATORY DATA:  I have reviewed the data as listed    Component Value  Date/Time   NA 141 02/12/2023 1414   K 3.9 02/12/2023 1414   CL 106 02/12/2023 1414   CO2 29 02/12/2023 1414   GLUCOSE 92 02/12/2023 1414   BUN 13 02/12/2023 1414   CREATININE 0.91 02/12/2023 1414   CREATININE 1.03 (H) 11/28/2022 1242   CALCIUM 9.0 02/12/2023 1414   PROT 7.7 02/12/2023 1414   ALBUMIN 4.2 02/12/2023 1414   AST 29 02/12/2023 1414   AST 22 11/28/2022 1242   ALT 12 02/12/2023 1414   ALT 13 11/28/2022 1242   ALKPHOS 50 02/12/2023 1414   BILITOT 0.5 02/12/2023 1414   BILITOT 0.5 11/28/2022 1242   GFRNONAA >60 02/12/2023 1414   GFRNONAA >60 11/28/2022 1242    No results found for: "SPEP", "UPEP"  Lab Results  Component Value Date   WBC 2.4 (L) 02/12/2023   NEUTROABS 1.1 (L) 02/12/2023   HGB 11.9 (L) 02/12/2023   HCT 37.1 02/12/2023   MCV 86.7 02/12/2023   PLT 288 02/12/2023      Chemistry      Component Value Date/Time   NA 141  02/12/2023 1414   K 3.9 02/12/2023 1414   CL 106 02/12/2023 1414   CO2 29 02/12/2023 1414   BUN 13 02/12/2023 1414   CREATININE 0.91 02/12/2023 1414   CREATININE 1.03 (H) 11/28/2022 1242      Component Value Date/Time   CALCIUM 9.0 02/12/2023 1414   ALKPHOS 50 02/12/2023 1414   AST 29 02/12/2023 1414   AST 22 11/28/2022 1242   ALT 12 02/12/2023 1414   ALT 13 11/28/2022 1242   BILITOT 0.5 02/12/2023 1414   BILITOT 0.5 11/28/2022 1242

## 2023-02-16 ENCOUNTER — Other Ambulatory Visit (HOSPITAL_COMMUNITY): Payer: Self-pay

## 2023-02-26 ENCOUNTER — Inpatient Hospital Stay: Payer: BC Managed Care – PPO

## 2023-02-26 ENCOUNTER — Telehealth: Payer: Self-pay

## 2023-02-26 ENCOUNTER — Encounter: Payer: Self-pay | Admitting: Hematology and Oncology

## 2023-02-26 ENCOUNTER — Inpatient Hospital Stay (HOSPITAL_BASED_OUTPATIENT_CLINIC_OR_DEPARTMENT_OTHER): Payer: BC Managed Care – PPO | Admitting: Hematology and Oncology

## 2023-02-26 ENCOUNTER — Other Ambulatory Visit: Payer: Self-pay

## 2023-02-26 ENCOUNTER — Other Ambulatory Visit (HOSPITAL_COMMUNITY): Payer: Self-pay

## 2023-02-26 VITALS — BP 152/87 | HR 120 | Temp 98.0°F | Resp 18 | Ht 66.0 in | Wt 137.2 lb

## 2023-02-26 DIAGNOSIS — G893 Neoplasm related pain (acute) (chronic): Secondary | ICD-10-CM

## 2023-02-26 DIAGNOSIS — T148XXD Other injury of unspecified body region, subsequent encounter: Secondary | ICD-10-CM

## 2023-02-26 DIAGNOSIS — C50911 Malignant neoplasm of unspecified site of right female breast: Secondary | ICD-10-CM

## 2023-02-26 LAB — COMPREHENSIVE METABOLIC PANEL
ALT: 10 U/L (ref 0–44)
AST: 24 U/L (ref 15–41)
Albumin: 3.9 g/dL (ref 3.5–5.0)
Alkaline Phosphatase: 50 U/L (ref 38–126)
Anion gap: 6 (ref 5–15)
BUN: 14 mg/dL (ref 8–23)
CO2: 29 mmol/L (ref 22–32)
Calcium: 8.7 mg/dL — ABNORMAL LOW (ref 8.9–10.3)
Chloride: 106 mmol/L (ref 98–111)
Creatinine, Ser: 1.02 mg/dL — ABNORMAL HIGH (ref 0.44–1.00)
GFR, Estimated: >60 mL/min (ref >60)
Glucose, Bld: 111 mg/dL — ABNORMAL HIGH (ref 70–99)
Potassium: 4.5 mmol/L (ref 3.5–5.1)
Sodium: 141 mmol/L (ref 135–145)
Total Bilirubin: 0.5 mg/dL (ref 0.3–1.2)
Total Protein: 7.3 g/dL (ref 6.5–8.1)

## 2023-02-26 LAB — CBC WITH DIFFERENTIAL/PLATELET
Abs Immature Granulocytes: 0 10*3/uL (ref 0.00–0.07)
Basophils Absolute: 0.1 10*3/uL (ref 0.0–0.1)
Basophils Relative: 2 %
Eosinophils Absolute: 0 10*3/uL (ref 0.0–0.5)
Eosinophils Relative: 1 %
HCT: 33.8 % — ABNORMAL LOW (ref 36.0–46.0)
Hemoglobin: 10.9 g/dL — ABNORMAL LOW (ref 12.0–15.0)
Immature Granulocytes: 0 %
Lymphocytes Relative: 31 %
Lymphs Abs: 1.2 10*3/uL (ref 0.7–4.0)
MCH: 27.6 pg (ref 26.0–34.0)
MCHC: 32.2 g/dL (ref 30.0–36.0)
MCV: 85.6 fL (ref 80.0–100.0)
Monocytes Absolute: 0.2 10*3/uL (ref 0.1–1.0)
Monocytes Relative: 5 %
Neutro Abs: 2.3 10*3/uL (ref 1.7–7.7)
Neutrophils Relative %: 61 %
Platelets: 329 10*3/uL (ref 150–400)
RBC: 3.95 MIL/uL (ref 3.87–5.11)
RDW: 15 % (ref 11.5–15.5)
Smear Review: NORMAL
WBC: 3.8 10*3/uL — ABNORMAL LOW (ref 4.0–10.5)
nRBC: 0 % (ref 0.0–0.2)

## 2023-02-26 MED ORDER — HYDROMORPHONE HCL 8 MG PO TABS
8.0000 mg | ORAL_TABLET | Freq: Four times a day (QID) | ORAL | 0 refills | Status: DC | PRN
  Filled 2023-02-26 (×2): qty 90, 23d supply, fill #0

## 2023-02-26 NOTE — Assessment & Plan Note (Signed)
On clinical examination, she is noted to have worsening growth of tumor near the right clavicle I am concerned that her current treatment may not be helping I plan to order CT imaging in 2 weeks for further follow-up and see her back to review test results

## 2023-02-26 NOTE — Telephone Encounter (Signed)
Attempted to call to give 6/13 CT appt details. Will call back later to give appt.

## 2023-02-26 NOTE — Assessment & Plan Note (Signed)
She stated she has minimal pain relief despite recent dose change She has only refilled her pain medications twice in the last 5 months Her last Dilaudid refill was almost 8 weeks ago I suspect she has not been taking her medications as stated I refilled her prescription today and recommend the patient to bring all pill bottles with her in her next visit

## 2023-02-26 NOTE — Progress Notes (Signed)
[facility] OFFICE PROGRESS NOTE  Patient Care Team: Artis Delay, MD as PCP - General (Hematology and Oncology)  ASSESSMENT & PLAN:  Breast cancer, stage 4, right (HCC) On clinical examination, she is noted to have worsening growth of tumor near the right clavicle I am concerned that her current treatment may not be helping I plan to order CT imaging in 2 weeks for further follow-up and see her back to review test results  Cancer associated pain She stated she has minimal pain relief despite recent dose change She has only refilled her pain medications twice in the last 5 months Her last Dilaudid refill was almost 8 weeks ago I suspect she has not been taking her medications as stated I refilled her prescription today and recommend the patient to bring all pill bottles with her in her next visit  Wound healing, delayed I reexamined her wound today There is minimum wound healing We addressed nutritional intake today  Orders Placed This Encounter  Procedures   CT CHEST ABDOMEN PELVIS W CONTRAST    Standing Status:   Future    Standing Expiration Date:   02/26/2024    Scheduling Instructions:     No oral contrast    Order Specific Question:   If indicated for the ordered procedure, I authorize the administration of contrast media per Radiology protocol    Answer:   Yes    Order Specific Question:   Does the patient have a contrast media/X-ray dye allergy?    Answer:   No    Order Specific Question:   Preferred imaging location?    Answer:   [facility]    Order Specific Question:   If indicated for the ordered procedure, I authorize the administration of oral contrast media per Radiology protocol    Answer:   Yes    All questions were answered. The patient knows to call the clinic with any problems, questions or concerns. The total time spent in the appointment was 40 minutes encounter with patients including review of chart and various tests results,  discussions about plan of care and coordination of care plan   Artis Delay, MD 02/26/2023 3:09 PM  INTERVAL HISTORY: Please see below for problem oriented charting. she returns for treatment follow-up on treatment She stated that she is in a lot of pain We discussed pain management She stated she is taking MS Contin and Dilaudid as instructed and required medication refill We discussed medical compliance and I recommend the patient to bring her pill bottles in her next visit She noticed some active bleeding intermittently over her chest wall She is eating better and has gained some weight  REVIEW OF SYSTEMS:   Constitutional: Denies fevers, chills or abnormal weight loss Eyes: Denies blurriness of vision Ears, nose, mouth, throat, and face: Denies mucositis or sore throat Respiratory: Denies cough, dyspnea or wheezes Cardiovascular: Denies palpitation, chest discomfort or lower extremity swelling Gastrointestinal:  Denies nausea, heartburn or change in bowel habits Lymphatics: Denies new lymphadenopathy or easy bruising Neurological:Denies numbness, tingling or new weaknesses Behavioral/Psych: Mood is stable, no new changes  All other systems were reviewed with the patient and are negative.  I have reviewed the past medical history, past surgical history, social history and family history with the patient and they are unchanged from previous note.  ALLERGIES:  has No Known Allergies.  MEDICATIONS:  Current Outpatient Medications  Medication Sig Dispense Refill   palbociclib (IBRANCE) 100 MG tablet Take 1  tablet (100 mg total) by mouth daily. Take for 21 days on, 7 days off, repeat every 28 days. 21 tablet 11   calcium carbonate (TUMS - DOSED IN MG ELEMENTAL CALCIUM) 500 MG chewable tablet Chew 1 tablet by mouth 2 (two) times daily.     cholecalciferol (VITAMIN D3) 25 MCG (1000 UNIT) tablet Take 2,000 Units by mouth daily.     famotidine (PEPCID) 20 MG tablet Take 1 tablet (20 mg  total) by mouth 2 (two) times daily. 60 tablet 2   HYDROmorphone (DILAUDID) 8 MG tablet Take 1 tablet (8 mg total) by mouth every 6 (six) hours as needed for moderate pain. 90 tablet 0   magnesium oxide (MAG-OX) 400 (240 Mg) MG tablet Take 1 tablet (400 mg total) by mouth daily. 30 tablet 3   metoprolol tartrate (LOPRESSOR) 25 MG tablet Take 1 tablet (25 mg total) by mouth 2 (two) times daily. 60 tablet 0   metroNIDAZOLE (METROGEL) 1 % gel Apply topically in the morning and at bedtime. Apply topical 2 x day for 2 weeks 60 g 0   mirtazapine (REMERON) 7.5 MG tablet Take 1 tablet (7.5 mg total) by mouth at bedtime. 30 tablet 1   morphine (MS CONTIN) 30 MG 12 hr tablet Take 1 tablet (30 mg total) by mouth every 12 (twelve) hours. 60 tablet 0   polyethylene glycol (MIRALAX / GLYCOLAX) 17 g packet Take 17 g by mouth daily.     senna (SENOKOT) 8.6 MG tablet Take 2 tablets by mouth 2 (two) times daily.     No current facility-administered medications for this visit.    SUMMARY OF ONCOLOGIC HISTORY: Oncology History Overview Note  The tumor cells are EQUIVOCAL For Her2 (2+).   Estrogen Receptor:       POSITIVE, 100%, STRONG STAINING  Progesterone Receptor:   NEGATIVE (0)  Proliferation Marker Ki-67:   45%    Breast cancer, stage 4, right (HCC)  03/22/2022 Initial Diagnosis   Primary breast malignancy (HCC)   03/24/2022 Pathology Results   SURGICAL PATHOLOGY  CASE: [id number]  PATIENT: [name]  Surgical Pathology Report   Clinical History: Ulcerative right breast lesion and probable metastatic breast cancer ([facility])   FINAL MICROSCOPIC DIAGNOSIS:   A. SOFT TISSUE MASS, RIGHT UPPER CHEST WALL, NEEDLE CORE BIOPSY:  - Invasive ductal carcinoma, see comment  - Ductal carcinoma in situ: Not identified   - Tubule formation: Score 3  - Nuclear pleomorphism: Score 3  - Mitotic count: Score 3  - Total score: 9  - Overall Grade: 3  - Lymphovascular invasion: Not identified  - Cancer  Length: 13 mm  - Calcifications: Not identified  - Other findings: None    03/28/2022 Cancer Staging   Staging form: Breast, AJCC 8th Edition - Clinical stage from 03/28/2022: Stage IV (cT4d, cN3c, pM1, G3, ER+, PR-, HER2: Equivocal) - Signed by Artis Delay, MD on 03/28/2022 Stage prefix: Initial diagnosis Nuclear grade: G3 Histologic grading system: 3 grade system   04/17/2022 -  Chemotherapy   She started taking combination of abemaciclib with anastrozole   06/27/2022 Imaging   1. Ulcerated mass of the right breast with extension of nodular enhancing soft tissue to the right chest wall slight extension into the contralateral left breast, markedly decreased in size when compared with prior exam. 2. Interval decreased size of left axillary lymph node. 3. Solid pulmonary nodules are decreased in size when compared with prior exam. 4. Large right pleural effusion with complete collapse  of the right lower lobe, pleural effusion is decreased in size when compared with prior exam. 5. Stable low-attenuation liver lesions which are likely simple cysts. 6. Lytic and sclerotic osseous lesions again seen, many of which demonstrate increased sclerosis, likely due to treatment response. 7. Pathologic compression fracture of T3 demonstrates increased height loss, although there is no evidence of significant retropulsion. 8. Large stool ball in the rectum, correlate for constipation.     08/02/2022 - 08/30/2022 Hospital Admission   She was admitted to the hospital due to malignant pleural effusion and was diagnosed with MSSA empyema   08/10/2022 Imaging   1. Large loculated right hydropneumothorax encompassing about 75% of right hemithoracic volume. Pigtail right pleural drainage catheter in place. 2. Moderate to large left pleural effusion encompassing about 1/3 of the left hemithoracic volume. 3. Right axillary mass or lymph node measures 1.8 cm in short axis, formerly 1.9 cm. Other previously seen chest  wall soft tissue lesions are less conspicuous on today's exam due to lack of IV contrast, but likely still present. 4. Bony destructive findings of the right fifth rib laterally compatible with metastatic disease. 5. Extensive sclerosis in the sternum favoring metastatic disease over infection. 6. Nearly complete central collapse of the T3 vertebral body with associated sclerosis and minimal posterior bony retropulsion, unchanged from prior. 7. Diffuse hazy edema/infiltration of the mediastinal and subcutaneous adipose tissues compatible with third spacing of fluid. 8. Aortic atherosclerosis.     01/13/2023 Imaging   CT CHEST ABDOMEN PELVIS W CONTRAST  Result Date: 01/12/2023 CLINICAL DATA:  Stage IV invasive breast cancer. Ongoing chemotherapy. Assess treatment response. * Tracking Code: BO * EXAM: CT CHEST, ABDOMEN, AND PELVIS WITH CONTRAST TECHNIQUE: Multidetector CT imaging of the chest, abdomen and pelvis was performed following the standard protocol during bolus administration of intravenous contrast. RADIATION DOSE REDUCTION: This exam was performed according to the departmental dose-optimization program which includes automated exposure control, adjustment of the mA and/or kV according to patient size and/or use of iterative reconstruction technique. CONTRAST:  OMNIPAQUE IOHEXOL 300 MG/ML  SOLN COMPARISON:  CT chest 08/10/2022. Chest abdomen pelvis CT 06/25/2022. Numerous prior x-rays FINDINGS: CT CHEST FINDINGS Cardiovascular: Right side of the heart is slightly enlarged. No pericardial effusion the thoracic aorta has a normal course and caliber. Mild vascular calcifications. Mediastinum/Nodes: Slightly patulous thoracic esophagus. Preserved thyroid gland. No abnormal lymph node enlargement seen in the left axillary region, left chest wall, left axillary region or mediastinum small right hilar node on image 33 of series three, nonpathologic by size criteria slightly more prominent than  usually seen. This was not as well seen on the prior CT scan of November 2023 but the study was without contrast. On the older study with contrast from 06/25/2022 this node was present and stable. Enlarged right axillary lymph nodes identified. On the study of November 2023 a node was measured at 18 by 28 mm and today this same node measures 2.7 x 1.5 cm on series 3, image 29. Lungs/Pleura: Left basilar atelectasis. No pneumothorax or effusion. No dominant left-sided lung nodule. There is some volume loss along the right hemithorax with a small effusion and pleural thickening. Small loculated pleural effusion at the right lateral lung base as well such as series 3, image 47 measuring 7.2 by 2.6 cm. Adjacent lung opacity. There is a nodule in the right lower lobe which is noncalcified on series 7, image 74 measuring 5 mm. This area was obscured by a more significant opacity  on the prior CT scans of the larger effusion and pleural drain. Attention on follow-up. Is also a spiculated nodule right upper lobe today on series 7, image 32 measuring 7 by 4 mm. In retrospect this nodule was present in November 2023 measuring 6 3 mm. In September 2023 this measured 4 mm. Slowly increasing in size. Musculoskeletal: Curvature of the spine with some degenerative changes. There is areas of sclerosis seen along several vertebral bodies, the sternum and ribs with some lytic areas consistent with bone metastases. There is severe compression of the T3 level which is unchanged from previous. The areas of sclerosis involving T4 level in the sternum appears similar. There is diffuse anasarca with several soft tissue nodules identified in the right chest wall including dermal, subdermal along muscular. Many these areas are significantly increased. Example soft tissue nodule anteriorly on series 3, image 34 extending out to the skin measures 19 by 19 mm today and previously small amount of thickening would have measured up to 5 mm in  thickness. Again several other areas are significantly increased. Previous right mastectomy. CT ABDOMEN PELVIS FINDINGS Hepatobiliary: Multiple hepatic cysts are again identified. Gallbladder is present. Patent portal vein. Pancreas: Unremarkable. No pancreatic ductal dilatation or surrounding inflammatory changes. Spleen: Normal in size without focal abnormality. Adrenals/Urinary Tract: The adrenal glands are preserved. Upper pole left-sided Bosniak 1 renal cyst again seen measuring 2.7 cm in diameter. Hounsfield unit of 5. No specific imaging follow-up. Tiny focus elsewhere as well in the mid left kidney. No imaging follow-up. These are unchanged from previous. Preserved contours of the collapsed urinary bladder. Stomach/Bowel: Bowel is nondilated. Diffuse colonic stool. Cecum resides in the right hemipelvis. Presumed normal retrocecal air-filled appendix. Vascular/Lymphatic: Normal caliber aorta and IVC. No discrete abnormal lymph node enlargement identified in the abdomen and pelvis. Reproductive: Uterus and bilateral adnexa are unremarkable. Other: Anasarca.  No free air or free fluid identified. Musculoskeletal: Curvature of the spine. Multifocal degenerative changes of the spine and pelvis there is also lytic and sclerotic bone lesions identified along the pelvis and lumbar spine. Distribution by CT appears similar to the previous. Schmorl's node compression deformity again seen at L4. IMPRESSION: Worsening disease with multiple areas of nodular skin thickening along the right hemithorax and chest wall with additional underlying subcutaneous fat and muscular nodules. Persistent axillary lymph nodes. Small right pleural effusion seen with a loculated component on the right side of the lung base. Adjacent lung opacities. Small right-sided lung nodules identified which were not as well seen on the prior, possibly due to the level of the lung opacity but are felt to be increasing slightly. Attention on short  follow-up. Mixed lucent and sclerotic bone lesions are again identified with stable compression of T3. Electronically Signed   By: Karen Kays M.D.   On: 01/12/2023 15:09        PHYSICAL EXAMINATION: ECOG PERFORMANCE STATUS: 1 - Symptomatic but completely ambulatory  Vitals:   02/26/23 1405  BP: (!) 152/87  Pulse: (!) 120  Resp: 18  Temp: 98 F (36.7 C)  SpO2: 97%   Filed Weights   02/26/23 1405  Weight: 137 lb 3.2 oz (62.2 kg)    GENERAL:alert, no distress and comfortable SKIN: Diffuse skin lesions on her chest wall.  At the bottom half of her right chest, I see improvement.  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  Some of the skin lesions have signs of scab, others at various  stages of healing with bleeding NEURO: alert & oriented x 3 with fluent speech, no focal motor/sensory deficits  LABORATORY DATA:  I have reviewed the data as listed    Component Value Date/Time   NA 141 02/26/2023 1356   K 4.5 02/26/2023 1356   CL 106 02/26/2023 1356   CO2 29 02/26/2023 1356   GLUCOSE 111 (H) 02/26/2023 1356   BUN 14 02/26/2023 1356   CREATININE 1.02 (H) 02/26/2023 1356   CREATININE 1.03 (H) 11/28/2022 1242   CALCIUM 8.7 (L) 02/26/2023 1356   PROT 7.3 02/26/2023 1356   ALBUMIN 3.9 02/26/2023 1356   AST 24 02/26/2023 1356   AST 22 11/28/2022 1242   ALT 10 02/26/2023 1356   ALT 13 11/28/2022 1242   ALKPHOS 50 02/26/2023 1356   BILITOT 0.5 02/26/2023 1356   BILITOT 0.5 11/28/2022 1242   GFRNONAA >60 02/26/2023 1356   GFRNONAA >60 11/28/2022 1242    No results found for: "SPEP", "UPEP"  Lab Results  Component Value Date   WBC 3.8 (L) 02/26/2023   NEUTROABS 2.3 02/26/2023   HGB 10.9 (L) 02/26/2023   HCT 33.8 (L) 02/26/2023   MCV 85.6 02/26/2023   PLT 329 02/26/2023      Chemistry      Component Value Date/Time   NA 141 02/26/2023 1356   K 4.5 02/26/2023 1356   CL 106 02/26/2023 1356   CO2 29  02/26/2023 1356   BUN 14 02/26/2023 1356   CREATININE 1.02 (H) 02/26/2023 1356   CREATININE 1.03 (H) 11/28/2022 1242      Component Value Date/Time   CALCIUM 8.7 (L) 02/26/2023 1356   ALKPHOS 50 02/26/2023 1356   AST 24 02/26/2023 1356   AST 22 11/28/2022 1242   ALT 10 02/26/2023 1356   ALT 13 11/28/2022 1242   BILITOT 0.5 02/26/2023 1356   BILITOT 0.5 11/28/2022 1242

## 2023-02-26 NOTE — Assessment & Plan Note (Signed)
I reexamined her wound today There is minimum wound healing We addressed nutritional intake today 

## 2023-02-27 ENCOUNTER — Encounter: Payer: Self-pay | Admitting: Hematology and Oncology

## 2023-02-27 NOTE — Telephone Encounter (Signed)
Called and given appt date for CT at [facility] with address. Appt 6/13 at 1 pm, arrive at 1245 for appt. NPO 4 hours prior to exam. She verbalized understanding. Mailed appt details to her listed address per her request.

## 2023-03-03 ENCOUNTER — Other Ambulatory Visit (HOSPITAL_BASED_OUTPATIENT_CLINIC_OR_DEPARTMENT_OTHER): Payer: Self-pay

## 2023-03-12 ENCOUNTER — Ambulatory Visit (HOSPITAL_BASED_OUTPATIENT_CLINIC_OR_DEPARTMENT_OTHER): Payer: BC Managed Care – PPO

## 2023-03-13 ENCOUNTER — Telehealth: Payer: Self-pay

## 2023-03-13 NOTE — Telephone Encounter (Signed)
Returned her call. She missed CT appt yesterday due to diarrhea. CT rescheduled to 6/17 at 6 pm. Moved 6/18 appts to 6/20 at the same times. Lexiss is aware of appts.

## 2023-03-16 ENCOUNTER — Ambulatory Visit (HOSPITAL_BASED_OUTPATIENT_CLINIC_OR_DEPARTMENT_OTHER): Admission: RE | Admit: 2023-03-16 | Payer: BC Managed Care – PPO | Source: Ambulatory Visit

## 2023-03-16 DIAGNOSIS — C50911 Malignant neoplasm of unspecified site of right female breast: Secondary | ICD-10-CM | POA: Insufficient documentation

## 2023-03-16 MED ORDER — IOHEXOL 300 MG/ML  SOLN
100.0000 mL | Freq: Once | INTRAMUSCULAR | Status: AC | PRN
  Administered 2023-03-16: 100 mL via INTRAVENOUS

## 2023-03-17 ENCOUNTER — Ambulatory Visit: Payer: BC Managed Care – PPO | Admitting: Hematology and Oncology

## 2023-03-17 ENCOUNTER — Inpatient Hospital Stay: Payer: BC Managed Care – PPO

## 2023-03-17 ENCOUNTER — Inpatient Hospital Stay: Payer: BC Managed Care – PPO | Admitting: Hematology and Oncology

## 2023-03-19 ENCOUNTER — Other Ambulatory Visit: Payer: Self-pay | Admitting: Hematology and Oncology

## 2023-03-19 ENCOUNTER — Encounter: Payer: Self-pay | Admitting: Hematology and Oncology

## 2023-03-19 ENCOUNTER — Other Ambulatory Visit: Payer: Self-pay

## 2023-03-19 ENCOUNTER — Telehealth: Payer: Self-pay | Admitting: Pharmacy Technician

## 2023-03-19 ENCOUNTER — Inpatient Hospital Stay: Payer: BC Managed Care – PPO | Admitting: Hematology and Oncology

## 2023-03-19 ENCOUNTER — Other Ambulatory Visit (HOSPITAL_COMMUNITY): Payer: Self-pay

## 2023-03-19 ENCOUNTER — Telehealth: Payer: Self-pay

## 2023-03-19 ENCOUNTER — Inpatient Hospital Stay: Payer: BC Managed Care – PPO

## 2023-03-19 ENCOUNTER — Inpatient Hospital Stay: Payer: BC Managed Care – PPO | Attending: Hematology and Oncology

## 2023-03-19 VITALS — BP 136/81 | HR 125 | Resp 18 | Ht 66.0 in | Wt 132.2 lb

## 2023-03-19 DIAGNOSIS — G893 Neoplasm related pain (acute) (chronic): Secondary | ICD-10-CM | POA: Diagnosis not present

## 2023-03-19 DIAGNOSIS — Z9011 Acquired absence of right breast and nipple: Secondary | ICD-10-CM | POA: Insufficient documentation

## 2023-03-19 DIAGNOSIS — Z170 Estrogen receptor positive status [ER+]: Secondary | ICD-10-CM | POA: Diagnosis not present

## 2023-03-19 DIAGNOSIS — C50911 Malignant neoplasm of unspecified site of right female breast: Secondary | ICD-10-CM

## 2023-03-19 DIAGNOSIS — C7951 Secondary malignant neoplasm of bone: Secondary | ICD-10-CM | POA: Diagnosis not present

## 2023-03-19 DIAGNOSIS — K5909 Other constipation: Secondary | ICD-10-CM

## 2023-03-19 DIAGNOSIS — R634 Abnormal weight loss: Secondary | ICD-10-CM | POA: Diagnosis not present

## 2023-03-19 DIAGNOSIS — Z79899 Other long term (current) drug therapy: Secondary | ICD-10-CM | POA: Insufficient documentation

## 2023-03-19 LAB — CBC WITH DIFFERENTIAL/PLATELET
Abs Immature Granulocytes: 0.02 10*3/uL (ref 0.00–0.07)
Basophils Absolute: 0.1 10*3/uL (ref 0.0–0.1)
Basophils Relative: 2 %
Eosinophils Absolute: 0 10*3/uL (ref 0.0–0.5)
Eosinophils Relative: 0 %
HCT: 29.9 % — ABNORMAL LOW (ref 36.0–46.0)
Hemoglobin: 9.7 g/dL — ABNORMAL LOW (ref 12.0–15.0)
Immature Granulocytes: 0 %
Lymphocytes Relative: 25 %
Lymphs Abs: 1.2 10*3/uL (ref 0.7–4.0)
MCH: 27.5 pg (ref 26.0–34.0)
MCHC: 32.4 g/dL (ref 30.0–36.0)
MCV: 84.7 fL (ref 80.0–100.0)
Monocytes Absolute: 0.3 10*3/uL (ref 0.1–1.0)
Monocytes Relative: 7 %
Neutro Abs: 3.3 10*3/uL (ref 1.7–7.7)
Neutrophils Relative %: 66 %
Platelets: 454 10*3/uL — ABNORMAL HIGH (ref 150–400)
RBC: 3.53 MIL/uL — ABNORMAL LOW (ref 3.87–5.11)
RDW: 15.7 % — ABNORMAL HIGH (ref 11.5–15.5)
WBC: 5 10*3/uL (ref 4.0–10.5)
nRBC: 0 % (ref 0.0–0.2)

## 2023-03-19 LAB — COMPREHENSIVE METABOLIC PANEL
ALT: 9 U/L (ref 0–44)
AST: 27 U/L (ref 15–41)
Albumin: 3.3 g/dL — ABNORMAL LOW (ref 3.5–5.0)
Alkaline Phosphatase: 52 U/L (ref 38–126)
Anion gap: 6 (ref 5–15)
BUN: 12 mg/dL (ref 8–23)
CO2: 30 mmol/L (ref 22–32)
Calcium: 8.9 mg/dL (ref 8.9–10.3)
Chloride: 103 mmol/L (ref 98–111)
Creatinine, Ser: 0.94 mg/dL (ref 0.44–1.00)
GFR, Estimated: >60 mL/min (ref >60)
Glucose, Bld: 111 mg/dL — ABNORMAL HIGH (ref 70–99)
Potassium: 3.6 mmol/L (ref 3.5–5.1)
Sodium: 139 mmol/L (ref 135–145)
Total Bilirubin: 0.4 mg/dL (ref 0.3–1.2)
Total Protein: 6.4 g/dL — ABNORMAL LOW (ref 6.5–8.1)

## 2023-03-19 MED ORDER — HYDROMORPHONE HCL 8 MG PO TABS
8.0000 mg | ORAL_TABLET | Freq: Four times a day (QID) | ORAL | 0 refills | Status: DC | PRN
  Filled 2023-03-19: qty 90, 23d supply, fill #0

## 2023-03-19 MED ORDER — CAPECITABINE 500 MG PO TABS
500.0000 mg | ORAL_TABLET | Freq: Two times a day (BID) | ORAL | 11 refills | Status: DC
  Filled 2023-03-19: qty 60, 30d supply, fill #0

## 2023-03-19 NOTE — Telephone Encounter (Signed)
Oral Oncology Patient Advocate Encounter   Received notification that prior authorization for capecitabine is required.   PA submitted on 03/19/23 Key [id number] Status is pending     Jinger Neighbors, CPhT-Adv Oncology Pharmacy Patient Advocate [facility] Direct Number: [phone]  Fax: [phone]

## 2023-03-19 NOTE — Telephone Encounter (Signed)
Oral Oncology Patient Advocate Encounter  Prior Authorization for capecitabine has been approved.    PA# [id number] Effective dates: 03/19/23 through 03/18/24  Patients co-pay is $0.    Jinger Neighbors, CPhT-Adv Oncology Pharmacy Patient Advocate [facility] Direct Number: [phone]  Fax: [phone]

## 2023-03-19 NOTE — Assessment & Plan Note (Signed)
Unfortunately, clinical exam and imaging study confirmed disease progression We will discontinue Faslodex and Ibrance We discussed risk and benefits of switching her to chemotherapy The patient prefer oral agent but I am concerned about compliance Her mother has agreed to monitor for compliance We discussed risk, benefits, side effects of Xeloda and she is in agreement Given her progressive weight loss, I will start her on 500 mg twice daily and assess for toxicity If she tolerates that well, I can increase the dose gradually over the next few months

## 2023-03-19 NOTE — Assessment & Plan Note (Addendum)
She brought with her pill bottles from January Even though the patient stated she is taking all her medications as prescribed, she has a lot of leftover Dilaudid (previous dose at 4mg ) It is not clear whether she is taking her medications correctly Her mother will help monitor medication compliance while at home I refilled her prescription Dilaudid today

## 2023-03-19 NOTE — Assessment & Plan Note (Signed)
She is profoundly constipated We have extensive discussions in the past about chronic laxatives I reinforced the importance of taking MiraLAX and Senokot together Will call her tomorrow to see if she would have bowel movement

## 2023-03-19 NOTE — Progress Notes (Signed)
[facility] OFFICE PROGRESS NOTE  Patient Care Team: Artis Delay, MD as PCP - General (Hematology and Oncology)  ASSESSMENT & PLAN:  Breast cancer, stage 4, right [facility]) Unfortunately, clinical exam and imaging study confirmed disease progression We will discontinue Faslodex and Ibrance We discussed risk and benefits of switching her to chemotherapy The patient prefer oral agent but I am concerned about compliance Her mother has agreed to monitor for compliance We discussed risk, benefits, side effects of Xeloda and she is in agreement Given her progressive weight loss, I will start her on 500 mg twice daily and assess for toxicity If she tolerates that well, I can increase the dose gradually over the next few months  Breast cancer metastasized to bone, right (HCC) She is reminded to take vitamin D and calcium She has obtained dental clearance She will continue Xgeva every 3 months, next due in July  Weight loss, non-intentional Multifactorial, a component of noncompliance and active cancer She will continue frequent small meals as tolerated  Cancer associated pain She brought with her pill bottles from January Even though the patient stated she is taking all her medications as prescribed, she has a lot of leftover Dilaudid (previous dose at 4mg ) It is not clear whether she is taking her medications correctly Her mother will help monitor medication compliance while at home I refilled her prescription Dilaudid today  Other constipation She is profoundly constipated We have extensive discussions in the past about chronic laxatives I reinforced the importance of taking MiraLAX and Senokot together Will call her tomorrow to see if she would have bowel movement  No orders of the defined types were placed in this encounter.   All questions were answered. The patient knows to call the clinic with any problems, questions or concerns. The total time spent in the  appointment was 40 minutes encounter with patients including review of chart and various tests results, discussions about plan of care and coordination of care plan   Artis Delay, MD 03/19/2023 3:28 PM  INTERVAL HISTORY: Please see below for problem oriented charting. she returns for treatment follow-up with her mother The patient is profoundly constipated for several days She brought with her an old pill bottle which indicated she has left lower pain medicine despite telling me in the past that she is taking all her medications as prescribed She denies nausea She has lost weight I reviewed imaging study results and we discussed next plan of care  REVIEW OF SYSTEMS:   Constitutional: Denies fevers, chills  Eyes: Denies blurriness of vision Ears, nose, mouth, throat, and face: Denies mucositis or sore throat Respiratory: Denies cough, dyspnea or wheezes Cardiovascular: Denies palpitation, chest discomfort or lower extremity swelling Skin: Denies abnormal skin rashes Lymphatics: Denies new lymphadenopathy or easy bruising Neurological:Denies numbness, tingling or new weaknesses Behavioral/Psych: Mood is stable, no new changes  All other systems were reviewed with the patient and are negative.  I have reviewed the past medical history, past surgical history, social history and family history with the patient and they are unchanged from previous note.  ALLERGIES:  has No Known Allergies.  MEDICATIONS:  Current Outpatient Medications  Medication Sig Dispense Refill   capecitabine (XELODA) 500 MG tablet Take 1 tablet (500 mg total) by mouth 2 (two) times daily after a meal. 60 tablet 11   calcium carbonate (TUMS - DOSED IN MG ELEMENTAL CALCIUM) 500 MG chewable tablet Chew 1 tablet by mouth 2 (two) times daily.  cholecalciferol (VITAMIN D3) 25 MCG (1000 UNIT) tablet Take 2,000 Units by mouth daily.     famotidine (PEPCID) 20 MG tablet Take 1 tablet (20 mg total) by mouth 2 (two) times  daily. 60 tablet 2   HYDROmorphone (DILAUDID) 8 MG tablet Take 1 tablet (8 mg total) by mouth every 6 (six) hours as needed for moderate pain. 90 tablet 0   magnesium oxide (MAG-OX) 400 (240 Mg) MG tablet Take 1 tablet (400 mg total) by mouth daily. 30 tablet 3   metoprolol tartrate (LOPRESSOR) 25 MG tablet Take 1 tablet (25 mg total) by mouth 2 (two) times daily. 60 tablet 0   metroNIDAZOLE (METROGEL) 1 % gel Apply topically in the morning and at bedtime. Apply topical 2 x day for 2 weeks 60 g 0   mirtazapine (REMERON) 7.5 MG tablet Take 1 tablet (7.5 mg total) by mouth at bedtime. 30 tablet 1   morphine (MS CONTIN) 30 MG 12 hr tablet Take 1 tablet (30 mg total) by mouth every 12 (twelve) hours. 60 tablet 0   polyethylene glycol (MIRALAX / GLYCOLAX) 17 g packet Take 17 g by mouth 2 (two) times daily.     senna (SENOKOT) 8.6 MG tablet Take 2 tablets by mouth 2 (two) times daily.     No current facility-administered medications for this visit.    SUMMARY OF ONCOLOGIC HISTORY: Oncology History Overview Note  The tumor cells are EQUIVOCAL For Her2 (2+).   Estrogen Receptor:       POSITIVE, 100%, STRONG STAINING  Progesterone Receptor:   NEGATIVE (0)  Proliferation Marker Ki-67:   45%    Breast cancer, stage 4, right (HCC)  03/22/2022 Initial Diagnosis   Primary breast malignancy (HCC)   03/24/2022 Pathology Results   SURGICAL PATHOLOGY  CASE: [id number]  PATIENT: [name]  Surgical Pathology Report   Clinical History: Ulcerative right breast lesion and probable metastatic breast cancer ([facility])   FINAL MICROSCOPIC DIAGNOSIS:   A. SOFT TISSUE MASS, RIGHT UPPER CHEST WALL, NEEDLE CORE BIOPSY:  - Invasive ductal carcinoma, see comment  - Ductal carcinoma in situ: Not identified   - Tubule formation: Score 3  - Nuclear pleomorphism: Score 3  - Mitotic count: Score 3  - Total score: 9  - Overall Grade: 3  - Lymphovascular invasion: Not identified  - Cancer Length: 13 mm  -  Calcifications: Not identified  - Other findings: None    03/28/2022 Cancer Staging   Staging form: Breast, AJCC 8th Edition - Clinical stage from 03/28/2022: Stage IV (cT4d, cN3c, pM1, G3, ER+, PR-, HER2: Equivocal) - Signed by Artis Delay, MD on 03/28/2022 Stage prefix: Initial diagnosis Nuclear grade: G3 Histologic grading system: 3 grade system   04/17/2022 -  Chemotherapy   She started taking combination of abemaciclib with anastrozole   06/27/2022 Imaging   1. Ulcerated mass of the right breast with extension of nodular enhancing soft tissue to the right chest wall slight extension into the contralateral left breast, markedly decreased in size when compared with prior exam. 2. Interval decreased size of left axillary lymph node. 3. Solid pulmonary nodules are decreased in size when compared with prior exam. 4. Large right pleural effusion with complete collapse of the right lower lobe, pleural effusion is decreased in size when compared with prior exam. 5. Stable low-attenuation liver lesions which are likely simple cysts. 6. Lytic and sclerotic osseous lesions again seen, many of which demonstrate increased sclerosis, likely due to treatment response. 7. Pathologic  compression fracture of T3 demonstrates increased height loss, although there is no evidence of significant retropulsion. 8. Large stool ball in the rectum, correlate for constipation.     08/02/2022 - 08/30/2022 Hospital Admission   She was admitted to the hospital due to malignant pleural effusion and was diagnosed with MSSA empyema   08/10/2022 Imaging   1. Large loculated right hydropneumothorax encompassing about 75% of right hemithoracic volume. Pigtail right pleural drainage catheter in place. 2. Moderate to large left pleural effusion encompassing about 1/3 of the left hemithoracic volume. 3. Right axillary mass or lymph node measures 1.8 cm in short axis, formerly 1.9 cm. Other previously seen chest wall soft tissue  lesions are less conspicuous on today's exam due to lack of IV contrast, but likely still present. 4. Bony destructive findings of the right fifth rib laterally compatible with metastatic disease. 5. Extensive sclerosis in the sternum favoring metastatic disease over infection. 6. Nearly complete central collapse of the T3 vertebral body with associated sclerosis and minimal posterior bony retropulsion, unchanged from prior. 7. Diffuse hazy edema/infiltration of the mediastinal and subcutaneous adipose tissues compatible with third spacing of fluid. 8. Aortic atherosclerosis.     01/13/2023 Imaging   CT CHEST ABDOMEN PELVIS W CONTRAST  Result Date: 01/12/2023 CLINICAL DATA:  Stage IV invasive breast cancer. Ongoing chemotherapy. Assess treatment response. * Tracking Code: BO * EXAM: CT CHEST, ABDOMEN, AND PELVIS WITH CONTRAST TECHNIQUE: Multidetector CT imaging of the chest, abdomen and pelvis was performed following the standard protocol during bolus administration of intravenous contrast. RADIATION DOSE REDUCTION: This exam was performed according to the departmental dose-optimization program which includes automated exposure control, adjustment of the mA and/or kV according to patient size and/or use of iterative reconstruction technique. CONTRAST:  OMNIPAQUE IOHEXOL 300 MG/ML  SOLN COMPARISON:  CT chest 08/10/2022. Chest abdomen pelvis CT 06/25/2022. Numerous prior x-rays FINDINGS: CT CHEST FINDINGS Cardiovascular: Right side of the heart is slightly enlarged. No pericardial effusion the thoracic aorta has a normal course and caliber. Mild vascular calcifications. Mediastinum/Nodes: Slightly patulous thoracic esophagus. Preserved thyroid gland. No abnormal lymph node enlargement seen in the left axillary region, left chest wall, left axillary region or mediastinum small right hilar node on image 33 of series three, nonpathologic by size criteria slightly more prominent than usually seen. This was  not as well seen on the prior CT scan of November 2023 but the study was without contrast. On the older study with contrast from 06/25/2022 this node was present and stable. Enlarged right axillary lymph nodes identified. On the study of November 2023 a node was measured at 18 by 28 mm and today this same node measures 2.7 x 1.5 cm on series 3, image 29. Lungs/Pleura: Left basilar atelectasis. No pneumothorax or effusion. No dominant left-sided lung nodule. There is some volume loss along the right hemithorax with a small effusion and pleural thickening. Small loculated pleural effusion at the right lateral lung base as well such as series 3, image 47 measuring 7.2 by 2.6 cm. Adjacent lung opacity. There is a nodule in the right lower lobe which is noncalcified on series 7, image 74 measuring 5 mm. This area was obscured by a more significant opacity on the prior CT scans of the larger effusion and pleural drain. Attention on follow-up. Is also a spiculated nodule right upper lobe today on series 7, image 32 measuring 7 by 4 mm. In retrospect this nodule was present in November 2023 measuring 6 3 mm.  In September 2023 this measured 4 mm. Slowly increasing in size. Musculoskeletal: Curvature of the spine with some degenerative changes. There is areas of sclerosis seen along several vertebral bodies, the sternum and ribs with some lytic areas consistent with bone metastases. There is severe compression of the T3 level which is unchanged from previous. The areas of sclerosis involving T4 level in the sternum appears similar. There is diffuse anasarca with several soft tissue nodules identified in the right chest wall including dermal, subdermal along muscular. Many these areas are significantly increased. Example soft tissue nodule anteriorly on series 3, image 34 extending out to the skin measures 19 by 19 mm today and previously small amount of thickening would have measured up to 5 mm in thickness. Again several  other areas are significantly increased. Previous right mastectomy. CT ABDOMEN PELVIS FINDINGS Hepatobiliary: Multiple hepatic cysts are again identified. Gallbladder is present. Patent portal vein. Pancreas: Unremarkable. No pancreatic ductal dilatation or surrounding inflammatory changes. Spleen: Normal in size without focal abnormality. Adrenals/Urinary Tract: The adrenal glands are preserved. Upper pole left-sided Bosniak 1 renal cyst again seen measuring 2.7 cm in diameter. Hounsfield unit of 5. No specific imaging follow-up. Tiny focus elsewhere as well in the mid left kidney. No imaging follow-up. These are unchanged from previous. Preserved contours of the collapsed urinary bladder. Stomach/Bowel: Bowel is nondilated. Diffuse colonic stool. Cecum resides in the right hemipelvis. Presumed normal retrocecal air-filled appendix. Vascular/Lymphatic: Normal caliber aorta and IVC. No discrete abnormal lymph node enlargement identified in the abdomen and pelvis. Reproductive: Uterus and bilateral adnexa are unremarkable. Other: Anasarca.  No free air or free fluid identified. Musculoskeletal: Curvature of the spine. Multifocal degenerative changes of the spine and pelvis there is also lytic and sclerotic bone lesions identified along the pelvis and lumbar spine. Distribution by CT appears similar to the previous. Schmorl's node compression deformity again seen at L4. IMPRESSION: Worsening disease with multiple areas of nodular skin thickening along the right hemithorax and chest wall with additional underlying subcutaneous fat and muscular nodules. Persistent axillary lymph nodes. Small right pleural effusion seen with a loculated component on the right side of the lung base. Adjacent lung opacities. Small right-sided lung nodules identified which were not as well seen on the prior, possibly due to the level of the lung opacity but are felt to be increasing slightly. Attention on short follow-up. Mixed lucent and  sclerotic bone lesions are again identified with stable compression of T3. Electronically Signed   By: Karen Kays M.D.   On: 01/12/2023 15:09      03/19/2023 Imaging   CT CHEST ABDOMEN PELVIS W CONTRAST  Result Date: 03/19/2023 CLINICAL DATA:  Stage IV breast cancer. Restaging. * Tracking Code: BO * EXAM: CT CHEST, ABDOMEN, AND PELVIS WITH CONTRAST TECHNIQUE: Multidetector CT imaging of the chest, abdomen and pelvis was performed following the standard protocol during bolus administration of intravenous contrast. RADIATION DOSE REDUCTION: This exam was performed according to the departmental dose-optimization program which includes automated exposure control, adjustment of the mA and/or kV according to patient size and/or use of iterative reconstruction technique. CONTRAST:  OMNIPAQUE IOHEXOL 300 MG/ML  SOLN COMPARISON:  01/09/2023 FINDINGS: CT CHEST FINDINGS Cardiovascular: The heart size is upper normal to borderline enlarged. No substantial pericardial effusion. Mediastinum/Nodes: No mediastinal lymphadenopathy. Small hilar lymph nodes evident bilaterally. The esophagus has normal imaging features. No left axillary lymphadenopathy. Lungs/Pleura: Volume loss with architectural distortion and pleuroparenchymal scarring in the inferior right lung is similar to  prior. 7 mm right apical nodule identified previously is unchanged on 30/4 today. 5 mm nodule in the medial right base identified previously is stable on 76/4. Interval development of masslike consolidative opacity in the posterior left lower lobe (120/4) suspicious for pneumonia given lack of appreciable volume loss in the left lower lobe. Right pleural effusion is similar to prior although measured loculated component on the previous study is 7.2 x 2.6 cm is now 5.9 x 1.7 cm on 46/3. Musculoskeletal: The numerous right-sided predominant chest wall lesions are progressive in the interval. Index anterior right paramidline lesion measured  previously at 1.9 x 1.9 cm is 3.1 x 2.5 cm today on image 31/3. Skin thickening lateral lower chest wall is 8 mm today on image 52/3 which corresponds to 4 mm when measuring at the same level on the prior study. Chest wall edema noted right greater than left. Sclerotic lesion in the anterior right eighth rib is similar to prior. Marked compression deformity at T3 again noted. Sclerotic sternal lesion is similar to prior. CT ABDOMEN PELVIS FINDINGS Hepatobiliary: Multiple hepatic cysts are again noted. Capsular irregularity along the hemidiaphragm is similar to prior. There is no evidence for gallstones, gallbladder wall thickening, or pericholecystic fluid. No intrahepatic or extrahepatic biliary dilation. Pancreas: Interval development of diffuse distention of the main pancreatic duct measuring up to 4 mm diameter. No discernible obstructing mass lesion in the head of the pancreas. Spleen: No splenomegaly. No suspicious focal mass lesion. Adrenals/Urinary Tract: No adrenal nodule or mass. Opacification of the bilateral renal calices consistent with early excretion of contrast material. Small cyst noted upper pole left kidney, as before. No followup imaging is recommended. No evidence for hydroureter. The urinary bladder appears normal for the degree of distention. Stomach/Bowel: Stomach is moderately distended with fluid. Mild diffuse distention of small bowel and colon noted with moderate to large stool volume evident. Vascular/Lymphatic: No abdominal aortic aneurysm. There is no gastrohepatic or hepatoduodenal ligament lymphadenopathy. No retroperitoneal or mesenteric lymphadenopathy. No pelvic sidewall lymphadenopathy. 10 mm short axis right groin node on 125/3 is upper normal for size. Reproductive: Unremarkable. Other: No substantial intraperitoneal free fluid. Musculoskeletal: Similar appearance of the lesion in the right pubic bone. Lucent lesion in the left L4 vertebral body is similar to prior. IMPRESSION:  1. Interval progression of the numerous right-sided predominant chest wall lesions. 2. Interval development of masslike consolidative opacity in the posterior left lower lobe suspicious for pneumonia given lack of appreciable volume loss in the left lower lobe. 3. No interval change in index right-sided pulmonary nodules. Continued attention on follow-up suggested. 4. Interval development of diffuse distention of the main pancreatic duct measuring up to 4 mm diameter. No discernible obstructing mass lesion in the head of the pancreas. Close attention on follow-up recommended. 5. Similar appearance of the osseous metastatic disease. 6. No substantial change in right pleural effusion although measured loculated component on the previous study has decreased in the interval. 7. Mild diffuse distention of small bowel and colon with moderate to large stool volume. Correlation for signs/symptoms of clinical constipation suggested. Electronically Signed   By: Kennith Center M.D.   On: 03/19/2023 08:27        PHYSICAL EXAMINATION: ECOG PERFORMANCE STATUS: 2 - Symptomatic, <50% confined to bed  Vitals:   03/19/23 1434  BP: 136/81  Pulse: (!) 125  Resp: 18  SpO2: 98%   Filed Weights   03/19/23 1434  Weight: 132 lb 3.2 oz (60 kg)  GENERAL:alert, no distress and comfortable.  She looks thin and cachectic NEURO: alert & oriented x 3 with fluent speech, no focal motor/sensory deficits  LABORATORY DATA:  I have reviewed the data as listed    Component Value Date/Time   NA 139 03/19/2023 1416   K 3.6 03/19/2023 1416   CL 103 03/19/2023 1416   CO2 30 03/19/2023 1416   GLUCOSE 111 (H) 03/19/2023 1416   BUN 12 03/19/2023 1416   CREATININE 0.94 03/19/2023 1416   CREATININE 1.03 (H) 11/28/2022 1242   CALCIUM 8.9 03/19/2023 1416   PROT 6.4 (L) 03/19/2023 1416   ALBUMIN 3.3 (L) 03/19/2023 1416   AST 27 03/19/2023 1416   AST 22 11/28/2022 1242   ALT 9 03/19/2023 1416   ALT 13 11/28/2022 1242    ALKPHOS 52 03/19/2023 1416   BILITOT 0.4 03/19/2023 1416   BILITOT 0.5 11/28/2022 1242   GFRNONAA >60 03/19/2023 1416   GFRNONAA >60 11/28/2022 1242    No results found for: "SPEP", "UPEP"  Lab Results  Component Value Date   WBC 5.0 03/19/2023   NEUTROABS 3.3 03/19/2023   HGB 9.7 (L) 03/19/2023   HCT 29.9 (L) 03/19/2023   MCV 84.7 03/19/2023   PLT 454 (H) 03/19/2023      Chemistry      Component Value Date/Time   NA 139 03/19/2023 1416   K 3.6 03/19/2023 1416   CL 103 03/19/2023 1416   CO2 30 03/19/2023 1416   BUN 12 03/19/2023 1416   CREATININE 0.94 03/19/2023 1416   CREATININE 1.03 (H) 11/28/2022 1242      Component Value Date/Time   CALCIUM 8.9 03/19/2023 1416   ALKPHOS 52 03/19/2023 1416   AST 27 03/19/2023 1416   AST 22 11/28/2022 1242   ALT 9 03/19/2023 1416   ALT 13 11/28/2022 1242   BILITOT 0.4 03/19/2023 1416   BILITOT 0.5 11/28/2022 1242       RADIOGRAPHIC STUDIES: I have personally reviewed the radiological images as listed and agreed with the findings in the report. CT CHEST ABDOMEN PELVIS W CONTRAST  Result Date: 03/19/2023 CLINICAL DATA:  Stage IV breast cancer. Restaging. * Tracking Code: BO * EXAM: CT CHEST, ABDOMEN, AND PELVIS WITH CONTRAST TECHNIQUE: Multidetector CT imaging of the chest, abdomen and pelvis was performed following the standard protocol during bolus administration of intravenous contrast. RADIATION DOSE REDUCTION: This exam was performed according to the departmental dose-optimization program which includes automated exposure control, adjustment of the mA and/or kV according to patient size and/or use of iterative reconstruction technique. CONTRAST:  OMNIPAQUE IOHEXOL 300 MG/ML  SOLN COMPARISON:  01/09/2023 FINDINGS: CT CHEST FINDINGS Cardiovascular: The heart size is upper normal to borderline enlarged. No substantial pericardial effusion. Mediastinum/Nodes: No mediastinal lymphadenopathy. Small hilar lymph nodes evident  bilaterally. The esophagus has normal imaging features. No left axillary lymphadenopathy. Lungs/Pleura: Volume loss with architectural distortion and pleuroparenchymal scarring in the inferior right lung is similar to prior. 7 mm right apical nodule identified previously is unchanged on 30/4 today. 5 mm nodule in the medial right base identified previously is stable on 76/4. Interval development of masslike consolidative opacity in the posterior left lower lobe (120/4) suspicious for pneumonia given lack of appreciable volume loss in the left lower lobe. Right pleural effusion is similar to prior although measured loculated component on the previous study is 7.2 x 2.6 cm is now 5.9 x 1.7 cm on 46/3. Musculoskeletal: The numerous right-sided predominant chest wall lesions are progressive  in the interval. Index anterior right paramidline lesion measured previously at 1.9 x 1.9 cm is 3.1 x 2.5 cm today on image 31/3. Skin thickening lateral lower chest wall is 8 mm today on image 52/3 which corresponds to 4 mm when measuring at the same level on the prior study. Chest wall edema noted right greater than left. Sclerotic lesion in the anterior right eighth rib is similar to prior. Marked compression deformity at T3 again noted. Sclerotic sternal lesion is similar to prior. CT ABDOMEN PELVIS FINDINGS Hepatobiliary: Multiple hepatic cysts are again noted. Capsular irregularity along the hemidiaphragm is similar to prior. There is no evidence for gallstones, gallbladder wall thickening, or pericholecystic fluid. No intrahepatic or extrahepatic biliary dilation. Pancreas: Interval development of diffuse distention of the main pancreatic duct measuring up to 4 mm diameter. No discernible obstructing mass lesion in the head of the pancreas. Spleen: No splenomegaly. No suspicious focal mass lesion. Adrenals/Urinary Tract: No adrenal nodule or mass. Opacification of the bilateral renal calices consistent with early excretion of  contrast material. Small cyst noted upper pole left kidney, as before. No followup imaging is recommended. No evidence for hydroureter. The urinary bladder appears normal for the degree of distention. Stomach/Bowel: Stomach is moderately distended with fluid. Mild diffuse distention of small bowel and colon noted with moderate to large stool volume evident. Vascular/Lymphatic: No abdominal aortic aneurysm. There is no gastrohepatic or hepatoduodenal ligament lymphadenopathy. No retroperitoneal or mesenteric lymphadenopathy. No pelvic sidewall lymphadenopathy. 10 mm short axis right groin node on 125/3 is upper normal for size. Reproductive: Unremarkable. Other: No substantial intraperitoneal free fluid. Musculoskeletal: Similar appearance of the lesion in the right pubic bone. Lucent lesion in the left L4 vertebral body is similar to prior. IMPRESSION: 1. Interval progression of the numerous right-sided predominant chest wall lesions. 2. Interval development of masslike consolidative opacity in the posterior left lower lobe suspicious for pneumonia given lack of appreciable volume loss in the left lower lobe. 3. No interval change in index right-sided pulmonary nodules. Continued attention on follow-up suggested. 4. Interval development of diffuse distention of the main pancreatic duct measuring up to 4 mm diameter. No discernible obstructing mass lesion in the head of the pancreas. Close attention on follow-up recommended. 5. Similar appearance of the osseous metastatic disease. 6. No substantial change in right pleural effusion although measured loculated component on the previous study has decreased in the interval. 7. Mild diffuse distention of small bowel and colon with moderate to large stool volume. Correlation for signs/symptoms of clinical constipation suggested. Electronically Signed   By: Kennith Center M.D.   On: 03/19/2023 08:27

## 2023-03-19 NOTE — Telephone Encounter (Signed)
Oral Oncology Pharmacist Encounter  Received new prescription for Xeloda (capecitabine) for the treatment of metastatic breast cancer, planned duration until disease progression or unacceptable toxicity.  Labs from 03/19/2023 assessed, no interventions needed. Dose per MD due to patients weight loss and MD will titrate up. Prescription dose and frequency assessed for appropriateness.   Current medication list in Epic reviewed, DDIs with Xeloda identified: - mag oxide and calcium carbonate: antacids may increase concentration of xeloda. No action required. - mirtazapine: the combination of mirtazapine and xeloda may increase qtc prolongation as both are qtc prolonging agents. No action needed at this time, MD notified.   Evaluated chart and no patient barriers to medication adherence noted.   Patient agreement for treatment documented in MD note on 03/19/2023.  Prescription has been e-scribed to the [facility] for benefits analysis and approval.  Oral Oncology Clinic will continue to follow for insurance authorization, copayment issues, initial counseling and start date.  Bethel Born, PharmD Hematology/Oncology Clinical Pharmacist [facility] [phone] 03/19/2023 4:02 PM

## 2023-03-19 NOTE — Assessment & Plan Note (Signed)
Multifactorial, a component of noncompliance and active cancer She will continue frequent small meals as tolerated

## 2023-03-19 NOTE — Assessment & Plan Note (Signed)
She is reminded to take vitamin D and calcium She has obtained dental clearance She will continue Xgeva every 3 months, next due in July

## 2023-03-20 ENCOUNTER — Other Ambulatory Visit: Payer: Self-pay

## 2023-03-20 ENCOUNTER — Other Ambulatory Visit (HOSPITAL_COMMUNITY): Payer: Self-pay

## 2023-03-20 ENCOUNTER — Telehealth: Payer: Self-pay

## 2023-03-20 MED ORDER — CAPECITABINE 500 MG PO TABS
500.0000 mg | ORAL_TABLET | Freq: Two times a day (BID) | ORAL | 11 refills | Status: DC
Start: 2023-03-20 — End: 2023-04-17
  Filled 2023-03-20: qty 28, 14d supply, fill #0
  Filled 2023-03-20 (×2): qty 28, 21d supply, fill #0
  Filled 2023-04-08 – 2023-04-09 (×2): qty 28, 21d supply, fill #1

## 2023-03-20 NOTE — Telephone Encounter (Signed)
I will send LOS to see her within 1 week of starting Thanks

## 2023-03-20 NOTE — Telephone Encounter (Signed)
Oral Chemotherapy Pharmacist Encounter   Attempted to reach patient to provide update and offer for initial counseling on oral medication: Xeloda (capecitabine).   No answer. Unable to leave voicemail as mailbox is full. Will try again at different time.   Lenord Carbo, PharmD, BCPS, [facility] Hematology/Oncology Clinical Pharmacist [facility] Oral Chemotherapy Navigation Clinics [phone] 03/20/2023 9:43 AM

## 2023-03-20 NOTE — Telephone Encounter (Signed)
Returned her call. She thought that the office called her. Told her the pharmacy called her regarding Xeloda Rx. Sent message to pharmacy to call her back  She has not had a bm yet. Her abdomen is rumbling and she thinks it is going to happen this afternoon. She is taking senokot and miralax. She will continue laxatives and call the office back for questions/ concerns.

## 2023-03-20 NOTE — Telephone Encounter (Signed)
Oral Chemotherapy Pharmacist Encounter  I spoke with patient for overview of: Xeloda (capecitabine) for the treatment of metastatic breast cancer, planned duration until disease progression or unacceptable drug toxicity.  Counseled patient on administration, dosing, side effects, monitoring, drug-food interactions, safe handling, storage, and disposal.  Patient will take Xeloda 500mg  tablets, 1 tablets (500mg ) by mouth in AM and 1 tablet (500mg ) by mouth in PM, within 30 minutes of finishing meals, for 14 days on, 7 days off, repeated every 21 days.  Xeloda start date: 03/24/23 AM  Adverse effects include but are not limited to: fatigue, decreased blood counts, GI upset, diarrhea, mouth sores, and hand-foot syndrome. Hand-foot syndrome: discussed use of cream such as [place] Smooth Extra Care 20 or equivalent advanced care cream that has 20% urea content for advanced skin hydration while on Xeloda Diarrhea: Patient will obtain Imodium (loperamide) to have on hand if they experience diarrhea. Patient knows to alert the office of 4 or more loose stools above baseline.  Reviewed with patient importance of keeping a medication schedule and plan for any missed doses. No barriers to medication adherence identified.  Medication reconciliation performed and medication/allergy list updated.  All questions answered.  [name] voiced understanding and appreciation.   Medication education handout placed in mail for patient. Patient knows to call the office with questions or concerns. Oral Chemotherapy Clinic phone number provided to patient.   Lenord Carbo, PharmD, BCPS, [facility] Hematology/Oncology Clinical Pharmacist [facility] Oral Chemotherapy Navigation Clinics [phone] 03/20/2023 11:43 AM

## 2023-03-23 ENCOUNTER — Other Ambulatory Visit (HOSPITAL_COMMUNITY): Payer: Self-pay

## 2023-03-23 NOTE — Telephone Encounter (Signed)
Called her back. She had a bm and feels better. The constipation is better. She will call the office back with questions/ concerns.

## 2023-03-23 NOTE — Telephone Encounter (Signed)
Can you follow-up to see if she has BM yet?

## 2023-03-26 DIAGNOSIS — C50911 Malignant neoplasm of unspecified site of right female breast: Secondary | ICD-10-CM | POA: Diagnosis not present

## 2023-03-31 ENCOUNTER — Other Ambulatory Visit: Payer: Self-pay

## 2023-03-31 ENCOUNTER — Telehealth: Payer: Self-pay

## 2023-03-31 ENCOUNTER — Inpatient Hospital Stay: Payer: BC Managed Care – PPO | Admitting: Hematology and Oncology

## 2023-03-31 ENCOUNTER — Inpatient Hospital Stay: Payer: BC Managed Care – PPO

## 2023-03-31 NOTE — Telephone Encounter (Signed)
She called and left a message. She moved today's appts to next week due to conflict.  FYI

## 2023-03-31 NOTE — Telephone Encounter (Signed)
Called and left a message asking her to call the office back. She is scheduled on 7/11 for a 20 min appt. She needs a 30 min appt. Ask her to call the office back to reschedule 7/11 appt.

## 2023-03-31 NOTE — Telephone Encounter (Signed)
20 mins is not enough time It has to be 30 mins

## 2023-03-31 NOTE — Telephone Encounter (Signed)
She called back and rescheduled appts to 7/12. She is aware of appt date/time.

## 2023-04-01 ENCOUNTER — Telehealth: Payer: Self-pay

## 2023-04-01 NOTE — Telephone Encounter (Signed)
Attempted to call her to reschedule appt to 7/9. No answer and no voicemail.

## 2023-04-01 NOTE — Telephone Encounter (Signed)
Attempted to call, no answer and no voicemail. Attempting to move 7/12 appt with Dr. Bertis Ruddy to 7/9 at 2:45 pm with labs at 2 pm. Concerned that her appt is scheduled too far out after starting a new medication.

## 2023-04-03 ENCOUNTER — Telehealth: Payer: Self-pay

## 2023-04-03 ENCOUNTER — Other Ambulatory Visit: Payer: Self-pay

## 2023-04-03 ENCOUNTER — Other Ambulatory Visit (HOSPITAL_COMMUNITY): Payer: Self-pay

## 2023-04-03 MED ORDER — CEPHALEXIN 500 MG PO CAPS
500.0000 mg | ORAL_CAPSULE | Freq: Three times a day (TID) | ORAL | 0 refills | Status: DC
  Filled 2023-04-03 (×2): qty 21, 7d supply, fill #0

## 2023-04-03 NOTE — Telephone Encounter (Signed)
Called and given below message. She verbalized understanding and appreciated the call. Keflex sent to her preferred pharmacy.

## 2023-04-03 NOTE — Telephone Encounter (Signed)
Called to move 7/12 appts to 7/9. She is aware of appts. She is asking if she can have antibiotic or referral to wound care for her breast wound, she feels the wound area may be worse. Told her I would forward the message to Dr. Bertis Ruddy.

## 2023-04-03 NOTE — Telephone Encounter (Signed)
You can call in keflex 500 mg TID PO to her pharmacy for 7 days I will discuss wound care with her next week

## 2023-04-07 ENCOUNTER — Other Ambulatory Visit (HOSPITAL_COMMUNITY): Payer: Self-pay

## 2023-04-07 ENCOUNTER — Encounter: Payer: Self-pay | Admitting: Hematology and Oncology

## 2023-04-07 ENCOUNTER — Inpatient Hospital Stay: Payer: BC Managed Care – PPO | Attending: Hematology and Oncology

## 2023-04-07 ENCOUNTER — Other Ambulatory Visit: Payer: Self-pay

## 2023-04-07 ENCOUNTER — Inpatient Hospital Stay: Payer: BC Managed Care – PPO | Admitting: Hematology and Oncology

## 2023-04-07 VITALS — BP 118/80 | HR 129 | Temp 97.7°F | Resp 18 | Ht 66.0 in | Wt 135.0 lb

## 2023-04-07 DIAGNOSIS — G893 Neoplasm related pain (acute) (chronic): Secondary | ICD-10-CM | POA: Insufficient documentation

## 2023-04-07 DIAGNOSIS — D6481 Anemia due to antineoplastic chemotherapy: Secondary | ICD-10-CM

## 2023-04-07 DIAGNOSIS — C7951 Secondary malignant neoplasm of bone: Secondary | ICD-10-CM | POA: Insufficient documentation

## 2023-04-07 DIAGNOSIS — Z9011 Acquired absence of right breast and nipple: Secondary | ICD-10-CM | POA: Insufficient documentation

## 2023-04-07 DIAGNOSIS — Z170 Estrogen receptor positive status [ER+]: Secondary | ICD-10-CM | POA: Insufficient documentation

## 2023-04-07 DIAGNOSIS — Z79899 Other long term (current) drug therapy: Secondary | ICD-10-CM | POA: Insufficient documentation

## 2023-04-07 DIAGNOSIS — T148XXD Other injury of unspecified body region, subsequent encounter: Secondary | ICD-10-CM | POA: Diagnosis not present

## 2023-04-07 DIAGNOSIS — C50911 Malignant neoplasm of unspecified site of right female breast: Secondary | ICD-10-CM | POA: Diagnosis present

## 2023-04-07 DIAGNOSIS — Z609 Problem related to social environment, unspecified: Secondary | ICD-10-CM | POA: Diagnosis not present

## 2023-04-07 DIAGNOSIS — T451X5A Adverse effect of antineoplastic and immunosuppressive drugs, initial encounter: Secondary | ICD-10-CM

## 2023-04-07 LAB — CBC WITH DIFFERENTIAL/PLATELET
Abs Immature Granulocytes: 0.02 10*3/uL (ref 0.00–0.07)
Basophils Absolute: 0.1 10*3/uL (ref 0.0–0.1)
Basophils Relative: 2 %
Eosinophils Absolute: 0.1 10*3/uL (ref 0.0–0.5)
Eosinophils Relative: 2 %
HCT: 30.6 % — ABNORMAL LOW (ref 36.0–46.0)
Hemoglobin: 9.8 g/dL — ABNORMAL LOW (ref 12.0–15.0)
Immature Granulocytes: 0 %
Lymphocytes Relative: 18 %
Lymphs Abs: 1 10*3/uL (ref 0.7–4.0)
MCH: 27.6 pg (ref 26.0–34.0)
MCHC: 32 g/dL (ref 30.0–36.0)
MCV: 86.2 fL (ref 80.0–100.0)
Monocytes Absolute: 0.6 10*3/uL (ref 0.1–1.0)
Monocytes Relative: 11 %
Neutro Abs: 3.6 10*3/uL (ref 1.7–7.7)
Neutrophils Relative %: 67 %
Platelets: 441 10*3/uL — ABNORMAL HIGH (ref 150–400)
RBC: 3.55 MIL/uL — ABNORMAL LOW (ref 3.87–5.11)
RDW: 16.6 % — ABNORMAL HIGH (ref 11.5–15.5)
WBC: 5.3 10*3/uL (ref 4.0–10.5)
nRBC: 0 % (ref 0.0–0.2)

## 2023-04-07 LAB — COMPREHENSIVE METABOLIC PANEL
ALT: 8 U/L (ref 0–44)
AST: 27 U/L (ref 15–41)
Albumin: 3.3 g/dL — ABNORMAL LOW (ref 3.5–5.0)
Alkaline Phosphatase: 56 U/L (ref 38–126)
Anion gap: 6 (ref 5–15)
BUN: 10 mg/dL (ref 8–23)
CO2: 30 mmol/L (ref 22–32)
Calcium: 8.5 mg/dL — ABNORMAL LOW (ref 8.9–10.3)
Chloride: 104 mmol/L (ref 98–111)
Creatinine, Ser: 0.9 mg/dL (ref 0.44–1.00)
GFR, Estimated: >60 mL/min (ref >60)
Glucose, Bld: 98 mg/dL (ref 70–99)
Potassium: 4.2 mmol/L (ref 3.5–5.1)
Sodium: 140 mmol/L (ref 135–145)
Total Bilirubin: 0.5 mg/dL (ref 0.3–1.2)
Total Protein: 6.9 g/dL (ref 6.5–8.1)

## 2023-04-07 MED ORDER — CEPHALEXIN 500 MG PO CAPS
500.0000 mg | ORAL_CAPSULE | Freq: Two times a day (BID) | ORAL | 0 refills | Status: AC
  Filled 2023-04-07 – 2023-04-08 (×2): qty 14, 7d supply, fill #0

## 2023-04-07 MED ORDER — MORPHINE SULFATE ER 30 MG PO TBCR
30.0000 mg | EXTENDED_RELEASE_TABLET | Freq: Two times a day (BID) | ORAL | 0 refills | Status: DC
  Filled 2023-04-07: qty 60, 30d supply, fill #0

## 2023-04-07 NOTE — Assessment & Plan Note (Signed)

## 2023-04-07 NOTE — Assessment & Plan Note (Signed)
She has delayed wound healing due to poor nutrition She is improving with oral antibiotics and I will extend antibiotics for another week I will refer her to wound care clinic for management

## 2023-04-07 NOTE — Assessment & Plan Note (Signed)
She continues to complain with a lot of pain Previously, we discussed potential compliance issue It is not clear whether she is taking her medications correctly Her mother will help monitor medication compliance while at home I refilled her prescription morphine sulfate today and reminded the patient to bring all her pill bottles in her next visit

## 2023-04-07 NOTE — Progress Notes (Unsigned)
[facility] OFFICE PROGRESS NOTE  Patient Care Team: Artis Delay, MD as PCP - General (Hematology and Oncology)  ASSESSMENT & PLAN:  Breast cancer, stage 4, right (HCC) Overall, she tolerated Xeloda well Her skin at the front of her chest wall appears to be healing Overall, I believe she had positive response to therapy We discussed importance of nutrition to help with wound healing I will see her again next week for further follow-up  Wound healing, delayed She has delayed wound healing due to poor nutrition She is improving with oral antibiotics and I will extend antibiotics for another week I will refer her to wound care clinic for management  Cancer associated pain She continues to complain with a lot of pain Previously, we discussed potential compliance issue It is not clear whether she is taking her medications correctly Her mother will help monitor medication compliance while at home I refilled her prescription morphine sulfate today and reminded the patient to bring all her pill bottles in her next visit  Anemia due to antineoplastic chemotherapy This is likely due to recent treatment. The patient denies recent history of bleeding such as epistaxis, hematuria or hematochezia. She is asymptomatic from the anemia. I will observe for now.  She does not require transfusion now. I will continue the chemotherapy at current dose without dosage adjustment.  If the anemia gets progressive worse in the future, I might have to delay her treatment or adjust the chemotherapy dose.   Orders Placed This Encounter  Procedures   AMB referral to wound care center    Referral Priority:   Routine    Referral Type:   Consultation    Number of Visits Requested:   1    All questions were answered. The patient knows to call the clinic with any problems, questions or concerns. The total time spent in the appointment was 40 minutes encounter with patients including review of chart  and various tests results, discussions about plan of care and coordination of care plan   Artis Delay, MD 04/07/2023 3:28 PM  INTERVAL HISTORY: Please see below for problem oriented charting. she returns for treatment follow-up  REVIEW OF SYSTEMS:   Constitutional: Denies fevers, chills or abnormal weight loss Eyes: Denies blurriness of vision Ears, nose, mouth, throat, and face: Denies mucositis or sore throat Respiratory: Denies cough, dyspnea or wheezes Cardiovascular: Denies palpitation, chest discomfort or lower extremity swelling Gastrointestinal:  Denies nausea, heartburn or change in bowel habits Skin: Denies abnormal skin rashes Lymphatics: Denies new lymphadenopathy or easy bruising Neurological:Denies numbness, tingling or new weaknesses Behavioral/Psych: Mood is stable, no new changes  All other systems were reviewed with the patient and are negative.  I have reviewed the past medical history, past surgical history, social history and family history with the patient and they are unchanged from previous note.  ALLERGIES:  has No Known Allergies.  MEDICATIONS:  Current Outpatient Medications  Medication Sig Dispense Refill   calcium carbonate (TUMS - DOSED IN MG ELEMENTAL CALCIUM) 500 MG chewable tablet Chew 1 tablet by mouth 2 (two) times daily.     capecitabine (XELODA) 500 MG tablet Take 1 tablet (500 mg total) by mouth 2 (two) times daily after a meal. Take within 30 minutes after meals. Take for 14 days on, 7 days off. Repeat every 21 days. 28 tablet 11   cephALEXin (KEFLEX) 500 MG capsule Take 1 capsule (500 mg total) by mouth 2 (two) times daily for 7 days. 14 capsule  0   cholecalciferol (VITAMIN D3) 25 MCG (1000 UNIT) tablet Take 2,000 Units by mouth daily.     famotidine (PEPCID) 20 MG tablet Take 1 tablet (20 mg total) by mouth 2 (two) times daily. 60 tablet 2   HYDROmorphone (DILAUDID) 8 MG tablet Take 1 tablet (8 mg total) by mouth every 6 (six) hours as needed  for moderate pain. 90 tablet 0   magnesium oxide (MAG-OX) 400 (240 Mg) MG tablet Take 1 tablet (400 mg total) by mouth daily. 30 tablet 3   metoprolol tartrate (LOPRESSOR) 25 MG tablet Take 1 tablet (25 mg total) by mouth 2 (two) times daily. 60 tablet 0   metroNIDAZOLE (METROGEL) 1 % gel Apply topically in the morning and at bedtime. Apply topical 2 x day for 2 weeks 60 g 0   mirtazapine (REMERON) 7.5 MG tablet Take 1 tablet (7.5 mg total) by mouth at bedtime. 30 tablet 1   morphine (MS CONTIN) 30 MG 12 hr tablet Take 1 tablet (30 mg total) by mouth every 12 (twelve) hours. 60 tablet 0   polyethylene glycol (MIRALAX / GLYCOLAX) 17 g packet Take 17 g by mouth 2 (two) times daily.     senna (SENOKOT) 8.6 MG tablet Take 2 tablets by mouth 2 (two) times daily.     No current facility-administered medications for this visit.    SUMMARY OF ONCOLOGIC HISTORY: Oncology History Overview Note  The tumor cells are EQUIVOCAL For Her2 (2+).   Estrogen Receptor:       POSITIVE, 100%, STRONG STAINING  Progesterone Receptor:   NEGATIVE (0)  Proliferation Marker Ki-67:   45%    Breast cancer, stage 4, right (HCC)  03/22/2022 Initial Diagnosis   Primary breast malignancy (HCC)   03/24/2022 Pathology Results   SURGICAL PATHOLOGY  CASE: [id number]  PATIENT: [name]  Surgical Pathology Report   Clinical History: Ulcerative right breast lesion and probable metastatic breast cancer ([facility])   FINAL MICROSCOPIC DIAGNOSIS:   A. SOFT TISSUE MASS, RIGHT UPPER CHEST WALL, NEEDLE CORE BIOPSY:  - Invasive ductal carcinoma, see comment  - Ductal carcinoma in situ: Not identified   - Tubule formation: Score 3  - Nuclear pleomorphism: Score 3  - Mitotic count: Score 3  - Total score: 9  - Overall Grade: 3  - Lymphovascular invasion: Not identified  - Cancer Length: 13 mm  - Calcifications: Not identified  - Other findings: None    03/28/2022 Cancer Staging   Staging form: Breast, AJCC 8th  Edition - Clinical stage from 03/28/2022: Stage IV (cT4d, cN3c, pM1, G3, ER+, PR-, HER2: Equivocal) - Signed by Artis Delay, MD on 03/28/2022 Stage prefix: Initial diagnosis Nuclear grade: G3 Histologic grading system: 3 grade system   04/17/2022 -  Chemotherapy   She started taking combination of abemaciclib with anastrozole   06/27/2022 Imaging   1. Ulcerated mass of the right breast with extension of nodular enhancing soft tissue to the right chest wall slight extension into the contralateral left breast, markedly decreased in size when compared with prior exam. 2. Interval decreased size of left axillary lymph node. 3. Solid pulmonary nodules are decreased in size when compared with prior exam. 4. Large right pleural effusion with complete collapse of the right lower lobe, pleural effusion is decreased in size when compared with prior exam. 5. Stable low-attenuation liver lesions which are likely simple cysts. 6. Lytic and sclerotic osseous lesions again seen, many of which demonstrate increased sclerosis, likely due to treatment  response. 7. Pathologic compression fracture of T3 demonstrates increased height loss, although there is no evidence of significant retropulsion. 8. Large stool ball in the rectum, correlate for constipation.     08/02/2022 - 08/30/2022 Hospital Admission   She was admitted to the hospital due to malignant pleural effusion and was diagnosed with MSSA empyema   08/10/2022 Imaging   1. Large loculated right hydropneumothorax encompassing about 75% of right hemithoracic volume. Pigtail right pleural drainage catheter in place. 2. Moderate to large left pleural effusion encompassing about 1/3 of the left hemithoracic volume. 3. Right axillary mass or lymph node measures 1.8 cm in short axis, formerly 1.9 cm. Other previously seen chest wall soft tissue lesions are less conspicuous on today's exam due to lack of IV contrast, but likely still present. 4. Bony destructive  findings of the right fifth rib laterally compatible with metastatic disease. 5. Extensive sclerosis in the sternum favoring metastatic disease over infection. 6. Nearly complete central collapse of the T3 vertebral body with associated sclerosis and minimal posterior bony retropulsion, unchanged from prior. 7. Diffuse hazy edema/infiltration of the mediastinal and subcutaneous adipose tissues compatible with third spacing of fluid. 8. Aortic atherosclerosis.     01/13/2023 Imaging   CT CHEST ABDOMEN PELVIS W CONTRAST  Result Date: 01/12/2023 CLINICAL DATA:  Stage IV invasive breast cancer. Ongoing chemotherapy. Assess treatment response. * Tracking Code: BO * EXAM: CT CHEST, ABDOMEN, AND PELVIS WITH CONTRAST TECHNIQUE: Multidetector CT imaging of the chest, abdomen and pelvis was performed following the standard protocol during bolus administration of intravenous contrast. RADIATION DOSE REDUCTION: This exam was performed according to the departmental dose-optimization program which includes automated exposure control, adjustment of the mA and/or kV according to patient size and/or use of iterative reconstruction technique. CONTRAST:  OMNIPAQUE IOHEXOL 300 MG/ML  SOLN COMPARISON:  CT chest 08/10/2022. Chest abdomen pelvis CT 06/25/2022. Numerous prior x-rays FINDINGS: CT CHEST FINDINGS Cardiovascular: Right side of the heart is slightly enlarged. No pericardial effusion the thoracic aorta has a normal course and caliber. Mild vascular calcifications. Mediastinum/Nodes: Slightly patulous thoracic esophagus. Preserved thyroid gland. No abnormal lymph node enlargement seen in the left axillary region, left chest wall, left axillary region or mediastinum small right hilar node on image 33 of series three, nonpathologic by size criteria slightly more prominent than usually seen. This was not as well seen on the prior CT scan of November 2023 but the study was without contrast. On the older study with  contrast from 06/25/2022 this node was present and stable. Enlarged right axillary lymph nodes identified. On the study of November 2023 a node was measured at 18 by 28 mm and today this same node measures 2.7 x 1.5 cm on series 3, image 29. Lungs/Pleura: Left basilar atelectasis. No pneumothorax or effusion. No dominant left-sided lung nodule. There is some volume loss along the right hemithorax with a small effusion and pleural thickening. Small loculated pleural effusion at the right lateral lung base as well such as series 3, image 47 measuring 7.2 by 2.6 cm. Adjacent lung opacity. There is a nodule in the right lower lobe which is noncalcified on series 7, image 74 measuring 5 mm. This area was obscured by a more significant opacity on the prior CT scans of the larger effusion and pleural drain. Attention on follow-up. Is also a spiculated nodule right upper lobe today on series 7, image 32 measuring 7 by 4 mm. In retrospect this nodule was present in November 2023 measuring  6 3 mm. In September 2023 this measured 4 mm. Slowly increasing in size. Musculoskeletal: Curvature of the spine with some degenerative changes. There is areas of sclerosis seen along several vertebral bodies, the sternum and ribs with some lytic areas consistent with bone metastases. There is severe compression of the T3 level which is unchanged from previous. The areas of sclerosis involving T4 level in the sternum appears similar. There is diffuse anasarca with several soft tissue nodules identified in the right chest wall including dermal, subdermal along muscular. Many these areas are significantly increased. Example soft tissue nodule anteriorly on series 3, image 34 extending out to the skin measures 19 by 19 mm today and previously small amount of thickening would have measured up to 5 mm in thickness. Again several other areas are significantly increased. Previous right mastectomy. CT ABDOMEN PELVIS FINDINGS Hepatobiliary: Multiple  hepatic cysts are again identified. Gallbladder is present. Patent portal vein. Pancreas: Unremarkable. No pancreatic ductal dilatation or surrounding inflammatory changes. Spleen: Normal in size without focal abnormality. Adrenals/Urinary Tract: The adrenal glands are preserved. Upper pole left-sided Bosniak 1 renal cyst again seen measuring 2.7 cm in diameter. Hounsfield unit of 5. No specific imaging follow-up. Tiny focus elsewhere as well in the mid left kidney. No imaging follow-up. These are unchanged from previous. Preserved contours of the collapsed urinary bladder. Stomach/Bowel: Bowel is nondilated. Diffuse colonic stool. Cecum resides in the right hemipelvis. Presumed normal retrocecal air-filled appendix. Vascular/Lymphatic: Normal caliber aorta and IVC. No discrete abnormal lymph node enlargement identified in the abdomen and pelvis. Reproductive: Uterus and bilateral adnexa are unremarkable. Other: Anasarca.  No free air or free fluid identified. Musculoskeletal: Curvature of the spine. Multifocal degenerative changes of the spine and pelvis there is also lytic and sclerotic bone lesions identified along the pelvis and lumbar spine. Distribution by CT appears similar to the previous. Schmorl's node compression deformity again seen at L4. IMPRESSION: Worsening disease with multiple areas of nodular skin thickening along the right hemithorax and chest wall with additional underlying subcutaneous fat and muscular nodules. Persistent axillary lymph nodes. Small right pleural effusion seen with a loculated component on the right side of the lung base. Adjacent lung opacities. Small right-sided lung nodules identified which were not as well seen on the prior, possibly due to the level of the lung opacity but are felt to be increasing slightly. Attention on short follow-up. Mixed lucent and sclerotic bone lesions are again identified with stable compression of T3. Electronically Signed   By: Karen Kays M.D.    On: 01/12/2023 15:09      03/19/2023 Imaging   CT CHEST ABDOMEN PELVIS W CONTRAST  Result Date: 03/19/2023 CLINICAL DATA:  Stage IV breast cancer. Restaging. * Tracking Code: BO * EXAM: CT CHEST, ABDOMEN, AND PELVIS WITH CONTRAST TECHNIQUE: Multidetector CT imaging of the chest, abdomen and pelvis was performed following the standard protocol during bolus administration of intravenous contrast. RADIATION DOSE REDUCTION: This exam was performed according to the departmental dose-optimization program which includes automated exposure control, adjustment of the mA and/or kV according to patient size and/or use of iterative reconstruction technique. CONTRAST:  OMNIPAQUE IOHEXOL 300 MG/ML  SOLN COMPARISON:  01/09/2023 FINDINGS: CT CHEST FINDINGS Cardiovascular: The heart size is upper normal to borderline enlarged. No substantial pericardial effusion. Mediastinum/Nodes: No mediastinal lymphadenopathy. Small hilar lymph nodes evident bilaterally. The esophagus has normal imaging features. No left axillary lymphadenopathy. Lungs/Pleura: Volume loss with architectural distortion and pleuroparenchymal scarring in the inferior right lung  is similar to prior. 7 mm right apical nodule identified previously is unchanged on 30/4 today. 5 mm nodule in the medial right base identified previously is stable on 76/4. Interval development of masslike consolidative opacity in the posterior left lower lobe (120/4) suspicious for pneumonia given lack of appreciable volume loss in the left lower lobe. Right pleural effusion is similar to prior although measured loculated component on the previous study is 7.2 x 2.6 cm is now 5.9 x 1.7 cm on 46/3. Musculoskeletal: The numerous right-sided predominant chest wall lesions are progressive in the interval. Index anterior right paramidline lesion measured previously at 1.9 x 1.9 cm is 3.1 x 2.5 cm today on image 31/3. Skin thickening lateral lower chest wall is 8 mm today on image  52/3 which corresponds to 4 mm when measuring at the same level on the prior study. Chest wall edema noted right greater than left. Sclerotic lesion in the anterior right eighth rib is similar to prior. Marked compression deformity at T3 again noted. Sclerotic sternal lesion is similar to prior. CT ABDOMEN PELVIS FINDINGS Hepatobiliary: Multiple hepatic cysts are again noted. Capsular irregularity along the hemidiaphragm is similar to prior. There is no evidence for gallstones, gallbladder wall thickening, or pericholecystic fluid. No intrahepatic or extrahepatic biliary dilation. Pancreas: Interval development of diffuse distention of the main pancreatic duct measuring up to 4 mm diameter. No discernible obstructing mass lesion in the head of the pancreas. Spleen: No splenomegaly. No suspicious focal mass lesion. Adrenals/Urinary Tract: No adrenal nodule or mass. Opacification of the bilateral renal calices consistent with early excretion of contrast material. Small cyst noted upper pole left kidney, as before. No followup imaging is recommended. No evidence for hydroureter. The urinary bladder appears normal for the degree of distention. Stomach/Bowel: Stomach is moderately distended with fluid. Mild diffuse distention of small bowel and colon noted with moderate to large stool volume evident. Vascular/Lymphatic: No abdominal aortic aneurysm. There is no gastrohepatic or hepatoduodenal ligament lymphadenopathy. No retroperitoneal or mesenteric lymphadenopathy. No pelvic sidewall lymphadenopathy. 10 mm short axis right groin node on 125/3 is upper normal for size. Reproductive: Unremarkable. Other: No substantial intraperitoneal free fluid. Musculoskeletal: Similar appearance of the lesion in the right pubic bone. Lucent lesion in the left L4 vertebral body is similar to prior. IMPRESSION: 1. Interval progression of the numerous right-sided predominant chest wall lesions. 2. Interval development of masslike  consolidative opacity in the posterior left lower lobe suspicious for pneumonia given lack of appreciable volume loss in the left lower lobe. 3. No interval change in index right-sided pulmonary nodules. Continued attention on follow-up suggested. 4. Interval development of diffuse distention of the main pancreatic duct measuring up to 4 mm diameter. No discernible obstructing mass lesion in the head of the pancreas. Close attention on follow-up recommended. 5. Similar appearance of the osseous metastatic disease. 6. No substantial change in right pleural effusion although measured loculated component on the previous study has decreased in the interval. 7. Mild diffuse distention of small bowel and colon with moderate to large stool volume. Correlation for signs/symptoms of clinical constipation suggested. Electronically Signed   By: Kennith Center M.D.   On: 03/19/2023 08:27        PHYSICAL EXAMINATION: ECOG PERFORMANCE STATUS: 2 - Symptomatic, <50% confined to bed  Vitals:   04/07/23 1444  BP: 118/80  Pulse: (!) 129  Resp: 18  Temp: 97.7 F (36.5 C)  SpO2: 100%   Filed Weights   04/07/23 1444  Weight:  135 lb (61.2 kg)    GENERAL:alert, no distress and comfortable SKIN: Significant odor emanating from her wound.  Overall, there are eschar at the front of her chest but the skin nodules are less.  They are healthy granulation tissue.  Overall, her skin/chest wall examination has improved compared to prior visit EYES: normal, Conjunctiva are pink and non-injected, sclera clear OROPHARYNX:no exudate, no erythema and lips, buccal mucosa, and tongue normal  NECK: supple, thyroid normal size, non-tender, without nodularity LYMPH:  no palpable lymphadenopathy in the cervical, axillary or inguinal LUNGS: clear to auscultation and percussion with normal breathing effort HEART: regular rate & rhythm and no murmurs and no lower extremity edema ABDOMEN:abdomen soft, non-tender and normal bowel  sounds Musculoskeletal:no cyanosis of digits and no clubbing  NEURO: alert & oriented x 3 with fluent speech, no focal motor/sensory deficits  LABORATORY DATA:  I have reviewed the data as listed    Component Value Date/Time   NA 140 04/07/2023 1328   K 4.2 04/07/2023 1328   CL 104 04/07/2023 1328   CO2 30 04/07/2023 1328   GLUCOSE 98 04/07/2023 1328   BUN 10 04/07/2023 1328   CREATININE 0.90 04/07/2023 1328   CREATININE 1.03 (H) 11/28/2022 1242   CALCIUM 8.5 (L) 04/07/2023 1328   PROT 6.9 04/07/2023 1328   ALBUMIN 3.3 (L) 04/07/2023 1328   AST 27 04/07/2023 1328   AST 22 11/28/2022 1242   ALT 8 04/07/2023 1328   ALT 13 11/28/2022 1242   ALKPHOS 56 04/07/2023 1328   BILITOT 0.5 04/07/2023 1328   BILITOT 0.5 11/28/2022 1242   GFRNONAA >60 04/07/2023 1328   GFRNONAA >60 11/28/2022 1242    No results found for: "SPEP", "UPEP"  Lab Results  Component Value Date   WBC 5.3 04/07/2023   NEUTROABS 3.6 04/07/2023   HGB 9.8 (L) 04/07/2023   HCT 30.6 (L) 04/07/2023   MCV 86.2 04/07/2023   PLT 441 (H) 04/07/2023      Chemistry      Component Value Date/Time   NA 140 04/07/2023 1328   K 4.2 04/07/2023 1328   CL 104 04/07/2023 1328   CO2 30 04/07/2023 1328   BUN 10 04/07/2023 1328   CREATININE 0.90 04/07/2023 1328   CREATININE 1.03 (H) 11/28/2022 1242      Component Value Date/Time   CALCIUM 8.5 (L) 04/07/2023 1328   ALKPHOS 56 04/07/2023 1328   AST 27 04/07/2023 1328   AST 22 11/28/2022 1242   ALT 8 04/07/2023 1328   ALT 13 11/28/2022 1242   BILITOT 0.5 04/07/2023 1328   BILITOT 0.5 11/28/2022 1242

## 2023-04-07 NOTE — Assessment & Plan Note (Signed)
Overall, she tolerated Xeloda well Her skin at the front of her chest wall appears to be healing Overall, I believe she had positive response to therapy We discussed importance of nutrition to help with wound healing I will see her again next week for further follow-up

## 2023-04-08 ENCOUNTER — Telehealth: Payer: Self-pay | Admitting: *Deleted

## 2023-04-08 ENCOUNTER — Other Ambulatory Visit (HOSPITAL_COMMUNITY): Payer: Self-pay

## 2023-04-08 ENCOUNTER — Encounter: Payer: Self-pay | Admitting: Hematology and Oncology

## 2023-04-08 ENCOUNTER — Other Ambulatory Visit: Payer: Self-pay

## 2023-04-08 NOTE — Telephone Encounter (Signed)
Patient contacted office. She called [facility] this morning and is scheduled for appt on August 7 at 1:45 pm. She said she was informed it was the earliest available and that she was placed on a cancellation list to be notified of earlier appts.  [name] asked if Dr. Maxine Burch office could contact [facility] on her behalf to see if an earlier appt was available. Contacted [facility] [phone] and left message with information regarding patient's referral due to non healing wound and current appt.  Requested call back to Dr. Maxine Burch nurse desk to schedule earlier appt for patient.

## 2023-04-09 ENCOUNTER — Ambulatory Visit: Payer: BC Managed Care – PPO | Admitting: Hematology and Oncology

## 2023-04-09 ENCOUNTER — Other Ambulatory Visit: Payer: Self-pay

## 2023-04-09 ENCOUNTER — Other Ambulatory Visit (HOSPITAL_COMMUNITY): Payer: Self-pay

## 2023-04-09 ENCOUNTER — Other Ambulatory Visit: Payer: BC Managed Care – PPO

## 2023-04-10 ENCOUNTER — Ambulatory Visit: Payer: BC Managed Care – PPO | Admitting: Hematology and Oncology

## 2023-04-10 ENCOUNTER — Other Ambulatory Visit: Payer: BC Managed Care – PPO

## 2023-04-17 ENCOUNTER — Other Ambulatory Visit: Payer: Self-pay

## 2023-04-17 ENCOUNTER — Inpatient Hospital Stay: Payer: BC Managed Care – PPO

## 2023-04-17 ENCOUNTER — Other Ambulatory Visit (HOSPITAL_COMMUNITY): Payer: Self-pay

## 2023-04-17 ENCOUNTER — Encounter: Payer: Self-pay | Admitting: Hematology and Oncology

## 2023-04-17 ENCOUNTER — Inpatient Hospital Stay (HOSPITAL_BASED_OUTPATIENT_CLINIC_OR_DEPARTMENT_OTHER): Payer: BC Managed Care – PPO | Admitting: Hematology and Oncology

## 2023-04-17 VITALS — BP 120/75 | HR 114 | Resp 18 | Ht 66.0 in | Wt 134.8 lb

## 2023-04-17 DIAGNOSIS — G893 Neoplasm related pain (acute) (chronic): Secondary | ICD-10-CM

## 2023-04-17 DIAGNOSIS — D6481 Anemia due to antineoplastic chemotherapy: Secondary | ICD-10-CM

## 2023-04-17 DIAGNOSIS — T148XXD Other injury of unspecified body region, subsequent encounter: Secondary | ICD-10-CM | POA: Diagnosis not present

## 2023-04-17 DIAGNOSIS — C50911 Malignant neoplasm of unspecified site of right female breast: Secondary | ICD-10-CM | POA: Diagnosis not present

## 2023-04-17 DIAGNOSIS — T451X5A Adverse effect of antineoplastic and immunosuppressive drugs, initial encounter: Secondary | ICD-10-CM

## 2023-04-17 DIAGNOSIS — C7951 Secondary malignant neoplasm of bone: Secondary | ICD-10-CM

## 2023-04-17 DIAGNOSIS — Z609 Problem related to social environment, unspecified: Secondary | ICD-10-CM

## 2023-04-17 LAB — CBC WITH DIFFERENTIAL/PLATELET
Abs Immature Granulocytes: 0.03 10*3/uL (ref 0.00–0.07)
Basophils Absolute: 0.1 10*3/uL (ref 0.0–0.1)
Basophils Relative: 1 %
Eosinophils Absolute: 0 10*3/uL (ref 0.0–0.5)
Eosinophils Relative: 0 %
HCT: 31.3 % — ABNORMAL LOW (ref 36.0–46.0)
Hemoglobin: 10.2 g/dL — ABNORMAL LOW (ref 12.0–15.0)
Immature Granulocytes: 0 %
Lymphocytes Relative: 15 %
Lymphs Abs: 1.3 10*3/uL (ref 0.7–4.0)
MCH: 27.6 pg (ref 26.0–34.0)
MCHC: 32.6 g/dL (ref 30.0–36.0)
MCV: 84.8 fL (ref 80.0–100.0)
Monocytes Absolute: 0.6 10*3/uL (ref 0.1–1.0)
Monocytes Relative: 6 %
Neutro Abs: 6.8 10*3/uL (ref 1.7–7.7)
Neutrophils Relative %: 78 %
Platelets: 475 10*3/uL — ABNORMAL HIGH (ref 150–400)
RBC: 3.69 MIL/uL — ABNORMAL LOW (ref 3.87–5.11)
RDW: 16.4 % — ABNORMAL HIGH (ref 11.5–15.5)
WBC: 8.9 10*3/uL (ref 4.0–10.5)
nRBC: 0 % (ref 0.0–0.2)

## 2023-04-17 LAB — COMPREHENSIVE METABOLIC PANEL
ALT: 9 U/L (ref 0–44)
AST: 41 U/L (ref 15–41)
Albumin: 3.5 g/dL (ref 3.5–5.0)
Alkaline Phosphatase: 57 U/L (ref 38–126)
Anion gap: 6 (ref 5–15)
BUN: 12 mg/dL (ref 8–23)
CO2: 30 mmol/L (ref 22–32)
Calcium: 9.2 mg/dL (ref 8.9–10.3)
Chloride: 104 mmol/L (ref 98–111)
Creatinine, Ser: 0.89 mg/dL (ref 0.44–1.00)
GFR, Estimated: >60 mL/min (ref >60)
Glucose, Bld: 106 mg/dL — ABNORMAL HIGH (ref 70–99)
Potassium: 3.8 mmol/L (ref 3.5–5.1)
Sodium: 140 mmol/L (ref 135–145)
Total Bilirubin: 0.5 mg/dL (ref 0.3–1.2)
Total Protein: 6.9 g/dL (ref 6.5–8.1)

## 2023-04-17 MED ORDER — DENOSUMAB 120 MG/1.7ML ~~LOC~~ SOLN
120.0000 mg | Freq: Once | SUBCUTANEOUS | Status: AC
  Administered 2023-04-17: 120 mg via SUBCUTANEOUS

## 2023-04-17 MED ORDER — CAPECITABINE 500 MG PO TABS
1000.0000 mg | ORAL_TABLET | Freq: Two times a day (BID) | ORAL | 11 refills | Status: DC
Start: 2023-04-17 — End: 2023-05-30
  Filled 2023-04-17: qty 56, 14d supply, fill #0
  Filled 2023-05-05: qty 56, 21d supply, fill #0

## 2023-04-17 NOTE — Assessment & Plan Note (Signed)
She brought some her of pill bottles for [place] to verify her medication intake She has been combining old prescriptions and new ones; her current prescription bottle of dilaudid indicated refill date from April According to her, she threw away the recent bottles and combined the pills together I recommend her not to do that She does not need refills today

## 2023-04-17 NOTE — Assessment & Plan Note (Signed)
Overall, she tolerated Xeloda well We discussed importance of nutrition to help with wound healing I will see her again in 2 weeks for further follow-up

## 2023-04-17 NOTE — Patient Instructions (Signed)
Denosumab Injection (Oncology) What is this medication? DENOSUMAB (den oh SUE mab) prevents weakened bones caused by cancer. It may also be used to treat noncancerous bone tumors that cannot be removed by surgery. It can also be used to treat high calcium levels in the blood caused by cancer. It works by blocking a protein that causes bones to break down quickly. This slows down the release of calcium from bones, which lowers calcium levels in your blood. It also makes your bones stronger and less likely to break (fracture). This medicine may be used for other purposes; ask your health care provider or pharmacist if you have questions. COMMON BRAND NAME(S): XGEVA What should I tell my care team before I take this medication? They need to know if you have any of these conditions: Dental disease Having surgery or tooth extraction Infection Kidney disease Low levels of calcium or vitamin D in the blood Malnutrition On hemodialysis Skin conditions or sensitivity Thyroid or parathyroid disease An unusual reaction to denosumab, other medications, foods, dyes, or preservatives Pregnant or trying to get pregnant Breast-feeding How should I use this medication? This medication is for injection under the skin. It is given by your care team in a hospital or clinic setting. A special MedGuide will be given to you before each treatment. Be sure to read this information carefully each time. Talk to your care team about the use of this medication in children. While it may be prescribed for children as young as 13 years for selected conditions, precautions do apply. Overdosage: If you think you have taken too much of this medicine contact a poison control center or emergency room at once. NOTE: This medicine is only for you. Do not share this medicine with others. What if I miss a dose? Keep appointments for follow-up doses. It is important not to miss your dose. Call your care team if you are unable to  keep an appointment. What may interact with this medication? Do not take this medication with any of the following: Other medications containing denosumab This medication may also interact with the following: Medications that lower your chance of fighting infection Steroid medications, such as prednisone or cortisone This list may not describe all possible interactions. Give your health care provider a list of all the medicines, herbs, non-prescription drugs, or dietary supplements you use. Also tell them if you smoke, drink alcohol, or use illegal drugs. Some items may interact with your medicine. What should I watch for while using this medication? Your condition will be monitored carefully while you are receiving this medication. You may need blood work while taking this medication. This medication may increase your risk of getting an infection. Call your care team for advice if you get a fever, chills, sore throat, or other symptoms of a cold or flu. Do not treat yourself. Try to avoid being around people who are sick. You should make sure you get enough calcium and vitamin D while you are taking this medication, unless your care team tells you not to. Discuss the foods you eat and the vitamins you take with your care team. Some people who take this medication have severe bone, joint, or muscle pain. This medication may also increase your risk for jaw problems or a broken thigh bone. Tell your care team right away if you have severe pain in your jaw, bones, joints, or muscles. Tell your care team if you have any pain that does not go away or that gets worse. Talk   to your care team if you may be pregnant. Serious birth defects can occur if you take this medication during pregnancy and for 5 months after the last dose. You will need a negative pregnancy test before starting this medication. Contraception is recommended while taking this medication and for 5 months after the last dose. Your care team  can help you find the option that works for you. What side effects may I notice from receiving this medication? Side effects that you should report to your care team as soon as possible: Allergic reactions--skin rash, itching, hives, swelling of the face, lips, tongue, or throat Bone, joint, or muscle pain Low calcium level--muscle pain or cramps, confusion, tingling, or numbness in the hands or feet Osteonecrosis of the jaw--pain, swelling, or redness in the mouth, numbness of the jaw, poor healing after dental work, unusual discharge from the mouth, visible bones in the mouth Side effects that usually do not require medical attention (report to your care team if they continue or are bothersome): Cough Diarrhea Fatigue Headache Nausea This list may not describe all possible side effects. Call your doctor for medical advice about side effects. You may report side effects to FDA at 1-800-FDA-1088. Where should I keep my medication? This medication is given in a hospital or clinic. It will not be stored at home. NOTE: This sheet is a summary. It may not cover all possible information. If you have questions about this medicine, talk to your doctor, pharmacist, or health care provider.  2024 [employer] (2022-02-05 00:00:00)  

## 2023-04-17 NOTE — Progress Notes (Signed)
[facility] OFFICE PROGRESS NOTE  Patient Care Team: Artis Delay, MD as PCP - General (Hematology and Oncology)  ASSESSMENT & PLAN:  Breast cancer, stage 4, right (HCC) Overall, she tolerated Xeloda well We discussed importance of nutrition to help with wound healing I will see her again in 2 weeks for further follow-up  Cancer associated pain She brought some her of pill bottles for [place] to verify her medication intake She has been combining old prescriptions and new ones; her current prescription bottle of dilaudid indicated refill date from April According to her, she threw away the recent bottles and combined the pills together I recommend her not to do that She does not need refills today  Wound healing, delayed Wound care appointment is pending She will finish her current prescription of Keflex and then use metrogel  Anemia due to antineoplastic chemotherapy This is likely due to recent treatment. The patient denies recent history of bleeding such as epistaxis, hematuria or hematochezia. She is asymptomatic from the anemia. I will observe for now.  She does not require transfusion now. I will continue the chemotherapy at current dose without dosage adjustment.  If the anemia gets progressive worse in the future, I might have to delay her treatment or adjust the chemotherapy dose. I plan to increase the xeloda dose next cycle  Breast cancer metastasized to bone, right (HCC) She will receive X geva today We discussed importance of calcium and vitamin D  Poor social situation She has limited knowledge and is not taking her medications correctly I have asked my nursing staff to help put her medications into her pill boxes  No orders of the defined types were placed in this encounter.   All questions were answered. The patient knows to call the clinic with any problems, questions or concerns. The total time spent in the appointment was 40 minutes encounter with  patients including review of chart and various tests results, discussions about plan of care and coordination of care plan   Artis Delay, MD 04/17/2023 2:28 PM  INTERVAL HISTORY: Please see below for problem oriented charting. she returns for treatment follow-up with her mother She brought a few medicine pill bottles with her Her pain is stable She claims she is taking all her medications are prescribed We spent a lot of time today reviewing all her medications  REVIEW OF SYSTEMS:   Constitutional: Denies fevers, chills or abnormal weight loss Eyes: Denies blurriness of vision Ears, nose, mouth, throat, and face: Denies mucositis or sore throat Respiratory: Denies cough, dyspnea or wheezes Cardiovascular: Denies palpitation, chest discomfort or lower extremity swelling Gastrointestinal:  Denies nausea, heartburn or change in bowel habits Skin: Denies abnormal skin rashes Lymphatics: Denies new lymphadenopathy or easy bruising Neurological:Denies numbness, tingling or new weaknesses Behavioral/Psych: Mood is stable, no new changes  All other systems were reviewed with the patient and are negative.  I have reviewed the past medical history, past surgical history, social history and family history with the patient and they are unchanged from previous note.  ALLERGIES:  has No Known Allergies.  MEDICATIONS:  Current Outpatient Medications  Medication Sig Dispense Refill   calcium carbonate (TUMS - DOSED IN MG ELEMENTAL CALCIUM) 500 MG chewable tablet Chew 1 tablet by mouth 2 (two) times daily.     capecitabine (XELODA) 500 MG tablet Take 2 tablets (1,000 mg total) by mouth 2 (two) times daily after a meal. Take within 30 minutes after meals. Take for 14 days on,  7 days off. Repeat every 21 days. 56 tablet 11   cholecalciferol (VITAMIN D3) 25 MCG (1000 UNIT) tablet Take 2,000 Units by mouth daily.     famotidine (PEPCID) 20 MG tablet Take 1 tablet (20 mg total) by mouth 2 (two) times  daily. 60 tablet 2   HYDROmorphone (DILAUDID) 8 MG tablet Take 1 tablet (8 mg total) by mouth every 6 (six) hours as needed for moderate pain. 90 tablet 0   magnesium oxide (MAG-OX) 400 (240 Mg) MG tablet Take 1 tablet (400 mg total) by mouth daily. 30 tablet 3   metoprolol tartrate (LOPRESSOR) 25 MG tablet Take 1 tablet (25 mg total) by mouth 2 (two) times daily. 60 tablet 0   metroNIDAZOLE (METROGEL) 1 % gel Apply topically in the morning and at bedtime. Apply topical 2 x day for 2 weeks 60 g 0   mirtazapine (REMERON) 7.5 MG tablet Take 1 tablet (7.5 mg total) by mouth at bedtime. 30 tablet 1   morphine (MS CONTIN) 30 MG 12 hr tablet Take 1 tablet (30 mg total) by mouth every 12 (twelve) hours. 60 tablet 0   polyethylene glycol (MIRALAX / GLYCOLAX) 17 g packet Take 17 g by mouth 2 (two) times daily.     senna (SENOKOT) 8.6 MG tablet Take 2 tablets by mouth 2 (two) times daily.     No current facility-administered medications for this visit.    SUMMARY OF ONCOLOGIC HISTORY: Oncology History Overview Note  The tumor cells are EQUIVOCAL For Her2 (2+).   Estrogen Receptor:       POSITIVE, 100%, STRONG STAINING  Progesterone Receptor:   NEGATIVE (0)  Proliferation Marker Ki-67:   45%    Breast cancer, stage 4, right (HCC)  03/22/2022 Initial Diagnosis   Primary breast malignancy (HCC)   03/24/2022 Pathology Results   SURGICAL PATHOLOGY  CASE: [phone]  PATIENT: [name]  Surgical Pathology Report   Clinical History: Ulcerative right breast lesion and probable metastatic breast cancer ([facility])   FINAL MICROSCOPIC DIAGNOSIS:   A. SOFT TISSUE MASS, RIGHT UPPER CHEST WALL, NEEDLE CORE BIOPSY:  - Invasive ductal carcinoma, see comment  - Ductal carcinoma in situ: Not identified   - Tubule formation: Score 3  - Nuclear pleomorphism: Score 3  - Mitotic count: Score 3  - Total score: 9  - Overall Grade: 3  - Lymphovascular invasion: Not identified  - Cancer Length: 13 mm  -  Calcifications: Not identified  - Other findings: None    03/28/2022 Cancer Staging   Staging form: Breast, AJCC 8th Edition - Clinical stage from 03/28/2022: Stage IV (cT4d, cN3c, pM1, G3, ER+, PR-, HER2: Equivocal) - Signed by Artis Delay, MD on 03/28/2022 Stage prefix: Initial diagnosis Nuclear grade: G3 Histologic grading system: 3 grade system   04/17/2022 -  Chemotherapy   She started taking combination of abemaciclib with anastrozole   06/27/2022 Imaging   1. Ulcerated mass of the right breast with extension of nodular enhancing soft tissue to the right chest wall slight extension into the contralateral left breast, markedly decreased in size when compared with prior exam. 2. Interval decreased size of left axillary lymph node. 3. Solid pulmonary nodules are decreased in size when compared with prior exam. 4. Large right pleural effusion with complete collapse of the right lower lobe, pleural effusion is decreased in size when compared with prior exam. 5. Stable low-attenuation liver lesions which are likely simple cysts. 6. Lytic and sclerotic osseous lesions again seen, many  of which demonstrate increased sclerosis, likely due to treatment response. 7. Pathologic compression fracture of T3 demonstrates increased height loss, although there is no evidence of significant retropulsion. 8. Large stool ball in the rectum, correlate for constipation.     08/02/2022 - 08/30/2022 Hospital Admission   She was admitted to the hospital due to malignant pleural effusion and was diagnosed with MSSA empyema   08/10/2022 Imaging   1. Large loculated right hydropneumothorax encompassing about 75% of right hemithoracic volume. Pigtail right pleural drainage catheter in place. 2. Moderate to large left pleural effusion encompassing about 1/3 of the left hemithoracic volume. 3. Right axillary mass or lymph node measures 1.8 cm in short axis, formerly 1.9 cm. Other previously seen chest wall soft tissue  lesions are less conspicuous on today's exam due to lack of IV contrast, but likely still present. 4. Bony destructive findings of the right fifth rib laterally compatible with metastatic disease. 5. Extensive sclerosis in the sternum favoring metastatic disease over infection. 6. Nearly complete central collapse of the T3 vertebral body with associated sclerosis and minimal posterior bony retropulsion, unchanged from prior. 7. Diffuse hazy edema/infiltration of the mediastinal and subcutaneous adipose tissues compatible with third spacing of fluid. 8. Aortic atherosclerosis.     01/13/2023 Imaging   CT CHEST ABDOMEN PELVIS W CONTRAST  Result Date: 01/12/2023 CLINICAL DATA:  Stage IV invasive breast cancer. Ongoing chemotherapy. Assess treatment response. * Tracking Code: BO * EXAM: CT CHEST, ABDOMEN, AND PELVIS WITH CONTRAST TECHNIQUE: Multidetector CT imaging of the chest, abdomen and pelvis was performed following the standard protocol during bolus administration of intravenous contrast. RADIATION DOSE REDUCTION: This exam was performed according to the departmental dose-optimization program which includes automated exposure control, adjustment of the mA and/or kV according to patient size and/or use of iterative reconstruction technique. CONTRAST:  OMNIPAQUE IOHEXOL 300 MG/ML  SOLN COMPARISON:  CT chest 08/10/2022. Chest abdomen pelvis CT 06/25/2022. Numerous prior x-rays FINDINGS: CT CHEST FINDINGS Cardiovascular: Right side of the heart is slightly enlarged. No pericardial effusion the thoracic aorta has a normal course and caliber. Mild vascular calcifications. Mediastinum/Nodes: Slightly patulous thoracic esophagus. Preserved thyroid gland. No abnormal lymph node enlargement seen in the left axillary region, left chest wall, left axillary region or mediastinum small right hilar node on image 33 of series three, nonpathologic by size criteria slightly more prominent than usually seen. This was  not as well seen on the prior CT scan of November 2023 but the study was without contrast. On the older study with contrast from 06/25/2022 this node was present and stable. Enlarged right axillary lymph nodes identified. On the study of November 2023 a node was measured at 18 by 28 mm and today this same node measures 2.7 x 1.5 cm on series 3, image 29. Lungs/Pleura: Left basilar atelectasis. No pneumothorax or effusion. No dominant left-sided lung nodule. There is some volume loss along the right hemithorax with a small effusion and pleural thickening. Small loculated pleural effusion at the right lateral lung base as well such as series 3, image 47 measuring 7.2 by 2.6 cm. Adjacent lung opacity. There is a nodule in the right lower lobe which is noncalcified on series 7, image 74 measuring 5 mm. This area was obscured by a more significant opacity on the prior CT scans of the larger effusion and pleural drain. Attention on follow-up. Is also a spiculated nodule right upper lobe today on series 7, image 32 measuring 7 by 4 mm. In  retrospect this nodule was present in November 2023 measuring 6 3 mm. In September 2023 this measured 4 mm. Slowly increasing in size. Musculoskeletal: Curvature of the spine with some degenerative changes. There is areas of sclerosis seen along several vertebral bodies, the sternum and ribs with some lytic areas consistent with bone metastases. There is severe compression of the T3 level which is unchanged from previous. The areas of sclerosis involving T4 level in the sternum appears similar. There is diffuse anasarca with several soft tissue nodules identified in the right chest wall including dermal, subdermal along muscular. Many these areas are significantly increased. Example soft tissue nodule anteriorly on series 3, image 34 extending out to the skin measures 19 by 19 mm today and previously small amount of thickening would have measured up to 5 mm in thickness. Again several  other areas are significantly increased. Previous right mastectomy. CT ABDOMEN PELVIS FINDINGS Hepatobiliary: Multiple hepatic cysts are again identified. Gallbladder is present. Patent portal vein. Pancreas: Unremarkable. No pancreatic ductal dilatation or surrounding inflammatory changes. Spleen: Normal in size without focal abnormality. Adrenals/Urinary Tract: The adrenal glands are preserved. Upper pole left-sided Bosniak 1 renal cyst again seen measuring 2.7 cm in diameter. Hounsfield unit of 5. No specific imaging follow-up. Tiny focus elsewhere as well in the mid left kidney. No imaging follow-up. These are unchanged from previous. Preserved contours of the collapsed urinary bladder. Stomach/Bowel: Bowel is nondilated. Diffuse colonic stool. Cecum resides in the right hemipelvis. Presumed normal retrocecal air-filled appendix. Vascular/Lymphatic: Normal caliber aorta and IVC. No discrete abnormal lymph node enlargement identified in the abdomen and pelvis. Reproductive: Uterus and bilateral adnexa are unremarkable. Other: Anasarca.  No free air or free fluid identified. Musculoskeletal: Curvature of the spine. Multifocal degenerative changes of the spine and pelvis there is also lytic and sclerotic bone lesions identified along the pelvis and lumbar spine. Distribution by CT appears similar to the previous. Schmorl's node compression deformity again seen at L4. IMPRESSION: Worsening disease with multiple areas of nodular skin thickening along the right hemithorax and chest wall with additional underlying subcutaneous fat and muscular nodules. Persistent axillary lymph nodes. Small right pleural effusion seen with a loculated component on the right side of the lung base. Adjacent lung opacities. Small right-sided lung nodules identified which were not as well seen on the prior, possibly due to the level of the lung opacity but are felt to be increasing slightly. Attention on short follow-up. Mixed lucent and  sclerotic bone lesions are again identified with stable compression of T3. Electronically Signed   By: Karen Kays M.D.   On: 01/12/2023 15:09      03/19/2023 Imaging   CT CHEST ABDOMEN PELVIS W CONTRAST  Result Date: 03/19/2023 CLINICAL DATA:  Stage IV breast cancer. Restaging. * Tracking Code: BO * EXAM: CT CHEST, ABDOMEN, AND PELVIS WITH CONTRAST TECHNIQUE: Multidetector CT imaging of the chest, abdomen and pelvis was performed following the standard protocol during bolus administration of intravenous contrast. RADIATION DOSE REDUCTION: This exam was performed according to the departmental dose-optimization program which includes automated exposure control, adjustment of the mA and/or kV according to patient size and/or use of iterative reconstruction technique. CONTRAST:  OMNIPAQUE IOHEXOL 300 MG/ML  SOLN COMPARISON:  01/09/2023 FINDINGS: CT CHEST FINDINGS Cardiovascular: The heart size is upper normal to borderline enlarged. No substantial pericardial effusion. Mediastinum/Nodes: No mediastinal lymphadenopathy. Small hilar lymph nodes evident bilaterally. The esophagus has normal imaging features. No left axillary lymphadenopathy. Lungs/Pleura: Volume loss with architectural  distortion and pleuroparenchymal scarring in the inferior right lung is similar to prior. 7 mm right apical nodule identified previously is unchanged on 30/4 today. 5 mm nodule in the medial right base identified previously is stable on 76/4. Interval development of masslike consolidative opacity in the posterior left lower lobe (120/4) suspicious for pneumonia given lack of appreciable volume loss in the left lower lobe. Right pleural effusion is similar to prior although measured loculated component on the previous study is 7.2 x 2.6 cm is now 5.9 x 1.7 cm on 46/3. Musculoskeletal: The numerous right-sided predominant chest wall lesions are progressive in the interval. Index anterior right paramidline lesion measured  previously at 1.9 x 1.9 cm is 3.1 x 2.5 cm today on image 31/3. Skin thickening lateral lower chest wall is 8 mm today on image 52/3 which corresponds to 4 mm when measuring at the same level on the prior study. Chest wall edema noted right greater than left. Sclerotic lesion in the anterior right eighth rib is similar to prior. Marked compression deformity at T3 again noted. Sclerotic sternal lesion is similar to prior. CT ABDOMEN PELVIS FINDINGS Hepatobiliary: Multiple hepatic cysts are again noted. Capsular irregularity along the hemidiaphragm is similar to prior. There is no evidence for gallstones, gallbladder wall thickening, or pericholecystic fluid. No intrahepatic or extrahepatic biliary dilation. Pancreas: Interval development of diffuse distention of the main pancreatic duct measuring up to 4 mm diameter. No discernible obstructing mass lesion in the head of the pancreas. Spleen: No splenomegaly. No suspicious focal mass lesion. Adrenals/Urinary Tract: No adrenal nodule or mass. Opacification of the bilateral renal calices consistent with early excretion of contrast material. Small cyst noted upper pole left kidney, as before. No followup imaging is recommended. No evidence for hydroureter. The urinary bladder appears normal for the degree of distention. Stomach/Bowel: Stomach is moderately distended with fluid. Mild diffuse distention of small bowel and colon noted with moderate to large stool volume evident. Vascular/Lymphatic: No abdominal aortic aneurysm. There is no gastrohepatic or hepatoduodenal ligament lymphadenopathy. No retroperitoneal or mesenteric lymphadenopathy. No pelvic sidewall lymphadenopathy. 10 mm short axis right groin node on 125/3 is upper normal for size. Reproductive: Unremarkable. Other: No substantial intraperitoneal free fluid. Musculoskeletal: Similar appearance of the lesion in the right pubic bone. Lucent lesion in the left L4 vertebral body is similar to prior. IMPRESSION:  1. Interval progression of the numerous right-sided predominant chest wall lesions. 2. Interval development of masslike consolidative opacity in the posterior left lower lobe suspicious for pneumonia given lack of appreciable volume loss in the left lower lobe. 3. No interval change in index right-sided pulmonary nodules. Continued attention on follow-up suggested. 4. Interval development of diffuse distention of the main pancreatic duct measuring up to 4 mm diameter. No discernible obstructing mass lesion in the head of the pancreas. Close attention on follow-up recommended. 5. Similar appearance of the osseous metastatic disease. 6. No substantial change in right pleural effusion although measured loculated component on the previous study has decreased in the interval. 7. Mild diffuse distention of small bowel and colon with moderate to large stool volume. Correlation for signs/symptoms of clinical constipation suggested. Electronically Signed   By: Kennith Center M.D.   On: 03/19/2023 08:27      03/24/2023 -  Chemotherapy   She started on xeloda     PHYSICAL EXAMINATION: ECOG PERFORMANCE STATUS: 1 - Symptomatic but completely ambulatory  Vitals:   04/17/23 1403  BP: 120/75  Pulse: (!) 114  Resp:  18  SpO2: 97%   Filed Weights   04/17/23 1403  Weight: 134 lb 12.8 oz (61.1 kg)    GENERAL:alert, no distress and comfortable NEURO: alert & oriented x 3 with fluent speech, no focal motor/sensory deficits  LABORATORY DATA:  I have reviewed the data as listed    Component Value Date/Time   NA 140 04/17/2023 1338   K 3.8 04/17/2023 1338   CL 104 04/17/2023 1338   CO2 30 04/17/2023 1338   GLUCOSE 106 (H) 04/17/2023 1338   BUN 12 04/17/2023 1338   CREATININE 0.89 04/17/2023 1338   CREATININE 1.03 (H) 11/28/2022 1242   CALCIUM 9.2 04/17/2023 1338   PROT 6.9 04/17/2023 1338   ALBUMIN 3.5 04/17/2023 1338   AST 41 04/17/2023 1338   AST 22 11/28/2022 1242   ALT 9 04/17/2023 1338   ALT 13  11/28/2022 1242   ALKPHOS 57 04/17/2023 1338   BILITOT 0.5 04/17/2023 1338   BILITOT 0.5 11/28/2022 1242   GFRNONAA >60 04/17/2023 1338   GFRNONAA >60 11/28/2022 1242    No results found for: "SPEP", "UPEP"  Lab Results  Component Value Date   WBC 8.9 04/17/2023   NEUTROABS 6.8 04/17/2023   HGB 10.2 (L) 04/17/2023   HCT 31.3 (L) 04/17/2023   MCV 84.8 04/17/2023   PLT 475 (H) 04/17/2023      Chemistry      Component Value Date/Time   NA 140 04/17/2023 1338   K 3.8 04/17/2023 1338   CL 104 04/17/2023 1338   CO2 30 04/17/2023 1338   BUN 12 04/17/2023 1338   CREATININE 0.89 04/17/2023 1338   CREATININE 1.03 (H) 11/28/2022 1242      Component Value Date/Time   CALCIUM 9.2 04/17/2023 1338   ALKPHOS 57 04/17/2023 1338   AST 41 04/17/2023 1338   AST 22 11/28/2022 1242   ALT 9 04/17/2023 1338   ALT 13 11/28/2022 1242   BILITOT 0.5 04/17/2023 1338   BILITOT 0.5 11/28/2022 1242

## 2023-04-17 NOTE — Assessment & Plan Note (Signed)
Wound care appointment is pending She will finish her current prescription of Keflex and then use metrogel

## 2023-04-17 NOTE — Assessment & Plan Note (Signed)
This is likely due to recent treatment. The patient denies recent history of bleeding such as epistaxis, hematuria or hematochezia. She is asymptomatic from the anemia. I will observe for now.  She does not require transfusion now. I will continue the chemotherapy at current dose without dosage adjustment.  If the anemia gets progressive worse in the future, I might have to delay her treatment or adjust the chemotherapy dose. I plan to increase the xeloda dose next cycle

## 2023-04-17 NOTE — Progress Notes (Signed)
Per Dr. Bertis Ruddy - patient to receive Xgeva today. Appointment made and Jonny Ruiz, CMA to administer.

## 2023-04-17 NOTE — Assessment & Plan Note (Signed)
She will receive X geva today We discussed importance of calcium and vitamin D

## 2023-04-17 NOTE — Assessment & Plan Note (Signed)
She has limited knowledge and is not taking her medications correctly I have asked my nursing staff to help put her medications into her pill boxes

## 2023-04-20 ENCOUNTER — Other Ambulatory Visit (HOSPITAL_COMMUNITY): Payer: Self-pay

## 2023-04-21 ENCOUNTER — Telehealth: Payer: Self-pay

## 2023-04-21 NOTE — Telephone Encounter (Signed)
[name] called and left a message. She purchased 2 more pill planners and was going to bring the medications to the office yesterday. Carlyon did not want [name] to bring her medications to be put in the pill planner. Lanyiah told her Mom she would do it herself and needs to start doing it from now on.  FYI

## 2023-04-22 ENCOUNTER — Other Ambulatory Visit (HOSPITAL_COMMUNITY): Payer: Self-pay

## 2023-04-22 ENCOUNTER — Encounter (HOSPITAL_BASED_OUTPATIENT_CLINIC_OR_DEPARTMENT_OTHER): Payer: BC Managed Care – PPO | Attending: Physician Assistant | Admitting: General Surgery

## 2023-04-22 ENCOUNTER — Encounter: Payer: Self-pay | Admitting: Hematology and Oncology

## 2023-04-22 DIAGNOSIS — K219 Gastro-esophageal reflux disease without esophagitis: Secondary | ICD-10-CM | POA: Insufficient documentation

## 2023-04-22 DIAGNOSIS — D6481 Anemia due to antineoplastic chemotherapy: Secondary | ICD-10-CM | POA: Insufficient documentation

## 2023-04-22 DIAGNOSIS — L98499 Non-pressure chronic ulcer of skin of other sites with unspecified severity: Secondary | ICD-10-CM | POA: Insufficient documentation

## 2023-04-22 DIAGNOSIS — Z9221 Personal history of antineoplastic chemotherapy: Secondary | ICD-10-CM | POA: Diagnosis not present

## 2023-04-22 DIAGNOSIS — C7951 Secondary malignant neoplasm of bone: Secondary | ICD-10-CM | POA: Diagnosis not present

## 2023-04-22 DIAGNOSIS — C50911 Malignant neoplasm of unspecified site of right female breast: Secondary | ICD-10-CM | POA: Insufficient documentation

## 2023-04-22 DIAGNOSIS — I10 Essential (primary) hypertension: Secondary | ICD-10-CM | POA: Diagnosis not present

## 2023-04-22 DIAGNOSIS — E46 Unspecified protein-calorie malnutrition: Secondary | ICD-10-CM | POA: Diagnosis not present

## 2023-04-22 MED ORDER — DAKINS (1/4 STRENGTH) 0.125 % EX SOLN
CUTANEOUS | 6 refills | Status: DC
  Filled 2023-04-22: qty 946, 23d supply, fill #0

## 2023-04-22 NOTE — Progress Notes (Signed)
NYCOLE, KAWAHARA ([id number]) [id number] Page 1 of 4 Visit Report for 04/22/2023 Abuse Risk Screen Details Patient Name: Date of Service: [name], [name] 04/22/2023 9:00 A M Medical Record Number: [id number] Patient Account Number: [id number] Date of Birth/Sex: Treating RN: Feb 28, 1961 (62 y.o. [name] Primary Care [name]: [name] Other Clinician: Referring [name]: Treating [name]/Extender: [name], [name]: 0 Abuse Risk Screen Items Answer ABUSE RISK SCREEN: Has anyone close to you tried to hurt or harm you recentlyo No Do you feel uncomfortable with anyone in your familyo No Has anyone forced you do things that you didnt want to doo No Electronic Signature(s) Signed: 04/22/2023 5:09:00 PM By: Wendy Deed RN, BSN Entered By: [name] on 04/22/2023 09:29:41 -------------------------------------------------------------------------------- Activities of Daily Living Details Patient Name: Date of Service: [name], [name] 04/22/2023 9:00 A M Medical Record Number: [id number] Patient Account Number: [id number] Date of Birth/Sex: Treating RN: 01-Aug-1961 (62 y.o. [name] Primary Care [name]: [name] Other Clinician: Referring [name]: Treating [name]/Extender: [name], [name]: 0 Activities of Daily Living Items Answer Activities of Daily Living (Please select one for each item) Drive Automobile Completely Able T Medications ake Completely Able Use T elephone Completely Able Care for Appearance Completely Able Use T oilet Completely Able Bath / Shower Completely Able Dress Self Completely Able Feed Self Completely Able Walk Completely Able Get In / Out Bed Completely Able Housework Completely Able Prepare Meals Completely Able Handle Money Completely Able Shop for Self Completely Able Electronic Signature(s) Signed: 04/22/2023  5:09:00 PM By: Wendy Deed RN, BSN Entered By: [name] on 04/22/2023 09:30:02 [name] ([id number]) [phone] [id number] Page 2 of 4 -------------------------------------------------------------------------------- Education Screening Details Patient Name: Date of Service: [name], [name] 04/22/2023 9:00 A M Medical Record Number: [id number] Patient Account Number: [id number] Date of Birth/Sex: Treating RN: June 12, 1961 (62 y.o. [name] Primary Care Veto Macqueen: [name] Other Clinician: Referring [name]: Treating [name]/Extender: [name] Weeks in Burch: 0 Primary Learner Assessed: Patient Learning Preferences/Education Level/Primary Language Learning Preference: Explanation, Demonstration, Printed Material Highest Education Level: College or Above Preferred Language: English Cognitive Barrier Language Barrier: No Translator Needed: No Memory Deficit: No Emotional Barrier: No Cultural/Religious Beliefs Affecting Medical Care: No Physical Barrier Impaired Vision: No Impaired Hearing: No Decreased Hand dexterity: No Knowledge/Comprehension Knowledge Level: High Comprehension Level: High Ability to understand written instructions: High Ability to understand verbal instructions: High Motivation Anxiety Level: Calm Cooperation: Cooperative Education Importance: Acknowledges Need Interest in Health Problems: Asks Questions Perception: Coherent Willingness to Engage in Self-Management High Activities: Readiness to Engage in Self-Management High Activities: Electronic Signature(s) Signed: 04/22/2023 5:09:00 PM By: Wendy Deed RN, BSN Entered By: [name] on 04/22/2023 09:30:26 -------------------------------------------------------------------------------- Fall Risk Assessment Details Patient Name: Date of Service: [name], [name] 04/22/2023 9:00 A M Medical Record Number:  [id number] Patient Account Number: [id number] Date of Birth/Sex: Treating RN: 08/30/1961 (62 y.o. [name] Primary Care [name]: [name] Other Clinician: Referring [name]: Treating [name]/Extender: [name], [name]: 0 Fall Risk Assessment Items Have you had 2 or more falls in the last 12 monthso 0 No [name] ([id number]) [phone] [id number] Page 3 of 4 Have you had any fall that resulted in injury in the last 12 monthso 0 No FALLS RISK SCREEN History of falling - immediate or within 3 months 0 No Secondary diagnosis (Do you have 2 or more medical diagnoseso) 0 No Ambulatory  aid None/bed rest/wheelchair/nurse 0 Yes Crutches/cane/walker 0 No Furniture 0 No Intravenous therapy Access/Saline/Heparin Lock 0 No Gait/Transferring Normal/ bed rest/ wheelchair 0 Yes Weak (short steps with or without shuffle, stooped but able to lift head while walking, may seek 0 No support from furniture) Impaired (short steps with shuffle, may have difficulty arising from chair, head down, impaired 0 No balance) Mental Status Oriented to own ability 0 Yes Electronic Signature(s) Signed: 04/22/2023 5:09:00 PM By: Wendy Deed RN, BSN Entered By: [name] on 04/22/2023 09:30:41 -------------------------------------------------------------------------------- Foot Assessment Details Patient Name: Date of Service: [name], [name] 04/22/2023 9:00 A M Medical Record Number: [id number] Patient Account Number: [id number] Date of Birth/Sex: Treating RN: 07-06-1961 (61 y.o. [name] Primary Care [name]: [name] Other Clinician: Referring [name]: Treating [name]/Extender: [name], [name]: 0 Foot Assessment Items Site Locations + = Sensation present, - = Sensation absent, C = Callus, U = Ulcer R = Redness, W = Warmth, M = Maceration, PU = Pre-ulcerative lesion F =  Fissure, S = Swelling, D = Dryness Assessment Right: Left: Other Deformity: No No Prior Foot Ulcer: No No Prior Amputation: No No Charcot Joint: No No Ambulatory Status: Ambulatory Without Help GaitNICCI, [name] ([id number]) [phone].pdf Page 4 of 4 Electronic Signature(s) Signed: 04/22/2023 5:09:00 PM By: Wendy Deed RN, BSN Entered By: [name] on 04/22/2023 09:32:53 -------------------------------------------------------------------------------- Nutrition Risk Screening Details Patient Name: Date of Service: [name], [name] 04/22/2023 9:00 A M Medical Record Number: [id number] Patient Account Number: [id number] Date of Birth/Sex: Treating RN: [birth year] (63 y.o. [name] Primary Care Harve Spradley: [name] Other Clinician: Referring Malayna Noori: Treating Darby Shadwick/Extender: [name], [name]: 0 Height (in): 66 Weight (lbs): 137 Body Mass Index (BMI): 22.1 Nutrition Risk Screening Items Score Screening NUTRITION RISK SCREEN: I have an illness or condition that made me change the kind and/or amount of food I eat 2 Yes I eat fewer than two meals per day 0 No I eat few fruits and vegetables, or milk products 0 No I have three or more drinks of beer, liquor or wine almost every day 0 No I have tooth or mouth problems that make it hard for me to eat 0 No I don't always have enough money to buy the food I need 0 No I eat alone most of the time 1 Yes I take three or more different prescribed or over-the-counter drugs a day 1 Yes Without wanting to, I have lost or gained 10 pounds in the last six months 0 No I am not always physically able to shop, cook and/or feed myself 0 No Nutrition Protocols Good Risk Protocol Moderate Risk Protocol 0 Provide education on nutrition High Risk Proctocol Risk Level: Moderate Risk Score: 4 Electronic Signature(s) Signed: 04/22/2023 5:09:00 PM By:  Wendy Deed RN, BSN Entered By: [name] on 04/22/2023 09:32:45

## 2023-04-22 NOTE — Progress Notes (Addendum)
LYNITA, GROSECLOSE ([id number]) [id number] Page 1 of 4 Visit Report for 04/22/2023 Chief Complaint Document Details Patient Name: Date of Service: [name], [name] 04/22/2023 9:00 A M Medical Record Number: [id number] Patient Account Number: [id number] Date of Birth/Sex: Treating RN: 1960-10-13 (62 y.o. Female) Primary Care Provider: Artis Delay Other Clinician: Referring Provider: Treating Provider/Extender: Vassie Moselle, Ni Weeks in Treatment: 0 Information Obtained from: Patient Chief Complaint Patient seen for complaints of Non-Healing Wound. Electronic Signature(s) Signed: 04/22/2023 9:21:48 AM By: Duanne Guess MD FACS Entered By: Duanne Guess on 04/22/2023 09:21:47 -------------------------------------------------------------------------------- HPI Details Patient Name: Date of Service: [name], [name] 04/22/2023 9:00 A M Medical Record Number: [id number] Patient Account Number: [id number] Date of Birth/Sex: Treating RN: [birth year] (62 y.o. 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 She was diagnosed in June 2023. She had a fungating right breast lesion. She also had a malignant pleural effusion. She has been treated with chemotherapy. Apparently the breast wound responded well to treatment, along with antibiotic therapy. In January, however, the chest wall reopened. She has very poor nutritional status and struggles to get adequate protein calorie intake for wound healing. She had follow-up with her oncologist on April 07, 2023; She was referred to the wound care center for further management. Electronic Signature(s) Signed: 04/22/2023 9:33:59 AM By: Duanne Guess MD  FACS Previous Signature: 04/22/2023 9:29:28 AM Version By: Duanne Guess MD FACS Entered By: Duanne Guess on 04/22/2023 09:33:59 -------------------------------------------------------------------------------- Problem List Details Patient Name: Date of Service: [name], [name] 04/22/2023 9:00 A M Medical Record Number: [id number] Patient Account Number: [id number] Date of Birth/Sex: Treating RN: 1961-03-20 (62 y.o. 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 04/22/2023 No Yes C50.911 Malignant neoplasm of unspecified site of right female breast 04/22/2023 No Yes C79.51 Secondary malignant neoplasm of bone 04/22/2023 No Yes E46 Unspecified protein-calorie malnutrition 04/22/2023 No Yes D64.81 Anemia due to antineoplastic chemotherapy 04/22/2023 No Yes Inactive Problems Resolved Problems Electronic Signature(s) Signed: 04/22/2023 9:21:35 AM By: Duanne Guess MD FACS Entered By: Duanne Guess on 04/22/2023 09:21:35 -------------------------------------------------------------------------------- HxROS Details Patient Name: Date of Service: [name], [name] 04/22/2023 9:00 A M Medical Record Number: [id number] Patient Account Number: [id number] Date of Birth/Sex: Treating RN: 04/25/1961 (62 y.o. Female) Zenaida Deed Primary Care Provider: Artis Delay Other Clinician: Referring Provider: Treating Provider/Extender: Vassie Moselle, Ni Weeks in Treatment: 0 Information Obtained From Patient Chart Constitutional Symptoms (General Health) Complaints and Symptoms: Negative for: Fatigue; Fever; Chills; Marked Weight  Change Eyes Complaints and Symptoms: Negative for: Dry Eyes; Vision Changes; Glasses / Contacts Medical History: Negative for: Cataracts; Glaucoma Ear/Nose/Mouth/Throat Complaints and Symptoms: Negative for: Chronic sinus problems or rhinitis Medical HistoryANASTAZJA, [name] ([id number]) [id number] Page 3 of 4 Negative for: Chronic sinus problems/congestion; Middle ear problems Respiratory Complaints and Symptoms: Negative for: Chronic or frequent coughs; Shortness of Breath Medical History: Past Medical History Notes: pleural effusion, hx empyema Cardiovascular Complaints and Symptoms: Negative for: Chest pain Medical History: Positive for: Hypertension Gastrointestinal Complaints and Symptoms: Negative for: Frequent diarrhea; Nausea; Vomiting Medical History: Past  Medical History Notes: GERD Endocrine Complaints and Symptoms: Negative for: Heat/cold intolerance Medical History: Negative for: Type I Diabetes; Type II Diabetes Genitourinary Complaints and Symptoms: Negative for: Frequent urination Medical History: Negative for: End Stage Renal Disease Integumentary (Skin) Complaints and Symptoms: Positive for: Wounds - right chest Medical History: Negative for: History of Burn Musculoskeletal Complaints and Symptoms: Negative for: Muscle Pain; Muscle Weakness Medical History: Negative for: Gout; Rheumatoid Arthritis; Osteoarthritis; Osteomyelitis Neurologic Complaints and Symptoms: Negative for: Numbness/parasthesias Psychiatric Complaints and Symptoms: Positive for: Claustrophobia Medical History: Positive for: Anorexia/bulimia - poor appetite; Confinement Anxiety Hematologic/Lymphatic Medical History: Positive for: Anemia - microcytic Immunological Oncologic [name], [name] ([id number]) [id number] Page 4 of 4 Medical History: Positive for: Received Chemotherapy - oral Negative for: Received  Radiation Past Medical History Notes: breast CA Immunizations Pneumococcal Vaccine: Received Pneumococcal Vaccination: Yes Received Pneumococcal Vaccination On or After 60th Birthday: Yes Implantable Devices No devices added Hospitalization / Surgery History Type of Hospitalization/Surgery thoracentesis 03/2022 ankle surgery Family and Social History Cancer: No; Diabetes: Yes - Father; Heart Disease: Yes - Father; Hereditary Spherocytosis: No; Hypertension: Yes - Maternal Grandparents,Mother,Siblings; Kidney Disease: Yes - Father; Lung Disease: No; Seizures: No; Stroke: Yes - Father; Thyroid Problems: No; Tuberculosis: No; Never smoker; Marital Status - Single; Alcohol Use: Never; Drug Use: No History; Caffeine Use: Daily - coffee; Financial Concerns: No; Food, Clothing or Shelter Needs: No; Support System Lacking: No; Transportation Concerns: No Electronic Signature(s) Unsigned Entered By: Zenaida Deed on 04/22/2023 09:29:33 Signature(s): Date(s):

## 2023-04-22 NOTE — Progress Notes (Addendum)
SHANTA, CALVEY ([id number]) [id number] Page 1 of 8 Visit Report for 04/22/2023 Allergy List Details Patient Name: Date of Service: [name], [name] 04/22/2023 9:00 A M Medical Record Number: [id number] Patient Account Number: [id number] Date of Birth/Sex: Treating RN: 1961/02/23 (62 y.o. [name] Finn Altemose: [name] Burch Other Clinician: Referring Montrail Mehrer: Treating Dorotha Hirschi/Extender: Vassie Moselle, Ni Weeks in Treatment: 0 Allergies Active Allergies No Known Allergies Allergy Notes Electronic Signature(s) Signed: 04/22/2023 5:09:00 PM By: Zenaida Deed RN, BSN Entered By: Zenaida Deed on 04/22/2023 06:23:07 -------------------------------------------------------------------------------- Arrival Information Details Patient Name: Date of Service: Wendy [name], [name] 04/22/2023 9:00 A M Medical Record Number: [id number] Patient Account Number: [id number] Date of Birth/Sex: Treating RN: 1960-10-08 (62 y.o. [name] [name]: [name] Burch Other Clinician: Referring [name]: Treating [name]/Extender: Vassie Moselle, Ni Weeks in Treatment: 0 Visit Information Patient Arrived: Ambulatory Arrival Time: 09:14 Accompanied By: mother Transfer Assistance: None Patient Identification Verified: Yes Secondary Verification Process Completed: Yes Patient Requires Transmission-Based Precautions: No Patient Has Alerts: No Electronic Signature(s) Signed: 04/22/2023 5:09:00 PM By: Zenaida Deed RN, BSN Entered By: Zenaida Deed on 04/22/2023 06:21:23 -------------------------------------------------------------------------------- Clinic Level of Burch Assessment Details Patient Name: Date of Service: Wendy [name], [name] 04/22/2023 9:00 A M Medical Record Number: [id number] Patient Account Number: [id number] TAMBERLYN, [name] ([id number]) [id number] Page 2 of 8 Date of  Birth/Sex: Treating RN: 1960/12/06 (62 y.o. F) Primary Burch [name]: Other Clinician: Artis Burch Referring [name]: Treating [name]/Extender: Vassie Moselle, Ni Weeks in Treatment: 0 Clinic Level of Burch Assessment Items TOOL 1 Quantity Score X- 1 0 Use when EandM and Procedure is performed on INITIAL visit ASSESSMENTS - Nursing Assessment / Reassessment X- 1 20 General Physical Exam (combine w/ comprehensive assessment (listed just below) when performed on new pt. evals) X- 1 25 Comprehensive Assessment (HX, ROS, Risk Assessments, Wounds Hx, etc.) ASSESSMENTS - Wound and Skin Assessment / Reassessment []  - 0 Dermatologic / Skin Assessment (not related to wound area) ASSESSMENTS - Ostomy and/or Continence Assessment and Burch []  - 0 Incontinence Assessment and Management []  - 0 Ostomy Burch Assessment and Management (repouching, etc.) PROCESS - Coordination of Burch X - Simple Patient / Family Education for ongoing Burch 1 15 []  - 0 Complex (extensive) Patient / Family Education for ongoing Burch X- 1 10 Staff obtains Chiropractor, Records, T Results / Process Orders est []  - 0 Staff telephones HHA, Nursing Homes / Clarify orders / etc []  - 0 Routine Transfer to another Facility (non-emergent condition) []  - 0 Routine Hospital Admission (non-emergent condition) X- 1 15 New Admissions / Manufacturing engineer / Ordering NPWT Apligraf, etc. , []  - 0 Emergency Hospital Admission (emergent condition) PROCESS - Special Needs []  - 0 Pediatric / Minor Patient Management []  - 0 Isolation Patient Management []  - 0 Hearing / Language / Visual special needs []  - 0 Assessment of Community assistance (transportation, D/C planning, etc.) []  - 0 Additional assistance / Altered mentation []  - 0 Support Surface(s) Assessment (bed, cushion, seat, etc.) INTERVENTIONS - Miscellaneous []  - 0 External ear exam []  - 0 Patient Transfer (multiple staff / Nurse, adult / Similar  devices) []  - 0 Simple Staple / Suture removal (25 or less) []  - 0 Complex Staple / Suture removal (26 or more) []  - 0 Hypo/Hyperglycemic Management (do not check if billed separately) []  - 0 Ankle / Brachial Index (ABI) - do not check if billed separately Has the patient been seen at the hospital within the  last three years: Yes Total Score: 85 Level Of Burch: New/Established - Level 3 Electronic Signature(s) Signed: 06/03/2023 9:24:15 AM By: Pearletha Alfred Entered By: Pearletha Alfred on 04/23/2023 12:07:00 Deirdre Priest ([id number]) [id number] Page 3 of 8 -------------------------------------------------------------------------------- Complex / Palliative Patient Assessment Details Patient Name: Date of Service: [name], [name] 04/22/2023 9:00 A M Medical Record Number: [id number] Patient Account Number: [id number] Date of Birth/Sex: Treating RN: [birth year] (62 y.o. [name] [name]: [name] Burch Other Clinician: Referring [name]: Treating [name]/Extender: Vassie Moselle, Ni Weeks in Treatment: 0 Complex Wound Management Criteria Palliative Wound Management Criteria Patient has a terminal condition and Advanced Wound Management would negatively impact the patient's quality of life. metastatic breast cancer stage 4 Burch Approach Wound Burch Plan: Palliative Wound Management Electronic Signature(s) Signed: 04/22/2023 11:23:26 AM By: Duanne Guess MD FACS Signed: 04/22/2023 5:09:00 PM By: Zenaida Deed RN, BSN Entered By: Zenaida Deed on 04/22/2023 07:07:00 -------------------------------------------------------------------------------- Encounter Discharge Information Details Patient Name: Date of Service: [name], [name] 04/22/2023 9:00 A M Medical Record Number: [id number] Patient Account Number: [id number] Date of Birth/Sex: Treating RN: 1960/12/09 (62 y.o. [name] [name]: [name] Burch  Other Clinician: Referring [name]: Treating [name]/Extender: Vassie Moselle, Ni Weeks in Treatment: 0 Encounter Discharge Information Items Post Procedure Vitals Discharge Condition: Stable Temperature (F): 98.6 Ambulatory Status: Ambulatory Pulse (bpm): 130 Discharge Destination: Home Respiratory Rate (breaths/min): 20 Transportation: Private Auto Blood Pressure (mmHg): 119/82 Accompanied By: mother Schedule Follow-up Appointment: Yes Clinical Summary of Burch: Patient Declined Electronic Signature(s) Signed: 04/22/2023 5:09:00 PM By: Zenaida Deed RN, BSN Entered By: Zenaida Deed on 04/22/2023 07:55:02 -------------------------------------------------------------------------------- Lower Extremity Assessment Details Patient Name: Date of Service: Wendy [name], [name] 04/22/2023 9:00 A M Medical Record Number: [id number] Patient Account Number: [id number] Date of Birth/Sex: Treating RN: 1961/09/10 (62 y.o. [name] Belicia Difatta: [name] Burch Other Clinician: Referring Kelita Wallis: Treating Khalib Fendley/Extender: Vassie Moselle, Ni Weeks in Treatment: 0 Electronic Signature(s) Signed: 04/22/2023 5:09:00 PM By: Zenaida Deed RN, BSN [place], [place] [phone] By: Zenaida Deed RN, BSN [phone].pdf Page 4 of 8 Signed: 04/22/2023 5:09:00 Entered By: Zenaida Deed on 04/22/2023 06:32:59 -------------------------------------------------------------------------------- Multi Wound Chart Details Patient Name: Date of Service: DARCE, [name] 04/22/2023 9:00 A M Medical Record Number: [id number] Patient Account Number: [id number] Date of Birth/Sex: Treating RN: 26-Jan-1961 (62 y.o. F) Primary Burch Marius Betts: [name] Burch Other Clinician: Referring Aspynn Clover: Treating Cayden Granholm/Extender: Vassie Moselle, Ni Weeks in Treatment: 0 Vital Signs Height(in): 66 Pulse(bpm): 130 Weight(lbs): 137 Blood  Pressure(mmHg): 119/82 Body Mass Index(BMI): 22.1 Temperature(F): 98.6 Respiratory Rate(breaths/min): 18 [1:Photos:] [N/A:N/A] Right Chest N/A N/A Wound Location: Gradually Appeared N/A N/A Wounding Event: Malignant Wound N/A N/A Primary Etiology: Anemia, Hypertension, Received N/A N/A Comorbid History: Chemotherapy, Anorexia/bulimia, Confinement Anxiety 06/29/2022 N/A N/A Date Acquired: 0 N/A N/A Weeks of Treatment: Open N/A N/A Wound Status: No N/A N/A Wound Recurrence: 27x26.5x0.1 N/A N/A Measurements L x W x D (cm) 561.952 N/A N/A A (cm) : rea 56.195 N/A N/A Volume (cm) : Full Thickness Without Exposed N/A N/A Classification: Support Structures Medium N/A N/A Exudate A mount: Serosanguineous N/A N/A Exudate Type: red, brown N/A N/A Exudate Color: Yes N/A N/A Foul Odor A Cleansing: fter No N/A N/A Odor Anticipated Due to Product Use: Indistinct, nonvisible N/A N/A Wound Margin: Small (1-33%) N/A N/A Granulation A mount: Red, Friable N/A N/A Granulation Quality: Large (67-100%) N/A N/A Necrotic Amount: Eschar, Adherent Slough N/A N/A Necrotic Tissue: Fat Layer (Subcutaneous  Tissue): Yes N/A N/A Exposed Structures: Fascia: No Tendon: No Muscle: No Joint: No Bone: No Small (1-33%) N/A N/A Epithelialization: No Abnormalities Noted N/A N/A Periwound Skin Texture: No Abnormalities Noted N/A N/A Periwound Skin Moisture: Erythema: Yes N/A N/A Periwound Skin Color: No Abnormality N/A N/A Temperature: Yes N/A N/A Tenderness on Palpation: Treatment Notes MARIANITA, KIRCH ([id number]) [id number] Page 5 of 8 Electronic Signature(s) Signed: 04/22/2023 9:54:45 AM By: Duanne Guess MD FACS Entered By: Duanne Guess on 04/22/2023 06:54:45 -------------------------------------------------------------------------------- Multi-Disciplinary Burch Plan Details Patient Name: Date of Service: Wendy KRISTELLE, MENGHINI 04/22/2023 9:00 A  M Medical Record Number: [id number] Patient Account Number: [id number] Date of Birth/Sex: Treating RN: [birth year] (62 y.o. [name] Ervie Mccard: [name] Burch Other Clinician: Referring Tyric Rodeheaver: Treating Kieffer Blatz/Extender: Vassie Moselle, Ni Weeks in Treatment: 0 Active Inactive Malignancy/Atypical Etiology Nursing Diagnoses: Knowledge deficit related to disease process and management of malignancy Goals: Patient/caregiver will verbalize understanding of disease process and disease management of malignancy Date Initiated: 04/22/2023 Target Resolution Date: 05/20/2023 Goal Status: Active Interventions: Assess patient and family medical history for signs and symptoms of malignancy/atypical etiology upon admission Provide education on malignant ulcerations Notes: Wound/Skin Impairment Nursing Diagnoses: Impaired tissue integrity Knowledge deficit related to ulceration/compromised skin integrity Goals: Patient/caregiver will verbalize understanding of skin Burch regimen Date Initiated: 04/22/2023 Target Resolution Date: 05/20/2023 Goal Status: Active Interventions: Assess patient/caregiver ability to obtain necessary supplies Assess patient/caregiver ability to perform ulcer/skin Burch regimen upon admission and as needed Assess ulceration(s) every visit Treatment Activities: Skin Burch regimen initiated : 04/22/2023 Topical wound management initiated : 04/22/2023 Notes: Electronic Signature(s) Signed: 04/22/2023 5:09:00 PM By: Zenaida Deed RN, BSN Entered By: Zenaida Deed on 04/22/2023 07:06:10 Deirdre Priest ([id number]) [id number] Page 6 of 8 -------------------------------------------------------------------------------- Pain Assessment Details Patient Name: Date of Service: SHANIEKA, SABADOS 04/22/2023 9:00 A M Medical Record Number: [id number] Patient Account Number: [id number] Date of Birth/Sex: Treating RN: 09-29-1961  (62 y.o. [name] Kyshaun Barnette: [name] Burch Other Clinician: Referring Vartan Kerins: Treating Shatavia Santor/Extender: Vassie Moselle, Ni Weeks in Treatment: 0 Active Problems Location of Pain Severity and Description of Pain Patient Has Paino Yes Site Locations Pain Location: Pain in Ulcers With Dressing Change: Yes Duration of the Pain. Constant / Intermittento Constant Rate the pain. Current Pain Level: 8 Worst Pain Level: 10 Least Pain Level: 4 Character of Pain Describe the Pain: Aching, Tender Pain Management and Medication Current Pain Management: Medication: Yes Is the Current Pain Management Adequate: Adequate How does your wound impact your activities of daily livingo Sleep: Yes Bathing: No Appetite: Yes Relationship With Others: No Bladder Continence: No Emotions: Yes Bowel Continence: No Drive: Yes Toileting: No Hobbies: Yes Dressing: No Electronic Signature(s) Signed: 04/22/2023 5:09:00 PM By: Zenaida Deed RN, BSN Entered By: Zenaida Deed on 04/22/2023 07:25:05 -------------------------------------------------------------------------------- Patient/Caregiver Education Details Patient Name: Date of Service: [name], Kierre 7/24/2024andnbsp9:00 A M Medical Record Number: [id number] Patient Account Number: [id number] Date of Birth/Gender: Treating RN: 1961/04/14 (62 y.o. [name] Physician: [name] Burch Other Clinician: Referring Physician: Treating Physician/Extender: Zipporah Plants Weeks in Treatment: 0 Education Assessment Education Provided To: Patient AYRICA, CONNICK ([id number]) [id number] Page 7 of 8 Education Topics Provided Malignant/Atypical Wounds: Handouts: Malignant/Atypical Wounds Methods: Explain/Verbal, Printed Responses: State content correctly Pain: Handouts: A Guide to Pain Control Methods: Explain/Verbal, Printed Responses: Reinforcements  needed, State content correctly Wound/Skin Impairment: Handouts: Caring for Your Ulcer Methods: Explain/Verbal, Printed Responses: Reinforcements needed, State content correctly Electronic Signature(s)  Signed: 04/22/2023 5:09:00 PM By: Zenaida Deed RN, BSN Entered By: Zenaida Deed on 04/22/2023 07:04:07 -------------------------------------------------------------------------------- Wound Assessment Details Patient Name: Date of Service: [name], [name] 04/22/2023 9:00 A M Medical Record Number: [id number] Patient Account Number: [id number] Date of Birth/Sex: Treating RN: 07/14/1961 (62 y.o. Billy Coast, Linda Primary Burch April Carlyon: [name] Burch Other Clinician: Referring Thoren Hosang: Treating Delta Pichon/Extender: Vassie Moselle, Ni Weeks in Treatment: 0 Wound Status Wound Number: 1 Primary Malignant Wound Etiology: Wound Location: Right Chest Wound Open Wounding Event: Gradually Appeared Status: Date Acquired: 06/29/2022 Comorbid Anemia, Hypertension, Received Chemotherapy, Weeks Of Treatment: 0 History: Anorexia/bulimia, Confinement Anxiety Clustered Wound: No Photos Wound Measurements Length: (cm) 27 Width: (cm) 26.5 Depth: (cm) 0.1 Area: (cm) 561.952 Volume: (cm) 56.195 % Reduction in Area: % Reduction in Volume: Epithelialization: Small (1-33%) Tunneling: No Undermining: No Wound Description Classification: Full Thickness Without Exposed Support Structures Wound Margin: Indistinct, nonvisible Exudate Amount: Medium Exudate Type: Serosanguineous Exudate Color: red, brown Doshi, Marquasia ([id number]) Foul Odor After Cleansing: Yes Due to Product Use: No Slough/Fibrino Yes [id number] Page 8 of 8 Wound Bed Granulation Amount: Small (1-33%) Exposed Structure Granulation Quality: Red, Friable Fascia Exposed: No Necrotic Amount: Large (67-100%) Fat Layer (Subcutaneous Tissue) Exposed: Yes Necrotic Quality: Eschar, Adherent  Slough Tendon Exposed: No Muscle Exposed: No Joint Exposed: No Bone Exposed: No Periwound Skin Texture Texture Color No Abnormalities Noted: Yes No Abnormalities Noted: No Erythema: Yes Moisture No Abnormalities Noted: Yes Temperature / Pain Temperature: No Abnormality Tenderness on Palpation: Yes Electronic Signature(s) Signed: 04/22/2023 5:09:00 PM By: Zenaida Deed RN, BSN Entered By: Zenaida Deed on 04/22/2023 06:45:00 -------------------------------------------------------------------------------- Vitals Details Patient Name: Date of Service: [name], [name] 04/22/2023 9:00 A M Medical Record Number: [id number] Patient Account Number: [id number] Date of Birth/Sex: Treating RN: 06-07-1961 (63 y.o. [name] Theone Bowell: [name] Burch Other Clinician: Referring Lambros Cerro: Treating Benz Vandenberghe/Extender: Vassie Moselle, Ni Weeks in Treatment: 0 Vital Signs Time Taken: 09:21 Temperature (F): 98.6 Height (in): 66 Pulse (bpm): 130 Source: Stated Respiratory Rate (breaths/min): 18 Weight (lbs): 137 Blood Pressure (mmHg): 119/82 Source: Stated Reference Range: 80 - 120 mg / dl Body Mass Index (BMI): 22.1 Electronic Signature(s) Signed: 04/22/2023 5:09:00 PM By: Zenaida Deed RN, BSN Entered By: Zenaida Deed on 04/22/2023 06:22:00

## 2023-04-23 ENCOUNTER — Other Ambulatory Visit (HOSPITAL_COMMUNITY): Payer: Self-pay

## 2023-04-23 ENCOUNTER — Telehealth: Payer: Self-pay | Admitting: Hematology and Oncology

## 2023-04-23 NOTE — Telephone Encounter (Signed)
TC from [place] Pt's sister requesting to speak with Dr Bertis Ruddy. Informed Pt's sister that Dr. Bertis Ruddy was out of the office till 7/30 asked if another provider can help her she stated she has been speaking with Dr. Bertis Ruddy about matters with her sister and she pretty much knows what she is calling about. Informed her I will leave a message for when Dr Bertis Ruddy returns to return the call. Pt's sister verbalized understanding. Requested if she can call her on 7/30 or 7/31 after 2 pm. In basket message sent to Dr Bertis Ruddy.

## 2023-05-01 ENCOUNTER — Inpatient Hospital Stay: Payer: BC Managed Care – PPO | Admitting: Hematology and Oncology

## 2023-05-01 ENCOUNTER — Telehealth: Payer: Self-pay

## 2023-05-01 ENCOUNTER — Inpatient Hospital Stay: Payer: BC Managed Care – PPO

## 2023-05-01 NOTE — Telephone Encounter (Signed)
Called her back. She is still not feeling well. Appts canceled for today per her request.

## 2023-05-01 NOTE — Telephone Encounter (Signed)
Sent scheduling message to reschedule appts.

## 2023-05-01 NOTE — Telephone Encounter (Signed)
Returned her call. She has a upset stomach after eating something greasy. She just took some pepto bismol and will drink some ginger ale. She will call the office back around 1200 noon today regarding appt.

## 2023-05-04 ENCOUNTER — Encounter (HOSPITAL_BASED_OUTPATIENT_CLINIC_OR_DEPARTMENT_OTHER): Payer: BC Managed Care – PPO | Attending: General Surgery | Admitting: General Surgery

## 2023-05-04 ENCOUNTER — Telehealth: Payer: Self-pay

## 2023-05-04 DIAGNOSIS — C50911 Malignant neoplasm of unspecified site of right female breast: Secondary | ICD-10-CM | POA: Insufficient documentation

## 2023-05-04 DIAGNOSIS — Z833 Family history of diabetes mellitus: Secondary | ICD-10-CM | POA: Insufficient documentation

## 2023-05-04 DIAGNOSIS — I10 Essential (primary) hypertension: Secondary | ICD-10-CM | POA: Insufficient documentation

## 2023-05-04 DIAGNOSIS — L98499 Non-pressure chronic ulcer of skin of other sites with unspecified severity: Secondary | ICD-10-CM | POA: Diagnosis not present

## 2023-05-04 DIAGNOSIS — Z9221 Personal history of antineoplastic chemotherapy: Secondary | ICD-10-CM | POA: Insufficient documentation

## 2023-05-04 DIAGNOSIS — C7951 Secondary malignant neoplasm of bone: Secondary | ICD-10-CM | POA: Insufficient documentation

## 2023-05-04 DIAGNOSIS — E46 Unspecified protein-calorie malnutrition: Secondary | ICD-10-CM | POA: Insufficient documentation

## 2023-05-04 DIAGNOSIS — F419 Anxiety disorder, unspecified: Secondary | ICD-10-CM | POA: Diagnosis not present

## 2023-05-04 NOTE — Progress Notes (Addendum)
Color: No Abnormality N/A N/A Temperature: Yes N/A N/A Tenderness on Palpation: Treatment Notes Electronic Signature(s) Signed: 05/04/2023 12:17:13 PM By: Duanne Guess MD FACS Entered By: Duanne Guess on 05/04/2023 12:17:13 -------------------------------------------------------------------------------- Multi-Disciplinary Care Plan Details Patient Name: Date of Service: [name], [name] 05/04/2023 11:15 A M Medical Record Number: [id number] Patient Account Number: [id number] Date of Birth/Sex: Treating RN: 1961-09-05 (62 y.o. [name] Primary Care Lucill Mauck: Artis Delay Other Clinician: Referring Justice Aguirre: Treating Lux Skilton/Extender: Vassie Moselle, Ni Weeks in Treatment: 1 Active Inactive Electronic Signature(s) Signed: 06/29/2023 4:01:19 PM By: Zenaida Deed RN, BSN Previous Signature: 05/04/2023 5:40:23 PM Version By: Zenaida Deed RN, BSN Entered By: Zenaida Deed on 06/18/2023 11:16:55 -------------------------------------------------------------------------------- Pain Assessment Details Patient Name: Date of Service: [name], [name] 05/04/2023 11:15 A M Medical Record Number: [id number] Patient Account Number: [id number] Date of Birth/Sex: Treating RN: 1961/08/25 (62 y.o. [name] Primary Care Serina Nichter: Artis Delay Other Clinician: Referring Quashaun Lazalde: Treating Chrishonda Hesch/Extender: Zipporah Plants Peletier, [name] ([id number]) [id number] Page 5 of  7 Weeks in Treatment: 1 Active Problems Location of Pain Severity and Description of Pain Patient Has Paino Yes Site Locations Pain Location: Pain in Ulcers With Dressing Change: Yes Duration of the Pain. Constant / Intermittento Constant Rate the pain. Current Pain Level: 8 Worst Pain Level: 8 Least Pain Level: 7 Character of Pain Describe the Pain: Aching Pain Management and Medication Current Pain Management: Medication: Yes Is the Current Pain Management Adequate: Adequate How does your wound impact your activities of daily livingo Sleep: No Bathing: No Appetite: No Relationship With Others: No Bladder Continence: No Emotions: No Bowel Continence: No Work: No Toileting: No Drive: No Dressing: No Hobbies: No Electronic Signature(s) Signed: 05/04/2023 5:40:23 PM By: Zenaida Deed RN, BSN Entered By: Zenaida Deed on 05/04/2023 11:43:00 -------------------------------------------------------------------------------- Patient/Caregiver Education Details Patient Name: Date of Service: [name], [name] 8/5/2024andnbsp11:15 A M Medical Record Number: [id number] Patient Account Number: [id number] Date of Birth/Gender: Treating RN: [birth year] (62 y.o. [name] Primary Care Physician: Artis Delay Other Clinician: Referring Physician: Treating Physician/Extender: Zipporah Plants Weeks in Treatment: 1 Education Assessment Education Provided To: Patient Education Topics Provided Malignant/Atypical Wounds: Methods: Explain/Verbal Responses: Reinforcements needed, State content correctly Wound/Skin ImpairmentDANINE, HOR ([id number]) [id number] Page 6 of 7 Methods: Explain/Verbal Responses: Reinforcements needed, State content correctly Electronic Signature(s) Signed: 05/04/2023 5:40:23 PM By: Zenaida Deed RN, BSN Entered By: Zenaida Deed on 05/04/2023  11:56:38 -------------------------------------------------------------------------------- Wound Assessment Details Patient Name: Date of Service: [name], [name] 05/04/2023 11:15 A M Medical Record Number: [id number] Patient Account Number: [id number] Date of Birth/Sex: Treating RN: [birth year] (62 y.o. 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 Wound Open Wounding Event: Gradually Appeared Status: Date Acquired: 06/29/2022 Comorbid Anemia, Hypertension, Received Chemotherapy, Weeks Of Treatment: 1 History: Anorexia/bulimia, Confinement Anxiety Clustered Wound: No Photos Wound Measurements Length: (cm) 25 Width: (cm) 27 Depth: (cm) 0.1 Area: (cm) 530.144 Volume: (cm) 53.014 % Reduction in Area: 5.7% % Reduction in Volume: 5.7% Epithelialization: Small (1-33%) Tunneling: No Undermining: No Wound Description Classification: Full Thickness Without Exposed Support Wound Margin: Indistinct, nonvisible Exudate Amount: Large Exudate Type: Serosanguineous Exudate Color: red, brown Structures Foul Odor After Cleansing: No Slough/Fibrino Yes Wound Bed Granulation Amount: Small (1-33%) Exposed Structure Granulation Quality: Red, Friable Fascia Exposed: No Necrotic Amount: Large (67-100%) Fat Layer (  one wound 1 5 []  - 0 Complex Wound Cleansing - multiple wounds X- 1 5 Wound Imaging (photographs - any number of wounds) []  - 0 Wound Tracing (instead of photographs) X- 1 5 Simple Wound Measurement - one wound []  - 0 Complex Wound Measurement - multiple wounds INTERVENTIONS - Wound Dressings []  - 0 Small Wound Dressing one or multiple wounds X- 1 15 Medium Wound Dressing one or multiple wounds []  - 0 Large Wound Dressing one or multiple wounds X- 1 5 Application of Medications - topical []  - 0 Application of Medications - injection INTERVENTIONS - Miscellaneous []  - 0 External ear exam []  - 0 Specimen Collection (cultures, biopsies, blood, body fluids, etc.) []  - 0 Specimen(s) / Culture(s) sent or taken to Lab for analysis []  - 0 Patient Transfer (multiple staff / Nurse, adult / Similar devices) []  - 0 Simple Staple / Suture removal (25 or less) []  - 0 Complex Staple / Suture removal (26 or more) []  - 0 Hypo / Hyperglycemic Management (close monitor of Blood Glucose) [name], [name] ([id number]) [id number] Page 3 of 7 []  - 0 Ankle / Brachial Index (ABI) - do not check if billed separately X- 1 5 Vital Signs Has the patient been seen at the hospital within the last three years: Yes Total Score: 105 Level Of Care: New/Established - Level 3 Electronic Signature(s) Signed: 05/04/2023 5:40:23 PM By: Zenaida Deed RN, BSN Entered By: Zenaida Deed on 05/04/2023 14:39:52 -------------------------------------------------------------------------------- Lower Extremity Assessment Details Patient Name: Date of Service: [name], [name] 05/04/2023 11:15 A M Medical Record Number: [id number] Patient Account  Number: [id number] Date of Birth/Sex: Treating RN: [birth year] (62 y.o. [name] Primary Care Bryann Gentz: Artis Delay Other Clinician: Referring Kylii Ennis: Treating Duncan Alejandro/Extender: Vassie Moselle, Ni Weeks in Treatment: 1 Electronic Signature(s) Signed: 05/04/2023 5:40:23 PM By: Zenaida Deed RN, BSN Entered By: Zenaida Deed on 05/04/2023 11:47:10 -------------------------------------------------------------------------------- Multi Wound Chart Details Patient Name: Date of Service: [name], [name] 05/04/2023 11:15 A M Medical Record Number: [id number] Patient Account Number: [id number] Date of Birth/Sex: Treating RN: [birth year] (62 y.o. F) Primary Care Zeno Hickel: Artis Delay Other Clinician: Referring Kawanda Drumheller: Treating Alta Goding/Extender: Vassie Moselle, Ni Weeks in Treatment: 1 Vital Signs Height(in): 66 Pulse(bpm): 137 Weight(lbs): 137 Blood Pressure(mmHg): 101/65 Body Mass Index(BMI): 22.1 Temperature(F): 97.7 Respiratory Rate(breaths/min): 18 [1:Photos:] [N/A:N/A] Right Chest N/A N/A Wound Location: Gradually Appeared N/A N/A Wounding Event: Malignant Wound N/A N/A Primary Etiology: Anemia, Hypertension, Received N/A N/A Comorbid History: Chemotherapy, Anorexia/bulimia, Confinement Anxiety 06/29/2022 N/A N/A Date Acquired: 1 N/A N/A Weeks of TreatmentSYD, [name] ([id number]) [id number] Page 4 of 7 Open N/A N/A Wound Status: No N/A N/A Wound Recurrence: 25x27x0.1 N/A N/A Measurements L x W x D (cm) 530.144 N/A N/A A (cm) : rea 53.014 N/A N/A Volume (cm) : 5.70% N/A N/A % Reduction in Area: 5.70% N/A N/A % Reduction in Volume: Full Thickness Without Exposed N/A N/A Classification: Support Structures Large N/A N/A Exudate A mount: Serosanguineous N/A N/A Exudate Type: red, brown N/A N/A Exudate Color: Indistinct, nonvisible N/A N/A Wound Margin: Small (1-33%) N/A N/A Granulation  Amount: Red, Friable N/A N/A Granulation Quality: Large (67-100%) N/A N/A Necrotic Amount: Eschar, Adherent Slough N/A N/A Necrotic Tissue: Fat Layer (Subcutaneous Tissue): Yes N/A N/A Exposed Structures: Fascia: No Tendon: No Muscle: No Joint: No Bone: No Small (1-33%) N/A N/A Epithelialization: No Abnormalities Noted N/A N/A Periwound Skin Texture: No Abnormalities Noted N/A N/A Periwound Skin Moisture: Erythema: No N/A N/A Periwound Skin  one wound 1 5 []  - 0 Complex Wound Cleansing - multiple wounds X- 1 5 Wound Imaging (photographs - any number of wounds) []  - 0 Wound Tracing (instead of photographs) X- 1 5 Simple Wound Measurement - one wound []  - 0 Complex Wound Measurement - multiple wounds INTERVENTIONS - Wound Dressings []  - 0 Small Wound Dressing one or multiple wounds X- 1 15 Medium Wound Dressing one or multiple wounds []  - 0 Large Wound Dressing one or multiple wounds X- 1 5 Application of Medications - topical []  - 0 Application of Medications - injection INTERVENTIONS - Miscellaneous []  - 0 External ear exam []  - 0 Specimen Collection (cultures, biopsies, blood, body fluids, etc.) []  - 0 Specimen(s) / Culture(s) sent or taken to Lab for analysis []  - 0 Patient Transfer (multiple staff / Nurse, adult / Similar devices) []  - 0 Simple Staple / Suture removal (25 or less) []  - 0 Complex Staple / Suture removal (26 or more) []  - 0 Hypo / Hyperglycemic Management (close monitor of Blood Glucose) [name], [name] ([id number]) [id number] Page 3 of 7 []  - 0 Ankle / Brachial Index (ABI) - do not check if billed separately X- 1 5 Vital Signs Has the patient been seen at the hospital within the last three years: Yes Total Score: 105 Level Of Care: New/Established - Level 3 Electronic Signature(s) Signed: 05/04/2023 5:40:23 PM By: Zenaida Deed RN, BSN Entered By: Zenaida Deed on 05/04/2023 14:39:52 -------------------------------------------------------------------------------- Lower Extremity Assessment Details Patient Name: Date of Service: [name], [name] 05/04/2023 11:15 A M Medical Record Number: [id number] Patient Account  Number: [id number] Date of Birth/Sex: Treating RN: [birth year] (62 y.o. [name] Primary Care Bryann Gentz: Artis Delay Other Clinician: Referring Kylii Ennis: Treating Duncan Alejandro/Extender: Vassie Moselle, Ni Weeks in Treatment: 1 Electronic Signature(s) Signed: 05/04/2023 5:40:23 PM By: Zenaida Deed RN, BSN Entered By: Zenaida Deed on 05/04/2023 11:47:10 -------------------------------------------------------------------------------- Multi Wound Chart Details Patient Name: Date of Service: [name], [name] 05/04/2023 11:15 A M Medical Record Number: [id number] Patient Account Number: [id number] Date of Birth/Sex: Treating RN: [birth year] (62 y.o. F) Primary Care Zeno Hickel: Artis Delay Other Clinician: Referring Kawanda Drumheller: Treating Alta Goding/Extender: Vassie Moselle, Ni Weeks in Treatment: 1 Vital Signs Height(in): 66 Pulse(bpm): 137 Weight(lbs): 137 Blood Pressure(mmHg): 101/65 Body Mass Index(BMI): 22.1 Temperature(F): 97.7 Respiratory Rate(breaths/min): 18 [1:Photos:] [N/A:N/A] Right Chest N/A N/A Wound Location: Gradually Appeared N/A N/A Wounding Event: Malignant Wound N/A N/A Primary Etiology: Anemia, Hypertension, Received N/A N/A Comorbid History: Chemotherapy, Anorexia/bulimia, Confinement Anxiety 06/29/2022 N/A N/A Date Acquired: 1 N/A N/A Weeks of TreatmentSYD, [name] ([id number]) [id number] Page 4 of 7 Open N/A N/A Wound Status: No N/A N/A Wound Recurrence: 25x27x0.1 N/A N/A Measurements L x W x D (cm) 530.144 N/A N/A A (cm) : rea 53.014 N/A N/A Volume (cm) : 5.70% N/A N/A % Reduction in Area: 5.70% N/A N/A % Reduction in Volume: Full Thickness Without Exposed N/A N/A Classification: Support Structures Large N/A N/A Exudate A mount: Serosanguineous N/A N/A Exudate Type: red, brown N/A N/A Exudate Color: Indistinct, nonvisible N/A N/A Wound Margin: Small (1-33%) N/A N/A Granulation  Amount: Red, Friable N/A N/A Granulation Quality: Large (67-100%) N/A N/A Necrotic Amount: Eschar, Adherent Slough N/A N/A Necrotic Tissue: Fat Layer (Subcutaneous Tissue): Yes N/A N/A Exposed Structures: Fascia: No Tendon: No Muscle: No Joint: No Bone: No Small (1-33%) N/A N/A Epithelialization: No Abnormalities Noted N/A N/A Periwound Skin Texture: No Abnormalities Noted N/A N/A Periwound Skin Moisture: Erythema: No N/A N/A Periwound Skin  [name], [name] ([id number]) [id number] Page 1 of 7 Visit Report for 05/04/2023 Arrival Information Details Patient Name: Date of Service: [name], [name] 05/04/2023 11:15 A M Medical Record Number: [id number] Patient Account Number: [id number] Date of Birth/Sex: Treating RN: [birth year] (62 y.o. Billy Coast, Linda Primary Care Alante Weimann: Artis Delay Other Clinician: Referring Jaxyn Rout: Treating Roxanne Orner/Extender: Vassie Moselle, Ni Weeks in Treatment: 1 Visit Information History Since Last Visit Added or deleted any medications: No Patient Arrived: Ambulatory Any new allergies or adverse reactions: No Arrival Time: 11:35 Had a fall or experienced change in No Accompanied By: mother activities of daily living that may affect Transfer Assistance: None risk of falls: Patient Identification Verified: Yes Signs or symptoms of abuse/neglect since last visito No Secondary Verification Process Completed: Yes Hospitalized since last visit: No Patient Requires Transmission-Based Precautions: No Implantable device outside of the clinic excluding No Patient Has Alerts: No cellular tissue based products placed in the center since last visit: Has Dressing in Place as Prescribed: Yes Pain Present Now: Yes Electronic Signature(s) Signed: 05/04/2023 5:40:23 PM By: Zenaida Deed RN, BSN Entered By: Zenaida Deed on 05/04/2023 11:38:52 -------------------------------------------------------------------------------- Clinic Level of Care Assessment Details Patient Name: Date of Service: LYSHA, SCHRADE 05/04/2023 11:15 A M Medical Record Number: [id number] Patient Account Number: [id number] Date of Birth/Sex: Treating RN: [birth year] (62 y.o. [name] Primary Care Leovanni Bjorkman: Artis Delay Other Clinician: Referring Margia Wiesen: Treating Elex Mainwaring/Extender: Vassie Moselle, Ni Weeks in Treatment: 1 Clinic Level of Care Assessment Items TOOL 4  Quantity Score []  - 0 Use when only an EandM is performed on FOLLOW-UP visit ASSESSMENTS - Nursing Assessment / Reassessment X- 1 10 Reassessment of Wendy-morbidities (includes updates in patient status) X- 1 5 Reassessment of Adherence to Treatment Plan ASSESSMENTS - Wound and Skin A ssessment / Reassessment X - Simple Wound Assessment / Reassessment - one wound 1 5 []  - 0 Complex Wound Assessment / Reassessment - multiple wounds []  - 0 Dermatologic / Skin Assessment (not related to wound area) ASSESSMENTS - Focused Assessment []  - 0 Circumferential Edema Measurements - multi extremities []  - 0 Nutritional Assessment / Counseling / Intervention ZAKARIAH, DEJARNETTE ([id number]) [id number] Page 2 of 7 []  - 0 Lower Extremity Assessment (monofilament, tuning fork, pulses) []  - 0 Peripheral Arterial Disease Assessment (using hand held doppler) ASSESSMENTS - Ostomy and/or Continence Assessment and Care []  - 0 Incontinence Assessment and Management []  - 0 Ostomy Care Assessment and Management (repouching, etc.) PROCESS - Coordination of Care X - Simple Patient / Family Education for ongoing care 1 15 []  - 0 Complex (extensive) Patient / Family Education for ongoing care X- 1 10 Staff obtains Chiropractor, Records, T Results / Process Orders est X- 1 10 Staff telephones HHA, Nursing Homes / Clarify orders / etc []  - 0 Routine Transfer to another Facility (non-emergent condition) []  - 0 Routine Hospital Admission (non-emergent condition) []  - 0 New Admissions / Manufacturing engineer / Ordering NPWT Apligraf, etc. , []  - 0 Emergency Hospital Admission (emergent condition) X- 1 10 Simple Discharge Coordination []  - 0 Complex (extensive) Discharge Coordination PROCESS - Special Needs []  - 0 Pediatric / Minor Patient Management []  - 0 Isolation Patient Management []  - 0 Hearing / Language / Visual special needs []  - 0 Assessment of Community  assistance (transportation, D/C planning, etc.) []  - 0 Additional assistance / Altered mentation []  - 0 Support Surface(s) Assessment (bed, cushion, seat, etc.) INTERVENTIONS - Wound Cleansing / Measurement X - Simple Wound Cleansing -

## 2023-05-04 NOTE — Progress Notes (Addendum)
Wendy, Burch ([id number]) [id number] Page 1 of 7 Visit Report for 05/04/2023 Chief Complaint Document Details Patient Name: Date of Service: [name], [name] 05/04/2023 11:15 A M Medical Record Number: [id number] Patient Account Number: [id number] Date of Birth/Sex: Treating RN: 06/23/1961 (62 y.o. F) Primary Care Provider: Artis Delay Other Clinician: Referring Provider: Treating Provider/Extender: Vassie Moselle, Ni Weeks in Treatment: 1 Information Obtained from: Patient Chief Complaint Patient seen for complaints of Non-Healing Wound due to metastatic breast cancer Electronic Signature(s) Signed: 05/04/2023 12:17:20 PM By: Duanne Guess MD FACS Entered By: Duanne Guess on 05/04/2023 12:17:20 -------------------------------------------------------------------------------- HPI Details Patient Name: Date of Service: Wendy, Burch 05/04/2023 11:15 A M Medical Record Number: [id number] Patient Account Number: [id number] Date of Birth/Sex: Treating RN: 1961/02/07 (62 y.o. 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 She was diagnosed in June 2023. She had a fungating right breast lesion. She also had a malignant pleural effusion. She has been treated with chemotherapy. Apparently the breast wound responded well to treatment, along with antibiotic therapy. In January, however, the chest wall reopened. She has very poor nutritional status and struggles to get adequate protein calorie intake for wound healing. She had follow-up with her oncologist on April 07, 2023; She was referred to the wound care center for further management. 05/04/2023: The whole wound area looks quite clean. There is no  odor. Electronic Signature(s) Signed: 05/04/2023 12:17:58 PM By: Duanne Guess MD FACS Entered By: Duanne Guess on 05/04/2023 12:17:58 -------------------------------------------------------------------------------- Physical Exam Details Patient Name: Date of Service: [name], [name] 05/04/2023 11:15 A M Medical Record Number: [id number] Patient Account Number: [id number] Date of Birth/Sex: Treating RN: 1961-06-26 (62 y.o. [name], [name] ([id number]) [id number] Page 2 of 7 Primary Care Provider: Artis Delay Other Clinician: Referring Provider: Treating Provider/Extender: Vassie Moselle, Ni Weeks in Treatment: 1 Constitutional .Tachycardic, asymptomatic. . . no acute distress. Respiratory Normal work of breathing on room air. Notes 05/04/2023: The whole wound area looks quite clean. There is no odor. Electronic Signature(s) Signed: 05/04/2023 12:19:03 PM By: Duanne Guess MD FACS Entered By: Duanne Guess on 05/04/2023 12:19:03 -------------------------------------------------------------------------------- Physician Orders Details Patient Name: Date of Service: [name], [name] 05/04/2023 11:15 A M Medical Record Number: [id number] Patient Account Number: [id number] Date of Birth/Sex: Treating RN: 12/12/1960 (62 y.o. 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 - Dr. Lady Gary RM 1 Return A Monday  8/19 @ 1:15 pm Anesthetic Wound #1 Right Chest (In clinic) Topical Lidocaine 4% applied to wound bed Bathing/ Shower/ Hygiene May shower and wash wound with soap and water. Home Health No change in wound care orders this week; continue Home Health for wound care. May utilize formulary equivalent dressing for wound treatment orders unless otherwise specified. Dressing changes to be completed by Home Health on Monday / Wednesday / Friday except when patient has scheduled visit at [facility]. Other Home Health  Orders/Instructions: - [facility] Wound Treatment Wound #1 - Chest Wound Laterality: Right Cleanser: Soap and Water 1 x Per Day/30 Days Discharge Instructions: May shower and wash wound with dial antibacterial soap and water prior to dressing change. Prim Dressing: Dakin's Solution 0.25%, 16 (oz) 1 x Per Day/30 Days ary Discharge Instructions: Moisten gauze with Dakin's solution and lay over wound surface Secondary Dressing: ABD Pad, 8x10 1 x Per Day/30 Days Discharge Instructions: Apply over primary dressing as directed. Wendy, Burch ([id number]) [id number] Page 3 of 7 Secured With: breast binder 1 x Per Day/30 Days Electronic Signature(s) Signed: 05/04/2023 12:37:08 PM By: Duanne Guess MD FACS Entered By: Duanne Guess on 05/04/2023 12:19:13 -------------------------------------------------------------------------------- Problem List Details Patient Name: Date of Service: [name], [name] 05/04/2023 11:15 A M Medical Record Number: [id number] Patient Account Number: [id number] Date of Birth/Sex: Treating RN: 05/23/1961 (62 y.o. 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 She was diagnosed in June 2023. She had a fungating right breast lesion. She also had a malignant pleural effusion. She has been treated with chemotherapy. Apparently the breast wound responded well to treatment, along with antibiotic therapy. In January, however, the chest wall reopened. She has very poor nutritional status and struggles to get adequate protein calorie intake for wound healing. She had follow-up with her oncologist on April 07, 2023; She was referred to the wound care center for further management. 05/04/2023: The whole wound area looks quite clean. There is no odor. Patient History Information obtained from  Patient, Chart. Family History Diabetes - Father, Heart Disease - Father, Hypertension - Maternal Grandparents,Mother,Siblings, Kidney Disease - Father, Stroke - Father, No family history of Cancer, Hereditary Spherocytosis, Lung Disease, Seizures, Thyroid Problems, Tuberculosis. Social History Never smoker, Marital Status - Single, Alcohol Use - Never, Drug Use - No History, Caffeine Use - Daily - coffee. Medical History Eyes Denies history of Cataracts, Glaucoma Ear/Nose/Mouth/Throat Denies history of Chronic sinus problems/congestion, Middle ear problems Hematologic/Lymphatic Patient has history of Anemia - microcytic Cardiovascular Patient has history of Hypertension Endocrine Denies history of Type I Diabetes, Type II Diabetes Genitourinary Denies history of End Stage Renal Disease Integumentary (Skin) Denies history of History of Burn Musculoskeletal Denies history of Gout, Rheumatoid Arthritis, Osteoarthritis, Osteomyelitis Oncologic Patient has history of Received Chemotherapy - oral Denies history of Received Radiation Psychiatric Patient has  history of Anorexia/bulimia - poor appetite, Confinement Anxiety Hospitalization/Surgery History - thoracentesis 03/2022. - ankle surgery. Medical A Surgical History Notes nd Respiratory pleural effusion, hx empyema Gastrointestinal GERD Oncologic breast CA Objective Constitutional Tachycardic, asymptomatic. no acute distress. Vitals Time Taken: 11:40 AM, Height: 66 in, Weight: 137 lbs, BMI: 22.1, Temperature: 97.7 F, Pulse: 137 bpm, Respiratory Rate: 18 breaths/min, Blood Pressure: 101/65 mmHg. Respiratory Normal work of breathing on room air. General Notes: 05/04/2023: The whole wound area looks quite clean. There is no odor. ROSHELL, [name] ([id number]) [id number] Page 5 of 7 Integumentary (Hair, Skin) Wound #1 status is Open. Original cause of wound was Gradually Appeared. The date acquired  was: 06/29/2022. The wound has been in treatment 1 weeks. The wound is located on the Right Chest. The wound measures 25cm length x 27cm width x 0.1cm depth; 530.144cm^2 area and 53.014cm^3 volume. There is Fat Layer (Subcutaneous Tissue) exposed. There is no tunneling or undermining noted. There is a large amount of serosanguineous drainage noted. The wound margin is indistinct and nonvisible. There is small (1-33%) red, friable granulation within the wound bed. There is a large (67-100%) amount of necrotic tissue within the wound bed including Eschar and Adherent Slough. The periwound skin appearance had no abnormalities noted for texture. The periwound skin appearance had no abnormalities noted for moisture. The periwound skin appearance did not exhibit: Erythema. Periwound temperature was noted as No Abnormality. The periwound has tenderness on palpation. Assessment Active Problems ICD-10 Non-pressure chronic ulcer of skin of other sites with unspecified severity Malignant neoplasm of unspecified site of right female breast Secondary malignant neoplasm of bone Unspecified protein-calorie malnutrition Anemia due to antineoplastic chemotherapy Plan Follow-up Appointments: Return Appointment in 2 weeks. - Dr. Lady Gary RM 1 Monday 8/19 @ 1:15 pm Anesthetic: Wound #1 Right Chest: (In clinic) Topical Lidocaine 4% applied to wound bed Bathing/ Shower/ Hygiene: May shower and wash wound with soap and water. Home Health: No change in wound care orders this week; continue Home Health for wound care. May utilize formulary equivalent dressing for wound treatment orders unless otherwise specified. Dressing changes to be completed by Home Health on Monday / Wednesday / Friday except when patient has scheduled visit at [facility]. Other Home Health Orders/Instructions: - [facility] WOUND #1: - Chest Wound Laterality: Right Cleanser: Soap and Water 1 x Per Day/30 Days Discharge Instructions: May  shower and wash wound with dial antibacterial soap and water prior to dressing change. Prim Dressing: Dakin's Solution 0.25%, 16 (oz) 1 x Per Day/30 Days ary Discharge Instructions: Moisten gauze with Dakin's solution and lay over wound surface Secondary Dressing: ABD Pad, 8x10 1 x Per Day/30 Days Discharge Instructions: Apply over primary dressing as directed. Secured With: breast binder 1 x Per Day/30 Days 05/04/2023: The whole wound area looks quite clean. There is no odor. No debridement was performed today. We will continue using Dakin's-moistened gauze to the entire wound surface, holding everything in place with a surgical breast binder. She will follow-up in 2 weeks. Electronic Signature(s) Signed: 05/04/2023 12:20:34 PM By: Duanne Guess MD FACS Entered By: Duanne Guess on 05/04/2023 12:20:34 -------------------------------------------------------------------------------- HxROS Details Patient Name: Date of Service: [name], [name] 05/04/2023 11:15 A M Medical Record Number: [id number] Patient Account Number: [id number] Date of Birth/Sex: Treating RN: 11-Mar-1961 (62 y.o. F) Primary Care Provider: Artis Delay Other Clinician: Referring Provider: Treating Provider/Extender: Vassie Moselle, Ni Weeks in Treatment: 1 Cementon, Crystalann ([id number]) [id number] Page 6 of 7 Information Obtained From Patient Chart  Eyes Medical History: Negative for: Cataracts; Glaucoma Ear/Nose/Mouth/Throat Medical History: Negative for: Chronic sinus problems/congestion; Middle ear problems Hematologic/Lymphatic Medical History: Positive for: Anemia - microcytic Respiratory Medical History: Past Medical History Notes: pleural effusion, hx empyema Cardiovascular Medical History: Positive for: Hypertension Gastrointestinal Medical History: Past Medical History Notes: GERD Endocrine Medical History: Negative for: Type I Diabetes; Type II  Diabetes Genitourinary Medical History: Negative for: End Stage Renal Disease Integumentary (Skin) Medical History: Negative for: History of Burn Musculoskeletal Medical History: Negative for: Gout; Rheumatoid Arthritis; Osteoarthritis; Osteomyelitis Oncologic Medical History: Positive for: Received Chemotherapy - oral Negative for: Received Radiation Past Medical History Notes: breast CA Psychiatric Medical History: Positive for: Anorexia/bulimia - poor appetite; Confinement Anxiety Immunizations Pneumococcal Vaccine: Received Pneumococcal Vaccination: Yes Received Pneumococcal Vaccination On or After 60th Birthday: Yes Implantable Devices No devices added Hospitalization / Surgery History Type of Hospitalization/Surgery [name], [name] ([id number]) [id number] Page 7 of 7 thoracentesis 03/2022 ankle surgery Family and Social History Cancer: No; Diabetes: Yes - Father; Heart Disease: Yes - Father; Hereditary Spherocytosis: No; Hypertension: Yes - Maternal Grandparents,Mother,Siblings; Kidney Disease: Yes - Father; Lung Disease: No; Seizures: No; Stroke: Yes - Father; Thyroid Problems: No; Tuberculosis: No; Never smoker; Marital Status - Single; Alcohol Use: Never; Drug Use: No History; Caffeine Use: Daily - coffee; Financial Concerns: No; Food, Clothing or Shelter Needs: No; Support System Lacking: No; Transportation Concerns: No Electronic Signature(s) Signed: 05/04/2023 12:37:08 PM By: Duanne Guess MD FACS Entered By: Duanne Guess on 05/04/2023 12:18:33 -------------------------------------------------------------------------------- SuperBill Details Patient Name: Date of Service: [name], [name] 05/04/2023 Medical Record Number: [id number] Patient Account Number: [id number] Date of Birth/Sex: Treating RN: August 03, 1961 (62 y.o. 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.81 Anemia due to antineoplastic chemotherapy Facility Procedures : CPT4 Code: [id number] Description: 9213 - WOUND CARE VISIT-LEV 3 EST PT Modifier: Quantity: 1 Physician Procedures : CPT4 Code Description Modifier [id number] - WC PHYS LEVEL 3 - EST PT ICD-10 Diagnosis Description 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

## 2023-05-04 NOTE — Telephone Encounter (Signed)
She called back. Given 8/9 appts, she is aware of appts.

## 2023-05-04 NOTE — Telephone Encounter (Signed)
Called and left a message with 8/9 appt details, 2 pm lab and then see Dr. Bertis Ruddy at 2:45 pm for 45 mins. Ask her to call the office back.

## 2023-05-05 ENCOUNTER — Other Ambulatory Visit: Payer: Self-pay

## 2023-05-05 ENCOUNTER — Telehealth: Payer: Self-pay

## 2023-05-05 ENCOUNTER — Other Ambulatory Visit (HOSPITAL_COMMUNITY): Payer: Self-pay

## 2023-05-05 NOTE — Telephone Encounter (Signed)
Returned call to nurse with [facility]. Given verbal order home home health nurse 2 x a week, home health aide and OT. They will fax the orders to be signed. [name] did not have 2 medications in the home and said that she does not take them anymore.  Mirtazapine and metoprolol are the 2 medications.  Her bp at visit was 98/70.  FYI

## 2023-05-06 ENCOUNTER — Encounter (HOSPITAL_BASED_OUTPATIENT_CLINIC_OR_DEPARTMENT_OTHER): Payer: BC Managed Care – PPO | Admitting: Physician Assistant

## 2023-05-06 ENCOUNTER — Telehealth: Payer: Self-pay | Admitting: *Deleted

## 2023-05-06 NOTE — Telephone Encounter (Signed)
Returned call to [facility]/ nurse Mardella Layman [phone]. Given verbal order social work evaluation and PT evaluation. They will fax orders to be signed.  [facility] nurse said she reminded patient about appts on 8/9 at [facility]. She added as Lorain Childes - patient told nurse yesterday that she wanted to be transferred to a nursing home.   HH nurse stated HH needs a teachable caregiver when wound care is needed and she has informed patient she needs to reach out to patient's mother/sister to teach wound care. [facility] nurse stated patient told her she would think about having them learn.

## 2023-05-07 ENCOUNTER — Telehealth: Payer: Self-pay

## 2023-05-07 ENCOUNTER — Encounter: Payer: Self-pay | Admitting: Hematology and Oncology

## 2023-05-07 NOTE — Telephone Encounter (Signed)
Mardella Layman nurse with [facility] health called to give update. [name] refused nurse visit today. Her insurance will not pay for CNA and Child psychotherapist. Kayslee refused PT and OT. Her Mom was going to be the teachable caregiver. But, yesterday her sister called home health and said that her Mom cannot be the teachable caregiver due to being 62 years old. Due to not having a teachable care giver they will have d/c Arien after 2 more visits.  Winta is telling the home health nurse that she wants to go to a nursing home. Home health said that they have in home hospice.

## 2023-05-08 ENCOUNTER — Other Ambulatory Visit: Payer: BC Managed Care – PPO

## 2023-05-08 ENCOUNTER — Other Ambulatory Visit: Payer: Self-pay

## 2023-05-08 ENCOUNTER — Ambulatory Visit: Payer: BC Managed Care – PPO | Admitting: Hematology and Oncology

## 2023-05-08 ENCOUNTER — Encounter: Payer: Self-pay | Admitting: Hematology and Oncology

## 2023-05-08 ENCOUNTER — Inpatient Hospital Stay: Payer: BC Managed Care – PPO | Attending: Hematology and Oncology

## 2023-05-08 ENCOUNTER — Inpatient Hospital Stay (HOSPITAL_BASED_OUTPATIENT_CLINIC_OR_DEPARTMENT_OTHER): Payer: BC Managed Care – PPO | Admitting: Hematology and Oncology

## 2023-05-08 VITALS — BP 117/77 | HR 125 | Resp 18 | Ht 66.0 in | Wt 127.4 lb

## 2023-05-08 DIAGNOSIS — Z170 Estrogen receptor positive status [ER+]: Secondary | ICD-10-CM | POA: Diagnosis not present

## 2023-05-08 DIAGNOSIS — R000 Tachycardia, unspecified: Secondary | ICD-10-CM | POA: Insufficient documentation

## 2023-05-08 DIAGNOSIS — T148XXD Other injury of unspecified body region, subsequent encounter: Secondary | ICD-10-CM | POA: Diagnosis not present

## 2023-05-08 DIAGNOSIS — R634 Abnormal weight loss: Secondary | ICD-10-CM | POA: Insufficient documentation

## 2023-05-08 DIAGNOSIS — C7951 Secondary malignant neoplasm of bone: Secondary | ICD-10-CM | POA: Diagnosis present

## 2023-05-08 DIAGNOSIS — C50911 Malignant neoplasm of unspecified site of right female breast: Secondary | ICD-10-CM | POA: Insufficient documentation

## 2023-05-08 DIAGNOSIS — Z9011 Acquired absence of right breast and nipple: Secondary | ICD-10-CM | POA: Insufficient documentation

## 2023-05-08 DIAGNOSIS — G893 Neoplasm related pain (acute) (chronic): Secondary | ICD-10-CM | POA: Diagnosis not present

## 2023-05-08 DIAGNOSIS — Z79891 Long term (current) use of opiate analgesic: Secondary | ICD-10-CM | POA: Insufficient documentation

## 2023-05-08 DIAGNOSIS — Z91148 Patient's other noncompliance with medication regimen for other reason: Secondary | ICD-10-CM | POA: Diagnosis not present

## 2023-05-08 DIAGNOSIS — Z79899 Other long term (current) drug therapy: Secondary | ICD-10-CM | POA: Diagnosis not present

## 2023-05-08 DIAGNOSIS — Z7189 Other specified counseling: Secondary | ICD-10-CM

## 2023-05-08 DIAGNOSIS — Z91199 Patient's noncompliance with other medical treatment and regimen due to unspecified reason: Secondary | ICD-10-CM

## 2023-05-08 LAB — COMPREHENSIVE METABOLIC PANEL
ALT: 6 U/L (ref 0–44)
AST: 33 U/L (ref 15–41)
Albumin: 3.2 g/dL — ABNORMAL LOW (ref 3.5–5.0)
Alkaline Phosphatase: 64 U/L (ref 38–126)
Anion gap: 7 (ref 5–15)
BUN: 19 mg/dL (ref 8–23)
CO2: 29 mmol/L (ref 22–32)
Calcium: 8.6 mg/dL — ABNORMAL LOW (ref 8.9–10.3)
Chloride: 102 mmol/L (ref 98–111)
Creatinine, Ser: 1.06 mg/dL — ABNORMAL HIGH (ref 0.44–1.00)
GFR, Estimated: 59 mL/min — ABNORMAL LOW (ref >60)
Glucose, Bld: 128 mg/dL — ABNORMAL HIGH (ref 70–99)
Potassium: 4.3 mmol/L (ref 3.5–5.1)
Sodium: 138 mmol/L (ref 135–145)
Total Bilirubin: 0.3 mg/dL (ref 0.3–1.2)
Total Protein: 6.4 g/dL — ABNORMAL LOW (ref 6.5–8.1)

## 2023-05-08 LAB — CBC WITH DIFFERENTIAL/PLATELET
Abs Immature Granulocytes: 0.1 10*3/uL — ABNORMAL HIGH (ref 0.00–0.07)
Basophils Absolute: 0.1 10*3/uL (ref 0.0–0.1)
Basophils Relative: 1 %
Eosinophils Absolute: 0.1 10*3/uL (ref 0.0–0.5)
Eosinophils Relative: 1 %
HCT: 28 % — ABNORMAL LOW (ref 36.0–46.0)
Hemoglobin: 9 g/dL — ABNORMAL LOW (ref 12.0–15.0)
Immature Granulocytes: 1 %
Lymphocytes Relative: 20 %
Lymphs Abs: 1.7 10*3/uL (ref 0.7–4.0)
MCH: 26.9 pg (ref 26.0–34.0)
MCHC: 32.1 g/dL (ref 30.0–36.0)
MCV: 83.8 fL (ref 80.0–100.0)
Monocytes Absolute: 0.9 10*3/uL (ref 0.1–1.0)
Monocytes Relative: 11 %
Neutro Abs: 5.5 10*3/uL (ref 1.7–7.7)
Neutrophils Relative %: 66 %
Platelets: 494 10*3/uL — ABNORMAL HIGH (ref 150–400)
RBC: 3.34 MIL/uL — ABNORMAL LOW (ref 3.87–5.11)
RDW: 15.2 % (ref 11.5–15.5)
WBC: 8.4 10*3/uL (ref 4.0–10.5)
nRBC: 0 % (ref 0.0–0.2)

## 2023-05-08 NOTE — Assessment & Plan Note (Signed)
She was seen by wound care center who recommended advanced home care for follow-up The patient is not capable of performing dressing changes herself Unfortunately, the patient has told advanced home care nurse not to return At this point in time, after long conversation, she is willing to consider being admitted to skilled nursing facility for assistance

## 2023-05-08 NOTE — Assessment & Plan Note (Signed)
She has been noncompliant with medications and prescribed treatment She is not capable of following instruction Her mother tried to help with pill organization but the patient has declined this repeatedly for her mother to get involved She has declined home physical therapy She have declined for home occupational therapy She has not been taking her medications as prescribed and overall, I do not feel that the patient is capable of taking care of herself We will consult social worker to look for skilled nursing facility for admission

## 2023-05-08 NOTE — Progress Notes (Signed)
[facility] OFFICE PROGRESS NOTE  Patient Care Team: Artis Delay, MD as PCP - General (Hematology and Oncology)  ASSESSMENT & PLAN:  Breast cancer, stage 4, right (HCC) Overall, she tolerated Xeloda well Recently, will increase the dose of her Xeloda She is due to pick up the new prescription I will see her back in 2 weeks for further follow-up Reinforced the importance of not canceling her appointment  Wound healing, delayed She was seen by wound care center who recommended advanced home care for follow-up The patient is not capable of performing dressing changes herself Unfortunately, the patient has told advanced home care nurse not to return At this point in time, after long conversation, she is willing to consider being admitted to skilled nursing facility for assistance  Cancer associated pain We have extensive discussion about pain control The patient repeatedly stated that her pain is not well-controlled She brought all her pill bottles with her Her MS Contin was refilled a month ago and she still have at least 30 pills or more This is similar with her Dilaudid.  She is claims she is taking it as prescribed but she have over 30 pills and have not refilled her prescription since June Overall, I felt that she is noncompliant taking her pain medication as prescribed For this reason, I am not willing to change her prescription  Weight loss, non-intentional She has progressive weight loss Previously I have prescribed Remeron but the patient has not been taking it She has not been eating well and not been taking her nutritional supplement   Non compliance with medical treatment She has been noncompliant with medications and prescribed treatment She is not capable of following instruction Her mother tried to help with pill organization but the patient has declined this repeatedly for her mother to get involved She has declined home physical therapy She have  declined for home occupational therapy She has not been taking her medications as prescribed and overall, I do not feel that the patient is capable of taking care of herself We will consult social worker to look for skilled nursing facility for admission  Goals of care, counseling/discussion I have numerous goals of care discussion with the patient and family in the past The patient is declining due to inability to take care of herself and inability to take medications and treatment as prescribed I felt that she is on rapid decline and I do not believe she is going to survive this cancer without additional help The patient does not want to stop treatment I will readdress goals of care again in 2 weeks  No orders of the defined types were placed in this encounter.   All questions were answered. The patient knows to call the clinic with any problems, questions or concerns. The total time spent in the appointment was 80 minutes encounter with patients including review of chart and various tests results, discussions about plan of care and coordination of care plan   Artis Delay, MD 05/08/2023 3:56 PM  INTERVAL HISTORY: Please see below for problem oriented charting. she returns for treatment follow-up with her mother I have been in contact with advised home care services related to an order that was placed from wound care I have reviewed documentation from wound care center The patient bring some of her pill bottles.  There are numerous medications left.  She denies taking Pepcid anymore and she felt she does not need it although she recently cancel her treatment because she  have gastritis. She has not been taking Remeron that was prescribed for appetite stimulant to help with weight gain.  She has not been taking metoprolol that was prescribed because of tachycardia.  She stated pain medicine was not helping When I counted her pills, there were significant amount of MS Contin and Dilaudid left.   The patient has not been taking her medications correctly She have lost 8 pounds since her last visit. She canceled her recent appointment because of feeling unwell The patient does not qualify for home health aide.  We spent a very long time today reviewing treatment recommendation  REVIEW OF SYSTEMS:   Eyes: Denies blurriness of vision Ears, nose, mouth, throat, and face: Denies mucositis or sore throat Respiratory: Denies cough, dyspnea or wheezes Cardiovascular: Denies palpitation, chest discomfort or lower extremity swelling Skin: Denies abnormal skin rashes Lymphatics: Denies new lymphadenopathy or easy bruising Neurological:Denies numbness, tingling or new weaknesses Behavioral/Psych: Mood is stable, no new changes  All other systems were reviewed with the patient and are negative.  I have reviewed the past medical history, past surgical history, social history and family history with the patient and they are unchanged from previous note.  ALLERGIES:  has No Known Allergies.  MEDICATIONS:  Current Outpatient Medications  Medication Sig Dispense Refill   calcium carbonate (TUMS - DOSED IN MG ELEMENTAL CALCIUM) 500 MG chewable tablet Chew 1 tablet by mouth 2 (two) times daily.     capecitabine (XELODA) 500 MG tablet Take 2 tablets (1,000 mg total) by mouth 2 (two) times daily after a meal. Take within 30 minutes after meals. Take for 14 days on, 7 days off. Repeat every 21 days. 56 tablet 11   cholecalciferol (VITAMIN D3) 25 MCG (1000 UNIT) tablet Take 2,000 Units by mouth daily.     HYDROmorphone (DILAUDID) 8 MG tablet Take 1 tablet (8 mg total) by mouth every 6 (six) hours as needed for moderate pain. 90 tablet 0   magnesium oxide (MAG-OX) 400 (240 Mg) MG tablet Take 1 tablet (400 mg total) by mouth daily. 30 tablet 3   morphine (MS CONTIN) 30 MG 12 hr tablet Take 1 tablet (30 mg total) by mouth every 12 (twelve) hours. 60 tablet 0   polyethylene glycol (MIRALAX / GLYCOLAX) 17 g  packet Take 17 g by mouth 2 (two) times daily.     senna (SENOKOT) 8.6 MG tablet Take 2 tablets by mouth 2 (two) times daily.     sodium hypochlorite (DAKIN'S 1/4 STRENGTH) 0.125 % SOLN Use as directed for wound care 1000 mL 6   No current facility-administered medications for this visit.    SUMMARY OF ONCOLOGIC HISTORY: Oncology History Overview Note  The tumor cells are EQUIVOCAL For Her2 (2+).   Estrogen Receptor:       POSITIVE, 100%, STRONG STAINING  Progesterone Receptor:   NEGATIVE (0)  Proliferation Marker Ki-67:   45%    Breast cancer, stage 4, right (HCC)  03/22/2022 Initial Diagnosis   Primary breast malignancy (HCC)   03/24/2022 Pathology Results   SURGICAL PATHOLOGY  CASE: [phone]  PATIENT: [name]  Surgical Pathology Report   Clinical History: Ulcerative right breast lesion and probable metastatic breast cancer ([facility])   FINAL MICROSCOPIC DIAGNOSIS:   A. SOFT TISSUE MASS, RIGHT UPPER CHEST WALL, NEEDLE CORE BIOPSY:  - Invasive ductal carcinoma, see comment  - Ductal carcinoma in situ: Not identified   - Tubule formation: Score 3  - Nuclear pleomorphism: Score 3  -  Mitotic count: Score 3  - Total score: 9  - Overall Grade: 3  - Lymphovascular invasion: Not identified  - Cancer Length: 13 mm  - Calcifications: Not identified  - Other findings: None    03/28/2022 Cancer Staging   Staging form: Breast, AJCC 8th Edition - Clinical stage from 03/28/2022: Stage IV (cT4d, cN3c, pM1, G3, ER+, PR-, HER2: Equivocal) - Signed by Artis Delay, MD on 03/28/2022 Stage prefix: Initial diagnosis Nuclear grade: G3 Histologic grading system: 3 grade system   04/17/2022 -  Chemotherapy   She started taking combination of abemaciclib with anastrozole   06/27/2022 Imaging   1. Ulcerated mass of the right breast with extension of nodular enhancing soft tissue to the right chest wall slight extension into the contralateral left breast, markedly decreased in size when  compared with prior exam. 2. Interval decreased size of left axillary lymph node. 3. Solid pulmonary nodules are decreased in size when compared with prior exam. 4. Large right pleural effusion with complete collapse of the right lower lobe, pleural effusion is decreased in size when compared with prior exam. 5. Stable low-attenuation liver lesions which are likely simple cysts. 6. Lytic and sclerotic osseous lesions again seen, many of which demonstrate increased sclerosis, likely due to treatment response. 7. Pathologic compression fracture of T3 demonstrates increased height loss, although there is no evidence of significant retropulsion. 8. Large stool ball in the rectum, correlate for constipation.     08/02/2022 - 08/30/2022 Hospital Admission   She was admitted to the hospital due to malignant pleural effusion and was diagnosed with MSSA empyema   08/10/2022 Imaging   1. Large loculated right hydropneumothorax encompassing about 75% of right hemithoracic volume. Pigtail right pleural drainage catheter in place. 2. Moderate to large left pleural effusion encompassing about 1/3 of the left hemithoracic volume. 3. Right axillary mass or lymph node measures 1.8 cm in short axis, formerly 1.9 cm. Other previously seen chest wall soft tissue lesions are less conspicuous on today's exam due to lack of IV contrast, but likely still present. 4. Bony destructive findings of the right fifth rib laterally compatible with metastatic disease. 5. Extensive sclerosis in the sternum favoring metastatic disease over infection. 6. Nearly complete central collapse of the T3 vertebral body with associated sclerosis and minimal posterior bony retropulsion, unchanged from prior. 7. Diffuse hazy edema/infiltration of the mediastinal and subcutaneous adipose tissues compatible with third spacing of fluid. 8. Aortic atherosclerosis.     01/13/2023 Imaging   CT CHEST ABDOMEN PELVIS W CONTRAST  Result Date:  01/12/2023 CLINICAL DATA:  Stage IV invasive breast cancer. Ongoing chemotherapy. Assess treatment response. * Tracking Code: BO * EXAM: CT CHEST, ABDOMEN, AND PELVIS WITH CONTRAST TECHNIQUE: Multidetector CT imaging of the chest, abdomen and pelvis was performed following the standard protocol during bolus administration of intravenous contrast. RADIATION DOSE REDUCTION: This exam was performed according to the departmental dose-optimization program which includes automated exposure control, adjustment of the mA and/or kV according to patient size and/or use of iterative reconstruction technique. CONTRAST:  OMNIPAQUE IOHEXOL 300 MG/ML  SOLN COMPARISON:  CT chest 08/10/2022. Chest abdomen pelvis CT 06/25/2022. Numerous prior x-rays FINDINGS: CT CHEST FINDINGS Cardiovascular: Right side of the heart is slightly enlarged. No pericardial effusion the thoracic aorta has a normal course and caliber. Mild vascular calcifications. Mediastinum/Nodes: Slightly patulous thoracic esophagus. Preserved thyroid gland. No abnormal lymph node enlargement seen in the left axillary region, left chest wall, left axillary region or mediastinum small  right hilar node on image 33 of series three, nonpathologic by size criteria slightly more prominent than usually seen. This was not as well seen on the prior CT scan of November 2023 but the study was without contrast. On the older study with contrast from 06/25/2022 this node was present and stable. Enlarged right axillary lymph nodes identified. On the study of November 2023 a node was measured at 18 by 28 mm and today this same node measures 2.7 x 1.5 cm on series 3, image 29. Lungs/Pleura: Left basilar atelectasis. No pneumothorax or effusion. No dominant left-sided lung nodule. There is some volume loss along the right hemithorax with a small effusion and pleural thickening. Small loculated pleural effusion at the right lateral lung base as well such as series 3, image 47  measuring 7.2 by 2.6 cm. Adjacent lung opacity. There is a nodule in the right lower lobe which is noncalcified on series 7, image 74 measuring 5 mm. This area was obscured by a more significant opacity on the prior CT scans of the larger effusion and pleural drain. Attention on follow-up. Is also a spiculated nodule right upper lobe today on series 7, image 32 measuring 7 by 4 mm. In retrospect this nodule was present in November 2023 measuring 6 3 mm. In September 2023 this measured 4 mm. Slowly increasing in size. Musculoskeletal: Curvature of the spine with some degenerative changes. There is areas of sclerosis seen along several vertebral bodies, the sternum and ribs with some lytic areas consistent with bone metastases. There is severe compression of the T3 level which is unchanged from previous. The areas of sclerosis involving T4 level in the sternum appears similar. There is diffuse anasarca with several soft tissue nodules identified in the right chest wall including dermal, subdermal along muscular. Many these areas are significantly increased. Example soft tissue nodule anteriorly on series 3, image 34 extending out to the skin measures 19 by 19 mm today and previously small amount of thickening would have measured up to 5 mm in thickness. Again several other areas are significantly increased. Previous right mastectomy. CT ABDOMEN PELVIS FINDINGS Hepatobiliary: Multiple hepatic cysts are again identified. Gallbladder is present. Patent portal vein. Pancreas: Unremarkable. No pancreatic ductal dilatation or surrounding inflammatory changes. Spleen: Normal in size without focal abnormality. Adrenals/Urinary Tract: The adrenal glands are preserved. Upper pole left-sided Bosniak 1 renal cyst again seen measuring 2.7 cm in diameter. Hounsfield unit of 5. No specific imaging follow-up. Tiny focus elsewhere as well in the mid left kidney. No imaging follow-up. These are unchanged from previous. Preserved  contours of the collapsed urinary bladder. Stomach/Bowel: Bowel is nondilated. Diffuse colonic stool. Cecum resides in the right hemipelvis. Presumed normal retrocecal air-filled appendix. Vascular/Lymphatic: Normal caliber aorta and IVC. No discrete abnormal lymph node enlargement identified in the abdomen and pelvis. Reproductive: Uterus and bilateral adnexa are unremarkable. Other: Anasarca.  No free air or free fluid identified. Musculoskeletal: Curvature of the spine. Multifocal degenerative changes of the spine and pelvis there is also lytic and sclerotic bone lesions identified along the pelvis and lumbar spine. Distribution by CT appears similar to the previous. Schmorl's node compression deformity again seen at L4. IMPRESSION: Worsening disease with multiple areas of nodular skin thickening along the right hemithorax and chest wall with additional underlying subcutaneous fat and muscular nodules. Persistent axillary lymph nodes. Small right pleural effusion seen with a loculated component on the right side of the lung base. Adjacent lung opacities. Small right-sided lung nodules identified  which were not as well seen on the prior, possibly due to the level of the lung opacity but are felt to be increasing slightly. Attention on short follow-up. Mixed lucent and sclerotic bone lesions are again identified with stable compression of T3. Electronically Signed   By: Karen Kays M.D.   On: 01/12/2023 15:09      03/19/2023 Imaging   CT CHEST ABDOMEN PELVIS W CONTRAST  Result Date: 03/19/2023 CLINICAL DATA:  Stage IV breast cancer. Restaging. * Tracking Code: BO * EXAM: CT CHEST, ABDOMEN, AND PELVIS WITH CONTRAST TECHNIQUE: Multidetector CT imaging of the chest, abdomen and pelvis was performed following the standard protocol during bolus administration of intravenous contrast. RADIATION DOSE REDUCTION: This exam was performed according to the departmental dose-optimization program which includes automated  exposure control, adjustment of the mA and/or kV according to patient size and/or use of iterative reconstruction technique. CONTRAST:  OMNIPAQUE IOHEXOL 300 MG/ML  SOLN COMPARISON:  01/09/2023 FINDINGS: CT CHEST FINDINGS Cardiovascular: The heart size is upper normal to borderline enlarged. No substantial pericardial effusion. Mediastinum/Nodes: No mediastinal lymphadenopathy. Small hilar lymph nodes evident bilaterally. The esophagus has normal imaging features. No left axillary lymphadenopathy. Lungs/Pleura: Volume loss with architectural distortion and pleuroparenchymal scarring in the inferior right lung is similar to prior. 7 mm right apical nodule identified previously is unchanged on 30/4 today. 5 mm nodule in the medial right base identified previously is stable on 76/4. Interval development of masslike consolidative opacity in the posterior left lower lobe (120/4) suspicious for pneumonia given lack of appreciable volume loss in the left lower lobe. Right pleural effusion is similar to prior although measured loculated component on the previous study is 7.2 x 2.6 cm is now 5.9 x 1.7 cm on 46/3. Musculoskeletal: The numerous right-sided predominant chest wall lesions are progressive in the interval. Index anterior right paramidline lesion measured previously at 1.9 x 1.9 cm is 3.1 x 2.5 cm today on image 31/3. Skin thickening lateral lower chest wall is 8 mm today on image 52/3 which corresponds to 4 mm when measuring at the same level on the prior study. Chest wall edema noted right greater than left. Sclerotic lesion in the anterior right eighth rib is similar to prior. Marked compression deformity at T3 again noted. Sclerotic sternal lesion is similar to prior. CT ABDOMEN PELVIS FINDINGS Hepatobiliary: Multiple hepatic cysts are again noted. Capsular irregularity along the hemidiaphragm is similar to prior. There is no evidence for gallstones, gallbladder wall thickening, or pericholecystic fluid.  No intrahepatic or extrahepatic biliary dilation. Pancreas: Interval development of diffuse distention of the main pancreatic duct measuring up to 4 mm diameter. No discernible obstructing mass lesion in the head of the pancreas. Spleen: No splenomegaly. No suspicious focal mass lesion. Adrenals/Urinary Tract: No adrenal nodule or mass. Opacification of the bilateral renal calices consistent with early excretion of contrast material. Small cyst noted upper pole left kidney, as before. No followup imaging is recommended. No evidence for hydroureter. The urinary bladder appears normal for the degree of distention. Stomach/Bowel: Stomach is moderately distended with fluid. Mild diffuse distention of small bowel and colon noted with moderate to large stool volume evident. Vascular/Lymphatic: No abdominal aortic aneurysm. There is no gastrohepatic or hepatoduodenal ligament lymphadenopathy. No retroperitoneal or mesenteric lymphadenopathy. No pelvic sidewall lymphadenopathy. 10 mm short axis right groin node on 125/3 is upper normal for size. Reproductive: Unremarkable. Other: No substantial intraperitoneal free fluid. Musculoskeletal: Similar appearance of the lesion in the right pubic bone.  Lucent lesion in the left L4 vertebral body is similar to prior. IMPRESSION: 1. Interval progression of the numerous right-sided predominant chest wall lesions. 2. Interval development of masslike consolidative opacity in the posterior left lower lobe suspicious for pneumonia given lack of appreciable volume loss in the left lower lobe. 3. No interval change in index right-sided pulmonary nodules. Continued attention on follow-up suggested. 4. Interval development of diffuse distention of the main pancreatic duct measuring up to 4 mm diameter. No discernible obstructing mass lesion in the head of the pancreas. Close attention on follow-up recommended. 5. Similar appearance of the osseous metastatic disease. 6. No substantial change  in right pleural effusion although measured loculated component on the previous study has decreased in the interval. 7. Mild diffuse distention of small bowel and colon with moderate to large stool volume. Correlation for signs/symptoms of clinical constipation suggested. Electronically Signed   By: Kennith Center M.D.   On: 03/19/2023 08:27      03/24/2023 -  Chemotherapy   She started on xeloda     PHYSICAL EXAMINATION: ECOG PERFORMANCE STATUS: 2 - Symptomatic, <50% confined to bed  Vitals:   05/08/23 1528  BP: 117/77  Pulse: (!) 125  Resp: 18  SpO2: 100%   Filed Weights   05/08/23 1528  Weight: 127 lb 6.4 oz (57.8 kg)    GENERAL:alert, no distress and comfortable NEURO: alert & oriented x 3 with fluent speech, no focal motor/sensory deficits  LABORATORY DATA:  I have reviewed the data as listed    Component Value Date/Time   NA 138 05/08/2023 1510   K 4.3 05/08/2023 1510   CL 102 05/08/2023 1510   CO2 29 05/08/2023 1510   GLUCOSE 128 (H) 05/08/2023 1510   BUN 19 05/08/2023 1510   CREATININE 1.06 (H) 05/08/2023 1510   CREATININE 1.03 (H) 11/28/2022 1242   CALCIUM 8.6 (L) 05/08/2023 1510   PROT 6.4 (L) 05/08/2023 1510   ALBUMIN 3.2 (L) 05/08/2023 1510   AST 33 05/08/2023 1510   AST 22 11/28/2022 1242   ALT 6 05/08/2023 1510   ALT 13 11/28/2022 1242   ALKPHOS 64 05/08/2023 1510   BILITOT 0.3 05/08/2023 1510   BILITOT 0.5 11/28/2022 1242   GFRNONAA 59 (L) 05/08/2023 1510   GFRNONAA >60 11/28/2022 1242    No results found for: "SPEP", "UPEP"  Lab Results  Component Value Date   WBC 8.4 05/08/2023   NEUTROABS 5.5 05/08/2023   HGB 9.0 (L) 05/08/2023   HCT 28.0 (L) 05/08/2023   MCV 83.8 05/08/2023   PLT 494 (H) 05/08/2023      Chemistry      Component Value Date/Time   NA 138 05/08/2023 1510   K 4.3 05/08/2023 1510   CL 102 05/08/2023 1510   CO2 29 05/08/2023 1510   BUN 19 05/08/2023 1510   CREATININE 1.06 (H) 05/08/2023 1510   CREATININE 1.03 (H)  11/28/2022 1242      Component Value Date/Time   CALCIUM 8.6 (L) 05/08/2023 1510   ALKPHOS 64 05/08/2023 1510   AST 33 05/08/2023 1510   AST 22 11/28/2022 1242   ALT 6 05/08/2023 1510   ALT 13 11/28/2022 1242   BILITOT 0.3 05/08/2023 1510   BILITOT 0.5 11/28/2022 1242

## 2023-05-08 NOTE — Assessment & Plan Note (Signed)
We have extensive discussion about pain control The patient repeatedly stated that her pain is not well-controlled She brought all her pill bottles with her Her MS Contin was refilled a month ago and she still have at least 30 pills or more This is similar with her Dilaudid.  She is claims she is taking it as prescribed but she have over 30 pills and have not refilled her prescription since June Overall, I felt that she is noncompliant taking her pain medication as prescribed For this reason, I am not willing to change her prescription

## 2023-05-08 NOTE — Assessment & Plan Note (Signed)
She has progressive weight loss Previously I have prescribed Remeron but the patient has not been taking it She has not been eating well and not been taking her nutritional supplement

## 2023-05-08 NOTE — Assessment & Plan Note (Signed)
Overall, she tolerated Xeloda well Recently, will increase the dose of her Xeloda She is due to pick up the new prescription I will see her back in 2 weeks for further follow-up Reinforced the importance of not canceling her appointment

## 2023-05-08 NOTE — Assessment & Plan Note (Signed)
I have numerous goals of care discussion with the patient and family in the past The patient is declining due to inability to take care of herself and inability to take medications and treatment as prescribed I felt that she is on rapid decline and I do not believe she is going to survive this cancer without additional help The patient does not want to stop treatment I will readdress goals of care again in 2 weeks

## 2023-05-09 ENCOUNTER — Other Ambulatory Visit (HOSPITAL_COMMUNITY): Payer: Self-pay

## 2023-05-11 ENCOUNTER — Inpatient Hospital Stay: Payer: BC Managed Care – PPO

## 2023-05-11 ENCOUNTER — Other Ambulatory Visit: Payer: Self-pay

## 2023-05-11 ENCOUNTER — Telehealth: Payer: Self-pay

## 2023-05-11 DIAGNOSIS — C50911 Malignant neoplasm of unspecified site of right female breast: Secondary | ICD-10-CM

## 2023-05-11 NOTE — Telephone Encounter (Signed)
Returned her call and left a message asking her to call the office back. She left a message asking for a call back.

## 2023-05-11 NOTE — Telephone Encounter (Signed)
When I checked her pill bottles, she has over 30 pills one each of them last week I do not plan to refill them this week

## 2023-05-11 NOTE — Telephone Encounter (Signed)
Called and left below message. Ask her to call the office for questions. ?

## 2023-05-11 NOTE — Telephone Encounter (Signed)
She called back. Requesting Dilaudid and MS Contin Rx to [facility] outpatient pharmacy please.

## 2023-05-11 NOTE — Progress Notes (Signed)
[facility] Clinical Social Work  Clinical Social Work was referred by Engineer, civil (consulting) for resources for facility placements.  Clinical Social Worker contacted patient by phone to discuss needs for facility placements.  Patient confirmed desire for facility placement at a short term rehab facility. Patient stated about safety with navigating getting in and out the bathtub / shower and navigating steps at her home. Patient denied any current safety measures in place to prevent falls such as handrails in shower or Medical Alert Necklaces. Patient only experiences with home care was for Wound Care Assistance (1x pt discontinued due to her not qualifying for services). Patient lives alone. Patient stated her goals with getting connected to short term rehab is to eventually come home and be able to navigate around the home. Patient denied any SDOH needs at this time.   Interventions: CSW called [facility] (Pt. Request) and [facility] (sister location of [facility]) to locate a placement with an opening. CSW was unable to reach any staff members at these locations.   Next Steps:   CSW will continue to attempt to find a facility with a placement.    Marguerita Merles, LCSWA Clinical Social Worker [facility]

## 2023-05-13 ENCOUNTER — Telehealth: Payer: Self-pay

## 2023-05-13 ENCOUNTER — Inpatient Hospital Stay: Payer: BC Managed Care – PPO

## 2023-05-13 NOTE — Telephone Encounter (Signed)
Received call from [facility] stating that per Pt, "the pain medication is not working" and asks if any other medications can be ordered. Per MD, Pt was seen on 05/08/23 with [name] present and was observed to not be taking pain medications correctly (pill bottles present for office visit). Pt then called the following Monday for pain rx refills further reiterating that medication is not being taken correctly as MD visualized the amount of pills present on 05/08/23.   Per MD, Pt is to take MS contin BID and dilaudid as breakthrough every 6 hrs and call on 05/15/23 with update as no new pain medications will be ordered.  Called [name] and relayed above message. [name] states that Pt lives alone and she is going off of the Pt's word that medications are being taken correctly. Advised that this RN will call Pt to discuss.  Called Pt who verbalizes she is taking MS contin BID and taking dilaudid 1 tab Q6hrs around the clock with last dilaudid taken 20 min ago. Advised Pt to call back on Friday with update. Pt verbalized understanding.

## 2023-05-13 NOTE — Telephone Encounter (Signed)
[facility] Clinical Social Work  CSW attempted to contact patient following a missed call. No answer,unable to leave  vm with direct contact information.   Marguerita Merles, LCSWA Clinical Social Worker [facility]

## 2023-05-13 NOTE — Progress Notes (Signed)
[facility] CSW Progress Note  Clinical Child psychotherapist contacted patient by phone to follow up on missed call. CSW updated patient on the task for locating short-term rehab facilities. CSW informed patient she contacted two facilities and have not heard back. CSW informed patient next steps is for Dr. Bertis Ruddy (No PCP) to send orders to facilities. CSW sent message to nurse working with Dr. Bertis Ruddy.  Next Steps: CSW will follow up with patient when she hears back from Dr. Maxine Glenn team.   Marguerita Merles, LCSWA Clinical Social Worker [facility]

## 2023-05-14 ENCOUNTER — Inpatient Hospital Stay: Payer: BC Managed Care – PPO

## 2023-05-14 NOTE — Progress Notes (Signed)
[facility] CSW Progress Note  CSW consulted with supervisor L. Somers about referral on SNF placement for patient. CSW spoke contact that explained requirements for admission with current insurance.CSW relayed information to medical team and stated priority is wound care.  CSW explained process to patient and explored what would be the right option for her. Patient stated that she would prefer to do private pay and understands cost that are associated with placement. Patient stated she would like CSW to request orders be sent in case private pay does not become an option. CSW provided contact for [facility] that may assist and will mail out additional resources with options. Patient expressed concern about weight loss and prefers to be in a facility that will assist with ensuring she is fed. CSW offered to send a referral in for a dietician to help assist after placement, patient agreed.   Next Steps:  CSW will mail out facility options.  CSW will follow up with patient next week to check in on progress.  [name], LCSWA Clinical Social Worker [facility]

## 2023-05-15 ENCOUNTER — Telehealth: Payer: Self-pay

## 2023-05-15 ENCOUNTER — Inpatient Hospital Stay: Payer: BC Managed Care – PPO

## 2023-05-15 NOTE — Telephone Encounter (Signed)
[name] returned call. She has already spoken with the social worker regarding price for inpatient SNF's and price for assisted living facilities.  She is currently taking Dilaudid 4 times a day and MS Contin 2 x a day. She is still complaining of a lot of pain and not feeling better.

## 2023-05-15 NOTE — Progress Notes (Signed)
[facility] CSW Progress Note  Clinical Child psychotherapist contacted patient by phone to follow up with request for SNF placement. CSW informed patient of the insurance process of obtaining a SNF and then explored the process of setting up a home care team (private pay) to meet the priorities named by patient and medical team. Patient informed CSW she would like to proceed with SNF placement despite no guarantee with insurance approving service. CSW is collaborating with medical team and [employer]. Per [employer] case manager,  patient does not meet qualifications for placement. Hospice / Home health will be explored.   Marguerita Merles, LCSWA Clinical Social Worker [facility]

## 2023-05-15 NOTE — Telephone Encounter (Signed)
[name] with Home health called and left a message. [name] will be d/ced from home health today. She has declined all visits this week.

## 2023-05-15 NOTE — Telephone Encounter (Signed)
Called her back and per Dr. Bertis Ruddy, instructed her to take MS Contin 3 x a day and Dilaudid 4 x a day until next appt. She verbalized understanding and will call the office back for questions.

## 2023-05-18 ENCOUNTER — Telehealth: Payer: Self-pay

## 2023-05-18 ENCOUNTER — Ambulatory Visit (HOSPITAL_BASED_OUTPATIENT_CLINIC_OR_DEPARTMENT_OTHER): Payer: BC Managed Care – PPO | Admitting: General Surgery

## 2023-05-18 NOTE — Telephone Encounter (Signed)
Called Mom, [name] and given below message that Orelia was instructed to go to the ER today to be evaluated. She said that she spoke with [name] and she did not mention anything. [name] will encourage Shylan to go the ER.

## 2023-05-18 NOTE — Telephone Encounter (Signed)
Called to follow up on pain. She is still complaining of a lot of pain. Instructed per Dr. Bertis Ruddy, to go the ER due to pain and possibly be admitted for pain control. Offered to call her mother, she declined and will call her. She verbalized understanding and  will go the the ER this am at [facility].

## 2023-05-19 ENCOUNTER — Encounter: Payer: Self-pay | Admitting: Hematology and Oncology

## 2023-05-19 ENCOUNTER — Emergency Department (HOSPITAL_COMMUNITY): Payer: BC Managed Care – PPO

## 2023-05-19 ENCOUNTER — Other Ambulatory Visit: Payer: Self-pay

## 2023-05-19 ENCOUNTER — Encounter (HOSPITAL_COMMUNITY): Payer: Self-pay | Admitting: Family Medicine

## 2023-05-19 ENCOUNTER — Inpatient Hospital Stay (HOSPITAL_COMMUNITY)
Admission: EM | Admit: 2023-05-19 | Discharge: 2023-05-30 | DRG: 947 | Disposition: A | Discharge status: Skilled Nursing Facility | Payer: BC Managed Care – PPO | Attending: Student | Admitting: Student

## 2023-05-19 DIAGNOSIS — Z841 Family history of disorders of kidney and ureter: Secondary | ICD-10-CM

## 2023-05-19 DIAGNOSIS — E876 Hypokalemia: Secondary | ICD-10-CM | POA: Diagnosis present

## 2023-05-19 DIAGNOSIS — Z79899 Other long term (current) drug therapy: Secondary | ICD-10-CM

## 2023-05-19 DIAGNOSIS — E860 Dehydration: Secondary | ICD-10-CM | POA: Diagnosis present

## 2023-05-19 DIAGNOSIS — R54 Age-related physical debility: Secondary | ICD-10-CM | POA: Diagnosis present

## 2023-05-19 DIAGNOSIS — J9811 Atelectasis: Secondary | ICD-10-CM | POA: Diagnosis present

## 2023-05-19 DIAGNOSIS — B9561 Methicillin susceptible Staphylococcus aureus infection as the cause of diseases classified elsewhere: Secondary | ICD-10-CM | POA: Diagnosis present

## 2023-05-19 DIAGNOSIS — S21101A Unspecified open wound of right front wall of thorax without penetration into thoracic cavity, initial encounter: Secondary | ICD-10-CM | POA: Diagnosis present

## 2023-05-19 DIAGNOSIS — G893 Neoplasm related pain (acute) (chronic): Principal | ICD-10-CM

## 2023-05-19 DIAGNOSIS — Z91128 Patient's intentional underdosing of medication regimen for other reason: Secondary | ICD-10-CM

## 2023-05-19 DIAGNOSIS — Z91199 Patient's noncompliance with other medical treatment and regimen due to unspecified reason: Secondary | ICD-10-CM

## 2023-05-19 DIAGNOSIS — C799 Secondary malignant neoplasm of unspecified site: Secondary | ICD-10-CM | POA: Diagnosis present

## 2023-05-19 DIAGNOSIS — E861 Hypovolemia: Secondary | ICD-10-CM | POA: Diagnosis present

## 2023-05-19 DIAGNOSIS — J90 Pleural effusion, not elsewhere classified: Secondary | ICD-10-CM | POA: Diagnosis present

## 2023-05-19 DIAGNOSIS — R000 Tachycardia, unspecified: Secondary | ICD-10-CM | POA: Diagnosis present

## 2023-05-19 DIAGNOSIS — L989 Disorder of the skin and subcutaneous tissue, unspecified: Secondary | ICD-10-CM | POA: Diagnosis present

## 2023-05-19 DIAGNOSIS — Z515 Encounter for palliative care: Secondary | ICD-10-CM

## 2023-05-19 DIAGNOSIS — C50911 Malignant neoplasm of unspecified site of right female breast: Secondary | ICD-10-CM | POA: Diagnosis not present

## 2023-05-19 DIAGNOSIS — Z8249 Family history of ischemic heart disease and other diseases of the circulatory system: Secondary | ICD-10-CM

## 2023-05-19 DIAGNOSIS — J189 Pneumonia, unspecified organism: Secondary | ICD-10-CM | POA: Diagnosis present

## 2023-05-19 DIAGNOSIS — S279XXA Injury of unspecified intrathoracic organ, initial encounter: Secondary | ICD-10-CM | POA: Diagnosis present

## 2023-05-19 DIAGNOSIS — Z91148 Patient's other noncompliance with medication regimen for other reason: Secondary | ICD-10-CM

## 2023-05-19 DIAGNOSIS — Z681 Body mass index (BMI) 19 or less, adult: Secondary | ICD-10-CM

## 2023-05-19 DIAGNOSIS — Z823 Family history of stroke: Secondary | ICD-10-CM

## 2023-05-19 DIAGNOSIS — C50811 Malignant neoplasm of overlapping sites of right female breast: Secondary | ICD-10-CM | POA: Diagnosis present

## 2023-05-19 DIAGNOSIS — D630 Anemia in neoplastic disease: Secondary | ICD-10-CM | POA: Diagnosis present

## 2023-05-19 DIAGNOSIS — D75839 Thrombocytosis, unspecified: Secondary | ICD-10-CM | POA: Diagnosis present

## 2023-05-19 DIAGNOSIS — Z66 Do not resuscitate: Secondary | ICD-10-CM | POA: Diagnosis present

## 2023-05-19 DIAGNOSIS — E43 Unspecified severe protein-calorie malnutrition: Secondary | ICD-10-CM | POA: Diagnosis present

## 2023-05-19 DIAGNOSIS — T148XXD Other injury of unspecified body region, subsequent encounter: Secondary | ICD-10-CM | POA: Diagnosis not present

## 2023-05-19 DIAGNOSIS — R52 Pain, unspecified: Secondary | ICD-10-CM | POA: Diagnosis present

## 2023-05-19 DIAGNOSIS — Z833 Family history of diabetes mellitus: Secondary | ICD-10-CM

## 2023-05-19 DIAGNOSIS — I10 Essential (primary) hypertension: Secondary | ICD-10-CM | POA: Diagnosis present

## 2023-05-19 DIAGNOSIS — Z7189 Other specified counseling: Secondary | ICD-10-CM

## 2023-05-19 DIAGNOSIS — K5900 Constipation, unspecified: Secondary | ICD-10-CM | POA: Diagnosis present

## 2023-05-19 DIAGNOSIS — X58XXXA Exposure to other specified factors, initial encounter: Secondary | ICD-10-CM | POA: Diagnosis present

## 2023-05-19 DIAGNOSIS — R64 Cachexia: Secondary | ICD-10-CM | POA: Diagnosis present

## 2023-05-19 LAB — CBC WITH DIFFERENTIAL/PLATELET
Abs Immature Granulocytes: 0.01 10*3/uL (ref 0.00–0.07)
Basophils Absolute: 0 10*3/uL (ref 0.0–0.1)
Basophils Relative: 1 %
Eosinophils Absolute: 0 10*3/uL (ref 0.0–0.5)
Eosinophils Relative: 0 %
HCT: 29 % — ABNORMAL LOW (ref 36.0–46.0)
Hemoglobin: 9.1 g/dL — ABNORMAL LOW (ref 12.0–15.0)
Immature Granulocytes: 0 %
Lymphocytes Relative: 14 %
Lymphs Abs: 0.8 10*3/uL (ref 0.7–4.0)
MCH: 27.2 pg (ref 26.0–34.0)
MCHC: 31.4 g/dL (ref 30.0–36.0)
MCV: 86.6 fL (ref 80.0–100.0)
Monocytes Absolute: 0.6 10*3/uL (ref 0.1–1.0)
Monocytes Relative: 10 %
Neutro Abs: 4.4 10*3/uL (ref 1.7–7.7)
Neutrophils Relative %: 75 %
Platelets: 544 10*3/uL — ABNORMAL HIGH (ref 150–400)
RBC: 3.35 MIL/uL — ABNORMAL LOW (ref 3.87–5.11)
RDW: 15.9 % — ABNORMAL HIGH (ref 11.5–15.5)
WBC: 5.8 10*3/uL (ref 4.0–10.5)
nRBC: 0 % (ref 0.0–0.2)

## 2023-05-19 LAB — COMPREHENSIVE METABOLIC PANEL
ALT: 12 U/L (ref 0–44)
AST: 43 U/L — ABNORMAL HIGH (ref 15–41)
Albumin: 2.7 g/dL — ABNORMAL LOW (ref 3.5–5.0)
Alkaline Phosphatase: 73 U/L (ref 38–126)
Anion gap: 12 (ref 5–15)
BUN: 20 mg/dL (ref 8–23)
CO2: 24 mmol/L (ref 22–32)
Calcium: 8.3 mg/dL — ABNORMAL LOW (ref 8.9–10.3)
Chloride: 99 mmol/L (ref 98–111)
Creatinine, Ser: 0.97 mg/dL (ref 0.44–1.00)
GFR, Estimated: >60 mL/min (ref >60)
Glucose, Bld: 141 mg/dL — ABNORMAL HIGH (ref 70–99)
Potassium: 3.7 mmol/L (ref 3.5–5.1)
Sodium: 135 mmol/L (ref 135–145)
Total Bilirubin: 0.6 mg/dL (ref 0.3–1.2)
Total Protein: 6.6 g/dL (ref 6.5–8.1)

## 2023-05-19 MED ORDER — KETAMINE HCL 50 MG/5ML IJ SOSY
15.0000 mg | PREFILLED_SYRINGE | Freq: Once | INTRAMUSCULAR | Status: AC
  Administered 2023-05-19: 15 mg via INTRAVENOUS
  Filled 2023-05-19: qty 5

## 2023-05-19 MED ORDER — HYDROMORPHONE HCL 1 MG/ML IJ SOLN
1.0000 mg | INTRAMUSCULAR | Status: DC | PRN
  Administered 2023-05-20: 1 mg via INTRAVENOUS
  Administered 2023-05-20: 2 mg via INTRAVENOUS
  Filled 2023-05-19: qty 2
  Filled 2023-05-19: qty 1

## 2023-05-19 MED ORDER — ONDANSETRON HCL 4 MG PO TABS: 4.0000 mg | ORAL_TABLET | Freq: Four times a day (QID) | ORAL | Status: DC | PRN

## 2023-05-19 MED ORDER — SODIUM CHLORIDE 0.9% FLUSH
3.0000 mL | Freq: Two times a day (BID) | INTRAVENOUS | Status: DC
  Administered 2023-05-19 – 2023-05-30 (×15): 3 mL via INTRAVENOUS

## 2023-05-19 MED ORDER — SODIUM CHLORIDE 0.9 % IV BOLUS
1000.0000 mL | Freq: Once | INTRAVENOUS | Status: AC
  Administered 2023-05-19: 1000 mL via INTRAVENOUS

## 2023-05-19 MED ORDER — SENNA 8.6 MG PO TABS
2.0000 | ORAL_TABLET | Freq: Two times a day (BID) | ORAL | Status: DC
  Administered 2023-05-20 – 2023-05-29 (×9): 17.2 mg via ORAL
  Filled 2023-05-19 (×17): qty 2

## 2023-05-19 MED ORDER — ACETAMINOPHEN 325 MG PO TABS
650.0000 mg | ORAL_TABLET | Freq: Four times a day (QID) | ORAL | Status: DC | PRN
  Administered 2023-05-30: 650 mg via ORAL

## 2023-05-19 MED ORDER — ENOXAPARIN SODIUM 40 MG/0.4ML IJ SOSY
40.0000 mg | PREFILLED_SYRINGE | INTRAMUSCULAR | Status: DC
  Administered 2023-05-19 – 2023-05-21 (×3): 40 mg via SUBCUTANEOUS
  Filled 2023-05-19 (×3): qty 0.4

## 2023-05-19 MED ORDER — CAPECITABINE 500 MG PO TABS: 1000.0000 mg | ORAL_TABLET | Freq: Two times a day (BID) | ORAL | Status: DC

## 2023-05-19 MED ORDER — ACETAMINOPHEN 650 MG RE SUPP: 650.0000 mg | Freq: Four times a day (QID) | RECTAL | Status: DC | PRN

## 2023-05-19 MED ORDER — ONDANSETRON HCL 4 MG/2ML IJ SOLN
4.0000 mg | Freq: Once | INTRAMUSCULAR | Status: AC
  Administered 2023-05-19: 4 mg via INTRAVENOUS
  Filled 2023-05-19: qty 2

## 2023-05-19 MED ORDER — HYDROMORPHONE HCL 2 MG/ML IJ SOLN
2.0000 mg | Freq: Once | INTRAMUSCULAR | Status: AC
  Administered 2023-05-19: 2 mg via INTRAVENOUS
  Filled 2023-05-19: qty 1

## 2023-05-19 MED ORDER — BISACODYL 5 MG PO TBEC: 5.0000 mg | DELAYED_RELEASE_TABLET | Freq: Every day | ORAL | Status: DC | PRN

## 2023-05-19 MED ORDER — POLYETHYLENE GLYCOL 3350 17 G PO PACK
17.0000 g | PACK | Freq: Two times a day (BID) | ORAL | Status: DC
  Administered 2023-05-20 – 2023-05-30 (×16): 17 g via ORAL
  Filled 2023-05-19 (×20): qty 1

## 2023-05-19 MED ORDER — SODIUM CHLORIDE 0.9 % IV SOLN: INTRAVENOUS | Status: DC

## 2023-05-19 MED ORDER — ONDANSETRON HCL 4 MG/2ML IJ SOLN
4.0000 mg | Freq: Four times a day (QID) | INTRAMUSCULAR | Status: DC | PRN
  Administered 2023-05-20 – 2023-05-24 (×2): 4 mg via INTRAVENOUS
  Filled 2023-05-19 (×2): qty 2

## 2023-05-19 MED ORDER — MORPHINE SULFATE ER 15 MG PO TBCR
30.0000 mg | EXTENDED_RELEASE_TABLET | Freq: Two times a day (BID) | ORAL | Status: DC
  Administered 2023-05-19 – 2023-05-22 (×6): 30 mg via ORAL
  Filled 2023-05-19 (×6): qty 2

## 2023-05-19 NOTE — ED Triage Notes (Signed)
Pt reports palpitations since this morning. Pt has draining wounds on chest and back hx breast cancer

## 2023-05-19 NOTE — ED Notes (Signed)
ED TO INPATIENT HANDOFF REPORT  ED Nurse Name and Phone #:  Leatrice Jewels, RN  S Name/Age/Gender [name] 62 y.o. female Room/Bed: RESB/RESB  Code Status   Code Status: DNR  Home/SNF/Other Home Patient oriented to: self, place, time, and situation Is this baseline? Yes   Triage Complete: Triage complete  Chief Complaint Intractable pain [R52]  Triage Note Pt reports palpitations since this morning. Pt has draining wounds on chest and back hx breast cancer    Allergies No Known Allergies  Level of Care/Admitting Diagnosis ED Disposition     ED Disposition  Admit   Condition  --   Comment  Hospital Area: [facility]  HOSPITAL [[id number]]  Level of Care: Telemetry [5]  Admit to tele based on following criteria: Complex arrhythmia (Bradycardia/Tachycardia)  May place patient in observation at [facility] or [facility] if equivalent level of care is available:: Yes  Covid Evaluation: Asymptomatic - no recent exposure (last 10 days) testing not required  Diagnosis: Intractable pain [708987]  Admitting Physician: Briscoe Deutscher [[id number]]  Attending Physician: Briscoe Deutscher [[id number]]          B Medical/Surgery History Past Medical History:  Diagnosis Date   Hypertension    rt breast ca 02/2022   Past Surgical History:  Procedure Laterality Date   ANKLE SURGERY     IR THORACENTESIS ASP PLEURAL SPACE W/IMG GUIDE  04/10/2022   IR US GUIDE BX ASP/DRAIN  03/24/2022     A IV Location/Drains/Wounds Patient Lines/Drains/Airways Status     Active Line/Drains/Airways     Name Placement date Placement time Site Days   Peripheral IV 08/12/22 20 G 2.5" Left;Upper Arm 08/12/22  0631  Arm  280   Peripheral IV 05/19/23 20 G 1" Anterior;Left;Proximal Forearm 05/19/23  1811  Forearm  less than 1   External Urinary Catheter 08/24/22  0230  --  268   Wound / Incision (Open or Dehisced) 03/23/22 Non-pressure wound Breast Right Open across R breast, weeping  03/23/22  1000  Breast  422            Intake/Output Last 24 hours No intake or output data in the 24 hours ending 05/19/23 2009  Labs/Imaging Results for orders placed or performed during the hospital encounter of 05/19/23 (from the past 48 hour(s))  CBC with Differential     Status: Abnormal   Collection Time: 05/19/23  6:10 PM  Result Value Ref Range   WBC 5.8 4.0 - 10.5 K/uL   RBC 3.35 (L) 3.87 - 5.11 MIL/uL   Hemoglobin 9.1 (L) 12.0 - 15.0 g/dL   HCT 62.1 (L) 30.8 - 65.7 %   MCV 86.6 80.0 - 100.0 fL   MCH 27.2 26.0 - 34.0 pg   MCHC 31.4 30.0 - 36.0 g/dL   RDW 84.6 (H) 96.2 - 95.2 %   Platelets 544 (H) 150 - 400 K/uL   nRBC 0.0 0.0 - 0.2 %   Neutrophils Relative % 75 %   Neutro Abs 4.4 1.7 - 7.7 K/uL   Lymphocytes Relative 14 %   Lymphs Abs 0.8 0.7 - 4.0 K/uL   Monocytes Relative 10 %   Monocytes Absolute 0.6 0.1 - 1.0 K/uL   Eosinophils Relative 0 %   Eosinophils Absolute 0.0 0.0 - 0.5 K/uL   Basophils Relative 1 %   Basophils Absolute 0.0 0.0 - 0.1 K/uL   Immature Granulocytes 0 %   Abs Immature Granulocytes 0.01 0.00 - 0.07 K/uL  Comment: Performed at [facility], [address], [place], [place] [address]  Comprehensive metabolic panel     Status: Abnormal   Collection Time: 05/19/23  6:10 PM  Result Value Ref Range   Sodium 135 135 - 145 mmol/L   Potassium 3.7 3.5 - 5.1 mmol/L   Chloride 99 98 - 111 mmol/L   CO2 24 22 - 32 mmol/L   Glucose, Bld 141 (H) 70 - 99 mg/dL    Comment: Glucose reference range applies only to samples taken after fasting for at least 8 hours.   BUN 20 8 - 23 mg/dL   Creatinine, Ser 9.14 0.44 - 1.00 mg/dL   Calcium 8.3 (L) 8.9 - 10.3 mg/dL   Total Protein 6.6 6.5 - 8.1 g/dL   Albumin 2.7 (L) 3.5 - 5.0 g/dL   AST 43 (H) 15 - 41 U/L   ALT 12 0 - 44 U/L   Alkaline Phosphatase 73 38 - 126 U/L   Total Bilirubin 0.6 0.3 - 1.2 mg/dL   GFR, Estimated >78 >29 mL/min    Comment: (NOTE) Calculated using the CKD-EPI  Creatinine Equation (2021)    Anion gap 12 5 - 15    Comment: Performed at [facility], [address], [place], [place] [address]   DG Chest Port 1 View  Result Date: 05/19/2023 CLINICAL DATA:  Chest pain and palpitations EXAM: PORTABLE CHEST 1 VIEW COMPARISON:  Radiographs 08/22/2022 and CT chest 03/16/2023 FINDINGS: Stable cardiomediastinal silhouette. The right hilar fullness is likely at least in part due to the right chest wall lesions better seen on CT 03/16/2023. Right perihilar and lower lobe consolidation. Small right pleural effusion. The left lung is clear. No pneumothorax. IMPRESSION: 1. Right perihilar and lower lobe atelectasis or pneumonia with small right pleural effusion. 2. Right hilar fullness is in at least part due to the right chest wall lesions better seen on CT 03/16/2023. Electronically Signed   By: Minerva Fester M.D.   On: 05/19/2023 19:17    Pending Labs Unresulted Labs (From admission, onward)     Start     Ordered   05/26/23 0500  Creatinine, serum  (enoxaparin (LOVENOX)    CrCl >/= 30 ml/min)  Weekly,   R     Comments: while on enoxaparin therapy    05/19/23 1940   05/20/23 0500  HIV Antibody (routine testing w rflx)  (HIV Antibody (Routine testing w reflex) panel)  Tomorrow morning,   R        05/19/23 1940   05/20/23 0500  Basic metabolic panel  Tomorrow morning,   R        05/19/23 1940   05/20/23 0500  CBC  Tomorrow morning,   R        05/19/23 1940   05/20/23 0500  Magnesium  Tomorrow morning,   R        05/19/23 1940            Vitals/Pain Today's Vitals   05/19/23 1915 05/19/23 1930 05/19/23 1945 05/19/23 2000  BP: 135/82 121/74 114/68 110/65  Pulse: (!) 125 (!) 120 (!) 117 (!) 122  Resp: 17 17 (!) 21 (!) 28  Temp:      SpO2: 100% 100% 100% 100%  Weight:      Height:      PainSc:        Isolation Precautions No active isolations  Medications Medications  morphine (MS CONTIN) 12 hr tablet 30 mg (  has no  administration in time range)  capecitabine (XELODA) tablet 1,000 mg (has no administration in time range)  senna (SENOKOT) tablet 17.2 mg (has no administration in time range)  polyethylene glycol (MIRALAX / GLYCOLAX) packet 17 g (has no administration in time range)  enoxaparin (LOVENOX) injection 40 mg (has no administration in time range)  sodium chloride flush (NS) 0.9 % injection 3 mL (has no administration in time range)  acetaminophen (TYLENOL) tablet 650 mg (has no administration in time range)    Or  acetaminophen (TYLENOL) suppository 650 mg (has no administration in time range)  HYDROmorphone (DILAUDID) injection 1-2 mg (has no administration in time range)  bisacodyl (DULCOLAX) EC tablet 5 mg (has no administration in time range)  ondansetron (ZOFRAN) tablet 4 mg (has no administration in time range)    Or  ondansetron (ZOFRAN) injection 4 mg (has no administration in time range)  HYDROmorphone (DILAUDID) injection 2 mg (2 mg Intravenous Given 05/19/23 1813)  ondansetron (ZOFRAN) injection 4 mg (4 mg Intravenous Given 05/19/23 1813)  sodium chloride 0.9 % bolus 1,000 mL (1,000 mLs Intravenous New Bag/Given 05/19/23 1812)  ketamine 50 mg in normal saline 5 mL (10 mg/mL) syringe (15 mg Intravenous Given 05/19/23 1905)    Mobility walks     Focused Assessments     R Recommendations: See Admitting Provider Note  Report given to:   Additional Notes:

## 2023-05-19 NOTE — ED Provider Notes (Signed)
[facility]ERGENCY DEPARTMENT AT [facility] Provider Note   CSN: [id number] Arrival date & time: 05/19/23  1745     History  Chief Complaint  Patient presents with   Palpitations    [name] is a 62 y.o. female.  62 yo F with a chief complaint of right-sided chest discomfort.  The patient has breast cancer and has multiple draining wounds on her chest that is being seen at the wound care center.  She is currently on 8 mg Dilaudid tablets at home and then does periodic morphine infusions.  She tells me that her pain is not well-controlled and after some convincing by her oncologist has decided to come into the acutely treated for her pain.  She denies anything else currently bothering her.  Denies cough congestion or fever.  Denies nausea vomiting or diarrhea.   Palpitations      Home Medications Prior to Admission medications   Medication Sig Start Date End Date Taking? Authorizing Provider  calcium carbonate (TUMS - DOSED IN MG ELEMENTAL CALCIUM) 500 MG chewable tablet Chew 1 tablet by mouth 2 (two) times daily.    [provider]  capecitabine (XELODA) 500 MG tablet Take 2 tablets (1,000 mg total) by mouth 2 (two) times daily after a meal. Take within 30 minutes after meals. Take for 14 days on, 7 days off. Repeat every 21 days. 04/17/23   Artis Delay, MD  cholecalciferol (VITAMIN D3) 25 MCG (1000 UNIT) tablet Take 2,000 Units by mouth daily.    [provider]  HYDROmorphone (DILAUDID) 8 MG tablet Take 1 tablet (8 mg total) by mouth every 6 (six) hours as needed for moderate pain. 03/19/23   Artis Delay, MD  magnesium oxide (MAG-OX) 400 (240 Mg) MG tablet Take 1 tablet (400 mg total) by mouth daily. 06/12/22   Artis Delay, MD  morphine (MS CONTIN) 30 MG 12 hr tablet Take 1 tablet (30 mg total) by mouth every 12 (twelve) hours. 04/07/23   Artis Delay, MD  polyethylene glycol (MIRALAX / GLYCOLAX) 17 g packet Take 17 g by mouth 2 (two) times daily.     [provider]  senna (SENOKOT) 8.6 MG tablet Take 2 tablets by mouth 2 (two) times daily.    [provider]  sodium hypochlorite (DAKIN'S 1/4 STRENGTH) 0.125 % SOLN Use as directed for wound care 04/22/23         Allergies    Patient has no known allergies.    Review of Systems   Review of Systems  Cardiovascular:  Positive for palpitations.    Physical Exam Updated Vital Signs BP 102/68   Pulse (!) 119   Temp 98.3 F (36.8 C)   Resp (!) 30   Ht 5\' 6"  (1.676 m)   Wt 55 kg   SpO2 98%   BMI 19.57 kg/m  Physical Exam Vitals and nursing note reviewed.  Constitutional:      General: She is not in acute distress.    Appearance: She is well-developed. She is not diaphoretic.  HENT:     Head: Normocephalic and atraumatic.  Eyes:     Pupils: Pupils are equal, round, and reactive to light.  Cardiovascular:     Rate and Rhythm: Normal rate and regular rhythm.     Heart sounds: No murmur heard.    No friction rub. No gallop.  Pulmonary:     Effort: Pulmonary effort is normal.     Breath sounds: No wheezing or rales.  Chest:       Comments: Large area of skin breakdown to the right side of the chest extending to the back.  Multiple areas of granulation tissue.  No obvious purulent drainage. Abdominal:     General: There is no distension.     Palpations: Abdomen is soft.     Tenderness: There is no abdominal tenderness.  Musculoskeletal:        General: No tenderness.     Cervical back: Normal range of motion and neck supple.  Skin:    General: Skin is warm and dry.  Neurological:     Mental Status: She is alert and oriented to person, place, and time.  Psychiatric:        Behavior: Behavior normal.     ED Results / Procedures / Treatments   Labs (all labs ordered are listed, but only abnormal results are displayed) Labs Reviewed  CBC WITH DIFFERENTIAL/PLATELET - Abnormal; Notable for the following components:      Result Value   RBC 3.35 (*)     Hemoglobin 9.1 (*)    HCT 29.0 (*)    RDW 15.9 (*)    Platelets 544 (*)    All other components within normal limits  COMPREHENSIVE METABOLIC PANEL - Abnormal; Notable for the following components:   Glucose, Bld 141 (*)    Calcium 8.3 (*)    Albumin 2.7 (*)    AST 43 (*)    All other components within normal limits  HIV ANTIBODY (ROUTINE TESTING W REFLEX)  BASIC METABOLIC PANEL  CBC  MAGNESIUM    EKG None  Radiology DG Chest Port 1 View  Result Date: 05/19/2023 CLINICAL DATA:  Chest pain and palpitations EXAM: PORTABLE CHEST 1 VIEW COMPARISON:  Radiographs 08/22/2022 and CT chest 03/16/2023 FINDINGS: Stable cardiomediastinal silhouette. The right hilar fullness is likely at least in part due to the right chest wall lesions better seen on CT 03/16/2023. Right perihilar and lower lobe consolidation. Small right pleural effusion. The left lung is clear. No pneumothorax. IMPRESSION: 1. Right perihilar and lower lobe atelectasis or pneumonia with small right pleural effusion. 2. Right hilar fullness is in at least part due to the right chest wall lesions better seen on CT 03/16/2023. Electronically Signed   By: Minerva Fester M.D.   On: 05/19/2023 19:17    Procedures .Critical Care  Performed by: Melene Plan, DO Authorized by: Melene Plan, DO   Critical care provider statement:    Critical care time (minutes):  35   Critical care time was exclusive of:  Separately billable procedures and treating other patients   Critical care was time spent personally by me on the following activities:  Development of treatment plan with patient or surrogate, discussions with consultants, evaluation of patient's response to treatment, examination of patient, ordering and review of laboratory studies, ordering and review of radiographic studies, ordering and performing treatments and interventions, pulse oximetry, re-evaluation of patient's condition and review of old charts   Care discussed with:  admitting provider       Medications Ordered in ED Medications  morphine (MS CONTIN) 12 hr tablet 30 mg (has no administration in time range)  capecitabine (XELODA) tablet 1,000 mg (has no administration in time range)  senna (SENOKOT) tablet 17.2 mg (has no administration in time range)  polyethylene glycol (MIRALAX / GLYCOLAX) packet 17 g (has no administration in time range)  enoxaparin (LOVENOX) injection 40 mg (has no administration in time range)  sodium chloride  flush (NS) 0.9 % injection 3 mL (has no administration in time range)  acetaminophen (TYLENOL) tablet 650 mg (has no administration in time range)    Or  acetaminophen (TYLENOL) suppository 650 mg (has no administration in time range)  HYDROmorphone (DILAUDID) injection 1-2 mg (has no administration in time range)  bisacodyl (DULCOLAX) EC tablet 5 mg (has no administration in time range)  ondansetron (ZOFRAN) tablet 4 mg (has no administration in time range)    Or  ondansetron (ZOFRAN) injection 4 mg (has no administration in time range)  0.9 %  sodium chloride infusion (has no administration in time range)  HYDROmorphone (DILAUDID) injection 2 mg (2 mg Intravenous Given 05/19/23 1813)  ondansetron (ZOFRAN) injection 4 mg (4 mg Intravenous Given 05/19/23 1813)  sodium chloride 0.9 % bolus 1,000 mL (1,000 mLs Intravenous New Bag/Given 05/19/23 1812)  ketamine 50 mg in normal saline 5 mL (10 mg/mL) syringe (15 mg Intravenous Given 05/19/23 1905)    ED Course/ Medical Decision Making/ A&P                                 Medical Decision Making Amount and/or Complexity of Data Reviewed Labs: ordered. Radiology: ordered.  Risk Prescription drug management. Decision regarding hospitalization.   62 yo F with a chief complaints of needing acute pain control.  The patient unfortunately has been suffering from breast cancer and has significant skin breakdown to the right chest wall.  This has required escalating narcotics at  home and she is currently taking 8 mg Dilaudid tablets every 6 hours as well as she tells me she has a morphine infusion at home though in the documentation it looks like she takes MS Contin tablets twice a day 30 mg.  Will attempt to aggressively control her pain here.  Basic blood work reassess.  Basic blood work without significant change from her baseline.  No significant electrolyte abnormality no significant anemia.  Chest x-ray independently interpreted by me with possible right sided infiltrate.  Radiology read similar.  Discussed this with the patient to does not endorse any coughing no trouble breathing no fevers.  Will hold off on antibiotics at this time.  Discussed with medicine for admission.  The patients results and plan were reviewed and discussed.   Any x-rays performed were independently reviewed by myself.   Differential diagnosis were considered with the presenting HPI.  Medications  morphine (MS CONTIN) 12 hr tablet 30 mg (has no administration in time range)  capecitabine (XELODA) tablet 1,000 mg (has no administration in time range)  senna (SENOKOT) tablet 17.2 mg (has no administration in time range)  polyethylene glycol (MIRALAX / GLYCOLAX) packet 17 g (has no administration in time range)  enoxaparin (LOVENOX) injection 40 mg (has no administration in time range)  sodium chloride flush (NS) 0.9 % injection 3 mL (has no administration in time range)  acetaminophen (TYLENOL) tablet 650 mg (has no administration in time range)    Or  acetaminophen (TYLENOL) suppository 650 mg (has no administration in time range)  HYDROmorphone (DILAUDID) injection 1-2 mg (has no administration in time range)  bisacodyl (DULCOLAX) EC tablet 5 mg (has no administration in time range)  ondansetron (ZOFRAN) tablet 4 mg (has no administration in time range)    Or  ondansetron (ZOFRAN) injection 4 mg (has no administration in time range)  0.9 %  sodium chloride infusion (has no  administration in time range)  HYDROmorphone (DILAUDID) injection 2 mg (2 mg Intravenous Given 05/19/23 1813)  ondansetron (ZOFRAN) injection 4 mg (4 mg Intravenous Given 05/19/23 1813)  sodium chloride 0.9 % bolus 1,000 mL (1,000 mLs Intravenous New Bag/Given 05/19/23 1812)  ketamine 50 mg in normal saline 5 mL (10 mg/mL) syringe (15 mg Intravenous Given 05/19/23 1905)    Vitals:   05/19/23 1945 05/19/23 2000 05/19/23 2015 05/19/23 2030  BP: 114/68 110/65 113/65 102/68  Pulse: (!) 117 (!) 122 (!) 113 (!) 119  Resp: (!) 21 (!) 28 (!) 28 (!) 30  Temp:      SpO2: 100% 100% 100% 98%  Weight:      Height:        Final diagnoses:  Inadequate pain control  Cancer related pain    Admission/ observation were discussed with the admitting physician, patient and/or family and they are comfortable with the plan.          Final Clinical Impression(s) / ED Diagnoses Final diagnoses:  Inadequate pain control  Cancer related pain    Rx / DC Orders ED Discharge Orders     None         Melene Plan, DO 05/19/23 2107

## 2023-05-19 NOTE — H&P (Signed)
History and Physical    [name] ZOX:[id number] DOB: 08/30/1961 DOA: 05/19/2023  PCP: Artis Delay, MD   Patient coming from: Home   Chief Complaint: Painful right chest wounds   HPI: [name] is a 62 y.o. female with medical history significant for cancer of the right breast, back and right chest wounds, and chronic cancer related pain who presents to the emergency department with uncontrolled pain related to her right chest wounds.  Patient reports that she has been taking MS Contin and oral Dilaudid as prescribed but continues to experience severe pain related to her right chest wounds.  She does not feel that the wounds are improving, or worsening, with wound care.  She denies any fever or chills.  She has had difficulty managing her wounds and medications at home.  Her mother at the bedside is concerned that the patient has not been eating enough and continues to lose weight.  ED Course: Upon arrival to the ED, patient is found to be afebrile and saturating mid 90s on room air with elevated heart rate and stable blood pressure.  EKG demonstrates sinus tachycardia with rate of 139.  Chest x-ray is notable for right perihilar and lower lobe atelectasis versus pneumonia with small right pleural effusion.  Labs are most notable for albumin 2.7, hemoglobin 9.1, and platelets 544,000.  Patient was given a liter of normal saline, 15 mg ketamine, 2 mg IV Dilaudid, and Zofran in the ED.  Review of Systems:  All other systems reviewed and apart from HPI, are negative.  Past Medical History:  Diagnosis Date   Hypertension    rt breast ca 02/2022    Past Surgical History:  Procedure Laterality Date   ANKLE SURGERY     IR THORACENTESIS ASP PLEURAL SPACE W/IMG GUIDE  04/10/2022   IR US GUIDE BX ASP/DRAIN  03/24/2022    Social History:   reports that she has never smoked. She has never used smokeless tobacco. She reports that she does not drink alcohol and does not use drugs.  No  Known Allergies  Family History  Problem Relation Age of Onset   Hypertension Mother    Heart disease Father    Stroke Father    Kidney disease Father    Hypertension Sister    Hypertension Maternal Grandmother    Stroke Maternal Grandfather    Diabetes Paternal Grandmother    Cancer Cousin      Prior to Admission medications   Medication Sig Start Date End Date Taking? Authorizing Provider  calcium carbonate (TUMS - DOSED IN MG ELEMENTAL CALCIUM) 500 MG chewable tablet Chew 1 tablet by mouth 2 (two) times daily.    [provider]  capecitabine (XELODA) 500 MG tablet Take 2 tablets (1,000 mg total) by mouth 2 (two) times daily after a meal. Take within 30 minutes after meals. Take for 14 days on, 7 days off. Repeat every 21 days. 04/17/23   Artis Delay, MD  cholecalciferol (VITAMIN D3) 25 MCG (1000 UNIT) tablet Take 2,000 Units by mouth daily.    [provider]  HYDROmorphone (DILAUDID) 8 MG tablet Take 1 tablet (8 mg total) by mouth every 6 (six) hours as needed for moderate pain. 03/19/23   Artis Delay, MD  magnesium oxide (MAG-OX) 400 (240 Mg) MG tablet Take 1 tablet (400 mg total) by mouth daily. 06/12/22   Artis Delay, MD  morphine (MS CONTIN) 30 MG 12 hr tablet Take 1 tablet (30 mg total) by mouth every 12 (  twelve) hours. 04/07/23   Artis Delay, MD  polyethylene glycol (MIRALAX / GLYCOLAX) 17 g packet Take 17 g by mouth 2 (two) times daily.    [provider]  senna (SENOKOT) 8.6 MG tablet Take 2 tablets by mouth 2 (two) times daily.    [provider]  sodium hypochlorite (DAKIN'S 1/4 STRENGTH) 0.125 % SOLN Use as directed for wound care 04/22/23       Physical Exam: Vitals:   05/19/23 1750 05/19/23 1751 05/19/23 1753 05/19/23 1814  BP:   (!) 142/74 126/68  Pulse:  (!) 144  (!) 127  Resp:  (!) 25  (!) 32  Temp:  98.3 F (36.8 C)    SpO2:   99% 94%  Weight: 55 kg     Height: 5\' 6"  (1.676 m)       Constitutional: NAD, calm  Eyes: PERTLA,  lids and conjunctivae normal ENMT: Mucous membranes are moist. Posterior pharynx clear of any exudate or lesions.   Neck: supple, no masses  Respiratory: no wheezing, no crackles. No accessory muscle use.  Cardiovascular: S1 & S2 heard, regular rate and rhythm. No extremity edema.   Abdomen: No distension, no tenderness, soft. Bowel sounds active.  Musculoskeletal: no clubbing / cyanosis. No joint deformity upper and lower extremities.   Skin: Chest and back wounds with serosanguinous drainage and crust. Skin otherwise warm, dry, well-perfused. Neurologic: CN 2-12 grossly intact. Moving all extremities. Alert and oriented.  Psychiatric: Calm. Cooperative.    Labs and Imaging on Admission: I have personally reviewed following labs and imaging studies  CBC: Recent Labs  Lab 05/19/23 1810  WBC 5.8  NEUTROABS 4.4  HGB 9.1*  HCT 29.0*  MCV 86.6  PLT 544*   Basic Metabolic Panel: Recent Labs  Lab 05/19/23 1810  NA 135  K 3.7  CL 99  CO2 24  GLUCOSE 141*  BUN 20  CREATININE 0.97  CALCIUM 8.3*   GFR: Estimated Creatinine Clearance: 51.5 mL/min (by C-G formula based on SCr of 0.97 mg/dL). Liver Function Tests: Recent Labs  Lab 05/19/23 1810  AST 43*  ALT 12  ALKPHOS 73  BILITOT 0.6  PROT 6.6  ALBUMIN 2.7*   No results for input(s): "LIPASE", "AMYLASE" in the last 168 hours. No results for input(s): "AMMONIA" in the last 168 hours. Coagulation Profile: No results for input(s): "INR", "PROTIME" in the last 168 hours. Cardiac Enzymes: No results for input(s): "CKTOTAL", "CKMB", "CKMBINDEX", "TROPONINI" in the last 168 hours. BNP (last 3 results) No results for input(s): "PROBNP" in the last 8760 hours. HbA1C: No results for input(s): "HGBA1C" in the last 72 hours. CBG: No results for input(s): "GLUCAP" in the last 168 hours. Lipid Profile: No results for input(s): "CHOL", "HDL", "LDLCALC", "TRIG", "CHOLHDL", "LDLDIRECT" in the last 72 hours. Thyroid Function  Tests: No results for input(s): "TSH", "T4TOTAL", "FREET4", "T3FREE", "THYROIDAB" in the last 72 hours. Anemia Panel: No results for input(s): "VITAMINB12", "FOLATE", "FERRITIN", "TIBC", "IRON", "RETICCTPCT" in the last 72 hours. Urine analysis:    Component Value Date/Time   COLORURINE YELLOW 08/02/2022 1927   APPEARANCEUR CLEAR 08/02/2022 1927   LABSPEC 1.019 08/02/2022 1927   PHURINE 6.0 08/02/2022 1927   GLUCOSEU NEGATIVE 08/02/2022 1927   HGBUR NEGATIVE 08/02/2022 1927   BILIRUBINUR NEGATIVE 08/02/2022 1927   KETONESUR NEGATIVE 08/02/2022 1927   PROTEINUR NEGATIVE 08/02/2022 1927   NITRITE NEGATIVE 08/02/2022 1927   LEUKOCYTESUR NEGATIVE 08/02/2022 1927   Sepsis Labs: @LABRCNTIP (procalcitonin:4,lacticidven:4) )No results found for this or  any previous visit (from the past 240 hour(s)).   Radiological Exams on Admission: DG Chest Port 1 View  Result Date: 05/19/2023 CLINICAL DATA:  Chest pain and palpitations EXAM: PORTABLE CHEST 1 VIEW COMPARISON:  Radiographs 08/22/2022 and CT chest 03/16/2023 FINDINGS: Stable cardiomediastinal silhouette. The right hilar fullness is likely at least in part due to the right chest wall lesions better seen on CT 03/16/2023. Right perihilar and lower lobe consolidation. Small right pleural effusion. The left lung is clear. No pneumothorax. IMPRESSION: 1. Right perihilar and lower lobe atelectasis or pneumonia with small right pleural effusion. 2. Right hilar fullness is in at least part due to the right chest wall lesions better seen on CT 03/16/2023. Electronically Signed   By: Minerva Fester M.D.   On: 05/19/2023 19:17    EKG: Independently reviewed. Sinus tachycardia, rate 139.   Assessment/Plan   1. Intractable cancer-related pain  - Continue long-acting morphine, use IV Dilaudid for breakthrough pain for now, and transition back to oral medications as tolerated, continue bowel regimen    2. Breast cancer  - Currently on Xeloda under the  care of Dr. Bertis Ruddy    3. Sinus tachycardia  - HR 130-140, confirmed to be sinus, in setting of pain and hypovolemia  - Continue pain-control and IVF hydration   4. Wounds  - Do not appear acutely infected   - Continue wound care   5. Debility - Consult PT    DVT prophylaxis: Lovenox  Code Status: DNR  Level of Care: Level of care: Telemetry Family Communication: Mother at bedside   Disposition Plan:  Patient is from: Home  Anticipated d/c is to: SNF  Anticipated d/c date is: Possibly as early as 05/20/23  Patient currently: Pending pain-control, PT eval  Consults called: None  Admission status: Observation     Briscoe Deutscher, MD [facility]  05/19/2023, 7:41 PM

## 2023-05-19 NOTE — Telephone Encounter (Signed)
Called her regarding message from the after hours call early this am. She is going to the ER today to be evaluated. She is trying to get everything straight at her house before she goes. Offered to call her Mom, she declined.

## 2023-05-20 ENCOUNTER — Telehealth: Payer: Self-pay | Admitting: Nutrition

## 2023-05-20 ENCOUNTER — Observation Stay (HOSPITAL_COMMUNITY): Payer: BC Managed Care – PPO

## 2023-05-20 DIAGNOSIS — R000 Tachycardia, unspecified: Secondary | ICD-10-CM | POA: Diagnosis present

## 2023-05-20 DIAGNOSIS — C50911 Malignant neoplasm of unspecified site of right female breast: Secondary | ICD-10-CM | POA: Diagnosis not present

## 2023-05-20 DIAGNOSIS — E43 Unspecified severe protein-calorie malnutrition: Secondary | ICD-10-CM | POA: Diagnosis present

## 2023-05-20 DIAGNOSIS — T148XXD Other injury of unspecified body region, subsequent encounter: Secondary | ICD-10-CM | POA: Diagnosis not present

## 2023-05-20 DIAGNOSIS — C799 Secondary malignant neoplasm of unspecified site: Secondary | ICD-10-CM | POA: Diagnosis present

## 2023-05-20 DIAGNOSIS — R64 Cachexia: Secondary | ICD-10-CM | POA: Diagnosis present

## 2023-05-20 DIAGNOSIS — D630 Anemia in neoplastic disease: Secondary | ICD-10-CM | POA: Diagnosis present

## 2023-05-20 DIAGNOSIS — Z681 Body mass index (BMI) 19 or less, adult: Secondary | ICD-10-CM | POA: Diagnosis not present

## 2023-05-20 DIAGNOSIS — R609 Edema, unspecified: Secondary | ICD-10-CM | POA: Diagnosis not present

## 2023-05-20 DIAGNOSIS — Z7189 Other specified counseling: Secondary | ICD-10-CM | POA: Diagnosis not present

## 2023-05-20 DIAGNOSIS — I10 Essential (primary) hypertension: Secondary | ICD-10-CM | POA: Diagnosis present

## 2023-05-20 DIAGNOSIS — Z79899 Other long term (current) drug therapy: Secondary | ICD-10-CM | POA: Diagnosis not present

## 2023-05-20 DIAGNOSIS — S279XXA Injury of unspecified intrathoracic organ, initial encounter: Secondary | ICD-10-CM | POA: Diagnosis present

## 2023-05-20 DIAGNOSIS — B9561 Methicillin susceptible Staphylococcus aureus infection as the cause of diseases classified elsewhere: Secondary | ICD-10-CM | POA: Diagnosis present

## 2023-05-20 DIAGNOSIS — Z66 Do not resuscitate: Secondary | ICD-10-CM | POA: Diagnosis present

## 2023-05-20 DIAGNOSIS — J9811 Atelectasis: Secondary | ICD-10-CM | POA: Diagnosis present

## 2023-05-20 DIAGNOSIS — E860 Dehydration: Secondary | ICD-10-CM | POA: Diagnosis present

## 2023-05-20 DIAGNOSIS — X58XXXA Exposure to other specified factors, initial encounter: Secondary | ICD-10-CM | POA: Diagnosis present

## 2023-05-20 DIAGNOSIS — C50811 Malignant neoplasm of overlapping sites of right female breast: Secondary | ICD-10-CM | POA: Diagnosis present

## 2023-05-20 DIAGNOSIS — M7989 Other specified soft tissue disorders: Secondary | ICD-10-CM | POA: Diagnosis not present

## 2023-05-20 DIAGNOSIS — Z515 Encounter for palliative care: Secondary | ICD-10-CM | POA: Diagnosis not present

## 2023-05-20 DIAGNOSIS — Z8249 Family history of ischemic heart disease and other diseases of the circulatory system: Secondary | ICD-10-CM | POA: Diagnosis not present

## 2023-05-20 DIAGNOSIS — G893 Neoplasm related pain (acute) (chronic): Secondary | ICD-10-CM | POA: Diagnosis present

## 2023-05-20 DIAGNOSIS — E876 Hypokalemia: Secondary | ICD-10-CM | POA: Diagnosis present

## 2023-05-20 DIAGNOSIS — S21101A Unspecified open wound of right front wall of thorax without penetration into thoracic cavity, initial encounter: Secondary | ICD-10-CM | POA: Diagnosis present

## 2023-05-20 DIAGNOSIS — J189 Pneumonia, unspecified organism: Secondary | ICD-10-CM | POA: Diagnosis present

## 2023-05-20 DIAGNOSIS — R52 Pain, unspecified: Secondary | ICD-10-CM | POA: Diagnosis not present

## 2023-05-20 DIAGNOSIS — E861 Hypovolemia: Secondary | ICD-10-CM | POA: Diagnosis present

## 2023-05-20 DIAGNOSIS — D75839 Thrombocytosis, unspecified: Secondary | ICD-10-CM | POA: Diagnosis present

## 2023-05-20 DIAGNOSIS — R54 Age-related physical debility: Secondary | ICD-10-CM | POA: Diagnosis present

## 2023-05-20 DIAGNOSIS — J90 Pleural effusion, not elsewhere classified: Secondary | ICD-10-CM | POA: Diagnosis present

## 2023-05-20 LAB — HIV ANTIBODY (ROUTINE TESTING W REFLEX): HIV Screen 4th Generation wRfx: NONREACTIVE

## 2023-05-20 LAB — CBC
HCT: 23.9 % — ABNORMAL LOW (ref 36.0–46.0)
Hemoglobin: 7.5 g/dL — ABNORMAL LOW (ref 12.0–15.0)
MCH: 27.1 pg (ref 26.0–34.0)
MCHC: 31.4 g/dL (ref 30.0–36.0)
MCV: 86.3 fL (ref 80.0–100.0)
Platelets: 402 10*3/uL — ABNORMAL HIGH (ref 150–400)
RBC: 2.77 MIL/uL — ABNORMAL LOW (ref 3.87–5.11)
RDW: 16 % — ABNORMAL HIGH (ref 11.5–15.5)
WBC: 4.6 10*3/uL (ref 4.0–10.5)
nRBC: 0 % (ref 0.0–0.2)

## 2023-05-20 LAB — BASIC METABOLIC PANEL
Anion gap: 8 (ref 5–15)
BUN: 17 mg/dL (ref 8–23)
CO2: 25 mmol/L (ref 22–32)
Calcium: 7.4 mg/dL — ABNORMAL LOW (ref 8.9–10.3)
Chloride: 101 mmol/L (ref 98–111)
Creatinine, Ser: 0.86 mg/dL (ref 0.44–1.00)
GFR, Estimated: >60 mL/min (ref >60)
Glucose, Bld: 135 mg/dL — ABNORMAL HIGH (ref 70–99)
Potassium: 3.4 mmol/L — ABNORMAL LOW (ref 3.5–5.1)
Sodium: 134 mmol/L — ABNORMAL LOW (ref 135–145)

## 2023-05-20 LAB — C-REACTIVE PROTEIN: CRP: 5.1 mg/dL — ABNORMAL HIGH (ref <1.0)

## 2023-05-20 LAB — MAGNESIUM: Magnesium: 2.2 mg/dL (ref 1.7–2.4)

## 2023-05-20 LAB — PREALBUMIN: Prealbumin: 6 mg/dL — ABNORMAL LOW (ref 18–38)

## 2023-05-20 MED ORDER — METRONIDAZOLE 500 MG/100ML IV SOLN
500.0000 mg | Freq: Two times a day (BID) | INTRAVENOUS | Status: AC
  Administered 2023-05-20 – 2023-05-26 (×14): 500 mg via INTRAVENOUS
  Filled 2023-05-20 (×14): qty 100

## 2023-05-20 MED ORDER — BOOST / RESOURCE BREEZE PO LIQD CUSTOM
1.0000 | Freq: Two times a day (BID) | ORAL | Status: DC
  Administered 2023-05-21 – 2023-05-27 (×8): 1 via ORAL

## 2023-05-20 MED ORDER — VANCOMYCIN HCL IN DEXTROSE 1-5 GM/200ML-% IV SOLN
1000.0000 mg | INTRAVENOUS | Status: DC
  Administered 2023-05-21: 1000 mg via INTRAVENOUS
  Filled 2023-05-20 (×2): qty 200

## 2023-05-20 MED ORDER — HYDROMORPHONE HCL 1 MG/ML IJ SOLN
1.0000 mg | INTRAMUSCULAR | Status: DC | PRN
  Administered 2023-05-20: 2 mg via INTRAVENOUS
  Administered 2023-05-20: 1 mg via INTRAVENOUS
  Administered 2023-05-21 (×2): 2 mg via INTRAVENOUS
  Administered 2023-05-22: 1 mg via INTRAVENOUS
  Administered 2023-05-22: 2 mg via INTRAVENOUS
  Filled 2023-05-20 (×2): qty 2
  Filled 2023-05-20: qty 1
  Filled 2023-05-20 (×3): qty 2

## 2023-05-20 MED ORDER — SODIUM CHLORIDE 0.9 % IV SOLN: INTRAVENOUS | Status: AC

## 2023-05-20 MED ORDER — ENSURE ENLIVE PO LIQD
237.0000 mL | Freq: Two times a day (BID) | ORAL | Status: DC
  Administered 2023-05-20 – 2023-05-28 (×14): 237 mL via ORAL

## 2023-05-20 MED ORDER — ENSURE ENLIVE PO LIQD: 237.0000 mL | Freq: Two times a day (BID) | ORAL | Status: DC

## 2023-05-20 MED ORDER — METRONIDAZOLE 0.75 % EX GEL
Freq: Every day | CUTANEOUS | Status: AC
  Filled 2023-05-20 (×4): qty 45

## 2023-05-20 MED ORDER — VANCOMYCIN HCL IN DEXTROSE 1-5 GM/200ML-% IV SOLN: 1000.0000 mg | INTRAVENOUS | Status: DC

## 2023-05-20 MED ORDER — SODIUM CHLORIDE 0.9 % IV BOLUS
500.0000 mL | Freq: Once | INTRAVENOUS | Status: AC
  Administered 2023-05-20: 500 mL via INTRAVENOUS

## 2023-05-20 MED ORDER — BOOST / RESOURCE BREEZE PO LIQD CUSTOM
1.0000 | Freq: Three times a day (TID) | ORAL | Status: DC
  Administered 2023-05-20 (×2): 1 via ORAL

## 2023-05-20 MED ORDER — VANCOMYCIN HCL 1250 MG/250ML IV SOLN
1250.0000 mg | Freq: Once | INTRAVENOUS | Status: AC
  Administered 2023-05-20: 1250 mg via INTRAVENOUS
  Filled 2023-05-20: qty 250

## 2023-05-20 MED ORDER — SODIUM CHLORIDE 0.9 % IV SOLN
2.0000 g | INTRAVENOUS | Status: DC
  Administered 2023-05-20 – 2023-05-22 (×3): 2 g via INTRAVENOUS
  Filled 2023-05-20 (×3): qty 20

## 2023-05-20 MED ORDER — JUVEN PO PACK
1.0000 | PACK | Freq: Two times a day (BID) | ORAL | Status: DC
  Administered 2023-05-20 – 2023-05-25 (×8): 1 via ORAL
  Filled 2023-05-20 (×8): qty 1

## 2023-05-20 MED ORDER — DIPHENHYDRAMINE HCL 25 MG PO CAPS
25.0000 mg | ORAL_CAPSULE | Freq: Four times a day (QID) | ORAL | Status: DC | PRN
  Administered 2023-05-20: 25 mg via ORAL
  Filled 2023-05-20: qty 1

## 2023-05-20 MED ORDER — MELATONIN 5 MG PO TABS
5.0000 mg | ORAL_TABLET | Freq: Once | ORAL | Status: AC
  Administered 2023-05-20: 5 mg via ORAL
  Filled 2023-05-20: qty 1

## 2023-05-20 MED ORDER — SULFAMETHOXAZOLE-TRIMETHOPRIM 400-80 MG PO TABS
1.0000 | ORAL_TABLET | Freq: Two times a day (BID) | ORAL | Status: DC
Start: 2023-05-20 — End: 2023-05-20

## 2023-05-20 NOTE — Plan of Care (Signed)
  Problem: Clinical Measurements: Goal: Ability to maintain clinical measurements within normal limits will improve Outcome: Progressing   Problem: Coping: Goal: Level of anxiety will decrease Outcome: Progressing   Problem: Pain Managment: Goal: General experience of comfort will improve Outcome: Progressing   Problem: Education: Goal: Knowledge of General Education information will improve Description: Including pain rating scale, medication(s)/side effects and non-pharmacologic comfort measures Outcome: Not Progressing   Problem: Health Behavior/Discharge Planning: Goal: Ability to manage health-related needs will improve Outcome: Not Progressing   Problem: Activity: Goal: Risk for activity intolerance will decrease Outcome: Not Progressing

## 2023-05-20 NOTE — TOC Initial Note (Signed)
Transition of Care [facility]) - Initial/Assessment Note    Patient Details  Name: [name] MRN: [id number] Date of Birth: 02-Aug-1961  Transition of Care [facility]) CM/SW Contact:    Larrie Kass, LCSW Phone Number: 05/20/2023, 1:08 PM  Clinical Narrative:                 PT eval pending. TOC to follow.  Expected Discharge Plan:  (TBD) Barriers to Discharge: Continued Medical Work up   Patient Goals and CMS Choice            Expected Discharge Plan and Services                                              Prior Living Arrangements/Services                       Activities of Daily Living Home Assistive Devices/Equipment: None ADL Screening (condition at time of admission) Patient's cognitive ability adequate to safely complete daily activities?: Yes Is the patient deaf or have difficulty hearing?: No Does the patient have difficulty seeing, even when wearing glasses/contacts?: No Does the patient have difficulty concentrating, remembering, or making decisions?: No Patient able to express need for assistance with ADLs?: Yes Does the patient have difficulty dressing or bathing?: No Independently performs ADLs?: Yes (appropriate for developmental age) Does the patient have difficulty walking or climbing stairs?: No Weakness of Legs: None Weakness of Arms/Hands: None  Permission Sought/Granted                  Emotional Assessment              Admission diagnosis:  Intractable pain [R52] Patient Active Problem List   Diagnosis Date Noted   Intractable pain 05/19/2023   Non compliance with medical treatment 05/08/2023   Anemia due to antineoplastic chemotherapy 04/07/2023   Weight loss, non-intentional 01/22/2023   MSSA (methicillin susceptible Staphylococcus aureus) infection 08/12/2022   Empyema, right (HCC) 08/11/2022   Need for management of chest tube 08/11/2022   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   Need for  emotional support 08/05/2022   Constipation 08/05/2022   Palliative care encounter 08/05/2022   Protein-calorie malnutrition, severe 08/05/2022   Pleural effusion, right 08/04/2022   Pleural effusion on right 08/02/2022   Protein calorie malnutrition (HCC) 08/02/2022   Goals of care, counseling/discussion 07/25/2022   Vitamin D deficiency 06/12/2022   Situational depression 05/30/2022   GERD (gastroesophageal reflux disease) 05/30/2022   Pancytopenia, acquired (HCC) 05/08/2022   Wound healing, delayed 05/08/2022   Physical deconditioning 04/17/2022   Poor social situation 04/02/2022   Cancer associated pain 03/31/2022   Poor venous access 03/31/2022   Other constipation 03/31/2022   Breast cancer metastasized to bone, right (HCC) 03/23/2022   Breast cancer, stage 4, right (HCC) 03/22/2022   Pleural effusion 03/22/2022   Cellulitis 03/22/2022   Microcytic anemia 03/22/2022   Thrombocytosis 03/22/2022   Lactic acidosis 03/22/2022   Essential hypertension 01/27/2018   Screening for diabetes mellitus 01/27/2018   Screening for thyroid disorder 01/27/2018   PCP:  Artis Delay, MD Pharmacy:   [facility] [address] [place] [address] Phone: [phone] Fax: [phone]  [facility] [place] [place] [address] [place] [address] Phone:  [phone] Fax: [phone]     Social Determinants of Health (SDOH) Social History: SDOH Screenings   Food Insecurity: No Food Insecurity (05/20/2023)  Housing: Low Risk  (05/20/2023)  Transportation Needs: No Transportation Needs (05/20/2023)  Utilities: Not At Risk (05/20/2023)  Financial Resource Strain: Low Risk  (04/03/2022)  Tobacco Use: Low Risk  (05/19/2023)   SDOH Interventions:     Readmission Risk Interventions    08/30/2022    1:46 PM 08/06/2022   10:01 AM  Readmission Risk Prevention Plan  Transportation  Screening Complete   PCP or Specialist Appt within 3-5 Days Complete   HRI or Home Care Consult Complete   Social Work Consult for Recovery Care Planning/Counseling Complete   Palliative Care Screening Not Applicable   Medication Review Oceanographer) Complete Complete  PCP or Specialist appointment within 3-5 days of discharge  Complete  HRI or Home Care Consult  Complete  SW Recovery Care/Counseling Consult  Complete  Palliative Care Screening  Not Applicable  Skilled Nursing Facility  Complete

## 2023-05-20 NOTE — Telephone Encounter (Signed)
Called patient twice; unable to leave a message in regards to scheduled appointment times/dates due to patient's voicemail box being full

## 2023-05-20 NOTE — Consult Note (Signed)
WOC Nurse Consult Note: Reason for Consult: chest wound Patient known to [facility] nursing team. Seen last inpatient in Nov. 2023. She is followed by the [facility] and was last seen there 05/04/23. Patient did have HHRN which has been DC because of the patient's refusal for visits. Noted in oncology notes patient can not change dressing herself and does not wish to involve her family in her care. Seeking SNF placement and pain management. Therefore I will add palliative wound care. WCC was using Dakin's solution, however this will dry out and is a chemical debridement agent. Instead will use gentle non adherent with the option to add topical for odor control if needed. Last WCC visit no odor was not noted, however nursing flow sheets indicate odor  Wound type: neoplastic Pressure Injury POA: NA Measurement:large area involving the right breast, side/ neck Wound OZD:[id number], weeping Drainage (amount, consistency, odor) minimal,  Periwound:intact  Dressing procedure/placement/frequency: metronidazole gel applied to xeroform gauze and placed over lesion once daily. This is to be topped with dry gauze and ABD pads and secured. May use mesh briefs with the center cut to form a "tube top" or circular elastic dressing to secure or use roll gauze to encircle chest and secure with paper tape per patient preference.    Re consult if needed, will not follow at this time. Thanks  Clarise Chacko [employer], RN,CWOCN, CNS, CWON-AP [phone])

## 2023-05-20 NOTE — Progress Notes (Signed)
Initial Nutrition Assessment  DOCUMENTATION CODES:   Severe malnutrition in context of chronic illness  INTERVENTION:  Ensure Plus High Protein po BID, each supplement provides 350 kcal and 20 grams of protein.  Boost Breeze po BID, each supplement provides 250 kcal and 9 grams of protein  1 packet Juven BID, each packet provides 95 calories, 2.5 grams of protein (collagen), and 9.8 grams of carbohydrate (3 grams sugar); also contains 7 grams of L-arginine and L-glutamine, 300 mg vitamin C, 15 mg vitamin E, 1.2 mcg vitamin B-12, 9.5 mg zinc, 200 mg calcium, and 1.5 g  Calcium Beta-hydroxy-Beta-methylbutyrate to support wound healing  Encourage po intake   NUTRITION DIAGNOSIS:   Severe Malnutrition related to chronic illness, cancer and cancer related treatments as evidenced by percent weight loss, severe muscle depletion, severe fat depletion.   GOAL:   Patient will meet greater than or equal to 90% of their needs   MONITOR:   Weight trends, Supplement acceptance, PO intake, Skin, I & O's, Labs  REASON FOR ASSESSMENT:   Consult Wound healing  ASSESSMENT:   62 y.o. female with PMHx including breast cancer, back and R chest wounds, chronic pain 2/2 cancer who presents with uncontrolled pain related to R chest wounds  Per MD note:  -Family concerned for poor po intake and patient continues to lose weight  -Wounds not improving    Patient familiar to nutrition services and this RD   Visited patient at bedside who reports poor appetite and that is has not been good for a some times due to her feeling so poorly and not being strong enough to ambulate to her kitchen at home due to having to climb up and down stairs.   She reports eating crackers, soup, oatmeal, fruit, and drinking boost breeze and water today. RD discussed with patient her need for protein. RD shared with her that protein is crucial for strength, wound healing and recovery.   She denies N/V/D/C, trouble  chewing/swallowing at this time    Labs: Na 134, Glu 135 Meds: rocephin, flagyl, boost breeze TID, miralax, senokot, NS, vancocin, vancoready   Continuous meds: NS @100  ml/hr   Wt: 6 kg (9.8%) wt loss x 1 month  05/19/23 55 kg  05/08/23 57.8 kg  04/17/23 61.1 kg  04/07/23 61.2 kg    PO: 50% x 1 documented meals   I/O's: +1.3 L since admission, no UOP   NUTRITION - FOCUSED PHYSICAL EXAM:  Flowsheet Row Most Recent Value  Orbital Region Severe depletion  Upper Arm Region Severe depletion  Thoracic and Lumbar Region Severe depletion  Buccal Region Severe depletion  Temple Region Severe depletion  Clavicle Bone Region Severe depletion  Clavicle and Acromion Bone Region Severe depletion  Scapular Bone Region Unable to assess  Dorsal Hand Severe depletion  Patellar Region Unable to assess  Anterior Thigh Region Unable to assess  Posterior Calf Region Unable to assess  Edema (RD Assessment) None  Hair Reviewed  Eyes Reviewed  Mouth Reviewed  Skin Reviewed  Nails Reviewed       Diet Order:   Diet Order             Diet regular Room service appropriate? Yes; Fluid consistency: Thin  Diet effective now                   EDUCATION NEEDS:   Education needs have been addressed  Skin:  Skin Assessment: Skin Integrity Issues: Skin Integrity Issues:: Other (Comment) Other: R  breast nonhealing wound  Last BM:  unknown  Height:   Ht Readings from Last 1 Encounters:  05/19/23 5\' 6"  (1.676 m)    Weight:   Wt Readings from Last 1 Encounters:  05/19/23 55 kg    Ideal Body Weight:     BMI:  Body mass index is 19.57 kg/m.  Estimated Nutritional Needs:   Kcal:  1093-2355  Protein:  65-85 g  Fluid:  > 1.7L    Leodis Rains, RDN, LDN  Clinical Nutrition

## 2023-05-20 NOTE — Progress Notes (Signed)
[name]   DOB:[birth year]   WJ#:[id number]    ASSESSMENT & PLAN:  Breast cancer, stage 4, right (HCC) Her chest wall mass is worse I do not believe she is responding to Xeloda even though we just started her on low-dose recently  However, given her chest wall infection, I will hold her Xeloda while hospitalized  wound healing, delayed She was seen by wound care center who recommended advanced home care for follow-up The patient is not capable of performing dressing changes herself Unfortunately, the patient has told advanced home care nurse not to return At this point in time, after long conversation, she is willing to consider being admitted to skilled nursing facility for assistance On exam today, her chest wall is malodorous with significant purulent drainage I believe this is infected in comparison with my prior outpatient exam I recommend swab for wound culture and start her on empiric antibiotics Wound care nurse has been consulted   Cancer associated pain We have extensive discussion about pain control The patient repeatedly stated that her pain is not well-controlled I have counted her pills multiple times in the outpatient clinic and felt that the patient is not taking her medication as directed I will consult palliative care team to assist in pain management  Weight loss, non-intentional She has progressive weight loss Previously I have prescribed Remeron but the patient has not been taking it She has not been eating well and not been taking her nutritional supplement Will get dietitian to see   Non compliance with medical treatment She has been noncompliant with medications and prescribed treatment She is not capable of following instruction Her mother tried to help with pill organization but the patient has declined this repeatedly for her mother to get involved She has declined home physical therapy She have declined for home occupational therapy She has not been  taking her medications as prescribed and overall, I do not feel that the patient is capable of taking care of herself We will consult social worker to look for skilled nursing facility for admission; unfortunately, outpatient social worker has exhausted all options The patient need to be placed in skilled nursing facility upon discharge   Goals of care, counseling/discussion I have numerous goals of care discussion with the patient and family in the past The patient is declining due to inability to take care of herself and inability to take medications and treatment as prescribed I felt that she is on rapid decline and I do not believe she is going to survive this cancer without additional help The patient does not want to stop treatment  Discharge planning Unknown  All questions were answered. The patient knows to call the clinic with any problems, questions or concerns.   The total time spent in the appointment was 60 minutes encounter with patients including review of chart and various tests results, discussions about plan of care and coordination of care plan  Artis Delay, MD 05/20/2023 8:38 AM  Subjective:  Patient is well-known to me.  She is being admitted for pain management.  She also have mild orders discharge from her chest wall requiring antibiotics. She has not been doing any dressing changes at home She is mildly constipated, last bowel movement 2 days ago  Objective:  Vitals:   05/20/23 0359 05/20/23 0710  BP: 112/72   Pulse: (!) 109   Resp: 16 17  Temp: 98.2 F (36.8 C)   SpO2: 97%      Intake/Output Summary (Last 24  hours) at 05/20/2023 0838 Last data filed at 05/20/2023 6010 Gross per 24 hour  Intake 1263.52 ml  Output 0 ml  Net 1263.52 ml    GENERAL:alert, no distress and comfortable SKIN: Her chest wall is examined.  She has nice wound healing near the right clavicle but progression of disease near her sternum.  There is a significant malodorous discharge  coming from her chest wall wound that is worse in comparison with my prior exam She looks thin and cachectic NEURO: alert & oriented x 3 with fluent speech, no focal motor/sensory deficits   Labs:  Recent Labs    04/17/23 1338 05/08/23 1510 05/19/23 1810  NA 140 138 135  K 3.8 4.3 3.7  CL 104 102 99  CO2 30 29 24   GLUCOSE 106* 128* 141*  BUN 12 19 20   CREATININE 0.89 1.06* 0.97  CALCIUM 9.2 8.6* 8.3*  GFRNONAA >60 59* >60  PROT 6.9 6.4* 6.6  ALBUMIN 3.5 3.2* 2.7*  AST 41 33 43*  ALT 9 6 12   ALKPHOS 57 64 73  BILITOT 0.5 0.3 0.6    Studies:  DG Chest Port 1 View  Result Date: 05/19/2023 CLINICAL DATA:  Chest pain and palpitations EXAM: PORTABLE CHEST 1 VIEW COMPARISON:  Radiographs 08/22/2022 and CT chest 03/16/2023 FINDINGS: Stable cardiomediastinal silhouette. The right hilar fullness is likely at least in part due to the right chest wall lesions better seen on CT 03/16/2023. Right perihilar and lower lobe consolidation. Small right pleural effusion. The left lung is clear. No pneumothorax. IMPRESSION: 1. Right perihilar and lower lobe atelectasis or pneumonia with small right pleural effusion. 2. Right hilar fullness is in at least part due to the right chest wall lesions better seen on CT 03/16/2023. Electronically Signed   By: Minerva Fester M.D.   On: 05/19/2023 19:17

## 2023-05-20 NOTE — Plan of Care (Signed)
  Problem: Clinical Measurements: Goal: Respiratory complications will improve Outcome: Progressing   Problem: Activity: Goal: Risk for activity intolerance will decrease Outcome: Progressing   Problem: Nutrition: Goal: Adequate nutrition will be maintained Outcome: Progressing   Problem: Coping: Goal: Level of anxiety will decrease Outcome: Progressing   Problem: Elimination: Goal: Will not experience complications related to bowel motility Outcome: Progressing Goal: Will not experience complications related to urinary retention Outcome: Progressing   Problem: Pain Managment: Goal: General experience of comfort will improve Outcome: Progressing   Problem: Safety: Goal: Ability to remain free from injury will improve Outcome: Progressing

## 2023-05-20 NOTE — Progress Notes (Signed)
PT Cancellation Note  Patient Details Name: [name] MRN: [id number] DOB: 06/16/1961   Cancelled Treatment:    Reason Eval/Treat Not Completed: Patient declined, reports awaiting wound care to dress R trunk wounds. Preliminary findings for B LE DVT negative. PT to return later in the day as schedule allows and continue to follow acutely.   [name], PT Acute Rehab  [name] 05/20/2023, 12:18 PM

## 2023-05-20 NOTE — Progress Notes (Signed)
Pharmacy Antibiotic Note  [name] is a 62 y.o. female admitted on 05/19/2023 with uncontrolled pain related to her right chest wounds.  Pharmacy has been consulted for Vancomycin dosing for chest wound infection.  PMH: cancer of the right breast, chest wounds, and chronic cancer related pain   Plan: - Vancomycin 1.25gm IV now x 1 dose, then 1gm IV every 24 hours - estimated AUC: 535 - Measure Vancomycin levels as needed. Goal AUC: 400-550 - Follow up renal function, culture results, and clinical course.  Height: 5\' 6"  (167.6 cm) Weight: 55 kg (121 lb 4.1 oz) IBW/kg (Calculated) : 59.3  Temp (24hrs), Avg:98.4 F (36.9 C), Min:98.2 F (36.8 C), Max:98.5 F (36.9 C)  Recent Labs  Lab 05/19/23 1810  WBC 5.8  CREATININE 0.97    Estimated Creatinine Clearance: 51.5 mL/min (by C-G formula based on SCr of 0.97 mg/dL).    No Known Allergies  Antimicrobials this admission: 8/21 Ceftriaxone >>  8/21 Vancomycin >>   Microbiology results: 8/21 Wound from breast: sent  Thank you for allowing pharmacy to be a part of this patient's care.  Abraham Entwistle [name] 05/20/2023 9:48 AM

## 2023-05-20 NOTE — Plan of Care (Signed)
  Problem: Pain Managment: Goal: General experience of comfort will improve Outcome: Progressing   Problem: Education: Goal: Knowledge of General Education information will improve Description: Including pain rating scale, medication(s)/side effects and non-pharmacologic comfort measures Outcome: Not Progressing   Problem: Health Behavior/Discharge Planning: Goal: Ability to manage health-related needs will improve Outcome: Not Progressing   Problem: Clinical Measurements: Goal: Ability to maintain clinical measurements within normal limits will improve Outcome: Not Progressing   Problem: Skin Integrity: Goal: Risk for impaired skin integrity will decrease Outcome: Not Progressing

## 2023-05-20 NOTE — Progress Notes (Signed)
PROGRESS NOTE    [name]  ZOX:[id number] DOB: 1961/07/13 DOA: 05/19/2023 PCP: Artis Delay, MD   Brief Narrative: 62 year old with past medical history significant for right breast cancer, with chronic right chest and back wounds, chronic cancer-related pain presents with uncontrolled pain at the site of her chronic wounds.  She has been taking MS Contin and oral Dilaudid at home as prescribed.  She has been noticed to have worsening drainage from her wounds.  She was also noted to be tachycardic with heart rates in the 140s.  Chest x-ray perihilar and lower lobe atelectasis versus pneumonia with a small right pleural effusion.   Assessment & Plan:   Principal Problem:   Intractable pain Active Problems:   Breast cancer, stage 4, right (HCC)   Thrombocytosis   Wound healing, delayed   1-Intractable cancer-related pain, chest;  Continue with MS Contin, changed frequency of IV Dilaudid to every 3 hours.  Palliative care has been consulted.  2-Chronic chest wall wound, with active infection: Patient having worsening drainage from wound and foul-smelling. -Start IV vancomycin, ceftriaxone and Flagyl -Wound care consulted  Breast cancer: On Xeloda under Dr. Bertis Ruddy care.    Tachycardia; suspect related to hypovolemia and pain.  Denies pleuritic chest pain.  Will proceed with Dopplers of lower extremity.  If tachycardia persists may need CT angio chest.  Debility: In the setting of malignancy PT consulted Hypokalemia: replete orally.   Anemia; of malignancy: monitor.   Estimated body mass index is 19.57 kg/m as calculated from the following:   Height as of this encounter: 5\' 6"  (1.676 m).   Weight as of this encounter: 55 kg.   DVT prophylaxis: Lovenox Code Status: DNR Family Communication: Care discussed with patient.  Disposition Plan:  Status is: Observation The patient will require care spanning > 2 midnights and should be moved to inpatient because: management of  chest wound    Consultants:  Oncology   Procedures:   Antimicrobials:    Subjective: She report chest wound pain, denies pleuritic chest pain.    Objective: Vitals:   05/19/23 2124 05/19/23 2324 05/20/23 0359 05/20/23 0710  BP: 118/76 132/88 112/72   Pulse: (!) 119 (!) 116 (!) 109   Resp: 18 20 16 17   Temp: 98.4 F (36.9 C) 98.5 F (36.9 C) 98.2 F (36.8 C)   TempSrc: Oral Oral Oral   SpO2: 99% 98% 97%   Weight:      Height:        Intake/Output Summary (Last 24 hours) at 05/20/2023 0903 Last data filed at 05/20/2023 8119 Gross per 24 hour  Intake 1263.52 ml  Output 0 ml  Net 1263.52 ml   Filed Weights   05/19/23 1750  Weight: 55 kg    Examination:  General exam: Appears calm and comfortable  Respiratory system: Clear to auscultation. Respiratory effort normal. Cardiovascular system: S1 & S2 heard, RRR.  Chest wall with large open wound cover all anterior right side chest, purulent  drainage  Gastrointestinal system: Abdomen is nondistended, soft and nontender.  Central nervous system: Alert and oriented. Extremities: Symmetric 5 x 5 power.    Data Reviewed: I have personally reviewed following labs and imaging studies  CBC: Recent Labs  Lab 05/19/23 1810  WBC 5.8  NEUTROABS 4.4  HGB 9.1*  HCT 29.0*  MCV 86.6  PLT 544*   Basic Metabolic Panel: Recent Labs  Lab 05/19/23 1810  NA 135  K 3.7  CL 99  CO2 24  GLUCOSE  141*  BUN 20  CREATININE 0.97  CALCIUM 8.3*   GFR: Estimated Creatinine Clearance: 51.5 mL/min (by C-G formula based on SCr of 0.97 mg/dL). Liver Function Tests: Recent Labs  Lab 05/19/23 1810  AST 43*  ALT 12  ALKPHOS 73  BILITOT 0.6  PROT 6.6  ALBUMIN 2.7*   No results for input(s): "LIPASE", "AMYLASE" in the last 168 hours. No results for input(s): "AMMONIA" in the last 168 hours. Coagulation Profile: No results for input(s): "INR", "PROTIME" in the last 168 hours. Cardiac Enzymes: No results for input(s):  "CKTOTAL", "CKMB", "CKMBINDEX", "TROPONINI" in the last 168 hours. BNP (last 3 results) No results for input(s): "PROBNP" in the last 8760 hours. HbA1C: No results for input(s): "HGBA1C" in the last 72 hours. CBG: No results for input(s): "GLUCAP" in the last 168 hours. Lipid Profile: No results for input(s): "CHOL", "HDL", "LDLCALC", "TRIG", "CHOLHDL", "LDLDIRECT" in the last 72 hours. Thyroid Function Tests: No results for input(s): "TSH", "T4TOTAL", "FREET4", "T3FREE", "THYROIDAB" in the last 72 hours. Anemia Panel: No results for input(s): "VITAMINB12", "FOLATE", "FERRITIN", "TIBC", "IRON", "RETICCTPCT" in the last 72 hours. Sepsis Labs: No results for input(s): "PROCALCITON", "LATICACIDVEN" in the last 168 hours.  No results found for this or any previous visit (from the past 240 hour(s)).       Radiology Studies: DG Chest Port 1 View  Result Date: 05/19/2023 CLINICAL DATA:  Chest pain and palpitations EXAM: PORTABLE CHEST 1 VIEW COMPARISON:  Radiographs 08/22/2022 and CT chest 03/16/2023 FINDINGS: Stable cardiomediastinal silhouette. The right hilar fullness is likely at least in part due to the right chest wall lesions better seen on CT 03/16/2023. Right perihilar and lower lobe consolidation. Small right pleural effusion. The left lung is clear. No pneumothorax. IMPRESSION: 1. Right perihilar and lower lobe atelectasis or pneumonia with small right pleural effusion. 2. Right hilar fullness is in at least part due to the right chest wall lesions better seen on CT 03/16/2023. Electronically Signed   By: Minerva Fester M.D.   On: 05/19/2023 19:17        Scheduled Meds:  enoxaparin (LOVENOX) injection  40 mg Subcutaneous Q24H   feeding supplement  1 Container Oral TID BM   morphine  30 mg Oral Q12H   polyethylene glycol  17 g Oral BID   senna  2 tablet Oral BID   sodium chloride flush  3 mL Intravenous Q12H   sulfamethoxazole-trimethoprim  1 tablet Oral Q12H   Continuous  Infusions:  sodium chloride 100 mL/hr at 05/20/23 0639     LOS: 0 days    Time spent: 35 minutes    Harsh Trulock A Cindy Brindisi, MD [facility]   If 7PM-7AM, please contact night-coverage [email or web address]  05/20/2023, 9:03 AM

## 2023-05-20 NOTE — Progress Notes (Signed)
Bilateral lower extremity venous duplex has been completed. Preliminary results can be found in CV Proc through chart review.   05/20/23 10:15 AM Olen Cordial RVT

## 2023-05-21 ENCOUNTER — Other Ambulatory Visit (HOSPITAL_COMMUNITY): Payer: Self-pay

## 2023-05-21 DIAGNOSIS — R52 Pain, unspecified: Secondary | ICD-10-CM | POA: Diagnosis not present

## 2023-05-21 LAB — PREPARE RBC (CROSSMATCH)

## 2023-05-21 LAB — CBC
HCT: 21.9 % — ABNORMAL LOW (ref 36.0–46.0)
Hemoglobin: 6.8 g/dL — CL (ref 12.0–15.0)
MCH: 26.9 pg (ref 26.0–34.0)
MCHC: 31.1 g/dL (ref 30.0–36.0)
MCV: 86.6 fL (ref 80.0–100.0)
Platelets: 394 10*3/uL (ref 150–400)
RBC: 2.53 MIL/uL — ABNORMAL LOW (ref 3.87–5.11)
RDW: 16.2 % — ABNORMAL HIGH (ref 11.5–15.5)
WBC: 5.5 10*3/uL (ref 4.0–10.5)
nRBC: 0 % (ref 0.0–0.2)

## 2023-05-21 LAB — BASIC METABOLIC PANEL
Anion gap: 6 (ref 5–15)
BUN: 13 mg/dL (ref 8–23)
CO2: 22 mmol/L (ref 22–32)
Calcium: 7.1 mg/dL — ABNORMAL LOW (ref 8.9–10.3)
Chloride: 107 mmol/L (ref 98–111)
Creatinine, Ser: 0.79 mg/dL (ref 0.44–1.00)
GFR, Estimated: >60 mL/min (ref >60)
Glucose, Bld: 99 mg/dL (ref 70–99)
Potassium: 3.4 mmol/L — ABNORMAL LOW (ref 3.5–5.1)
Sodium: 135 mmol/L (ref 135–145)

## 2023-05-21 LAB — HEMOGLOBIN AND HEMATOCRIT, BLOOD
HCT: 17.2 % — ABNORMAL LOW (ref 36.0–46.0)
Hemoglobin: 5.4 g/dL — CL (ref 12.0–15.0)

## 2023-05-21 MED ORDER — GABAPENTIN 100 MG PO CAPS
100.0000 mg | ORAL_CAPSULE | Freq: Three times a day (TID) | ORAL | Status: DC
  Administered 2023-05-21 – 2023-05-27 (×17): 100 mg via ORAL
  Filled 2023-05-21 (×19): qty 1

## 2023-05-21 MED ORDER — SODIUM CHLORIDE 0.9% IV SOLUTION: Freq: Once | INTRAVENOUS | Status: AC

## 2023-05-21 MED ORDER — POTASSIUM CHLORIDE CRYS ER 20 MEQ PO TBCR
40.0000 meq | EXTENDED_RELEASE_TABLET | Freq: Once | ORAL | Status: AC
  Administered 2023-05-21: 40 meq via ORAL
  Filled 2023-05-21: qty 2

## 2023-05-21 NOTE — Progress Notes (Signed)
PT Cancellation Note  Patient Details Name: Tashua Suplee MRN: [id number] DOB: 01-06-1961   Cancelled Treatment:    Reason Eval/Treat Not Completed: Medical issues which prohibited therapy (Hgb 6.8, transfusion ordered but hasn't yet started. Pt requested PT check back after transfusion has started. Will follow.)   Tamala Ser PT 05/21/2023  Acute Rehabilitation Services  Office [phone]

## 2023-05-21 NOTE — Consult Note (Signed)
Consultation Note Date: 05/21/2023   Patient Name: [name]  DOB: [birth year]  MRN: [id number]  Age / Sex: 62 y.o., female  PCP: Artis Delay, MD Referring Physician: Alba Cory, MD  Reason for Consultation: Pain control  HPI/Patient Profile: 62 y.o. female admitted on 05/19/2023  Clinical Assessment and Goals of Care: 62 year old lady with stage IV right breast cancer and cancer associated seated pain, ongoing weight loss and ongoing chest wall and back wounds. Patient follows with Dr. Bertis Ruddy from [facility] and is admitted to hospital medicine service for cancer associated pain and chest wall wounds which are not improving. History of consult for pain management and for ongoing support. Chart reviewed, palliative consult request received, patient seen and discussions held. Palliative medicine is specialized medical care for people living with serious illness. It focuses on providing relief from the symptoms and stress of a serious illness. The goal is to improve quality of life for both the patient and the family. Goals of care: Broad aims of medical therapy in relation to the patient's values and preferences. Our aim is to provide medical care aimed at enabling patients to achieve the goals that matter most to them, given the circumstances of their particular medical situation and their constraints.     NEXT OF KIN  Mother and sister.   Discussion/SUMMARY OF RECOMMENDATIONS   I discussed with patient.  Discussed with bedside nursing colleague.  Also discussed in an interdisciplinary manner between medical oncology is also pharmacy services.  Medication history reviewed. Agree with Flagyl IV, MetroGel Topical. Agree with IV Dilaudid as needed for now, patient also on MS Contin 30 mg p.o. every 12. Patient's mother and sister are next of kin Agree with DNR Will add  nonopioid  adjuvants such as gabapentin for neuropathic component to the pain. Exploring with oncology and pharmacy about the option of topical ketamine gel QID versus topical morphine gel.  10 mg of morphine injection is infused with 8 g of IntraSite gel 4 times a day and the wound is loosely dressed with gauze.  Will explore if this will be a beneficial option or not. Otherwise, continue current mode of care. Agree with SNF rehab upon discharge and recommend palliative support in the outpatient setting. Thank you for the consult.  Code Status/Advance Care Planning: DNR   Symptom Management:     Palliative Prophylaxis:  Frequent Pain Assessment  Additional Recommendations (Limitations, Scope, Preferences): Full Scope Treatment  Psycho-social/Spiritual:  Desire for further Chaplaincy support:yes Additional Recommendations: Caregiving  Support/Resources  Prognosis:  Unable to determine  Discharge Planning: Skilled Nursing Facility for rehab with Palliative care service follow-up      Primary Diagnoses: Present on Admission:  Intractable pain  Wound healing, delayed  Thrombocytosis  Breast cancer, stage 4, right (HCC)   I have reviewed the medical record, interviewed the patient and family, and examined the patient. The following aspects are pertinent.  Past Medical History:  Diagnosis Date   Hypertension    rt breast ca 02/2022  Social History   Socioeconomic History   Marital status: Single    Spouse name: Not on file   Number of children: 0   Years of education: Not on file   Highest education level: Not on file  Occupational History   Occupation: retired Runner, broadcasting/film/video  Tobacco Use   Smoking status: Never   Smokeless tobacco: Never  Substance and Sexual Activity   Alcohol use: No   Drug use: No   Sexual activity: Not on file  Other Topics Concern   Not on file  Social History Narrative   Not on file   Social Determinants of Health   Financial Resource Strain: Low  Risk  (04/03/2022)   Overall Financial Resource Strain (CARDIA)    Difficulty of Paying Living Expenses: Not hard at all  Food Insecurity: No Food Insecurity (05/20/2023)   Hunger Vital Sign    Worried About Running Out of Food in the Last Year: Never true    Ran Out of Food in the Last Year: Never true  Transportation Needs: No Transportation Needs (05/20/2023)   PRAPARE - Administrator, Civil Service (Medical): No    Lack of Transportation (Non-Medical): No  Physical Activity: Not on file  Stress: Not on file  Social Connections: Not on file   Family History  Problem Relation Age of Onset   Hypertension Mother    Heart disease Father    Stroke Father    Kidney disease Father    Hypertension Sister    Hypertension Maternal Grandmother    Stroke Maternal Grandfather    Diabetes Paternal Grandmother    Cancer Cousin    Scheduled Meds:  sodium chloride   Intravenous Once   enoxaparin (LOVENOX) injection  40 mg Subcutaneous Q24H   feeding supplement  1 Container Oral BID BM   feeding supplement  237 mL Oral BID BM   metroNIDAZOLE   Topical Daily   morphine  30 mg Oral Q12H   nutrition supplement (JUVEN)  1 packet Oral BID BM   polyethylene glycol  17 g Oral BID   senna  2 tablet Oral BID   sodium chloride flush  3 mL Intravenous Q12H   Continuous Infusions:  sodium chloride 100 mL/hr at 05/21/23 0725   cefTRIAXone (ROCEPHIN)  IV 2 g (05/20/23 1031)   metronidazole 500 mg (05/20/23 2216)   vancomycin     PRN Meds:.acetaminophen **OR** acetaminophen, bisacodyl, diphenhydrAMINE, HYDROmorphone (DILAUDID) injection, ondansetron **OR** ondansetron (ZOFRAN) IV Medications Prior to Admission:  Prior to Admission medications   Medication Sig Start Date End Date Taking? Authorizing Provider  calcium carbonate (TUMS - DOSED IN MG ELEMENTAL CALCIUM) 500 MG chewable tablet Chew 500 mg by mouth 2 (two) times daily.   Yes [provider]  capecitabine (XELODA) 500 MG  tablet Take 2 tablets (1,000 mg total) by mouth 2 (two) times daily after a meal. Take within 30 minutes after meals. Take for 14 days on, 7 days off. Repeat every 21 days. 04/17/23  Yes Gorsuch, Ni, MD  cholecalciferol (VITAMIN D3) 25 MCG (1000 UNIT) tablet Take 1,000 Units by mouth daily.   Yes [provider]  HYDROmorphone (DILAUDID) 8 MG tablet Take 1 tablet (8 mg total) by mouth every 6 (six) hours as needed for moderate pain. Patient taking differently: Take 8 mg by mouth in the morning, at noon, in the evening, and at bedtime. 03/19/23  Yes Gorsuch, Ni, MD  magnesium oxide (MAG-OX) 400 (240 Mg) MG tablet Take  1 tablet (400 mg total) by mouth daily. 06/12/22  Yes Gorsuch, Ni, MD  morphine (MS CONTIN) 30 MG 12 hr tablet Take 1 tablet (30 mg total) by mouth every 12 (twelve) hours. 04/07/23  Yes Artis Delay, MD  Multiple Vitamins-Minerals (MULTIVITAMIN WITH MINERALS) tablet Take 1 tablet by mouth daily.   Yes [provider]  polyethylene glycol (MIRALAX / GLYCOLAX) 17 g packet Take 17 g by mouth daily as needed for mild constipation or moderate constipation.   Yes [provider]  senna (SENOKOT) 8.6 MG tablet Take 1 tablet by mouth as needed for constipation.   Yes [provider]  sodium hypochlorite (DAKIN'S 1/4 STRENGTH) 0.125 % SOLN Use as directed for wound care Patient taking differently: Irrigate with 1 Application as directed daily. Use as directed for wound care 04/22/23  Yes    No Known Allergies Review of Systems +pain in chest wall and back.   Physical Exam Malodorous chest wall with significant purulent drainage. Alert resting in bed Appears thin and with ongoing generalized weakness and cachexia Regular work of breathing progressive weight loss evident  Vital Signs: BP 97/60 (BP Location: Left Arm)   Pulse (!) 110   Temp 98.2 F (36.8 C) (Oral)   Resp (!) 21   Ht 5\' 6"  (1.676 m)   Wt 55 kg   SpO2 95%   BMI 19.57 kg/m  Pain Scale:  0-10 POSS *See Group Information*: 1-Acceptable,Awake and alert Pain Score: 5    SpO2: SpO2: 95 % O2 Device:SpO2: 95 % O2 Flow Rate: .O2 Flow Rate (L/min): 2 L/min  IO: Intake/output summary:  Intake/Output Summary (Last 24 hours) at 05/21/2023 0901 Last data filed at 05/21/2023 0725 Gross per 24 hour  Intake 2721.11 ml  Output --  Net 2721.11 ml    LBM: Last BM Date : 05/20/23 Baseline Weight: Weight: 55 kg Most recent weight: Weight: 55 kg     Palliative Assessment/Data:   PPS 50%  Time In:  8 Time Out: 9  Time Total: 60   Greater than 50%  of this time was spent counseling and coordinating care related to the above assessment and plan.  Signed by: Rosalin Hawking, MD   Please contact Palliative Medicine Team phone at [phone] for questions and concerns.  For individual provider: See Loretha Stapler

## 2023-05-21 NOTE — Progress Notes (Signed)
PROGRESS NOTE    [name]  WUJ:[id number] DOB: 1961/01/31 DOA: 05/19/2023 PCP: Artis Delay, MD   Brief Narrative: 62 year old with past medical history significant for right breast cancer, with chronic right chest and back wounds, chronic cancer-related pain presents with uncontrolled pain at the site of her chronic wounds.  She has been taking MS Contin and oral Dilaudid at home as prescribed.  She has been noticed to have worsening drainage from her wounds.  She was also noted to be tachycardic with heart rates in the 140s.  Chest x-ray perihilar and lower lobe atelectasis versus pneumonia with a small right pleural effusion.   Assessment & Plan:   Principal Problem:   Intractable pain Active Problems:   Breast cancer, stage 4, right (HCC)   Thrombocytosis   Wound healing, delayed   1-Intractable cancer-related pain, chest;  Continue with MS Contin, changed frequency of IV Dilaudid to every 3 hours.  Palliative care has been consulted.  2-Chronic chest wall wound, with active infection: Patient having worsening drainage from wound and foul-smelling. -Continue with  IV vancomycin, ceftriaxone and Flagyl -Wound care consulted.  Breast cancer: On Xeloda under Dr. Bertis Ruddy care.    Tachycardia; suspect related to hypovolemia and pain.  Denies pleuritic chest pain.   Dopplers of lower extremity negative.  Debility: In the setting of malignancy PT consulted Hypokalemia: replete orally.   Anemia; of malignancy: monitor. Hb down to 6.9. plan to proceed with one unit PRBC>  Denies blood in the stool.   Estimated body mass index is 19.57 kg/m as calculated from the following:   Height as of this encounter: 5\' 6"  (1.676 m).   Weight as of this encounter: 55 kg.   DVT prophylaxis: Lovenox, continue for now. No gross evidence of bleeding Code Status: DNR Family Communication: Care discussed with patient.  Disposition Plan:  Status is: Observation The patient will require  care spanning > 2 midnights and should be moved to inpatient because: management of chest wound. Benefit from SNF    Consultants:  Oncology   Procedures:   Antimicrobials:    Subjective: She report pain at wound site.  She denies blood in the stool.  Objective: Vitals:   05/21/23 0305 05/21/23 0721 05/21/23 1225 05/21/23 1413  BP: (!) 93/56 97/60 (!) 98/57 (!) 109/58  Pulse: (!) 113 (!) 110 (!) 110 (!) 110  Resp: 20 (!) 21 16 20   Temp: 98.2 F (36.8 C) 98.2 F (36.8 C) 98.5 F (36.9 C) 98.6 F (37 C)  TempSrc: Oral Oral Oral Oral  SpO2: 95% 95% 99% 99%  Weight:      Height:        Intake/Output Summary (Last 24 hours) at 05/21/2023 1453 Last data filed at 05/21/2023 0725 Gross per 24 hour  Intake 2361.11 ml  Output --  Net 2361.11 ml   Filed Weights   05/19/23 1750  Weight: 55 kg    Examination:  General exam: NAD Respiratory system: CTA. Cardiovascular system: SS 1, S 2 RRR Chest wall with large open wound cover all anterior right side chest, purulent  drainage  Gastrointestinal system: BS present, soft nt Central nervous system: alert.     Data Reviewed: I have personally reviewed following labs and imaging studies  CBC: Recent Labs  Lab 05/19/23 1810 05/20/23 1012 05/21/23 0420  WBC 5.8 4.6 5.5  NEUTROABS 4.4  --   --   HGB 9.1* 7.5* 6.8*  HCT 29.0* 23.9* 21.9*  MCV 86.6 86.3 86.6  PLT 544* 402* 394   Basic Metabolic Panel: Recent Labs  Lab 05/19/23 1810 05/20/23 1012 05/21/23 0420  NA 135 134* 135  K 3.7 3.4* 3.4*  CL 99 101 107  CO2 24 25 22   GLUCOSE 141* 135* 99  BUN 20 17 13   CREATININE 0.97 0.86 0.79  CALCIUM 8.3* 7.4* 7.1*  MG  --  2.2  --    GFR: Estimated Creatinine Clearance: 62.5 mL/min (by C-G formula based on SCr of 0.79 mg/dL). Liver Function Tests: Recent Labs  Lab 05/19/23 1810  AST 43*  ALT 12  ALKPHOS 73  BILITOT 0.6  PROT 6.6  ALBUMIN 2.7*   No results for input(s): "LIPASE", "AMYLASE" in the last  168 hours. No results for input(s): "AMMONIA" in the last 168 hours. Coagulation Profile: No results for input(s): "INR", "PROTIME" in the last 168 hours. Cardiac Enzymes: No results for input(s): "CKTOTAL", "CKMB", "CKMBINDEX", "TROPONINI" in the last 168 hours. BNP (last 3 results) No results for input(s): "PROBNP" in the last 8760 hours. HbA1C: No results for input(s): "HGBA1C" in the last 72 hours. CBG: No results for input(s): "GLUCAP" in the last 168 hours. Lipid Profile: No results for input(s): "CHOL", "HDL", "LDLCALC", "TRIG", "CHOLHDL", "LDLDIRECT" in the last 72 hours. Thyroid Function Tests: No results for input(s): "TSH", "T4TOTAL", "FREET4", "T3FREE", "THYROIDAB" in the last 72 hours. Anemia Panel: No results for input(s): "VITAMINB12", "FOLATE", "FERRITIN", "TIBC", "IRON", "RETICCTPCT" in the last 72 hours. Sepsis Labs: No results for input(s): "PROCALCITON", "LATICACIDVEN" in the last 168 hours.  Recent Results (from the past 240 hour(s))  Aerobic Culture w Gram Stain (superficial specimen)     Status: None (Preliminary result)   Collection Time: 05/20/23 12:38 PM   Specimen: Breast; Wound  Result Value Ref Range Status   Specimen Description   Final    BREAST Performed at [facility], [address], [place], [place] [address]    Special Requests   Final    Immunocompromised Performed at [facility], [address], [place], [place] [address]    Gram Stain   Final    NO WBC SEEN RARE GRAM POSITIVE COCCI IN PAIRS RARE GRAM POSITIVE RODS    Culture   Final    ABUNDANT STAPHYLOCOCCUS AUREUS SUSCEPTIBILITIES TO FOLLOW Performed at [facility], [address], [place], [place] [address]    Report Status PENDING  Incomplete         Radiology Studies: VAS [place] LOWER EXTREMITY VENOUS (DVT)  Result Date: 05/20/2023  Lower Venous DVT Study Patient Name:  [name]  Date of Exam:   05/20/2023 Medical Rec #:  [id number]        Accession #:    [id number] Date of Birth: [birth year]         Patient Gender: F Patient Age:   55 years Exam Location:  [facility] Procedure:      VAS [place] LOWER EXTREMITY VENOUS (DVT) Referring Phys: Jon Billings Lyndal Reggio --------------------------------------------------------------------------------  Indications: Edema.  Risk Factors: Cancer. Comparison Study: No prior studies. Performing Technologist: Chanda Busing RVT  Examination Guidelines: A complete evaluation includes B-mode imaging, spectral Doppler, color Doppler, and power Doppler as needed of all accessible portions of each vessel. Bilateral testing is considered an integral part of a complete examination. Limited examinations for reoccurring indications may be performed as noted. The reflux portion of the exam is performed with the patient in reverse Trendelenburg.  +---------+---------------+---------+-----------+----------+--------------+ RIGHT    CompressibilityPhasicitySpontaneityPropertiesThrombus Aging +---------+---------------+---------+-----------+----------+--------------+  CFV      Full           Yes      Yes                                 +---------+---------------+---------+-----------+----------+--------------+ SFJ      Full                                                        +---------+---------------+---------+-----------+----------+--------------+ FV Prox  Full                                                        +---------+---------------+---------+-----------+----------+--------------+ FV Mid   Full                                                        +---------+---------------+---------+-----------+----------+--------------+ FV DistalFull                                                        +---------+---------------+---------+-----------+----------+--------------+ PFV      Full                                                         +---------+---------------+---------+-----------+----------+--------------+ POP      Full           Yes      Yes                                 +---------+---------------+---------+-----------+----------+--------------+ PTV      Full                                                        +---------+---------------+---------+-----------+----------+--------------+ PERO     Full                                                        +---------+---------------+---------+-----------+----------+--------------+   +---------+---------------+---------+-----------+----------+--------------+ LEFT     CompressibilityPhasicitySpontaneityPropertiesThrombus Aging +---------+---------------+---------+-----------+----------+--------------+ CFV      Full           Yes      Yes                                 +---------+---------------+---------+-----------+----------+--------------+  SFJ      Full                                                        +---------+---------------+---------+-----------+----------+--------------+ FV Prox  Full                                                        +---------+---------------+---------+-----------+----------+--------------+ FV Mid   Full                                                        +---------+---------------+---------+-----------+----------+--------------+ FV DistalFull                                                        +---------+---------------+---------+-----------+----------+--------------+ PFV      Full                                                        +---------+---------------+---------+-----------+----------+--------------+ POP      Full           Yes      Yes                                 +---------+---------------+---------+-----------+----------+--------------+ PTV      Full                                                         +---------+---------------+---------+-----------+----------+--------------+ PERO     Full                                                        +---------+---------------+---------+-----------+----------+--------------+     Summary: RIGHT: - There is no evidence of deep vein thrombosis in the lower extremity.  - No cystic structure found in the popliteal fossa.  LEFT: - There is no evidence of deep vein thrombosis in the lower extremity.  - No cystic structure found in the popliteal fossa.  *See table(s) above for measurements and observations. Electronically signed by Heath Lark on 05/20/2023 at 8:50:49 PM.    Final    DG Chest Port 1 View  Result Date: 05/19/2023 CLINICAL DATA:  Chest pain and palpitations EXAM: PORTABLE CHEST 1 VIEW COMPARISON:  Radiographs 08/22/2022 and CT chest 03/16/2023 FINDINGS: Stable cardiomediastinal silhouette.  The right hilar fullness is likely at least in part due to the right chest wall lesions better seen on CT 03/16/2023. Right perihilar and lower lobe consolidation. Small right pleural effusion. The left lung is clear. No pneumothorax. IMPRESSION: 1. Right perihilar and lower lobe atelectasis or pneumonia with small right pleural effusion. 2. Right hilar fullness is in at least part due to the right chest wall lesions better seen on CT 03/16/2023. Electronically Signed   By: Minerva Fester M.D.   On: 05/19/2023 19:17        Scheduled Meds:  enoxaparin (LOVENOX) injection  40 mg Subcutaneous Q24H   feeding supplement  1 Container Oral BID BM   feeding supplement  237 mL Oral BID BM   gabapentin  100 mg Oral TID   metroNIDAZOLE   Topical Daily   morphine  30 mg Oral Q12H   nutrition supplement (JUVEN)  1 packet Oral BID BM   polyethylene glycol  17 g Oral BID   senna  2 tablet Oral BID   sodium chloride flush  3 mL Intravenous Q12H   Continuous Infusions:  sodium chloride 100 mL/hr at 05/21/23 0725   cefTRIAXone (ROCEPHIN)  IV 2 g (05/21/23 1113)    metronidazole 500 mg (05/21/23 1056)   vancomycin 1,000 mg (05/21/23 1211)     LOS: 1 day    Time spent: 35 minutes    Vinetta Brach A Janye Maynor, MD [facility]   If 7PM-7AM, please contact night-coverage [email or web address]  05/21/2023, 2:53 PM

## 2023-05-21 NOTE — Evaluation (Addendum)
Physical Therapy Evaluation Patient Details Name: [name] MRN: [id number] DOB: 08-Jan-1961 Today's Date: 05/21/2023  History of Present Illness  62 year old with past medical history significant for right breast cancer, with chronic right chest and back wounds, chronic cancer-related pain presents with uncontrolled pain at the site of her chronic wounds.  She has been taking MS Contin and oral Dilaudid at home as prescribed.  She has been noticed to have worsening drainage from her wounds.  She was also noted to be tachycardic with heart rates in the 140s.  Chest x-ray perihilar and lower lobe atelectasis versus pneumonia with a small right pleural effusion. PMH: HTN, ankle surgery, tachycardia.  Clinical Impression  Pt admitted with above diagnosis. Pt required min assist for supine to sit. She ambulated ~30' with hand held assist of 1, HR 135 with walking, 2/4 dyspnea, distance limited by fatigue. Pt reports pain with minimal movement of RUE and holds it guarded against her side. Patient will benefit from continued inpatient follow up therapy, <3 hours/day  Pt currently with functional limitations due to the deficits listed below (see PT Problem List). Pt will benefit from acute skilled PT to increase their independence and safety with mobility to allow discharge.           If plan is discharge home, recommend the following: A lot of help with bathing/dressing/bathroom;A little help with walking and/or transfers;Assistance with cooking/housework;Assist for transportation;Help with stairs or ramp for entrance   Can travel by private vehicle   Yes    Equipment Recommendations Other (comment) (TBD @ SNF)  Recommendations for Other Services       Functional Status Assessment Patient has had a recent decline in their functional status and demonstrates the ability to make significant improvements in function in a reasonable and predictable amount of time.     Precautions / Restrictions  Precautions Precautions: Fall Precaution Comments: denies falls in past 6 months;  wounds R upper chest and back Restrictions Weight Bearing Restrictions: No      Mobility  Bed Mobility Overal bed mobility: Needs Assistance Bed Mobility: Supine to Sit     Supine to sit: Min assist     General bed mobility comments: assist to raise trunk    Transfers Overall transfer level: Needs assistance Equipment used: 1 person hand held assist Transfers: Sit to/from Stand Sit to Stand: Contact guard assist                Ambulation/Gait Ambulation/Gait assistance: Min assist Gait Distance (Feet): 30 Feet Assistive device: 1 person hand held assist Gait Pattern/deviations: Step-through pattern, Decreased stride length Gait velocity: WFL     General Gait Details: steady, no loss of balance, HR 135 with walking, 2/4 dyspnea  Stairs            Wheelchair Mobility     Tilt Bed    Modified Rankin (Stroke Patients Only)       Balance Overall balance assessment: Modified Independent                                           Pertinent Vitals/Pain Pain Assessment Pain Assessment: 0-10 Pain Score: 9  Pain Location: R upper chest Pain Descriptors / Indicators: Sore, Grimacing Pain Intervention(s): Limited activity within patient's tolerance, Monitored during session, Premedicated before session    Home Living Family/patient expects to be discharged to:: Skilled nursing facility  Living Arrangements: Alone                      Prior Function Prior Level of Function : Independent/Modified Independent             Mobility Comments: reports she walks without an AD, was crawling up flight of steps to get to second floor; denies falls in past 6 months ADLs Comments: independent with bathing in walk in shower; family brought in groceries     Extremity/Trunk Assessment   Upper Extremity Assessment Upper Extremity Assessment: RUE  deficits/detail RUE Deficits / Details: pt guards RUE 2* pain with slight movement RUE: Unable to fully assess due to pain    Lower Extremity Assessment Lower Extremity Assessment: Overall WFL for tasks assessed    Cervical / Trunk Assessment Cervical / Trunk Assessment: Normal  Communication   Communication Communication: No apparent difficulties  Cognition Arousal: Alert Behavior During Therapy: WFL for tasks assessed/performed Overall Cognitive Status: Within Functional Limits for tasks assessed                                          General Comments      Exercises  Ankle pumps x 10 both AROM seated Long arc quads x 10 both AROM seated Marching x 10 both AROM seated   Assessment/Plan    PT Assessment Patient needs continued PT services  PT Problem List Decreased activity tolerance;Decreased mobility;Pain       PT Treatment Interventions Gait training;Functional mobility training;Therapeutic activities;Therapeutic exercise;Patient/family education    PT Goals (Current goals can be found in the Care Plan section)  Acute Rehab PT Goals Patient Stated Goal: church, community work PT Goal Formulation: With patient Time For Goal Achievement: 06/04/23 Potential to Achieve Goals: Good    Frequency Min 1X/week     Co-evaluation               AM-PAC PT "6 Clicks" Mobility  Outcome Measure Help needed turning from your back to your side while in a flat bed without using bedrails?: A Little Help needed moving from lying on your back to sitting on the side of a flat bed without using bedrails?: A Little Help needed moving to and from a bed to a chair (including a wheelchair)?: A Little Help needed standing up from a chair using your arms (e.g., wheelchair or bedside chair)?: A Little Help needed to walk in hospital room?: A Little Help needed climbing 3-5 steps with a railing? : A Lot 6 Click Score: 17    End of Session   Activity  Tolerance: Patient tolerated treatment well Patient left: in chair;with chair alarm set;with call bell/phone within reach Nurse Communication: Mobility status PT Visit Diagnosis: Difficulty in walking, not elsewhere classified (R26.2)    Time: 1610-9604 PT Time Calculation (min) (ACUTE ONLY): 31 min   Charges:   PT Evaluation $PT Eval Moderate Complexity: 1 Mod PT Treatments $Gait Training: 8-22 mins PT General Charges $$ ACUTE PT VISIT: 1 Visit         Tamala Ser PT 05/21/2023  Acute Rehabilitation Services  Office [phone]

## 2023-05-21 NOTE — TOC Progression Note (Signed)
Transition of Care [facility]) - Progression Note    Patient Details  Name: [name] MRN: [id number] Date of Birth: 05-13-1961  Transition of Care [facility]) CM/SW Contact  Larrie Kass, LCSW Phone Number: 05/21/2023, 1:26 PM  Clinical Narrative:    CSW met with pt, she is requesting SNF placement for Wound care. CSW explained the process, pt will need insurance authorizations. Pt reports she ambulates with no assistance. TOC to follow.    Expected Discharge Plan: Skilled Nursing Facility Barriers to Discharge: Continued Medical Work up  Expected Discharge Plan and Services In-house Referral: Clinical Social Work     Living arrangements for the past 2 months: Apartment                                       Social Determinants of Health (SDOH) Interventions SDOH Screenings   Food Insecurity: No Food Insecurity (05/20/2023)  Housing: Low Risk  (05/20/2023)  Transportation Needs: No Transportation Needs (05/20/2023)  Utilities: Not At Risk (05/20/2023)  Financial Resource Strain: Low Risk  (04/03/2022)  Tobacco Use: Low Risk  (05/19/2023)    Readmission Risk Interventions    08/30/2022    1:46 PM 08/06/2022   10:01 AM  Readmission Risk Prevention Plan  Transportation Screening Complete   PCP or Specialist Appt within 3-5 Days Complete   HRI or Home Care Consult Complete   Social Work Consult for Recovery Care Planning/Counseling Complete   Palliative Care Screening Not Applicable   Medication Review Oceanographer) Complete Complete  PCP or Specialist appointment within 3-5 days of discharge  Complete  HRI or Home Care Consult  Complete  SW Recovery Care/Counseling Consult  Complete  Palliative Care Screening  Not Applicable  Skilled Nursing Facility  Complete

## 2023-05-21 NOTE — [place] FL2 (Signed)
[place] MEDICAID FL2 LEVEL OF CARE FORM     IDENTIFICATION  Patient Name: [name] Birthdate: 1961-07-02 Sex: female Admission Date (Current Location): 05/19/2023  [facility] and [place] Number:  Producer, television/film/video and Address:  [facility],  [address], [place] [address]      Provider Number: [id number]  Attending Physician Name and Address:  Alba Cory, MD  Relative Name and Phone Number:       Current Level of Care: Hospital Recommended Level of Care: Skilled Nursing Facility Prior Approval Number:    Date Approved/Denied:   PASRR Number: [id number]  Discharge Plan: SNF    Current Diagnoses: Patient Active Problem List   Diagnosis Date Noted   Intractable pain 05/19/2023   Non compliance with medical treatment 05/08/2023   Anemia due to antineoplastic chemotherapy 04/07/2023   Weight loss, non-intentional 01/22/2023   MSSA (methicillin susceptible Staphylococcus aureus) infection 08/12/2022   Empyema, right (HCC) 08/11/2022   Need for management of chest tube 08/11/2022   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   Need for emotional support 08/05/2022   Constipation 08/05/2022   Palliative care encounter 08/05/2022   Protein-calorie malnutrition, severe 08/05/2022   Pleural effusion, right 08/04/2022   Pleural effusion on right 08/02/2022   Protein calorie malnutrition (HCC) 08/02/2022   Goals of care, counseling/discussion 07/25/2022   Vitamin D deficiency 06/12/2022   Situational depression 05/30/2022   GERD (gastroesophageal reflux disease) 05/30/2022   Pancytopenia, acquired (HCC) 05/08/2022   Wound healing, delayed 05/08/2022   Physical deconditioning 04/17/2022   Poor social situation 04/02/2022   Cancer associated pain 03/31/2022   Poor venous access 03/31/2022   Other constipation 03/31/2022   Breast cancer metastasized to bone, right (HCC) 03/23/2022   Breast cancer, stage 4, right (HCC) 03/22/2022   Pleural  effusion 03/22/2022   Cellulitis 03/22/2022   Microcytic anemia 03/22/2022   Thrombocytosis 03/22/2022   Lactic acidosis 03/22/2022   Essential hypertension 01/27/2018   Screening for diabetes mellitus 01/27/2018   Screening for thyroid disorder 01/27/2018    Orientation RESPIRATION BLADDER Height & Weight     Self, Time, Situation, Place  Normal Continent Weight: 121 lb 4.1 oz (55 kg) Height:  5\' 6"  (167.6 cm)  BEHAVIORAL SYMPTOMS/MOOD NEUROLOGICAL BOWEL NUTRITION STATUS      Continent Diet (regular)  AMBULATORY STATUS COMMUNICATION OF NEEDS Skin   Independent Verbally Other (Comment) (Non pressure wound upper back; right. Non pressure wound right breast; open across right breast , weeping)                       Personal Care Assistance Level of Assistance  Bathing, Feeding, Dressing Bathing Assistance: Independent Feeding assistance: Independent Dressing Assistance: Independent     Functional Limitations Info  Sight, Hearing, Speech Sight Info: Adequate Hearing Info: Adequate Speech Info: Adequate    SPECIAL CARE FACTORS FREQUENCY                       Contractures Contractures Info: Not present    Additional Factors Info  Code Status, Allergies Code Status Info: DNR Allergies Info: no known Allergies           Current Medications (05/21/2023):  This is the current hospital active medication list Current Facility-Administered Medications  Medication Dose Route Frequency Provider Last Rate Last Admin   0.9 %  sodium chloride infusion   Intravenous Continuous Regalado, Belkys A, MD 100 mL/hr at 05/21/23 0725 Infusion  Verify at 05/21/23 0725   acetaminophen (TYLENOL) tablet 650 mg  650 mg Oral Q6H PRN Opyd, Lavone Neri, MD       Or   acetaminophen (TYLENOL) suppository 650 mg  650 mg Rectal Q6H PRN Opyd, Lavone Neri, MD       bisacodyl (DULCOLAX) EC tablet 5 mg  5 mg Oral Daily PRN Opyd, Lavone Neri, MD       cefTRIAXone (ROCEPHIN) 2 g in sodium chloride  0.9 % 100 mL IVPB  2 g Intravenous Q24H Regalado, Belkys A, MD 200 mL/hr at 05/21/23 1113 2 g at 05/21/23 1113   diphenhydrAMINE (BENADRYL) capsule 25 mg  25 mg Oral Q6H PRN Regalado, Belkys A, MD   25 mg at 05/20/23 1350   enoxaparin (LOVENOX) injection 40 mg  40 mg Subcutaneous Q24H Opyd, Lavone Neri, MD   40 mg at 05/20/23 2210   feeding supplement (BOOST / RESOURCE BREEZE) liquid 1 Container  1 Container Oral BID BM Regalado, Belkys A, MD   1 Container at 05/21/23 1057   feeding supplement (ENSURE ENLIVE / ENSURE PLUS) liquid 237 mL  237 mL Oral BID BM Regalado, Belkys A, MD   237 mL at 05/21/23 1057   gabapentin (NEURONTIN) capsule 100 mg  100 mg Oral TID Rosalin Hawking, MD   100 mg at 05/21/23 1054   HYDROmorphone (DILAUDID) injection 1-2 mg  1-2 mg Intravenous Q3H PRN Regalado, Belkys A, MD   2 mg at 05/21/23 0603   metroNIDAZOLE (FLAGYL) IVPB 500 mg  500 mg Intravenous Q12H Regalado, Belkys A, MD 100 mL/hr at 05/21/23 1056 500 mg at 05/21/23 1056   metroNIDAZOLE (METROGEL) 0.75 % gel   Topical Daily Regalado, Belkys A, MD   Given at 05/21/23 1057   morphine (MS CONTIN) 12 hr tablet 30 mg  30 mg Oral Q12H Opyd, Lavone Neri, MD   30 mg at 05/21/23 1053   nutrition supplement (JUVEN) (JUVEN) powder packet 1 packet  1 packet Oral BID BM Regalado, Belkys A, MD   1 packet at 05/21/23 1055   ondansetron (ZOFRAN) tablet 4 mg  4 mg Oral Q6H PRN Opyd, Lavone Neri, MD       Or   ondansetron (ZOFRAN) injection 4 mg  4 mg Intravenous Q6H PRN Opyd, Lavone Neri, MD   4 mg at 05/20/23 1350   polyethylene glycol (MIRALAX / GLYCOLAX) packet 17 g  17 g Oral BID Opyd, Lavone Neri, MD   17 g at 05/21/23 1055   senna (SENOKOT) tablet 17.2 mg  2 tablet Oral BID Opyd, Lavone Neri, MD   17.2 mg at 05/20/23 1026   sodium chloride flush (NS) 0.9 % injection 3 mL  3 mL Intravenous Q12H Opyd, Lavone Neri, MD   3 mL at 05/21/23 1057   vancomycin (VANCOCIN) IVPB 1000 mg/200 mL premix  1,000 mg Intravenous Q24H Regalado, Belkys A, MD 200  mL/hr at 05/21/23 1211 1,000 mg at 05/21/23 1211     Discharge Medications: Please see discharge summary for a list of discharge medications.  Relevant Imaging Results:  Relevant Lab Results:   Additional Information SSN:[id number]  Valentina Shaggy Hasson Gaspard, LCSW

## 2023-05-21 NOTE — Progress Notes (Signed)
    Patient Name: [name]           DOB: Aug 10, 1961  MRN: [id number]      Admission Date: 05/19/2023  Attending Provider: Alba Cory, MD  Primary Diagnosis: Intractable pain   Level of care: Telemetry    CROSS COVER NOTE   Date of Service   05/21/2023   [name], 62 y.o. female, was admitted on 05/19/2023 for Intractable pain.    HPI/Events of Note   Acute on Chronic Anemia Hemoglobin 7.5 -->  6.8.  Baseline hemoglobin ~ 9-11 No acute changes reported.  Hemodynamically stable. No melena, hematochezia, or other bleeding reported tonight.  Some weeping from right chest wound with scant blood reported.   Interventions/ Plan   Blood transfusion, 1 unit PRBC        Anthoney Harada, DNP, [employer] [facility] [place]

## 2023-05-21 NOTE — Progress Notes (Signed)
    Patient Name: [name]           DOB: 07-17-1961  MRN: [id number]      Admission Date: 05/19/2023  Attending Provider: Alba Cory, MD  Primary Diagnosis: Intractable pain   Level of care: Telemetry    CROSS COVER NOTE   Date of Service   05/21/2023   [name], 62 y.o. female, was admitted on 05/19/2023 for Intractable pain.    HPI/Events of Note   Acute on Chronic Anemia,  Hemoglobin 6.8 -->  5.4.  Baseline hemoglobin ~ 9-11 No acute changes reported.  Hemodynamically stable. No melena, hematochezia, or other bleeding reported tonight.  Some serosanguineous fluid from right chest wound.    Interventions/ Plan   Blood transfusion, 2 unit PRBC Recheck H&H 2 hours after blood transfusion is complete. Fecal occult- pending        Anthoney Harada, DNP, [employer] [facility] [place]

## 2023-05-21 NOTE — Progress Notes (Signed)
[name]   DOB:[birth year]   GN#:[id number]    ASSESSMENT & PLAN:  Breast cancer, stage 4, right (HCC) Her chest wall mass is worse I do not believe she is responding to Xeloda even though we just started her on low-dose recently  However, given her chest wall infection, I will hold her Xeloda while hospitalized   wound healing, delayed She was seen by wound care center who recommended advanced home care for follow-up The patient is not capable of performing dressing changes herself Unfortunately, the patient has told advanced home care nurse not to return At this point in time, after long conversation, she is willing to consider being admitted to skilled nursing facility for assistance Appreciate wound care consult as well as dressing changes while she is in the hospital   cancer associated pain We have extensive discussion about pain control The patient repeatedly stated that her pain is not well-controlled I have counted her pills multiple times in the outpatient clinic and felt that the patient is not taking her medication as directed Appreciate palliative care team to assist in pain management   Severe anemia Likely due to bleeding and anemia of chronic illness Agree with blood transfusion  Weight loss, non-intentional She has progressive weight loss Previously I have prescribed Remeron but the patient has not been taking it She has not been eating well and not been taking her nutritional supplement Will get dietitian to see   Non compliance with medical treatment She has been noncompliant with medications and prescribed treatment She is not capable of following instruction Her mother tried to help with pill organization but the patient has declined this repeatedly for her mother to get involved She has declined home physical therapy She have declined for home occupational therapy She has not been taking her medications as prescribed and overall, I do not feel that the  patient is capable of taking care of herself We will consult social worker to look for skilled nursing facility for admission; unfortunately, outpatient social worker has exhausted all options The patient need to be placed in skilled nursing facility upon discharge   Goals of care, counseling/discussion I have numerous goals of care discussion with the patient and family in the past The patient is declining due to inability to take care of herself and inability to take medications and treatment as prescribed I felt that she is on rapid decline and I do not believe she is going to survive this cancer without additional help The patient does not want to stop treatment   Discharge planning Unknown   All questions were answered. The patient knows to call the clinic with any problems, questions or concerns.   The total time spent in the appointment was 40 minutes encounter with patients including review of chart and various tests results, discussions about plan of care and coordination of care plan  Artis Delay, MD 05/21/2023 2:19 PM  Subjective:  She felt better today.  She is eating lunch.  Awaiting blood transfusion.  She is receiving broad-spectrum IV antibiotics.  Denies nausea or constipation.  Pain control is about the same  Objective:  Vitals:   05/21/23 1225 05/21/23 1413  BP: (!) 98/57 (!) 109/58  Pulse: (!) 110 (!) 110  Resp: 16 20  Temp: 98.5 F (36.9 C) 98.6 F (37 C)  SpO2: 99% 99%     Intake/Output Summary (Last 24 hours) at 05/21/2023 1419 Last data filed at 05/21/2023 0725 Gross per 24 hour  Intake 2361.11  ml  Output --  Net 2361.11 ml    GENERAL:alert, no distress and comfortable SKIN: Noted bandages covering her chest wall.  Her skin lesions are not bleeding but no odor is noted NEURO: alert & oriented x 3 with fluent speech, no focal motor/sensory deficits   Labs:  Recent Labs    04/17/23 1338 05/08/23 1510 05/19/23 1810 05/20/23 1012 05/21/23 0420   NA 140 138 135 134* 135  K 3.8 4.3 3.7 3.4* 3.4*  CL 104 102 99 101 107  CO2 30 29 24 25 22   GLUCOSE 106* 128* 141* 135* 99  BUN 12 19 20 17 13   CREATININE 0.89 1.06* 0.97 0.86 0.79  CALCIUM 9.2 8.6* 8.3* 7.4* 7.1*  GFRNONAA >60 59* >60 >60 >60  PROT 6.9 6.4* 6.6  --   --   ALBUMIN 3.5 3.2* 2.7*  --   --   AST 41 33 43*  --   --   ALT 9 6 12   --   --   ALKPHOS 57 64 73  --   --   BILITOT 0.5 0.3 0.6  --   --     Studies:  VAS [place] LOWER EXTREMITY VENOUS (DVT)  Result Date: 05/20/2023  Lower Venous DVT Study Patient Name:  [name]  Date of Exam:   05/20/2023 Medical Rec #: [id number]        Accession #:    [id number] Date of Birth: March 29, 1961         Patient Gender: F Patient Age:   89 years Exam Location:  [facility] Procedure:      VAS [place] LOWER EXTREMITY VENOUS (DVT) Referring Phys: Jon Billings REGALADO --------------------------------------------------------------------------------  Indications: Edema.  Risk Factors: Cancer. Comparison Study: No prior studies. Performing Technologist: Chanda Busing RVT  Examination Guidelines: A complete evaluation includes B-mode imaging, spectral Doppler, color Doppler, and power Doppler as needed of all accessible portions of each vessel. Bilateral testing is considered an integral part of a complete examination. Limited examinations for reoccurring indications may be performed as noted. The reflux portion of the exam is performed with the patient in reverse Trendelenburg.  +---------+---------------+---------+-----------+----------+--------------+ RIGHT    CompressibilityPhasicitySpontaneityPropertiesThrombus Aging +---------+---------------+---------+-----------+----------+--------------+ CFV      Full           Yes      Yes                                 +---------+---------------+---------+-----------+----------+--------------+ SFJ      Full                                                         +---------+---------------+---------+-----------+----------+--------------+ FV Prox  Full                                                        +---------+---------------+---------+-----------+----------+--------------+ FV Mid   Full                                                        +---------+---------------+---------+-----------+----------+--------------+  FV DistalFull                                                        +---------+---------------+---------+-----------+----------+--------------+ PFV      Full                                                        +---------+---------------+---------+-----------+----------+--------------+ POP      Full           Yes      Yes                                 +---------+---------------+---------+-----------+----------+--------------+ PTV      Full                                                        +---------+---------------+---------+-----------+----------+--------------+ PERO     Full                                                        +---------+---------------+---------+-----------+----------+--------------+   +---------+---------------+---------+-----------+----------+--------------+ LEFT     CompressibilityPhasicitySpontaneityPropertiesThrombus Aging +---------+---------------+---------+-----------+----------+--------------+ CFV      Full           Yes      Yes                                 +---------+---------------+---------+-----------+----------+--------------+ SFJ      Full                                                        +---------+---------------+---------+-----------+----------+--------------+ FV Prox  Full                                                        +---------+---------------+---------+-----------+----------+--------------+ FV Mid   Full                                                         +---------+---------------+---------+-----------+----------+--------------+ FV DistalFull                                                        +---------+---------------+---------+-----------+----------+--------------+  PFV      Full                                                        +---------+---------------+---------+-----------+----------+--------------+ POP      Full           Yes      Yes                                 +---------+---------------+---------+-----------+----------+--------------+ PTV      Full                                                        +---------+---------------+---------+-----------+----------+--------------+ PERO     Full                                                        +---------+---------------+---------+-----------+----------+--------------+     Summary: RIGHT: - There is no evidence of deep vein thrombosis in the lower extremity.  - No cystic structure found in the popliteal fossa.  LEFT: - There is no evidence of deep vein thrombosis in the lower extremity.  - No cystic structure found in the popliteal fossa.  *See table(s) above for measurements and observations. Electronically signed by Heath Lark on 05/20/2023 at 8:50:49 PM.    Final    DG Chest Port 1 View  Result Date: 05/19/2023 CLINICAL DATA:  Chest pain and palpitations EXAM: PORTABLE CHEST 1 VIEW COMPARISON:  Radiographs 08/22/2022 and CT chest 03/16/2023 FINDINGS: Stable cardiomediastinal silhouette. The right hilar fullness is likely at least in part due to the right chest wall lesions better seen on CT 03/16/2023. Right perihilar and lower lobe consolidation. Small right pleural effusion. The left lung is clear. No pneumothorax. IMPRESSION: 1. Right perihilar and lower lobe atelectasis or pneumonia with small right pleural effusion. 2. Right hilar fullness is in at least part due to the right chest wall lesions better seen on CT 03/16/2023. Electronically Signed    By: Minerva Fester M.D.   On: 05/19/2023 19:17

## 2023-05-22 ENCOUNTER — Ambulatory Visit: Payer: BC Managed Care – PPO | Admitting: Hematology and Oncology

## 2023-05-22 ENCOUNTER — Other Ambulatory Visit: Payer: BC Managed Care – PPO

## 2023-05-22 DIAGNOSIS — R52 Pain, unspecified: Secondary | ICD-10-CM | POA: Diagnosis not present

## 2023-05-22 LAB — CBC
HCT: 36.1 % (ref 36.0–46.0)
Hemoglobin: 11.6 g/dL — ABNORMAL LOW (ref 12.0–15.0)
MCH: 28.6 pg (ref 26.0–34.0)
MCHC: 32.1 g/dL (ref 30.0–36.0)
MCV: 88.9 fL (ref 80.0–100.0)
Platelets: 351 10*3/uL (ref 150–400)
RBC: 4.06 MIL/uL (ref 3.87–5.11)
RDW: 16.1 % — ABNORMAL HIGH (ref 11.5–15.5)
WBC: 6 10*3/uL (ref 4.0–10.5)
nRBC: 0 % (ref 0.0–0.2)

## 2023-05-22 LAB — BASIC METABOLIC PANEL
Anion gap: 7 (ref 5–15)
BUN: 12 mg/dL (ref 8–23)
CO2: 21 mmol/L — ABNORMAL LOW (ref 22–32)
Calcium: 7 mg/dL — ABNORMAL LOW (ref 8.9–10.3)
Chloride: 110 mmol/L (ref 98–111)
Creatinine, Ser: 0.66 mg/dL (ref 0.44–1.00)
GFR, Estimated: >60 mL/min (ref >60)
Glucose, Bld: 113 mg/dL — ABNORMAL HIGH (ref 70–99)
Potassium: 3.3 mmol/L — ABNORMAL LOW (ref 3.5–5.1)
Sodium: 138 mmol/L (ref 135–145)

## 2023-05-22 LAB — AEROBIC CULTURE W GRAM STAIN (SUPERFICIAL SPECIMEN): Gram Stain: NONE SEEN

## 2023-05-22 MED ORDER — POTASSIUM CHLORIDE CRYS ER 20 MEQ PO TBCR
40.0000 meq | EXTENDED_RELEASE_TABLET | Freq: Once | ORAL | Status: AC
  Administered 2023-05-22: 40 meq via ORAL
  Filled 2023-05-22: qty 2

## 2023-05-22 MED ORDER — HYDROMORPHONE HCL 2 MG PO TABS
8.0000 mg | ORAL_TABLET | ORAL | Status: DC | PRN
  Administered 2023-05-23 – 2023-05-26 (×4): 8 mg via ORAL
  Filled 2023-05-22 (×4): qty 4

## 2023-05-22 MED ORDER — ACETAMINOPHEN 500 MG PO TABS
500.0000 mg | ORAL_TABLET | Freq: Four times a day (QID) | ORAL | Status: DC
  Administered 2023-05-22 – 2023-05-30 (×28): 500 mg via ORAL
  Filled 2023-05-22 (×30): qty 1

## 2023-05-22 MED ORDER — HYDROMORPHONE HCL 1 MG/ML IJ SOLN
2.0000 mg | INTRAMUSCULAR | Status: DC | PRN
  Administered 2023-05-22 – 2023-05-28 (×8): 2 mg via INTRAVENOUS
  Filled 2023-05-22 (×8): qty 2

## 2023-05-22 MED ORDER — MORPHINE SULFATE ER 30 MG PO TBCR
30.0000 mg | EXTENDED_RELEASE_TABLET | Freq: Three times a day (TID) | ORAL | Status: DC
  Administered 2023-05-22 – 2023-05-30 (×24): 30 mg via ORAL
  Filled 2023-05-22 (×25): qty 1

## 2023-05-22 MED ORDER — CEFAZOLIN SODIUM-DEXTROSE 2-4 GM/100ML-% IV SOLN
2.0000 g | Freq: Three times a day (TID) | INTRAVENOUS | Status: DC
  Administered 2023-05-22 – 2023-05-30 (×24): 2 g via INTRAVENOUS
  Filled 2023-05-22 (×28): qty 100

## 2023-05-22 NOTE — Progress Notes (Signed)
[name]   DOB:1961/04/30   LK#:[id number]    ASSESSMENT & PLAN:  Breast cancer, stage 4, right (HCC) Her chest wall mass is worse I do not believe she is responding to Xeloda even though we just started her on low-dose recently  However, given her chest wall infection, I will hold her Xeloda while hospitalized   wound healing, delayed She was seen by wound care center who recommended advanced home care for follow-up The patient is not capable of performing dressing changes herself Unfortunately, the patient has told advanced home care nurse not to return At this point in time, after long conversation, she is willing to consider being admitted to skilled nursing facility for assistance Wound culture came back positive for gram-positive cocci and she appears to be responding well to IV antibiotics Appreciate wound care consult as well as dressing changes while she is in the hospital    cancer associated pain We have extensive discussion about pain control The patient repeatedly stated that her pain is not well-controlled I have counted her pills multiple times in the outpatient clinic and felt that the patient is not taking her medication as directed Appreciate palliative care team to assist in pain management She will continue MS Contin 3 times a day, Dilaudid as needed and palliative care has added gabapentin as well   Severe anemia Likely due to bleeding and anemia of chronic illness Agree with blood transfusion Blood count is much improved   Weight loss, non-intentional She has progressive weight loss Previously I have prescribed Remeron but the patient has not been taking it She has not been eating well and not been taking her nutritional supplement Will get dietitian to see   Non compliance with medical treatment She has been noncompliant with medications and prescribed treatment She is not capable of following instruction Her mother tried to help with pill organization  but the patient has declined this repeatedly for her mother to get involved She has declined home physical therapy She have declined for home occupational therapy She has not been taking her medications as prescribed and overall, I do not feel that the patient is capable of taking care of herself We will consult social worker to look for skilled nursing facility for admission; unfortunately, outpatient social worker has exhausted all options The patient need to be placed in skilled nursing facility upon discharge   Goals of care, counseling/discussion I have numerous goals of care discussion with the patient and family in the past The patient is declining due to inability to take care of herself and inability to take medications and treatment as prescribed I felt that she is on rapid decline and I do not believe she is going to survive this cancer without additional help The patient does not want to stop treatment   Discharge planning unknown  All questions were answered. The patient knows to call the clinic with any problems, questions or concerns.   The total time spent in the appointment was 30 minutes encounter with patients including review of chart and various tests results, discussions about plan of care and coordination of care plan  Artis Delay, MD 05/22/2023 2:03 PM  Subjective:  She is doing better.  She is eating lunch when I entered the room.  Her blood count is improved.  Her pain control is suboptimal and she still having a lot of pain  Objective:  Vitals:   05/22/23 0849 05/22/23 1130  BP: 102/66 109/69  Pulse: (!) 101 (!)  102  Resp: 20 16  Temp: 98.1 F (36.7 C) 98 F (36.7 C)  SpO2: 96% 96%     Intake/Output Summary (Last 24 hours) at 05/22/2023 1403 Last data filed at 05/22/2023 0842 Gross per 24 hour  Intake 2473.04 ml  Output --  Net 2473.04 ml    GENERAL:alert, no distress and comfortable NEURO: alert & oriented x 3 with fluent speech, no focal  motor/sensory deficits   Labs:  Recent Labs    04/17/23 1338 05/08/23 1510 05/19/23 1810 05/20/23 1012 05/21/23 0420 05/22/23 1052  NA 140 138 135 134* 135 138  K 3.8 4.3 3.7 3.4* 3.4* 3.3*  CL 104 102 99 101 107 110  CO2 30 29 24 25 22  21*  GLUCOSE 106* 128* 141* 135* 99 113*  BUN 12 19 20 17 13 12   CREATININE 0.89 1.06* 0.97 0.86 0.79 0.66  CALCIUM 9.2 8.6* 8.3* 7.4* 7.1* 7.0*  GFRNONAA >60 59* >60 >60 >60 >60  PROT 6.9 6.4* 6.6  --   --   --   ALBUMIN 3.5 3.2* 2.7*  --   --   --   AST 41 33 43*  --   --   --   ALT 9 6 12   --   --   --   ALKPHOS 57 64 73  --   --   --   BILITOT 0.5 0.3 0.6  --   --   --     Studies:  VAS [place] LOWER EXTREMITY VENOUS (DVT)  Result Date: 05/20/2023  Lower Venous DVT Study Patient Name:  SHANLEE TAUSSIG  Date of Exam:   05/20/2023 Medical Rec #: [id number]        Accession #:    [id number] Date of Birth: 10/29/1960         Patient Gender: F Patient Age:   17 years Exam Location:  [facility] Procedure:      VAS [place] LOWER EXTREMITY VENOUS (DVT) Referring Phys: Jon Billings REGALADO --------------------------------------------------------------------------------  Indications: Edema.  Risk Factors: Cancer. Comparison Study: No prior studies. Performing Technologist: Chanda Busing RVT  Examination Guidelines: A complete evaluation includes B-mode imaging, spectral Doppler, color Doppler, and power Doppler as needed of all accessible portions of each vessel. Bilateral testing is considered an integral part of a complete examination. Limited examinations for reoccurring indications may be performed as noted. The reflux portion of the exam is performed with the patient in reverse Trendelenburg.  +---------+---------------+---------+-----------+----------+--------------+ RIGHT    CompressibilityPhasicitySpontaneityPropertiesThrombus Aging +---------+---------------+---------+-----------+----------+--------------+ CFV      Full           Yes      Yes                                  +---------+---------------+---------+-----------+----------+--------------+ SFJ      Full                                                        +---------+---------------+---------+-----------+----------+--------------+ FV Prox  Full                                                        +---------+---------------+---------+-----------+----------+--------------+  FV Mid   Full                                                        +---------+---------------+---------+-----------+----------+--------------+ FV DistalFull                                                        +---------+---------------+---------+-----------+----------+--------------+ PFV      Full                                                        +---------+---------------+---------+-----------+----------+--------------+ POP      Full           Yes      Yes                                 +---------+---------------+---------+-----------+----------+--------------+ PTV      Full                                                        +---------+---------------+---------+-----------+----------+--------------+ PERO     Full                                                        +---------+---------------+---------+-----------+----------+--------------+   +---------+---------------+---------+-----------+----------+--------------+ LEFT     CompressibilityPhasicitySpontaneityPropertiesThrombus Aging +---------+---------------+---------+-----------+----------+--------------+ CFV      Full           Yes      Yes                                 +---------+---------------+---------+-----------+----------+--------------+ SFJ      Full                                                        +---------+---------------+---------+-----------+----------+--------------+ FV Prox  Full                                                         +---------+---------------+---------+-----------+----------+--------------+ FV Mid   Full                                                        +---------+---------------+---------+-----------+----------+--------------+  FV DistalFull                                                        +---------+---------------+---------+-----------+----------+--------------+ PFV      Full                                                        +---------+---------------+---------+-----------+----------+--------------+ POP      Full           Yes      Yes                                 +---------+---------------+---------+-----------+----------+--------------+ PTV      Full                                                        +---------+---------------+---------+-----------+----------+--------------+ PERO     Full                                                        +---------+---------------+---------+-----------+----------+--------------+     Summary: RIGHT: - There is no evidence of deep vein thrombosis in the lower extremity.  - No cystic structure found in the popliteal fossa.  LEFT: - There is no evidence of deep vein thrombosis in the lower extremity.  - No cystic structure found in the popliteal fossa.  *See table(s) above for measurements and observations. Electronically signed by Heath Lark on 05/20/2023 at 8:50:49 PM.    Final    DG Chest Port 1 View  Result Date: 05/19/2023 CLINICAL DATA:  Chest pain and palpitations EXAM: PORTABLE CHEST 1 VIEW COMPARISON:  Radiographs 08/22/2022 and CT chest 03/16/2023 FINDINGS: Stable cardiomediastinal silhouette. The right hilar fullness is likely at least in part due to the right chest wall lesions better seen on CT 03/16/2023. Right perihilar and lower lobe consolidation. Small right pleural effusion. The left lung is clear. No pneumothorax. IMPRESSION: 1. Right perihilar and lower lobe atelectasis or pneumonia with small right  pleural effusion. 2. Right hilar fullness is in at least part due to the right chest wall lesions better seen on CT 03/16/2023. Electronically Signed   By: Minerva Fester M.D.   On: 05/19/2023 19:17

## 2023-05-22 NOTE — Progress Notes (Signed)
Daily Progress Note   Patient Name: [name]       Date: 05/22/2023 DOB: Jun 30, 1961  Age: 62 y.o. MRN#: [id number] Attending Physician: Alba Cory, MD Primary Care Physician: Artis Delay, MD Admit Date: 05/19/2023  Reason for Consultation/Follow-up: Establishing goals of care, Non pain symptom management, and Pain control   Subjective:  Awake alert, states that she did not know that her Hgb went so low. Patient's mother also on the phone. Ongoing challenges with adequate pain management - see below.   Length of Stay: 2  Current Medications: Scheduled Meds:   acetaminophen  500 mg Oral Q6H WA   feeding supplement  1 Container Oral BID BM   feeding supplement  237 mL Oral BID BM   gabapentin  100 mg Oral TID   metroNIDAZOLE   Topical Daily   morphine  30 mg Oral Q8H   nutrition supplement (JUVEN)  1 packet Oral BID BM   polyethylene glycol  17 g Oral BID   senna  2 tablet Oral BID   sodium chloride flush  3 mL Intravenous Q12H    Continuous Infusions:   ceFAZolin (ANCEF) IV 2 g (05/22/23 1219)   metronidazole 500 mg (05/22/23 1016)    PRN Meds: acetaminophen **OR** acetaminophen, bisacodyl, diphenhydrAMINE, HYDROmorphone (DILAUDID) injection, ondansetron **OR** ondansetron (ZOFRAN) IV  Physical Exam         Dressing on chest wall not malodorous today Regular work of breathing Awake alert No distress No edema Thin and cancer related cachexia evident  Vital Signs: BP 109/69 (BP Location: Left Arm)   Pulse (!) 102   Temp 98 F (36.7 C) (Oral)   Resp 16   Ht 5\' 6"  (1.676 m)   Wt 55 kg   SpO2 96%   BMI 19.57 kg/m  SpO2: SpO2: 96 % O2 Device: O2 Device: Room Air O2 Flow Rate: O2 Flow Rate (L/min): 2 L/min  Intake/output summary:  Intake/Output Summary  (Last 24 hours) at 05/22/2023 1324 Last data filed at 05/22/2023 0842 Gross per 24 hour  Intake 2473.04 ml  Output --  Net 2473.04 ml   LBM: Last BM Date : 05/20/23 Baseline Weight: Weight: 55 kg Most recent weight: Weight: 55 kg       Palliative Assessment/Data:      Patient Active Problem List  Diagnosis Date Noted   Intractable pain 05/19/2023   Non compliance with medical treatment 05/08/2023   Anemia due to antineoplastic chemotherapy 04/07/2023   Weight loss, non-intentional 01/22/2023   MSSA (methicillin susceptible Staphylococcus aureus) infection 08/12/2022   Empyema, right (HCC) 08/11/2022   Need for management of chest tube 08/11/2022   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   Need for emotional support 08/05/2022   Constipation 08/05/2022   Palliative care encounter 08/05/2022   Protein-calorie malnutrition, severe 08/05/2022   Pleural effusion, right 08/04/2022   Pleural effusion on right 08/02/2022   Protein calorie malnutrition (HCC) 08/02/2022   Goals of care, counseling/discussion 07/25/2022   Vitamin D deficiency 06/12/2022   Situational depression 05/30/2022   GERD (gastroesophageal reflux disease) 05/30/2022   Pancytopenia, acquired (HCC) 05/08/2022   Wound healing, delayed 05/08/2022   Physical deconditioning 04/17/2022   Poor social situation 04/02/2022   Cancer associated pain 03/31/2022   Poor venous access 03/31/2022   Other constipation 03/31/2022   Breast cancer metastasized to bone, right (HCC) 03/23/2022   Breast cancer, stage 4, right (HCC) 03/22/2022   Pleural effusion 03/22/2022   Cellulitis 03/22/2022   Microcytic anemia 03/22/2022   Thrombocytosis 03/22/2022   Lactic acidosis 03/22/2022   Essential hypertension 01/27/2018   Screening for diabetes mellitus 01/27/2018   Screening for thyroid disorder 01/27/2018    Palliative Care Assessment & Plan   Patient Profile:    Assessment:  62 year old lady with stage IV right  breast cancer and cancer associated seated pain, ongoing weight loss and ongoing chest wall and back wounds. Patient follows with Dr. Bertis Ruddy from [facility] and is admitted to hospital medicine service for cancer associated pain and chest wall wounds which are not improving.  Recommendations/Plan: Patient history noted.  Change MS Contin to 30 mg every 8 hours.  Add scheduled Tylenol.  Continue IV Dilaudid as needed for now, has oral Dilaudid at home.  Continue MetroGel Topical.  Discussed with patient and mother about the use of gabapentin for neuropathic component to her pain and they are in agreement. Continue to monitor pain control Agree with SNF rehab with palliative on discharge     Code Status:    Code Status Orders  (From admission, onward)           Start     Ordered   05/19/23 1940  Do not attempt resuscitation (DNR)  Continuous       Question Answer Comment  If patient has no pulse and is not breathing Do Not Attempt Resuscitation   If patient has a pulse and/or is breathing: Medical Treatment Goals LIMITED ADDITIONAL INTERVENTIONS: Use medication/IV fluids and cardiac monitoring as indicated; Do not use intubation or mechanical ventilation (DNI), also provide comfort medications.  Transfer to Progressive/Stepdown as indicated, avoid Intensive Care.   Consent: Discussion documented in EHR or advanced directives reviewed      05/19/23 1940           Code Status History     Date Active Date Inactive Code Status Order ID Comments User Context   08/07/2022 0914 08/31/2022 0007 DNR [id number]  Artis Delay, MD Inpatient   08/02/2022 2231 08/07/2022 0914 Full Code [id number]  Briscoe Deutscher, MD ED   03/22/2022 2221 03/27/2022 2015 Full Code [id number]  John Giovanni, MD ED      Advance Directive Documentation    Flowsheet Row Most Recent Value  Type of Advance Directive Healthcare Power of [place], Living will  Pre-existing out  of facility DNR order  (yellow form or pink MOST form) --  "MOST" Form in Place? --       Prognosis:  Unable to determine  Discharge Planning: Skilled Nursing Facility for rehab with Palliative care service follow-up  Care plan was discussed with patient and her mother on phone.   Thank you for allowing the Palliative Medicine Team to assist in the care of this patient.  Mod MDM.     Greater than 50%  of this time was spent counseling and coordinating care related to the above assessment and plan.  Rosalin Hawking, MD  Please contact Palliative Medicine Team phone at [phone] for questions and concerns.

## 2023-05-22 NOTE — Progress Notes (Signed)
PROGRESS NOTE    [name]  FAO:[id number] DOB: 1960-11-05 DOA: 05/19/2023 PCP: Artis Delay, MD   Brief Narrative: 62 year old with past medical history significant for right breast cancer, with chronic right chest and back wounds, chronic cancer-related pain presents with uncontrolled pain at the site of her chronic wounds.  She has been taking MS Contin and oral Dilaudid at home as prescribed.  She has been noticed to have worsening drainage from her wounds.  She was also noted to be tachycardic with heart rates in the 140s.  Chest x-ray perihilar and lower lobe atelectasis versus pneumonia with a small right pleural effusion.   Assessment & Plan:   Principal Problem:   Intractable pain Active Problems:   Breast cancer, stage 4, right (HCC)   Thrombocytosis   Wound healing, delayed   1-Intractable cancer-related pain, chest;  Continue with MS Contin now change to TID> , changed frequency of IV Dilaudid to every 3 hours.  Palliative care has been consulted. Started on gabapentin.   2-Chronic chest wall wound, with active infection: Patient having worsening drainage from wound and foul-smelling. -Continue with  IV antibiotics, change to cefazolin and flagyl. Wound growing MSSA and Diphtheroids.   -Wound care consulted and following. Local care.   Breast cancer: Xeloda on hold.  Dr. Bertis Ruddy following.    Tachycardia; suspect related to hypovolemia and pain.  Denies pleuritic chest pain.   Dopplers of lower extremity negative.  Debility: In the setting of malignancy PT consulted Hypokalemia: Continue with oral supplement.   Anemia; of malignancy: monitor. Hb down to 6.9--5.4. Received 2 units PRBC 8/22. Hb today at 11.  Denies blood in the stool.   Estimated body mass index is 19.57 kg/m as calculated from the following:   Height as of this encounter: 5\' 6"  (1.676 m).   Weight as of this encounter: 55 kg.   DVT prophylaxis: Lovenox, continue for now. No gross  evidence of bleeding Code Status: DNR Family Communication: Care discussed with patient.  Disposition Plan:  Status is: Observation The patient will require care spanning > 2 midnights and should be moved to inpatient because: management of chest wound. Benefit from SNF    Consultants:  Oncology   Procedures:   Antimicrobials:    Subjective: She continue to have pain wound site.  Wound is draining less.  Denies bloody stool.   Objective: Vitals:   05/22/23 0625 05/22/23 0814 05/22/23 0849 05/22/23 1130  BP: (!) 101/59 101/64 102/66 109/69  Pulse: (!) 101 99 (!) 101 (!) 102  Resp: 20 18 20 16   Temp: 97.9 F (36.6 C) 97.6 F (36.4 C) 98.1 F (36.7 C) 98 F (36.7 C)  TempSrc: Oral Oral Oral Oral  SpO2: 95% 93% 96% 96%  Weight:      Height:        Intake/Output Summary (Last 24 hours) at 05/22/2023 1513 Last data filed at 05/22/2023 6962 Gross per 24 hour  Intake 2473.04 ml  Output --  Net 2473.04 ml   Filed Weights   05/19/23 1750  Weight: 55 kg    Examination:  General exam: NAD Respiratory system:CTA Cardiovascular system: S 1, S 2 RRR Chest wall: with large open wound cover all anterior right side chest, less purulent  drainage  Gastrointestinal system: BS present, soft nt Central nervous system: Alert, follows command    Data Reviewed: I have personally reviewed following labs and imaging studies  CBC: Recent Labs  Lab 05/19/23 1810 05/20/23 1012 05/21/23 0420 05/21/23  1946 05/22/23 1052  WBC 5.8 4.6 5.5  --  6.0  NEUTROABS 4.4  --   --   --   --   HGB 9.1* 7.5* 6.8* 5.4* 11.6*  HCT 29.0* 23.9* 21.9* 17.2* 36.1  MCV 86.6 86.3 86.6  --  88.9  PLT 544* 402* 394  --  351   Basic Metabolic Panel: Recent Labs  Lab 05/19/23 1810 05/20/23 1012 05/21/23 0420 05/22/23 1052  NA 135 134* 135 138  K 3.7 3.4* 3.4* 3.3*  CL 99 101 107 110  CO2 24 25 22  21*  GLUCOSE 141* 135* 99 113*  BUN 20 17 13 12   CREATININE 0.97 0.86 0.79 0.66  CALCIUM  8.3* 7.4* 7.1* 7.0*  MG  --  2.2  --   --    GFR: Estimated Creatinine Clearance: 62.5 mL/min (by C-G formula based on SCr of 0.66 mg/dL). Liver Function Tests: Recent Labs  Lab 05/19/23 1810  AST 43*  ALT 12  ALKPHOS 73  BILITOT 0.6  PROT 6.6  ALBUMIN 2.7*   No results for input(s): "LIPASE", "AMYLASE" in the last 168 hours. No results for input(s): "AMMONIA" in the last 168 hours. Coagulation Profile: No results for input(s): "INR", "PROTIME" in the last 168 hours. Cardiac Enzymes: No results for input(s): "CKTOTAL", "CKMB", "CKMBINDEX", "TROPONINI" in the last 168 hours. BNP (last 3 results) No results for input(s): "PROBNP" in the last 8760 hours. HbA1C: No results for input(s): "HGBA1C" in the last 72 hours. CBG: No results for input(s): "GLUCAP" in the last 168 hours. Lipid Profile: No results for input(s): "CHOL", "HDL", "LDLCALC", "TRIG", "CHOLHDL", "LDLDIRECT" in the last 72 hours. Thyroid Function Tests: No results for input(s): "TSH", "T4TOTAL", "FREET4", "T3FREE", "THYROIDAB" in the last 72 hours. Anemia Panel: No results for input(s): "VITAMINB12", "FOLATE", "FERRITIN", "TIBC", "IRON", "RETICCTPCT" in the last 72 hours. Sepsis Labs: No results for input(s): "PROCALCITON", "LATICACIDVEN" in the last 168 hours.  Recent Results (from the past 240 hour(s))  Aerobic Culture w Gram Stain (superficial specimen)     Status: None   Collection Time: 05/20/23 12:38 PM   Specimen: Breast; Wound  Result Value Ref Range Status   Specimen Description   Final    BREAST Performed at [facility], [address] [place], [place] [address]    Special Requests   Final    Immunocompromised Performed at [facility], [address], [place], [place] [address]    Gram Stain   Final    NO WBC SEEN RARE GRAM POSITIVE COCCI IN PAIRS RARE GRAM POSITIVE RODS    Culture   Final    ABUNDANT STAPHYLOCOCCUS AUREUS MODERATE  DIPHTHEROIDS(CORYNEBACTERIUM SPECIES) Standardized susceptibility testing for this organism is not available. Performed at [facility], [address] [place], [place] [address]    Report Status 05/22/2023 FINAL  Final   Organism ID, Bacteria STAPHYLOCOCCUS AUREUS  Final      Susceptibility   Staphylococcus aureus - MIC*    CIPROFLOXACIN 1 SENSITIVE Sensitive     ERYTHROMYCIN >=8 RESISTANT Resistant     GENTAMICIN <=0.5 SENSITIVE Sensitive     OXACILLIN 0.5 SENSITIVE Sensitive     TETRACYCLINE <=1 SENSITIVE Sensitive     VANCOMYCIN 1 SENSITIVE Sensitive     TRIMETH/SULFA <=10 SENSITIVE Sensitive     CLINDAMYCIN <=0.25 SENSITIVE Sensitive     RIFAMPIN <=0.5 SENSITIVE Sensitive     Inducible Clindamycin NEGATIVE Sensitive     LINEZOLID 2 SENSITIVE  Sensitive     * ABUNDANT STAPHYLOCOCCUS AUREUS         Radiology Studies: No results found.      Scheduled Meds:  acetaminophen  500 mg Oral Q6H WA   feeding supplement  1 Container Oral BID BM   feeding supplement  237 mL Oral BID BM   gabapentin  100 mg Oral TID   metroNIDAZOLE   Topical Daily   morphine  30 mg Oral Q8H   nutrition supplement (JUVEN)  1 packet Oral BID BM   polyethylene glycol  17 g Oral BID   senna  2 tablet Oral BID   sodium chloride flush  3 mL Intravenous Q12H   Continuous Infusions:   ceFAZolin (ANCEF) IV 2 g (05/22/23 1219)   metronidazole 500 mg (05/22/23 1016)     LOS: 2 days    Time spent: 35 minutes    Honi Name A Eara Burruel, MD [facility]   If 7PM-7AM, please contact night-coverage [email or web address]  05/22/2023, 3:13 PM

## 2023-05-22 NOTE — Plan of Care (Signed)
  Problem: Clinical Measurements: Goal: Respiratory complications will improve Outcome: Progressing   Problem: Coping: Goal: Level of anxiety will decrease Outcome: Progressing   Problem: Pain Managment: Goal: General experience of comfort will improve Outcome: Progressing   Problem: Safety: Goal: Ability to remain free from injury will improve Outcome: Progressing   

## 2023-05-22 NOTE — Progress Notes (Signed)
Pt currently has RBC running. AM nurse advised to call lab to draw AM lab 2H after completion.

## 2023-05-22 NOTE — TOC Progression Note (Addendum)
Transition of Care [facility]) - Progression Note    Patient Details  Name: [name] MRN: [id number] Date of Birth: 1960-11-16  Transition of Care [facility]) CM/SW Contact  Adrian Prows, RN Phone Number: 05/22/2023, 12:13 PM  Clinical Narrative:    Pt given bed offer; she requests this RNCM return this pm with list of bed offers; will follow up w/ pt; awaiting bed choice; ins auth  -1248- pt says she would like to be considered for bed at [facility]; LVM for Lawerance Cruel, Admissions coordinator at facility; awaiting return call  -1308- call back from [facility]; she says SNF hub has been updated and facility can not make bed offer.  -1313- spoke w/ [place] at [facility] and asked for her to review case for possible bed offer; awaiting return call  -1406- LVM for Marcelino Duster, Admission Director at [facility] to request review of case for possible offer; awaiting return call  -1410- spoke w/ Meryle Ready at [facility], and she will review case; awaiting response  -1413- LVM Eudelia Bunch, Admission Director at [facility] to request review of case for possible offer; awaiting return call  - 1414- LVM for Bea Laura, Director of at [place] to request review of case for possible offer; awaiting return call  -1417- spoke w/ Neysa Bonito in admissions at [facility]; she will review case and call this CM back; awaiting callback and decision  -1423- LVM for [place]iffany admissions/ at [facility] to request review of case for possible offer; awaiting return call  -1456- call back from [place] at [facility]; she will review chart; awaiting decision and callback  -1620- additional declinations received in SNF hub: [address], [place], [place], and [place]  -1630- spoke w/ pt in room; she was updated on bed offers; pt says she will go to [facility]; facility notified via SNF hub; also LVM for [place], Admissions Coordinator at facility; ins auth needed.  Expected Discharge Plan:  Skilled Nursing Facility Barriers to Discharge: Continued Medical Work up  Expected Discharge Plan and Services In-house Referral: Clinical Social Work     Living arrangements for the past 2 months: Apartment                                       Social Determinants of Health (SDOH) Interventions SDOH Screenings   Food Insecurity: No Food Insecurity (05/20/2023)  Housing: Low Risk  (05/20/2023)  Transportation Needs: No Transportation Needs (05/20/2023)  Utilities: Not At Risk (05/20/2023)  Financial Resource Strain: Low Risk  (04/03/2022)  Tobacco Use: Low Risk  (05/19/2023)    Readmission Risk Interventions    08/30/2022    1:46 PM 08/06/2022   10:01 AM  Readmission Risk Prevention Plan  Transportation Screening Complete   PCP or Specialist Appt within 3-5 Days Complete   HRI or Home Care Consult Complete   Social Work Consult for Recovery Care Planning/Counseling Complete   Palliative Care Screening Not Applicable   Medication Review [employer]) Complete Complete  PCP or Specialist appointment within 3-5 days of discharge  Complete  HRI or Home Care Consult  Complete  SW Recovery Care/Counseling Consult  Complete  Palliative Care Screening  Not Applicable  Skilled Nursing Facility  Complete

## 2023-05-23 DIAGNOSIS — R52 Pain, unspecified: Secondary | ICD-10-CM | POA: Diagnosis not present

## 2023-05-23 LAB — BASIC METABOLIC PANEL
Anion gap: 7 (ref 5–15)
BUN: 13 mg/dL (ref 8–23)
CO2: 21 mmol/L — ABNORMAL LOW (ref 22–32)
Calcium: 7 mg/dL — ABNORMAL LOW (ref 8.9–10.3)
Chloride: 110 mmol/L (ref 98–111)
Creatinine, Ser: 0.7 mg/dL (ref 0.44–1.00)
GFR, Estimated: >60 mL/min (ref >60)
Glucose, Bld: 89 mg/dL (ref 70–99)
Potassium: 3.8 mmol/L (ref 3.5–5.1)
Sodium: 138 mmol/L (ref 135–145)

## 2023-05-23 LAB — CBC
HCT: 35.9 % — ABNORMAL LOW (ref 36.0–46.0)
Hemoglobin: 11.6 g/dL — ABNORMAL LOW (ref 12.0–15.0)
MCH: 28.6 pg (ref 26.0–34.0)
MCHC: 32.3 g/dL (ref 30.0–36.0)
MCV: 88.6 fL (ref 80.0–100.0)
Platelets: 343 10*3/uL (ref 150–400)
RBC: 4.05 MIL/uL (ref 3.87–5.11)
RDW: 16.5 % — ABNORMAL HIGH (ref 11.5–15.5)
WBC: 5.3 10*3/uL (ref 4.0–10.5)
nRBC: 0 % (ref 0.0–0.2)

## 2023-05-23 MED ORDER — ENOXAPARIN SODIUM 40 MG/0.4ML IJ SOSY
40.0000 mg | PREFILLED_SYRINGE | Freq: Every day | INTRAMUSCULAR | Status: DC
  Administered 2023-05-23 – 2023-05-30 (×7): 40 mg via SUBCUTANEOUS
  Filled 2023-05-23 (×7): qty 0.4

## 2023-05-23 NOTE — Progress Notes (Signed)
PROGRESS NOTE    [name]  ZOX:[id number] DOB: 1961-05-13 DOA: 05/19/2023 PCP: Artis Delay, MD   Brief Narrative: 61 year old with past medical history significant for right breast cancer, with chronic right chest and back wounds, chronic cancer-related pain presents with uncontrolled pain at the site of her chronic wounds.  She has been taking MS Contin and oral Dilaudid at home as prescribed.  She has been noticed to have worsening drainage from her wounds.  She was also noted to be tachycardic with heart rates in the 140s.  Chest x-ray perihilar and lower lobe atelectasis versus pneumonia with a small right pleural effusion.   Assessment & Plan:   Principal Problem:   Intractable pain Active Problems:   Breast cancer, stage 4, right (HCC)   Thrombocytosis   Wound healing, delayed   1-Intractable cancer-related pain, chest;  Continue with MS Contin now change to TID> , changed frequency of IV Dilaudid to every 3 hours.   Appreciate palliative care with management of pain.  Started on gabapentin.   2-Chronic chest wall wound, with active infection: Patient having worsening drainage from wound and foul-smelling. -Continue with  IV antibiotics, change to cefazolin and flagyl. Wound growing MSSA and Diphtheroids.   -Wound care consulted and following. Local care.  Pain management.   Breast cancer: Xeloda on hold.  Dr. Bertis Ruddy following.    Tachycardia; suspect related to hypovolemia and pain.  Denies pleuritic chest pain.   Dopplers of lower extremity negative. Resolved. Discontinue telemetry.   Debility: In the setting of malignancy PT consulted Hypokalemia: Continue with oral supplement.   Anemia; of malignancy: monitor. Hb down to 6.9--5.4. Received 2 units PRBC 8/22. Hb today at 11.  Denies blood in the stool.   Estimated body mass index is 19.57 kg/m as calculated from the following:   Height as of this encounter: 5\' 6"  (1.676 m).   Weight as of this encounter:  55 kg.   DVT prophylaxis: Lovenox, continue for now. No gross evidence of bleeding Code Status: DNR Family Communication: Care discussed with patient.  Disposition Plan:  Status is: Observation The patient will require care spanning > 2 midnights and should be moved to inpatient because: management of chest wound. Benefit from SNF    Consultants:  Oncology   Procedures:   Antimicrobials:    Subjective: She is doing ok, still having pain at site of wound. Draining less.   Objective: Vitals:   05/22/23 1559 05/22/23 2119 05/23/23 0456 05/23/23 1228  BP: 101/68 97/71 95/64  115/71  Pulse: (!) 103  89 96  Resp:  15 15 16   Temp: 98.3 F (36.8 C) 97.8 F (36.6 C) 98 F (36.7 C) 97.7 F (36.5 C)  TempSrc: Oral Oral Oral Oral  SpO2: 97% 96% 99% 99%  Weight:      Height:        Intake/Output Summary (Last 24 hours) at 05/23/2023 1413 Last data filed at 05/23/2023 0947 Gross per 24 hour  Intake 503 ml  Output --  Net 503 ml   Filed Weights   05/19/23 1750  Weight: 55 kg    Examination:  General exam: NAD Respiratory system: CTA Cardiovascular system: S 1, S 2 RRR Chest wall: with large open wound cover all anterior right side chest cover with clean dressing.  Gastrointestinal system: BS present, soft,    Data Reviewed: I have personally reviewed following labs and imaging studies  CBC: Recent Labs  Lab 05/19/23 1810 05/20/23 1012 05/21/23 0420 05/21/23 1946  05/22/23 1052 05/23/23 0436  WBC 5.8 4.6 5.5  --  6.0 5.3  NEUTROABS 4.4  --   --   --   --   --   HGB 9.1* 7.5* 6.8* 5.4* 11.6* 11.6*  HCT 29.0* 23.9* 21.9* 17.2* 36.1 35.9*  MCV 86.6 86.3 86.6  --  88.9 88.6  PLT 544* 402* 394  --  351 343   Basic Metabolic Panel: Recent Labs  Lab 05/19/23 1810 05/20/23 1012 05/21/23 0420 05/22/23 1052 05/23/23 0436  NA 135 134* 135 138 138  K 3.7 3.4* 3.4* 3.3* 3.8  CL 99 101 107 110 110  CO2 24 25 22  21* 21*  GLUCOSE 141* 135* 99 113* 89  BUN 20 17  13 12 13   CREATININE 0.97 0.86 0.79 0.66 0.70  CALCIUM 8.3* 7.4* 7.1* 7.0* 7.0*  MG  --  2.2  --   --   --    GFR: Estimated Creatinine Clearance: 62.5 mL/min (by C-G formula based on SCr of 0.7 mg/dL). Liver Function Tests: Recent Labs  Lab 05/19/23 1810  AST 43*  ALT 12  ALKPHOS 73  BILITOT 0.6  PROT 6.6  ALBUMIN 2.7*   No results for input(s): "LIPASE", "AMYLASE" in the last 168 hours. No results for input(s): "AMMONIA" in the last 168 hours. Coagulation Profile: No results for input(s): "INR", "PROTIME" in the last 168 hours. Cardiac Enzymes: No results for input(s): "CKTOTAL", "CKMB", "CKMBINDEX", "TROPONINI" in the last 168 hours. BNP (last 3 results) No results for input(s): "PROBNP" in the last 8760 hours. HbA1C: No results for input(s): "HGBA1C" in the last 72 hours. CBG: No results for input(s): "GLUCAP" in the last 168 hours. Lipid Profile: No results for input(s): "CHOL", "HDL", "LDLCALC", "TRIG", "CHOLHDL", "LDLDIRECT" in the last 72 hours. Thyroid Function Tests: No results for input(s): "TSH", "T4TOTAL", "FREET4", "T3FREE", "THYROIDAB" in the last 72 hours. Anemia Panel: No results for input(s): "VITAMINB12", "FOLATE", "FERRITIN", "TIBC", "IRON", "RETICCTPCT" in the last 72 hours. Sepsis Labs: No results for input(s): "PROCALCITON", "LATICACIDVEN" in the last 168 hours.  Recent Results (from the past 240 hour(s))  Aerobic Culture w Gram Stain (superficial specimen)     Status: None   Collection Time: 05/20/23 12:38 PM   Specimen: Breast; Wound  Result Value Ref Range Status   Specimen Description   Final    BREAST Performed at [facility], [address] [place], [place] [address]    Special Requests   Final    Immunocompromised Performed at [facility], [address], [place], [place] [address]    Gram Stain   Final    NO WBC SEEN RARE GRAM POSITIVE COCCI IN PAIRS RARE GRAM POSITIVE RODS    Culture    Final    ABUNDANT STAPHYLOCOCCUS AUREUS MODERATE DIPHTHEROIDS(CORYNEBACTERIUM SPECIES) Standardized susceptibility testing for this organism is not available. Performed at [facility], [address] [place], [place] [address]    Report Status 05/22/2023 FINAL  Final   Organism ID, Bacteria STAPHYLOCOCCUS AUREUS  Final      Susceptibility   Staphylococcus aureus - MIC*    CIPROFLOXACIN 1 SENSITIVE Sensitive     ERYTHROMYCIN >=8 RESISTANT Resistant     GENTAMICIN <=0.5 SENSITIVE Sensitive     OXACILLIN 0.5 SENSITIVE Sensitive     TETRACYCLINE <=1 SENSITIVE Sensitive     VANCOMYCIN 1 SENSITIVE Sensitive     TRIMETH/SULFA <=10 SENSITIVE Sensitive     CLINDAMYCIN <=0.25 SENSITIVE Sensitive  RIFAMPIN <=0.5 SENSITIVE Sensitive     Inducible Clindamycin NEGATIVE Sensitive     LINEZOLID 2 SENSITIVE Sensitive     * ABUNDANT STAPHYLOCOCCUS AUREUS         Radiology Studies: No results found.      Scheduled Meds:  acetaminophen  500 mg Oral Q6H WA   enoxaparin (LOVENOX) injection  40 mg Subcutaneous Daily   feeding supplement  1 Container Oral BID BM   feeding supplement  237 mL Oral BID BM   gabapentin  100 mg Oral TID   metroNIDAZOLE   Topical Daily   morphine  30 mg Oral Q8H   nutrition supplement (JUVEN)  1 packet Oral BID BM   polyethylene glycol  17 g Oral BID   senna  2 tablet Oral BID   sodium chloride flush  3 mL Intravenous Q12H   Continuous Infusions:   ceFAZolin (ANCEF) IV 2 g (05/23/23 1412)   metronidazole 500 mg (05/23/23 0959)     LOS: 3 days    Time spent: 35 minutes    Lazarius Rivkin A Josefa Syracuse, MD [facility]   If 7PM-7AM, please contact night-coverage [email or web address]  05/23/2023, 2:13 PM

## 2023-05-23 NOTE — Progress Notes (Signed)
Per [facility] at [facility] they do not start AUTH on weekends. TOC will follow up on Monday.

## 2023-05-23 NOTE — Progress Notes (Signed)
Daily Progress Note   Patient Name: [name]       Date: 05/23/2023 DOB: May 16, 1961  Age: 62 y.o. MRN#: [id number] Attending Physician: Alba Cory, MD Primary Care Physician: Artis Delay, MD Admit Date: 05/19/2023  Reason for Consultation/Follow-up: Establishing goals of care, Non pain symptom management, and Pain control   Subjective:  Awake alert, sitting in chair, pain control is "about the same"    Length of Stay: 3  Current Medications: Scheduled Meds:   acetaminophen  500 mg Oral Q6H WA   enoxaparin (LOVENOX) injection  40 mg Subcutaneous Daily   feeding supplement  1 Container Oral BID BM   feeding supplement  237 mL Oral BID BM   gabapentin  100 mg Oral TID   metroNIDAZOLE   Topical Daily   morphine  30 mg Oral Q8H   nutrition supplement (JUVEN)  1 packet Oral BID BM   polyethylene glycol  17 g Oral BID   senna  2 tablet Oral BID   sodium chloride flush  3 mL Intravenous Q12H    Continuous Infusions:   ceFAZolin (ANCEF) IV 2 g (05/23/23 0524)   metronidazole 500 mg (05/23/23 0959)    PRN Meds: acetaminophen **OR** acetaminophen, bisacodyl, diphenhydrAMINE, HYDROmorphone (DILAUDID) injection, HYDROmorphone, ondansetron **OR** ondansetron (ZOFRAN) IV  Physical Exam         Dressing on chest wall not malodorous   Regular work of breathing Awake alert No distress No edema Thin and cancer related cachexia evident Sitting in chair  Vital Signs: BP 95/64 (BP Location: Left Arm)   Pulse 89   Temp 98 F (36.7 C) (Oral)   Resp 15   Ht 5\' 6"  (1.676 m)   Wt 55 kg   SpO2 99%   BMI 19.57 kg/m  SpO2: SpO2: 99 % O2 Device: O2 Device: Room Air O2 Flow Rate: O2 Flow Rate (L/min): 2 L/min  Intake/output summary:  Intake/Output Summary (Last 24 hours) at  05/23/2023 1053 Last data filed at 05/23/2023 0947 Gross per 24 hour  Intake 503 ml  Output --  Net 503 ml   LBM: Last BM Date : 05/22/23 Baseline Weight: Weight: 55 kg Most recent weight: Weight: 55 kg       Palliative Assessment/Data:      Patient Active Problem List   Diagnosis  Date Noted   Intractable pain 05/19/2023   Non compliance with medical treatment 05/08/2023   Anemia due to antineoplastic chemotherapy 04/07/2023   Weight loss, non-intentional 01/22/2023   MSSA (methicillin susceptible Staphylococcus aureus) infection 08/12/2022   Empyema, right (HCC) 08/11/2022   Need for management of chest tube 08/11/2022   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   Need for emotional support 08/05/2022   Constipation 08/05/2022   Palliative care encounter 08/05/2022   Protein-calorie malnutrition, severe 08/05/2022   Pleural effusion, right 08/04/2022   Pleural effusion on right 08/02/2022   Protein calorie malnutrition (HCC) 08/02/2022   Goals of care, counseling/discussion 07/25/2022   Vitamin D deficiency 06/12/2022   Situational depression 05/30/2022   GERD (gastroesophageal reflux disease) 05/30/2022   Pancytopenia, acquired (HCC) 05/08/2022   Wound healing, delayed 05/08/2022   Physical deconditioning 04/17/2022   Poor social situation 04/02/2022   Cancer associated pain 03/31/2022   Poor venous access 03/31/2022   Other constipation 03/31/2022   Breast cancer metastasized to bone, right (HCC) 03/23/2022   Breast cancer, stage 4, right (HCC) 03/22/2022   Pleural effusion 03/22/2022   Cellulitis 03/22/2022   Microcytic anemia 03/22/2022   Thrombocytosis 03/22/2022   Lactic acidosis 03/22/2022   Essential hypertension 01/27/2018   Screening for diabetes mellitus 01/27/2018   Screening for thyroid disorder 01/27/2018    Palliative Care Assessment & Plan   Patient Profile:    Assessment:  62 year old lady with stage IV right breast cancer and cancer  associated seated pain, ongoing weight loss and ongoing chest wall and back wounds. Patient follows with Dr. Bertis Ruddy from [facility] and is admitted to hospital medicine service for cancer associated pain and chest wall wounds which are not improving.  Recommendations/Plan: Patient history noted. Continue Gabapentin   MS Contin to 30 mg every 8 hours.   scheduled Tylenol.  Continue IV Dilaudid as needed for now, has oral Dilaudid at home.  Continue MetroGel Topical.    Continue to monitor pain control Agree with SNF rehab with palliative on discharge     Code Status:    Code Status Orders  (From admission, onward)           Start     Ordered   05/19/23 1940  Do not attempt resuscitation (DNR)  Continuous       Question Answer Comment  If patient has no pulse and is not breathing Do Not Attempt Resuscitation   If patient has a pulse and/or is breathing: Medical Treatment Goals LIMITED ADDITIONAL INTERVENTIONS: Use medication/IV fluids and cardiac monitoring as indicated; Do not use intubation or mechanical ventilation (DNI), also provide comfort medications.  Transfer to Progressive/Stepdown as indicated, avoid Intensive Care.   Consent: Discussion documented in EHR or advanced directives reviewed      05/19/23 1940           Code Status History     Date Active Date Inactive Code Status Order ID Comments User Context   08/07/2022 0914 08/31/2022 0007 DNR [id number]  Artis Delay, MD Inpatient   08/02/2022 2231 08/07/2022 0914 Full Code [id number]  Briscoe Deutscher, MD ED   03/22/2022 2221 03/27/2022 2015 Full Code [id number]  John Giovanni, MD ED      Advance Directive Documentation    Flowsheet Row Most Recent Value  Type of Advance Directive Healthcare Power of Attorney, Living will  Pre-existing out of facility DNR order (yellow form or pink MOST form) --  "MOST" Form in Place? --  Prognosis:  Unable to determine  Discharge Planning: Skilled  Nursing Facility for rehab with Palliative care service follow-up  Care plan was discussed with patient    Thank you for allowing the Palliative Medicine Team to assist in the care of this patient.  Mod MDM.     Greater than 50%  of this time was spent counseling and coordinating care related to the above assessment and plan.  Rosalin Hawking, MD  Please contact Palliative Medicine Team phone at [phone] for questions and concerns.

## 2023-05-23 NOTE — Plan of Care (Signed)
  Problem: Education: Goal: Knowledge of General Education information will improve Description: Including pain rating scale, medication(s)/side effects and non-pharmacologic comfort measures Outcome: Progressing   Problem: Health Behavior/Discharge Planning: Goal: Ability to manage health-related needs will improve Outcome: Progressing   Problem: Clinical Measurements: Goal: Ability to maintain clinical measurements within normal limits will improve Outcome: Progressing Goal: Diagnostic test results will improve Outcome: Progressing   Problem: Activity: Goal: Risk for activity intolerance will decrease Outcome: Progressing   Problem: Nutrition: Goal: Adequate nutrition will be maintained Outcome: Progressing   Problem: Coping: Goal: Level of anxiety will decrease Outcome: Progressing   Problem: Elimination: Goal: Will not experience complications related to bowel motility Outcome: Progressing Goal: Will not experience complications related to urinary retention Outcome: Progressing   Problem: Pain Managment: Goal: General experience of comfort will improve Outcome: Progressing   Problem: Safety: Goal: Ability to remain free from injury will improve Outcome: Progressing

## 2023-05-23 NOTE — Progress Notes (Signed)
Pt assisted back to bed from recliner. Dressing change completed. Pt tolerated fairly well. Pre-medicated prior to change; pt also having pain at time medication. PRN medication used for pain.  Gown changed. Pt left lying in bed with call light within reach. All pt's needs.

## 2023-05-23 NOTE — Progress Notes (Signed)
Mobility Specialist - Progress Note  Pre-mobility: 111 bpm HR,  During mobility: 120 bpm HR,  Post-mobility: 98 bpm HR,     05/23/23 0933  Mobility  Activity Ambulated with assistance in hallway  Level of Assistance Standby assist, set-up cues, supervision of patient - no hands on  Assistive Device Front wheel walker  Distance Ambulated (ft) 350 ft  Range of Motion/Exercises Active  Activity Response Tolerated well  Mobility Referral Yes  $Mobility charge 1 Mobility  Mobility Specialist Start Time (ACUTE ONLY) [id number]  Mobility Specialist Stop Time (ACUTE ONLY) 0933  Mobility Specialist Time Calculation (min) (ACUTE ONLY) 27 min   Pt was found in bed and agreeable to ambulate. No complaints with session. At EOS returned to recliner chair with all needs met. Call bell in reach and chair alarm on.  Billey Chang Mobility Specialist

## 2023-05-23 NOTE — Progress Notes (Signed)
Attempted to change dressing x 2 this shift. Pt has declined each time. Educated on the need to change dressing and not wait any longer due to saturation of dressing. Pt continued to decline both times. Will attempt to change dressing dressing again this evening. All supplies at bedside.

## 2023-05-23 NOTE — Plan of Care (Signed)
  Problem: Pain Managment: Goal: General experience of comfort will improve Outcome: Progressing   Problem: Safety: Goal: Ability to remain free from injury will improve Outcome: Progressing   

## 2023-05-24 DIAGNOSIS — R52 Pain, unspecified: Secondary | ICD-10-CM | POA: Diagnosis not present

## 2023-05-24 NOTE — Plan of Care (Signed)
°  Problem: Nutrition: °Goal: Adequate nutrition will be maintained °Outcome: Progressing °  °Problem: Coping: °Goal: Level of anxiety will decrease °Outcome: Progressing °  °Problem: Safety: °Goal: Ability to remain free from injury will improve °Outcome: Progressing °  °

## 2023-05-24 NOTE — Progress Notes (Signed)
Daily Progress Note   Patient Name: [name]       Date: 05/24/2023 DOB: September 04, 1961  Age: 62 y.o. MRN#: [id number] Attending Physician: Alba Cory, MD Primary Care Physician: Artis Delay, MD Admit Date: 05/19/2023  Reason for Consultation/Follow-up: Establishing goals of care, Non pain symptom management, and Pain control   Subjective:  Awake alert, sitting in chair, pain control is "about the same"  Medication history noted, she has been requiring less of IV Dilaudid PRN since the MS contin was changed to Q 8 hours and since PO scheduled Tylenol was started. Advised patient to ask for IV Dilaudid for severe breakthrough pain.   Length of Stay: 4  Current Medications: Scheduled Meds:   acetaminophen  500 mg Oral Q6H WA   enoxaparin (LOVENOX) injection  40 mg Subcutaneous Daily   feeding supplement  1 Container Oral BID BM   feeding supplement  237 mL Oral BID BM   gabapentin  100 mg Oral TID   metroNIDAZOLE   Topical Daily   morphine  30 mg Oral Q8H   nutrition supplement (JUVEN)  1 packet Oral BID BM   polyethylene glycol  17 g Oral BID   senna  2 tablet Oral BID   sodium chloride flush  3 mL Intravenous Q12H    Continuous Infusions:   ceFAZolin (ANCEF) IV 2 g (05/24/23 0557)   metronidazole 500 mg (05/24/23 0909)    PRN Meds: acetaminophen **OR** acetaminophen, bisacodyl, diphenhydrAMINE, HYDROmorphone (DILAUDID) injection, HYDROmorphone, ondansetron **OR** ondansetron (ZOFRAN) IV  Physical Exam         Dressing on chest wall not malodorous   Regular work of breathing Awake alert No distress No edema Thin and cancer related cachexia evident Sitting in chair  Vital Signs: BP 92/60 (BP Location: Left Arm)   Pulse 93   Temp 98.2 F (36.8 C) (Oral)   Resp  20   Ht 5\' 6"  (1.676 m)   Wt 55 kg   SpO2 97%   BMI 19.57 kg/m  SpO2: SpO2: 97 % O2 Device: O2 Device: Room Air O2 Flow Rate: O2 Flow Rate (L/min): 2 L/min  Intake/output summary:  Intake/Output Summary (Last 24 hours) at 05/24/2023 1248 Last data filed at 05/23/2023 2115 Gross per 24 hour  Intake 240 ml  Output --  Net 240  ml   LBM: Last BM Date : 05/22/23 Baseline Weight: Weight: 55 kg Most recent weight: Weight: 55 kg       Palliative Assessment/Data:      Patient Active Problem List   Diagnosis Date Noted   Intractable pain 05/19/2023   Non compliance with medical treatment 05/08/2023   Anemia due to antineoplastic chemotherapy 04/07/2023   Weight loss, non-intentional 01/22/2023   MSSA (methicillin susceptible Staphylococcus aureus) infection 08/12/2022   Empyema, right (HCC) 08/11/2022   Need for management of chest tube 08/11/2022   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   Need for emotional support 08/05/2022   Constipation 08/05/2022   Palliative care encounter 08/05/2022   Protein-calorie malnutrition, severe 08/05/2022   Pleural effusion, right 08/04/2022   Pleural effusion on right 08/02/2022   Protein calorie malnutrition (HCC) 08/02/2022   Goals of care, counseling/discussion 07/25/2022   Vitamin D deficiency 06/12/2022   Situational depression 05/30/2022   GERD (gastroesophageal reflux disease) 05/30/2022   Pancytopenia, acquired (HCC) 05/08/2022   Wound healing, delayed 05/08/2022   Physical deconditioning 04/17/2022   Poor social situation 04/02/2022   Cancer associated pain 03/31/2022   Poor venous access 03/31/2022   Other constipation 03/31/2022   Breast cancer metastasized to bone, right (HCC) 03/23/2022   Breast cancer, stage 4, right (HCC) 03/22/2022   Pleural effusion 03/22/2022   Cellulitis 03/22/2022   Microcytic anemia 03/22/2022   Thrombocytosis 03/22/2022   Lactic acidosis 03/22/2022   Essential hypertension 01/27/2018    Screening for diabetes mellitus 01/27/2018   Screening for thyroid disorder 01/27/2018    Palliative Care Assessment & Plan   Patient Profile:    Assessment:  62 year old lady with stage IV right breast cancer and cancer associated seated pain, ongoing weight loss and ongoing chest wall and back wounds. Patient follows with Dr. Bertis Ruddy from [facility] health cancer [facility] and is admitted to hospital medicine service for cancer associated pain and chest wall wounds which are not improving.  Recommendations/Plan: Patient history noted. Continue Gabapentin   MS Contin to 30 mg every 8 hours.   scheduled Tylenol.  Continue IV Dilaudid as needed for now, has oral Dilaudid at home.  Continue MetroGel Topical.    Continue to monitor pain control Agree with SNF rehab with palliative on discharge     Code Status:    Code Status Orders  (From admission, onward)           Start     Ordered   05/19/23 1940  Do not attempt resuscitation (DNR)  Continuous       Question Answer Comment  If patient has no pulse and is not breathing Do Not Attempt Resuscitation   If patient has a pulse and/or is breathing: Medical Treatment Goals LIMITED ADDITIONAL INTERVENTIONS: Use medication/IV fluids and cardiac monitoring as indicated; Do not use intubation or mechanical ventilation (DNI), also provide comfort medications.  Transfer to Progressive/Stepdown as indicated, avoid Intensive Care.   Consent: Discussion documented in EHR or advanced directives reviewed      05/19/23 1940           Code Status History     Date Active Date Inactive Code Status Order ID Comments User Context   08/07/2022 0914 08/31/2022 0007 DNR [id number]  Artis Delay, MD Inpatient   08/02/2022 2231 08/07/2022 0914 Full Code [id number]  Briscoe Deutscher, MD ED   03/22/2022 2221 03/27/2022 2015 Full Code [id number]  John Giovanni, MD ED      Advance Directive Documentation  Flowsheet Row Most Recent Value  Type of Advance  Directive Healthcare Power of Attorney, Living will  Pre-existing out of facility DNR order (yellow form or pink MOST form) --  "MOST" Form in Place? --       Prognosis:  Unable to determine  Discharge Planning: Skilled Nursing Facility for rehab with Palliative care service follow-up  Care plan was discussed with patient    Thank you for allowing the Palliative Medicine Team to assist in the care of this patient.  Mod MDM.     Greater than 50%  of this time was spent counseling and coordinating care related to the above assessment and plan.  Rosalin Hawking, MD  Please contact Palliative Medicine Team phone at [phone] for questions and concerns.

## 2023-05-24 NOTE — Progress Notes (Signed)
PROGRESS NOTE    [name]  ZDG:[id number] DOB: 04-09-1961 DOA: 05/19/2023 PCP: Artis Delay, MD   Brief Narrative: 62 year old with past medical history significant for right breast cancer, with chronic right chest and back wounds, chronic cancer-related pain presents with uncontrolled pain at the site of her chronic wounds.  She has been taking MS Contin and oral Dilaudid at home as prescribed.  She has been noticed to have worsening drainage from her wounds.  She was also noted to be tachycardic with heart rates in the 140s.  Chest x-ray perihilar and lower lobe atelectasis versus pneumonia with Burch small right pleural effusion.   Assessment & Plan:   Principal Problem:   Intractable pain Active Problems:   Breast cancer, stage 4, right (HCC)   Thrombocytosis   Wound healing, delayed   1-Intractable cancer-related pain, chest;  Continue with MS Contin now change to TID> , changed frequency of IV Dilaudid to every 3 hours.   Appreciate palliative care with management of pain.  Started on gabapentin.  Complaints of pain, still 9/10. Didn't ask for dilaudid yesterday.  2-Chronic chest wall wound, with active infection: Patient having worsening drainage from wound and foul-smelling. -Continue with  IV antibiotics, change to cefazolin and flagyl. Wound growing MSSA and Diphtheroids.   -Wound care consulted and following. Local care.  Pain management.   Breast cancer: Xeloda on hold.  Dr. Bertis Ruddy following.    Tachycardia; suspect related to hypovolemia and pain.  Denies pleuritic chest pain.   Dopplers of lower extremity negative. Resolved. Discontinue telemetry.   Debility: In the setting of malignancy PT consulted Hypokalemia: Continue with oral supplement.   Anemia; of malignancy: monitor. Hb down to 6.9--5.4. Received 2 units PRBC 8/22. Hb today at 11.  Denies blood in the stool.   Estimated body mass index is 19.57 kg/m as calculated from the following:   Height as  of this encounter: 5\' 6"  (1.676 m).   Weight as of this encounter: 55 kg.   DVT prophylaxis: Lovenox, continue for now. No gross evidence of bleeding Code Status: DNR Family Communication: Care discussed with patient.  Disposition Plan:  Status is: Observation The patient will require care spanning > 2 midnights and should be moved to inpatient because: management of chest wound. Benefit from SNF. SNF Monday if bed available.     Consultants:  Oncology   Procedures:   Antimicrobials:    Subjective: Still having pain, chest wound Objective: Vitals:   05/23/23 0456 05/23/23 1228 05/23/23 2110 05/24/23 0604  BP: 95/64 115/71 97/64 96/62   Pulse: 89 96 92 98  Resp: 15 16 16 18   Temp: 98 F (36.7 C) 97.7 F (36.5 C) 97.9 F (36.6 C) 97.8 F (36.6 C)  TempSrc: Oral Oral Oral Oral  SpO2: 99% 99% 96% 98%  Weight:      Height:        Intake/Output Summary (Last 24 hours) at 05/24/2023 0746 Last data filed at 05/23/2023 2115 Gross per 24 hour  Intake 243 ml  Output --  Net 243 ml   Filed Weights   05/19/23 1750  Weight: 55 kg    Examination:  General exam: NAD Respiratory system: CTA Cardiovascular system: S 1, S 2 RRR Chest wall: with large open wound cover all anterior right side chest cover with clean dressing.  Gastrointestinal system: BS present, soft, nt   Data Reviewed: I have personally reviewed following labs and imaging studies  CBC: Recent Labs  Lab 05/19/23 1810 05/20/23  1012 05/21/23 0420 05/21/23 1946 05/22/23 1052 05/23/23 0436  WBC 5.8 4.6 5.5  --  6.0 5.3  NEUTROABS 4.4  --   --   --   --   --   HGB 9.1* 7.5* 6.8* 5.4* 11.6* 11.6*  HCT 29.0* 23.9* 21.9* 17.2* 36.1 35.9*  MCV 86.6 86.3 86.6  --  88.9 88.6  PLT 544* 402* 394  --  351 343   Basic Metabolic Panel: Recent Labs  Lab 05/19/23 1810 05/20/23 1012 05/21/23 0420 05/22/23 1052 05/23/23 0436  NA 135 134* 135 138 138  K 3.7 3.4* 3.4* 3.3* 3.8  CL 99 101 107 110 110  CO2  24 25 22  21* 21*  GLUCOSE 141* 135* 99 113* 89  BUN 20 17 13 12 13   CREATININE 0.97 0.86 0.79 0.66 0.70  CALCIUM 8.3* 7.4* 7.1* 7.0* 7.0*  MG  --  2.2  --   --   --    GFR: Estimated Creatinine Clearance: 62.5 mL/min (by C-G formula based on SCr of 0.7 mg/dL). Liver Function Tests: Recent Labs  Lab 05/19/23 1810  AST 43*  ALT 12  ALKPHOS 73  BILITOT 0.6  PROT 6.6  ALBUMIN 2.7*   No results for input(s): "LIPASE", "AMYLASE" in the last 168 hours. No results for input(s): "AMMONIA" in the last 168 hours. Coagulation Profile: No results for input(s): "INR", "PROTIME" in the last 168 hours. Cardiac Enzymes: No results for input(s): "CKTOTAL", "CKMB", "CKMBINDEX", "TROPONINI" in the last 168 hours. BNP (last 3 results) No results for input(s): "PROBNP" in the last 8760 hours. HbA1C: No results for input(s): "HGBA1C" in the last 72 hours. CBG: No results for input(s): "GLUCAP" in the last 168 hours. Lipid Profile: No results for input(s): "CHOL", "HDL", "LDLCALC", "TRIG", "CHOLHDL", "LDLDIRECT" in the last 72 hours. Thyroid Function Tests: No results for input(s): "TSH", "T4TOTAL", "FREET4", "T3FREE", "THYROIDAB" in the last 72 hours. Anemia Panel: No results for input(s): "VITAMINB12", "FOLATE", "FERRITIN", "TIBC", "IRON", "RETICCTPCT" in the last 72 hours. Sepsis Labs: No results for input(s): "PROCALCITON", "LATICACIDVEN" in the last 168 hours.  Recent Results (from the past 240 hour(s))  Aerobic Culture w Gram Stain (superficial specimen)     Status: None   Collection Time: 05/20/23 12:38 PM   Specimen: Breast; Wound  Result Value Ref Range Status   Specimen Description   Final    BREAST Performed at [facility], [address], [place], [place] [address]    Special Requests   Final    Immunocompromised Performed at [facility], [address], [place], [place] [address]    Gram Stain   Final    NO WBC SEEN RARE GRAM  POSITIVE COCCI IN PAIRS RARE GRAM POSITIVE RODS    Culture   Final    ABUNDANT STAPHYLOCOCCUS AUREUS MODERATE DIPHTHEROIDS(CORYNEBACTERIUM SPECIES) Standardized susceptibility testing for this organism is not available. Performed at [facility], [address], [place], [place] [address]    Report Status 05/22/2023 FINAL  Final   Organism ID, Bacteria STAPHYLOCOCCUS AUREUS  Final      Susceptibility   Staphylococcus aureus - MIC*    CIPROFLOXACIN 1 SENSITIVE Sensitive     ERYTHROMYCIN >=8 RESISTANT Resistant     GENTAMICIN <=0.5 SENSITIVE Sensitive     OXACILLIN 0.5 SENSITIVE Sensitive     TETRACYCLINE <=1 SENSITIVE Sensitive     VANCOMYCIN 1 SENSITIVE Sensitive     TRIMETH/SULFA <=10 SENSITIVE Sensitive  CLINDAMYCIN <=0.25 SENSITIVE Sensitive     RIFAMPIN <=0.5 SENSITIVE Sensitive     Inducible Clindamycin NEGATIVE Sensitive     LINEZOLID 2 SENSITIVE Sensitive     * ABUNDANT STAPHYLOCOCCUS AUREUS         Radiology Studies: No results found.      Scheduled Meds:  acetaminophen  500 mg Oral Q6H WA   enoxaparin (LOVENOX) injection  40 mg Subcutaneous Daily   feeding supplement  1 Container Oral BID BM   feeding supplement  237 mL Oral BID BM   gabapentin  100 mg Oral TID   metroNIDAZOLE   Topical Daily   morphine  30 mg Oral Q8H   nutrition supplement (JUVEN)  1 packet Oral BID BM   polyethylene glycol  17 g Oral BID   senna  2 tablet Oral BID   sodium chloride flush  3 mL Intravenous Q12H   Continuous Infusions:   ceFAZolin (ANCEF) IV 2 g (05/24/23 0557)   metronidazole 500 mg (05/24/23 0007)     LOS: 4 days    Time spent: 35 minutes    [name] Wendy Muchow, MD [facility]   If 7PM-7AM, please contact night-coverage [email or web address]  05/24/2023, 7:46 AM

## 2023-05-25 ENCOUNTER — Ambulatory Visit (HOSPITAL_BASED_OUTPATIENT_CLINIC_OR_DEPARTMENT_OTHER): Payer: BC Managed Care – PPO | Admitting: General Surgery

## 2023-05-25 ENCOUNTER — Inpatient Hospital Stay (HOSPITAL_COMMUNITY): Payer: BC Managed Care – PPO

## 2023-05-25 ENCOUNTER — Telehealth: Payer: Self-pay

## 2023-05-25 DIAGNOSIS — R52 Pain, unspecified: Secondary | ICD-10-CM | POA: Diagnosis not present

## 2023-05-25 LAB — BPAM RBC
Blood Product Expiration Date: 202409142359
Blood Product Expiration Date: 202409162359
Blood Product Expiration Date: 202409162359
ISSUE DATE / TIME: 202408221404
ISSUE DATE / TIME: 202408222344
ISSUE DATE / TIME: 202408230605
Unit Type and Rh: 6200
Unit Type and Rh: 6200
Unit Type and Rh: 6200

## 2023-05-25 LAB — TYPE AND SCREEN
ABO/RH(D): AB POS
Antibody Screen: NEGATIVE
Unit division: 0
Unit division: 0
Unit division: 0

## 2023-05-25 MED ORDER — HYDROMORPHONE HCL 8 MG PO TABS
8.0000 mg | ORAL_TABLET | ORAL | 0 refills | Status: AC | PRN
Start: 2023-05-25 — End: ?

## 2023-05-25 MED ORDER — METRONIDAZOLE 0.75 % EX GEL: Freq: Every day | CUTANEOUS | 0 refills | Status: DC

## 2023-05-25 MED ORDER — GABAPENTIN 100 MG PO CAPS: 100.0000 mg | ORAL_CAPSULE | Freq: Three times a day (TID) | ORAL | 1 refills | Status: DC

## 2023-05-25 MED ORDER — METRONIDAZOLE 500 MG PO TABS: 500.0000 mg | ORAL_TABLET | Freq: Three times a day (TID) | ORAL | 0 refills | Status: DC

## 2023-05-25 MED ORDER — MORPHINE SULFATE ER 30 MG PO TBCR: 30.0000 mg | EXTENDED_RELEASE_TABLET | Freq: Three times a day (TID) | ORAL | 0 refills | Status: AC

## 2023-05-25 MED ORDER — ACETAMINOPHEN 500 MG PO TABS: 500.0000 mg | ORAL_TABLET | Freq: Four times a day (QID) | ORAL | 0 refills | Status: DC

## 2023-05-25 MED ORDER — POLYETHYLENE GLYCOL 3350 17 G PO PACK: 17.0000 g | PACK | Freq: Two times a day (BID) | ORAL | 0 refills | Status: DC

## 2023-05-25 MED ORDER — CEPHALEXIN 500 MG PO CAPS: 500.0000 mg | ORAL_CAPSULE | Freq: Four times a day (QID) | ORAL | 0 refills | Status: DC

## 2023-05-25 NOTE — Progress Notes (Signed)
Pt refused dressing change this AM. Educated pt and she is agreeable to Biomedical scientist change bed pads and reinforce dressing. Pt states that provider wants to look at wound this AM and she only wants to have wound handled one time this morning.

## 2023-05-25 NOTE — Progress Notes (Addendum)
Physical Therapy Treatment Patient Details Name: [name] MRN: [id number] DOB: 11/04/1960 Today's Date: 05/25/2023   History of Present Illness 62 year old with past medical history significant for right breast cancer, with chronic right chest and back wounds, chronic cancer-related pain presents with uncontrolled pain at the site of her chronic wounds on 05/19/23.  She has been taking MS Contin and oral Dilaudid at home as prescribed.  She has been noticed to have worsening drainage from her wounds.  She was also noted to be tachycardic with heart rates in the 140s.  Chest x-ray perihilar and lower lobe atelectasis versus pneumonia with a small right pleural effusion. PMH: HTN, ankle surgery, tachycardia.    PT Comments  Pt making good progress with mobility.  She is ambulating 800' with RW and supervision with steady gait.  Pt does live alone and has a flight of steps to her bathroom - she declined attempting stairs today but demonstrates ROM, strength, and mobility necessary from PT clinical judgment for stairs.  From PT perspective progressing to HHPT level; however, pt reports planning to go to SNF for wound care and nutrition.       If plan is discharge home, recommend the following: A little help with bathing/dressing/bathroom;Assistance with cooking/housework;Help with stairs or ramp for entrance   Can travel by private vehicle     Yes  Equipment Recommendations  Rolling walker (2 wheels)    Recommendations for Other Services       Precautions / Restrictions Precautions Precautions: Fall     Mobility  Bed Mobility Overal bed mobility: Needs Assistance Bed Mobility: Supine to Sit, Sit to Supine     Supine to sit: Modified independent (Device/Increase time) Sit to supine: Modified independent (Device/Increase time)   General bed mobility comments: HOB slightly elevated    Transfers Overall transfer level: Needs assistance Equipment used: None Transfers: Sit  to/from Stand Sit to Stand: Supervision           General transfer comment: Performed sit to stand x 3 safely    Ambulation/Gait Ambulation/Gait assistance: Supervision Gait Distance (Feet): 800 Feet Assistive device: Rolling walker (2 wheels) Gait Pattern/deviations: Step-through pattern Gait velocity: WFL     General Gait Details: Steady; no loss of balance; VSS   Stairs Stairs:  (Pt declined trying stairs - was able to take 20 marching steps without difficulty)           Wheelchair Mobility     Tilt Bed    Modified Rankin (Stroke Patients Only)       Balance   Sitting-balance support: No upper extremity supported Sitting balance-Leahy Scale: Good     Standing balance support: Bilateral upper extremity supported, No upper extremity supported Standing balance-Leahy Scale: Good Standing balance comment: RW to ambulate - steady; did take a few steps and turn in circle without RW                            Cognition Arousal: Alert Behavior During Therapy: WFL for tasks assessed/performed Overall Cognitive Status: Within Functional Limits for tasks assessed                                 General Comments: Pleasant, motivated, smiling/laughing        Exercises      General Comments General comments (skin integrity, edema, etc.): VSS - Discussed good progress with mobility, reports  wanting to go to SNF for wound care and nutrition. Educated on continuing to ambulate with staff   Did note pt with order for R LE U/S for which she declined and she also has discharge orders, so proceeded with PT treatment.     Pertinent Vitals/Pain Pain Assessment Pain Assessment: Faces Faces Pain Scale: Hurts a little bit Pain Location: R upper chest Pain Descriptors / Indicators: Discomfort Pain Intervention(s): Limited activity within patient's tolerance, Monitored during session    Home Living                          Prior  Function            PT Goals (current goals can now be found in the care plan section) Progress towards PT goals: Progressing toward goals    Frequency    Min 1X/week      PT Plan      Co-evaluation              AM-PAC PT "6 Clicks" Mobility   Outcome Measure  Help needed turning from your back to your side while in a flat bed without using bedrails?: None Help needed moving from lying on your back to sitting on the side of a flat bed without using bedrails?: None Help needed moving to and from a bed to a chair (including a wheelchair)?: A Little Help needed standing up from a chair using your arms (e.g., wheelchair or bedside chair)?: A Little Help needed to walk in hospital room?: A Little Help needed climbing 3-5 steps with a railing? : A Little 6 Click Score: 20    End of Session Equipment Utilized During Treatment: Gait belt Activity Tolerance: Patient tolerated treatment well Patient left: with call bell/phone within reach;in bed;with bed alarm set Nurse Communication: Mobility status PT Visit Diagnosis: Difficulty in walking, not elsewhere classified (R26.2)     Time: 0347-4259 PT Time Calculation (min) (ACUTE ONLY): 19 min  Charges:    $Gait Training: 8-22 mins PT General Charges $$ ACUTE PT VISIT: 1 Visit                     Anise Salvo, PT Acute Rehab Services [facility] [phone]    Rayetta Humphrey 05/25/2023, 4:28 PM

## 2023-05-25 NOTE — Plan of Care (Signed)
  Problem: Clinical Measurements: Goal: Ability to maintain clinical measurements within normal limits will improve Outcome: Progressing   Problem: Coping: Goal: Level of anxiety will decrease Outcome: Progressing   Problem: Pain Managment: Goal: General experience of comfort will improve Outcome: Progressing   Problem: Safety: Goal: Ability to remain free from injury will improve Outcome: Progressing   

## 2023-05-25 NOTE — Discharge Summary (Signed)
Physician Discharge Summary   Patient: [name] MRN: [id number] DOB: [birth year]  Admit date:     05/19/2023  Discharge date: 05/25/23  Discharge Physician: Alba Cory   PCP: Artis Delay, MD   Recommendations at discharge:    Needs to follow up with Wound care Needs to follow up with oncology Please make sure palliative care follows on patient.   Discharge Diagnoses: Principal Problem:   Intractable pain Active Problems:   Breast cancer, stage 4, right (HCC)   Thrombocytosis   Wound healing, delayed  Resolved Problems:   * No resolved hospital problems. *  Hospital Course: 62 year old with past medical history significant for right breast cancer, with chronic right chest and back wounds, chronic cancer-related pain presents with uncontrolled pain at the site of her chronic wounds. She has been taking MS Contin and oral Dilaudid at home as prescribed. She has been noticed to have worsening drainage from her wounds. She was also noted to be tachycardic with heart rates in the 140s. Chest x-ray perihilar and lower lobe atelectasis versus pneumonia with a small right pleural effusion.   Assessment and Plan: 1-Intractable cancer-related pain, chest;  Continue with MS Contin now change to TID> , changed frequency of IV Dilaudid to every 3 hours.   Appreciate palliative care with management of pain.  Started on gabapentin.  Discharge on Morphine, dilaudid. Gabapentin.    2-Chronic chest wall wound, with active infection: Patient having worsening drainage from wound and foul-smelling. -Continue with  IV antibiotics, change to cefazolin and flagyl. Wound growing MSSA and Diphtheroids.   -Wound care consulted and following. Local care.  Pain management.  Discharge on Flagyl and keflex for 10 days.   Breast cancer: Xeloda on hold.  Dr. Bertis Ruddy following.      Tachycardia; suspect related to hypovolemia and pain.  Denies pleuritic chest pain.   Dopplers of lower  extremity negative. Resolved. Discontinue telemetry.    Debility: In the setting of malignancy PT consulted Hypokalemia: Replaced   Anemia; of malignancy: monitor. Hb down to 6.9--5.4. Received 2 units PRBC 8/22. Hb 11.  Denies blood in the stool.    Estimated body mass index is 19.57 kg/m as calculated from the following:   Height as of this encounter: 5\' 6"  (1.676 m).   Weight as of this encounter: 55 kg.          Consultants: oncology,  Palliative Disposition: Skilled nursing facility Diet recommendation:  Discharge Diet Orders (From admission, onward)     Start     Ordered   05/25/23 0000  Diet - low sodium heart healthy        05/25/23 4098           Regular diet DISCHARGE MEDICATION: Allergies as of 05/25/2023   No Known Allergies      Medication List     STOP taking these medications    capecitabine 500 MG tablet Commonly known as: XELODA   sodium hypochlorite 0.125 % Soln Commonly known as: DAKIN'S 1/4 STRENGTH       TAKE these medications    acetaminophen 500 MG tablet Commonly known as: TYLENOL Take 1 tablet (500 mg total) by mouth every 6 (six) hours.   calcium carbonate 500 MG chewable tablet Commonly known as: TUMS - dosed in mg elemental calcium Chew 500 mg by mouth 2 (two) times daily.   cephALEXin 500 MG capsule Commonly known as: KEFLEX Take 1 capsule (500 mg total) by mouth 4 (four) times daily  for 10 days.   cholecalciferol 25 MCG (1000 UNIT) tablet Commonly known as: VITAMIN D3 Take 1,000 Units by mouth daily.   gabapentin 100 MG capsule Commonly known as: NEURONTIN Take 1 capsule (100 mg total) by mouth 3 (three) times daily.   HYDROmorphone 8 MG tablet Commonly known as: DILAUDID Take 1 tablet (8 mg total) by mouth every 3 (three) hours as needed for severe pain. What changed:  when to take this reasons to take this   magnesium oxide 400 (240 Mg) MG tablet Commonly known as: MAG-OX Take 1 tablet (400 mg total)  by mouth daily.   metroNIDAZOLE 0.75 % gel Commonly known as: METROGEL Apply topically daily. Start taking on: May 26, 2023   metroNIDAZOLE 500 MG tablet Commonly known as: Flagyl Take 1 tablet (500 mg total) by mouth 3 (three) times daily for 10 days.   morphine 30 MG 12 hr tablet Commonly known as: MS CONTIN Take 1 tablet (30 mg total) by mouth every 8 (eight) hours for 7 days. What changed: when to take this   multivitamin with minerals tablet Take 1 tablet by mouth daily.   polyethylene glycol 17 g packet Commonly known as: MIRALAX / GLYCOLAX Take 17 g by mouth 2 (two) times daily. What changed:  when to take this reasons to take this   senna 8.6 MG tablet Commonly known as: SENOKOT Take 1 tablet by mouth as needed for constipation.               Discharge Care Instructions  (From admission, onward)           Start     Ordered   05/25/23 0000  Discharge wound care:       Comments: See above   05/25/23 4540            Contact information for after-discharge care     Destination     [facility] Preferred SNF .   Service: Skilled Nursing Contact information: [address] [place] [address] [phone]                    Discharge Exam: Ceasar Mons Weights   05/19/23 1750  Weight: 55 kg   General; NAD  Condition at discharge: stable  The results of significant diagnostics from this hospitalization (including imaging, microbiology, ancillary and laboratory) are listed below for reference.   Imaging Studies: VAS [place] LOWER EXTREMITY VENOUS (DVT)  Result Date: 05/20/2023  Lower Venous DVT Study Patient Name:  [name]  Date of Exam:   05/20/2023 Medical Rec #: [id number]        Accession #:    [id number] Date of Birth: [birth year]         Patient Gender: F Patient Age:   78 years Exam Location:  [facility] Procedure:      VAS [place] LOWER EXTREMITY VENOUS (DVT) Referring Phys: Jon Billings [name]  --------------------------------------------------------------------------------  Indications: Edema.  Risk Factors: Cancer. Comparison Study: No prior studies. Performing Technologist: Chanda Busing RVT  Examination Guidelines: A complete evaluation includes B-mode imaging, spectral Doppler, color Doppler, and power Doppler as needed of all accessible portions of each vessel. Bilateral testing is considered an integral part of a complete examination. Limited examinations for reoccurring indications may be performed as noted. The reflux portion of the exam is performed with the patient in reverse Trendelenburg.  +---------+---------------+---------+-----------+----------+--------------+ RIGHT    CompressibilityPhasicitySpontaneityPropertiesThrombus Aging +---------+---------------+---------+-----------+----------+--------------+ CFV      Full  Yes      Yes                                 +---------+---------------+---------+-----------+----------+--------------+ SFJ      Full                                                        +---------+---------------+---------+-----------+----------+--------------+ FV Prox  Full                                                        +---------+---------------+---------+-----------+----------+--------------+ FV Mid   Full                                                        +---------+---------------+---------+-----------+----------+--------------+ FV DistalFull                                                        +---------+---------------+---------+-----------+----------+--------------+ PFV      Full                                                        +---------+---------------+---------+-----------+----------+--------------+ POP      Full           Yes      Yes                                 +---------+---------------+---------+-----------+----------+--------------+ PTV      Full                                                         +---------+---------------+---------+-----------+----------+--------------+ PERO     Full                                                        +---------+---------------+---------+-----------+----------+--------------+   +---------+---------------+---------+-----------+----------+--------------+ LEFT     CompressibilityPhasicitySpontaneityPropertiesThrombus Aging +---------+---------------+---------+-----------+----------+--------------+ CFV      Full           Yes      Yes                                 +---------+---------------+---------+-----------+----------+--------------+ SFJ  Full                                                        +---------+---------------+---------+-----------+----------+--------------+ FV Prox  Full                                                        +---------+---------------+---------+-----------+----------+--------------+ FV Mid   Full                                                        +---------+---------------+---------+-----------+----------+--------------+ FV DistalFull                                                        +---------+---------------+---------+-----------+----------+--------------+ PFV      Full                                                        +---------+---------------+---------+-----------+----------+--------------+ POP      Full           Yes      Yes                                 +---------+---------------+---------+-----------+----------+--------------+ PTV      Full                                                        +---------+---------------+---------+-----------+----------+--------------+ PERO     Full                                                        +---------+---------------+---------+-----------+----------+--------------+     Summary: RIGHT: - There is no evidence of deep vein thrombosis in the lower  extremity.  - No cystic structure found in the popliteal fossa.  LEFT: - There is no evidence of deep vein thrombosis in the lower extremity.  - No cystic structure found in the popliteal fossa.  *See table(s) above for measurements and observations. Electronically signed by Heath Lark on 05/20/2023 at 8:50:49 PM.    Final    DG Chest Port 1 View  Result Date: 05/19/2023 CLINICAL DATA:  Chest pain and palpitations EXAM: PORTABLE CHEST 1 VIEW COMPARISON:  Radiographs 08/22/2022 and CT chest 03/16/2023 FINDINGS: Stable cardiomediastinal silhouette. The right hilar fullness is likely  at least in part due to the right chest wall lesions better seen on CT 03/16/2023. Right perihilar and lower lobe consolidation. Small right pleural effusion. The left lung is clear. No pneumothorax. IMPRESSION: 1. Right perihilar and lower lobe atelectasis or pneumonia with small right pleural effusion. 2. Right hilar fullness is in at least part due to the right chest wall lesions better seen on CT 03/16/2023. Electronically Signed   By: Minerva Fester M.D.   On: 05/19/2023 19:17    Microbiology: Results for orders placed or performed during the hospital encounter of 05/19/23  Aerobic Culture w Gram Stain (superficial specimen)     Status: None   Collection Time: 05/20/23 12:38 PM   Specimen: Breast; Wound  Result Value Ref Range Status   Specimen Description   Final    BREAST Performed at [facility], [address] [place], [place] [address]    Special Requests   Final    Immunocompromised Performed at [facility], [address] [place], [place] [address]    Gram Stain   Final    NO WBC SEEN RARE GRAM POSITIVE COCCI IN PAIRS RARE GRAM POSITIVE RODS    Culture   Final    ABUNDANT STAPHYLOCOCCUS AUREUS MODERATE DIPHTHEROIDS(CORYNEBACTERIUM SPECIES) Standardized susceptibility testing for this organism is not available. Performed at [facility], [address] [place], [place] [address]    Report Status 05/22/2023 FINAL  Final   Organism ID, Bacteria STAPHYLOCOCCUS AUREUS  Final      Susceptibility   Staphylococcus aureus - MIC*    CIPROFLOXACIN 1 SENSITIVE Sensitive     ERYTHROMYCIN >=8 RESISTANT Resistant     GENTAMICIN <=0.5 SENSITIVE Sensitive     OXACILLIN 0.5 SENSITIVE Sensitive     TETRACYCLINE <=1 SENSITIVE Sensitive     VANCOMYCIN 1 SENSITIVE Sensitive     TRIMETH/SULFA <=10 SENSITIVE Sensitive     CLINDAMYCIN <=0.25 SENSITIVE Sensitive     RIFAMPIN <=0.5 SENSITIVE Sensitive     Inducible Clindamycin NEGATIVE Sensitive     LINEZOLID 2 SENSITIVE Sensitive     * ABUNDANT STAPHYLOCOCCUS AUREUS    Labs: CBC: Recent Labs  Lab 05/19/23 1810 05/20/23 1012 05/21/23 0420 05/21/23 1946 05/22/23 1052 05/23/23 0436  WBC 5.8 4.6 5.5  --  6.0 5.3  NEUTROABS 4.4  --   --   --   --   --   HGB 9.1* 7.5* 6.8* 5.4* 11.6* 11.6*  HCT 29.0* 23.9* 21.9* 17.2* 36.1 35.9*  MCV 86.6 86.3 86.6  --  88.9 88.6  PLT 544* 402* 394  --  351 343   Basic Metabolic Panel: Recent Labs  Lab 05/19/23 1810 05/20/23 1012 05/21/23 0420 05/22/23 1052 05/23/23 0436  NA 135 134* 135 138 138  K 3.7 3.4* 3.4* 3.3* 3.8  CL 99 101 107 110 110  CO2 24 25 22  21* 21*  GLUCOSE 141* 135* 99 113* 89  BUN 20 17 13 12 13   CREATININE 0.97 0.86 0.79 0.66 0.70  CALCIUM 8.3* 7.4* 7.1* 7.0* 7.0*  MG  --  2.2  --   --   --    Liver Function Tests: Recent Labs  Lab 05/19/23 1810  AST 43*  ALT 12  ALKPHOS 73  BILITOT 0.6  PROT 6.6  ALBUMIN 2.7*   CBG: No results for input(s): "GLUCAP" in the last 168 hours.  Discharge time spent: greater than 30 minutes.  Signed: Alba Cory, MD [facility] 05/25/2023

## 2023-05-25 NOTE — Telephone Encounter (Signed)
Returned her call and left a message asking her to call the office back. She left a message that she had a question.

## 2023-05-25 NOTE — Progress Notes (Signed)
Attempted RLE venous duplex exam, patient refusing at this time.  Konrad Felix, RN made aware.  Ernestene Mention, RDMS/RVT

## 2023-05-25 NOTE — TOC Progression Note (Addendum)
Transition of Care [facility]) - Progression Note    Patient Details  Name: [name] MRN: [id number] Date of Birth: [birth year]  [facility] CM/SW Contact  Larrie Kass, LCSW Phone Number: 05/25/2023, 9:45 AM  Clinical Narrative:    CSW spoke with Kia from [facility], she will start insurance auth today. TOC to follow.   [place]  10:40am CSW received a call from Kia with [facility], she reports the facility currently has a COVID outbreak. CSW informed pt of this and explained that [facility] was the only facility that offered her a bed. CSW reminded pt she is stable for discharge and will need to know its ok to start insurance auth. Pt reports she would like to speak with her doctor and call this CSW back. TOC to follow.   12:00pm CSW spoke with pt she reports she is still waiting on a call from her oncology MD. Pt reports she would like ot speak with the facility about her concerns. TOC to follow.   1:00pm Pt still has concerns about her chosen facility having a COVID outbreak. Pt inquired about other facilities. CSW informed pt there are three facilities left pending. CSW contacted [facility]-Declined [facility]- awaiting a callback  [facility]- declined  [facility]- offer pt a bed.   2:00pm CSW spoke with pt about a bed offer from [facility], pt has concerns about how far away [facility] is and still wants to wait to talk with her Oncologist about her decision. CSW reminded she is medically stable for d/c and that insurance authorization needs to be started. Pt refuse to choose a bed for either facility to start insurance auth. Pt wants to speak with the hospitalist.     3:49:pm CSW met with pt at the bedside and explained she was medically stable for discharge and would either have to choose an SNF bed or appeal her discharge. Pt has chosen [facility], CSW spoke with Kia , who will start insurance authorization. TOC to follow.   Expected Discharge Plan: Skilled  Nursing Facility Barriers to Discharge: Continued Medical Work up  Expected Discharge Plan and Services In-house Referral: Clinical Social Work     Living arrangements for the past 2 months: Apartment Expected Discharge Date: 05/25/23                                     Social Determinants of Health (SDOH) Interventions SDOH Screenings   Food Insecurity: No Food Insecurity (05/20/2023)  Housing: Low Risk  (05/20/2023)  Transportation Needs: No Transportation Needs (05/20/2023)  Utilities: Not At Risk (05/20/2023)  Financial Resource Strain: Low Risk  (04/03/2022)  Tobacco Use: Low Risk  (05/19/2023)    Readmission Risk Interventions    08/30/2022    1:46 PM 08/06/2022   10:01 AM  Readmission Risk Prevention Plan  Transportation Screening Complete   PCP or Specialist Appt within 3-5 Days Complete   HRI or Home Care Consult Complete   Social Work Consult for Recovery Care Planning/Counseling Complete   Palliative Care Screening Not Applicable   Medication Review [facility]) Complete Complete  PCP or Specialist appointment within 3-5 days of discharge  Complete  HRI or Home Care Consult  Complete  SW Recovery Care/Counseling Consult  Complete  Palliative Care Screening  Not Applicable  Skilled Nursing Facility  Complete

## 2023-05-25 NOTE — Progress Notes (Addendum)
Daily Progress Note   Patient Name: [name]       Date: 05/25/2023 DOB: Jan 30, 1961  Age: 62 y.o. MRN#: [id number] Attending Physician: Alba Cory, MD Primary Care Physician: Artis Delay, MD Admit Date: 05/19/2023  Reason for Consultation/Follow-up: Establishing goals of care, Non pain symptom management, and Pain control   Subjective:  Awake alert, sitting in chair, pain control is "about the same"  Medication history noted, she has not been asking for the PRN PO Dilaudid, discussed medication use with her in detail.    PO scheduled Tylenol    Advised patient to ask for IV Dilaudid for severe breakthrough pain.   Length of Stay: 5  Current Medications: Scheduled Meds:   acetaminophen  500 mg Oral Q6H WA   enoxaparin (LOVENOX) injection  40 mg Subcutaneous Daily   feeding supplement  1 Container Oral BID BM   feeding supplement  237 mL Oral BID BM   gabapentin  100 mg Oral TID   metroNIDAZOLE   Topical Daily   morphine  30 mg Oral Q8H   nutrition supplement (JUVEN)  1 packet Oral BID BM   polyethylene glycol  17 g Oral BID   senna  2 tablet Oral BID   sodium chloride flush  3 mL Intravenous Q12H    Continuous Infusions:   ceFAZolin (ANCEF) IV 2 g (05/25/23 0558)   metronidazole 500 mg (05/25/23 0934)    PRN Meds: acetaminophen **OR** acetaminophen, bisacodyl, diphenhydrAMINE, HYDROmorphone (DILAUDID) injection, HYDROmorphone, ondansetron **OR** ondansetron (ZOFRAN) IV  Physical Exam         Dressing on chest wall not malodorous   Regular work of breathing Awake alert No distress No edema Thin and cancer related cachexia evident Sitting in chair  Vital Signs: BP 94/61 (BP Location: Left Arm)   Pulse 91   Temp 97.8 F (36.6 C) (Oral)   Resp 17   Ht 5'  6" (1.676 m)   Wt 55 kg   SpO2 100%   BMI 19.57 kg/m  SpO2: SpO2: 100 % O2 Device: O2 Device: Room Air O2 Flow Rate: O2 Flow Rate (L/min): 2 L/min  Intake/output summary:  Intake/Output Summary (Last 24 hours) at 05/25/2023 1247 Last data filed at 05/25/2023 1159 Gross per 24 hour  Intake 780.03 ml  Output --  Net 780.03  ml   LBM: Last BM Date : 05/22/23 Baseline Weight: Weight: 55 kg Most recent weight: Weight: 55 kg       Palliative Assessment/Data:      Patient Active Problem List   Diagnosis Date Noted   Intractable pain 05/19/2023   Non compliance with medical treatment 05/08/2023   Anemia due to antineoplastic chemotherapy 04/07/2023   Weight loss, non-intentional 01/22/2023   MSSA (methicillin susceptible Staphylococcus aureus) infection 08/12/2022   Empyema, right (HCC) 08/11/2022   Need for management of chest tube 08/11/2022   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   Need for emotional support 08/05/2022   Constipation 08/05/2022   Palliative care encounter 08/05/2022   Protein-calorie malnutrition, severe 08/05/2022   Pleural effusion, right 08/04/2022   Pleural effusion on right 08/02/2022   Protein calorie malnutrition (HCC) 08/02/2022   Goals of care, counseling/discussion 07/25/2022   Vitamin D deficiency 06/12/2022   Situational depression 05/30/2022   GERD (gastroesophageal reflux disease) 05/30/2022   Pancytopenia, acquired (HCC) 05/08/2022   Wound healing, delayed 05/08/2022   Physical deconditioning 04/17/2022   Poor social situation 04/02/2022   Cancer associated pain 03/31/2022   Poor venous access 03/31/2022   Other constipation 03/31/2022   Breast cancer metastasized to bone, right (HCC) 03/23/2022   Breast cancer, stage 4, right (HCC) 03/22/2022   Pleural effusion 03/22/2022   Cellulitis 03/22/2022   Microcytic anemia 03/22/2022   Thrombocytosis 03/22/2022   Lactic acidosis 03/22/2022   Essential hypertension 01/27/2018   Screening  for diabetes mellitus 01/27/2018   Screening for thyroid disorder 01/27/2018    Palliative Care Assessment & Plan   Patient Profile:    Assessment:  62 year old lady with stage IV right breast cancer and cancer associated seated pain, ongoing weight loss and ongoing chest wall and back wounds. Patient follows with Dr. Bertis Ruddy from [facility] and is admitted to hospital medicine service for cancer associated pain and chest wall wounds which are not improving.  Recommendations/Plan: Patient history noted. Continue Gabapentin   MS Contin to 30 mg every 8 hours.   scheduled Tylenol.  Continue IV Dilaudid as needed for now, has oral Dilaudid at home.  Continue MetroGel Topical.    Continue to monitor pain control, encouraged patient to ask for PO PRN Dilaudid, based on her last 24 hour use.  Agree with SNF rehab with palliative on discharge - TOC note reviewed.      Code Status:    Code Status Orders  (From admission, onward)           Start     Ordered   05/19/23 1940  Do not attempt resuscitation (DNR)  Continuous       Question Answer Comment  If patient has no pulse and is not breathing Do Not Attempt Resuscitation   If patient has a pulse and/or is breathing: Medical Treatment Goals LIMITED ADDITIONAL INTERVENTIONS: Use medication/IV fluids and cardiac monitoring as indicated; Do not use intubation or mechanical ventilation (DNI), also provide comfort medications.  Transfer to Progressive/Stepdown as indicated, avoid Intensive Care.   Consent: Discussion documented in EHR or advanced directives reviewed      05/19/23 1940           Code Status History     Date Active Date Inactive Code Status Order ID Comments User Context   08/07/2022 0914 08/31/2022 0007 DNR [id number]  Artis Delay, MD Inpatient   08/02/2022 2231 08/07/2022 0914 Full Code [id number]  Opyd, Lavone Neri, MD ED  03/22/2022 2221 03/27/2022 2015 Full Code [id number]  John Giovanni, MD ED       Advance Directive Documentation    Flowsheet Row Most Recent Value  Type of Advance Directive Healthcare Power of Attorney, Living will  Pre-existing out of facility DNR order (yellow form or pink MOST form) --  "MOST" Form in Place? --       Prognosis:  Unable to determine  Discharge Planning: Skilled Nursing Facility for rehab with Palliative care service follow-up Also recommend palliative follow up at [facility]   Care plan was discussed with patient    Thank you for allowing the Palliative Medicine Team to assist in the care of this patient.  Mod MDM.     Greater than 50%  of this time was spent counseling and coordinating care related to the above assessment and plan.  Rosalin Hawking, MD  Please contact Palliative Medicine Team phone at [phone] for questions and concerns.

## 2023-05-25 NOTE — Progress Notes (Signed)
Swelling noted to BLE, R>L. No charting of this had been done previously- per patient, this is new. MD Regalado made aware. RLE doppler ordered.

## 2023-05-25 NOTE — Telephone Encounter (Signed)
Returned her call. She thinks that she may be d/ced to a NH. Told her that I would sent Dr. Bertis Ruddy a message. She will call the office back for questions/ concerns.

## 2023-05-26 ENCOUNTER — Encounter (HOSPITAL_COMMUNITY): Payer: BC Managed Care – PPO

## 2023-05-26 ENCOUNTER — Other Ambulatory Visit (HOSPITAL_COMMUNITY): Payer: Self-pay

## 2023-05-26 DIAGNOSIS — R52 Pain, unspecified: Secondary | ICD-10-CM | POA: Diagnosis not present

## 2023-05-26 DIAGNOSIS — Z79899 Other long term (current) drug therapy: Secondary | ICD-10-CM

## 2023-05-26 DIAGNOSIS — G893 Neoplasm related pain (acute) (chronic): Secondary | ICD-10-CM | POA: Diagnosis not present

## 2023-05-26 DIAGNOSIS — Z515 Encounter for palliative care: Secondary | ICD-10-CM | POA: Diagnosis not present

## 2023-05-26 DIAGNOSIS — C50911 Malignant neoplasm of unspecified site of right female breast: Secondary | ICD-10-CM | POA: Diagnosis not present

## 2023-05-26 DIAGNOSIS — T148XXD Other injury of unspecified body region, subsequent encounter: Secondary | ICD-10-CM | POA: Diagnosis not present

## 2023-05-26 DIAGNOSIS — Z7189 Other specified counseling: Secondary | ICD-10-CM

## 2023-05-26 LAB — CBC
HCT: 37.4 % (ref 36.0–46.0)
Hemoglobin: 11.7 g/dL — ABNORMAL LOW (ref 12.0–15.0)
MCH: 27.9 pg (ref 26.0–34.0)
MCHC: 31.3 g/dL (ref 30.0–36.0)
MCV: 89.3 fL (ref 80.0–100.0)
Platelets: 365 10*3/uL (ref 150–400)
RBC: 4.19 MIL/uL (ref 3.87–5.11)
RDW: 16.5 % — ABNORMAL HIGH (ref 11.5–15.5)
WBC: 6.3 10*3/uL (ref 4.0–10.5)
nRBC: 0 % (ref 0.0–0.2)

## 2023-05-26 MED ORDER — HYDROMORPHONE HCL 2 MG PO TABS
10.0000 mg | ORAL_TABLET | ORAL | Status: DC | PRN
  Administered 2023-05-26 – 2023-05-28 (×2): 10 mg via ORAL
  Filled 2023-05-26 (×3): qty 5

## 2023-05-26 MED ORDER — SODIUM CHLORIDE 0.9 % IV BOLUS
250.0000 mL | Freq: Once | INTRAVENOUS | Status: AC
  Administered 2023-05-26: 250 mL via INTRAVENOUS

## 2023-05-26 NOTE — Progress Notes (Signed)
Daily Progress Note   Patient Name: [name]       Date: 05/26/2023 DOB: Mar 13, 1961  Age: 62 y.o. MRN#: [id number] Attending Physician: Alba Cory, MD Primary Care Physician: Artis Delay, MD Admit Date: 05/19/2023 Length of Stay: 6 days  Reason for Consultation/Follow-up: Pain control  Subjective:   CC: Patient notes pain only mildly improved with oral dilaudid. Following up regarding pain management.   Subjective:  Reviewed EMR prior to presenting to bedside.  At time of EMR review patient had received p.o. Dilaudid 8 mg x 3 doses and IV Dilaudid 2 mg x 1 dose in the past 24 hours.  Patient continues to receive scheduled MS Contin 30 mg every 8 hours and gabapentin 100 mg 3 times daily.  Presented to bedside to check on patient.  Patient seen sitting up in bedside chair.  Introduced myself as a member of the palliative medicine team.  Followed up regarding patient's symptom management at this time.  Patient notes that the pain in her chest is only mildly improved by oral Dilaudid.  Discussed increasing p.o. Dilaudid dose to 10 mg every 3 hours as needed.  Patient agreement with this plan.  Discussed if patient needing frequent doses of as needed, likely needs increase of long-acting medication.  Patient acknowledged this though only wanting to increase oral Dilaudid as needed medication at this time.  May also need to consider increasing gabapentin in the future.   Inquired about last bowel movement.  Patient states her last bowel movement was yesterday, 8/26,  and she denies any feelings of constipation.  All questions answered at that time.  Emphasized importance of follow-up with palliative medicine in the outpatient setting.  Noted palliative medicine team will continue following along with patient's medical journey.  Review of Systems Pain in chest from metastatic breast cancer Objective:   Vital Signs:  BP 100/65 (BP Location: Left Arm)   Pulse 92   Temp 97.6 F  (36.4 C) (Oral)   Resp 18   Ht 5\' 6"  (1.676 m)   Wt 55 kg   SpO2 100%   BMI 19.57 kg/m   Physical Exam: General: NAD, alert, sitting up in bedside chair, chronically ill-appearing, frail Eyes: No drainage noted HENT: moist mucous membranes Cardiovascular: RRR, Respiratory: no increased work of breathing noted, not in respiratory distress Abdomen: not distended Neuro: A&Ox4, following commands easily Psych: appropriately answers all questions  Imaging:  I personally reviewed recent imaging.   Assessment & Plan:   Assessment: Patient is a 62 year old female with a past medical history of metastatic right-sided breast cancer with ongoing weight loss and chest wall and back wounds who was admitted on 05/19/2023 from cancer center for cancer associated pain and chest wall wounds that were not improving.  Palliative medicine team consulted for pain management.  Recommendations/Plan:  # Symptom management:  -Pain, acute on chronic in the setting of metastatic breast cancer with chest wall and back wounds   -Continue MS Contin 30 mg every 8 hours scheduled   -Continue gabapentin 100 mg 3 times daily   -Increase oral Dilaudid 10 mg every 3 hours as needed (since still needing IV for breakthrough pain)   -Continue IV Dilaudid 2 mg as needed   -Continue topical MetroGel Will likely need further titration of long-acting and potentially gabapentin in the future.  # Discharge Planning: Skilled Nursing Facility for rehab with Palliative care service follow-up  -Placed referral for outpatient palliative medicine at [facility].  Discussed with: patient,  RN  Thank you for allowing the palliative care team to participate in the care Deirdre Priest.  Alvester Morin, DO Palliative Care Provider PMT # [phone]  If patient remains symptomatic despite maximum doses, please call PMT at [phone] between 0700 and 1900. Outside of these hours, please call attending, as PMT does not have night  coverage.  *Please note that this is a verbal dictation therefore any spelling or grammatical errors are due to the "[employer]agon [employer] One" system interpretation.

## 2023-05-26 NOTE — Discharge Summary (Signed)
Physician Discharge Summary   Patient: [name] MRN: [id number] DOB: [birth year]  Admit date:     05/19/2023  Discharge date: 05/26/23  Discharge Physician: Alba Cory   PCP: Artis Delay, MD   Recommendations at discharge:    Needs to follow up with Wound care Needs to follow up with oncology Please make sure palliative care follows on patient.   Discharge Diagnoses: Principal Problem:   Intractable pain Active Problems:   Breast cancer, stage 4, right (HCC)   Thrombocytosis   Wound healing, delayed  Resolved Problems:   * No resolved hospital problems. *  Hospital Course: 62 year old with past medical history significant for right breast cancer, with chronic right chest and back wounds, chronic cancer-related pain presents with uncontrolled pain at the site of her chronic wounds. She has been taking MS Contin and oral Dilaudid at home as prescribed. She has been noticed to have worsening drainage from her wounds. She was also noted to be tachycardic with heart rates in the 140s. Chest x-ray perihilar and lower lobe atelectasis versus pneumonia with a small right pleural effusion.   Assessment and Plan: 1-Intractable cancer-related pain, chest;  Continue with MS Contin now change to TID> , changed frequency of IV Dilaudid to every 3 hours.   Appreciate palliative care with management of pain.  Started on gabapentin.  Discharge on Morphine, dilaudid. Gabapentin.    2-Chronic chest wall wound, with active infection: Patient having worsening drainage from wound and foul-smelling. -Continue with  IV antibiotics, change to cefazolin and flagyl. Wound growing MSSA and Diphtheroids.   -Wound care consulted and following. Local care.  Pain management.  Discharge on Flagyl and keflex for 10 days.   Breast cancer: Xeloda on hold.  Dr. Bertis Ruddy following.      Tachycardia; suspect related to hypovolemia and pain.  Denies pleuritic chest pain.   Dopplers of lower  extremity negative. Resolved. Discontinue telemetry.    Debility: In the setting of malignancy PT consulted Hypokalemia: Replaced   Anemia; of malignancy: monitor. Hb down to 6.9--5.4. Received 2 units PRBC 8/22. Hb 11.  Denies blood in the stool.    Estimated body mass index is 19.57 kg/m as calculated from the following:   Height as of this encounter: 5\' 6"  (1.676 m).   Weight as of this encounter: 55 kg.    Stable for discharge waiting insurance authorization..       Consultants: oncology,  Palliative Disposition: Skilled nursing facility Diet recommendation:  Discharge Diet Orders (From admission, onward)     Start     Ordered   05/25/23 0000  Diet - low sodium heart healthy        05/25/23 0927           Regular diet DISCHARGE MEDICATION: Allergies as of 05/26/2023   No Known Allergies      Medication List     STOP taking these medications    capecitabine 500 MG tablet Commonly known as: XELODA   sodium hypochlorite 0.125 % Soln Commonly known as: DAKIN'S 1/4 STRENGTH       TAKE these medications    acetaminophen 500 MG tablet Commonly known as: TYLENOL Take 1 tablet (500 mg total) by mouth every 6 (six) hours.   calcium carbonate 500 MG chewable tablet Commonly known as: TUMS - dosed in mg elemental calcium Chew 500 mg by mouth 2 (two) times daily.   cephALEXin 500 MG capsule Commonly known as: KEFLEX Take 1 capsule (500 mg total)  by mouth 4 (four) times daily for 10 days.   cholecalciferol 25 MCG (1000 UNIT) tablet Commonly known as: VITAMIN D3 Take 1,000 Units by mouth daily.   gabapentin 100 MG capsule Commonly known as: NEURONTIN Take 1 capsule (100 mg total) by mouth 3 (three) times daily.   HYDROmorphone 8 MG tablet Commonly known as: DILAUDID Take 1 tablet (8 mg total) by mouth every 3 (three) hours as needed for severe pain. What changed:  when to take this reasons to take this   magnesium oxide 400 (240 Mg) MG  tablet Commonly known as: MAG-OX Take 1 tablet (400 mg total) by mouth daily.   metroNIDAZOLE 0.75 % gel Commonly known as: METROGEL Apply topically daily.   metroNIDAZOLE 500 MG tablet Commonly known as: Flagyl Take 1 tablet (500 mg total) by mouth 3 (three) times daily for 10 days.   morphine 30 MG 12 hr tablet Commonly known as: MS CONTIN Take 1 tablet (30 mg total) by mouth every 8 (eight) hours for 7 days. What changed: when to take this   multivitamin with minerals tablet Take 1 tablet by mouth daily.   polyethylene glycol 17 g packet Commonly known as: MIRALAX / GLYCOLAX Take 17 g by mouth 2 (two) times daily. What changed:  when to take this reasons to take this   senna 8.6 MG tablet Commonly known as: SENOKOT Take 1 tablet by mouth as needed for constipation.               Discharge Care Instructions  (From admission, onward)           Start     Ordered   05/25/23 0000  Discharge wound care:       Comments: See above   05/25/23 1478             Discharge Exam: Filed Weights   05/19/23 1750  Weight: 55 kg   General; NAD  Condition at discharge: stable  The results of significant diagnostics from this hospitalization (including imaging, microbiology, ancillary and laboratory) are listed below for reference.   Imaging Studies: VAS [place] LOWER EXTREMITY VENOUS (DVT)  Result Date: 05/20/2023  Lower Venous DVT Study Patient Name:  [name]  Date of Exam:   05/20/2023 Medical Rec #: [id number]        Accession #:    [id number] Date of Birth: [birth year]         Patient Gender: F Patient Age:   77 years Exam Location:  [facility] Procedure:      VAS [place] LOWER EXTREMITY VENOUS (DVT) Referring Phys: Jon Billings Maddon Horton --------------------------------------------------------------------------------  Indications: Edema.  Risk Factors: Cancer. Comparison Study: No prior studies. Performing Technologist: Chanda Busing RVT  Examination  Guidelines: A complete evaluation includes B-mode imaging, spectral Doppler, color Doppler, and power Doppler as needed of all accessible portions of each vessel. Bilateral testing is considered an integral part of a complete examination. Limited examinations for reoccurring indications may be performed as noted. The reflux portion of the exam is performed with the patient in reverse Trendelenburg.  +---------+---------------+---------+-----------+----------+--------------+ RIGHT    CompressibilityPhasicitySpontaneityPropertiesThrombus Aging +---------+---------------+---------+-----------+----------+--------------+ CFV      Full           Yes      Yes                                 +---------+---------------+---------+-----------+----------+--------------+ SFJ  Full                                                        +---------+---------------+---------+-----------+----------+--------------+ FV Prox  Full                                                        +---------+---------------+---------+-----------+----------+--------------+ FV Mid   Full                                                        +---------+---------------+---------+-----------+----------+--------------+ FV DistalFull                                                        +---------+---------------+---------+-----------+----------+--------------+ PFV      Full                                                        +---------+---------------+---------+-----------+----------+--------------+ POP      Full           Yes      Yes                                 +---------+---------------+---------+-----------+----------+--------------+ PTV      Full                                                        +---------+---------------+---------+-----------+----------+--------------+ PERO     Full                                                         +---------+---------------+---------+-----------+----------+--------------+   +---------+---------------+---------+-----------+----------+--------------+ LEFT     CompressibilityPhasicitySpontaneityPropertiesThrombus Aging +---------+---------------+---------+-----------+----------+--------------+ CFV      Full           Yes      Yes                                 +---------+---------------+---------+-----------+----------+--------------+ SFJ      Full                                                        +---------+---------------+---------+-----------+----------+--------------+  FV Prox  Full                                                        +---------+---------------+---------+-----------+----------+--------------+ FV Mid   Full                                                        +---------+---------------+---------+-----------+----------+--------------+ FV DistalFull                                                        +---------+---------------+---------+-----------+----------+--------------+ PFV      Full                                                        +---------+---------------+---------+-----------+----------+--------------+ POP      Full           Yes      Yes                                 +---------+---------------+---------+-----------+----------+--------------+ PTV      Full                                                        +---------+---------------+---------+-----------+----------+--------------+ PERO     Full                                                        +---------+---------------+---------+-----------+----------+--------------+     Summary: RIGHT: - There is no evidence of deep vein thrombosis in the lower extremity.  - No cystic structure found in the popliteal fossa.  LEFT: - There is no evidence of deep vein thrombosis in the lower extremity.  - No cystic structure found in the popliteal fossa.   *See table(s) above for measurements and observations. Electronically signed by Heath Lark on 05/20/2023 at 8:50:49 PM.    Final    DG Chest Port 1 View  Result Date: 05/19/2023 CLINICAL DATA:  Chest pain and palpitations EXAM: PORTABLE CHEST 1 VIEW COMPARISON:  Radiographs 08/22/2022 and CT chest 03/16/2023 FINDINGS: Stable cardiomediastinal silhouette. The right hilar fullness is likely at least in part due to the right chest wall lesions better seen on CT 03/16/2023. Right perihilar and lower lobe consolidation. Small right pleural effusion. The left lung is clear. No pneumothorax. IMPRESSION: 1. Right perihilar and lower lobe atelectasis or pneumonia with small right pleural effusion. 2. Right hilar fullness is in at least part  due to the right chest wall lesions better seen on CT 03/16/2023. Electronically Signed   By: Minerva Fester M.D.   On: 05/19/2023 19:17    Microbiology: Results for orders placed or performed during the hospital encounter of 05/19/23  Aerobic Culture w Gram Stain (superficial specimen)     Status: None   Collection Time: 05/20/23 12:38 PM   Specimen: Breast; Wound  Result Value Ref Range Status   Specimen Description   Final    BREAST Performed at [facility], [address], [place], [place] [address]    Special Requests   Final    Immunocompromised Performed at [facility], [address] [place], [place] [address]    Gram Stain   Final    NO WBC SEEN RARE GRAM POSITIVE COCCI IN PAIRS RARE GRAM POSITIVE RODS    Culture   Final    ABUNDANT STAPHYLOCOCCUS AUREUS MODERATE DIPHTHEROIDS(CORYNEBACTERIUM SPECIES) Standardized susceptibility testing for this organism is not available. Performed at [facility], [address], [place], [place] [address]    Report Status 05/22/2023 FINAL  Final   Organism ID, Bacteria STAPHYLOCOCCUS AUREUS  Final      Susceptibility   Staphylococcus aureus - MIC*     CIPROFLOXACIN 1 SENSITIVE Sensitive     ERYTHROMYCIN >=8 RESISTANT Resistant     GENTAMICIN <=0.5 SENSITIVE Sensitive     OXACILLIN 0.5 SENSITIVE Sensitive     TETRACYCLINE <=1 SENSITIVE Sensitive     VANCOMYCIN 1 SENSITIVE Sensitive     TRIMETH/SULFA <=10 SENSITIVE Sensitive     CLINDAMYCIN <=0.25 SENSITIVE Sensitive     RIFAMPIN <=0.5 SENSITIVE Sensitive     Inducible Clindamycin NEGATIVE Sensitive     LINEZOLID 2 SENSITIVE Sensitive     * ABUNDANT STAPHYLOCOCCUS AUREUS    Labs: CBC: Recent Labs  Lab 05/19/23 1810 05/20/23 1012 05/21/23 0420 05/21/23 1946 05/22/23 1052 05/23/23 0436 05/26/23 0407  WBC 5.8 4.6 5.5  --  6.0 5.3 6.3  NEUTROABS 4.4  --   --   --   --   --   --   HGB 9.1* 7.5* 6.8* 5.4* 11.6* 11.6* 11.7*  HCT 29.0* 23.9* 21.9* 17.2* 36.1 35.9* 37.4  MCV 86.6 86.3 86.6  --  88.9 88.6 89.3  PLT 544* 402* 394  --  351 343 365   Basic Metabolic Panel: Recent Labs  Lab 05/19/23 1810 05/20/23 1012 05/21/23 0420 05/22/23 1052 05/23/23 0436  NA 135 134* 135 138 138  K 3.7 3.4* 3.4* 3.3* 3.8  CL 99 101 107 110 110  CO2 24 25 22  21* 21*  GLUCOSE 141* 135* 99 113* 89  BUN 20 17 13 12 13   CREATININE 0.97 0.86 0.79 0.66 0.70  CALCIUM 8.3* 7.4* 7.1* 7.0* 7.0*  MG  --  2.2  --   --   --    Liver Function Tests: Recent Labs  Lab 05/19/23 1810  AST 43*  ALT 12  ALKPHOS 73  BILITOT 0.6  PROT 6.6  ALBUMIN 2.7*   CBG: No results for input(s): "GLUCAP" in the last 168 hours.  Discharge time spent: greater than 30 minutes.  Signed: Alba Cory, MD [facility] 05/26/2023

## 2023-05-26 NOTE — Plan of Care (Signed)
  Problem: Clinical Measurements: Goal: Ability to maintain clinical measurements within normal limits will improve Outcome: Progressing Goal: Diagnostic test results will improve Outcome: Progressing   Problem: Coping: Goal: Level of anxiety will decrease Outcome: Progressing   Problem: Pain Managment: Goal: General experience of comfort will improve Outcome: Progressing   Problem: Safety: Goal: Ability to remain free from injury will improve Outcome: Progressing

## 2023-05-26 NOTE — Progress Notes (Signed)
Assumed care of pt from off going RN. No change in am assessment. Cont with plan of care

## 2023-05-26 NOTE — Progress Notes (Signed)
Mobility Specialist - Progress Note   05/26/23 1527  Mobility  Activity Ambulated with assistance in hallway  Level of Assistance Standby assist, set-up cues, supervision of patient - no hands on  Assistive Device Front wheel walker  Distance Ambulated (ft) 700 ft  Range of Motion/Exercises Active  Activity Response Tolerated well  Mobility Referral Yes  $Mobility charge 1 Mobility  Mobility Specialist Start Time (ACUTE ONLY) 1510  Mobility Specialist Stop Time (ACUTE ONLY) 1527  Mobility Specialist Time Calculation (min) (ACUTE ONLY) 17 min   Pt was found in bed and agreeable to ambulate. No complaints with session and had x1 brief standing rest break. At EOS returned to bed with all needs met. Call bell in reach.  Billey Chang Mobility Specialist

## 2023-05-26 NOTE — Progress Notes (Signed)
Mobility Specialist - Progress Note   05/26/23 0849  Mobility  Activity Ambulated with assistance in hallway  Level of Assistance Standby assist, set-up cues, supervision of patient - no hands on  Assistive Device Front wheel walker  Distance Ambulated (ft) 700 ft  Range of Motion/Exercises Active  Activity Response Tolerated well  Mobility Referral Yes  $Mobility charge 1 Mobility  Mobility Specialist Start Time (ACUTE ONLY) [id number]  Mobility Specialist Stop Time (ACUTE ONLY) 0849  Mobility Specialist Time Calculation (min) (ACUTE ONLY) 11 min   Pt was found in bed and agreeable to ambulate. No complaints with session. Returned to recliner chair with all needs met. Call bell in reach and chair alarm on.  Billey Chang Mobility Specialist

## 2023-05-26 NOTE — TOC Progression Note (Signed)
Transition of Care [facility]) - Progression Note    Patient Details  Name: [name] MRN: [id number] Date of Birth: 10-29-1960  Transition of [facility] CM/SW Contact  Larrie Kass, LCSW Phone Number: 05/26/2023, 9:11 AM  Clinical Narrative:    Insurance auth still pending.    Expected Discharge Plan: Skilled Nursing Facility Barriers to Discharge: Insurance Authorization  Expected Discharge Plan and Services In-house Referral: Clinical Social Work     Living arrangements for the past 2 months: Apartment Expected Discharge Date: 05/25/23                                     Social Determinants of Health (SDOH) Interventions SDOH Screenings   Food Insecurity: No Food Insecurity (05/20/2023)  Housing: Low Risk  (05/20/2023)  Transportation Needs: No Transportation Needs (05/20/2023)  Utilities: Not At Risk (05/20/2023)  Financial Resource Strain: Low Risk  (04/03/2022)  Tobacco Use: Low Risk  (05/19/2023)    Readmission Risk Interventions    08/30/2022    1:46 PM 08/06/2022   10:01 AM  Readmission Risk Prevention Plan  Transportation Screening Complete   PCP or Specialist Appt within 3-5 Days Complete   HRI or Home Care Consult Complete   Social Work Consult for Recovery Care Planning/Counseling Complete   Palliative Care Screening Not Applicable   Medication Review Oceanographer) Complete Complete  PCP or Specialist appointment within 3-5 days of discharge  Complete  HRI or Home Care Consult  Complete  SW Recovery Care/Counseling Consult  Complete  Palliative Care Screening  Not Applicable  Skilled Nursing Facility  Complete

## 2023-05-27 DIAGNOSIS — Z7189 Other specified counseling: Secondary | ICD-10-CM | POA: Diagnosis not present

## 2023-05-27 DIAGNOSIS — Z79899 Other long term (current) drug therapy: Secondary | ICD-10-CM | POA: Diagnosis not present

## 2023-05-27 DIAGNOSIS — D75839 Thrombocytosis, unspecified: Secondary | ICD-10-CM

## 2023-05-27 DIAGNOSIS — Z515 Encounter for palliative care: Secondary | ICD-10-CM | POA: Diagnosis not present

## 2023-05-27 DIAGNOSIS — G893 Neoplasm related pain (acute) (chronic): Secondary | ICD-10-CM

## 2023-05-27 DIAGNOSIS — C50911 Malignant neoplasm of unspecified site of right female breast: Secondary | ICD-10-CM | POA: Diagnosis not present

## 2023-05-27 DIAGNOSIS — C7951 Secondary malignant neoplasm of bone: Secondary | ICD-10-CM

## 2023-05-27 MED ORDER — DICLOFENAC SODIUM 1 % EX GEL
2.0000 g | Freq: Four times a day (QID) | CUTANEOUS | Status: DC
  Administered 2023-05-27 – 2023-05-30 (×9): 2 g via TOPICAL
  Filled 2023-05-27: qty 100

## 2023-05-27 MED ORDER — GABAPENTIN 300 MG PO CAPS
300.0000 mg | ORAL_CAPSULE | Freq: Three times a day (TID) | ORAL | Status: DC
  Administered 2023-05-27 – 2023-05-30 (×9): 300 mg via ORAL
  Filled 2023-05-27 (×9): qty 1

## 2023-05-27 NOTE — Progress Notes (Signed)
Mobility Specialist - Progress Note   05/27/23 1348  Mobility  Activity Ambulated with assistance in hallway  Level of Assistance Standby assist, set-up cues, supervision of patient - no hands on  Assistive Device Front wheel walker  Distance Ambulated (ft) 1050 ft  Range of Motion/Exercises Active  Activity Response Tolerated well  Mobility Referral Yes  $Mobility charge 1 Mobility  Mobility Specialist Start Time (ACUTE ONLY) 1325  Mobility Specialist Stop Time (ACUTE ONLY) 1348  Mobility Specialist Time Calculation (min) (ACUTE ONLY) 23 min   Pt was found in bed and agreeable to ambulate. No complaints with session. At EOS returned to sit EOB with all needs met. Call bell in reach.   Billey Chang Mobility Specialist

## 2023-05-27 NOTE — Progress Notes (Signed)
Mobility Specialist - Progress Note   05/27/23 0909  Mobility  Activity Ambulated with assistance in hallway  Level of Assistance Standby assist, set-up cues, supervision of patient - no hands on  Assistive Device Front wheel walker  Distance Ambulated (ft) 700 ft  Range of Motion/Exercises Active  Activity Response Tolerated well  Mobility Referral Yes  $Mobility charge 1 Mobility  Mobility Specialist Start Time (ACUTE ONLY) 0845  Mobility Specialist Stop Time (ACUTE ONLY) 0909  Mobility Specialist Time Calculation (min) (ACUTE ONLY) 24 min   Pt was found in bed and agreeable to ambulate. No complaints with session. Had x1 episode of LOB due letting go of RW on R side. Able to self correct and cued pt on using RW properly. At EOS returned to sit EOB with all needs met. Call bell in reach.  Billey Chang Mobility Specialist

## 2023-05-27 NOTE — TOC Progression Note (Addendum)
Transition of Care [facility]) - Progression Note    Patient Details  Name: [name] MRN: [id number] Date of Birth: 1960-11-15  Transition of Care [facility]) CM/SW Contact  Larrie Kass, LCSW Phone Number: 05/27/2023, 9:14 AM  Clinical Narrative:     Insurance auth still pending. TOC to follow.   10:30am CSW received call from Kia with [facility], she stated pt's insurance auth was denied.  11:00am CSW spoke with [employer] and faxed over additional clinicals describing pt's wound and dressing changes.  1:00pm CSW faxed over additional clinicals to [facility] for further review. TOC to follow.    Expected Discharge Plan: Skilled Nursing Facility Barriers to Discharge: Insurance Authorization  Expected Discharge Plan and Services In-house Referral: Clinical Social Work     Living arrangements for the past 2 months: Apartment Expected Discharge Date: 05/25/23                                     Social Determinants of Health (SDOH) Interventions SDOH Screenings   Food Insecurity: No Food Insecurity (05/20/2023)  Housing: Low Risk  (05/20/2023)  Transportation Needs: No Transportation Needs (05/20/2023)  Utilities: Not At Risk (05/20/2023)  Financial Resource Strain: Low Risk  (04/03/2022)  Tobacco Use: Low Risk  (05/19/2023)    Readmission Risk Interventions    08/30/2022    1:46 PM 08/06/2022   10:01 AM  Readmission Risk Prevention Plan  Transportation Screening Complete   PCP or Specialist Appt within 3-5 Days Complete   HRI or Home Care Consult Complete   Social Work Consult for Recovery Care Planning/Counseling Complete   Palliative Care Screening Not Applicable   Medication Review [facility]) Complete Complete  PCP or Specialist appointment within 3-5 days of discharge  Complete  HRI or Home Care Consult  Complete  SW Recovery Care/Counseling Consult  Complete  Palliative Care Screening  Not Applicable  Skilled Nursing Facility  Complete

## 2023-05-27 NOTE — Progress Notes (Signed)
Daily Progress Note   Patient Name: [name]       Date: 05/27/2023 DOB: 09/21/1961  Age: 62 y.o. MRN#: [id number] Attending Physician: Almon Hercules, MD Primary Care Physician: Artis Delay, MD Admit Date: 05/19/2023 Length of Stay: 7 days  Reason for Consultation/Follow-up: Pain control  Subjective:   CC: Patient notes pain not improved with oral dilaudid adjustments yesterday. Following up regarding pain management.   Subjective:  Reviewed EMR prior to presenting to bedside.  At time of EMR review patient had received p.o. Dilaudid 10 mg as needed x 1 dose.  Patient initially had not received any IV Dilaudid though did receive a dose today for dressing changes.  Presented to bedside to meet with patient.  Patient laying comfortably in the bed.  Discussed patient's pain management.  Patient again describing continued chronic aching pain in right side of chest where breast mass and wounds are located.  Patient does not feel the oral Dilaudid greatly improved her pain.  Discussed adjusting other medications to hopefully assist with multimodal pain management.  Discussed increasing gabapentin dosing.  Patient denies any adverse effects to gabapentin.  Also discussed topical NSAID use with diclofenac.  Patient willing to consider this as well.  Noted if patient is needing multiple doses of as needed opioid, then can consider increasing long-acting medication.  May also need to consider opioid rotation in the future.  Spent time providing emotional support via active listening.  All questions answered at that time.  Discussed care with bedside RN as well.  Review of Systems Pain in chest from metastatic breast cancer Objective:   Vital Signs:  BP (!) 99/56 (BP Location: Right Arm)   Pulse 80   Temp 97.7 F (36.5 C) (Oral)   Resp 20   Ht 5\' 6"  (1.676 m)   Wt 55 kg   SpO2 95%   BMI 19.57 kg/m   Physical Exam: General: NAD, alert, laying in bed, chronically ill-appearing,  frail Eyes: No drainage noted HENT: moist mucous membranes Cardiovascular: RRR, Respiratory: no increased work of breathing noted, not in respiratory distress Abdomen: not distended Neuro: A&Ox4, following commands easily Psych: appropriately answers all questions  Imaging:  I personally reviewed recent imaging.   Assessment & Plan:   Assessment: Patient is a 62 year old female with a past medical history of metastatic right-sided breast cancer with ongoing weight loss and chest wall and back wounds who was admitted on 05/19/2023 from cancer center for cancer associated pain and chest wall wounds that were not improving.  Palliative medicine team consulted for pain management.  Recommendations/Plan:  # Symptom management:  -Pain, acute on chronic in the setting of metastatic breast cancer with chest wall and back wounds   -Continue MS Contin 30 mg every 8 hours scheduled   -Increase gabapentin to 300 mg 3 times daily   -Continue oral Dilaudid 10 mg every 3 hours as needed    -Continue IV Dilaudid 2 mg as needed   -Start topical diclofenac gel Will likely need further titration of long-acting opioids, further gabapentin titration, and potentially opioid rotation in the future.  # Discharge Planning: Skilled Nursing Facility for rehab with Palliative care service follow-up  -Placed referral for outpatient palliative medicine at [facility].  Discussed with: patient, RN  Thank you for allowing the palliative care team to participate in the care Deirdre Priest.  Alvester Morin, DO Palliative Care Provider PMT # [phone]  If patient remains symptomatic despite maximum doses, please call PMT at [phone]  between 0700 and 1900. Outside of these hours, please call attending, as PMT does not have night coverage.  *Please note that this is a verbal dictation therefore any spelling or grammatical errors are due to the "[employer]agon [employer] One" system interpretation.

## 2023-05-27 NOTE — Progress Notes (Signed)
PROGRESS NOTE  [name] EXB:[id number] DOB: 19-Jun-1961   PCP: Artis Delay, MD  Patient is from: Home  DOA: 05/19/2023 LOS: 7  Chief complaints Chief Complaint  Patient presents with   Palpitations     Brief Narrative / Interim history: 62 year old F with PMH of right breast cancer with chronic right chest and upper back wound and cancer related pain presenting with uncontrolled pain at the chronic wound site.  Patient was on MS Contin and oral Dilaudid at home.  She had worsening drainage and pain with associated tachycardia with heart rate to 140s.  CXR with perihilar and lower lobe atelectasis versus pneumonia and small right pleural effusion.  Patient was started on IV and p.o. pain medication.  Palliative medicine and oncology consulted.  She was also started on IV antibiotics due to concern for wound infection.  Wound culture grew MSSA and diphtheroids.  Antibiotic de-escalated to Ancef and Flagyl.  Patient requires daily wound dressing change.     Subjective: Seen and examined earlier this morning.  No major events overnight of this morning.  Complaining of significant pain over right chest and right upper back from the wound.  No other complaints.  Objective: Vitals:   05/26/23 2052 05/26/23 2211 05/27/23 0439 05/27/23 0916  BP: (!) 87/56 99/64 (!) 101/57 (!) 99/56  Pulse: 96 98 92 80  Resp: 20  18 20   Temp: 98.1 F (36.7 C)  98.1 F (36.7 C) 97.7 F (36.5 C)  TempSrc: Oral  Oral Oral  SpO2: 100%  95% 95%  Weight:      Height:        Examination:  GENERAL: No apparent distress.  Nontoxic. HEENT: MMM.  Vision and hearing grossly intact.  NECK: Supple.  No apparent JVD.  RESP:  No IWOB.  Fair aeration bilaterally. CVS:  RRR. Heart sounds normal.  ABD/GI/GU: BS+. Abd soft, NTND.  MSK/EXT:  Moves extremities. No apparent deformity. No edema.  SKIN: Extensive right chest and right upper back wound measuring about 35 cm vertically and about 50 cm around right  chest wall laterally.  Notable bleeding during dressing change but no signs of infection. NEURO: Awake, alert and oriented appropriately.  No apparent focal neuro deficit. PSYCH: Calm. Normal affect.        Procedures:  None  Microbiology summarized: 8/21-wound culture with MSSA and moderate diphtheroids.  Assessment and plan: Principal Problem:   Intractable pain Active Problems:   Breast cancer, stage 4, right (HCC)   Thrombocytosis   Wound healing, delayed   Medication management   Counseling and coordination of care  Intractable cancer-related pain -Continue p.o. and IV Dilaudid, MS Contin and gabapentin per palliative medicine. -Plan includes p.o. Dilaudid, MS Contin and gabapentin. -Bowel regimen   Chronic chest wall wound, with active infection?  Very extensive wound around right chest measuring about 35 cm x 50 cm as above.  Does not seem very deep.  Reportedly with foul-smelling drainage earlier in the course.  Wound culture with MSSA and diphtheroids but superficial.  No signs of infection on my exam today Patient having worsening drainage from wound and foul-smelling. -Continue antibiotics as previously planned-Ancef and Flagyl in house and Keflex and Flagyl on discharge Wound care  Daily       Comments: Metronidazole gel applied to xeroform gauze [place] # [id number]) and placed over lesion once daily. This  is to be topped with dry gauze and ABD pads and secured. May use mesh briefs with the center  cut to form a "tube top" or circular elastic dressing to secure or use roll gauze to encircle chest and secure with paper tape.     Stage IV breast cancer: Xeloda on hold.  -Outpatient follow-up with oncology, Dr. Bertis Ruddy   Tachycardia: Likely autonomic response to pain and dehydration.  Resolved.   Debility:  -Continue PT/OT  Hypokalemia: Replaced   Anemia; of malignancy: Stable after blood transfusion. Recent Labs    04/07/23 1328 04/17/23 1338 05/08/23 1510  05/19/23 1810 05/20/23 1012 05/21/23 0420 05/21/23 1946 05/22/23 1052 05/23/23 0436 05/26/23 0407  HGB 9.8* 10.2* 9.0* 9.1* 7.5* 6.8* 5.4* 11.6* 11.6* 11.7*  -Continue monitoring  Severe malnutrition Body mass index is 19.57 kg/m. Nutrition Problem: Severe Malnutrition Etiology: chronic illness, cancer and cancer related treatments Signs/Symptoms: percent weight loss, severe muscle depletion, severe fat depletion Percent weight loss: 9.8 % (in 1 month) Interventions: Ensure Enlive (each supplement provides 350kcal and 20 grams of protein), Juven, Refer to RD note for recommendations, Education, Boost Breeze   DVT prophylaxis:  enoxaparin (LOVENOX) injection 40 mg Start: 05/23/23 1000 Place and maintain sequential compression device Start: 05/22/23 0907  Code Status: DNR/DNI Family Communication: None at bedside Level of care: Med-Surg Status is: Inpatient Remains inpatient appropriate because: Safe disposition.    Final disposition: SNF Consultants:  Oncology Palliative medicine  55 minutes with more than 50% spent in reviewing records, counseling patient/family and coordinating care.   Sch Meds:  Scheduled Meds:  acetaminophen  500 mg Oral Q6H WA   diclofenac Sodium  2 g Topical QID   enoxaparin (LOVENOX) injection  40 mg Subcutaneous Daily   feeding supplement  1 Container Oral BID BM   feeding supplement  237 mL Oral BID BM   gabapentin  300 mg Oral TID   morphine  30 mg Oral Q8H   nutrition supplement (JUVEN)  1 packet Oral BID BM   polyethylene glycol  17 g Oral BID   senna  2 tablet Oral BID   sodium chloride flush  3 mL Intravenous Q12H   Continuous Infusions:   ceFAZolin (ANCEF) IV 2 g (05/27/23 0529)   PRN Meds:.acetaminophen **OR** acetaminophen, bisacodyl, diphenhydrAMINE, HYDROmorphone (DILAUDID) injection, HYDROmorphone, ondansetron **OR** ondansetron (ZOFRAN) IV  Antimicrobials: Anti-infectives (From admission, onward)    Start     Dose/Rate  Route Frequency Ordered Stop   05/25/23 0000  metroNIDAZOLE (FLAGYL) 500 MG tablet        500 mg Oral 3 times daily 05/25/23 0927 06/04/23 2359   05/25/23 0000  cephALEXin (KEFLEX) 500 MG capsule        500 mg Oral 4 times daily 05/25/23 0927 06/04/23 2359   05/22/23 1100  ceFAZolin (ANCEF) IVPB 2g/100 mL premix        2 g 200 mL/hr over 30 Minutes Intravenous Every 8 hours 05/22/23 1002     05/21/23 1000  vancomycin (VANCOCIN) IVPB 1000 mg/200 mL premix  Status:  Discontinued        1,000 mg 200 mL/hr over 60 Minutes Intravenous Every 24 hours 05/20/23 0945 05/20/23 0945   05/21/23 1000  vancomycin (VANCOCIN) IVPB 1000 mg/200 mL premix  Status:  Discontinued        1,000 mg 200 mL/hr over 60 Minutes Intravenous Every 24 hours 05/20/23 0946 05/22/23 1002   05/20/23 1030  vancomycin (VANCOREADY) IVPB 1250 mg/250 mL        1,250 mg 166.7 mL/hr over 90 Minutes Intravenous  Once 05/20/23 0944 05/20/23 1230  05/20/23 1000  sulfamethoxazole-trimethoprim (BACTRIM) 400-80 MG per tablet 1 tablet  Status:  Discontinued       Note to Pharmacy: Only after wound culture is sent   1 tablet Oral Every 12 hours 05/20/23 0838 05/20/23 0911   05/20/23 1000  cefTRIAXone (ROCEPHIN) 2 g in sodium chloride 0.9 % 100 mL IVPB  Status:  Discontinued        2 g 200 mL/hr over 30 Minutes Intravenous Every 24 hours 05/20/23 0912 05/22/23 1002   05/20/23 1000  metroNIDAZOLE (FLAGYL) IVPB 500 mg        500 mg 100 mL/hr over 60 Minutes Intravenous Every 12 hours 05/20/23 0912 05/26/23 2204        I have personally reviewed the following labs and images: CBC: Recent Labs  Lab 05/21/23 0420 05/21/23 1946 05/22/23 1052 05/23/23 0436 05/26/23 0407  WBC 5.5  --  6.0 5.3 6.3  HGB 6.8* 5.4* 11.6* 11.6* 11.7*  HCT 21.9* 17.2* 36.1 35.9* 37.4  MCV 86.6  --  88.9 88.6 89.3  PLT 394  --  351 343 365   BMP &GFR Recent Labs  Lab 05/21/23 0420 05/22/23 1052 05/23/23 0436  NA 135 138 138  K 3.4* 3.3* 3.8   CL 107 110 110  CO2 22 21* 21*  GLUCOSE 99 113* 89  BUN 13 12 13   CREATININE 0.79 0.66 0.70  CALCIUM 7.1* 7.0* 7.0*   Estimated Creatinine Clearance: 62.5 mL/min (by C-G formula based on SCr of 0.7 mg/dL). Liver & Pancreas: No results for input(s): "AST", "ALT", "ALKPHOS", "BILITOT", "PROT", "ALBUMIN" in the last 168 hours. No results for input(s): "LIPASE", "AMYLASE" in the last 168 hours. No results for input(s): "AMMONIA" in the last 168 hours. Diabetic: No results for input(s): "HGBA1C" in the last 72 hours. No results for input(s): "GLUCAP" in the last 168 hours. Cardiac Enzymes: No results for input(s): "CKTOTAL", "CKMB", "CKMBINDEX", "TROPONINI" in the last 168 hours. No results for input(s): "PROBNP" in the last 8760 hours. Coagulation Profile: No results for input(s): "INR", "PROTIME" in the last 168 hours. Thyroid Function Tests: No results for input(s): "TSH", "T4TOTAL", "FREET4", "T3FREE", "THYROIDAB" in the last 72 hours. Lipid Profile: No results for input(s): "CHOL", "HDL", "LDLCALC", "TRIG", "CHOLHDL", "LDLDIRECT" in the last 72 hours. Anemia Panel: No results for input(s): "VITAMINB12", "FOLATE", "FERRITIN", "TIBC", "IRON", "RETICCTPCT" in the last 72 hours. Urine analysis:    Component Value Date/Time   COLORURINE YELLOW 08/02/2022 1927   APPEARANCEUR CLEAR 08/02/2022 1927   LABSPEC 1.019 08/02/2022 1927   PHURINE 6.0 08/02/2022 1927   GLUCOSEU NEGATIVE 08/02/2022 1927   HGBUR NEGATIVE 08/02/2022 1927   BILIRUBINUR NEGATIVE 08/02/2022 1927   KETONESUR NEGATIVE 08/02/2022 1927   PROTEINUR NEGATIVE 08/02/2022 1927   NITRITE NEGATIVE 08/02/2022 1927   LEUKOCYTESUR NEGATIVE 08/02/2022 1927   Sepsis Labs: Invalid input(s): "PROCALCITONIN", "LACTICIDVEN"  Microbiology: Recent Results (from the past 240 hour(s))  Aerobic Culture w Gram Stain (superficial specimen)     Status: None   Collection Time: 05/20/23 12:38 PM   Specimen: Breast; Wound  Result  Value Ref Range Status   Specimen Description   Final    BREAST Performed at [facility], [address] [place], [place] [address]    Special Requests   Final    Immunocompromised Performed at [facility], [address], [place], [place] [address]    Gram Stain   Final    NO WBC SEEN RARE GRAM POSITIVE COCCI  IN PAIRS RARE GRAM POSITIVE RODS    Culture   Final    ABUNDANT STAPHYLOCOCCUS AUREUS MODERATE DIPHTHEROIDS(CORYNEBACTERIUM SPECIES) Standardized susceptibility testing for this organism is not available. Performed at [facility], [address] [place], [place] [address]    Report Status 05/22/2023 FINAL  Final   Organism ID, Bacteria STAPHYLOCOCCUS AUREUS  Final      Susceptibility   Staphylococcus aureus - MIC*    CIPROFLOXACIN 1 SENSITIVE Sensitive     ERYTHROMYCIN >=8 RESISTANT Resistant     GENTAMICIN <=0.5 SENSITIVE Sensitive     OXACILLIN 0.5 SENSITIVE Sensitive     TETRACYCLINE <=1 SENSITIVE Sensitive     VANCOMYCIN 1 SENSITIVE Sensitive     TRIMETH/SULFA <=10 SENSITIVE Sensitive     CLINDAMYCIN <=0.25 SENSITIVE Sensitive     RIFAMPIN <=0.5 SENSITIVE Sensitive     Inducible Clindamycin NEGATIVE Sensitive     LINEZOLID 2 SENSITIVE Sensitive     * ABUNDANT STAPHYLOCOCCUS AUREUS    Radiology Studies: No results found.    Aidyn Kellis T. Elai Vanwyk [facility]  If 7PM-7AM, please contact night-coverage [email or web address] 05/27/2023, 12:54 PM

## 2023-05-28 ENCOUNTER — Inpatient Hospital Stay (HOSPITAL_COMMUNITY): Payer: BC Managed Care – PPO

## 2023-05-28 DIAGNOSIS — M7989 Other specified soft tissue disorders: Secondary | ICD-10-CM

## 2023-05-28 DIAGNOSIS — C50911 Malignant neoplasm of unspecified site of right female breast: Secondary | ICD-10-CM | POA: Diagnosis not present

## 2023-05-28 DIAGNOSIS — G893 Neoplasm related pain (acute) (chronic): Secondary | ICD-10-CM | POA: Diagnosis not present

## 2023-05-28 DIAGNOSIS — Z79899 Other long term (current) drug therapy: Secondary | ICD-10-CM | POA: Diagnosis not present

## 2023-05-28 DIAGNOSIS — Z515 Encounter for palliative care: Secondary | ICD-10-CM

## 2023-05-28 DIAGNOSIS — T148XXD Other injury of unspecified body region, subsequent encounter: Secondary | ICD-10-CM | POA: Diagnosis not present

## 2023-05-28 MED ORDER — BOOST / RESOURCE BREEZE PO LIQD CUSTOM: 1.0000 | ORAL | Status: DC

## 2023-05-28 MED ORDER — OXYCODONE HCL 5 MG PO TABS
20.0000 mg | ORAL_TABLET | ORAL | Status: DC | PRN
  Administered 2023-05-28 – 2023-05-29 (×3): 20 mg via ORAL
  Filled 2023-05-28 (×3): qty 4

## 2023-05-28 MED ORDER — ADULT MULTIVITAMIN W/MINERALS CH
1.0000 | ORAL_TABLET | Freq: Every day | ORAL | Status: DC
  Administered 2023-05-29 – 2023-05-30 (×2): 1 via ORAL
  Filled 2023-05-28 (×2): qty 1

## 2023-05-28 MED ORDER — JUVEN PO PACK
1.0000 | PACK | Freq: Two times a day (BID) | ORAL | Status: DC
  Administered 2023-05-28 – 2023-05-29 (×3): 1 via ORAL
  Filled 2023-05-28 (×3): qty 1

## 2023-05-28 MED ORDER — ENSURE ENLIVE PO LIQD
237.0000 mL | Freq: Two times a day (BID) | ORAL | Status: DC
  Administered 2023-05-29 (×2): 237 mL via ORAL

## 2023-05-28 NOTE — Progress Notes (Signed)
Mobility Specialist - Progress Note    05/28/23 1351  Mobility  Activity Ambulated with assistance in hallway  Level of Assistance Modified independent, requires aide device or extra time  Assistive Device Front wheel walker  Distance Ambulated (ft) 700 ft  Range of Motion/Exercises Active  Activity Response Tolerated well  Mobility Referral Yes  $Mobility charge 1 Mobility  Mobility Specialist Start Time (ACUTE ONLY) 1330  Mobility Specialist Stop Time (ACUTE ONLY) 1351  Mobility Specialist Time Calculation (min) (ACUTE ONLY) 21 min   Pt was found in bed and agreeable to ambulate. No complaints with session. Returned to sit EOB with all needs met. Call bell in reach.  Billey Chang Mobility Specialist

## 2023-05-28 NOTE — Progress Notes (Signed)
PROGRESS NOTE  [name] ZOX:[id number] DOB: April 11, 1961   PCP: Artis Delay, MD  Patient is from: Home  DOA: 05/19/2023 LOS: 8  Chief complaints Chief Complaint  Patient presents with   Palpitations     Brief Narrative / Interim history: 62 year old F with PMH of right breast cancer with chronic right chest and upper back wound and cancer related pain presenting with uncontrolled pain at the chronic wound site.  Patient was on MS Contin and oral Dilaudid at home.  She had worsening drainage and pain with associated tachycardia with heart rate to 140s.  CXR with perihilar and lower lobe atelectasis versus pneumonia and small right pleural effusion.  Patient was started on IV and p.o. pain medication.  Palliative medicine and oncology consulted.  She was also started on IV antibiotics due to concern for wound infection.  Wound culture grew MSSA and diphtheroids.  Antibiotic de-escalated to Ancef and Flagyl.  Patient requires skilled nursing for daily for complex wound care that she cannot manage at home.    Subjective: Seen and examined earlier this morning.  No major events overnight of this morning.  No complaints other than usual pain.   Objective: Vitals:   05/27/23 1455 05/27/23 2218 05/28/23 0610 05/28/23 1200  BP: 98/60 (!) 94/53 96/60 (!) 103/48  Pulse: 99 94 94 97  Resp: 18 17 18 18   Temp: 97.9 F (36.6 C) 97.8 F (36.6 C) 97.9 F (36.6 C) 98.3 F (36.8 C)  TempSrc: Oral Oral Oral Oral  SpO2: 98% 93% 98% 97%  Weight:      Height:        Examination:  GENERAL: No apparent distress.  Nontoxic. HEENT: MMM.  Vision and hearing grossly intact.  NECK: Supple.  No apparent JVD.  RESP:  No IWOB.  Fair aeration bilaterally. CVS:  RRR. Heart sounds normal.  ABD/GI/GU: BS+. Abd soft, NTND.  MSK/EXT:  Moves extremities. No apparent deformity. No edema.  SKIN: Bulky and extensive dressing over right chest wall. NEURO: Awake, alert and oriented appropriately.  No  apparent focal neuro deficit. PSYCH: Calm. Normal affect.   Procedures:  None  Microbiology summarized: 8/21-wound culture with MSSA and moderate diphtheroids.  Assessment and plan: Principal Problem:   Intractable pain Active Problems:   Breast cancer, stage 4, right (HCC)   Thrombocytosis   Wound healing, delayed   Medication management   Counseling and coordination of care   High risk medication use  Intractable cancer-related pain -Continue p.o. and IV Dilaudid, MS Contin and gabapentin per palliative medicine. -Plan includes p.o. Dilaudid, MS Contin and gabapentin. -Bowel regimen   Chronic chest wall wound, with active infection?  Very extensive wound around right chest measuring about 35 cm x 50 cm as above.  Does not seem very deep.  Reportedly with foul-smelling drainage earlier in the course.  Wound culture with MSSA and diphtheroids but superficial.  No signs of infection on my exam.  -Continue antibiotics as previously planned through 9/5. Wound care  Daily       Comments: Metronidazole gel applied to xeroform gauze [place] # [id number]) and placed over lesion once daily. This  is to be topped with dry gauze and ABD pads and secured. May use mesh briefs with the center cut to form a "tube top" or circular elastic dressing to secure or use roll gauze to encircle chest and secure with paper tape.  -Patient requires skilled nursing for daily wound dressing change.   Stage IV breast cancer:  Xeloda on hold.  -Outpatient follow-up with oncology, Dr. Bertis Ruddy   Tachycardia: Likely autonomic response to pain and dehydration.  Resolved.   Debility:  -Continue PT/OT  Hypokalemia: Replaced   Anemia; of malignancy: Stable after blood transfusion. Recent Labs    04/07/23 1328 04/17/23 1338 05/08/23 1510 05/19/23 1810 05/20/23 1012 05/21/23 0420 05/21/23 1946 05/22/23 1052 05/23/23 0436 05/26/23 0407  HGB 9.8* 10.2* 9.0* 9.1* 7.5* 6.8* 5.4* 11.6* 11.6* 11.7*  -Continue  monitoring  Severe malnutrition Body mass index is 19.57 kg/m. Nutrition Problem: Severe Malnutrition Etiology: chronic illness, cancer and cancer related treatments Signs/Symptoms: percent weight loss, severe muscle depletion, severe fat depletion Percent weight loss: 9.8 % (in 1 month) Interventions: Ensure Enlive (each supplement provides 350kcal and 20 grams of protein), Juven, Refer to RD note for recommendations, Education, Boost Breeze   DVT prophylaxis:  enoxaparin (LOVENOX) injection 40 mg Start: 05/23/23 1000 Place and maintain sequential compression device Start: 05/22/23 0907  Code Status: DNR/DNI Family Communication: None at bedside Level of care: Med-Surg Status is: Inpatient Remains inpatient appropriate because: Safe disposition/SNF.    Final disposition: SNF Consultants:  Oncology Palliative medicine  35 minutes with more than 50% spent in reviewing records, counseling patient/family and coordinating care.   Sch Meds:  Scheduled Meds:  acetaminophen  500 mg Oral Q6H WA   diclofenac Sodium  2 g Topical QID   enoxaparin (LOVENOX) injection  40 mg Subcutaneous Daily   feeding supplement  1 Container Oral BID BM   feeding supplement  237 mL Oral BID BM   gabapentin  300 mg Oral TID   morphine  30 mg Oral Q8H   nutrition supplement (JUVEN)  1 packet Oral BID BM   polyethylene glycol  17 g Oral BID   senna  2 tablet Oral BID   sodium chloride flush  3 mL Intravenous Q12H   Continuous Infusions:   ceFAZolin (ANCEF) IV 2 g (05/28/23 0553)   PRN Meds:.acetaminophen **OR** acetaminophen, bisacodyl, diphenhydrAMINE, HYDROmorphone (DILAUDID) injection, HYDROmorphone, ondansetron **OR** ondansetron (ZOFRAN) IV  Antimicrobials: Anti-infectives (From admission, onward)    Start     Dose/Rate Route Frequency Ordered Stop   05/25/23 0000  metroNIDAZOLE (FLAGYL) 500 MG tablet        500 mg Oral 3 times daily 05/25/23 0927 06/04/23 2359   05/25/23 0000   cephALEXin (KEFLEX) 500 MG capsule        500 mg Oral 4 times daily 05/25/23 0927 06/04/23 2359   05/22/23 1100  ceFAZolin (ANCEF) IVPB 2g/100 mL premix        2 g 200 mL/hr over 30 Minutes Intravenous Every 8 hours 05/22/23 1002     05/21/23 1000  vancomycin (VANCOCIN) IVPB 1000 mg/200 mL premix  Status:  Discontinued        1,000 mg 200 mL/hr over 60 Minutes Intravenous Every 24 hours 05/20/23 0945 05/20/23 0945   05/21/23 1000  vancomycin (VANCOCIN) IVPB 1000 mg/200 mL premix  Status:  Discontinued        1,000 mg 200 mL/hr over 60 Minutes Intravenous Every 24 hours 05/20/23 0946 05/22/23 1002   05/20/23 1030  vancomycin (VANCOREADY) IVPB 1250 mg/250 mL        1,250 mg 166.7 mL/hr over 90 Minutes Intravenous  Once 05/20/23 0944 05/20/23 1230   05/20/23 1000  sulfamethoxazole-trimethoprim (BACTRIM) 400-80 MG per tablet 1 tablet  Status:  Discontinued       Note to Pharmacy: Only after wound culture is sent  1 tablet Oral Every 12 hours 05/20/23 0838 05/20/23 0911   05/20/23 1000  cefTRIAXone (ROCEPHIN) 2 g in sodium chloride 0.9 % 100 mL IVPB  Status:  Discontinued        2 g 200 mL/hr over 30 Minutes Intravenous Every 24 hours 05/20/23 0912 05/22/23 1002   05/20/23 1000  metroNIDAZOLE (FLAGYL) IVPB 500 mg        500 mg 100 mL/hr over 60 Minutes Intravenous Every 12 hours 05/20/23 0912 05/26/23 2204        I have personally reviewed the following labs and images: CBC: Recent Labs  Lab 05/21/23 1946 05/22/23 1052 05/23/23 0436 05/26/23 0407  WBC  --  6.0 5.3 6.3  HGB 5.4* 11.6* 11.6* 11.7*  HCT 17.2* 36.1 35.9* 37.4  MCV  --  88.9 88.6 89.3  PLT  --  351 343 365   BMP &GFR Recent Labs  Lab 05/22/23 1052 05/23/23 0436  NA 138 138  K 3.3* 3.8  CL 110 110  CO2 21* 21*  GLUCOSE 113* 89  BUN 12 13  CREATININE 0.66 0.70  CALCIUM 7.0* 7.0*   Estimated Creatinine Clearance: 62.5 mL/min (by C-G formula based on SCr of 0.7 mg/dL). Liver & Pancreas: No results for  input(s): "AST", "ALT", "ALKPHOS", "BILITOT", "PROT", "ALBUMIN" in the last 168 hours. No results for input(s): "LIPASE", "AMYLASE" in the last 168 hours. No results for input(s): "AMMONIA" in the last 168 hours. Diabetic: No results for input(s): "HGBA1C" in the last 72 hours. No results for input(s): "GLUCAP" in the last 168 hours. Cardiac Enzymes: No results for input(s): "CKTOTAL", "CKMB", "CKMBINDEX", "TROPONINI" in the last 168 hours. No results for input(s): "PROBNP" in the last 8760 hours. Coagulation Profile: No results for input(s): "INR", "PROTIME" in the last 168 hours. Thyroid Function Tests: No results for input(s): "TSH", "T4TOTAL", "FREET4", "T3FREE", "THYROIDAB" in the last 72 hours. Lipid Profile: No results for input(s): "CHOL", "HDL", "LDLCALC", "TRIG", "CHOLHDL", "LDLDIRECT" in the last 72 hours. Anemia Panel: No results for input(s): "VITAMINB12", "FOLATE", "FERRITIN", "TIBC", "IRON", "RETICCTPCT" in the last 72 hours. Urine analysis:    Component Value Date/Time   COLORURINE YELLOW 08/02/2022 1927   APPEARANCEUR CLEAR 08/02/2022 1927   LABSPEC 1.019 08/02/2022 1927   PHURINE 6.0 08/02/2022 1927   GLUCOSEU NEGATIVE 08/02/2022 1927   HGBUR NEGATIVE 08/02/2022 1927   BILIRUBINUR NEGATIVE 08/02/2022 1927   KETONESUR NEGATIVE 08/02/2022 1927   PROTEINUR NEGATIVE 08/02/2022 1927   NITRITE NEGATIVE 08/02/2022 1927   LEUKOCYTESUR NEGATIVE 08/02/2022 1927   Sepsis Labs: Invalid input(s): "PROCALCITONIN", "LACTICIDVEN"  Microbiology: Recent Results (from the past 240 hour(s))  Aerobic Culture w Gram Stain (superficial specimen)     Status: None   Collection Time: 05/20/23 12:38 PM   Specimen: Breast; Wound  Result Value Ref Range Status   Specimen Description   Final    BREAST Performed at [facility], [address] [place], [place] [address]    Special Requests   Final    Immunocompromised Performed at [facility],  [address], [place], [place] [address]    Gram Stain   Final    NO WBC SEEN RARE GRAM POSITIVE COCCI IN PAIRS RARE GRAM POSITIVE RODS    Culture   Final    ABUNDANT STAPHYLOCOCCUS AUREUS MODERATE DIPHTHEROIDS(CORYNEBACTERIUM SPECIES) Standardized susceptibility testing for this organism is not available. Performed at [facility], [address] [place], [place] [address]    Report Status  05/22/2023 FINAL  Final   Organism ID, Bacteria STAPHYLOCOCCUS AUREUS  Final      Susceptibility   Staphylococcus aureus - MIC*    CIPROFLOXACIN 1 SENSITIVE Sensitive     ERYTHROMYCIN >=8 RESISTANT Resistant     GENTAMICIN <=0.5 SENSITIVE Sensitive     OXACILLIN 0.5 SENSITIVE Sensitive     TETRACYCLINE <=1 SENSITIVE Sensitive     VANCOMYCIN 1 SENSITIVE Sensitive     TRIMETH/SULFA <=10 SENSITIVE Sensitive     CLINDAMYCIN <=0.25 SENSITIVE Sensitive     RIFAMPIN <=0.5 SENSITIVE Sensitive     Inducible Clindamycin NEGATIVE Sensitive     LINEZOLID 2 SENSITIVE Sensitive     * ABUNDANT STAPHYLOCOCCUS AUREUS    Radiology Studies: VAS [place] LOWER EXTREMITY VENOUS (DVT)  Result Date: 05/28/2023  Lower Venous DVT Study Patient Name:  [name]  Date of Exam:   05/28/2023 Medical Rec #: [id number]        Accession #:    [id number] Date of Birth: July 08, 1961         Patient Gender: F Patient Age:   61 years Exam Location:  [facility] Procedure:      VAS [place] LOWER EXTREMITY VENOUS (DVT) Referring Phys: Jon Billings REGALADO --------------------------------------------------------------------------------  Indications: Swelling.  Risk Factors: Cancer. Limitations: Poor ultrasound/tissue interface. Comparison Study: 05/20/2023 - RIGHT:                   - There is no evidence of deep vein thrombosis in the lower                   extremity.                    - No cystic structure found in the popliteal fossa.                    LEFT:                   - There is no evidence of deep vein thrombosis in  the lower                   extremity.                    - No cystic structure found in the popliteal fossa. Performing Technologist: Chanda Busing RVT  Examination Guidelines: A complete evaluation includes B-mode imaging, spectral Doppler, color Doppler, and power Doppler as needed of all accessible portions of each vessel. Bilateral testing is considered an integral part of a complete examination. Limited examinations for reoccurring indications may be performed as noted. The reflux portion of the exam is performed with the patient in reverse Trendelenburg.  +---------+---------------+---------+-----------+----------+--------------+ RIGHT    CompressibilityPhasicitySpontaneityPropertiesThrombus Aging +---------+---------------+---------+-----------+----------+--------------+ CFV      Full           Yes      Yes                                 +---------+---------------+---------+-----------+----------+--------------+ SFJ      Full                                                        +---------+---------------+---------+-----------+----------+--------------+ FV Prox  Full                                                        +---------+---------------+---------+-----------+----------+--------------+ FV Mid   Full                                                        +---------+---------------+---------+-----------+----------+--------------+ FV DistalFull                                                        +---------+---------------+---------+-----------+----------+--------------+ PFV      Full                                                        +---------+---------------+---------+-----------+----------+--------------+ POP      Full           Yes      Yes                                 +---------+---------------+---------+-----------+----------+--------------+ PTV      Full                                                         +---------+---------------+---------+-----------+----------+--------------+ PERO     Full                                                        +---------+---------------+---------+-----------+----------+--------------+   +----+---------------+---------+-----------+----------+--------------+ LEFTCompressibilityPhasicitySpontaneityPropertiesThrombus Aging +----+---------------+---------+-----------+----------+--------------+ CFV Full           Yes      Yes                                 +----+---------------+---------+-----------+----------+--------------+    Summary: RIGHT: - There is no evidence of deep vein thrombosis in the lower extremity.  - No cystic structure found in the popliteal fossa.  LEFT: - No evidence of common femoral vein obstruction.   *See table(s) above for measurements and observations.    Preliminary       Rosendo Couser T. Rick Carruthers [facility]  If 7PM-7AM, please contact night-coverage [email or web address] 05/28/2023, 12:45 PM

## 2023-05-28 NOTE — Progress Notes (Signed)
Patient right breast and back dressing changed. ABD and and xeroform removed from wound. Wound cleansed with normal saline. Red and pink drainage seeping from upper mid sternal area. Wounds around flank back area pink without drainage. Xeroform placed on open area. ABD pads placed over Xeroform along with Medipore tape.

## 2023-05-28 NOTE — Progress Notes (Signed)
Right lower extremity venous duplex has been completed. Preliminary results can be found in CV Proc through chart review.   05/28/23 9:53 AM Olen Cordial RVT

## 2023-05-28 NOTE — Progress Notes (Signed)
Daily Progress Note   Patient Name: [name]       Date: 05/28/2023 DOB: 1961/01/20  Age: 62 y.o. MRN#: [id number] Attending Physician: Almon Hercules, MD Primary Care Physician: Artis Delay, MD Admit Date: 05/19/2023 Length of Stay: 8 days  Reason for Consultation/Follow-up: Pain control  Subjective:   CC: Patient notes pain not improved with medication adjustments yesterday. Following up regarding pain management.   Subjective:  Reviewed EMR prior to presenting to bedside.  At time of EMR review patient had received IV Dilaudid 2 mg x 2 doses and no doses of as needed oral Dilaudid.  Patient continues to receive MS Contin 30 mg every 8 hours scheduled during the day.  Had scheduled patient on gabapentin 3 mg 3 times daily and diclofenac gel starting on 05/27/2023.  Presents to bedside to meet with patient.  Patient does not feel that the opioid adjustments are assisting with her pain management.  Patient cannot differentiate if there are any improvements from gabapentin at this time.  Discussed continuing current gabapentin dose though may need to further titrate in the future.  Upon examination today patient is particularly describing her pain on the right upper side of her chest around the area of her lateral right rib radiating inward in a dermatomal fashion.  Inquired if patient had ever undergone nerve block to assist with pain management and she denied that she had.  Noted would reach out to IR to determine if this would be an option, such as an intercostal nerve block.  Concerned patient not receiving pain benefits from opioids and at some point will instead have adverse effects from these medications.  We did discuss that since oral Dilaudid with increased dose is not improving the pain, worth considering opioid patient to oral oxycodone.  Based on oral Dilaudid dosing with appropriate dose reduction, will start patient on oxycodone 20 mg every 4 hours as needed.  Patient agreeing with  this plan.  Noted palliative medicine team continue to follow with patient's medical journey.  Discussed care with IDT including hospitalist, RN, and interventional radiology.  Review of Systems Pain in chest from metastatic breast cancer Objective:   Vital Signs:  BP 96/60 (BP Location: Left Arm)   Pulse 94   Temp 97.9 F (36.6 C) (Oral)   Resp 18   Ht 5\' 6"  (1.676 m)   Wt 55 kg   SpO2 98%   BMI 19.57 kg/m   Physical Exam: General: NAD, alert, laying in bed, chronically ill-appearing, frail Eyes: No drainage noted HENT: moist mucous membranes Cardiovascular: RRR, Respiratory: no increased work of breathing noted, not in respiratory distress Abdomen: not distended Neuro: A&Ox4, following commands easily Psych: appropriately answers all questions  Imaging:  I personally reviewed recent imaging.   Assessment & Plan:   Assessment: Patient is a 62 year old female with a past medical history of metastatic right-sided breast cancer with ongoing weight loss and chest wall and back wounds who was admitted on 05/19/2023 from cancer center for cancer associated pain and chest wall wounds that were not improving.  Palliative medicine team consulted for pain management.  Recommendations/Plan:  # Symptom management:  -Pain, acute on chronic in the setting of metastatic breast cancer with chest wall and back wounds   -Continue MS Contin 30 mg every 8 hours scheduled   -Continue gabapentin to 300 mg 3 times daily   -Discontinue oral Dilaudid since not having any noted improvement with increased dose   -Start oral oxycodone 20  mg every 4 hours as needed for opioid rotation from oral Dilaudid   -Continue IV Dilaudid 2 mg as needed   -Continue topical diclofenac gel   -Reached out to IR about consideration of nerve block, such as intercostal nerve block based on where patient describing her pain during examination in a dermatomal fashion around her fifth rib on the right side.   #  Discharge Planning: Skilled Nursing Facility for rehab with Palliative care service follow-up  -Placed referral for outpatient palliative medicine at [facility].  Discussed with: patient, RN, hospitalist, IR  Thank you for allowing the palliative care team to participate in the care Deirdre Priest.  Alvester Morin, DO Palliative Care Provider PMT # [phone]  If patient remains symptomatic despite maximum doses, please call PMT at [phone] between 0700 and 1900. Outside of these hours, please call attending, as PMT does not have night coverage.  *Please note that this is a verbal dictation therefore any spelling or grammatical errors are due to the "[employer]agon [employer] One" system interpretation.

## 2023-05-28 NOTE — Progress Notes (Signed)
Nutrition Follow-up  DOCUMENTATION CODES:   Severe malnutrition in context of chronic illness  INTERVENTION:  - Regular.   - Ensure Plus High Protein po BID, each supplement provides 350 kcal and 20 grams of protein. - Boost Breeze po once daily, provides 250 kcal and 9 grams of protein - 1 packet Juven BID, each packet provides 95 calories, 2.5 grams of protein (collagen), and 9.8 grams of carbohydrate (3 grams sugar); also contains 7 grams of L-arginine and L-glutamine, 300 mg vitamin C, 15 mg vitamin E, 1.2 mcg vitamin B-12, 9.5 mg zinc, 200 mg calcium, and 1.5 g  Calcium Beta-hydroxy-Beta-methylbutyrate to support wound healing  - Add multivitamin with minerals daily.  - Monitor weight trends.   NUTRITION DIAGNOSIS:   Severe Malnutrition related to chronic illness, cancer and cancer related treatments as evidenced by percent weight loss, severe muscle depletion, severe fat depletion. *ongoing  GOAL:   Patient will meet greater than or equal to 90% of their needs *progressing  MONITOR:   Weight trends, Supplement acceptance, PO intake, Skin, I & O's, Labs  REASON FOR ASSESSMENT:   Consult Wound healing  ASSESSMENT:   62 y.o. female with PMHx including breast cancer, back and R chest wounds, chronic pain 2/2 cancer who presents with uncontrolled pain related to R chest wounds  Patient reports ordering 3 meals a day but not able to eat 100% of all meals. Usually eating around 50%. She is documented to be consuming an average of 43% of meals over the past week.  She admits her appetite is decreased but she has been trying to eat well to promote healing.  She enjoys the Ensure and Boost. Notes she really likes Boost but understands the Ensure has more protein and calories so she would like the have Ensure twice daily and Boost Breeze only once. She doesn't love the [place] but understands it helps with wound healing so agreeable to continue to receive.   Medications reviewed  and include: Miralax, Senokot  Labs reviewed:  -   Diet Order:   Diet Order             Diet - low sodium heart healthy           Diet regular Room service appropriate? Yes; Fluid consistency: Thin  Diet effective now                   EDUCATION NEEDS:  Education needs have been addressed  Skin:  Skin Assessment: Skin Integrity Issues: Skin Integrity Issues:: Other (Comment) Other: R breast nonhealing wound  Last BM:  8/27  Height:  Ht Readings from Last 1 Encounters:  05/19/23 5\' 6"  (1.676 m)   Weight:  Wt Readings from Last 1 Encounters:  05/19/23 55 kg    BMI:  Body mass index is 19.57 kg/m.  Estimated Nutritional Needs:  Kcal:  1610-9604 Protein:  65-85 g Fluid:  > 1.7L    Shelle Iron RD, LDN For contact information, refer to [facility]

## 2023-05-28 NOTE — Progress Notes (Signed)
Physical Therapy Treatment Patient Details Name: [name] MRN: [id number] DOB: [birth year] Today's Date: 05/28/2023   History of Present Illness 62 year old with past medical history significant for right breast cancer, with chronic right chest and back wounds, chronic cancer-related pain presents with uncontrolled pain at the site of her chronic wounds on 05/19/23.  She has been taking MS Contin and oral Dilaudid at home as prescribed.  She has been noticed to have worsening drainage from her wounds.  She was also noted to be tachycardic with heart rates in the 140s.  Chest x-ray perihilar and lower lobe atelectasis versus pneumonia with a small right pleural effusion. PMH: HTN, ankle surgery, tachycardia.    PT Comments  Pt seen for PT tx with pt agreeable. Pt is making good progress with mobility, ambulating around unit with RW & mod I although pt reports she is ambulatory without AD prior to admission. Pt reports she either walks or crawls up stairs at home depending on her strength levels & only has half bath downstairs with fulll bathroom & bedroom upstairs. PT educated pt on ability to sleep on couch & sponge bathe if unable to safely negotiate stairs. Pt does practice stair negotiation with pt negotiating 6 steps with R rail to simulate home environment with supervision overall & pt noting fatigue afterwards. Pt would benefit from continued skilled PT treatment to trial gait with SPC or no AD to facilitate return to PLOF & for further stair training & strengthening while in acute setting. PT reviewed goals & updated them as appropriate.   If plan is discharge home, recommend the following: A little help with bathing/dressing/bathroom;Assistance with cooking/housework;Help with stairs or ramp for entrance   Can travel by private vehicle     Yes  Equipment Recommendations  Rolling walker (2 wheels)    Recommendations for Other Services       Precautions / Restrictions  Precautions Precautions: Fall Precaution Comments: denies falls in past 6 months;  wounds R upper chest and back Restrictions Weight Bearing Restrictions: No     Mobility  Bed Mobility Overal bed mobility: Modified Independent Bed Mobility: Supine to Sit     Supine to sit: Used rails, HOB elevated, Modified independent (Device/Increase time)     General bed mobility comments: extra time, HOB slightly elevated    Transfers Overall transfer level: Needs assistance Equipment used: Rolling walker (2 wheels) Transfers: Sit to/from Stand Sit to Stand: Supervision           General transfer comment: STS from EOB    Ambulation/Gait Ambulation/Gait assistance: Modified independent (Device/Increase time) Gait Distance (Feet):  (1 lap around unit >200 ft) Assistive device: Rolling walker (2 wheels)   Gait velocity: slightly decreased     General Gait Details: steady   Stairs Stairs: Yes Stairs assistance: Supervision Stair Management: One rail Right Number of Stairs: 6 (6") General stair comments: Step to, R rail to simulate home environment   Wheelchair Mobility     Tilt Bed    Modified Rankin (Stroke Patients Only)       Balance Overall balance assessment: Modified Independent Sitting-balance support: No upper extremity supported Sitting balance-Leahy Scale: Good Sitting balance - Comments: pt able to don shoes sitting EOB, no LOB but appears to sacral sit/slump   Standing balance support: Bilateral upper extremity supported, During functional activity, Reliant on assistive device for balance Standing balance-Leahy Scale: Good  Cognition Arousal: Alert Behavior During Therapy: WFL for tasks assessed/performed Overall Cognitive Status: Within Functional Limits for tasks assessed                                 General Comments: Pleasant, motivated, smiling/laughing        Exercises       General Comments General comments (skin integrity, edema, etc.): Pt's back dressing saturated - nurse made aware.      Pertinent Vitals/Pain Pain Assessment Pain Assessment: No/denies pain (no reports of pain during session)    Home Living                          Prior Function            PT Goals (current goals can now be found in the care plan section) Acute Rehab PT Goals Patient Stated Goal: church, community work PT Goal Formulation: With patient Time For Goal Achievement: 06/04/23 Potential to Achieve Goals: Fair Progress towards PT goals: Progressing toward goals    Frequency    Min 1X/week      PT Plan      Co-evaluation              AM-PAC PT "6 Clicks" Mobility   Outcome Measure  Help needed turning from your back to your side while in a flat bed without using bedrails?: None Help needed moving from lying on your back to sitting on the side of a flat bed without using bedrails?: None Help needed moving to and from a bed to a chair (including a wheelchair)?: None Help needed standing up from a chair using your arms (e.g., wheelchair or bedside chair)?: A Little Help needed to walk in hospital room?: None Help needed climbing 3-5 steps with a railing? : A Little 6 Click Score: 22    End of Session   Activity Tolerance: Patient tolerated treatment well Patient left: in chair;with chair alarm set;with call bell/phone within reach Nurse Communication: Mobility status PT Visit Diagnosis: Difficulty in walking, not elsewhere classified (R26.2);Muscle weakness (generalized) (M62.81)     Time: 7829-5621 PT Time Calculation (min) (ACUTE ONLY): 15 min  Charges:    $Therapeutic Activity: 8-22 mins PT General Charges $$ ACUTE PT VISIT: 1 Visit                     Aleda Grana, PT, DPT 05/28/23, 11:22 AM   Sandi Mariscal 05/28/2023, 11:19 AM

## 2023-05-28 NOTE — TOC Progression Note (Addendum)
Transition of Care [facility]) - Progression Note    Patient Details  Name: Deah Otsuka MRN: [id number] Date of Birth: July 06, 1961  [facility]) CM/SW Contact  Larrie Kass, LCSW Phone Number: 05/28/2023, 9:27 AM  Clinical Narrative:     Received a call from [facility], they are requesting notes stating that the patient is receiving dressing changes here in the hospital. TOC to follow.   Adden 12:00pm  CSW spoke with pt's mother Alanda Amass, she expressed her concerns about pt's insurance authorization. CSW provided an update on pt's insurance authorization. CSW was informed pt has refused dressing changes. Pt's mother stated she did not refuse she had requested a female instead of a female. Pt's mother has requested to speak with the charge nurse, CSW provided the number to the floor, for pt's mother to speak with floor administration. CSW informed pt's mother it is up to the insurance company to approve or deny insurance. Pt's mother inquired if pt is able to afford private duty. Pt's mother would like more information on private duty care if pt's Berkley Harvey is denied. Pt's mother plans to appeal the decision if the final decision is a denial. TOC to follow.   Adden  3:40pm CSW faxed additional clinicals to pt's insurance company. TOC to follow.     Expected Discharge Plan: Skilled Nursing Facility Barriers to Discharge: Insurance Authorization  Expected Discharge Plan and Services In-house Referral: Clinical Social Work     Living arrangements for the past 2 months: Apartment Expected Discharge Date: 05/25/23                                     Social Determinants of Health (SDOH) Interventions SDOH Screenings   Food Insecurity: No Food Insecurity (05/20/2023)  Housing: Low Risk  (05/20/2023)  Transportation Needs: No Transportation Needs (05/20/2023)  Utilities: Not At Risk (05/20/2023)  Financial Resource Strain: Low Risk  (04/03/2022)  Tobacco Use: Low Risk   (05/19/2023)    Readmission Risk Interventions    08/30/2022    1:46 PM 08/06/2022   10:01 AM  Readmission Risk Prevention Plan  Transportation Screening Complete   PCP or Specialist Appt within 3-5 Days Complete   HRI or Home Care Consult Complete   Social Work Consult for Recovery Care Planning/Counseling Complete   Palliative Care Screening Not Applicable   Medication Review Oceanographer) Complete Complete  PCP or Specialist appointment within 3-5 days of discharge  Complete  HRI or Home Care Consult  Complete  SW Recovery Care/Counseling Consult  Complete  Palliative Care Screening  Not Applicable  Skilled Nursing Facility  Complete

## 2023-05-29 ENCOUNTER — Other Ambulatory Visit (HOSPITAL_COMMUNITY): Payer: Self-pay

## 2023-05-29 DIAGNOSIS — C50911 Malignant neoplasm of unspecified site of right female breast: Secondary | ICD-10-CM | POA: Diagnosis not present

## 2023-05-29 DIAGNOSIS — T148XXD Other injury of unspecified body region, subsequent encounter: Secondary | ICD-10-CM | POA: Diagnosis not present

## 2023-05-29 DIAGNOSIS — Z515 Encounter for palliative care: Secondary | ICD-10-CM | POA: Diagnosis not present

## 2023-05-29 DIAGNOSIS — G893 Neoplasm related pain (acute) (chronic): Secondary | ICD-10-CM | POA: Diagnosis not present

## 2023-05-29 LAB — VARICELLA ZOSTER ANTIBODY, IGM: Varicella-Zoster Ab, IgM: <0.91 {index} (ref 0.00–0.90)

## 2023-05-29 MED ORDER — OXYCODONE HCL 5 MG PO TABS
15.0000 mg | ORAL_TABLET | ORAL | Status: DC | PRN
  Administered 2023-05-29 – 2023-05-30 (×2): 20 mg via ORAL
  Filled 2023-05-29 (×2): qty 4

## 2023-05-29 NOTE — TOC Progression Note (Addendum)
Transition of Care [facility]) - Progression Note    Patient Details  Name: [name] MRN: [id number] Date of Birth: November 13, 1960  [facility] CM/SW Contact  Larrie Kass, LCSW Phone Number: 05/29/2023, 10:00 AM  Clinical Narrative:     Followed up with [employer], insurance auth for short term rehab still pending. Per [employer] they have up to 7 days to review additional clinical to make a determination. TOC to follow.  Expected Discharge Plan: Skilled Nursing Facility Barriers to Discharge: Insurance Authorization  Expected Discharge Plan and Services In-house Referral: Clinical Social Work     Living arrangements for the past 2 months: Apartment Expected Discharge Date: 05/25/23                                     Social Determinants of Health (SDOH) Interventions SDOH Screenings   Food Insecurity: No Food Insecurity (05/20/2023)  Housing: Low Risk  (05/20/2023)  Transportation Needs: No Transportation Needs (05/20/2023)  Utilities: Not At Risk (05/20/2023)  Financial Resource Strain: Low Risk  (04/03/2022)  Tobacco Use: Low Risk  (05/19/2023)    Readmission Risk Interventions    08/30/2022    1:46 PM 08/06/2022   10:01 AM  Readmission Risk Prevention Plan  Transportation Screening Complete   PCP or Specialist Appt within 3-5 Days Complete   HRI or Home Care Consult Complete   Social Work Consult for Recovery Care Planning/Counseling Complete   Palliative Care Screening Not Applicable   Medication Review Oceanographer) Complete Complete  PCP or Specialist appointment within 3-5 days of discharge  Complete  HRI or Home Care Consult  Complete  SW Recovery Care/Counseling Consult  Complete  Palliative Care Screening  Not Applicable  Skilled Nursing Facility  Complete

## 2023-05-29 NOTE — Plan of Care (Signed)

## 2023-05-29 NOTE — Progress Notes (Signed)
PROGRESS NOTE  [name] HYQ:[id number] DOB: 1961/05/01   PCP: Artis Delay, MD  Patient is from: Home  DOA: 05/19/2023 LOS: 9  Chief complaints Chief Complaint  Patient presents with   Palpitations     Brief Narrative / Interim history: 62 year old F with PMH of right breast cancer with chronic right chest and upper back wound and cancer related pain presenting with uncontrolled pain at the chronic wound site.  Patient was on MS Contin and oral Dilaudid at home.  She had worsening drainage and pain with associated tachycardia with heart rate to 140s.  CXR with perihilar and lower lobe atelectasis versus pneumonia and small right pleural effusion.  Patient was started on IV and p.o. pain medication.  Palliative medicine and oncology consulted.  She was also started on IV antibiotics due to concern for wound infection.  Wound culture grew MSSA and diphtheroids.  Antibiotic de-escalated to Ancef and Flagyl.  Patient requires skilled nursing for daily for complex wound care that she cannot manage at home.    Subjective: Seen and examined earlier this morning.  No major events overnight of this morning.  No complaints other than usual pain.   Objective: Vitals:   05/28/23 1200 05/28/23 2059 05/29/23 0611 05/29/23 1219  BP: (!) 103/48 94/67 (!) 97/59 (!) 97/58  Pulse: 97 93 89 92  Resp: 18 20 18 16   Temp: 98.3 F (36.8 C) 98.4 F (36.9 C) 98.7 F (37.1 C) 97.8 F (36.6 C)  TempSrc: Oral Oral Oral Oral  SpO2: 97% 98% 97% 97%  Weight:      Height:        Examination:  GENERAL: No apparent distress.  Nontoxic. HEENT: MMM.  Vision and hearing grossly intact.  NECK: Supple.  No apparent JVD.  RESP:  No IWOB.  Fair aeration bilaterally. CVS:  RRR. Heart sounds normal.  ABD/GI/GU: BS+. Abd soft, NTND.  MSK/EXT:  Moves extremities. No apparent deformity. No edema.  SKIN: Bulky and extensive dressing over right chest wall. NEURO: Awake, alert and oriented appropriately.  No  apparent focal neuro deficit. PSYCH: Calm. Normal affect.   Procedures:  None  Microbiology summarized: 8/21-wound culture with MSSA and moderate diphtheroids.  Assessment and plan: Principal Problem:   Intractable pain Active Problems:   Breast cancer, stage 4, right (HCC)   Thrombocytosis   Wound healing, delayed   Medication management   Counseling and coordination of care   High risk medication use  Intractable cancer-related pain -Continue p.o. and IV Dilaudid, MS Contin and gabapentin per palliative medicine. -Plan includes p.o. Dilaudid, MS Contin and gabapentin. -IR consulted for possible nerve block.  Per IR, this might not be done until Thursday, 9/5.  Likely outpatient if discharged. -Bowel regimen   Chronic chest wall wound, with active infection?  Very extensive wound around right chest measuring about 35 cm x 50 cm as above.  Does not seem very deep.  Reportedly with foul-smelling drainage earlier in the course.  Wound culture with MSSA and diphtheroids but superficial.  No signs of infection on my exam.  -Continue antibiotics as previously planned through 9/5. Wound care  Daily       Comments: Metronidazole gel applied to xeroform gauze [place] # [id number]) and placed over lesion once daily. This  is to be topped with dry gauze and ABD pads and secured. May use mesh briefs with the center cut to form a "tube top" or circular elastic dressing to secure or use roll gauze to  encircle chest and secure with paper tape.  -Patient requires skilled nursing for daily wound dressing change. -Will check for VZV and HSV   Stage IV breast cancer: Xeloda on hold.  -Outpatient follow-up with oncology, Dr. Bertis Ruddy   Tachycardia: Likely autonomic response to pain and dehydration.  Resolved.   Debility:  -Continue PT/OT  Hypokalemia: Replaced   Anemia; of malignancy: Stable after blood transfusion. Recent Labs    04/07/23 1328 04/17/23 1338 05/08/23 1510 05/19/23 1810  05/20/23 1012 05/21/23 0420 05/21/23 1946 05/22/23 1052 05/23/23 0436 05/26/23 0407  HGB 9.8* 10.2* 9.0* 9.1* 7.5* 6.8* 5.4* 11.6* 11.6* 11.7*  -Continue monitoring  Severe malnutrition Body mass index is 19.57 kg/m. Nutrition Problem: Severe Malnutrition Etiology: chronic illness, cancer and cancer related treatments Signs/Symptoms: percent weight loss, severe muscle depletion, severe fat depletion Percent weight loss: 9.8 % (in 1 month) Interventions: Ensure Enlive (each supplement provides 350kcal and 20 grams of protein), Juven, Refer to RD note for recommendations, Education, Boost Breeze   DVT prophylaxis:  enoxaparin (LOVENOX) injection 40 mg Start: 05/23/23 1000 Place and maintain sequential compression device Start: 05/22/23 0907  Code Status: DNR/DNI Family Communication: Attempted to call patient's mother from patient's phone and work phone but no answer. Level of care: Med-Surg Status is: Inpatient Remains inpatient appropriate because: Safe disposition/SNF.    Final disposition: SNF Consultants:  Oncology Palliative medicine  35 minutes with more than 50% spent in reviewing records, counseling patient/family and coordinating care.   Sch Meds:  Scheduled Meds:  acetaminophen  500 mg Oral Q6H WA   diclofenac Sodium  2 g Topical QID   enoxaparin (LOVENOX) injection  40 mg Subcutaneous Daily   feeding supplement  1 Container Oral Q24H   feeding supplement  237 mL Oral BID BM   gabapentin  300 mg Oral TID   morphine  30 mg Oral Q8H   multivitamin with minerals  1 tablet Oral Daily   nutrition supplement (JUVEN)  1 packet Oral BID BM   polyethylene glycol  17 g Oral BID   senna  2 tablet Oral BID   sodium chloride flush  3 mL Intravenous Q12H   Continuous Infusions:   ceFAZolin (ANCEF) IV 2 g (05/29/23 1332)   PRN Meds:.acetaminophen **OR** acetaminophen, bisacodyl, diphenhydrAMINE, HYDROmorphone (DILAUDID) injection, ondansetron **OR** ondansetron  (ZOFRAN) IV, oxyCODONE  Antimicrobials: Anti-infectives (From admission, onward)    Start     Dose/Rate Route Frequency Ordered Stop   05/25/23 0000  metroNIDAZOLE (FLAGYL) 500 MG tablet        500 mg Oral 3 times daily 05/25/23 0927 06/04/23 2359   05/25/23 0000  cephALEXin (KEFLEX) 500 MG capsule        500 mg Oral 4 times daily 05/25/23 0927 06/04/23 2359   05/22/23 1100  ceFAZolin (ANCEF) IVPB 2g/100 mL premix        2 g 200 mL/hr over 30 Minutes Intravenous Every 8 hours 05/22/23 1002     05/21/23 1000  vancomycin (VANCOCIN) IVPB 1000 mg/200 mL premix  Status:  Discontinued        1,000 mg 200 mL/hr over 60 Minutes Intravenous Every 24 hours 05/20/23 0945 05/20/23 0945   05/21/23 1000  vancomycin (VANCOCIN) IVPB 1000 mg/200 mL premix  Status:  Discontinued        1,000 mg 200 mL/hr over 60 Minutes Intravenous Every 24 hours 05/20/23 0946 05/22/23 1002   05/20/23 1030  vancomycin (VANCOREADY) IVPB 1250 mg/250 mL  1,250 mg 166.7 mL/hr over 90 Minutes Intravenous  Once 05/20/23 0944 05/20/23 1230   05/20/23 1000  sulfamethoxazole-trimethoprim (BACTRIM) 400-80 MG per tablet 1 tablet  Status:  Discontinued       Note to Pharmacy: Only after wound culture is sent   1 tablet Oral Every 12 hours 05/20/23 0838 05/20/23 0911   05/20/23 1000  cefTRIAXone (ROCEPHIN) 2 g in sodium chloride 0.9 % 100 mL IVPB  Status:  Discontinued        2 g 200 mL/hr over 30 Minutes Intravenous Every 24 hours 05/20/23 0912 05/22/23 1002   05/20/23 1000  metroNIDAZOLE (FLAGYL) IVPB 500 mg        500 mg 100 mL/hr over 60 Minutes Intravenous Every 12 hours 05/20/23 0912 05/26/23 2204        I have personally reviewed the following labs and images: CBC: Recent Labs  Lab 05/23/23 0436 05/26/23 0407  WBC 5.3 6.3  HGB 11.6* 11.7*  HCT 35.9* 37.4  MCV 88.6 89.3  PLT 343 365   BMP &GFR Recent Labs  Lab 05/23/23 0436  NA 138  K 3.8  CL 110  CO2 21*  GLUCOSE 89  BUN 13  CREATININE 0.70   CALCIUM 7.0*   Estimated Creatinine Clearance: 62.5 mL/min (by C-G formula based on SCr of 0.7 mg/dL). Liver & Pancreas: No results for input(s): "AST", "ALT", "ALKPHOS", "BILITOT", "PROT", "ALBUMIN" in the last 168 hours. No results for input(s): "LIPASE", "AMYLASE" in the last 168 hours. No results for input(s): "AMMONIA" in the last 168 hours. Diabetic: No results for input(s): "HGBA1C" in the last 72 hours. No results for input(s): "GLUCAP" in the last 168 hours. Cardiac Enzymes: No results for input(s): "CKTOTAL", "CKMB", "CKMBINDEX", "TROPONINI" in the last 168 hours. No results for input(s): "PROBNP" in the last 8760 hours. Coagulation Profile: No results for input(s): "INR", "PROTIME" in the last 168 hours. Thyroid Function Tests: No results for input(s): "TSH", "T4TOTAL", "FREET4", "T3FREE", "THYROIDAB" in the last 72 hours. Lipid Profile: No results for input(s): "CHOL", "HDL", "LDLCALC", "TRIG", "CHOLHDL", "LDLDIRECT" in the last 72 hours. Anemia Panel: No results for input(s): "VITAMINB12", "FOLATE", "FERRITIN", "TIBC", "IRON", "RETICCTPCT" in the last 72 hours. Urine analysis:    Component Value Date/Time   COLORURINE YELLOW 08/02/2022 1927   APPEARANCEUR CLEAR 08/02/2022 1927   LABSPEC 1.019 08/02/2022 1927   PHURINE 6.0 08/02/2022 1927   GLUCOSEU NEGATIVE 08/02/2022 1927   HGBUR NEGATIVE 08/02/2022 1927   BILIRUBINUR NEGATIVE 08/02/2022 1927   KETONESUR NEGATIVE 08/02/2022 1927   PROTEINUR NEGATIVE 08/02/2022 1927   NITRITE NEGATIVE 08/02/2022 1927   LEUKOCYTESUR NEGATIVE 08/02/2022 1927   Sepsis Labs: Invalid input(s): "PROCALCITONIN", "LACTICIDVEN"  Microbiology: Recent Results (from the past 240 hour(s))  Aerobic Culture w Gram Stain (superficial specimen)     Status: None   Collection Time: 05/20/23 12:38 PM   Specimen: Breast; Wound  Result Value Ref Range Status   Specimen Description   Final    BREAST Performed at [facility],  [address] [place], [place] [address]    Special Requests   Final    Immunocompromised Performed at [facility], [address] [place], [place] [address]    Gram Stain   Final    NO WBC SEEN RARE GRAM POSITIVE COCCI IN PAIRS RARE GRAM POSITIVE RODS    Culture   Final    ABUNDANT STAPHYLOCOCCUS AUREUS MODERATE DIPHTHEROIDS(CORYNEBACTERIUM SPECIES) Standardized susceptibility testing for this organism is not available.  Performed at [facility], [address], [place], [place] [address]    Report Status 05/22/2023 FINAL  Final   Organism ID, Bacteria STAPHYLOCOCCUS AUREUS  Final      Susceptibility   Staphylococcus aureus - MIC*    CIPROFLOXACIN 1 SENSITIVE Sensitive     ERYTHROMYCIN >=8 RESISTANT Resistant     GENTAMICIN <=0.5 SENSITIVE Sensitive     OXACILLIN 0.5 SENSITIVE Sensitive     TETRACYCLINE <=1 SENSITIVE Sensitive     VANCOMYCIN 1 SENSITIVE Sensitive     TRIMETH/SULFA <=10 SENSITIVE Sensitive     CLINDAMYCIN <=0.25 SENSITIVE Sensitive     RIFAMPIN <=0.5 SENSITIVE Sensitive     Inducible Clindamycin NEGATIVE Sensitive     LINEZOLID 2 SENSITIVE Sensitive     * ABUNDANT STAPHYLOCOCCUS AUREUS    Radiology Studies: No results found.    Ajanae Virag T. Lanis Storlie [facility]  If 7PM-7AM, please contact night-coverage [email or web address] 05/29/2023, 2:03 PM

## 2023-05-29 NOTE — Progress Notes (Signed)
Mobility Specialist - Progress Note   05/29/23 1546  Mobility  Activity Ambulated with assistance in hallway  Level of Assistance Standby assist, set-up cues, supervision of patient - no hands on  Assistive Device Front wheel walker  Distance Ambulated (ft) 1050 ft  RUE Weight Bearing WBAT  Activity Response Tolerated well  Mobility Referral Yes  $Mobility charge 1 Mobility  Mobility Specialist Start Time (ACUTE ONLY) 0315  Mobility Specialist Stop Time (ACUTE ONLY) 0343  Mobility Specialist Time Calculation (min) (ACUTE ONLY) 28 min   Pt received in bed and agreeable to mobility. No complaints during session. Pt to recliner for meal after session with all needs met. Chair alarm on.    [facility]

## 2023-05-29 NOTE — Progress Notes (Signed)
Daily Progress Note   Patient Name: [name]       Date: 05/29/2023 DOB: [birth year]  Age: 62 y.o. MRN#: [id number] Attending Physician: Almon Hercules, MD Primary Care Physician: Artis Delay, MD Admit Date: 05/19/2023 Length of Stay: 9 days  Reason for Consultation/Follow-up: Pain control  Subjective:   CC: Patient unsure if opioid rotation oxycodone has assisted with pain management. Following up regarding pain management.   Subjective:  Reviewed EMR prior to presenting to bedside.  At time of EMR review patient had received oral oxycodone 20 mg every 4 hours as needed x 3 doses.  Had rotated opioid as needed from Dilaudid to oxycodone on 05/28/2023.  Presented to bedside to meet with patient.  Patient appeared comfortably resting when seen and easily awakened.  Patient unsure if oxycodone is overall improving her pain.  Patient notes feeling tired though is unsure if this is due to the oxycodone.  Discussed changing range of oxycodone to 15-20 mg to allow for lower dose if needed to monitor side effects. Patient was very happy that IR provider had come by earlier in the day while her mother was present to discuss possible nerve block.  Encouraged to continue discussions with IR hoping that a nerve block may improve patient's pain since opioid titration has not been managing pain without adverse effects.  Spent time providing emotional support reactive listening.  Thanked patient for allowing me to visit with her today.  Noted palliative medicine team continue following with patient's medical journey.  Review of Systems Pain in chest from metastatic breast cancer Objective:   Vital Signs:  BP (!) 97/59 (BP Location: Left Arm)   Pulse 89   Temp 98.7 F (37.1 C) (Oral)   Resp 18   Ht 5\' 6"  (1.676 m)   Wt 55 kg   SpO2 97%   BMI 19.57 kg/m   Physical Exam: General: NAD, alert, laying in bed, chronically ill-appearing, frail Eyes: No drainage noted HENT: moist mucous  membranes Cardiovascular: RRR, Respiratory: no increased work of breathing noted, not in respiratory distress Abdomen: not distended Neuro: A&Ox4, following commands easily Psych: appropriately answers all questions  Imaging:  I personally reviewed recent imaging.   Assessment & Plan:   Assessment: Patient is a 62 year old female with a past medical history of metastatic right-sided breast cancer with ongoing weight loss and chest wall and back wounds who was admitted on 05/19/2023 from cancer center for cancer associated pain and chest wall wounds that were not improving.  Palliative medicine team consulted for pain management.  Recommendations/Plan:  # Symptom management:  -Pain, acute on chronic in the setting of metastatic breast cancer with chest wall and back wounds   -Continue MS Contin 30 mg every 8 hours scheduled   -Continue gabapentin to 300 mg 3 times daily   -Change oral oxycodone to 15- 20 mg every 4 hours as needed for opioid rotation from oral Dilaudid   -Continue IV Dilaudid 2 mg as needed   -Continue topical diclofenac gel   -IR following up regarding nerve block for pain management.   # Discharge Planning: Skilled Nursing Facility for rehab with Palliative care service follow-up  -Placed referral for outpatient palliative medicine at [facility].  Discussed with: patient, hospitalist, IR  Thank you for allowing the palliative care team to participate in the care Deirdre Priest.  Alvester Morin, DO Palliative Care Provider PMT # [phone]  If patient remains symptomatic despite maximum doses, please call PMT at [phone] between 0700  and 1900. Outside of these hours, please call attending, as PMT does not have night coverage.  *Please note that this is a verbal dictation therefore any spelling or grammatical errors are due to the "Dragon [employer] One" system interpretation.

## 2023-05-29 NOTE — Consult Note (Signed)
Chief Complaint: Patient was seen in consultation today for intercostal nerve block Chief Complaint  Patient presents with   Palpitations   at the request of Alvester Morin   Referring Physician(s): Alvester Morin   Supervising Physician: Gilmer Mor  Patient Status: [facility] - In-pt  History of Present Illness: [name] is a 62 y.o. female with PMHs of HTN, right breast CA with chronic right chest and upper back wound and cancer related pain presenting with uncontrolled pain at the chronic wound site, currently admitted pain control, IR was consulted for possible ntercostal nerve block.   Patient was admitted from 8/20 uncontrolled pain at the site of her chronic wounds, cx obtained from the breast wound which showed staph aureus. Palliative team has been following, patient reported that opioid adjustments are not assisting with her pain management. Patient is reporting  pain on the right upper side of her chest around the area of her lateral right rib radiating inward in a dermatomal fashion.  Palliative team recommended intercostal nerve block which patient decided to give it a try.   IR was requested for intercostal nerve block.  Discussed with Dr. Loreta Ave and Dr. Elby Showers, Dr. Elby Showers states that he can attempt CT guided intercostal nerve block. Medicine and palliative team was informed that the earliest IR can perform will be next Thursday 06/04/23. Patient has been waiting for insurance approved for her to be able to discharged to SNF. OK to proceed either as inpatient or outpatient per both team.   Patient seen at bedside. She is laying in bed, NAD, mother at bedside.  States that she had the right upper chest pain for a long time, located around right shoulder which radiated to right ribs. Nothing really helped with the pain.   Risks and benefit of Intercostal nerve block was discussed in detail which included but not limited to bleeding, infection, damage to adjacent structures,  and no pain relief. Patient was informed that the intercostal nerve block is only performed by small numbers of IR Radiologists, and IR is planning to proceed on next Thursday 9/5 as inpatient or as outpatient sometime during the second week of September with Dr. Elby Showers.  Both patient and mother are ok with the plan. Patient asks to reach out to Dr. Bertis Ruddy to see if she thinks the procedure is a good idea, Dr. Bertis Ruddy agrees with the plan.   Addendum Upon further review with Dr. Elby Showers, patient deemed not a good candidate for intercostal nerve block due to extensive right upper back wound. Medicine and palliative team notified, patient and family updated.    Past Medical History:  Diagnosis Date   Hypertension    rt breast ca 02/2022    Past Surgical History:  Procedure Laterality Date   ANKLE SURGERY     IR THORACENTESIS ASP PLEURAL SPACE W/IMG GUIDE  04/10/2022   IR US GUIDE BX ASP/DRAIN  03/24/2022    Allergies: Patient has no known allergies.  Medications: Prior to Admission medications   Medication Sig Start Date End Date Taking? Authorizing Provider  calcium carbonate (TUMS - DOSED IN MG ELEMENTAL CALCIUM) 500 MG chewable tablet Chew 500 mg by mouth 2 (two) times daily.   Yes [provider]  capecitabine (XELODA) 500 MG tablet Take 2 tablets (1,000 mg total) by mouth 2 (two) times daily after a meal. Take within 30 minutes after meals. Take for 14 days on, 7 days off. Repeat every 21 days. 04/17/23  Yes Artis Delay, MD  cephALEXin (KEFLEX) 500 MG capsule Take 1 capsule (500 mg total) by mouth 4 (four) times daily for 10 days. 05/25/23 06/04/23 Yes Regalado, Belkys A, MD  cholecalciferol (VITAMIN D3) 25 MCG (1000 UNIT) tablet Take 1,000 Units by mouth daily.   Yes [provider]  HYDROmorphone (DILAUDID) 8 MG tablet Take 1 tablet (8 mg total) by mouth every 6 (six) hours as needed for moderate pain. Patient taking differently: Take 8 mg by mouth in the morning, at  noon, in the evening, and at bedtime. 03/19/23  Yes Gorsuch, Ni, MD  magnesium oxide (MAG-OX) 400 (240 Mg) MG tablet Take 1 tablet (400 mg total) by mouth daily. 06/12/22  Yes Gorsuch, Ni, MD  metroNIDAZOLE (FLAGYL) 500 MG tablet Take 1 tablet (500 mg total) by mouth 3 (three) times daily for 10 days. 05/25/23 06/04/23 Yes Regalado, Belkys A, MD  morphine (MS CONTIN) 30 MG 12 hr tablet Take 1 tablet (30 mg total) by mouth every 12 (twelve) hours. 04/07/23  Yes Artis Delay, MD  Multiple Vitamins-Minerals (MULTIVITAMIN WITH MINERALS) tablet Take 1 tablet by mouth daily.   Yes [provider]  polyethylene glycol (MIRALAX / GLYCOLAX) 17 g packet Take 17 g by mouth daily as needed for mild constipation or moderate constipation.   Yes [provider]  senna (SENOKOT) 8.6 MG tablet Take 1 tablet by mouth as needed for constipation.   Yes [provider]  sodium hypochlorite (DAKIN'S 1/4 STRENGTH) 0.125 % SOLN Use as directed for wound care Patient taking differently: Irrigate with 1 Application as directed daily. Use as directed for wound care 04/22/23  Yes   acetaminophen (TYLENOL) 500 MG tablet Take 1 tablet (500 mg total) by mouth every 6 (six) hours. 05/25/23   Regalado, Belkys A, MD  gabapentin (NEURONTIN) 100 MG capsule Take 1 capsule (100 mg total) by mouth 3 (three) times daily. 05/25/23   Regalado, Belkys A, MD  HYDROmorphone (DILAUDID) 8 MG tablet Take 1 tablet (8 mg total) by mouth every 3 (three) hours as needed for severe pain. 05/25/23   Regalado, Belkys A, MD  metroNIDAZOLE (METROGEL) 0.75 % gel Apply topically daily. 05/26/23   Regalado, Belkys A, MD  morphine (MS CONTIN) 30 MG 12 hr tablet Take 1 tablet (30 mg total) by mouth every 8 (eight) hours for 7 days. 05/25/23 06/01/23  Regalado, Belkys A, MD  polyethylene glycol (MIRALAX / GLYCOLAX) 17 g packet Take 17 g by mouth 2 (two) times daily. 05/25/23   Regalado, Prentiss Bells, MD     Family History  Problem Relation Age of Onset    Hypertension Mother    Heart disease Father    Stroke Father    Kidney disease Father    Hypertension Sister    Hypertension Maternal Grandmother    Stroke Maternal Grandfather    Diabetes Paternal Grandmother    Cancer Cousin     Social History   Socioeconomic History   Marital status: Single    Spouse name: Not on file   Number of children: 0   Years of education: Not on file   Highest education level: Not on file  Occupational History   Occupation: retired Runner, broadcasting/film/video  Tobacco Use   Smoking status: Never   Smokeless tobacco: Never  Substance and Sexual Activity   Alcohol use: No   Drug use: No   Sexual activity: Not on file  Other Topics Concern   Not on file  Social History Narrative   Not on file  Social Determinants of Health   Financial Resource Strain: Low Risk  (04/03/2022)   Overall Financial Resource Strain (CARDIA)    Difficulty of Paying Living Expenses: Not hard at all  Food Insecurity: No Food Insecurity (05/20/2023)   Hunger Vital Sign    Worried About Running Out of Food in the Last Year: Never true    Ran Out of Food in the Last Year: Never true  Transportation Needs: No Transportation Needs (05/20/2023)   PRAPARE - Administrator, Civil Service (Medical): No    Lack of Transportation (Non-Medical): No  Physical Activity: Not on file  Stress: Not on file  Social Connections: Not on file     Review of Systems: A 12 point ROS discussed and pertinent positives are indicated in the HPI above.  All other systems are negative.  Vital Signs: BP (!) 97/58 (BP Location: Left Arm)   Pulse 92   Temp 97.8 F (36.6 C) (Oral)   Resp 16   Ht 5\' 6"  (1.676 m)   Wt 121 lb 4.1 oz (55 kg)   SpO2 97%   BMI 19.57 kg/m    Physical Exam Vitals reviewed.  Constitutional:      General: She is not in acute distress.    Appearance: She is not ill-appearing.  HENT:     Head: Normocephalic and atraumatic.  Pulmonary:     Effort: Pulmonary effort  is normal.  Abdominal:     General: Abdomen is flat. Bowel sounds are normal.     Palpations: Abdomen is soft.  Musculoskeletal:     Cervical back: Neck supple.  Skin:    General: Skin is warm and dry.     Coloration: Skin is not jaundiced or pale.  Neurological:     Mental Status: She is alert and oriented to person, place, and time.  Psychiatric:        Mood and Affect: Mood normal.        Behavior: Behavior normal.        Judgment: Judgment normal.        Imaging: VAS [place] LOWER EXTREMITY VENOUS (DVT)  Result Date: 05/29/2023  Lower Venous DVT Study Patient Name:  BREION JEPPSEN  Date of Exam:   05/28/2023 Medical Rec #: [id number]        Accession #:    [id number] Date of Birth: 06-Apr-1961         Patient Gender: F Patient Age:   51 years Exam Location:  [facility] Procedure:      VAS [place] LOWER EXTREMITY VENOUS (DVT) Referring Phys: Jon Billings REGALADO --------------------------------------------------------------------------------  Indications: Swelling.  Risk Factors: Cancer. Limitations: Poor ultrasound/tissue interface. Comparison Study: 05/20/2023 - RIGHT:                   - There is no evidence of deep vein thrombosis in the lower                   extremity.                    - No cystic structure found in the popliteal fossa.                    LEFT:                   - There is no evidence of deep vein thrombosis in the lower  extremity.                    - No cystic structure found in the popliteal fossa. Performing Technologist: Chanda Busing RVT  Examination Guidelines: A complete evaluation includes B-mode imaging, spectral Doppler, color Doppler, and power Doppler as needed of all accessible portions of each vessel. Bilateral testing is considered an integral part of a complete examination. Limited examinations for reoccurring indications may be performed as noted. The reflux portion of the exam is performed with the patient in reverse Trendelenburg.   +---------+---------------+---------+-----------+----------+--------------+ RIGHT    CompressibilityPhasicitySpontaneityPropertiesThrombus Aging +---------+---------------+---------+-----------+----------+--------------+ CFV      Full           Yes      Yes                                 +---------+---------------+---------+-----------+----------+--------------+ SFJ      Full                                                        +---------+---------------+---------+-----------+----------+--------------+ FV Prox  Full                                                        +---------+---------------+---------+-----------+----------+--------------+ FV Mid   Full                                                        +---------+---------------+---------+-----------+----------+--------------+ FV DistalFull                                                        +---------+---------------+---------+-----------+----------+--------------+ PFV      Full                                                        +---------+---------------+---------+-----------+----------+--------------+ POP      Full           Yes      Yes                                 +---------+---------------+---------+-----------+----------+--------------+ PTV      Full                                                        +---------+---------------+---------+-----------+----------+--------------+ PERO     Full                                                        +---------+---------------+---------+-----------+----------+--------------+   +----+---------------+---------+-----------+----------+--------------+  LEFTCompressibilityPhasicitySpontaneityPropertiesThrombus Aging +----+---------------+---------+-----------+----------+--------------+ CFV Full           Yes      Yes                                  +----+---------------+---------+-----------+----------+--------------+     Summary: RIGHT: - There is no evidence of deep vein thrombosis in the lower extremity.  - No cystic structure found in the popliteal fossa.  LEFT: - No evidence of common femoral vein obstruction.   *See table(s) above for measurements and observations. Electronically signed by Gerarda Fraction on 05/29/2023 at 9:22:17 AM.    Final    VAS [place] LOWER EXTREMITY VENOUS (DVT)  Result Date: 05/20/2023  Lower Venous DVT Study Patient Name:  JHAYLA MERLINI  Date of Exam:   05/20/2023 Medical Rec #: [id number]        Accession #:    [id number] Date of Birth: 1961-05-22         Patient Gender: F Patient Age:   51 years Exam Location:  [facility] Procedure:      VAS [place] LOWER EXTREMITY VENOUS (DVT) Referring Phys: Jon Billings REGALADO --------------------------------------------------------------------------------  Indications: Edema.  Risk Factors: Cancer. Comparison Study: No prior studies. Performing Technologist: Chanda Busing RVT  Examination Guidelines: A complete evaluation includes B-mode imaging, spectral Doppler, color Doppler, and power Doppler as needed of all accessible portions of each vessel. Bilateral testing is considered an integral part of a complete examination. Limited examinations for reoccurring indications may be performed as noted. The reflux portion of the exam is performed with the patient in reverse Trendelenburg.  +---------+---------------+---------+-----------+----------+--------------+ RIGHT    CompressibilityPhasicitySpontaneityPropertiesThrombus Aging +---------+---------------+---------+-----------+----------+--------------+ CFV      Full           Yes      Yes                                 +---------+---------------+---------+-----------+----------+--------------+ SFJ      Full                                                         +---------+---------------+---------+-----------+----------+--------------+ FV Prox  Full                                                        +---------+---------------+---------+-----------+----------+--------------+ FV Mid   Full                                                        +---------+---------------+---------+-----------+----------+--------------+ FV DistalFull                                                        +---------+---------------+---------+-----------+----------+--------------+ PFV  Full                                                        +---------+---------------+---------+-----------+----------+--------------+ POP      Full           Yes      Yes                                 +---------+---------------+---------+-----------+----------+--------------+ PTV      Full                                                        +---------+---------------+---------+-----------+----------+--------------+ PERO     Full                                                        +---------+---------------+---------+-----------+----------+--------------+   +---------+---------------+---------+-----------+----------+--------------+ LEFT     CompressibilityPhasicitySpontaneityPropertiesThrombus Aging +---------+---------------+---------+-----------+----------+--------------+ CFV      Full           Yes      Yes                                 +---------+---------------+---------+-----------+----------+--------------+ SFJ      Full                                                        +---------+---------------+---------+-----------+----------+--------------+ FV Prox  Full                                                        +---------+---------------+---------+-----------+----------+--------------+ FV Mid   Full                                                         +---------+---------------+---------+-----------+----------+--------------+ FV DistalFull                                                        +---------+---------------+---------+-----------+----------+--------------+ PFV      Full                                                        +---------+---------------+---------+-----------+----------+--------------+  POP      Full           Yes      Yes                                 +---------+---------------+---------+-----------+----------+--------------+ PTV      Full                                                        +---------+---------------+---------+-----------+----------+--------------+ PERO     Full                                                        +---------+---------------+---------+-----------+----------+--------------+     Summary: RIGHT: - There is no evidence of deep vein thrombosis in the lower extremity.  - No cystic structure found in the popliteal fossa.  LEFT: - There is no evidence of deep vein thrombosis in the lower extremity.  - No cystic structure found in the popliteal fossa.  *See table(s) above for measurements and observations. Electronically signed by Heath Lark on 05/20/2023 at 8:50:49 PM.    Final    DG Chest Port 1 View  Result Date: 05/19/2023 CLINICAL DATA:  Chest pain and palpitations EXAM: PORTABLE CHEST 1 VIEW COMPARISON:  Radiographs 08/22/2022 and CT chest 03/16/2023 FINDINGS: Stable cardiomediastinal silhouette. The right hilar fullness is likely at least in part due to the right chest wall lesions better seen on CT 03/16/2023. Right perihilar and lower lobe consolidation. Small right pleural effusion. The left lung is clear. No pneumothorax. IMPRESSION: 1. Right perihilar and lower lobe atelectasis or pneumonia with small right pleural effusion. 2. Right hilar fullness is in at least part due to the right chest wall lesions better seen on CT 03/16/2023. Electronically Signed    By: Minerva Fester M.D.   On: 05/19/2023 19:17    Labs:  CBC: Recent Labs    05/21/23 0420 05/21/23 1946 05/22/23 1052 05/23/23 0436 05/26/23 0407  WBC 5.5  --  6.0 5.3 6.3  HGB 6.8* 5.4* 11.6* 11.6* 11.7*  HCT 21.9* 17.2* 36.1 35.9* 37.4  PLT 394  --  351 343 365    COAGS: Recent Labs    08/05/22 0458 08/06/22 0456  INR 1.4* 1.3*    BMP: Recent Labs    05/20/23 1012 05/21/23 0420 05/22/23 1052 05/23/23 0436  NA 134* 135 138 138  K 3.4* 3.4* 3.3* 3.8  CL 101 107 110 110  CO2 25 22 21* 21*  GLUCOSE 135* 99 113* 89  BUN 17 13 12 13   CALCIUM 7.4* 7.1* 7.0* 7.0*  CREATININE 0.86 0.79 0.66 0.70  GFRNONAA >60 >60 >60 >60    LIVER FUNCTION TESTS: Recent Labs    04/07/23 1328 04/17/23 1338 05/08/23 1510 05/19/23 1810  BILITOT 0.5 0.5 0.3 0.6  AST 27 41 33 43*  ALT 8 9 6 12   ALKPHOS 56 57 64 73  PROT 6.9 6.9 6.4* 6.6  ALBUMIN 3.3* 3.5 3.2* 2.7*    TUMOR MARKERS: No results for input(s): "AFPTM", "CEA", "CA199", "CHROMGRNA" in the last 8760 hours.  Assessment  and Plan: 62 y.o. female with right breast CA and chronic right chest and upper back wound and poorly controlled cancer related pain.   IR was consulted for intercostal nerve block, unfortunately patient was deemed not a good candidate for intercostal nerve block due to extensive right upper back wound.   Medicine, palliative, patient and mother updated.    Thank you for this interesting consult.  I greatly enjoyed meeting Kenai Sharples and look forward to participating in their care.  A copy of this report was sent to the requesting provider on this date.  Electronically Signed: Willette Brace, PA-C 05/29/2023, 2:51 PM   I spent a total of 40 Minutes    in face to face in clinical consultation, greater than 50% of which was counseling/coordinating care for intercostal nerve block.   This chart was dictated using voice recognition software.  Despite best efforts to proofread,  errors can occur  which can change the documentation meaning.

## 2023-05-29 NOTE — TOC Progression Note (Signed)
Transition of Care [facility]) - Progression Note    Patient Details  Name: [name] MRN: [id number] Date of Birth: July 21, 1961  Transition of Care [facility]) CM/SW Contact  Georgie Chard, LCSW Phone Number: 05/29/2023, 7:41 PM  Clinical Narrative:    AT this time patient's AUTH was approved. Please follow up with Kia at [facility] to see about possible weekend admission. This CSW will follow up with patient/ family to inform. TOC will continue to follow.    Expected Discharge Plan: Skilled Nursing Facility Barriers to Discharge: Insurance Authorization  Expected Discharge Plan and Services In-house Referral: Clinical Social Work     Living arrangements for the past 2 months: Apartment Expected Discharge Date: 05/25/23                                     Social Determinants of Health (SDOH) Interventions SDOH Screenings   Food Insecurity: No Food Insecurity (05/20/2023)  Housing: Low Risk  (05/20/2023)  Transportation Needs: No Transportation Needs (05/20/2023)  Utilities: Not At Risk (05/20/2023)  Financial Resource Strain: Low Risk  (04/03/2022)  Tobacco Use: Low Risk  (05/19/2023)    Readmission Risk Interventions    08/30/2022    1:46 PM 08/06/2022   10:01 AM  Readmission Risk Prevention Plan  Transportation Screening Complete   PCP or Specialist Appt within 3-5 Days Complete   HRI or Home Care Consult Complete   Social Work Consult for Recovery Care Planning/Counseling Complete   Palliative Care Screening Not Applicable   Medication Review Oceanographer) Complete Complete  PCP or Specialist appointment within 3-5 days of discharge  Complete  HRI or Home Care Consult  Complete  SW Recovery Care/Counseling Consult  Complete  Palliative Care Screening  Not Applicable  Skilled Nursing Facility  Complete

## 2023-05-30 DIAGNOSIS — Z515 Encounter for palliative care: Secondary | ICD-10-CM | POA: Diagnosis not present

## 2023-05-30 DIAGNOSIS — T148XXD Other injury of unspecified body region, subsequent encounter: Secondary | ICD-10-CM | POA: Diagnosis not present

## 2023-05-30 DIAGNOSIS — D75839 Thrombocytosis, unspecified: Secondary | ICD-10-CM | POA: Diagnosis not present

## 2023-05-30 DIAGNOSIS — G893 Neoplasm related pain (acute) (chronic): Secondary | ICD-10-CM

## 2023-05-30 DIAGNOSIS — Z79899 Other long term (current) drug therapy: Secondary | ICD-10-CM | POA: Diagnosis not present

## 2023-05-30 DIAGNOSIS — R52 Pain, unspecified: Secondary | ICD-10-CM | POA: Diagnosis not present

## 2023-05-30 DIAGNOSIS — C50911 Malignant neoplasm of unspecified site of right female breast: Secondary | ICD-10-CM | POA: Diagnosis not present

## 2023-05-30 MED ORDER — METRONIDAZOLE 500 MG PO TABS
500.0000 mg | ORAL_TABLET | Freq: Three times a day (TID) | ORAL | Status: AC
Start: 2023-05-30 — End: 2023-06-04

## 2023-05-30 MED ORDER — ENSURE ENLIVE PO LIQD: 237.0000 mL | Freq: Two times a day (BID) | ORAL | Status: DC

## 2023-05-30 MED ORDER — JUVEN PO PACK: 1.0000 | PACK | Freq: Two times a day (BID) | ORAL | Status: DC

## 2023-05-30 MED ORDER — GABAPENTIN 300 MG PO CAPS: 300.0000 mg | ORAL_CAPSULE | Freq: Three times a day (TID) | ORAL | Status: DC

## 2023-05-30 MED ORDER — CEPHALEXIN 500 MG PO CAPS: 500.0000 mg | ORAL_CAPSULE | Freq: Four times a day (QID) | ORAL | Status: AC

## 2023-05-30 NOTE — Progress Notes (Signed)
Pt refused wound care this early morning and requested to do it after she takes a shower later. Will endorse to AM nurse.

## 2023-05-30 NOTE — Progress Notes (Signed)
Mobility Specialist - Progress Note   05/30/23 0857  Mobility  Activity Ambulated with assistance in hallway  Level of Assistance Modified independent, requires aide device or extra time  Assistive Device Front wheel walker  Distance Ambulated (ft) 700 ft  Range of Motion/Exercises Active  Activity Response Tolerated well  Mobility Referral Yes  $Mobility charge 1 Mobility  Mobility Specialist Start Time (ACUTE ONLY) [id number]  Mobility Specialist Stop Time (ACUTE ONLY) 0857  Mobility Specialist Time Calculation (min) (ACUTE ONLY) 22 min   Pt was found in bed and agreeable to ambulate. No complaints with session. At EOS returned to recliner chair for breakfast. Was left with all needs met. Call bell in reach and chair alarm on.  Billey Chang Mobility Specialist

## 2023-05-30 NOTE — Discharge Summary (Signed)
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 Patient was on MS Contin and oral Dilaudid at home. She had worsening drainage and pain with associated tachycardia with heart rate to 140s. CXR with perihilar and lower lobe atelectasis versus pneumonia and small right pleural effusion. Patient was started on IV and p.o. pain medication. Palliative medicine and oncology consulted. She was also started on IV antibiotics due to concern for wound infection. Wound culture grew MSSA and diphtheroids. Antibiotic de-escalated to Ancef and Flagyl.  She is discharged on p.o. Flagyl and Keflex until 9/5.   Patient requires skilled nursing for daily for complex wound care that she cannot manage at home.  See wound care instruction  below.  Also recommend follow-up by dietitian at SNF to optimize nutrition and wound healing.  See individual problem list below for more.   Problems addressed during this hospitalization Principal Problem:   Intractable pain Active Problems:   Breast cancer, stage 4, right (HCC)   Thrombocytosis   Wound healing, delayed   Medication management   Counseling and coordination of care   High risk medication use   Intractable cancer-related pain -Continue p.o. and IV Dilaudid, MS Contin and gabapentin per palliative medicine. -Discharge pain regimen: P.o. Dilaudid, MS Contin  and gabapentin.  Can consider increasing gabapentin -IR consulted for possible nerve block but not feasible due to extensive wound.   Chronic chest wall wound, with active infection?  Very extensive wound around right chest measuring about 35 cm x 50 cm as above.  Does not seem very deep.  Reportedly with foul-smelling drainage earlier in the course.  Wound culture with MSSA and diphtheroids but superficial.  No signs of infection on my exam.  VZV negative.  HSV PCR pending -Continue antibiotics as previously planned through 9/5. Wound care  Daily       Comments: Metronidazole gel applied to xeroform gauze [place] # 294 5x9) and placed over lesion once daily. This  is to be topped with dry gauze and ABD pads and secured. May use mesh briefs with the center cut to form a "tube top" or circular elastic dressing to secure or use roll gauze to encircle chest and secure with paper tape.  -Patient requires skilled nursing for daily wound dressing change. -Follow HSV PCR but low suspicion.   Stage IV breast cancer: On Xeloda at home. -Okay to resume Xeloda.  -Outpatient follow-up with oncology, Dr. Bertis Ruddy   Tachycardia: Likely autonomic response to pain and dehydration.  Resolved.   Debility: Very mobile. 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 Recent Labs (within last 365 days)              Recent Labs    04/07/23 1328 04/17/23 1338 05/08/23 1510 05/19/23 1810 05/20/23 1012 05/21/23 0420 05/21/23 1946 05/22/23 1052 05/23/23 0436 05/26/23 0407  HGB 9.8* 10.2* 9.0* 9.1* 7.5* 6.8* 5.4* 11.6* 11.6* 11.7*    -  Check CBC in 1 week   Severe malnutrition Nutrition Problem: Severe Malnutrition Etiology: chronic illness, cancer and cancer related treatments Signs/Symptoms: percent weight loss, severe muscle depletion, severe fat depletion Percent weight loss: 9.8 % (in 1 month) Interventions: Ensure Enlive (each supplement  provides 350kcal and 20 grams of protein), Juven, Refer to RD note for recommendations, Education, Boost Breeze -Recommend follow-up by dietitian at SNF     Time spent 35 minutes  Vital signs Vitals:   05/29/23 0611 05/29/23 1219 05/29/23 2127 05/30/23 0459  BP: (!) 97/59 (!) 97/58 100/63 103/60  Pulse: 89 92 90 93  Temp: 98.7 F (37.1 C) 97.8 F (36.6 C) 97.8 F (36.6 C)   Resp: 18 16 18 19   Height:      Weight:      SpO2: 97% 97% 97% 98%  TempSrc: Oral Oral Oral   BMI (Calculated):         Discharge exam  GENERAL: No apparent distress.  Nontoxic. HEENT: MMM.  Vision and hearing grossly intact.  NECK: Supple.  No apparent JVD.  RESP:  No IWOB.  Fair aeration bilaterally. CVS:  RRR. Heart sounds normal.  ABD/GI/GU: BS+. Abd soft, NTND.  MSK/EXT:  Moves extremities. No apparent deformity.  Trace BLE edema. SKIN: Extensive skin wound over right chest anteriorly and posteriorly.  No signs of infection. NEURO: Awake, alert and oriented appropriately.  No apparent focal neuro deficit. PSYCH: Calm. Normal affect.  Discharge Instructions Discharge Instructions     Amb Referral to Palliative Care   Complete by: As directed    Diet general   Complete by: As directed    Discharge wound care:   Complete by: As directed    See above   Discharge wound care:   Complete by: As directed    Wound care  Daily      Comments: Metronidazole gel applied to xeroform gauze [place] # 294 5x9) and placed over lesion once daily. This  is to be topped with dry gauze and ABD pads and secured. May use mesh briefs with the center cut to form a "tube top" or circular elastic dressing to secure or use roll gauze to encircle chest and secure with paper tape.   Increase activity slowly   Complete by: As directed       Allergies as of 05/30/2023   No Known Allergies      Medication List     STOP taking these medications    capecitabine 500 MG tablet Commonly known as: XELODA   sodium  hypochlorite 0.125 % Soln Commonly known as: DAKIN'S 1/4 STRENGTH       TAKE these medications    acetaminophen 500 MG tablet Commonly known as: TYLENOL Take 1 tablet (500 mg total) by mouth every 6 (six) hours.   calcium carbonate 500 MG chewable tablet Commonly known as: TUMS - dosed in mg elemental calcium Chew 500 mg by mouth 2 (two) times daily.   cephALEXin 500 MG capsule Commonly known as: KEFLEX Take 1 capsule (500 mg total) by mouth 4 (four) times daily for 5 days.   cholecalciferol 25 MCG (1000 UNIT) tablet Commonly known as: VITAMIN D3 Take 1,000 Units by mouth daily.   feeding supplement Liqd Take 237 mLs by mouth 2 (two) times daily between meals.   nutrition supplement (JUVEN) Pack Take 1 packet by mouth 2 (two) times daily between meals.   gabapentin 300 MG capsule Commonly known as: NEURONTIN Take 1 capsule (300 mg total) by  mouth 3 (three) times daily.   HYDROmorphone 8 MG tablet Commonly known as: DILAUDID Take 1 tablet (8 mg total) by mouth every 3 (three) hours as needed for severe pain. What changed:  when to take this reasons to take this   magnesium oxide 400 (240 Mg) MG tablet Commonly known as: MAG-OX Take 1 tablet (400 mg total) by mouth daily.   metroNIDAZOLE 0.75 % gel Commonly known as: METROGEL Apply topically daily.   metroNIDAZOLE 500 MG tablet Commonly known as: Flagyl Take 1 tablet (500 mg total) by mouth 3 (three) times daily for 5 days.   morphine 30 MG 12 hr tablet Commonly known as: MS CONTIN Take 1 tablet (30 mg total) by mouth every 8 (eight) hours for 7 days. What changed: when to take this   multivitamin with minerals tablet Take 1 tablet by mouth daily.   polyethylene glycol 17 g packet Commonly known as: MIRALAX / GLYCOLAX Take 17 g by mouth 2 (two) times daily. What changed:  when to take this reasons to take this   senna 8.6 MG tablet Commonly known as: SENOKOT Take 1 tablet by mouth as needed for  constipation.               Discharge Care Instructions  (From admission, onward)           Start     Ordered   05/30/23 0000  Discharge wound care:       Comments: Wound care  Daily      Comments: Metronidazole gel applied to xeroform gauze [place] # 294 5x9) and placed over lesion once daily. This  is to be topped with dry gauze and ABD pads and secured. May use mesh briefs with the center cut to form a "tube top" or circular elastic dressing to secure or use roll gauze to encircle chest and secure with paper tape.   05/30/23 0758   05/25/23 0000  Discharge wound care:       Comments: See above   05/25/23 5409            Consultations: Palliative medicine Interventional radiology Wound care Dietitian  Procedures/Studies:    VAS [place] LOWER EXTREMITY VENOUS (DVT)  Result Date: 05/29/2023  Lower Venous DVT Study Patient Name:  [name]  Date of Exam:   05/28/2023 Medical Rec #: [id number]        Accession #:    [id number] Date of Birth: [birth year]         Patient Gender: F Patient Age:   38 years Exam Location:  [facility] Procedure:      VAS [place] LOWER EXTREMITY VENOUS (DVT) Referring Phys: Jon Billings REGALADO --------------------------------------------------------------------------------  Indications: Swelling.  Risk Factors: Cancer. Limitations: Poor ultrasound/tissue interface. Comparison Study: 05/20/2023 - RIGHT:                   - There is no evidence of deep vein thrombosis in the lower                   extremity.                    - No cystic structure found in the popliteal fossa.                    LEFT:                   - There is no evidence of deep  vein thrombosis in the lower                   extremity.                    - No cystic structure found in the popliteal fossa. Performing Technologist: Chanda Busing RVT  Examination Guidelines: A complete evaluation includes B-mode imaging, spectral Doppler, color Doppler, and power Doppler as needed  of all accessible portions of each vessel. Bilateral testing is considered an integral part of a complete examination. Limited examinations for reoccurring indications may be performed as noted. The reflux portion of the exam is performed with the patient in reverse Trendelenburg.  +---------+---------------+---------+-----------+----------+--------------+ RIGHT    CompressibilityPhasicitySpontaneityPropertiesThrombus Aging +---------+---------------+---------+-----------+----------+--------------+ CFV      Full           Yes      Yes                                 +---------+---------------+---------+-----------+----------+--------------+ SFJ      Full                                                        +---------+---------------+---------+-----------+----------+--------------+ FV Prox  Full                                                        +---------+---------------+---------+-----------+----------+--------------+ FV Mid   Full                                                        +---------+---------------+---------+-----------+----------+--------------+ FV DistalFull                                                        +---------+---------------+---------+-----------+----------+--------------+ PFV      Full                                                        +---------+---------------+---------+-----------+----------+--------------+ POP      Full           Yes      Yes                                 +---------+---------------+---------+-----------+----------+--------------+ PTV      Full                                                        +---------+---------------+---------+-----------+----------+--------------+  PERO     Full                                                        +---------+---------------+---------+-----------+----------+--------------+    +----+---------------+---------+-----------+----------+--------------+ LEFTCompressibilityPhasicitySpontaneityPropertiesThrombus Aging +----+---------------+---------+-----------+----------+--------------+ CFV Full           Yes      Yes                                 +----+---------------+---------+-----------+----------+--------------+     Summary: RIGHT: - There is no evidence of deep vein thrombosis in the lower extremity.  - No cystic structure found in the popliteal fossa.  LEFT: - No evidence of common femoral vein obstruction.   *See table(s) above for measurements and observations. Electronically signed by Gerarda Fraction on 05/29/2023 at 9:22:17 AM.    Final    VAS [place] LOWER EXTREMITY VENOUS (DVT)  Result Date: 05/20/2023  Lower Venous DVT Study Patient Name:  [name]  Date of Exam:   05/20/2023 Medical Rec #: [id number]        Accession #:    [id number] Date of Birth: 26-Jun-1961         Patient Gender: F Patient Age:   40 years Exam Location:  [facility] Procedure:      VAS [place] LOWER EXTREMITY VENOUS (DVT) Referring Phys: Jon Billings REGALADO --------------------------------------------------------------------------------  Indications: Edema.  Risk Factors: Cancer. Comparison Study: No prior studies. Performing Technologist: Chanda Busing RVT  Examination Guidelines: A complete evaluation includes B-mode imaging, spectral Doppler, color Doppler, and power Doppler as needed of all accessible portions of each vessel. Bilateral testing is considered an integral part of a complete examination. Limited examinations for reoccurring indications may be performed as noted. The reflux portion of the exam is performed with the patient in reverse Trendelenburg.  +---------+---------------+---------+-----------+----------+--------------+ RIGHT    CompressibilityPhasicitySpontaneityPropertiesThrombus Aging +---------+---------------+---------+-----------+----------+--------------+ CFV       Full           Yes      Yes                                 +---------+---------------+---------+-----------+----------+--------------+ SFJ      Full                                                        +---------+---------------+---------+-----------+----------+--------------+ FV Prox  Full                                                        +---------+---------------+---------+-----------+----------+--------------+ FV Mid   Full                                                        +---------+---------------+---------+-----------+----------+--------------+  FV DistalFull                                                        +---------+---------------+---------+-----------+----------+--------------+ PFV      Full                                                        +---------+---------------+---------+-----------+----------+--------------+ POP      Full           Yes      Yes                                 +---------+---------------+---------+-----------+----------+--------------+ PTV      Full                                                        +---------+---------------+---------+-----------+----------+--------------+ PERO     Full                                                        +---------+---------------+---------+-----------+----------+--------------+   +---------+---------------+---------+-----------+----------+--------------+ LEFT     CompressibilityPhasicitySpontaneityPropertiesThrombus Aging +---------+---------------+---------+-----------+----------+--------------+ CFV      Full           Yes      Yes                                 +---------+---------------+---------+-----------+----------+--------------+ SFJ      Full                                                        +---------+---------------+---------+-----------+----------+--------------+ FV Prox  Full                                                         +---------+---------------+---------+-----------+----------+--------------+ FV Mid   Full                                                        +---------+---------------+---------+-----------+----------+--------------+ FV DistalFull                                                        +---------+---------------+---------+-----------+----------+--------------+  PFV      Full                                                        +---------+---------------+---------+-----------+----------+--------------+ POP      Full           Yes      Yes                                 +---------+---------------+---------+-----------+----------+--------------+ PTV      Full                                                        +---------+---------------+---------+-----------+----------+--------------+ PERO     Full                                                        +---------+---------------+---------+-----------+----------+--------------+     Summary: RIGHT: - There is no evidence of deep vein thrombosis in the lower extremity.  - No cystic structure found in the popliteal fossa.  LEFT: - There is no evidence of deep vein thrombosis in the lower extremity.  - No cystic structure found in the popliteal fossa.  *See table(s) above for measurements and observations. Electronically signed by Heath Lark on 05/20/2023 at 8:50:49 PM.    Final    DG Chest Port 1 View  Result Date: 05/19/2023 CLINICAL DATA:  Chest pain and palpitations EXAM: PORTABLE CHEST 1 VIEW COMPARISON:  Radiographs 08/22/2022 and CT chest 03/16/2023 FINDINGS: Stable cardiomediastinal silhouette. The right hilar fullness is likely at least in part due to the right chest wall lesions better seen on CT 03/16/2023. Right perihilar and lower lobe consolidation. Small right pleural effusion. The left lung is clear. No pneumothorax. IMPRESSION: 1. Right perihilar and lower lobe atelectasis or  pneumonia with small right pleural effusion. 2. Right hilar fullness is in at least part due to the right chest wall lesions better seen on CT 03/16/2023. Electronically Signed   By: Minerva Fester M.D.   On: 05/19/2023 19:17       The results of significant diagnostics from this hospitalization (including imaging, microbiology, ancillary and laboratory) are listed below for reference.     Microbiology: Recent Results (from the past 240 hour(s))  Aerobic Culture w Gram Stain (superficial specimen)     Status: None   Collection Time: 05/20/23 12:38 PM   Specimen: Breast; Wound  Result Value Ref Range Status   Specimen Description   Final    BREAST Performed at [facility], [address] [place], [place] [address]    Special Requests   Final    Immunocompromised Performed at [facility], [address], [place], [place] [address]    Gram Stain   Final    NO WBC SEEN RARE GRAM POSITIVE COCCI IN PAIRS RARE GRAM POSITIVE RODS    Culture   Final    ABUNDANT  STAPHYLOCOCCUS AUREUS MODERATE DIPHTHEROIDS(CORYNEBACTERIUM SPECIES) Standardized susceptibility testing for this organism is not available. Performed at [facility], [address] [place], [place] [address]    Report Status 05/22/2023 FINAL  Final   Organism ID, Bacteria STAPHYLOCOCCUS AUREUS  Final      Susceptibility   Staphylococcus aureus - MIC*    CIPROFLOXACIN 1 SENSITIVE Sensitive     ERYTHROMYCIN >=8 RESISTANT Resistant     GENTAMICIN <=0.5 SENSITIVE Sensitive     OXACILLIN 0.5 SENSITIVE Sensitive     TETRACYCLINE <=1 SENSITIVE Sensitive     VANCOMYCIN 1 SENSITIVE Sensitive     TRIMETH/SULFA <=10 SENSITIVE Sensitive     CLINDAMYCIN <=0.25 SENSITIVE Sensitive     RIFAMPIN <=0.5 SENSITIVE Sensitive     Inducible Clindamycin NEGATIVE Sensitive     LINEZOLID 2 SENSITIVE Sensitive     * ABUNDANT STAPHYLOCOCCUS AUREUS     Labs:  CBC: Recent Labs  Lab  05/26/23 0407  WBC 6.3  HGB 11.7*  HCT 37.4  MCV 89.3  PLT 365   BMP &GFR No results for input(s): "NA", "K", "CL", "CO2", "GLUCOSE", "BUN", "CREATININE", "CALCIUM", "MG", "PHOS" in the last 168 hours.  Invalid input(s): "GFRCG" Estimated Creatinine Clearance: 62.5 mL/min (by C-G formula based on SCr of 0.7 mg/dL). Liver & Pancreas: No results for input(s): "AST", "ALT", "ALKPHOS", "BILITOT", "PROT", "ALBUMIN" in the last 168 hours. No results for input(s): "LIPASE", "AMYLASE" in the last 168 hours. No results for input(s): "AMMONIA" in the last 168 hours. Diabetic: No results for input(s): "HGBA1C" in the last 72 hours. No results for input(s): "GLUCAP" in the last 168 hours. Cardiac Enzymes: No results for input(s): "CKTOTAL", "CKMB", "CKMBINDEX", "TROPONINI" in the last 168 hours. No results for input(s): "PROBNP" in the last 8760 hours. Coagulation Profile: No results for input(s): "INR", "PROTIME" in the last 168 hours. Thyroid Function Tests: No results for input(s): "TSH", "T4TOTAL", "FREET4", "T3FREE", "THYROIDAB" in the last 72 hours. Lipid Profile: No results for input(s): "CHOL", "HDL", "LDLCALC", "TRIG", "CHOLHDL", "LDLDIRECT" in the last 72 hours. Anemia Panel: No results for input(s): "VITAMINB12", "FOLATE", "FERRITIN", "TIBC", "IRON", "RETICCTPCT" in the last 72 hours. Urine analysis:    Component Value Date/Time   COLORURINE YELLOW 08/02/2022 1927   APPEARANCEUR CLEAR 08/02/2022 1927   LABSPEC 1.019 08/02/2022 1927   PHURINE 6.0 08/02/2022 1927   GLUCOSEU NEGATIVE 08/02/2022 1927   HGBUR NEGATIVE 08/02/2022 1927   BILIRUBINUR NEGATIVE 08/02/2022 1927   KETONESUR NEGATIVE 08/02/2022 1927   PROTEINUR NEGATIVE 08/02/2022 1927   NITRITE NEGATIVE 08/02/2022 1927   LEUKOCYTESUR NEGATIVE 08/02/2022 1927   Sepsis Labs: Invalid input(s): "PROCALCITONIN", "LACTICIDVEN"   SIGNED:  Almon Hercules, MD  [facility] 05/30/2023, 12:28 PM

## 2023-05-30 NOTE — TOC Transition Note (Signed)
Transition of Care [facility]) - CM/SW Discharge Note   Patient Details  Name: [name] MRN: [id number] Date of Birth: May 08, 1961  Transition of Care [facility]) CM/SW Contact:  Carmina Miller, LCSWA Phone Number: 05/30/2023, 12:24 PM   Clinical Narrative:     CSW spoke with Kia at [facility], she confirms pt can come today after 4:00 pm. CSW spoke with pt's mother who prefers to transport pt via private vehicle to SNF, per Kia this is ok as long as pt doesn't arrive before 4:00 pm. MD and RN updated.  Patient will DC to: [facility] Anticipated DC date: 05/30/23 Family notified: Mother-[name] Transport by: Mother   Per MD patient ready for DC to [facility]. RN to call report prior to discharge [phone]. RN, patient, patient's family, and facility notified of DC. Discharge Summary and FL2 sent to facility. DC packet on chart.   CSW will sign off for now as social work intervention is no longer needed. Please consult [place] again if new needs arise.       Barriers to Discharge: Insurance Authorization   Patient Goals and CMS Choice      Discharge Placement                         Discharge Plan and Services Additional resources added to the After Visit Summary for   In-house Referral: Clinical Social Work                                   Social Determinants of Health (SDOH) Interventions SDOH Screenings   Food Insecurity: No Food Insecurity (05/20/2023)  Housing: Low Risk  (05/20/2023)  Transportation Needs: No Transportation Needs (05/20/2023)  Utilities: Not At Risk (05/20/2023)  Financial Resource Strain: Low Risk  (04/03/2022)  Tobacco Use: Low Risk  (05/19/2023)     Readmission Risk Interventions    08/30/2022    1:46 PM 08/06/2022   10:01 AM  Readmission Risk Prevention Plan  Transportation Screening Complete   PCP or Specialist Appt within 3-5 Days Complete   HRI or Home Care Consult Complete   Social Work Consult for Recovery Care  Planning/Counseling Complete   Palliative Care Screening Not Applicable   Medication Review [employer]) Complete Complete  PCP or Specialist appointment within 3-5 days of discharge  Complete  HRI or Home Care Consult  Complete  SW Recovery Care/Counseling Consult  Complete  Palliative Care Screening  Not Applicable  Skilled Nursing Facility  Complete

## 2023-05-30 NOTE — Progress Notes (Signed)
  Daily Progress Note   Patient Name: [name]       Date: 05/30/2023 DOB: 04-05-1961  Age: 62 y.o. MRN#: [id number] Attending Physician: Almon Hercules, MD Primary Care Physician: Artis Delay, MD Admit Date: 05/19/2023 Length of Stay: 10 days  Reason for Consultation/Follow-up: Pain control  Subjective:   CC: Patient in pain after wound care change. Following up regarding pain management.   Subjective:  Reviewed EMR prior to presenting to bedside.  At time of EMR review patient had received oral oxycodone 20 mg every 4 hours as needed x 1 dose.  IR has not stated that nerve block will not be offered due to expanse of wounds.  As per EMR review, patient has been accepted to SNF today. Plan for discharge this afternoon Discussed care with RN for updates.   Presented to bedside later in afternoon. Patient notes pain currently after wound change. Discussed patient will be discharged on home regimen of dilaudid 8mg  since opioid rotation to oxycodone has not assisted with pain management. Noted patient should follow up with palliative medicine in the outpatient setting at [facility]. Wished patient safe travels to SNF today.   Review of Systems Pain in chest from metastatic breast cancer Objective:   Vital Signs:  BP 103/60 (BP Location: Left Arm)   Pulse 93   Temp 97.8 F (36.6 C) (Oral)   Resp 19   Ht 5\' 6"  (1.676 m)   Wt 55 kg   SpO2 98%   BMI 19.57 kg/m   Physical Exam: General: NAD, alert, laying in bed, chronically ill-appearing, frail Eyes: No drainage noted HENT: moist mucous membranes Cardiovascular: RRR, Respiratory: no increased work of breathing noted, not in respiratory distress Abdomen: not distended Neuro: A&Ox4, following commands easily Psych: appropriately answers all questions  Imaging:  I personally reviewed recent imaging.   Assessment & Plan:   Assessment: Patient is a 62 year old female with a past medical history of metastatic right-sided breast  cancer with ongoing weight loss and chest wall and back wounds who was admitted on 05/19/2023 from cancer center for cancer associated pain and chest wall wounds that were not improving.  Palliative medicine team consulted for pain management.  Recommendations/Plan:  # Symptom management:  -Pain, acute on chronic in the setting of metastatic breast cancer with chest wall and back wounds   -Continue MS Contin 30 mg every 8 hours scheduled   -Continue gabapentin to 300 mg 3 times daily   -Opioid rotation to oxycodone did not improve pain. Can resume home medication of po dilaudid 8mg  on discharge.    -Continue IV Dilaudid 2 mg as needed   -Continue topical diclofenac gel   -IR stating unable to perform nerve block due to expanse of wounds.   # Discharge Planning: Skilled Nursing Facility for rehab with Palliative care service follow-up  -Already placed referral for outpatient palliative medicine at [facility].  Discussed with: patient, RN  Thank you for allowing the palliative care team to participate in the care Deirdre Priest.  Alvester Morin, DO Palliative Care Provider PMT # [phone]  If patient remains symptomatic despite maximum doses, please call PMT at [phone] between 0700 and 1900. Outside of these hours, please call attending, as PMT does not have night coverage.  *Please note that this is a verbal dictation therefore any spelling or grammatical errors are due to the "[employer]agon [employer] One" system interpretation.

## 2023-06-01 LAB — HSV DNA BY PCR (REFERENCE LAB)
HSV 1 DNA: NEGATIVE
HSV 2 DNA: NEGATIVE

## 2023-06-03 ENCOUNTER — Telehealth: Payer: Self-pay | Admitting: Nurse Practitioner

## 2023-06-03 ENCOUNTER — Other Ambulatory Visit (HOSPITAL_COMMUNITY): Payer: Self-pay

## 2023-06-03 NOTE — Telephone Encounter (Signed)
Called per 9/3 referral message and spoke with patient and patient's sister. She is currently in a rehab facility that is working on her pain management. Currently not interested in coming here for those appointments.

## 2023-06-04 ENCOUNTER — Telehealth: Payer: Self-pay

## 2023-06-04 NOTE — Telephone Encounter (Signed)
Returned her call. She is at the nursing home and asking when she can resume chemo pill. Told her per Dr. Bertis Ruddy, she will see her back in the office next month to reassess her before resuming. She is gaining weight and will call the office back for questions/ concerns.

## 2023-06-05 NOTE — Progress Notes (Signed)
Call [name], ,mom and told her that the [employer] application has been completed. Left in envelope at the front desk. Mom/ sister will pick up Monday.

## 2023-06-08 ENCOUNTER — Inpatient Hospital Stay: Payer: BC Managed Care – PPO | Admitting: Nutrition

## 2023-06-08 ENCOUNTER — Other Ambulatory Visit (HOSPITAL_COMMUNITY): Payer: Self-pay

## 2023-06-11 ENCOUNTER — Other Ambulatory Visit (HOSPITAL_COMMUNITY): Payer: Self-pay

## 2023-06-11 ENCOUNTER — Telehealth: Payer: Self-pay

## 2023-06-11 NOTE — Telephone Encounter (Signed)
Returned call to [place]. She is asking if Dr. Bertis Ruddy recommends that [name] restart her chemo pill, she completed antibiotic on 9/5. Told her that Dr. Bertis Ruddy does not recommend that she restart chemo until she see her in the office evaluates wound and she has labs. Scheduled appt on 9/27, 1:15 pm lab and see Dr. Bertis Ruddy at 2 pm. [name] is aware of appt and agreeable.

## 2023-06-15 ENCOUNTER — Telehealth: Payer: Self-pay

## 2023-06-15 NOTE — Telephone Encounter (Signed)
Returned call to [place] and left a message. [name] recently had labs and asking if Dr. Bertis Ruddy wants results and if [name] can resume chemo.  Left a message that [name] cannot resume oral chemo until Dr. Bertis Ruddy sees her in the office and assesses wound/ labs.  Ask her to call the office back for questions.

## 2023-06-21 ENCOUNTER — Other Ambulatory Visit: Payer: Self-pay

## 2023-06-21 ENCOUNTER — Emergency Department (HOSPITAL_COMMUNITY): Payer: BC Managed Care – PPO

## 2023-06-21 ENCOUNTER — Inpatient Hospital Stay (HOSPITAL_COMMUNITY): Payer: BC Managed Care – PPO

## 2023-06-21 ENCOUNTER — Inpatient Hospital Stay (HOSPITAL_COMMUNITY)
Admission: EM | Admit: 2023-06-21 | Discharge: 2023-06-30 | DRG: 175 | Disposition: E | Payer: BC Managed Care – PPO | Source: Skilled Nursing Facility | Attending: Internal Medicine | Admitting: Internal Medicine

## 2023-06-21 ENCOUNTER — Encounter (HOSPITAL_COMMUNITY): Payer: Self-pay | Admitting: Emergency Medicine

## 2023-06-21 DIAGNOSIS — C50911 Malignant neoplasm of unspecified site of right female breast: Secondary | ICD-10-CM | POA: Diagnosis present

## 2023-06-21 DIAGNOSIS — E43 Unspecified severe protein-calorie malnutrition: Secondary | ICD-10-CM | POA: Diagnosis present

## 2023-06-21 DIAGNOSIS — L89153 Pressure ulcer of sacral region, stage 3: Secondary | ICD-10-CM | POA: Diagnosis present

## 2023-06-21 DIAGNOSIS — R6510 Systemic inflammatory response syndrome (SIRS) of non-infectious origin without acute organ dysfunction: Secondary | ICD-10-CM | POA: Diagnosis present

## 2023-06-21 DIAGNOSIS — Z1152 Encounter for screening for COVID-19: Secondary | ICD-10-CM | POA: Diagnosis not present

## 2023-06-21 DIAGNOSIS — C7951 Secondary malignant neoplasm of bone: Secondary | ICD-10-CM | POA: Diagnosis present

## 2023-06-21 DIAGNOSIS — A419 Sepsis, unspecified organism: Secondary | ICD-10-CM | POA: Diagnosis not present

## 2023-06-21 DIAGNOSIS — I10 Essential (primary) hypertension: Secondary | ICD-10-CM | POA: Diagnosis present

## 2023-06-21 DIAGNOSIS — R627 Adult failure to thrive: Secondary | ICD-10-CM | POA: Diagnosis present

## 2023-06-21 DIAGNOSIS — Z9221 Personal history of antineoplastic chemotherapy: Secondary | ICD-10-CM

## 2023-06-21 DIAGNOSIS — K219 Gastro-esophageal reflux disease without esophagitis: Secondary | ICD-10-CM | POA: Diagnosis present

## 2023-06-21 DIAGNOSIS — Z7189 Other specified counseling: Secondary | ICD-10-CM | POA: Diagnosis not present

## 2023-06-21 DIAGNOSIS — R451 Restlessness and agitation: Secondary | ICD-10-CM

## 2023-06-21 DIAGNOSIS — D75839 Thrombocytosis, unspecified: Secondary | ICD-10-CM | POA: Diagnosis present

## 2023-06-21 DIAGNOSIS — Z6822 Body mass index (BMI) 22.0-22.9, adult: Secondary | ICD-10-CM | POA: Diagnosis not present

## 2023-06-21 DIAGNOSIS — R54 Age-related physical debility: Secondary | ICD-10-CM | POA: Diagnosis present

## 2023-06-21 DIAGNOSIS — Z711 Person with feared health complaint in whom no diagnosis is made: Secondary | ICD-10-CM | POA: Diagnosis not present

## 2023-06-21 DIAGNOSIS — Z515 Encounter for palliative care: Secondary | ICD-10-CM

## 2023-06-21 DIAGNOSIS — I959 Hypotension, unspecified: Secondary | ICD-10-CM | POA: Diagnosis present

## 2023-06-21 DIAGNOSIS — K5909 Other constipation: Secondary | ICD-10-CM | POA: Diagnosis present

## 2023-06-21 DIAGNOSIS — F419 Anxiety disorder, unspecified: Secondary | ICD-10-CM | POA: Diagnosis present

## 2023-06-21 DIAGNOSIS — R64 Cachexia: Secondary | ICD-10-CM | POA: Diagnosis present

## 2023-06-21 DIAGNOSIS — I2699 Other pulmonary embolism without acute cor pulmonale: Secondary | ICD-10-CM | POA: Diagnosis present

## 2023-06-21 DIAGNOSIS — G893 Neoplasm related pain (acute) (chronic): Secondary | ICD-10-CM | POA: Diagnosis present

## 2023-06-21 DIAGNOSIS — R0681 Apnea, not elsewhere classified: Secondary | ICD-10-CM | POA: Diagnosis not present

## 2023-06-21 DIAGNOSIS — E860 Dehydration: Secondary | ICD-10-CM | POA: Diagnosis present

## 2023-06-21 DIAGNOSIS — Z79899 Other long term (current) drug therapy: Secondary | ICD-10-CM | POA: Diagnosis not present

## 2023-06-21 DIAGNOSIS — L03313 Cellulitis of chest wall: Secondary | ICD-10-CM | POA: Diagnosis present

## 2023-06-21 DIAGNOSIS — Z66 Do not resuscitate: Secondary | ICD-10-CM | POA: Diagnosis present

## 2023-06-21 DIAGNOSIS — L8990 Pressure ulcer of unspecified site, unspecified stage: Secondary | ICD-10-CM | POA: Diagnosis present

## 2023-06-21 DIAGNOSIS — Z8249 Family history of ischemic heart disease and other diseases of the circulatory system: Secondary | ICD-10-CM | POA: Diagnosis not present

## 2023-06-21 DIAGNOSIS — C800 Disseminated malignant neoplasm, unspecified: Secondary | ICD-10-CM | POA: Diagnosis not present

## 2023-06-21 DIAGNOSIS — R4589 Other symptoms and signs involving emotional state: Secondary | ICD-10-CM | POA: Diagnosis not present

## 2023-06-21 LAB — CBC WITH DIFFERENTIAL/PLATELET
Abs Immature Granulocytes: 0.02 10*3/uL (ref 0.00–0.07)
Basophils Absolute: 0 10*3/uL (ref 0.0–0.1)
Basophils Relative: 1 %
Eosinophils Absolute: 0 10*3/uL (ref 0.0–0.5)
Eosinophils Relative: 0 %
HCT: 40 % (ref 36.0–46.0)
Hemoglobin: 12.1 g/dL (ref 12.0–15.0)
Immature Granulocytes: 0 %
Lymphocytes Relative: 15 %
Lymphs Abs: 1.1 10*3/uL (ref 0.7–4.0)
MCH: 27.5 pg (ref 26.0–34.0)
MCHC: 30.3 g/dL (ref 30.0–36.0)
MCV: 90.9 fL (ref 80.0–100.0)
Monocytes Absolute: 1.1 10*3/uL — ABNORMAL HIGH (ref 0.1–1.0)
Monocytes Relative: 15 %
Neutro Abs: 5.1 10*3/uL (ref 1.7–7.7)
Neutrophils Relative %: 69 %
Platelets: 423 10*3/uL — ABNORMAL HIGH (ref 150–400)
RBC: 4.4 MIL/uL (ref 3.87–5.11)
RDW: 16.1 % — ABNORMAL HIGH (ref 11.5–15.5)
WBC: 7.3 10*3/uL (ref 4.0–10.5)
nRBC: 0 % (ref 0.0–0.2)

## 2023-06-21 LAB — COMPREHENSIVE METABOLIC PANEL
ALT: 9 U/L (ref 0–44)
AST: 56 U/L — ABNORMAL HIGH (ref 15–41)
Albumin: 2.2 g/dL — ABNORMAL LOW (ref 3.5–5.0)
Alkaline Phosphatase: 88 U/L (ref 38–126)
Anion gap: 7 (ref 5–15)
BUN: 45 mg/dL — ABNORMAL HIGH (ref 8–23)
CO2: 26 mmol/L (ref 22–32)
Calcium: 12.9 mg/dL — ABNORMAL HIGH (ref 8.9–10.3)
Chloride: 104 mmol/L (ref 98–111)
Creatinine, Ser: 0.82 mg/dL (ref 0.44–1.00)
GFR, Estimated: >60 mL/min (ref >60)
Glucose, Bld: 102 mg/dL — ABNORMAL HIGH (ref 70–99)
Potassium: 4.9 mmol/L (ref 3.5–5.1)
Sodium: 137 mmol/L (ref 135–145)
Total Bilirubin: 0.7 mg/dL (ref 0.3–1.2)
Total Protein: 6.6 g/dL (ref 6.5–8.1)

## 2023-06-21 LAB — I-STAT CG4 LACTIC ACID, ED: Lactic Acid, Venous: 3.2 mmol/L (ref 0.5–1.9)

## 2023-06-21 LAB — URINALYSIS, W/ REFLEX TO CULTURE (INFECTION SUSPECTED)
Bacteria, UA: NONE SEEN
Bilirubin Urine: NEGATIVE
Glucose, UA: NEGATIVE mg/dL
Hgb urine dipstick: NEGATIVE
Ketones, ur: NEGATIVE mg/dL
Leukocytes,Ua: NEGATIVE
Nitrite: NEGATIVE
Protein, ur: NEGATIVE mg/dL
Specific Gravity, Urine: >1.046 — ABNORMAL HIGH (ref 1.005–1.030)
pH: 6 (ref 5.0–8.0)

## 2023-06-21 LAB — PROTIME-INR
INR: 1.1 (ref 0.8–1.2)
Prothrombin Time: 14.2 seconds (ref 11.4–15.2)

## 2023-06-21 LAB — RESP PANEL BY RT-PCR (RSV, FLU A&B, COVID)  RVPGX2
Influenza A by PCR: NEGATIVE
Influenza B by PCR: NEGATIVE
Resp Syncytial Virus by PCR: NEGATIVE
SARS Coronavirus 2 by RT PCR: NEGATIVE

## 2023-06-21 LAB — APTT: aPTT: 26 seconds (ref 24–36)

## 2023-06-21 MED ORDER — CYPROHEPTADINE HCL 4 MG PO TABS
4.0000 mg | ORAL_TABLET | Freq: Every day | ORAL | Status: DC
  Administered 2023-06-21 – 2023-06-22 (×2): 4 mg via ORAL
  Filled 2023-06-21 (×3): qty 1

## 2023-06-21 MED ORDER — IOHEXOL 350 MG/ML SOLN
75.0000 mL | Freq: Once | INTRAVENOUS | Status: AC | PRN
  Administered 2023-06-21: 75 mL via INTRAVENOUS

## 2023-06-21 MED ORDER — VANCOMYCIN HCL IN DEXTROSE 1-5 GM/200ML-% IV SOLN
1000.0000 mg | Freq: Once | INTRAVENOUS | Status: AC
  Administered 2023-06-21: 1000 mg via INTRAVENOUS
  Filled 2023-06-21: qty 200

## 2023-06-21 MED ORDER — METRONIDAZOLE 0.75 % EX GEL
Freq: Two times a day (BID) | CUTANEOUS | Status: DC
  Filled 2023-06-21: qty 45

## 2023-06-21 MED ORDER — SODIUM CHLORIDE 0.9 % IV SOLN
2.0000 g | Freq: Once | INTRAVENOUS | Status: AC
  Administered 2023-06-21: 2 g via INTRAVENOUS
  Filled 2023-06-21: qty 20

## 2023-06-21 MED ORDER — HYDROMORPHONE HCL 1 MG/ML IJ SOLN
1.0000 mg | INTRAMUSCULAR | Status: DC | PRN
  Administered 2023-06-21 – 2023-06-22 (×5): 1 mg via INTRAVENOUS
  Filled 2023-06-21 (×5): qty 1

## 2023-06-21 MED ORDER — TRAZODONE HCL 50 MG PO TABS: 25.0000 mg | ORAL_TABLET | Freq: Every day | ORAL | Status: DC

## 2023-06-21 MED ORDER — ENOXAPARIN SODIUM 80 MG/0.8ML IJ SOSY
70.0000 mg | PREFILLED_SYRINGE | Freq: Two times a day (BID) | INTRAMUSCULAR | Status: DC
  Administered 2023-06-21 – 2023-06-23 (×4): 70 mg via SUBCUTANEOUS
  Filled 2023-06-21 (×4): qty 0.8

## 2023-06-21 MED ORDER — ACETAMINOPHEN 325 MG PO TABS: 650.0000 mg | ORAL_TABLET | Freq: Four times a day (QID) | ORAL | Status: DC | PRN

## 2023-06-21 MED ORDER — LACTATED RINGERS IV SOLN: 150.0000 mL/h | INTRAVENOUS | Status: DC

## 2023-06-21 MED ORDER — SODIUM CHLORIDE 0.9 % IV SOLN
2.0000 g | INTRAVENOUS | Status: DC
  Administered 2023-06-22: 2 g via INTRAVENOUS
  Filled 2023-06-21: qty 20

## 2023-06-21 MED ORDER — BACLOFEN 10 MG PO TABS
5.0000 mg | ORAL_TABLET | Freq: Two times a day (BID) | ORAL | Status: DC
  Administered 2023-06-21 – 2023-06-23 (×4): 5 mg via ORAL
  Filled 2023-06-21 (×4): qty 1

## 2023-06-21 MED ORDER — PANTOPRAZOLE SODIUM 40 MG PO TBEC
40.0000 mg | DELAYED_RELEASE_TABLET | Freq: Every day | ORAL | Status: DC
  Administered 2023-06-21 – 2023-06-23 (×3): 40 mg via ORAL
  Filled 2023-06-21 (×3): qty 1

## 2023-06-21 MED ORDER — LACTATED RINGERS IV BOLUS (SEPSIS)
500.0000 mL | Freq: Once | INTRAVENOUS | Status: AC
  Administered 2023-06-21: 500 mL via INTRAVENOUS

## 2023-06-21 MED ORDER — MIRTAZAPINE 15 MG PO TABS
7.5000 mg | ORAL_TABLET | Freq: Every day | ORAL | Status: DC
  Administered 2023-06-21 – 2023-06-22 (×2): 7.5 mg via ORAL
  Filled 2023-06-21 (×2): qty 1

## 2023-06-21 MED ORDER — ONDANSETRON HCL 4 MG/2ML IJ SOLN: 4.0000 mg | Freq: Four times a day (QID) | INTRAMUSCULAR | Status: DC | PRN

## 2023-06-21 MED ORDER — ENSURE ENLIVE PO LIQD
237.0000 mL | Freq: Two times a day (BID) | ORAL | Status: DC
  Administered 2023-06-22 – 2023-06-23 (×4): 237 mL via ORAL

## 2023-06-21 MED ORDER — ZOLEDRONIC ACID 4 MG/100ML IV SOLN
4.0000 mg | Freq: Once | INTRAVENOUS | Status: AC
  Administered 2023-06-21: 4 mg via INTRAVENOUS
  Filled 2023-06-21: qty 100

## 2023-06-21 MED ORDER — LACTATED RINGERS IV BOLUS (SEPSIS)
250.0000 mL | Freq: Once | INTRAVENOUS | Status: AC
  Administered 2023-06-21: 250 mL via INTRAVENOUS

## 2023-06-21 MED ORDER — ACETAMINOPHEN 650 MG RE SUPP: 650.0000 mg | Freq: Four times a day (QID) | RECTAL | Status: DC | PRN

## 2023-06-21 MED ORDER — FENTANYL CITRATE PF 50 MCG/ML IJ SOSY
100.0000 ug | PREFILLED_SYRINGE | Freq: Once | INTRAMUSCULAR | Status: AC
  Administered 2023-06-21: 100 ug via INTRAVENOUS
  Filled 2023-06-21: qty 2

## 2023-06-21 MED ORDER — LACTATED RINGERS IV SOLN: INTRAVENOUS | Status: DC

## 2023-06-21 MED ORDER — VANCOMYCIN HCL 1250 MG/250ML IV SOLN
1250.0000 mg | INTRAVENOUS | Status: DC
  Administered 2023-06-22: 1250 mg via INTRAVENOUS
  Filled 2023-06-21: qty 250

## 2023-06-21 MED ORDER — LACTATED RINGERS IV BOLUS (SEPSIS)
1000.0000 mL | Freq: Once | INTRAVENOUS | Status: AC
  Administered 2023-06-21: 1000 mL via INTRAVENOUS

## 2023-06-21 MED ORDER — ONDANSETRON HCL 4 MG PO TABS: 4.0000 mg | ORAL_TABLET | Freq: Four times a day (QID) | ORAL | Status: DC | PRN

## 2023-06-21 NOTE — H&P (Signed)
History and Physical    Patient: [name] MWU:[id number] DOB: 04-05-1961 DOA: 06/22/2023 DOS: the patient was seen and examined on 06/17/2023 PCP: Artis Delay, MD  Patient coming from: Home  Chief Complaint:  Chief Complaint  Patient presents with   Failure To Thrive   HPI: [name] is a 62 y.o. 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  She has right-sided chest wall pain from her open wounds but no precordial/deep discomfort.  She has been very dyspneic.  She denied fever, chills, rhinorrhea, sore throat, wheezing or hemoptysis.  No palpitations, diaphoresis, PND or orthopnea.  Decreased appetite, frequent constipation, no current abdominal pain, nausea, emesis, diarrhea, melena or hematochezia.  No flank pain, dysuria, frequency or hematuria.  No polyuria, polydipsia, polyphagia or blurred vision.   Lab work: CBC showed white count 7.3, hemoglobin 12.1 g/dL platelets 725.  Normal PT, INR and PTT.  CMP showed normal electrolytes except for calcium of 12.9 mg/dL.  Albumin was 2.2 g/deciliter, AST 56 units/L, glucose 102, BUN 45 and creatinine 0.82 mg/dL.  Coronavirus, influenza and RSV PCR was negative.  Imaging: Portable 1 view chest radiograph showed stable small layering right pleural effusion and right lower lobe lung infiltrates or atelectasis.  CTA chest showed multifocal bilateral pulmonary emboli extending from distal lobar arteries into all lobes.  Evidence of right heart strain.  New groundglass opacity in the medial left apex for peribronchial left lower lobe, which or secondary to pulmonary unchanged right  pleural effusion with a look to continue progression of cutaneous thickening no drainable fluid collection.  No gaseous component.  Right hilar lymphadenopathy.  Unchanged sclerotic appearance of the sternum and right anterior a reft, suspicious for osseous metastasis.  Increased diffuse body wall edema.   ED course: Initial vital signs were temperature 97.7 F, pulse 120, respirations 12, BP 112/65 mmHg O2 sat 90% on room air.  The patient received 1000 mg of vancomycin, ceftriaxone 2 g IVPB, fentanyl 100 mcg IVP and 2250 mL of LR bolus.  Review of Systems: As mentioned in the history of present illness. All other systems reviewed and are negative.  Past Medical History:  Diagnosis Date   Hypertension    rt breast ca 02/2022   Past Surgical History:  Procedure Laterality Date   ANKLE SURGERY     IR THORACENTESIS ASP PLEURAL SPACE W/IMG GUIDE  04/10/2022   IR US GUIDE BX ASP/DRAIN  03/24/2022   Social History:  reports that she has never smoked. She has never used smokeless tobacco. She reports that she does not drink alcohol and does not use drugs.  No Known Allergies  Family History  Problem Relation Age of Onset   Hypertension Mother    Heart disease Father    Stroke Father    Kidney disease Father    Hypertension Sister    Hypertension Maternal Grandmother    Stroke Maternal Grandfather    Diabetes Paternal Grandmother    Cancer Cousin     Prior to Admission medications   Medication Sig Start Date End Date Taking? Authorizing Provider  acetaminophen (TYLENOL) 500 MG tablet Take 1 tablet (500 mg total) by mouth every 6 (six) hours. 05/25/23   Regalado, [employer]  A, MD  calcium carbonate (TUMS - DOSED IN MG ELEMENTAL CALCIUM) 500 MG chewable tablet Chew 500 mg by mouth 2 (two) times daily.    [provider]  cholecalciferol (VITAMIN D3) 25 MCG (1000 UNIT) tablet Take 1,000 Units by mouth daily.    [provider]  feeding supplement (ENSURE ENLIVE / ENSURE PLUS)  LIQD Take 237 mLs by mouth 2 (two) times daily between meals. 05/30/23   Almon Hercules, MD  gabapentin (NEURONTIN) 300 MG capsule Take 1 capsule (300 mg total) by mouth 3 (three) times daily. 05/30/23   Almon Hercules, MD  HYDROmorphone (DILAUDID) 8 MG tablet Take 1 tablet (8 mg total) by mouth every 3 (three) hours as needed for severe pain. 05/25/23   Regalado, Belkys A, MD  magnesium oxide (MAG-OX) 400 (240 Mg) MG tablet Take 1 tablet (400 mg total) by mouth daily. 06/12/22   Artis Delay, MD  metroNIDAZOLE (METROGEL) 0.75 % gel Apply topically daily. 05/26/23   Regalado, Belkys A, MD  Multiple Vitamins-Minerals (MULTIVITAMIN WITH MINERALS) tablet Take 1 tablet by mouth daily.    [provider]  nutrition supplement, JUVEN, (JUVEN) PACK Take 1 packet by mouth 2 (two) times daily between meals. 05/30/23   Almon Hercules, MD  polyethylene glycol (MIRALAX / GLYCOLAX) 17 g packet Take 17 g by mouth 2 (two) times daily. 05/25/23   Regalado, Belkys A, MD  senna (SENOKOT) 8.6 MG tablet Take 1 tablet by mouth as needed for constipation.    [provider]    Physical Exam: Vitals:   06/18/2023 1157 06/15/2023 1226 06/20/2023 1226  BP: 112/65    Pulse: (!) 120    Temp: 97.7 F (36.5 C) (S) 98.7 F (37.1 C) 98.7 F (37.1 C)  TempSrc: Oral (S) Rectal Rectal  SpO2: 90%     Physical Exam Vitals and nursing note reviewed.  Constitutional:      Appearance: Normal appearance. She is ill-appearing.     Interventions: Nasal cannula in place.  HENT:     Head: Normocephalic.     Nose: No rhinorrhea.     Mouth/Throat:     Mouth: Mucous membranes are moist.  Eyes:     General: No scleral icterus.    Pupils: Pupils are equal, round, and reactive to light.  Neck:     Vascular: No JVD.  Cardiovascular:     Rate and Rhythm: Normal rate and regular rhythm.     Heart sounds: S1 normal and S2 normal.  Pulmonary:     Effort: Pulmonary effort is normal.     Breath sounds: Normal breath sounds.   Abdominal:     General: Bowel sounds are normal. There is no distension.     Palpations: Abdomen is soft.     Tenderness: There is no abdominal tenderness.  Musculoskeletal:     Cervical back: Neck supple.     Right lower leg: No edema.     Left lower leg: No edema.  Skin:    General: Skin is warm and dry.     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  Please see pictures below.  Neurological:     General: No focal deficit present.     Mental Status: She is alert and oriented to person, place, and time.  Psychiatric:        Mood and Affect: Mood normal.        Behavior: Behavior normal. Behavior is cooperative.  Data Reviewed:  Results are pending, will review when available. 08/05/2022 transthoracic echocardiogram IMPRESSIONS:   1. Left ventricular ejection fraction, by estimation, is 55 to 60%. The  left ventricle has normal function. The left ventricle has no regional  wall motion abnormalities. Left ventricular diastolic parameters were  normal.   2. Right ventricular systolic function is normal. The right ventricular  size is normal. Mildly increased right ventricular wall thickness. There  is mildly elevated pulmonary artery systolic pressure. The estimated right  ventricular systolic pressure is  43.7 mmHg.   3. Left atrial size was mildly dilated.   4. A small pericardial effusion is present. The pericardial effusion is  circumferential. There is no evidence of cardiac tamponade. Large pleural  effusion in the right lateral region.   5. The mitral valve is myxomatous. Mild mitral valve regurgitation. 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 Tricuspid valve regurgitation is  mild to moderate.   7. The aortic valve is tricuspid. Aortic valve regurgitation is not  visualized. No aortic stenosis is present.   8. The inferior vena  cava is normal in size with greater than 50%  respiratory variability, suggesting right atrial pressure of 3 mmHg.   EKG: Vent. rate 115 BPM PR interval 141 ms QRS duration 76 ms QT/QTcB 304/421 ms P-R-T axes 30 15 39 Sinus tachycardia Low voltage, precordial leads  Assessment and Plan: Principal Problem:   Sepsis due to undetermined organism (HCC) Secondary to:   Cellulitis of chest wall In the setting of cutaneous Carcinomatosis (HCC) Questionable infiltrates versus pulmonary infarcts on imaging. Admit to PCU/inpatient. Continue IV fluids. Continue ceftriaxone Continue vancomycin per pharmacy. Follow-up blood culture and sensitivity Follow CBC and CMP in a.m.  Active Problems:   Bilateral pulmonary embolism (HCC) Lovenox SQ to minimize blood draws. Will transition to DOAC. Check bilateral lower extremity Doppler. Check echocardiogram.    Hypercalcemia of malignancy Continue IV fluids. Zoledronic acid 4 mg IVPB x 1. Follow-up calcium level in AM.    Thrombocytosis In the setting of cancer. Follow-up platelet count.    Breast cancer metastasized to bone, right [facility]) Consult palliative care in AM.    Essential hypertension No need for antihypertensives after weight loss. Monitor blood pressure per unit routine.    Other constipation In the setting of poor appetite. Treat with laxatives as needed.    GERD (gastroesophageal reflux disease) Begin prophylactic pantoprazole 40 mg p.o. daily.    Protein-calorie malnutrition, severe Protein supplementation. Discontinue trazodone. Appetite stimulant -Start mirtazapine 7.5 mg at bedtime. -Begin Periactin 4 mg p.o. at bedtime.    Pressure injury of skin Consult wound care.    Advance Care Planning:   Code Status: Limited: Do not attempt resuscitation (DNR) -DNR-LIMITED -Do Not Intubate/DNI    Consults: Wound/ostomy care and palliative medicine consults requested.  Family Communication: Her mother was at  bedside.  Severity of Illness: The appropriate patient status for this patient is INPATIENT. Inpatient status is judged to be reasonable and necessary in order to provide the required intensity of service to ensure the patient's safety. The patient's presenting symptoms, physical exam findings, and initial radiographic and laboratory data in the context of their chronic comorbidities is felt to place them at high risk for further clinical deterioration. Furthermore, it is not anticipated that the patient will be medically stable for discharge from the hospital within 2 midnights of admission.   * I  certify that at the point of admission it is my clinical judgment that the patient will require inpatient hospital care spanning beyond 2 midnights from the point of admission due to high intensity of service, high risk for further deterioration and high frequency of surveillance required.*  Author: Bobette Mo, MD 07/12/23 2:50 PM  For on call review [email or web address].   This document was prepared using Dragon voice recognition software and may contain some unintended transcription errors.

## 2023-06-21 NOTE — Sepsis Progress Note (Signed)
Elink following code sepsis °

## 2023-06-21 NOTE — Consult Note (Signed)
WOC Nurse Consult Note: Reason for Consult:Unstageable pressure injury to sacrum.  Fungating tumor to breast and right posterior flank.  Wound type: pressure and malignant Pressure Injury POA: Yes Measurement: 4 cm x 3.4 cm slough to wound bed Wound bed: slough Drainage (amount, consistency, odor) minimal serosanguinous   Periwound: intact  Dressing procedure/placement/frequency: Cleanse sacral wound with VASHE cleanser and pat dry. Apply VASHE moist gauze.  Cover with foam dressing.  Change daily.  Will continue orders for breast and flank with Xeroform and mesh netting.  Flagyl gel as needed for odor.  Will not follow at this time.  Please re-consult if needed.  Mike Gip MSN, RN, FNP-BC CWON Wound, Ostomy, Continence Nurse Outpatient [facility] [phone] Pager [phone]

## 2023-06-21 NOTE — Progress Notes (Signed)
A consult was received from an ED physician for vancomycin per pharmacy dosing.  The patient's profile has been reviewed for ht/wt/allergies/indication/available labs.    A one time order has been placed for vancomycin 1000 mg IV x1.  Further antibiotics/pharmacy consults should be ordered by admitting physician if indicated.                       Thank you, Lucia Gaskins 06/22/2023  12:34 PM

## 2023-06-21 NOTE — ED Notes (Signed)
98.7 F (37.1 C)   TempSrc: Oral (S) Rectal Rectal   SpO2: 90%     PainSc:    10-Worst pain ever    Isolation Precautions No active isolations  Medications Medications  lactated ringers infusion ( Intravenous New Bag/Given 06/06/2023 1248)  lactated ringers bolus 1,000 mL (1,000 mLs Intravenous New Bag/Given 06/09/2023 1316)    And  lactated ringers bolus 500 mL (500 mLs Intravenous New Bag/Given 06/29/2023 1316)    And  lactated ringers bolus 250 mL (250 mLs Intravenous New Bag/Given 06/10/2023 1317)  vancomycin (VANCOCIN) IVPB 1000 mg/200 mL premix (1,000 mg Intravenous New Bag/Given 06/28/2023 1342)  cefTRIAXone (ROCEPHIN) 2 g in sodium chloride 0.9 % 100 mL IVPB (2 g Intravenous New Bag/Given 06/14/2023 1255)  fentaNYL (SUBLIMAZE) injection 100 mcg (100 mcg Intravenous Given 06/18/2023 1242)  iohexol (OMNIPAQUE) 350 MG/ML injection 75 mL (75 mLs Intravenous Contrast Given 06/07/2023 1444)    Mobility non-ambulatory     Focused Assessments Cardiac Assessment Handoff:    No results found for: "CKTOTAL", "CKMB", "CKMBINDEX", "TROPONINI" No results found for: "DDIMER" Does the Patient currently have chest pain? No    R Recommendations: See Admitting Provider Note  Report given to:   Additional Notes:  AST 56 (H) 15 - 41 U/L   ALT 9 0 - 44 U/L   Alkaline Phosphatase 88 38 - 126 U/L   Total Bilirubin 0.7 0.3 - 1.2 mg/dL   GFR, Estimated >56 >21 mL/min    Comment: (NOTE) Calculated using the CKD-EPI Creatinine Equation (2021)    Anion gap 7 5 - 15    Comment: Performed at [facility], [address], [place], [place] [address]  CBC with Differential     Status: Abnormal   Collection Time: 06/09/2023 12:16 PM  Result Value Ref Range   WBC 7.3 4.0 - 10.5 K/uL   RBC 4.40 3.87 - 5.11 MIL/uL   Hemoglobin 12.1 12.0 - 15.0 g/dL   HCT 78.4 69.6 - 29.5 %   MCV 90.9 80.0 - 100.0 fL   MCH 27.5 26.0 - 34.0  pg   MCHC 30.3 30.0 - 36.0 g/dL   RDW 28.4 (H) 13.2 - 44.0 %   Platelets 423 (H) 150 - 400 K/uL   nRBC 0.0 0.0 - 0.2 %   Neutrophils Relative % 69 %   Neutro Abs 5.1 1.7 - 7.7 K/uL   Lymphocytes Relative 15 %   Lymphs Abs 1.1 0.7 - 4.0 K/uL   Monocytes Relative 15 %   Monocytes Absolute 1.1 (H) 0.1 - 1.0 K/uL   Eosinophils Relative 0 %   Eosinophils Absolute 0.0 0.0 - 0.5 K/uL   Basophils Relative 1 %   Basophils Absolute 0.0 0.0 - 0.1 K/uL   Immature Granulocytes 0 %   Abs Immature Granulocytes 0.02 0.00 - 0.07 K/uL    Comment: Performed at [facility], [address], [place], [place] [address]  Protime-INR     Status: None   Collection Time: 05/31/2023 12:16 PM  Result Value Ref Range   Prothrombin Time 14.2 11.4 - 15.2 seconds   INR 1.1 0.8 - 1.2    Comment: (NOTE) INR goal varies based on device and disease states. Performed at [facility], [address], [place], [place] [address]   APTT     Status: None   Collection Time: 06/11/2023 12:16 PM  Result Value Ref Range   aPTT 26 24 - 36 seconds    Comment: Performed at [facility], [address] [place], [place] [address]  I-Stat Lactic Acid, ED     Status: Abnormal   Collection Time: 06/20/2023 12:40 PM  Result Value Ref Range   Lactic Acid, Venous 3.2 (HH) 0.5 - 1.9 mmol/L   Comment NOTIFIED PHYSICIAN   Resp panel by RT-PCR (RSV, Flu A&B, Covid) Anterior Nasal Swab     Status: None   Collection Time: 06/02/2023  1:15 PM   Specimen: Anterior Nasal Swab  Result Value Ref Range   SARS Coronavirus 2 by RT PCR NEGATIVE NEGATIVE    Comment: (NOTE) SARS-CoV-2 target nucleic acids are NOT DETECTED.  The SARS-CoV-2 RNA is generally detectable in upper respiratory specimens during the acute phase of infection. The lowest concentration of SARS-CoV-2 viral copies this assay can detect is 138 copies/mL. A negative result does not preclude SARS-Cov-2 infection and should  not be used as the sole basis for treatment or other patient management decisions. A negative result may occur with  improper specimen collection/handling, submission of specimen other than nasopharyngeal swab, presence of viral mutation(s) within the areas targeted by this assay, and inadequate number of viral copies(<138 copies/mL). A negative result must be combined with clinical  98.7 F (37.1 C)   TempSrc: Oral (S) Rectal Rectal   SpO2: 90%     PainSc:    10-Worst pain ever    Isolation Precautions No active isolations  Medications Medications  lactated ringers infusion ( Intravenous New Bag/Given 06/06/2023 1248)  lactated ringers bolus 1,000 mL (1,000 mLs Intravenous New Bag/Given 06/09/2023 1316)    And  lactated ringers bolus 500 mL (500 mLs Intravenous New Bag/Given 06/29/2023 1316)    And  lactated ringers bolus 250 mL (250 mLs Intravenous New Bag/Given 06/10/2023 1317)  vancomycin (VANCOCIN) IVPB 1000 mg/200 mL premix (1,000 mg Intravenous New Bag/Given 06/28/2023 1342)  cefTRIAXone (ROCEPHIN) 2 g in sodium chloride 0.9 % 100 mL IVPB (2 g Intravenous New Bag/Given 06/14/2023 1255)  fentaNYL (SUBLIMAZE) injection 100 mcg (100 mcg Intravenous Given 06/18/2023 1242)  iohexol (OMNIPAQUE) 350 MG/ML injection 75 mL (75 mLs Intravenous Contrast Given 06/07/2023 1444)    Mobility non-ambulatory     Focused Assessments Cardiac Assessment Handoff:    No results found for: "CKTOTAL", "CKMB", "CKMBINDEX", "TROPONINI" No results found for: "DDIMER" Does the Patient currently have chest pain? No    R Recommendations: See Admitting Provider Note  Report given to:   Additional Notes:  AST 56 (H) 15 - 41 U/L   ALT 9 0 - 44 U/L   Alkaline Phosphatase 88 38 - 126 U/L   Total Bilirubin 0.7 0.3 - 1.2 mg/dL   GFR, Estimated >56 >21 mL/min    Comment: (NOTE) Calculated using the CKD-EPI Creatinine Equation (2021)    Anion gap 7 5 - 15    Comment: Performed at [facility], [address], [place], [place] [address]  CBC with Differential     Status: Abnormal   Collection Time: 06/09/2023 12:16 PM  Result Value Ref Range   WBC 7.3 4.0 - 10.5 K/uL   RBC 4.40 3.87 - 5.11 MIL/uL   Hemoglobin 12.1 12.0 - 15.0 g/dL   HCT 78.4 69.6 - 29.5 %   MCV 90.9 80.0 - 100.0 fL   MCH 27.5 26.0 - 34.0  pg   MCHC 30.3 30.0 - 36.0 g/dL   RDW 28.4 (H) 13.2 - 44.0 %   Platelets 423 (H) 150 - 400 K/uL   nRBC 0.0 0.0 - 0.2 %   Neutrophils Relative % 69 %   Neutro Abs 5.1 1.7 - 7.7 K/uL   Lymphocytes Relative 15 %   Lymphs Abs 1.1 0.7 - 4.0 K/uL   Monocytes Relative 15 %   Monocytes Absolute 1.1 (H) 0.1 - 1.0 K/uL   Eosinophils Relative 0 %   Eosinophils Absolute 0.0 0.0 - 0.5 K/uL   Basophils Relative 1 %   Basophils Absolute 0.0 0.0 - 0.1 K/uL   Immature Granulocytes 0 %   Abs Immature Granulocytes 0.02 0.00 - 0.07 K/uL    Comment: Performed at [facility], [address], [place], [place] [address]  Protime-INR     Status: None   Collection Time: 05/31/2023 12:16 PM  Result Value Ref Range   Prothrombin Time 14.2 11.4 - 15.2 seconds   INR 1.1 0.8 - 1.2    Comment: (NOTE) INR goal varies based on device and disease states. Performed at [facility], [address], [place], [place] [address]   APTT     Status: None   Collection Time: 06/11/2023 12:16 PM  Result Value Ref Range   aPTT 26 24 - 36 seconds    Comment: Performed at [facility], [address] [place], [place] [address]  I-Stat Lactic Acid, ED     Status: Abnormal   Collection Time: 06/20/2023 12:40 PM  Result Value Ref Range   Lactic Acid, Venous 3.2 (HH) 0.5 - 1.9 mmol/L   Comment NOTIFIED PHYSICIAN   Resp panel by RT-PCR (RSV, Flu A&B, Covid) Anterior Nasal Swab     Status: None   Collection Time: 06/02/2023  1:15 PM   Specimen: Anterior Nasal Swab  Result Value Ref Range   SARS Coronavirus 2 by RT PCR NEGATIVE NEGATIVE    Comment: (NOTE) SARS-CoV-2 target nucleic acids are NOT DETECTED.  The SARS-CoV-2 RNA is generally detectable in upper respiratory specimens during the acute phase of infection. The lowest concentration of SARS-CoV-2 viral copies this assay can detect is 138 copies/mL. A negative result does not preclude SARS-Cov-2 infection and should  not be used as the sole basis for treatment or other patient management decisions. A negative result may occur with  improper specimen collection/handling, submission of specimen other than nasopharyngeal swab, presence of viral mutation(s) within the areas targeted by this assay, and inadequate number of viral copies(<138 copies/mL). A negative result must be combined with clinical

## 2023-06-21 NOTE — Progress Notes (Signed)
ANTICOAGULATION CONSULT NOTE - Initial Consult  Pharmacy Consult for Lovenox Indication: pulmonary embolus  No Known Allergies  Patient Measurements: Weight: 64.2 kg (141 lb 8.6 oz)  Vital Signs: Temp: 98.6 F (37 C) (09/22 1628) Temp Source: Oral (09/22 1628) BP: 116/69 (09/22 1628) Pulse Rate: 100 (09/22 1628)  Labs: Recent Labs    06/03/2023 1216  HGB 12.1  HCT 40.0  PLT 423*  APTT 26  LABPROT 14.2  INR 1.1  CREATININE 0.82    Estimated Creatinine Clearance: 65.7 mL/min (by C-G formula based on SCr of 0.82 mg/dL).   Medical History: Past Medical History:  Diagnosis Date   Breast cancer, stage 4, right (HCC) 03/22/2022   Empyema, right (HCC) 08/11/2022   Hypertension    rt breast ca 02/2022   Medications:  Medications Prior to Admission  Medication Sig Dispense Refill Last Dose   acetaminophen (TYLENOL) 500 MG tablet Take 1 tablet (500 mg total) by mouth every 6 (six) hours. 30 tablet 0 06/16/2023   Baclofen 5 MG TABS Take 5 mg by mouth every 12 (twelve) hours.   06/23/2023   feeding supplement (ENSURE ENLIVE / ENSURE PLUS) LIQD Take 237 mLs by mouth 2 (two) times daily between meals.   06/14/2023   HYDROmorphone (DILAUDID) 8 MG tablet Take 1 tablet (8 mg total) by mouth every 3 (three) hours as needed for severe pain. (Patient taking differently: Take 8 mg by mouth daily as needed for severe pain.) 30 tablet 0 06/20/2023   metroNIDAZOLE (METROGEL) 0.75 % gel Apply topically daily. 45 g 0 unknown   nutrition supplement, JUVEN, (JUVEN) PACK Take 1 packet by mouth 2 (two) times daily between meals.   unknown   traZODone (DESYREL) 50 MG tablet Take 25 mg by mouth at bedtime.   06/20/2023   calcium carbonate (TUMS - DOSED IN MG ELEMENTAL CALCIUM) 500 MG chewable tablet Chew 500 mg by mouth 2 (two) times daily. (Patient not taking: Reported on 06/07/2023)   Not Taking   cholecalciferol (VITAMIN D3) 25 MCG (1000 UNIT) tablet Take 1,000 Units by mouth daily. (Patient not  taking: Reported on 06/11/2023)   Not Taking   gabapentin (NEURONTIN) 300 MG capsule Take 1 capsule (300 mg total) by mouth 3 (three) times daily. (Patient not taking: Reported on 06/13/2023)   Not Taking   magnesium oxide (MAG-OX) 400 (240 Mg) MG tablet Take 1 tablet (400 mg total) by mouth daily. (Patient not taking: Reported on 06/15/2023) 30 tablet 3 Not Taking   Multiple Vitamins-Minerals (MULTIVITAMIN WITH MINERALS) tablet Take 1 tablet by mouth daily. (Patient not taking: Reported on 06/27/2023)   Not Taking   polyethylene glycol (MIRALAX / GLYCOLAX) 17 g packet Take 17 g by mouth 2 (two) times daily. (Patient not taking: Reported on 06/16/2023) 14 each 0 Not Taking   senna (SENOKOT) 8.6 MG tablet Take 1 tablet by mouth as needed for constipation. (Patient not taking: Reported on 06/03/2023)   Not Taking   Scheduled:   baclofen  5 mg Oral Q12H   cyproheptadine  4 mg Oral QHS   mirtazapine  7.5 mg Oral QHS   zoledronic acid (ZOMETA) IVPB Oncology  4 mg Intravenous Once    Assessment: 62 YO female with an infected chest wound. 9/22 CT PE also showing multifocal bilateral pulmonary emboli extending from distal lobar arteries into all lobes. Patient not on anticoagulation prior to admission. Pharmacy consulted for Lovenox dosing.   Today, 9/22: Scr 0.82; CrCl ~65 ml/min--stable Hgb 12.1, plts  423--stable No s/sx of bleeding noted  Goal of Therapy:  Monitor platelets by anticoagulation protocol: Yes   Plan:  Start Lovenox 1mg /kg [place] q12hrs  Monitor renal function and s/sx of bleeding F/u transition to PO anticoagulation    Cherylin Mylar, PharmD Clinical Pharmacist  10-05-245:42 PM

## 2023-06-21 NOTE — Sepsis Progress Note (Signed)
Notified provider of need to consider ordering a repeat lactic acid. Provider stated that pt declined to have lab drawn.

## 2023-06-21 NOTE — Progress Notes (Signed)
Pharmacy Antibiotic Note  [name] is a 62 y.o. female admitted on 06/15/2023 with  purulent chest wound . Pharmacy has been consulted for vancomycin dosing.  Patient received vancomycin 1000mg  x1 in the ED 9/22 @1342 .   Plan: - Start vancomycin 1250mg  q24hrs (eAUC 458 using Scr 0.82, IBW, and Vd 0.72)  - Vanc levels PRN - Monitor cultures, renal function, and overall clinical picture  - De-escalate antibiotics as able  Weight: 64.2 kg (141 lb 8.6 oz)  Temp (24hrs), Avg:98.4 F (36.9 C), Min:97.7 F (36.5 C), Max:98.7 F (37.1 C)  Recent Labs  Lab 06/25/2023 1216 06/03/2023 1240  WBC 7.3  --   CREATININE 0.82  --   LATICACIDVEN  --  3.2*    Estimated Creatinine Clearance: 65.7 mL/min (by C-G formula based on SCr of 0.82 mg/dL).    No Known Allergies  Antimicrobials this admission: 9/22 ceftriaxone 2g x1 9/22 vancomycin >>  Dose adjustments this admission: N/A  Microbiology results: 9/22 BCx: in process   Thank you for allowing pharmacy to be a part of this patient's care.  Cherylin Mylar, PharmD Clinical Pharmacist  09/23/20245:56 PM

## 2023-06-21 NOTE — ED Triage Notes (Signed)
Patient from nursing facility with failure to thrive past hx of breast cancer. Infection open wound on chest looks purilent. rt arm restricted.  No N/V no chest pain . No abnormal lungs sounds Pitting edema BLE. Pain at wound site on chest.

## 2023-06-21 NOTE — ED Provider Notes (Signed)
EMERGENCY DEPARTMENT AT [facility] Provider Note   CSN: [id number] Arrival date & time: 07/07/2023  1140     History  Chief Complaint  Patient presents with   Abscess    Wound upper chest    [name] is a 62 y.o. female.  62 year old female with history of breast cancer with mets to her chest, chronic wound presents with increased weakness for about a week.  Patient also notes increased pain to her prior cancer related lesions.  No reported fever.  Pain unrelieved with her home hydromorphone which she takes.  EMS called and patient felt to meet sepsis criteria.  Started on IV fluids that she was hypotensive and blood pressure in the 80s.  Transported here for further management       Home Medications Prior to Admission medications   Medication Sig Start Date End Date Taking? Authorizing Provider  acetaminophen (TYLENOL) 500 MG tablet Take 1 tablet (500 mg total) by mouth every 6 (six) hours. 05/25/23   Regalado, Belkys A, MD  calcium carbonate (TUMS - DOSED IN MG ELEMENTAL CALCIUM) 500 MG chewable tablet Chew 500 mg by mouth 2 (two) times daily.    [provider]  cholecalciferol (VITAMIN D3) 25 MCG (1000 UNIT) tablet Take 1,000 Units by mouth daily.    [provider]  feeding supplement (ENSURE ENLIVE / ENSURE PLUS) LIQD Take 237 mLs by mouth 2 (two) times daily between meals. 05/30/23   Almon Hercules, MD  gabapentin (NEURONTIN) 300 MG capsule Take 1 capsule (300 mg total) by mouth 3 (three) times daily. 05/30/23   Almon Hercules, MD  HYDROmorphone (DILAUDID) 8 MG tablet Take 1 tablet (8 mg total) by mouth every 3 (three) hours as needed for severe pain. 05/25/23   Regalado, Belkys A, MD  magnesium oxide (MAG-OX) 400 (240 Mg) MG tablet Take 1 tablet (400 mg total) by mouth daily. 06/12/22   Artis Delay, MD  metroNIDAZOLE (METROGEL) 0.75 % gel Apply topically daily. 05/26/23   Regalado, Belkys A, MD  Multiple Vitamins-Minerals (MULTIVITAMIN  WITH MINERALS) tablet Take 1 tablet by mouth daily.    [provider]  nutrition supplement, JUVEN, (JUVEN) PACK Take 1 packet by mouth 2 (two) times daily between meals. 05/30/23   Almon Hercules, MD  polyethylene glycol (MIRALAX / GLYCOLAX) 17 g packet Take 17 g by mouth 2 (two) times daily. 05/25/23   Regalado, Belkys A, MD  senna (SENOKOT) 8.6 MG tablet Take 1 tablet by mouth as needed for constipation.    [provider]      Allergies    Patient has no known allergies.    Review of Systems   Review of Systems  All other systems reviewed and are negative.   Physical Exam Updated Vital Signs BP 112/65 (BP Location: Left Arm)   Pulse (!) 120   Temp 98.7 F (37.1 C) (Rectal)   SpO2 90%  Physical Exam Vitals and nursing note reviewed.  Constitutional:      General: She is not in acute distress.    Appearance: She is cachectic. She is not toxic-appearing.  HENT:     Head: Normocephalic and atraumatic.  Eyes:     General: Lids are normal.     Conjunctiva/sclera: Conjunctivae normal.     Pupils: Pupils are equal, round, and reactive to light.  Neck:     Thyroid: No thyroid mass.     Trachea: No tracheal deviation.  Cardiovascular:  Rate and Rhythm: Regular rhythm. Tachycardia present.     Heart sounds: Normal heart sounds. No murmur heard.    No gallop.  Pulmonary:     Effort: Pulmonary effort is normal. No respiratory distress.     Breath sounds: Normal breath sounds. No stridor. No decreased breath sounds, wheezing, rhonchi or rales.  Chest:    Abdominal:     General: There is no distension.     Palpations: Abdomen is soft.     Tenderness: There is no abdominal tenderness. There is no rebound.  Musculoskeletal:        General: No tenderness. Normal range of motion.     Cervical back: Normal range of motion and neck supple.  Skin:    General: Skin is warm and dry.     Findings: No abrasion or rash.  Neurological:     Mental Status: She is  alert and oriented to person, place, and time. Mental status is at baseline.     GCS: GCS eye subscore is 4. GCS verbal subscore is 5. GCS motor subscore is 6.     Cranial Nerves: Cranial nerves are intact. No cranial nerve deficit.     Sensory: No sensory deficit.     Motor: Motor function is intact.  Psychiatric:        Attention and Perception: Attention normal.        Speech: Speech normal.        Behavior: Behavior normal.     ED Results / Procedures / Treatments   Labs (all labs ordered are listed, but only abnormal results are displayed) Labs Reviewed  RESP PANEL BY RT-PCR (RSV, FLU A&B, COVID)  [id number]  CULTURE, BLOOD (ROUTINE X 2)  CULTURE, BLOOD (ROUTINE X 2)  COMPREHENSIVE METABOLIC PANEL  CBC WITH DIFFERENTIAL/PLATELET  PROTIME-INR  APTT  URINALYSIS, W/ REFLEX TO CULTURE (INFECTION SUSPECTED)  I-STAT CG4 LACTIC ACID, ED    EKG EKG Interpretation Date/Time:  Sunday June 21 2023 12:38:37 EDT Ventricular Rate:  115 PR Interval:  141 QRS Duration:  76 QT Interval:  304 QTC Calculation: 421 R Axis:   15  Text Interpretation: Sinus tachycardia Low voltage, precordial leads Confirmed by Lorre Nick ([id number]) on 06/23/2023 1:00:20 PM  Radiology No results found.  Procedures Procedures    Medications Ordered in ED Medications  lactated ringers infusion (has no administration in time range)  lactated ringers bolus 1,000 mL (has no administration in time range)    And  lactated ringers bolus 500 mL (has no administration in time range)    And  lactated ringers bolus 250 mL (has no administration in time range)  vancomycin (VANCOCIN) IVPB 1000 mg/200 mL premix (has no administration in time range)  cefTRIAXone (ROCEPHIN) 2 g in sodium chloride 0.9 % 100 mL IVPB (has no administration in time range)  fentaNYL (SUBLIMAZE) injection 100 mcg (has no administration in time range)    ED Course/ Medical Decision Making/ A&P                                  Medical Decision Making Amount and/or Complexity of Data Reviewed Labs: ordered. Radiology: ordered. ECG/medicine tests: ordered.  Risk Prescription drug management. Decision regarding hospitalization.   Patient is EKG shows sinus tachycardia.  Patient did have hypotension initially that has resolved.  Given pain medication here.  Sepsis protocol started.  Her lactic chemic elevated 3.2.  Chest x-ray per  interpretation shows infiltrate.  Unsure if this is her source versus her chronic wound which could be infected that she has on her chest and back.  Patient is a DNR.  Discussed with hospitalist will admit the patient  CRITICAL CARE Performed by: Toy Baker Total critical care time: 55 minutes Critical care time was exclusive of separately billable procedures and treating other patients. Critical care was necessary to treat or prevent imminent or life-threatening deterioration. Critical care was time spent personally by me on the following activities: development of treatment plan with patient and/or surrogate as well as nursing, discussions with consultants, evaluation of patient's response to treatment, examination of patient, obtaining history from patient or surrogate, ordering and performing treatments and interventions, ordering and review of laboratory studies, ordering and review of radiographic studies, pulse oximetry and re-evaluation of patient's condition.         Final Clinical Impression(s) / ED Diagnoses Final diagnoses:  None    Rx / DC Orders ED Discharge Orders     None         Lorre Nick, MD 07/07/2023 1435

## 2023-06-22 ENCOUNTER — Other Ambulatory Visit (HOSPITAL_COMMUNITY): Payer: Self-pay

## 2023-06-22 DIAGNOSIS — A419 Sepsis, unspecified organism: Secondary | ICD-10-CM | POA: Diagnosis not present

## 2023-06-22 LAB — BASIC METABOLIC PANEL
Anion gap: 9 (ref 5–15)
BUN: 36 mg/dL — ABNORMAL HIGH (ref 8–23)
CO2: 26 mmol/L (ref 22–32)
Calcium: 11.9 mg/dL — ABNORMAL HIGH (ref 8.9–10.3)
Chloride: 104 mmol/L (ref 98–111)
Creatinine, Ser: 0.68 mg/dL (ref 0.44–1.00)
GFR, Estimated: >60 mL/min (ref >60)
Glucose, Bld: 79 mg/dL (ref 70–99)
Potassium: 5.6 mmol/L — ABNORMAL HIGH (ref 3.5–5.1)
Sodium: 139 mmol/L (ref 135–145)

## 2023-06-22 LAB — COMPREHENSIVE METABOLIC PANEL
ALT: 10 U/L (ref 0–44)
AST: 53 U/L — ABNORMAL HIGH (ref 15–41)
Albumin: 2.1 g/dL — ABNORMAL LOW (ref 3.5–5.0)
Alkaline Phosphatase: 84 U/L (ref 38–126)
Anion gap: 9 (ref 5–15)
BUN: 36 mg/dL — ABNORMAL HIGH (ref 8–23)
CO2: 25 mmol/L (ref 22–32)
Calcium: 12.4 mg/dL — ABNORMAL HIGH (ref 8.9–10.3)
Chloride: 100 mmol/L (ref 98–111)
Creatinine, Ser: 0.73 mg/dL (ref 0.44–1.00)
GFR, Estimated: >60 mL/min (ref >60)
Glucose, Bld: 78 mg/dL (ref 70–99)
Potassium: 4.7 mmol/L (ref 3.5–5.1)
Sodium: 134 mmol/L — ABNORMAL LOW (ref 135–145)
Total Bilirubin: 0.7 mg/dL (ref 0.3–1.2)
Total Protein: 6.3 g/dL — ABNORMAL LOW (ref 6.5–8.1)

## 2023-06-22 LAB — CBC WITH DIFFERENTIAL/PLATELET
Abs Immature Granulocytes: 0.03 10*3/uL (ref 0.00–0.07)
Basophils Absolute: 0.1 10*3/uL (ref 0.0–0.1)
Basophils Relative: 1 %
Eosinophils Absolute: 0 10*3/uL (ref 0.0–0.5)
Eosinophils Relative: 0 %
HCT: 40.4 % (ref 36.0–46.0)
Hemoglobin: 12 g/dL (ref 12.0–15.0)
Immature Granulocytes: 0 %
Lymphocytes Relative: 17 %
Lymphs Abs: 1.4 10*3/uL (ref 0.7–4.0)
MCH: 26.9 pg (ref 26.0–34.0)
MCHC: 29.7 g/dL — ABNORMAL LOW (ref 30.0–36.0)
MCV: 90.6 fL (ref 80.0–100.0)
Monocytes Absolute: 1 10*3/uL (ref 0.1–1.0)
Monocytes Relative: 12 %
Neutro Abs: 5.7 10*3/uL (ref 1.7–7.7)
Neutrophils Relative %: 70 %
Platelets: 381 10*3/uL (ref 150–400)
RBC: 4.46 MIL/uL (ref 3.87–5.11)
RDW: 16.8 % — ABNORMAL HIGH (ref 11.5–15.5)
WBC: 8.1 10*3/uL (ref 4.0–10.5)
nRBC: 0 % (ref 0.0–0.2)

## 2023-06-22 MED ORDER — MORPHINE SULFATE ER 30 MG PO TBCR
30.0000 mg | EXTENDED_RELEASE_TABLET | Freq: Three times a day (TID) | ORAL | Status: DC
  Administered 2023-06-22 – 2023-06-23 (×4): 30 mg via ORAL
  Filled 2023-06-22 (×4): qty 1

## 2023-06-22 MED ORDER — ORAL CARE MOUTH RINSE
15.0000 mL | OROMUCOSAL | Status: DC
  Administered 2023-06-22 – 2023-06-25 (×13): 15 mL via OROMUCOSAL

## 2023-06-22 MED ORDER — POLYETHYLENE GLYCOL 3350 17 G PO PACK
17.0000 g | PACK | Freq: Once | ORAL | Status: AC
  Administered 2023-06-22: 17 g via ORAL
  Filled 2023-06-22: qty 1

## 2023-06-22 MED ORDER — ORAL CARE MOUTH RINSE
15.0000 mL | OROMUCOSAL | Status: DC | PRN
  Administered 2023-06-24: 15 mL via OROMUCOSAL

## 2023-06-22 MED ORDER — HYDROMORPHONE HCL 1 MG/ML IJ SOLN
1.0000 mg | INTRAMUSCULAR | Status: DC | PRN
  Administered 2023-06-22: 1 mg via INTRAVENOUS
  Filled 2023-06-22: qty 1

## 2023-06-22 MED ORDER — DOCUSATE SODIUM 100 MG PO CAPS
100.0000 mg | ORAL_CAPSULE | Freq: Once | ORAL | Status: DC
  Filled 2023-06-22: qty 1

## 2023-06-22 MED ORDER — HYDROMORPHONE HCL 1 MG/ML IJ SOLN
2.0000 mg | INTRAMUSCULAR | Status: DC | PRN
  Administered 2023-06-22 – 2023-06-23 (×2): 2 mg via INTRAVENOUS
  Filled 2023-06-22 (×2): qty 2

## 2023-06-22 MED ORDER — SODIUM CHLORIDE 0.9 % IV SOLN: INTRAVENOUS | Status: DC

## 2023-06-22 MED ORDER — SODIUM CHLORIDE 0.9 % IV BOLUS
500.0000 mL | Freq: Once | INTRAVENOUS | Status: AC
  Administered 2023-06-22: 500 mL via INTRAVENOUS

## 2023-06-22 NOTE — Progress Notes (Signed)
[name]   DOB:[birth year]   ZO#:[id number]    ASSESSMENT & PLAN:  Metastatic breast cancer She has significant decline in performance status The patient is not a candidate for chemotherapy I recommend transitioning her care to comfort care and hospice  Malignant hypercalcemia Due to poor hydration as well as metastatic disease Continue IV fluids and she has received Zometa  Delayed wound healing Unfortunately, I do not anticipate her wound is going to heal with her ongoing poor oral intake and severe malnutrition She will continue antibiotics and wound care  Cancer associated pain Appreciate palliative care consult If the patient is in agreement for hospice, recommend transition her to continuous IV pain medicine  PE Due to poor oral initake, active cancer and dehydration Continue lovenox  Severe malnutrition, malignant cachexia As above, the patient is actively dying She can eat what ever she wants but at this point, I do not think her intake is meaningful  Goals of care discussion I had extensive discussions about goals of care in the past Today, I discussed this with the patient, her mother who is by the bedside and her sister over the phone I explained to them again why chemotherapy is not indicated With her poor performance status, malnutrition, severe hypercalcemia and active wound infection, I do not foresee that she will improve to the point that she can resume chemotherapy again I recommend transitioning care to comfort measures and hospice but the patient and family are not ready I will resume goals of care discussion again tomorrow The patient and family wants to continue everything as of right now  All questions were answered. The patient knows to call the clinic with any problems, questions or concerns.   The total time spent in the appointment was 55 minutes encounter with patients including review of chart and various tests results, discussions about plan of  care and coordination of care plan  Artis Delay, MD 06/22/2023 3:08 PM  Subjective:  Patient is well-known to me.  She was discharged from the hospital on May 30, 2023.  She is scheduled to see me this Friday for evaluation but was admitted to the hospital since yesterday due to failure to thrive, possible sepsis, malignant hypercalcemia and severe malnutrition She was seen in the room around 1:00.  Her mother is not by the bedside.  She is in a lot of pain.  She looks profoundly cachectic.  I have reviewed her chart, review her labs as well as CT imaging.  I returned to the bedside again around 330pm to resume goals of care discussion in the presence of her mother  Objective:  Vitals:   06/22/23 0909 06/22/23 1343  BP: 104/62 97/64  Pulse: (!) 128 (!) 124  Resp: 16 16  Temp: 98.2 F (36.8 C) 98.2 F (36.8 C)  SpO2: 99% 95%     Intake/Output Summary (Last 24 hours) at 06/22/2023 1508 Last data filed at 06/22/2023 0700 Gross per 24 hour  Intake 1478.18 ml  Output 200 ml  Net 1278.18 ml    GENERAL:alert, in distress from pain.  She looks incredibly cachectic compared to my prior evaluation 3 weeks ago.   Labs:  Recent Labs    05/19/23 1810 05/20/23 1012 05/23/23 0436 06/12/2023 1216 06/22/23 0450  NA 135   < > 138 137 134*  K 3.7   < > 3.8 4.9 4.7  CL 99   < > 110 104 100  CO2 24   < > 21* 26 25  GLUCOSE 141*   < > 89 102* 78  BUN 20   < > 13 45* 36*  CREATININE 0.97   < > 0.70 0.82 0.73  CALCIUM 8.3*   < > 7.0* 12.9* 12.4*  GFRNONAA >60   < > >60 >60 >60  PROT 6.6  --   --  6.6 6.3*  ALBUMIN 2.7*  --   --  2.2* 2.1*  AST 43*  --   --  56* 53*  ALT 12  --   --  9 10  ALKPHOS 73  --   --  88 84  BILITOT 0.6  --   --  0.7 0.7   < > = values in this interval not displayed.    Studies: I have personally reviewed her imaging study CT Angio Chest Pulmonary Embolism (PE) W or WO Contrast  Result Date: Jul 07, 2023 CLINICAL DATA:  History of breast cancer with chronic  chest wound and one-week history of increasing weakness. Tachycardic. EXAM: CT ANGIOGRAPHY CHEST WITH CONTRAST TECHNIQUE: Multidetector CT imaging of the chest was performed using the standard protocol during bolus administration of intravenous contrast. Multiplanar CT image reconstructions and MIPs were obtained to evaluate the vascular anatomy. RADIATION DOSE REDUCTION: This exam was performed according to the departmental dose-optimization program which includes automated exposure control, adjustment of the mA and/or kV according to patient size and/or use of iterative reconstruction technique. CONTRAST:  75mL OMNIPAQUE IOHEXOL 350 MG/ML SOLN COMPARISON:  CTA chest dated 01/09/2023 FINDINGS: Cardiovascular: The study is high quality for the evaluation of pulmonary embolism. Multifocal bilateral pulmonary emboli extending from distal lobar arteries into all lobes. Great vessels are normal in course and caliber. RV: LV ratio is greater than 1. No significant pericardial fluid/thickening. Mediastinum/Nodes: Imaged thyroid gland without nodules meeting criteria for imaging follow-up by size. Normal esophagus. Right hilar lymph node measures 1.0 cm (4:90), previously 0.7 cm. Lungs/Pleura: The central airways are patent. Again seen is volume loss with architectural distortion and scarring in the inferior right lower lung with subsegmental atelectasis in the right lower lobe. Irregular right apical nodule measures 10 x 4 mm (12:37), not substantially changed when remeasured. Unchanged 4 mm medial right lower lobe nodule (12:80). Additional scattered nodules, including left apical irregular nodule measuring 5 x 3 mm (12:38), are also unchanged. New ground-glass opacity in the medial left apex (12:44) and peribronchovascular left lower lobe. No pneumothorax. Unchanged right pleural effusion with a loculated component. Upper abdomen: Normal. Musculoskeletal: Similar sclerotic appearance of the sternum and right anterior  eighth rib. Similar T3 compression deformity. Increased diffuse body wall edema. Continued progression of cutaneous thickening and soft tissue lesions involving the right anterior chest wall extending into the right upper anterior abdominal wall. Previously measured lesion along the medial upper right chest wall measures 3.7 x 1.5 cm (4:66), previously 3.1 x 2.5 cm. Review of the MIP images confirms the above findings. IMPRESSION: 1. Multifocal bilateral pulmonary emboli extending from distal lobar arteries into all lobes. Evidence of right heart strain. 2. New ground-glass opacity in the medial left apex and peribronchovascular left lower lobe, which may be infectious/inflammatory or secondary to pulmonary infarct. 3. Unchanged right pleural effusion with a loculated component. 4. Continued progression of cutaneous thickening and soft tissue lesions involving the right anterior chest wall extending into the right upper anterior abdominal wall. No drainable fluid collection. No gaseous component. 5. Right hilar lymphadenopathy, indeterminate. 6. Unchanged sclerotic appearance of the sternum and right anterior eighth rib, suspicious for  osseous metastatic disease. 7. Increased diffuse body wall edema. Critical Value/emergent results were called by telephone at the time of interpretation on 07-09-2023 at 3:35 pm to provider DAVID ORTIZ , who verbally acknowledged these results. Electronically Signed   By: Agustin Cree M.D.   On: 07/09/23 15:35   DG Chest Port 1 View  Result Date: 07-09-23 CLINICAL DATA:  Sepsis.  Metastatic breast carcinoma EXAM: PORTABLE CHEST 1 VIEW COMPARISON:  05/19/2023 FINDINGS: Stable mild cardiomegaly. Small layering right pleural effusion shows no significant change. Infiltrate or atelectasis in the right lower lung also unchanged. IMPRESSION: Stable small layering right pleural effusion and right lower lung infiltrate or atelectasis. Electronically Signed   By: Danae Orleans M.D.   On:  07-09-23 13:32   VAS [place] LOWER EXTREMITY VENOUS (DVT)  Result Date: 05/29/2023  Lower Venous DVT Study Patient Name:  VALINA HOEPPNER  Date of Exam:   05/28/2023 Medical Rec #: [id number]        Accession #:    [id number] Date of Birth: [birth year]         Patient Gender: F Patient Age:   43 years Exam Location:  [facility] [place] Procedure:      VAS [place] LOWER EXTREMITY VENOUS (DVT) Referring Phys: Jon Billings REGALADO --------------------------------------------------------------------------------  Indications: Swelling.  Risk Factors: Cancer. Limitations: Poor ultrasound/tissue interface. Comparison Study: 05/20/2023 - RIGHT:                   - There is no evidence of deep vein thrombosis in the lower                   extremity.                    - No cystic structure found in the popliteal fossa.                    LEFT:                   - There is no evidence of deep vein thrombosis in the lower                   extremity.                    - No cystic structure found in the popliteal fossa. Performing Technologist: Chanda Busing RVT  Examination Guidelines: A complete evaluation includes B-mode imaging, spectral Doppler, color Doppler, and power Doppler as needed of all accessible portions of each vessel. Bilateral testing is considered an integral part of a complete examination. Limited examinations for reoccurring indications may be performed as noted. The reflux portion of the exam is performed with the patient in reverse Trendelenburg.  +---------+---------------+---------+-----------+----------+--------------+ RIGHT    CompressibilityPhasicitySpontaneityPropertiesThrombus Aging +---------+---------------+---------+-----------+----------+--------------+ CFV      Full           Yes      Yes                                 +---------+---------------+---------+-----------+----------+--------------+ SFJ      Full                                                         +---------+---------------+---------+-----------+----------+--------------+  FV Prox  Full                                                        +---------+---------------+---------+-----------+----------+--------------+ FV Mid   Full                                                        +---------+---------------+---------+-----------+----------+--------------+ FV DistalFull                                                        +---------+---------------+---------+-----------+----------+--------------+ PFV      Full                                                        +---------+---------------+---------+-----------+----------+--------------+ POP      Full           Yes      Yes                                 +---------+---------------+---------+-----------+----------+--------------+ PTV      Full                                                        +---------+---------------+---------+-----------+----------+--------------+ PERO     Full                                                        +---------+---------------+---------+-----------+----------+--------------+   +----+---------------+---------+-----------+----------+--------------+ LEFTCompressibilityPhasicitySpontaneityPropertiesThrombus Aging +----+---------------+---------+-----------+----------+--------------+ CFV Full           Yes      Yes                                 +----+---------------+---------+-----------+----------+--------------+     Summary: RIGHT: - There is no evidence of deep vein thrombosis in the lower extremity.  - No cystic structure found in the popliteal fossa.  LEFT: - No evidence of common femoral vein obstruction.   *See table(s) above for measurements and observations. Electronically signed by Gerarda Fraction on 05/29/2023 at 9:22:17 AM.    Final

## 2023-06-22 NOTE — Progress Notes (Signed)
PHARMACY NOTE -  Enoxaparin  Pharmacy has been assisting with dosing of enoxaparin for pulmonary emboli. Dosage remains stable at 1 mg/kg SQ bid  Pharmacy will sign off, following peripherally for dose adjustments and length of therapy. Please reconsult if a change in clinical status warrants re-evaluation of dosage.  Bernadene Person, PharmD, BCPS [phone] 06/22/2023, 8:23 AM

## 2023-06-22 NOTE — Consult Note (Signed)
Consultation Note Date: 06/22/2023   Patient Name: [name]  DOB: 19-May-1961  MRN: [id number]  Age / Sex: 62 y.o., female  PCP: Artis Delay, MD Referring Physician: Rolly Salter, MD  Reason for Consultation: Establishing goals of care  HPI/Patient Profile: 62 y.o. female admitted on 07/02/23    Clinical Assessment and Goals of Care: 62 year old lady, known to palliative service from previous recent hospitalization, life limiting illness of stage IV breast cancer metastatic to bone, anemia acquired pancytopenia underlying history of GERD hypertension constipation malignant pleural effusion severe protein calorie malnutrition. Patient follows with Dr. Bertis Ruddy from medical oncology.  She was also to be connected with Ms. Wynonia Sours, NP palliative colleague at [facility]. Patient readmitted to hospital medicine service for carcinomatosis cellulitis of chest wall, history of hypercalcemia of malignancy, history of bilateral pulmonary embolism.  Patient has breast cancer metastasized to the bone.  She also has thrombocytosis in the setting of cancer. Known to palliative service.  Medication history reviewed.  Chart reviewed.  Patient seen and examined.  Patient recognizes this Clinical research associate from previous hospitalization. Palliative medicine is specialized medical care for people living with serious illness. It focuses on providing relief from the symptoms and stress of a serious illness. The goal is to improve quality of life for both the patient and the family. Goals of care: Broad aims of medical therapy in relation to the patient's values and preferences. Our aim is to provide medical care aimed at enabling patients to achieve the goals that matter most to them, given the circumstances of their particular medical situation and their constraints.    NEXT OF KIN  Mother and sister.   SUMMARY OF  RECOMMENDATIONS   1.  Agree with DNR 2.  Resume home medication of MS Contin 30 mg by mouth every 8 hours.  Continue with IV Dilaudid-adjust the dose slightly.  Continue to monitor pain needs.  Patient does not wish to resume oral hydromorphone at this time.  Adjuvant therapy is also noted on the chart.  Also on topical metronidazole for chest wall wounds. 3.  Goals of care discussions: Patient was to have hospice consultation at her facility.  Today, at the time of this initial consultation, patient is with uncontrolled pain and generalized distress and wishes to discuss at a later time once adequate pain control has been obtained.  PMT will continue to follow. Thank you for the consult.  Code Status/Advance Care Planning: DNR   Symptom Management:     Palliative Prophylaxis:  Delirium Protocol    Psycho-social/Spiritual:  Desire for further Chaplaincy support:yes Additional Recommendations: Education on Hospice  Prognosis:  < 4 weeks  Discharge Planning: To Be Determined      Primary Diagnoses: Present on Admission:  Sepsis due to undetermined organism [facility])  Breast cancer metastasized to bone, right (HCC)  Essential hypertension  GERD (gastroesophageal reflux disease)  Other constipation  Protein-calorie malnutrition, severe  Thrombocytosis  Hypercalcemia of malignancy  Bilateral pulmonary embolism (HCC)  Pressure injury of skin  Carcinomatosis (HCC)  Cellulitis of chest wall   I have reviewed the medical record, interviewed the patient and family, and examined the patient. The following aspects are pertinent.  Past Medical History:  Diagnosis Date   Breast cancer, stage 4, right (HCC) 03/22/2022   Empyema, right (HCC) 08/11/2022   Hypertension    rt breast ca 02/2022   Social History   Socioeconomic History   Marital status: Single    Spouse name: Not on file   Number of children: 0   Years of education: Not on file   Highest education level: Not on  file  Occupational History   Occupation: retired Runner, broadcasting/film/video  Tobacco Use   Smoking status: Never   Smokeless tobacco: Never  Substance and Sexual Activity   Alcohol use: No   Drug use: No   Sexual activity: Not on file  Other Topics Concern   Not on file  Social History Narrative   Not on file   Social Determinants of Health   Financial Resource Strain: Low Risk  (04/03/2022)   Overall Financial Resource Strain (CARDIA)    Difficulty of Paying Living Expenses: Not hard at all  Food Insecurity: No Food Insecurity (06/17/2023)   Hunger Vital Sign    Worried About Running Out of Food in the Last Year: Never true    Ran Out of Food in the Last Year: Never true  Transportation Needs: No Transportation Needs (06/15/2023)   PRAPARE - Administrator, Civil Service (Medical): No    Lack of Transportation (Non-Medical): No  Physical Activity: Not on file  Stress: Not on file  Social Connections: Not on file   Family History  Problem Relation Age of Onset   Hypertension Mother    Heart disease Father    Stroke Father    Kidney disease Father    Hypertension Sister    Hypertension Maternal Grandmother    Stroke Maternal Grandfather    Diabetes Paternal Grandmother    Cancer Cousin    Scheduled Meds:  baclofen  5 mg Oral Q12H   cyproheptadine  4 mg Oral QHS   enoxaparin (LOVENOX) injection  70 mg Subcutaneous Q12H   feeding supplement  237 mL Oral BID BM   metroNIDAZOLE   Topical BID   mirtazapine  7.5 mg Oral QHS   morphine  30 mg Oral Q8H   mouth rinse  15 mL Mouth Rinse 4 times per day   pantoprazole  40 mg Oral Daily   Continuous Infusions:  sodium chloride 100 mL/hr at 06/22/23 1222   cefTRIAXone (ROCEPHIN)  IV 2 g (06/22/23 1225)   vancomycin     PRN Meds:.acetaminophen **OR** acetaminophen, HYDROmorphone (DILAUDID) injection, ondansetron **OR** ondansetron (ZOFRAN) IV, mouth rinse Medications Prior to Admission:  Prior to Admission medications   Medication  Sig Start Date End Date Taking? Authorizing Provider  acetaminophen (TYLENOL) 500 MG tablet Take 1 tablet (500 mg total) by mouth every 6 (six) hours. 05/25/23  Yes Regalado, Belkys A, MD  Baclofen 5 MG TABS Take 5 mg by mouth every 12 (twelve) hours.   Yes [provider]  feeding supplement (ENSURE ENLIVE / ENSURE PLUS) LIQD Take 237 mLs by mouth 2 (two) times daily between meals. 05/30/23  Yes Almon Hercules, MD  HYDROmorphone (DILAUDID) 8 MG tablet Take 1 tablet (8 mg total) by mouth every 3 (three) hours as needed for severe pain. Patient taking differently: Take 8 mg by mouth daily as needed for severe  pain. 05/25/23  Yes Regalado, Belkys A, MD  metroNIDAZOLE (METROGEL) 0.75 % gel Apply topically daily. 05/26/23  Yes Regalado, Belkys A, MD  nutrition supplement, JUVEN, (JUVEN) PACK Take 1 packet by mouth 2 (two) times daily between meals. 05/30/23  Yes Almon Hercules, MD  traZODone (DESYREL) 50 MG tablet Take 25 mg by mouth at bedtime.   Yes [provider]  calcium carbonate (TUMS - DOSED IN MG ELEMENTAL CALCIUM) 500 MG chewable tablet Chew 500 mg by mouth 2 (two) times daily. Patient not taking: Reported on 06-28-23    [provider]  cholecalciferol (VITAMIN D3) 25 MCG (1000 UNIT) tablet Take 1,000 Units by mouth daily. Patient not taking: Reported on 2023-06-28    [provider]  gabapentin (NEURONTIN) 300 MG capsule Take 1 capsule (300 mg total) by mouth 3 (three) times daily. Patient not taking: Reported on 2023-06-28 05/30/23   Almon Hercules, MD  magnesium oxide (MAG-OX) 400 (240 Mg) MG tablet Take 1 tablet (400 mg total) by mouth daily. Patient not taking: Reported on Jun 28, 2023 06/12/22   Artis Delay, MD  Multiple Vitamins-Minerals (MULTIVITAMIN WITH MINERALS) tablet Take 1 tablet by mouth daily. Patient not taking: Reported on 28-Jun-2023    [provider]  polyethylene glycol (MIRALAX / GLYCOLAX) 17 g packet Take 17 g by mouth 2 (two) times  daily. Patient not taking: Reported on 2023/06/28 05/25/23   Hartley Barefoot A, MD  senna (SENOKOT) 8.6 MG tablet Take 1 tablet by mouth as needed for constipation. Patient not taking: Reported on 06/28/2023    [provider]   No Known Allergies Review of Systems +weakness + chest wall pain from wounds  Physical Exam Appears weak and with generalized discomfort evident Ill-appearing Shallow regular breath sounds Awake alert Extensive muscle wasting, cancer-related cachexia evident Chest wall with dressing noted to have extensive cutaneous carcinomatosis likely with superimposed ulcerations and cellulitis pictures noted on the chart Has edema  Vital Signs: BP 104/62 (BP Location: Left Arm)   Pulse (!) 128   Temp 98.2 F (36.8 C) (Oral)   Resp 16   Wt 64.2 kg   SpO2 99%   BMI 22.84 kg/m  Pain Scale: 0-10   Pain Score: 10-Worst pain ever   SpO2: SpO2: 99 % O2 Device:SpO2: 99 % O2 Flow Rate: .O2 Flow Rate (L/min): 1 L/min  IO: Intake/output summary:  Intake/Output Summary (Last 24 hours) at 06/22/2023 1245 Last data filed at 06/22/2023 0700 Gross per 24 hour  Intake 1478.18 ml  Output 200 ml  Net 1278.18 ml    LBM: Last BM Date : June 28, 2023 Baseline Weight: Weight: 64.2 kg Most recent weight: Weight: 64.2 kg     Palliative Assessment/Data:   PPS 40%  Time In:    11.40 Time Out:  12.40 Time Total:  60  Greater than 50%  of this time was spent counseling and coordinating care related to the above assessment and plan.  Signed by: Rosalin Hawking, MD   Please contact Palliative Medicine Team phone at [phone] for questions and concerns.  For individual provider: See Loretha Stapler

## 2023-06-22 NOTE — Plan of Care (Signed)
  Problem: Education: Goal: Knowledge of General Education information will improve Description: Including pain rating scale, medication(s)/side effects and non-pharmacologic comfort measures Outcome: Progressing   Problem: Nutrition: Goal: Adequate nutrition will be maintained Outcome: Progressing   Problem: Coping: Goal: Level of anxiety will decrease Outcome: Progressing   Problem: Elimination: Goal: Will not experience complications related to bowel motility Outcome: Progressing   Problem: Safety: Goal: Ability to remain free from injury will improve Outcome: Progressing

## 2023-06-22 NOTE — Progress Notes (Signed)
[name] [facility] [address]  Hospice hospital liaison note  Patient previously referred for hospice services in facility however was admitted to the hospital before this could be initiated.   Liaison will remain available for any assistance we can provide.   Thank you,  Thea Gist, BSN [facility] liaison  [phone]

## 2023-06-22 NOTE — TOC Benefit Eligibility Note (Signed)
Patient Product/process development scientist completed.    The patient is insured through [facility]. Patient has [employer], may use a copay card, and/or apply for patient assistance if available.    Ran test claim for Eliquis 5 mg and the current 30 day co-pay is $0.00.  Ran test claim for Xarelto 20 mg and the current 30 day co-pay is $0.00.  This test claim was processed through [facility]- copay amounts may vary at other pharmacies due to pharmacy/plan contracts, or as the patient moves through the different stages of their insurance plan.     Roland Earl, CPHT Pharmacy Technician III Certified Patient Advocate [facility] Pharmacy Patient Advocate Team Direct Number: [phone]  Fax: [phone]

## 2023-06-22 NOTE — Progress Notes (Signed)
   06/22/23 0452  Assess: MEWS Score  Temp 98.9 F (37.2 C)  BP 115/65  MAP (mmHg) 80  Pulse Rate (!) 124  Resp 18  SpO2 98 %  O2 Device Nasal Cannula  Assess: MEWS Score  MEWS Temp 0  MEWS Systolic 0  MEWS Pulse 2  MEWS RR 0  MEWS LOC 0  MEWS Score 2  MEWS Score Color Yellow  Assess: if the MEWS score is Yellow or Red  Were vital signs accurate and taken at a resting state? Yes  Does the patient meet 2 or more of the SIRS criteria? No  MEWS guidelines implemented  Yes, yellow  Treat  MEWS Interventions Considered administering scheduled or prn medications/treatments as ordered  Take Vital Signs  Increase Vital Sign Frequency  Yellow: Q2hr x1, continue Q4hrs until patient remains green for 12hrs  Escalate  MEWS: Escalate Yellow: Discuss with charge nurse and consider notifying provider and/or RRT  Notify: Charge Nurse/RN  Name of Charge Nurse/RN Notified Kristine, RN  Provider Notification  Provider Name/Title Chinita Greenland, NP  Date Provider Notified 06/22/23  Time Provider Notified 0507  Method of Notification Page (secure chat)  Notification Reason Other (Comment) (increased heart rate)  Provider response No new orders  Date of Provider Response 06/22/23  Time of Provider Response 0509  Assess: SIRS CRITERIA  SIRS Temperature  0  SIRS Pulse 1  SIRS Respirations  0  SIRS WBC 0  SIRS Score Sum  1

## 2023-06-22 NOTE — Plan of Care (Signed)
Problem: Clinical Measurements: Goal: Cardiovascular complication will be avoided Outcome: Progressing   Problem: Activity: Goal: Risk for activity intolerance will decrease Outcome: Progressing   Problem: Elimination: Goal: Will not experience complications related to urinary retention Outcome: Progressing   Problem: Pain Managment: Goal: General experience of comfort will improve Outcome: Progressing   Problem: Safety: Goal: Ability to remain free from injury will improve Outcome: Progressing

## 2023-06-22 NOTE — Progress Notes (Signed)
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 Found to have a hyprkalemia, hypercalcemia, suspected sepsis in the setting of cellulitis. Oncology following.  Palliative care following.  Appreciate assistance.  Assessment and Plan: Concern for sepsis most likely secondary to cellulitis. Met SIRS.  At the time of admission with tachycardia and tachypnea. Although suspect this presentation is mostly secondary to PE rather than SIRS. Currently receiving IV antibiotic but will have low threshold to discontinue them.  Acute pulmonary evaluation. In the setting of malignancy. On Lovenox. Currently on room air 93%.  Hypercalcemia of malignancy. Currently treated with IV hydration. Prognosis very poor.  Breast cancer with extensive metastasis Uncontrolled pain.  Chronic related to cancer. Palliative care following. Highly appreciate palliative care as well as oncology consultation. Patient's prognosis is very poor. Transition to complete comfort is recommended to family. Mother still not ready.  Constipation. Continue current regimen.  Severe protein calorie malnutrition. Adding to poor prognosis. Monitor.  Stage III medial coccyx ulcer Present on admission. Foam dressing.   Subjective: No nausea no vomiting no fever no chills.  Pain uncontrolled 10 out of 10 despite receiving IV Dilaudid.  Physical Exam:  General: in severe distress,, diffuse carcinomatosis with mild redness without any drainage.   Cardiovascular: S1 and S2 Present, No Murmur Respiratory: Good respiratory effort, Bilateral Air entry present. No Crackles, No wheezes Abdomen: Bowel  Sound present, No tenderness Extremities: No edema Neuro: Alert and oriented x3, no new focal deficit  Data Reviewed: I have Reviewed nursing notes, Vitals, and Lab results. Since last encounter, pertinent lab results CBC and BMP   . I have ordered test including CBC and BMP  . I have discussed pt's care plan and test results with oncology  .   Disposition: Status is: Inpatient Remains inpatient appropriate because: Need further workup and pain control   Family Communication: Mother at bedside. Level of care: Progressive   Vitals:   06/22/23 0452 06/22/23 0909 06/22/23 1343 06/22/23 1836  BP: 115/65 104/62 97/64 106/61  Pulse: (!) 124 (!) 128 (!) 124 (!) 123  Resp: 18 16 16  (!) 22  Temp: 98.9 F (37.2 C) 98.2 F (36.8 C) 98.2 F (36.8 C) 97.7 F (36.5 C)  TempSrc: Oral Oral Oral Oral  SpO2: 98% 99% 95% 97%  Weight:         Author: Lynden Oxford, MD 06/22/2023 8:11 PM  Please look on [email or web address] to find out who is on call.

## 2023-06-22 NOTE — Evaluation (Signed)
Clinical/Bedside Swallow Evaluation Patient Details  Name: [name] MRN: [id number] Date of Birth: 10/11/1960  Today's Date: 06/22/2023 Time: SLP Start Time (ACUTE ONLY): 1110 SLP Stop Time (ACUTE ONLY): 1126 SLP Time Calculation (min) (ACUTE ONLY): 16 min  Past Medical History:  Past Medical History:  Diagnosis Date   Breast cancer, stage 4, right (HCC) 03/22/2022   Empyema, right (HCC) 08/11/2022   Hypertension    rt breast ca 02/2022   Past Surgical History:  Past Surgical History:  Procedure Laterality Date   ANKLE SURGERY     IR THORACENTESIS ASP PLEURAL SPACE W/IMG GUIDE  04/10/2022   IR US GUIDE BX ASP/DRAIN  03/24/2022   HPI:  Patient is a 62 y.o female who presented from her SNF with failure to thrive, bilateral lower edema, and open chest wounds with purulent discharge. PMH is significant for GERD, hypertension, constipation, stage IV breast cancer, chemotherapy anemia, acquired pancytopenia, malignant pleural effusion, and severe protein calorie malnutrition.    Assessment / Plan / Recommendation  Clinical Impression  Patient was seen by SLP for bedside swallow evaluation per RN observation  of the patient have difficulty with swallowing a pill. the patient was awake and alert, but her participation was limited by her pain, described as an 11 on a 1-10 scale. SLP alerted RN of patient's pain and request for medication and reclined the bed to reduce pressure on wounds. The patient stated that she has had difficulty with pills in the past, sometimes taking them with applesauce. She shared no c/o of difficulty or pain associated with swallowing. No overt s/sx of aspiration or dysphagia noted during direct observation with ice chips and thin liquid (water). The patient was advised to take pills with applesauce as needed. No further ST f/u necessary at this time. Please reorder SLP eval/treat if further dysphagia complaints arise. SLP Visit Diagnosis: Dysphagia, unspecified  (R13.10)    Aspiration Risk  No limitations    Diet Recommendation Regular;Thin liquid    Liquid Administration via: Cup;Straw Medication Administration: Whole meds with liquid (With puree PRN) Supervision: Patient able to self feed Compensations: Minimize environmental distractions;Slow rate;Small sips/bites Postural Changes: Seated upright at 90 degrees    Other  Recommendations Oral Care Recommendations: Oral care BID    Recommendations for follow up therapy are one component of a multi-disciplinary discharge planning process, led by the attending physician.  Recommendations may be updated based on patient status, additional functional criteria and insurance authorization.  Follow up Recommendations No SLP follow up      Assistance Recommended at Discharge    Functional Status Assessment Patient has not had a recent decline in their functional status  Frequency and Duration            Prognosis        Swallow Study   General Date of Onset: 07/04/2023 HPI: Patient is a 62 y.o female who presented from her SNF with failure to thrive, bilteral lower edema, and opden chest wounds with purulent discharge. PMH is significant for GERD, hypertension, constipation, stage IV breast cancer, chemotherapy anemia, acquired pancytopenua, maliginant pleural effusion, and severe protien calorie malnutrion. Type of Study: Bedside Swallow Evaluation Diet Prior to this Study: Regular;Thin liquids (Level 0) Temperature Spikes Noted: No Respiratory Status: Nasal cannula History of Recent Intubation: No Behavior/Cognition: Alert;Cooperative Oral Cavity Assessment: Within Functional Limits Oral Care Completed by SLP: No Oral Cavity - Dentition: Adequate natural dentition;Poor condition Vision: Functional for self-feeding Self-Feeding Abilities: Able to feed self Patient Positioning:  Partially reclined Baseline Vocal Quality: Normal Volitional Swallow: Able to elicit    Oral/Motor/Sensory  Function Overall Oral Motor/Sensory Function: Within functional limits   Ice Chips Ice chips: Within functional limits Presentation: Cup;Self Fed   Thin Liquid Thin Liquid: Within functional limits Presentation: Self Fed;Straw    Nectar Thick     Honey Thick     Puree     Solid            Marline Backbone, B.S., Speech Therapy Student   06/22/2023,11:49 AM

## 2023-06-22 NOTE — Hospital Course (Signed)
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 Found to have a hyprkalemia, hypercalcemia, suspected sepsis in the setting of cellulitis. Oncology following.  Palliative care following.  Appreciate assistance. Now complete comfort care.  Assessment and Plan: Concern for sepsis most likely secondary to cellulitis. Met SIRS.  At the time of admission with tachycardia and tachypnea. Although suspect this presentation is mostly secondary to PE rather than SIRS.  Acute pulmonary evaluation. In the setting of malignancy. On Lovenox.  Hypercalcemia of malignancy. Currently treated with IV hydration. Prognosis very poor.  Breast cancer with extensive metastasis Uncontrolled pain.  Chronic related to cancer. Palliative care following. Highly appreciate palliative care as well as oncology consultation. Patient's prognosis is very poor.  Goals of care conversation. Highly appreciate oncology assistance as well as palliative care assistance. Prognosis is very poor.  Could be days to weeks. Pain is severely uncontrolled. Now on complete comfort.  Most likely anticipating hospital death.  Constipation. Continue current regimen.  Severe protein calorie malnutrition. Adding to poor prognosis. Monitor.  Stage III medial coccyx ulcer Present on admission. Foam dressing.

## 2023-06-22 NOTE — TOC Initial Note (Addendum)
Transition of Care [facility]) - Initial/Assessment Note    Patient Details  Name: [name] MRN: [id number] Date of Birth: 1961-08-13  [facility]) CM/SW Contact:    Howell Rucks, RN Phone Number: 06/22/2023, 11:24 AM  Clinical Narrative:   Met with pt at bedside to introduce role of TOC/NCM and review for dc planning. Pt confirmed she was admitted from [facility] short term rehab/SNF, not sure if she is going back. Per progress note from [facility] " Patient previously referred for hospice services in facility however was admitted to the hospital before this could be initiated"  TOC will continue to follow.                      Patient Goals and CMS Choice            Expected Discharge Plan and Services                                              Prior Living Arrangements/Services                       Activities of Daily Living Home Assistive Devices/Equipment: Dan Humphreys (specify type) ADL Screening (condition at time of admission) Patient's cognitive ability adequate to safely complete daily activities?: Yes Is the patient deaf or have difficulty hearing?: No Does the patient have difficulty seeing, even when wearing glasses/contacts?: No Does the patient have difficulty concentrating, remembering, or making decisions?: No Patient able to express need for assistance with ADLs?: Yes Does the patient have difficulty dressing or bathing?: Yes Independently performs ADLs?: No Communication: Independent Dressing (OT): Dependent Is this a change from baseline?: Pre-admission baseline Grooming: Needs assistance Is this a change from baseline?: Pre-admission baseline Feeding: Independent Bathing: Dependent Is this a change from baseline?: Pre-admission baseline Toileting: Dependent Is this a change from baseline?: Pre-admission baseline In/Out Bed: Dependent Is this a change from baseline?: Pre-admission baseline Walks in Home:  Dependent Is this a change from baseline?: Pre-admission baseline Does the patient have difficulty walking or climbing stairs?: Yes Weakness of Legs: Both Weakness of Arms/Hands: Both  Permission Sought/Granted                  Emotional Assessment              Admission diagnosis:  Sepsis due to undetermined organism (HCC) [A41.9] Sepsis, due to unspecified organism, unspecified whether acute organ dysfunction present [facility]) [A41.9] Patient Active Problem List   Diagnosis Date Noted   Sepsis due to undetermined organism (HCC) 07-18-23   Hypercalcemia of malignancy 07/18/23   Bilateral pulmonary embolism (HCC) 07-18-23   Pressure injury of skin 07/18/23   Carcinomatosis (HCC) Jul 18, 2023   Cellulitis of chest wall 2023/07/18   Cancer related pain 05/30/2023   High risk medication use 05/27/2023   Medication management 05/26/2023   Counseling and coordination of care 05/26/2023   Intractable pain 05/19/2023   Non compliance with medical treatment 05/08/2023   Anemia due to antineoplastic chemotherapy 04/07/2023   Weight loss, non-intentional 01/22/2023   MSSA (methicillin susceptible Staphylococcus aureus) infection 08/12/2022   Empyema, right (HCC) 08/11/2022   Need for management of chest tube 08/11/2022   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   Need for emotional support 08/05/2022   Constipation 08/05/2022   Palliative care encounter 08/05/2022  Protein-calorie malnutrition, severe 08/05/2022   Pleural effusion, right 08/04/2022   Pleural effusion on right 08/02/2022   Protein calorie malnutrition (HCC) 08/02/2022   Goals of care, counseling/discussion 07/25/2022   Vitamin D deficiency 06/12/2022   Situational depression 05/30/2022   GERD (gastroesophageal reflux disease) 05/30/2022   Pancytopenia, acquired (HCC) 05/08/2022   Wound healing, delayed 05/08/2022   Physical deconditioning 04/17/2022   Poor social situation 04/02/2022   Cancer  associated pain 03/31/2022   Poor venous access 03/31/2022   Other constipation 03/31/2022   Breast cancer metastasized to bone, right (HCC) 03/23/2022   Breast cancer, stage 4, right (HCC) 03/22/2022   Pleural effusion 03/22/2022   Cellulitis 03/22/2022   Microcytic anemia 03/22/2022   Thrombocytosis 03/22/2022   Lactic acidosis 03/22/2022   Essential hypertension 01/27/2018   Screening for diabetes mellitus 01/27/2018   Screening for thyroid disorder 01/27/2018   PCP:  Artis Delay, MD Pharmacy:   [facility] [address] [address] Phone: [phone] Fax: [phone]  [facility] [place], [place] - [place] [address] [place] [address] Phone: [phone] Fax: [phone]     Social Determinants of Health (SDOH) Social History: SDOH Screenings   Food Insecurity: No Food Insecurity (02-Jul-2023)  Housing: Low Risk  (02-Jul-2023)  Transportation Needs: No Transportation Needs (07-02-2023)  Utilities: Not At Risk (2023-07-02)  Financial Resource Strain: Low Risk  (04/03/2022)  Tobacco Use: Low Risk  (02-Jul-2023)   SDOH Interventions:     Readmission Risk Interventions    08/30/2022    1:46 PM 08/06/2022   10:01 AM  Readmission Risk Prevention Plan  Transportation Screening Complete   PCP or Specialist Appt within 3-5 Days Complete   HRI or Home Care Consult Complete   Social Work Consult for Recovery Care Planning/Counseling Complete   Palliative Care Screening Not Applicable   Medication Review [employer]) Complete Complete  PCP or Specialist appointment within 3-5 days of discharge  Complete  HRI or Home Care Consult  Complete  SW Recovery Care/Counseling Consult  Complete  Palliative Care Screening  Not Applicable  Skilled Nursing Facility  Complete

## 2023-06-23 DIAGNOSIS — Z515 Encounter for palliative care: Secondary | ICD-10-CM | POA: Diagnosis not present

## 2023-06-23 DIAGNOSIS — Z7189 Other specified counseling: Secondary | ICD-10-CM

## 2023-06-23 DIAGNOSIS — C800 Disseminated malignant neoplasm, unspecified: Secondary | ICD-10-CM

## 2023-06-23 DIAGNOSIS — Z711 Person with feared health complaint in whom no diagnosis is made: Secondary | ICD-10-CM

## 2023-06-23 DIAGNOSIS — I2699 Other pulmonary embolism without acute cor pulmonale: Secondary | ICD-10-CM | POA: Diagnosis not present

## 2023-06-23 DIAGNOSIS — R4589 Other symptoms and signs involving emotional state: Secondary | ICD-10-CM

## 2023-06-23 DIAGNOSIS — Z66 Do not resuscitate: Secondary | ICD-10-CM

## 2023-06-23 DIAGNOSIS — A419 Sepsis, unspecified organism: Secondary | ICD-10-CM

## 2023-06-23 DIAGNOSIS — G893 Neoplasm related pain (acute) (chronic): Secondary | ICD-10-CM | POA: Diagnosis not present

## 2023-06-23 LAB — COMPREHENSIVE METABOLIC PANEL
ALT: 10 U/L (ref 0–44)
AST: 59 U/L — ABNORMAL HIGH (ref 15–41)
Albumin: 2.1 g/dL — ABNORMAL LOW (ref 3.5–5.0)
Alkaline Phosphatase: 84 U/L (ref 38–126)
Anion gap: 9 (ref 5–15)
BUN: 31 mg/dL — ABNORMAL HIGH (ref 8–23)
CO2: 22 mmol/L (ref 22–32)
Calcium: 11.3 mg/dL — ABNORMAL HIGH (ref 8.9–10.3)
Chloride: 105 mmol/L (ref 98–111)
Creatinine, Ser: 0.8 mg/dL (ref 0.44–1.00)
GFR, Estimated: >60 mL/min (ref >60)
Glucose, Bld: 86 mg/dL (ref 70–99)
Potassium: 4.3 mmol/L (ref 3.5–5.1)
Sodium: 136 mmol/L (ref 135–145)
Total Bilirubin: 0.5 mg/dL (ref 0.3–1.2)
Total Protein: 6.3 g/dL — ABNORMAL LOW (ref 6.5–8.1)

## 2023-06-23 LAB — CBC WITH DIFFERENTIAL/PLATELET
Abs Immature Granulocytes: 0.03 10*3/uL (ref 0.00–0.07)
Basophils Absolute: 0.1 10*3/uL (ref 0.0–0.1)
Basophils Relative: 1 %
Eosinophils Absolute: 0.1 10*3/uL (ref 0.0–0.5)
Eosinophils Relative: 1 %
HCT: 42.4 % (ref 36.0–46.0)
Hemoglobin: 12.6 g/dL (ref 12.0–15.0)
Immature Granulocytes: 0 %
Lymphocytes Relative: 18 %
Lymphs Abs: 1.4 10*3/uL (ref 0.7–4.0)
MCH: 26.9 pg (ref 26.0–34.0)
MCHC: 29.7 g/dL — ABNORMAL LOW (ref 30.0–36.0)
MCV: 90.4 fL (ref 80.0–100.0)
Monocytes Absolute: 1 10*3/uL (ref 0.1–1.0)
Monocytes Relative: 12 %
Neutro Abs: 5.6 10*3/uL (ref 1.7–7.7)
Neutrophils Relative %: 68 %
Platelets: 390 10*3/uL (ref 150–400)
RBC: 4.69 MIL/uL (ref 3.87–5.11)
RDW: 17.2 % — ABNORMAL HIGH (ref 11.5–15.5)
WBC: 8.2 10*3/uL (ref 4.0–10.5)
nRBC: 0 % (ref 0.0–0.2)

## 2023-06-23 MED ORDER — HALOPERIDOL LACTATE 5 MG/ML IJ SOLN
1.0000 mg | INTRAMUSCULAR | Status: DC | PRN
  Administered 2023-06-24: 1 mg via INTRAVENOUS
  Filled 2023-06-23: qty 1

## 2023-06-23 MED ORDER — HYDROMORPHONE HCL 1 MG/ML IJ SOLN
2.0000 mg | INTRAMUSCULAR | Status: DC | PRN
  Administered 2023-06-23: 3 mg via INTRAVENOUS
  Filled 2023-06-23: qty 3

## 2023-06-23 MED ORDER — HYDROMORPHONE HCL-NACL 50-0.9 MG/50ML-% IV SOLN
4.0000 mg/h | INTRAVENOUS | Status: DC
  Administered 2023-06-23: 0.5 mg/h via INTRAVENOUS
  Administered 2023-06-24 – 2023-06-25 (×3): 4 mg/h via INTRAVENOUS
  Filled 2023-06-23 (×4): qty 50

## 2023-06-23 MED ORDER — HALOPERIDOL LACTATE 2 MG/ML PO CONC: 0.5000 mg | ORAL | Status: DC | PRN

## 2023-06-23 MED ORDER — BIOTENE DRY MOUTH MT LIQD: 15.0000 mL | OROMUCOSAL | Status: DC | PRN

## 2023-06-23 MED ORDER — POLYVINYL ALCOHOL 1.4 % OP SOLN: 1.0000 [drp] | Freq: Four times a day (QID) | OPHTHALMIC | Status: DC | PRN

## 2023-06-23 MED ORDER — CEPHALEXIN 500 MG PO CAPS
500.0000 mg | ORAL_CAPSULE | Freq: Three times a day (TID) | ORAL | Status: DC
  Administered 2023-06-23: 500 mg via ORAL
  Filled 2023-06-23: qty 1

## 2023-06-23 MED ORDER — HYDROMORPHONE BOLUS VIA INFUSION
1.0000 mg | INTRAVENOUS | Status: DC | PRN
  Administered 2023-06-23 – 2023-06-24 (×5): 1 mg via INTRAVENOUS

## 2023-06-23 MED ORDER — GLYCOPYRROLATE 0.2 MG/ML IJ SOLN: 0.2000 mg | INTRAMUSCULAR | Status: DC | PRN

## 2023-06-23 MED ORDER — METRONIDAZOLE 0.75 % EX GEL: Freq: Every day | CUTANEOUS | Status: DC | PRN

## 2023-06-23 NOTE — Progress Notes (Signed)
[name]   DOB:Dec 03, 1960   ON#:[id number]    ASSESSMENT & PLAN:  Metastatic breast cancer She has significant decline in performance status The patient is not a candidate for chemotherapy I recommend transitioning her care to comfort care and hospice   Malignant hypercalcemia Due to poor hydration as well as metastatic disease Continue IV fluids and she has received Zometa I recommend discontinuation of blood draw   Delayed wound healing Unfortunately, I do not anticipate her wound is going to heal with her ongoing poor oral intake and severe malnutrition Recommend discontinuation of aggressive wound dressing   Cancer associated pain Appreciate palliative care consult I will defer to palliative care team for pain management   PE Due to poor oral initake, active cancer and dehydration   Severe malnutrition, malignant cachexia As above, the patient is actively dying She can eat what ever she wants but at this point, I do not think her intake is meaningful   Goals of care discussion We have extensive goals of care discussion today.  Prior to my departure, it appears that the patient has all questions addressed from the oncology standpoint and is in agreement to transition care to comfort measures including hospice services. I truly appreciate palliative care consult and management in this regard  All questions were answered. The patient knows to call the clinic with any problems, questions or concerns.   The total time spent in the appointment was 40 minutes encounter with patients including review of chart and various tests results, discussions about plan of care and coordination of care plan  Artis Delay, MD 06/23/2023 3:46 PM  Subjective:  I have another goals of care meeting with the family.  Her sister, Gavin Pound is on the phone.  Her mother is by the bedside.  Another family member, her cousin is also by the bedside.  Dr. Patterson Hammersmith joined the discussion as well We discussed  the rationale behind not pursuing further systemic palliative chemotherapy.  I recommend with focus on comfort measures especially in terms of pain control.  I addressed questions from her family and the patient.    Objective:  Vitals:   06/23/23 0806 06/23/23 1231  BP: 110/72 98/63  Pulse: (!) 117 (!) 117  Resp: 20 14  Temp: 97.8 F (36.6 C) 98.7 F (37.1 C)  SpO2: 99% 95%     Intake/Output Summary (Last 24 hours) at 06/23/2023 1546 Last data filed at 06/23/2023 0530 Gross per 24 hour  Intake 2091.42 ml  Output 500 ml  Net 1591.42 ml    GENERAL:alert but appears intermittently confused and sedated   Labs:  Recent Labs    06/12/2023 1216 06/22/23 0450 06/22/23 1442 06/23/23 0551  NA 137 134* 139 136  K 4.9 4.7 5.6* 4.3  CL 104 100 104 105  CO2 26 25 26 22   GLUCOSE 102* 78 79 86  BUN 45* 36* 36* 31*  CREATININE 0.82 0.73 0.68 0.80  CALCIUM 12.9* 12.4* 11.9* 11.3*  GFRNONAA >60 >60 >60 >60  PROT 6.6 6.3*  --  6.3*  ALBUMIN 2.2* 2.1*  --  2.1*  AST 56* 53*  --  59*  ALT 9 10  --  10  ALKPHOS 88 84  --  84  BILITOT 0.7 0.7  --  0.5    Studies:  CT Angio Chest Pulmonary Embolism (PE) W or WO Contrast  Result Date: 06/06/2023 CLINICAL DATA:  History of breast cancer with chronic chest wound and one-week history of increasing weakness.  [name]   DOB:Dec 03, 1960   ON#:[id number]    ASSESSMENT & PLAN:  Metastatic breast cancer She has significant decline in performance status The patient is not a candidate for chemotherapy I recommend transitioning her care to comfort care and hospice   Malignant hypercalcemia Due to poor hydration as well as metastatic disease Continue IV fluids and she has received Zometa I recommend discontinuation of blood draw   Delayed wound healing Unfortunately, I do not anticipate her wound is going to heal with her ongoing poor oral intake and severe malnutrition Recommend discontinuation of aggressive wound dressing   Cancer associated pain Appreciate palliative care consult I will defer to palliative care team for pain management   PE Due to poor oral initake, active cancer and dehydration   Severe malnutrition, malignant cachexia As above, the patient is actively dying She can eat what ever she wants but at this point, I do not think her intake is meaningful   Goals of care discussion We have extensive goals of care discussion today.  Prior to my departure, it appears that the patient has all questions addressed from the oncology standpoint and is in agreement to transition care to comfort measures including hospice services. I truly appreciate palliative care consult and management in this regard  All questions were answered. The patient knows to call the clinic with any problems, questions or concerns.   The total time spent in the appointment was 40 minutes encounter with patients including review of chart and various tests results, discussions about plan of care and coordination of care plan  Artis Delay, MD 06/23/2023 3:46 PM  Subjective:  I have another goals of care meeting with the family.  Her sister, Gavin Pound is on the phone.  Her mother is by the bedside.  Another family member, her cousin is also by the bedside.  Dr. Patterson Hammersmith joined the discussion as well We discussed  the rationale behind not pursuing further systemic palliative chemotherapy.  I recommend with focus on comfort measures especially in terms of pain control.  I addressed questions from her family and the patient.    Objective:  Vitals:   06/23/23 0806 06/23/23 1231  BP: 110/72 98/63  Pulse: (!) 117 (!) 117  Resp: 20 14  Temp: 97.8 F (36.6 C) 98.7 F (37.1 C)  SpO2: 99% 95%     Intake/Output Summary (Last 24 hours) at 06/23/2023 1546 Last data filed at 06/23/2023 0530 Gross per 24 hour  Intake 2091.42 ml  Output 500 ml  Net 1591.42 ml    GENERAL:alert but appears intermittently confused and sedated   Labs:  Recent Labs    06/12/2023 1216 06/22/23 0450 06/22/23 1442 06/23/23 0551  NA 137 134* 139 136  K 4.9 4.7 5.6* 4.3  CL 104 100 104 105  CO2 26 25 26 22   GLUCOSE 102* 78 79 86  BUN 45* 36* 36* 31*  CREATININE 0.82 0.73 0.68 0.80  CALCIUM 12.9* 12.4* 11.9* 11.3*  GFRNONAA >60 >60 >60 >60  PROT 6.6 6.3*  --  6.3*  ALBUMIN 2.2* 2.1*  --  2.1*  AST 56* 53*  --  59*  ALT 9 10  --  10  ALKPHOS 88 84  --  84  BILITOT 0.7 0.7  --  0.5    Studies:  CT Angio Chest Pulmonary Embolism (PE) W or WO Contrast  Result Date: 06/06/2023 CLINICAL DATA:  History of breast cancer with chronic chest wound and one-week history of increasing weakness.  [name]   DOB:Dec 03, 1960   ON#:[id number]    ASSESSMENT & PLAN:  Metastatic breast cancer She has significant decline in performance status The patient is not a candidate for chemotherapy I recommend transitioning her care to comfort care and hospice   Malignant hypercalcemia Due to poor hydration as well as metastatic disease Continue IV fluids and she has received Zometa I recommend discontinuation of blood draw   Delayed wound healing Unfortunately, I do not anticipate her wound is going to heal with her ongoing poor oral intake and severe malnutrition Recommend discontinuation of aggressive wound dressing   Cancer associated pain Appreciate palliative care consult I will defer to palliative care team for pain management   PE Due to poor oral initake, active cancer and dehydration   Severe malnutrition, malignant cachexia As above, the patient is actively dying She can eat what ever she wants but at this point, I do not think her intake is meaningful   Goals of care discussion We have extensive goals of care discussion today.  Prior to my departure, it appears that the patient has all questions addressed from the oncology standpoint and is in agreement to transition care to comfort measures including hospice services. I truly appreciate palliative care consult and management in this regard  All questions were answered. The patient knows to call the clinic with any problems, questions or concerns.   The total time spent in the appointment was 40 minutes encounter with patients including review of chart and various tests results, discussions about plan of care and coordination of care plan  Artis Delay, MD 06/23/2023 3:46 PM  Subjective:  I have another goals of care meeting with the family.  Her sister, Gavin Pound is on the phone.  Her mother is by the bedside.  Another family member, her cousin is also by the bedside.  Dr. Patterson Hammersmith joined the discussion as well We discussed  the rationale behind not pursuing further systemic palliative chemotherapy.  I recommend with focus on comfort measures especially in terms of pain control.  I addressed questions from her family and the patient.    Objective:  Vitals:   06/23/23 0806 06/23/23 1231  BP: 110/72 98/63  Pulse: (!) 117 (!) 117  Resp: 20 14  Temp: 97.8 F (36.6 C) 98.7 F (37.1 C)  SpO2: 99% 95%     Intake/Output Summary (Last 24 hours) at 06/23/2023 1546 Last data filed at 06/23/2023 0530 Gross per 24 hour  Intake 2091.42 ml  Output 500 ml  Net 1591.42 ml    GENERAL:alert but appears intermittently confused and sedated   Labs:  Recent Labs    06/13/2023 1216 06/22/23 0450 06/22/23 1442 06/23/23 0551  NA 137 134* 139 136  K 4.9 4.7 5.6* 4.3  CL 104 100 104 105  CO2 26 25 26 22   GLUCOSE 102* 78 79 86  BUN 45* 36* 36* 31*  CREATININE 0.82 0.73 0.68 0.80  CALCIUM 12.9* 12.4* 11.9* 11.3*  GFRNONAA >60 >60 >60 >60  PROT 6.6 6.3*  --  6.3*  ALBUMIN 2.2* 2.1*  --  2.1*  AST 56* 53*  --  59*  ALT 9 10  --  10  ALKPHOS 88 84  --  84  BILITOT 0.7 0.7  --  0.5    Studies:  CT Angio Chest Pulmonary Embolism (PE) W or WO Contrast  Result Date: 06/08/2023 CLINICAL DATA:  History of breast cancer with chronic chest wound and one-week history of increasing weakness.  [name]   DOB:Dec 03, 1960   ON#:[id number]    ASSESSMENT & PLAN:  Metastatic breast cancer She has significant decline in performance status The patient is not a candidate for chemotherapy I recommend transitioning her care to comfort care and hospice   Malignant hypercalcemia Due to poor hydration as well as metastatic disease Continue IV fluids and she has received Zometa I recommend discontinuation of blood draw   Delayed wound healing Unfortunately, I do not anticipate her wound is going to heal with her ongoing poor oral intake and severe malnutrition Recommend discontinuation of aggressive wound dressing   Cancer associated pain Appreciate palliative care consult I will defer to palliative care team for pain management   PE Due to poor oral initake, active cancer and dehydration   Severe malnutrition, malignant cachexia As above, the patient is actively dying She can eat what ever she wants but at this point, I do not think her intake is meaningful   Goals of care discussion We have extensive goals of care discussion today.  Prior to my departure, it appears that the patient has all questions addressed from the oncology standpoint and is in agreement to transition care to comfort measures including hospice services. I truly appreciate palliative care consult and management in this regard  All questions were answered. The patient knows to call the clinic with any problems, questions or concerns.   The total time spent in the appointment was 40 minutes encounter with patients including review of chart and various tests results, discussions about plan of care and coordination of care plan  Artis Delay, MD 06/23/2023 3:46 PM  Subjective:  I have another goals of care meeting with the family.  Her sister, Gavin Pound is on the phone.  Her mother is by the bedside.  Another family member, her cousin is also by the bedside.  Dr. Patterson Hammersmith joined the discussion as well We discussed  the rationale behind not pursuing further systemic palliative chemotherapy.  I recommend with focus on comfort measures especially in terms of pain control.  I addressed questions from her family and the patient.    Objective:  Vitals:   06/23/23 0806 06/23/23 1231  BP: 110/72 98/63  Pulse: (!) 117 (!) 117  Resp: 20 14  Temp: 97.8 F (36.6 C) 98.7 F (37.1 C)  SpO2: 99% 95%     Intake/Output Summary (Last 24 hours) at 06/23/2023 1546 Last data filed at 06/23/2023 0530 Gross per 24 hour  Intake 2091.42 ml  Output 500 ml  Net 1591.42 ml    GENERAL:alert but appears intermittently confused and sedated   Labs:  Recent Labs    06/12/2023 1216 06/22/23 0450 06/22/23 1442 06/23/23 0551  NA 137 134* 139 136  K 4.9 4.7 5.6* 4.3  CL 104 100 104 105  CO2 26 25 26 22   GLUCOSE 102* 78 79 86  BUN 45* 36* 36* 31*  CREATININE 0.82 0.73 0.68 0.80  CALCIUM 12.9* 12.4* 11.9* 11.3*  GFRNONAA >60 >60 >60 >60  PROT 6.6 6.3*  --  6.3*  ALBUMIN 2.2* 2.1*  --  2.1*  AST 56* 53*  --  59*  ALT 9 10  --  10  ALKPHOS 88 84  --  84  BILITOT 0.7 0.7  --  0.5    Studies:  CT Angio Chest Pulmonary Embolism (PE) W or WO Contrast  Result Date: 06/06/2023 CLINICAL DATA:  History of breast cancer with chronic chest wound and one-week history of increasing weakness.

## 2023-06-23 NOTE — Plan of Care (Signed)
  Problem: Fluid Volume: Goal: Hemodynamic stability will improve Outcome: Not Progressing   Problem: Coping: Goal: Level of anxiety will decrease Outcome: Progressing   Problem: Pain Managment: Goal: General experience of comfort will improve Outcome: Progressing   Problem: Safety: Goal: Ability to remain free from injury will improve Outcome: Progressing

## 2023-06-23 NOTE — Plan of Care (Signed)
  Problem: Education: Goal: Knowledge of the prescribed therapeutic regimen will improve Outcome: Progressing   Problem: Clinical Measurements: Goal: Quality of life will improve Outcome: Progressing   Problem: Coping: Goal: Ability to identify and develop effective coping behavior will improve Outcome: Progressing

## 2023-06-23 NOTE — Progress Notes (Signed)
Daily Progress Note   Patient Name: [name]       Date: 06/23/2023 DOB: [birth year]  Age: 62 y.o. MRN#: [id number] Attending Physician: Rolly Salter, MD Primary Care Physician: Artis Delay, MD Admit Date: 06/28/2023 Length of Stay: 2 days  Reason for Consultation/Follow-up: Establishing goals of care  Subjective:   CC: Patient notes she remains in pain. Following up regarding complex medical decision making and symptom management.   Subjective:  Reviewed EMR prior to presenting to bedside.  Patient's oncologist, Dr. Bertis Ruddy-, has continued to have goals of care discussions with patient all family at bedside.  Patient is not a candidate for further chemotherapy in setting of her metastatic breast cancer.  Patient's overall medical status has continued to rapidly deteriorate despite aggressive medical interventions. Patient is well-known to palliative medicine team and has had difficult to manage pain previously.  Patient remains on MS Contin 30 mg every 8 hours.  Patient also receiving IV Dilaudid 2 mg x 1 dose and 1 mg x 4 doses in past 24 hours.  Patient had previously been on oral Dilaudid though this was discontinued as patient no longer getting benefit from oral Dilaudid medication.  Presented to bedside to meet with patient.  Patient laying in bed grimacing at times.  Reintroduced myself as a member of the palliative medicine team though patient remembers this provider from prior interactions.  Patient able to states she "went downhill" after her last discharge.  Empathized with difficult situation and spent time providing emotional support via active listening.  Did discuss with patient appears much more fatigued than she did previously and that she is continuing to lose weight and already cachectic state.  With patient's permission, expressed concerns that patient's body is shutting down and she is reaching the end of her life.  Patient acknowledged this though admitted difficulty  to "giving up".  Discussed home patient has continued to fight this hormonal disease for a long time and her body is tired which is a normal part of the disease process.  Patient acknowledges and voiced appreciation for returning later in afternoon with oncologist when her family comes to visit.  Also spent time reviewing patient's symptom burden at this time.  Patient notes that none of the pain medications have been managing her pain appropriately.  Patient feels the only medications that remotely touches her pain is the IV Dilaudid.  Discussed can increase range on IV Dilaudid which patient agrees with requiring at this time due to her intense pain. After discussion, noted would return later on in afternoon to discuss care with patient's family and oncologist.  Patient voiced appreciation for visit today.   Return to bedside later in afternoon once Dr. Bertis Ruddy present with family including patient's mother and friend, Olegario Messier.  Patient's sister, Stanton Kidney, joined via speaker phone.  Reviewed again that patient is not a candidate for any further cancer directed therapies.  Spent time discussing a transition to comfort focused care to allow for pain management at the end of life.  Spent time discussing how patient's medical status has continued to worsen despite aggressive medical interventions.  Spent time discussing in detail what a transition to comfort focused management would allow such as discontinuing interventions such as IV fluids, imaging, lab work, and any other interventions that cause patient discomfort.  Would instead provide patient with medications for pain, nausea vomiting, anxiety, and any symptoms for end-of-life care.  Discussed how patient is at the end of her life and her body  is shutting down.  Discussed the idea of comfort feeding.  Also reviewed all of patient's medication and discontinuation of medications such as antibiotics as patient now having difficulty swallowing pills. Also  converting patient's MS contin to IV dilaudid since this is the only medication that assists at all with pain. After extensive discussion, patient agreeing with transition to comfort focused care at the end of life.  Family supporting patient's decision in this.  Discussed initiating comfort focused care here in the hospital.  Discussed possibility of inpatient hospice evaluation as well.  Spent time discussing support inpatient hospice would provide.  Patient and family would want to stay in [place] potentially at [facility] if opted for inpatient hospice.  Discussed how to focus at this time to be on patient's pain management and we will continue discussions regarding inpatient hospice evaluation as family is appropriately overwhelmed at this time to make any further decisions.  Spent time answering questions and providing emotional support.  Noted palliative medicine team will continue to follow along with patient's medical journey.  Updated IDT after visit regarding transition to comfort focused care at this time.  Review of Systems Pain not controlled Objective:   Vital Signs:  BP 110/72 (BP Location: Left Arm)   Pulse (!) 117   Temp 97.8 F (36.6 C) (Oral)   Resp 16   Wt 64.2 kg   SpO2 99%   BMI 22.84 kg/m   Physical Exam: General: Ill-appearing, extremely cachectic and frail, laying in bed Eyes: No drainage noted HENT: dry mucous membranes Cardiovascular: Tachycardia noted Respiratory: Slightly increased work of breathing noted, not in respiratory distress Skin: Chronic nonhealing wound on right side of chest going to back Neuro: Awake, interactive, pleasant Psych: appropriately answers all questions  Imaging: I personally reviewed recent imaging.   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  Oncologist, Dr. Bertis Ruddy, involved to assist with complex medical decision making.  Recommendations/Plan: # Complex medical decision making/goals of care:  -Extensive discussion initially with patient and then with patient in presence of patient's mother/friend/sister via speaker phone along with oncologist, Dr. Bertis Ruddy.  Not a candidate for further cancer directed therapies.  Despite aggressive medical interventions patient's medical status has continued to rapidly deteriorate.  Patient and family agreeing with transition to comfort focused care at this time as detailed above in HPI.   -At this time we will discontinue interventions that are no longer focused on comfort such as IV fluids, imaging, abx, or lab work.  Will instead focus on symptom management of pain, dyspnea, and agitation in the setting of end-of-life care.  - Code Status: DNR   # Symptom management:  -Pain, in setting of metastatic breast cancer at end of life care Based on patient's OME requirements including MS Contin 30 mg every 8 hours scheduled, with additional IV Dilaudid dosing of 6 mg, patient requiring approximately 165 OME currently without pain being appropriately managed.   -Discontinue MS Contin 30 mg every 8 hours with patient's now difficulty swallowing pills   -Start IV dilaudid continuous infusion at 0.5mg /hr with IV dilaudid bolus of 1mg  q prn. Continue to titrate based on symptom burden and response.                    -  Anxiety/agitation, in the setting of end-of-life care                               -Start IV Haldol 1 mg every 4 hours as needed. Continue to adjust based on patient's symptom burden.                   -Secretions, in the setting of end-of-life care                                -Start IV glycopyrrolate 0.2 mg every 4 hours as needed.  # Psychosocial Support:  -mother, sister, friend   # Discharge Planning: Patient transitioned to comfort focused care at this time.  Anticipate in-hospital death versus evaluation for inpatient hospice transfer.  Discussed with: patient, RN, oncologist, patient's mother/friend/sister  Thank you for allowing the palliative care team to participate in the care Deirdre Priest.  Alvester Morin, DO Palliative Care Provider PMT # [phone]  If patient remains symptomatic despite maximum doses, please call PMT at [phone] between 0700 and 1900. Outside of these hours, please call attending, as PMT does not have night coverage.  This provider spent a total of 65 minutes providing patient's care.  Includes review of EMR, discussing care with other staff members involved in patient's medical care, obtaining relevant history and information from patient and/or patient's family, and personal review of imaging and lab work. Greater than 50% of the time was spent counseling and coordinating care related to the above assessment and plan.    *Please note that this is a verbal dictation therefore any spelling or grammatical errors are due to the "[employer]" system interpretation.

## 2023-06-23 NOTE — Progress Notes (Signed)
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 Found to have a hyprkalemia, hypercalcemia, suspected sepsis in the setting of cellulitis. Oncology following.  Palliative care following.  Appreciate assistance. Now complete comfort care.  Assessment and Plan: Concern for sepsis most likely secondary to cellulitis. Met SIRS.  At the time of admission with tachycardia and tachypnea. Although suspect this presentation is mostly secondary to PE rather than SIRS.  Acute pulmonary evaluation. In the setting of malignancy. On Lovenox.  Hypercalcemia of malignancy. Currently treated with IV hydration. Prognosis very poor.  Breast cancer with extensive metastasis Uncontrolled pain.  Chronic related to cancer. Palliative care following. Highly appreciate palliative care as well as oncology consultation. Patient's prognosis is very poor.  Goals of care conversation. Highly appreciate oncology assistance as well as palliative care assistance. Prognosis is very poor.  Could be days to weeks. Pain is severely uncontrolled. Now on complete comfort.  Most likely anticipating hospital death.  Constipation. Continue current regimen.  Severe protein calorie malnutrition. Adding to poor prognosis. Monitor.  Stage III medial coccyx ulcer Present on admission. Foam dressing.   Subjective: Pain uncontrolled.  No nausea no vomiting.  Minimal oral intake.  Physical Exam: Appears in moderate to severe distress. Multiple skin lesions seen. No purulent discharge. S1-S2 present. Tachycardic. Bowel sound present. Tachypneic.  Some crackles heard. Bilateral lower  extremity edema seen. Alert, oriented to self and place.  Intermittently drowsy.  Data Reviewed: I have Reviewed nursing notes, Vitals, and Lab results. Since last encounter, pertinent lab results CBC and BMP   . I have discussed pt's care plan and test results with oncology and palliative care  .   Disposition: Status is: Inpatient Remains inpatient appropriate because: Anticipating hospital death   Family Communication: No family at bedside.  Discussed with mother on 9/23. Level of care: Progressive   Vitals:   06/22/23 2354 06/23/23 0407 06/23/23 0806 06/23/23 1231  BP: 112/74 109/72 110/72 98/63  Pulse: (!) 122 (!) 117 (!) 117 (!) 117  Resp: 17 16 20 14   Temp: 98.5 F (36.9 C) 97.6 F (36.4 C) 97.8 F (36.6 C) 98.7 F (37.1 C)  TempSrc: Oral Oral Oral Oral  SpO2: 97% 98% 99% 95%  Weight:         Author: Lynden Oxford, MD 06/23/2023 7:19 PM  Please look on [email or web address] to find out who is on call.

## 2023-06-24 DIAGNOSIS — E43 Unspecified severe protein-calorie malnutrition: Secondary | ICD-10-CM

## 2023-06-24 DIAGNOSIS — L03313 Cellulitis of chest wall: Secondary | ICD-10-CM | POA: Diagnosis not present

## 2023-06-24 DIAGNOSIS — K219 Gastro-esophageal reflux disease without esophagitis: Secondary | ICD-10-CM | POA: Diagnosis not present

## 2023-06-24 DIAGNOSIS — A419 Sepsis, unspecified organism: Secondary | ICD-10-CM | POA: Diagnosis not present

## 2023-06-24 DIAGNOSIS — Z515 Encounter for palliative care: Secondary | ICD-10-CM

## 2023-06-24 DIAGNOSIS — C50911 Malignant neoplasm of unspecified site of right female breast: Secondary | ICD-10-CM

## 2023-06-24 DIAGNOSIS — K5909 Other constipation: Secondary | ICD-10-CM

## 2023-06-24 DIAGNOSIS — C7951 Secondary malignant neoplasm of bone: Secondary | ICD-10-CM

## 2023-06-24 DIAGNOSIS — I2699 Other pulmonary embolism without acute cor pulmonale: Secondary | ICD-10-CM | POA: Diagnosis not present

## 2023-06-24 DIAGNOSIS — G893 Neoplasm related pain (acute) (chronic): Secondary | ICD-10-CM | POA: Diagnosis not present

## 2023-06-24 MED ORDER — LORAZEPAM 2 MG/ML IJ SOLN: 1.0000 mg | INTRAMUSCULAR | Status: DC

## 2023-06-24 MED ORDER — HYDROCORTISONE (PERIANAL) 2.5 % EX CREA
1.0000 | TOPICAL_CREAM | Freq: Four times a day (QID) | CUTANEOUS | Status: DC | PRN
  Filled 2023-06-24: qty 28.35

## 2023-06-24 MED ORDER — POLYETHYLENE GLYCOL 3350 17 G PO PACK: 17.0000 g | PACK | Freq: Every day | ORAL | Status: DC

## 2023-06-24 MED ORDER — HYDROMORPHONE BOLUS VIA INFUSION
5.0000 mg | INTRAVENOUS | Status: DC | PRN
  Administered 2023-06-24 – 2023-06-25 (×3): 5 mg via INTRAVENOUS

## 2023-06-24 MED ORDER — LORAZEPAM 2 MG/ML IJ SOLN
1.0000 mg | INTRAMUSCULAR | Status: DC | PRN
  Administered 2023-06-24: 1 mg via INTRAVENOUS
  Filled 2023-06-24: qty 1

## 2023-06-24 MED ORDER — HYDROMORPHONE BOLUS VIA INFUSION
4.0000 mg | INTRAVENOUS | Status: DC | PRN
  Administered 2023-06-24 (×4): 4 mg via INTRAVENOUS

## 2023-06-24 MED ORDER — SENNOSIDES-DOCUSATE SODIUM 8.6-50 MG PO TABS: 1.0000 | ORAL_TABLET | Freq: Two times a day (BID) | ORAL | Status: DC

## 2023-06-24 NOTE — Progress Notes (Addendum)
Daily Progress Note   Patient Name: [name]       Date: 06/24/2023 DOB: 07-10-1961  Age: 62 y.o. MRN#: [id number] Attending Physician: Rodolph Bong, MD Primary Care Physician: Artis Delay, MD Admit Date: 06/05/2023 Length of Stay: 3 days  Reason for Consultation/Follow-up: Establishing goals of care  Subjective:   CC: Patient's pain worsened overnight. Following up regarding complex medical decision making and symptom management.   Subjective:  Reviewed EMR prior to presenting to bedside.  At time of EMR review patient has continued to receive IV Dilaudid 0.5 mg/h continuous infusion.  Patient also required bolus dosing of IV Dilaudid 1 mg every 15 minutes x 5 doses.  Patient also required Haldol 1 mg x 1 dose for agitation this morning.  Presented to bedside to meet with patient.  No family present at bedside during visit.  Patient welcoming visit this morning.  She notes that her pain worsened overnight.  Discussed increasing her IV Dilaudid continuous infusion and bolus dosing since her goal remains comfort focused.  Discussed if these adjustments do not improve her pain, can continue to adjust based on her response.  Patient agreement with this plan.  Patient's only other concerns or wanting to eat some strawberry Jell-O and to receive a bath.  Noted would inform staff of this to assist.  All questions answered at that time.  Provided emotional support via active listening.  Noted palliative medicine team will continue to follow along to assist with patient's medical care.  Review of Systems Pain not controlled Objective:   Vital Signs:  BP 98/63 (BP Location: Left Arm)   Pulse (!) 117   Temp 98.7 F (37.1 C) (Oral)   Resp 14   Wt 64.2 kg   SpO2 95%   BMI 22.84 kg/m   Physical Exam: General: Ill-appearing, extremely cachectic and frail, laying in bed Eyes: No drainage noted HENT: dry mucous membranes Cardiovascular: Tachycardia noted Respiratory: Slightly  increased work of breathing noted, not in respiratory distress Skin: Chronic nonhealing wound on right side of chest going to back Neuro: Awake, interactive, pleasant Psych: appropriately answers all questions  Imaging: I personally reviewed recent imaging.   Assessment & Plan:   Assessment: Patient is a 62 year old female medical history of stage IV breast cancer with metastatic disease to bone and malignant pleural effusion, GERD, hypertension, constipation, chronic nonhealing wound on right side of chest, and severe protein calorie malnutrition who was admitted on 06/22/2023 for management of cellulitis of chest wall, malignant hypercalcemia, and delayed wound healing.  Oncologist, Dr. Bertis Ruddy, involved to assist with complex medical decision making.  Recommendations/Plan: # Complex medical decision making/goals of care:  -Patient and family agreed with transition to comfort focused care on 06/23/2023.  -Have discontinued interventions that are no longer focused on comfort such as IV fluids, imaging, abx, or lab work.  Will instead focus on symptom management of pain, dyspnea, and agitation in the setting of end-of-life care.  - Code Status: DNR   # Symptom management:  -Pain, in setting of metastatic breast cancer at end of life care   -Increase IV dilaudid continuous infusion to 2mg /hr with IV dilaudid bolus of 4mg  q prn. Continue to titrate based on symptom burden and response.                    -Anxiety/agitation, in the setting of end-of-life care                               -  Continue IV Haldol 1 mg every 4 hours as needed. Continue to adjust based on patient's symptom burden.                   -Secretions, in the setting of end-of-life care                               -Continue IV glycopyrrolate 0.2 mg every 4 hours as needed.  # Psychosocial Support:  -mother, sister, friend   # Discharge Planning: Patient continues with comfort focused care at this time..   Anticipate in-hospital death versus evaluation for inpatient hospice transfer.  Discussed with: patient, RN, hospitalist  Thank you for allowing the palliative care team to participate in the care Deirdre Priest.  Alvester Morin, DO Palliative Care Provider PMT # [phone]  UPDATE: Visited with patient in afternoon around 3:30 PM. Patient's mother present at bedside during visit noting multiple family members have come today to talk with patient. Patient still laying in bed grimacing noting her pain is uncontrolled despite adjustments made earlier in day. Discussed will increase IV dilaudid infusion and bolus dose further which patient and mother agreeing with. Also adding ativan for anxiety as pain is associated with lots of anxiety. May need to schedule medication or further adjust based on patient's symptom response.  Also able to speak with patient's sister, Stanton Kidney, over the phone. Stanton Kidney inquiring about patient's time left. Expressed concern she has hours to days. Discussed continuing multiple medication adjustments for comfort focused knowing patient is dying. At this time, remain in hospital at family's request not to move patient. All questions answered at that time. Discussed care with RN at bedside.   If patient remains symptomatic despite maximum doses, please call PMT at [phone] between 0700 and 1900. Outside of these hours, please call attending, as PMT does not have night coverage.  *Please note that this is a verbal dictation therefore any spelling or grammatical errors are due to the "[employer] One" system interpretation.

## 2023-06-24 NOTE — Plan of Care (Signed)
  Problem: Respiratory: Goal: Ability to maintain adequate ventilation will improve Outcome: Progressing   Problem: Clinical Measurements: Goal: Will remain free from infection Outcome: Progressing Goal: Respiratory complications will improve Outcome: Progressing Goal: Cardiovascular complication will be avoided Outcome: Progressing   Problem: Coping: Goal: Level of anxiety will decrease Outcome: Progressing   Problem: Pain Managment: Goal: General experience of comfort will improve Outcome: Progressing   Problem: Safety: Goal: Ability to remain free from injury will improve Outcome: Progressing   Problem: Education: Goal: Knowledge of the prescribed therapeutic regimen will improve Outcome: Progressing   Problem: Coping: Goal: Ability to identify and develop effective coping behavior will improve Outcome: Progressing   Problem: Respiratory: Goal: Verbalizations of increased ease of respirations will increase Outcome: Progressing   Problem: Role Relationship: Goal: Family's ability to cope with current situation will improve Outcome: Progressing   Problem: Pain Management: Goal: Satisfaction with pain management regimen will improve Outcome: Progressing   Problem: Education: Goal: Knowledge of the prescribed therapeutic regimen will improve Outcome: Progressing

## 2023-06-24 NOTE — Progress Notes (Signed)
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 Found to have a hyprkalemia, hypercalcemia, suspected sepsis in the setting of cellulitis. Oncology following.  Palliative care following.  Appreciate assistance. Now complete comfort care.     Assessment & Plan:   Principal Problem:   Sepsis due to undetermined organism [facility]) Active Problems:   Essential hypertension   Thrombocytosis   Breast cancer metastasized to bone, right (HCC)   Other constipation   GERD (gastroesophageal reflux disease)   Protein-calorie malnutrition, severe   Hypercalcemia of malignancy   Bilateral pulmonary embolism (HCC)   Pressure injury of skin   Carcinomatosis (HCC)   Cellulitis of chest wall   DNR (do not resuscitate)   Sepsis (HCC)   Concern about end of life   Palliative care by specialist  #1 concern for sepsis most likely secondary to cellulitis -She noted to have met criteria for SIRS at time of admission with tachycardia, tachypnea although suspect presentation of these criteria could likely be secondary to PE and patient's SIRS. -Blood cultures with no growth to date. -Respiratory viral panel negative.  Influenza AMB PCR negative.  SARS coronavirus 2 PCR negative.  RSV by PCR negative. -Was placed empirically on IV antibiotics -Patient seen by palliative care and oncology and has been transition to full comfort measures and as such IV antibiotics discontinued.  2.  Bilateral PE -In the setting of malignancy. -Patient was on full dose Lovenox. -Patient has been subsequently transition to full comfort measures and anticoagulation discontinued.  3.   Hypercalcemia of malignancy -Was treated with IV fluids. -Patient with poor prognosis. -Patient seen by oncology and palliative care and has been transition to full comfort measures.  4.  History of breast cancer with extensive metastases/uncontrolled cancer related pain -Patient seen by oncology and palliative care, patient with a poor prognosis with extensive metastases. -Patient transition to full comfort measures. -Patient now on IV Dilaudid drip and dose being adjusted and managed for comfort per palliative care.  5.  Constipation -Senokot-S twice daily. -MiraLAX daily.  6.  Severe protein calorie malnutrition -Patient now transition to full comfort measures.  7.  Stage III medial coccyx ulcer, POA -Seen by wound care, continue current dressing changes. Pressure Injury 06/01/2023 Coccyx Medial Stage 3 -  Full thickness tissue loss. Subcutaneous fat may be visible but bone, tendon or muscle are NOT exposed. (Active)  06/16/2023 1715  Location: Coccyx  Location Orientation: Medial  Staging: Stage 3 -  Full thickness tissue loss. Subcutaneous fat may be visible but bone, tendon or muscle are NOT exposed.  Wound Description (Comments):   Present on Admission: Yes       DVT prophylaxis: Comfort care Code Status: DNR Family Communication: Updated patient.  No family at bedside. Disposition: Likely in-hospital death  Status is: Inpatient Remains inpatient appropriate because: Severity of illness   Consultants:  Palliative care: Dr. Linna Darner 06/22/2023 Wound care Mike Gip, FNP 06/07/2023 Oncology: Dr. Bertis Ruddy  Procedures:  CT angiogram chest 06/12/2023 Chest x-ray 06/10/2023   Antimicrobials:  Anti-infectives (From admission, onward)    Start     Dose/Rate Route Frequency Ordered Stop   06/23/23 1400  cephALEXin (KEFLEX) capsule  500 mg  Status:  Discontinued        500 mg Oral Every 8 hours 06/23/23 1205 06/23/23 1606   06/22/23 1300  vancomycin (VANCOREADY) IVPB 1250 mg/250  mL  Status:  Discontinued        1,250 mg 166.7 mL/hr over 90 Minutes Intravenous Every 24 hours 06/23/2023 1757 06/23/23 1205   06/22/23 1300  cefTRIAXone (ROCEPHIN) 2 g in sodium chloride 0.9 % 100 mL IVPB  Status:  Discontinued        2 g 200 mL/hr over 30 Minutes Intravenous Every 24 hours 06-23-23 1927 06/23/23 1205   Jun 23, 2023 1245  vancomycin (VANCOCIN) IVPB 1000 mg/200 mL premix        1,000 mg 200 mL/hr over 60 Minutes Intravenous  Once 06/23/2023 1230 23-Jun-2023 1608   23-Jun-2023 1245  cefTRIAXone (ROCEPHIN) 2 g in sodium chloride 0.9 % 100 mL IVPB        2 g 200 mL/hr over 30 Minutes Intravenous  Once 06/23/2023 1230 06/23/2023 1501         Subjective: Laying in bed.  States pain medication adjusted per palliative care this morning and seems to be helping with her pain.  Denies any chest pain or shortness of breath.  Asking for some ice chips.  Objective: Vitals:   06/22/23 2354 06/23/23 0407 06/23/23 0806 06/23/23 1231  BP: 112/74 109/72 110/72 98/63  Pulse: (!) 122 (!) 117 (!) 117 (!) 117  Resp: 17 16 20 14   Temp: 98.5 F (36.9 C) 97.6 F (36.4 C) 97.8 F (36.6 C) 98.7 F (37.1 C)  TempSrc: Oral Oral Oral Oral  SpO2: 97% 98% 99% 95%  Weight:        Intake/Output Summary (Last 24 hours) at 06/24/2023 1324 Last data filed at 06/24/2023 0644 Gross per 24 hour  Intake 738.77 ml  Output 500 ml  Net 238.77 ml   Filed Weights   2023-06-23 1628  Weight: 64.2 kg    Examination:  General exam: Frail.  Cachectic.  Emaciated. Respiratory system: Clear to auscultation anterior lung fields.Marland Kitchen Respiratory effort normal. Cardiovascular system: S1 & S2 heard, RRR. No JVD, murmurs, rubs, gallops or clicks. No pedal edema. Gastrointestinal system: Abdomen is nondistended, soft and nontender. No organomegaly or masses felt. Normal bowel sounds heard. Central nervous system: Alert and oriented. No focal neurological deficits. Extremities: Symmetric 5 x 5 power. Skin: No rashes, lesions  or ulcers Psychiatry: Judgement and insight appear normal. Mood & affect appropriate.     Data Reviewed: I have personally reviewed following labs and imaging studies  CBC: Recent Labs  Lab 06/23/23 1216 06/22/23 0450 06/23/23 0551  WBC 7.3 8.1 8.2  NEUTROABS 5.1 5.7 5.6  HGB 12.1 12.0 12.6  HCT 40.0 40.4 42.4  MCV 90.9 90.6 90.4  PLT 423* 381 390    Basic Metabolic Panel: Recent Labs  Lab 06/23/23 1216 06/22/23 0450 06/22/23 1442 06/23/23 0551  NA 137 134* 139 136  K 4.9 4.7 5.6* 4.3  CL 104 100 104 105  CO2 26 25 26 22   GLUCOSE 102* 78 79 86  BUN 45* 36* 36* 31*  CREATININE 0.82 0.73 0.68 0.80  CALCIUM 12.9* 12.4* 11.9* 11.3*    GFR: Estimated Creatinine Clearance: 67.4 mL/min (by C-G formula based on SCr of 0.8 mg/dL).  Liver Function Tests: Recent Labs  Lab 06/23/2023 1216 06/22/23 0450 06/23/23 0551  AST 56* 53* 59*  ALT 9 10 10   ALKPHOS 88 84 84  BILITOT 0.7 0.7  0.5  PROT 6.6 6.3* 6.3*  ALBUMIN 2.2* 2.1* 2.1*    CBG: No results for input(s): "GLUCAP" in the last 168 hours.   Recent Results (from the past 240 hour(s))  Blood Culture (routine x 2)     Status: None (Preliminary result)   Collection Time: Jul 02, 2023 12:16 PM   Specimen: Site Not Specified; Blood  Result Value Ref Range Status   Specimen Description   Final    SITE NOT SPECIFIED Performed at [facility], [address] [place], [place] [address]    Special Requests   Final    BOTTLES DRAWN AEROBIC AND ANAEROBIC Blood Culture adequate volume Performed at [facility], [address] [place], [place] [address]    Culture   Final    NO GROWTH 3 DAYS Performed at [facility], [address] [place], [place] [address]    Report Status PENDING  Incomplete  Blood Culture (routine x 2)     Status: None (Preliminary result)   Collection Time: 07-02-2023 12:35 PM   Specimen: BLOOD LEFT HAND  Result Value Ref Range Status   Specimen  Description   Final    BLOOD LEFT HAND Performed at [facility], [address] [place], [place] [address]    Special Requests   Final    BOTTLES DRAWN AEROBIC ONLY Blood Culture adequate volume Performed at [facility], [address] [place], [place] [address]    Culture   Final    NO GROWTH 3 DAYS Performed at [facility], [address], [place], [place] [address]    Report Status PENDING  Incomplete  Resp panel by RT-PCR (RSV, Flu A&B, Covid) Anterior Nasal Swab     Status: None   Collection Time: Jul 02, 2023  1:15 PM   Specimen: Anterior Nasal Swab  Result Value Ref Range Status   SARS Coronavirus 2 by RT PCR NEGATIVE NEGATIVE Final    Comment: (NOTE) SARS-CoV-2 target nucleic acids are NOT DETECTED.  The SARS-CoV-2 RNA is generally detectable in upper respiratory specimens during the acute phase of infection. The lowest concentration of SARS-CoV-2 viral copies this assay can detect is 138 copies/mL. A negative result does not preclude SARS-Cov-2 infection and should not be used as the sole basis for treatment or other patient management decisions. A negative result may occur with  improper specimen collection/handling, submission of specimen other than nasopharyngeal swab, presence of viral mutation(s) within the areas targeted by this assay, and inadequate number of viral copies(<138 copies/mL). A negative result must be combined with clinical observations, patient history, and epidemiological information. The expected result is Negative.  Fact Sheet for Patients:  BloggerCourse.com  Fact Sheet for Healthcare Providers:  SeriousBroker.it  This test is no t yet approved or cleared by the [place] FDA and  has been authorized for detection and/or diagnosis of SARS-CoV-2 by FDA under an Emergency Use Authorization (EUA). This EUA will remain  in effect (meaning this test  can be used) for the duration of the COVID-19 declaration under Section 564(b)(1) of the Act, 21 U.S.C.section 360bbb-3(b)(1), unless the authorization is terminated  or revoked sooner.       Influenza A by PCR NEGATIVE NEGATIVE Final   Influenza B by PCR NEGATIVE NEGATIVE Final    Comment: (NOTE) The Xpert Xpress SARS-CoV-2/FLU/RSV plus assay is intended as an aid in the diagnosis of influenza from Nasopharyngeal swab specimens and should not be used as  a sole basis for treatment. Nasal washings and aspirates are unacceptable for Xpert Xpress SARS-CoV-2/FLU/RSV testing.  Fact Sheet for Patients: BloggerCourse.com  Fact Sheet for Healthcare Providers: SeriousBroker.it  This test is not yet approved or cleared by the [place] FDA and has been authorized for detection and/or diagnosis of SARS-CoV-2 by FDA under an Emergency Use Authorization (EUA). This EUA will remain in effect (meaning this test can be used) for the duration of the COVID-19 declaration under Section 564(b)(1) of the Act, 21 U.S.C. section 360bbb-3(b)(1), unless the authorization is terminated or revoked.     Resp Syncytial Virus by PCR NEGATIVE NEGATIVE Final    Comment: (NOTE) Fact Sheet for Patients: BloggerCourse.com  Fact Sheet for Healthcare Providers: SeriousBroker.it  This test is not yet approved or cleared by the [place] FDA and has been authorized for detection and/or diagnosis of SARS-CoV-2 by FDA under an Emergency Use Authorization (EUA). This EUA will remain in effect (meaning this test can be used) for the duration of the COVID-19 declaration under Section 564(b)(1) of the Act, 21 U.S.C. section 360bbb-3(b)(1), unless the authorization is terminated or revoked.  Performed at [facility], [address], [place], [place] [address]          Radiology  Studies: No results found.      Scheduled Meds:  mouth rinse  15 mL Mouth Rinse 4 times per day   Continuous Infusions:  HYDROmorphone 2 mg/hr (06/24/23 0947)     LOS: 3 days    Time spent: 35 minutes    Ramiro Harvest, MD [facility]   To contact the attending provider between 7A-7P or the covering provider during after hours 7P-7A, please log into the web site [email or web address] and access using universal [place] password for that web site. If you do not have the password, please call the hospital operator.  06/24/2023, 1:24 PM

## 2023-06-24 NOTE — TOC Progression Note (Addendum)
Transition of Care [facility]) - Progression Note    Patient Details  Name: [name] MRN: [id number] Date of Birth: 04/14/1961  Transition of [facility]) CM/SW Contact  Howell Rucks, RN Phone Number: 06/24/2023, 11:26 AM  Clinical Narrative:   Comfort Care. Anticipate in-hospital death versus evaluation for inpatient hospice transfer . TOC will continue to follow.           Expected Discharge Plan and Services                                               Social Determinants of Health (SDOH) Interventions SDOH Screenings   Food Insecurity: No Food Insecurity (06/05/2023)  Housing: Low Risk  (06/09/2023)  Transportation Needs: No Transportation Needs (06/16/2023)  Utilities: Not At Risk (06/18/2023)  Financial Resource Strain: Low Risk  (04/03/2022)  Tobacco Use: Low Risk  (06/23/2023)    Readmission Risk Interventions    08/30/2022    1:46 PM 08/06/2022   10:01 AM  Readmission Risk Prevention Plan  Transportation Screening Complete   PCP or Specialist Appt within 3-5 Days Complete   HRI or Home Care Consult Complete   Social Work Consult for Recovery Care Planning/Counseling Complete   Palliative Care Screening Not Applicable   Medication Review Oceanographer) Complete Complete  PCP or Specialist appointment within 3-5 days of discharge  Complete  HRI or Home Care Consult  Complete  SW Recovery Care/Counseling Consult  Complete  Palliative Care Screening  Not Applicable  Skilled Nursing Facility  Complete

## 2023-06-25 DIAGNOSIS — G893 Neoplasm related pain (acute) (chronic): Secondary | ICD-10-CM | POA: Diagnosis not present

## 2023-06-25 DIAGNOSIS — Z515 Encounter for palliative care: Secondary | ICD-10-CM | POA: Diagnosis not present

## 2023-06-25 DIAGNOSIS — R451 Restlessness and agitation: Secondary | ICD-10-CM

## 2023-06-25 DIAGNOSIS — I2699 Other pulmonary embolism without acute cor pulmonale: Secondary | ICD-10-CM | POA: Diagnosis not present

## 2023-06-25 DIAGNOSIS — K5909 Other constipation: Secondary | ICD-10-CM | POA: Diagnosis not present

## 2023-06-25 DIAGNOSIS — L03313 Cellulitis of chest wall: Secondary | ICD-10-CM | POA: Diagnosis not present

## 2023-06-25 DIAGNOSIS — Z79899 Other long term (current) drug therapy: Secondary | ICD-10-CM

## 2023-06-25 DIAGNOSIS — A419 Sepsis, unspecified organism: Secondary | ICD-10-CM | POA: Diagnosis not present

## 2023-06-25 DIAGNOSIS — I10 Essential (primary) hypertension: Secondary | ICD-10-CM

## 2023-06-25 LAB — CULTURE, BLOOD (ROUTINE X 2)
Culture: NO GROWTH
Culture: NO GROWTH
Special Requests: ADEQUATE
Special Requests: ADEQUATE

## 2023-06-25 MED ORDER — LORAZEPAM 2 MG/ML IJ SOLN
INTRAMUSCULAR | Status: AC
  Administered 2023-06-25: 2 mg via INTRAVENOUS
  Filled 2023-06-25: qty 1

## 2023-06-25 MED ORDER — LORAZEPAM 2 MG/ML IJ SOLN
1.0000 mg | INTRAMUSCULAR | Status: DC | PRN
  Administered 2023-06-25: 1 mg via INTRAVENOUS
  Filled 2023-06-25: qty 1

## 2023-06-25 MED ORDER — LORAZEPAM 2 MG/ML IJ SOLN: 1.0000 mg | INTRAMUSCULAR | Status: DC | PRN

## 2023-06-25 NOTE — Progress Notes (Signed)
Daily Progress Note   Patient Name: [name]       Date: 06/25/2023 DOB: 1961/01/24  Age: 62 y.o. MRN#: [id number] Attending Physician: Rodolph Bong, MD Primary Care Physician: Artis Delay, MD Admit Date: 06/16/2023 Length of Stay: 4 days  Reason for Consultation/Follow-up: Establishing goals of care  Subjective:   CC: Patient's pain worsened overnight. Following up regarding complex medical decision making and symptom management.   Subjective:  Reviewed EMR prior to presenting to bedside.  At time of EMR review patient has continued to receive IV Dilaudid continuous infusion a4 mg/hr with bolus dosing of 5mg  q88mins prn x1 in the past 24 hours.  Discussed care with RN multiple times throughout the day to update on patient's care.   Presented to bedside to check on patient. No family present at bedside. Patient laying in bed comfortably, unresponsive. Patient RR noted to be 3 per minute with multiples periods of apnea. Spent time at bedside talking with patient to provide company at the end of life. Stayed with patient until chaplain was able to come to bedside to visit as well.   Presented to bedside later in afternoon as informed patient's sister, Stanton Kidney, was present. Able to discuss patient's continued comfort focused care with her. Reviewed physical signs that patient still has hours to days. Stanton Kidney inquired if hospice had been contacted. Expressed that I had spoken to the hospice liaison for [facility] directly past two days though did noto pursue it further when Debra specifically stated yesterday when we talked that "there wouldn't be a point in transferring her now since there is only a little time". Expressed that at this point even if inpatient hospice transfer wanted to be pursued, patient would likely die in transport so she will remain here with in hospital death anticipated unless something changes where it is believed she is stable enough for transfer. Will continue to  provide care  and support of dignity at the end of life. All questions answered at that time. Noted palliative medicine team will continue to following along with patient's medical journey.   Informed early in afternoon that patient having worsening agitation with moaning and crying in pain. Will adjust patient's ativan dosing since patient has responded well to this previous for agitation management. Patient had previously stated multiple times that her priority is to be comfortable and not in pain at the end of her life.   Review of Systems Pain not controlled Objective:   Vital Signs:  BP 98/63 (BP Location: Left Arm)   Pulse (!) 117   Temp 98.7 F (37.1 C) (Oral)   Resp (!) 6   Wt 64.2 kg   SpO2 95%   BMI 22.84 kg/m   Physical Exam: General: Ill-appearing, extremely cachectic and frail, laying in bed, unresponsive Eyes: No drainage noted HENT: dry mucous membranes Cardiovascular: brachycardia noted Respiratory: no increased work of breathing noted, not in respiratory distress Skin: Chronic nonhealing wound on right side of chest going to back Neuro: unresponsive   Imaging: I personally reviewed recent imaging.   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  Oncologist, Dr. Bertis Ruddy, involved to assist with complex medical decision making.  Recommendations/Plan: # Complex medical decision making/goals of care:  -Patient and family agreed with transition to full comfort focused care on 06/23/2023. Initially contact [facility] liaison regarding [facility] transfer at family's request on 9/24 though family then  refused evaluation and transfer on 9/25 due to patient's time being hours to days. Discussed with family today that based on examination, patient now too unstable for inpatient hospice transfer.   -Have discontinued interventions that are no longer focused on comfort such as IV fluids, imaging, abx, or lab work.  Will instead focus on symptom management of pain, dyspnea, and agitation in the setting of end-of-life care.  - Code Status: DNR   # Symptom management:  -Pain, in setting of metastatic breast cancer at end of life care   -Continue IV dilaudid continuous infusion to 2mg /hr with IV dilaudid bolus of 4mg  q prn. Continue to titrate based on symptom burden and response.                    -Anxiety/agitation, in the setting of end-of-life care                               -Continue IV Haldol 1 mg every 4 hours as needed. Continue to adjust based on patient's symptom burden.    -Increase IV Ativan to 1-2mg  q2 hrs prn due to patient's worsening agitation this afternoon                   -Secretions, in the setting of end-of-life care                               -Continue IV glycopyrrolate 0.2 mg every 4 hours as needed.  # Psychosocial Support:  -mother, sister,  # Discharge Planning: Patient continues with comfort focused care at this time.  Anticipate in-hospital death based on examination as now appears too unstable for inpatient hospice transfer.   Discussed with: patient, RN, hospitalist  Thank you for allowing the palliative care team to participate in the care Deirdre Priest.  Alvester Morin, DO Palliative Care Provider PMT # [phone]  If patient remains symptomatic despite maximum doses, please call PMT at [phone] between 0700 and 1900. Outside of these hours, please call attending, as PMT does not have night coverage.  *Please note that this is a verbal dictation therefore any spelling or grammatical errors are due to the "[employer]" system  interpretation.

## 2023-06-25 NOTE — Progress Notes (Signed)
Chaplain engaged in an initial visit with Khepri. Chaplain was referred by Palliative Care as Zehava had previously mentioned her desire not to be alone or die alone. Chaplain sat at the bedside with [place] reciting scripture and playing music. Chaplain provided compassionate presence, the ministry of touch through rubbing Shacoya's hand, and talking with Tonimarie to remind her of presence in the room.     06/25/23 1400  Spiritual Encounters  Type of Visit Initial  Care provided to: Patient  Reason for visit End-of-life  Interventions  Spiritual Care Interventions Made Prayer;Compassionate presence

## 2023-06-25 NOTE — Progress Notes (Signed)
PROGRESS NOTE    [name]  ZOX:[id number] DOB: 26-Apr-1961 DOA: 06/11/2023 PCP: Artis Delay, MD    Chief Complaint  Patient presents with   Failure To Thrive    Brief Narrative:  PMH of HTN, GERD, constipation, breast cancer with stage IV metastasis with carcinomatosis, severe protein-calorie malnutrition present to the hospital with failure to thrive from nursing facility. Found to have a hyprkalemia, hypercalcemia, suspected sepsis in the setting of cellulitis. Oncology following.  Palliative care following.  Appreciate assistance. Now complete comfort care.     Assessment & Plan:   Principal Problem:   Sepsis due to undetermined organism [facility]) Active Problems:   Essential hypertension   Thrombocytosis   Breast cancer metastasized to bone, right (HCC)   Other constipation   GERD (gastroesophageal reflux disease)   Protein-calorie malnutrition, severe   Hypercalcemia of malignancy   Bilateral pulmonary embolism (HCC)   Pressure injury of skin   Carcinomatosis (HCC)   Cellulitis of chest wall   DNR (do not resuscitate)   Sepsis (HCC)   Concern about end of life   Palliative care by specialist  #1 concern for sepsis most likely secondary to cellulitis -She noted to have met criteria for SIRS at time of admission with tachycardia, tachypnea although suspect presentation of these criteria could likely be secondary to PE and patient's SIRS. -Blood cultures with no growth to date. -Respiratory viral panel negative.  Influenza A and B PCR negative.  SARS coronavirus 2 PCR negative.  RSV by PCR negative. -Was placed empirically on IV antibiotics -Patient seen by palliative care and oncology and has been transitioned to full comfort measures and as such IV antibiotics discontinued.  2.  Bilateral PE -In the setting of malignancy. -Patient was on full dose Lovenox. -Patient has been transition to full comfort measures and anticoagulation discontinued.    3.   Hypercalcemia of malignancy -Was treated with IV fluids. -Patient with poor prognosis. -Patient seen by oncology and palliative care and has been transitioned to full comfort measures.  4.  History of breast cancer with extensive metastases/uncontrolled cancer related pain -Patient seen by oncology and palliative care, patient with a poor prognosis with extensive metastases. -Patient transitioned to full comfort measures. -Patient now on IV Dilaudid drip and dose adjusted/increased yesterday by palliative care at patient request.  Patient currently resting comfortably.   -Patient with some agonal breaths.   5.  Constipation -Senokot-S twice daily. -MiraLAX daily.  6.  Severe protein calorie malnutrition -Patient now transitioned to full comfort measures.  7.  Stage III medial coccyx ulcer, POA -Seen by wound care, continue current dressing changes. Pressure Injury 06/05/2023 Coccyx Medial Stage 3 -  Full thickness tissue loss. Subcutaneous fat may be visible but bone, tendon or muscle are NOT exposed. (Active)  06/08/2023 1715  Location: Coccyx  Location Orientation: Medial  Staging: Stage 3 -  Full thickness tissue loss. Subcutaneous fat may be visible but bone, tendon or muscle are NOT exposed.  Wound Description (Comments):   Present on Admission: Yes       DVT prophylaxis: Comfort care Code Status: DNR Family Communication: Updated patient.  No family at bedside. Disposition: Likely in-hospital death  Status is: Inpatient Remains inpatient appropriate because: Severity of illness   Consultants:  Palliative care: Dr. Linna Darner 06/22/2023 Wound care Mike Gip, FNP 06/27/2023 Oncology: Dr. Bertis Ruddy  Procedures:  CT angiogram chest 06/24/2023 Chest x-ray 06/07/2023   Antimicrobials:  Anti-infectives (From admission, onward)    Start  PROGRESS NOTE    [name]  ZOX:[id number] DOB: 26-Apr-1961 DOA: 06/22/2023 PCP: Artis Delay, MD    Chief Complaint  Patient presents with   Failure To Thrive    Brief Narrative:  PMH of HTN, GERD, constipation, breast cancer with stage IV metastasis with carcinomatosis, severe protein-calorie malnutrition present to the hospital with failure to thrive from nursing facility. Found to have a hyprkalemia, hypercalcemia, suspected sepsis in the setting of cellulitis. Oncology following.  Palliative care following.  Appreciate assistance. Now complete comfort care.     Assessment & Plan:   Principal Problem:   Sepsis due to undetermined organism [facility]) Active Problems:   Essential hypertension   Thrombocytosis   Breast cancer metastasized to bone, right (HCC)   Other constipation   GERD (gastroesophageal reflux disease)   Protein-calorie malnutrition, severe   Hypercalcemia of malignancy   Bilateral pulmonary embolism (HCC)   Pressure injury of skin   Carcinomatosis (HCC)   Cellulitis of chest wall   DNR (do not resuscitate)   Sepsis (HCC)   Concern about end of life   Palliative care by specialist  #1 concern for sepsis most likely secondary to cellulitis -She noted to have met criteria for SIRS at time of admission with tachycardia, tachypnea although suspect presentation of these criteria could likely be secondary to PE and patient's SIRS. -Blood cultures with no growth to date. -Respiratory viral panel negative.  Influenza A and B PCR negative.  SARS coronavirus 2 PCR negative.  RSV by PCR negative. -Was placed empirically on IV antibiotics -Patient seen by palliative care and oncology and has been transitioned to full comfort measures and as such IV antibiotics discontinued.  2.  Bilateral PE -In the setting of malignancy. -Patient was on full dose Lovenox. -Patient has been transition to full comfort measures and anticoagulation discontinued.    3.   Hypercalcemia of malignancy -Was treated with IV fluids. -Patient with poor prognosis. -Patient seen by oncology and palliative care and has been transitioned to full comfort measures.  4.  History of breast cancer with extensive metastases/uncontrolled cancer related pain -Patient seen by oncology and palliative care, patient with a poor prognosis with extensive metastases. -Patient transitioned to full comfort measures. -Patient now on IV Dilaudid drip and dose adjusted/increased yesterday by palliative care at patient request.  Patient currently resting comfortably.   -Patient with some agonal breaths.   5.  Constipation -Senokot-S twice daily. -MiraLAX daily.  6.  Severe protein calorie malnutrition -Patient now transitioned to full comfort measures.  7.  Stage III medial coccyx ulcer, POA -Seen by wound care, continue current dressing changes. Pressure Injury 06/13/2023 Coccyx Medial Stage 3 -  Full thickness tissue loss. Subcutaneous fat may be visible but bone, tendon or muscle are NOT exposed. (Active)  05/31/2023 1715  Location: Coccyx  Location Orientation: Medial  Staging: Stage 3 -  Full thickness tissue loss. Subcutaneous fat may be visible but bone, tendon or muscle are NOT exposed.  Wound Description (Comments):   Present on Admission: Yes       DVT prophylaxis: Comfort care Code Status: DNR Family Communication: Updated patient.  No family at bedside. Disposition: Likely in-hospital death  Status is: Inpatient Remains inpatient appropriate because: Severity of illness   Consultants:  Palliative care: Dr. Linna Darner 06/22/2023 Wound care Mike Gip, FNP 06/19/2023 Oncology: Dr. Bertis Ruddy  Procedures:  CT angiogram chest 06/18/2023 Chest x-ray 06/03/2023   Antimicrobials:  Anti-infectives (From admission, onward)    Start  PROGRESS NOTE    [name]  ZOX:[id number] DOB: 26-Apr-1961 DOA: 06/22/2023 PCP: Artis Delay, MD    Chief Complaint  Patient presents with   Failure To Thrive    Brief Narrative:  PMH of HTN, GERD, constipation, breast cancer with stage IV metastasis with carcinomatosis, severe protein-calorie malnutrition present to the hospital with failure to thrive from nursing facility. Found to have a hyprkalemia, hypercalcemia, suspected sepsis in the setting of cellulitis. Oncology following.  Palliative care following.  Appreciate assistance. Now complete comfort care.     Assessment & Plan:   Principal Problem:   Sepsis due to undetermined organism [facility]) Active Problems:   Essential hypertension   Thrombocytosis   Breast cancer metastasized to bone, right (HCC)   Other constipation   GERD (gastroesophageal reflux disease)   Protein-calorie malnutrition, severe   Hypercalcemia of malignancy   Bilateral pulmonary embolism (HCC)   Pressure injury of skin   Carcinomatosis (HCC)   Cellulitis of chest wall   DNR (do not resuscitate)   Sepsis (HCC)   Concern about end of life   Palliative care by specialist  #1 concern for sepsis most likely secondary to cellulitis -She noted to have met criteria for SIRS at time of admission with tachycardia, tachypnea although suspect presentation of these criteria could likely be secondary to PE and patient's SIRS. -Blood cultures with no growth to date. -Respiratory viral panel negative.  Influenza A and B PCR negative.  SARS coronavirus 2 PCR negative.  RSV by PCR negative. -Was placed empirically on IV antibiotics -Patient seen by palliative care and oncology and has been transitioned to full comfort measures and as such IV antibiotics discontinued.  2.  Bilateral PE -In the setting of malignancy. -Patient was on full dose Lovenox. -Patient has been transition to full comfort measures and anticoagulation discontinued.    3.   Hypercalcemia of malignancy -Was treated with IV fluids. -Patient with poor prognosis. -Patient seen by oncology and palliative care and has been transitioned to full comfort measures.  4.  History of breast cancer with extensive metastases/uncontrolled cancer related pain -Patient seen by oncology and palliative care, patient with a poor prognosis with extensive metastases. -Patient transitioned to full comfort measures. -Patient now on IV Dilaudid drip and dose adjusted/increased yesterday by palliative care at patient request.  Patient currently resting comfortably.   -Patient with some agonal breaths.   5.  Constipation -Senokot-S twice daily. -MiraLAX daily.  6.  Severe protein calorie malnutrition -Patient now transitioned to full comfort measures.  7.  Stage III medial coccyx ulcer, POA -Seen by wound care, continue current dressing changes. Pressure Injury 06/13/2023 Coccyx Medial Stage 3 -  Full thickness tissue loss. Subcutaneous fat may be visible but bone, tendon or muscle are NOT exposed. (Active)  05/31/2023 1715  Location: Coccyx  Location Orientation: Medial  Staging: Stage 3 -  Full thickness tissue loss. Subcutaneous fat may be visible but bone, tendon or muscle are NOT exposed.  Wound Description (Comments):   Present on Admission: Yes       DVT prophylaxis: Comfort care Code Status: DNR Family Communication: Updated patient.  No family at bedside. Disposition: Likely in-hospital death  Status is: Inpatient Remains inpatient appropriate because: Severity of illness   Consultants:  Palliative care: Dr. Linna Darner 06/22/2023 Wound care Mike Gip, FNP 06/19/2023 Oncology: Dr. Bertis Ruddy  Procedures:  CT angiogram chest 06/18/2023 Chest x-ray 06/03/2023   Antimicrobials:  Anti-infectives (From admission, onward)    Start  PROGRESS NOTE    [name]  ZOX:[id number] DOB: 26-Apr-1961 DOA: 06/22/2023 PCP: Artis Delay, MD    Chief Complaint  Patient presents with   Failure To Thrive    Brief Narrative:  PMH of HTN, GERD, constipation, breast cancer with stage IV metastasis with carcinomatosis, severe protein-calorie malnutrition present to the hospital with failure to thrive from nursing facility. Found to have a hyprkalemia, hypercalcemia, suspected sepsis in the setting of cellulitis. Oncology following.  Palliative care following.  Appreciate assistance. Now complete comfort care.     Assessment & Plan:   Principal Problem:   Sepsis due to undetermined organism [facility]) Active Problems:   Essential hypertension   Thrombocytosis   Breast cancer metastasized to bone, right (HCC)   Other constipation   GERD (gastroesophageal reflux disease)   Protein-calorie malnutrition, severe   Hypercalcemia of malignancy   Bilateral pulmonary embolism (HCC)   Pressure injury of skin   Carcinomatosis (HCC)   Cellulitis of chest wall   DNR (do not resuscitate)   Sepsis (HCC)   Concern about end of life   Palliative care by specialist  #1 concern for sepsis most likely secondary to cellulitis -She noted to have met criteria for SIRS at time of admission with tachycardia, tachypnea although suspect presentation of these criteria could likely be secondary to PE and patient's SIRS. -Blood cultures with no growth to date. -Respiratory viral panel negative.  Influenza A and B PCR negative.  SARS coronavirus 2 PCR negative.  RSV by PCR negative. -Was placed empirically on IV antibiotics -Patient seen by palliative care and oncology and has been transitioned to full comfort measures and as such IV antibiotics discontinued.  2.  Bilateral PE -In the setting of malignancy. -Patient was on full dose Lovenox. -Patient has been transition to full comfort measures and anticoagulation discontinued.    3.   Hypercalcemia of malignancy -Was treated with IV fluids. -Patient with poor prognosis. -Patient seen by oncology and palliative care and has been transitioned to full comfort measures.  4.  History of breast cancer with extensive metastases/uncontrolled cancer related pain -Patient seen by oncology and palliative care, patient with a poor prognosis with extensive metastases. -Patient transitioned to full comfort measures. -Patient now on IV Dilaudid drip and dose adjusted/increased yesterday by palliative care at patient request.  Patient currently resting comfortably.   -Patient with some agonal breaths.   5.  Constipation -Senokot-S twice daily. -MiraLAX daily.  6.  Severe protein calorie malnutrition -Patient now transitioned to full comfort measures.  7.  Stage III medial coccyx ulcer, POA -Seen by wound care, continue current dressing changes. Pressure Injury 06/13/2023 Coccyx Medial Stage 3 -  Full thickness tissue loss. Subcutaneous fat may be visible but bone, tendon or muscle are NOT exposed. (Active)  05/31/2023 1715  Location: Coccyx  Location Orientation: Medial  Staging: Stage 3 -  Full thickness tissue loss. Subcutaneous fat may be visible but bone, tendon or muscle are NOT exposed.  Wound Description (Comments):   Present on Admission: Yes       DVT prophylaxis: Comfort care Code Status: DNR Family Communication: Updated patient.  No family at bedside. Disposition: Likely in-hospital death  Status is: Inpatient Remains inpatient appropriate because: Severity of illness   Consultants:  Palliative care: Dr. Linna Darner 06/22/2023 Wound care Mike Gip, FNP 06/19/2023 Oncology: Dr. Bertis Ruddy  Procedures:  CT angiogram chest 06/18/2023 Chest x-ray 06/03/2023   Antimicrobials:  Anti-infectives (From admission, onward)    Start

## 2023-06-26 ENCOUNTER — Ambulatory Visit: Payer: BC Managed Care – PPO | Admitting: Hematology and Oncology

## 2023-06-26 ENCOUNTER — Other Ambulatory Visit: Payer: BC Managed Care – PPO

## 2023-06-29 ENCOUNTER — Other Ambulatory Visit: Payer: Self-pay

## 2023-06-30 NOTE — Death Summary Note (Signed)
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  Hospital Diagnoses: Principal Problem:   Sepsis due to undetermined organism [facility]) Active Problems:   Essential hypertension   Thrombocytosis   Breast cancer metastasized to bone, right (HCC)   Other constipation   GERD (gastroesophageal reflux disease)   Protein-calorie malnutrition, severe   Hypercalcemia of malignancy   Bilateral pulmonary embolism (HCC)   Pressure injury of skin   Carcinomatosis (HCC)   Cellulitis of chest wall   DNR (do not resuscitate)   Sepsis (HCC)   Concern about end of life   Palliative care by specialist   Agitation   HPI per Dr. Rennis Chris is a 62 y.o. 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  She has right-sided chest wall pain from her open wounds but no precordial/deep discomfort.  She had been very dyspneic.  She denied fever, chills, rhinorrhea, sore throat, wheezing or hemoptysis.  No palpitations, diaphoresis, PND or orthopnea.  Decreased appetite, frequent constipation, no current abdominal pain, nausea, emesis, diarrhea, melena or hematochezia.  No flank pain, dysuria, frequency or hematuria.  No polyuria, polydipsia, polyphagia or blurred vision.    Lab work: CBC showed white count 7.3, hemoglobin 12.1 g/dL platelets 829.  Normal  PT, INR and PTT.  CMP showed normal electrolytes except for calcium of 12.9 mg/dL.  Albumin was 2.2 g/deciliter, AST 56 units/L, glucose 102, BUN 45 and creatinine 0.82 mg/dL.  Coronavirus, influenza and RSV PCR was negative.   Imaging: Portable 1 view chest radiograph showed stable small layering right pleural effusion and right lower lobe lung infiltrates or atelectasis.  CTA chest showed multifocal bilateral pulmonary emboli extending from distal lobar arteries into all lobes.  Evidence of right heart strain.  New groundglass opacity in the medial left apex for peribronchial left lower lobe, which or secondary to pulmonary unchanged right pleural effusion with a look to continue progression of cutaneous thickening no drainable fluid collection.  No gaseous component.  Right hilar lymphadenopathy.  Unchanged sclerotic appearance of the sternum and right anterior a reft, suspicious for osseous metastasis.  Increased diffuse body wall edema.   ED course: Initial vital signs were temperature 97.7 F, pulse 120, respirations 12, BP 112/65 mmHg O2 sat 90% on room air.  The patient received 1000 mg of vancomycin, ceftriaxone 2 g IVPB, fentanyl 100 mcg IVP and 2250 mL of LR bolus.    Hospital Course: #1 concern for sepsis most likely secondary to cellulitis, sepsis ruled out. -She noted to have met criteria for SIRS at time of admission with tachycardia, tachypnea although suspect presentation of these criteria could likely be secondary to PE and patient's SIRS. -Blood cultures drawn were negative.  -Respiratory viral panel negative.  Influenza A and B PCR negative.  SARS coronavirus 2 PCR negative.  RSV by PCR negative. -Was placed empirically on IV antibiotics -Patient seen by palliative care and  oncology and transitioned to full comfort measures and as such IV antibiotics discontinued. -Patient was kept comfortable and subsequently died at 3:47 AM on 2023-07-14. -May her soul rest in peace.   2.   Bilateral PE -In the setting of malignancy. -Patient was on full dose Lovenox. -Patient seen by oncology and palliative care and subsequently transitioned to full comfort measures and anticoagulation discontinued.   -Patient was kept comfortable. -Patient subsequently died at 3:47 AM on 07-14-23.    3.  Hypercalcemia of malignancy -Was treated with IV fluids. -Patient with poor prognosis. -Patient seen by oncology and palliative care and transitioned to full comfort measures.   4.  History of breast cancer with extensive metastases/uncontrolled cancer related pain -Patient seen by oncology and palliative care, patient with a poor prognosis with extensive metastases. -Patient transitioned to full comfort measures. -Patient was kept comfortable, placed on IV Dilaudid drip and followed by palliative care throughout the hospitalization and dose adjusted for comfort.  -Patient was kept comfortable and subsequently died at 3:47 AM on 2023/07/14.   May her soul rest in peace.    5.  Constipation -Patient maintained on a bowel regimen.    6.  Severe protein calorie malnutrition -Patient transitioned to full comfort measures.  -Patient was kept comfortable and subsequently died at 3:47 AM on 2023/07/14.    7.  Stage III medial coccyx ulcer, POA -Seen by wound care, and dressing changes recommended.   -Patient subsequently was transitioned to full comfort measures.  Pressure Injury 07-09-2023 Coccyx Medial Stage 3 -  Full thickness tissue loss. Subcutaneous fat may be visible but bone, tendon or muscle are NOT exposed. (Active)  Jul 09, 2023 1715  Location: Coccyx  Location Orientation: Medial  Staging: Stage 3 -  Full thickness tissue loss. Subcutaneous fat may be visible but bone, tendon or muscle are NOT exposed.  Wound Description (Comments):   Present on Admission: Yes     Procedures:  CT angiogram chest Jul 09, 2023 Chest x-ray 2023/07/09    Consultations:  Palliative care: Dr. Linna Darner  06/22/2023 Wound care Mike Gip, FNP 09-Jul-2023 Oncology: Dr. Bertis Ruddy  The results of significant diagnostics from this hospitalization (including imaging, microbiology, ancillary and laboratory) are listed below for reference.   Significant Diagnostic Studies: CT Angio Chest Pulmonary Embolism (PE) W or WO Contrast  Result Date: 07/09/2023 CLINICAL DATA:  History of breast cancer with chronic chest wound and one-week history of increasing weakness. Tachycardic. EXAM: CT ANGIOGRAPHY CHEST WITH CONTRAST TECHNIQUE: Multidetector CT imaging of the chest was performed using the standard protocol during bolus administration of intravenous contrast. Multiplanar CT image reconstructions and MIPs were obtained to evaluate the vascular anatomy. RADIATION DOSE REDUCTION: This exam was performed according to the departmental dose-optimization program which includes automated exposure control, adjustment of the mA and/or kV according to patient size and/or use of iterative reconstruction technique. CONTRAST:  75mL OMNIPAQUE IOHEXOL 350 MG/ML SOLN COMPARISON:  CTA chest dated 01/09/2023 FINDINGS: Cardiovascular: The study is high quality for the evaluation of pulmonary embolism. Multifocal bilateral pulmonary emboli extending from distal lobar arteries into all lobes. Great vessels are normal in course and caliber. RV: LV ratio is greater than 1. No significant pericardial fluid/thickening. Mediastinum/Nodes: Imaged thyroid gland without nodules meeting criteria for imaging follow-up by size. Normal esophagus. Right hilar lymph node measures 1.0 cm (4:90), previously 0.7 cm. Lungs/Pleura: The central airways are patent. Again seen is volume loss with architectural distortion and scarring in the inferior right lower lung with subsegmental atelectasis in  the right lower lobe. Irregular right apical nodule measures 10 x 4 mm (12:37), not substantially changed when remeasured. Unchanged 4 mm medial right lower lobe nodule  (12:80). Additional scattered nodules, including left apical irregular nodule measuring 5 x 3 mm (12:38), are also unchanged. New ground-glass opacity in the medial left apex (12:44) and peribronchovascular left lower lobe. No pneumothorax. Unchanged right pleural effusion with a loculated component. Upper abdomen: Normal. Musculoskeletal: Similar sclerotic appearance of the sternum and right anterior eighth rib. Similar T3 compression deformity. Increased diffuse body wall edema. Continued progression of cutaneous thickening and soft tissue lesions involving the right anterior chest wall extending into the right upper anterior abdominal wall. Previously measured lesion along the medial upper right chest wall measures 3.7 x 1.5 cm (4:66), previously 3.1 x 2.5 cm. Review of the MIP images confirms the above findings. IMPRESSION: 1. Multifocal bilateral pulmonary emboli extending from distal lobar arteries into all lobes. Evidence of right heart strain. 2. New ground-glass opacity in the medial left apex and peribronchovascular left lower lobe, which may be infectious/inflammatory or secondary to pulmonary infarct. 3. Unchanged right pleural effusion with a loculated component. 4. Continued progression of cutaneous thickening and soft tissue lesions involving the right anterior chest wall extending into the right upper anterior abdominal wall. No drainable fluid collection. No gaseous component. 5. Right hilar lymphadenopathy, indeterminate. 6. Unchanged sclerotic appearance of the sternum and right anterior eighth rib, suspicious for osseous metastatic disease. 7. Increased diffuse body wall edema. Critical Value/emergent results were called by telephone at the time of interpretation on 06/10/2023 at 3:35 pm to provider DAVID ORTIZ , who verbally acknowledged these results. Electronically Signed   By: Agustin Cree M.D.   On: 06/23/2023 15:35   DG Chest Port 1 View  Result Date: 06/09/2023 CLINICAL DATA:  Sepsis.   Metastatic breast carcinoma EXAM: PORTABLE CHEST 1 VIEW COMPARISON:  05/19/2023 FINDINGS: Stable mild cardiomegaly. Small layering right pleural effusion shows no significant change. Infiltrate or atelectasis in the right lower lung also unchanged. IMPRESSION: Stable small layering right pleural effusion and right lower lung infiltrate or atelectasis. Electronically Signed   By: Danae Orleans M.D.   On: 06/14/2023 13:32   VAS [place] LOWER EXTREMITY VENOUS (DVT)  Result Date: 05/29/2023  Lower Venous DVT Study Patient Name:  MILAYAH HESSELTINE  Date of Exam:   05/28/2023 Medical Rec #: [id number]        Accession #:    [id number] Date of Birth: 09-27-1961         Patient Gender: F Patient Age:   24 years Exam Location:  [facility] Procedure:      VAS [place] LOWER EXTREMITY VENOUS (DVT) Referring Phys: Jon Billings REGALADO --------------------------------------------------------------------------------  Indications: Swelling.  Risk Factors: Cancer. Limitations: Poor ultrasound/tissue interface. Comparison Study: 05/20/2023 - RIGHT:                   - There is no evidence of deep vein thrombosis in the lower                   extremity.                    - No cystic structure found in the popliteal fossa.                    LEFT:                   - There  is no evidence of deep vein thrombosis in the lower                   extremity.                    - No cystic structure found in the popliteal fossa. Performing Technologist: Chanda Busing RVT  Examination Guidelines: A complete evaluation includes B-mode imaging, spectral Doppler, color Doppler, and power Doppler as needed of all accessible portions of each vessel. Bilateral testing is considered an integral part of a complete examination. Limited examinations for reoccurring indications may be performed as noted. The reflux portion of the exam is performed with the patient in reverse Trendelenburg.   +---------+---------------+---------+-----------+----------+--------------+ RIGHT    CompressibilityPhasicitySpontaneityPropertiesThrombus Aging +---------+---------------+---------+-----------+----------+--------------+ CFV      Full           Yes      Yes                                 +---------+---------------+---------+-----------+----------+--------------+ SFJ      Full                                                        +---------+---------------+---------+-----------+----------+--------------+ FV Prox  Full                                                        +---------+---------------+---------+-----------+----------+--------------+ FV Mid   Full                                                        +---------+---------------+---------+-----------+----------+--------------+ FV DistalFull                                                        +---------+---------------+---------+-----------+----------+--------------+ PFV      Full                                                        +---------+---------------+---------+-----------+----------+--------------+ POP      Full           Yes      Yes                                 +---------+---------------+---------+-----------+----------+--------------+ PTV      Full                                                        +---------+---------------+---------+-----------+----------+--------------+  PERO     Full                                                        +---------+---------------+---------+-----------+----------+--------------+   +----+---------------+---------+-----------+----------+--------------+ LEFTCompressibilityPhasicitySpontaneityPropertiesThrombus Aging +----+---------------+---------+-----------+----------+--------------+ CFV Full           Yes      Yes                                  +----+---------------+---------+-----------+----------+--------------+     Summary: RIGHT: - There is no evidence of deep vein thrombosis in the lower extremity.  - No cystic structure found in the popliteal fossa.  LEFT: - No evidence of common femoral vein obstruction.   *See table(s) above for measurements and observations. Electronically signed by Gerarda Fraction on 05/29/2023 at 9:22:17 AM.    Final     Microbiology: Recent Results (from the past 240 hour(s))  Blood Culture (routine x 2)     Status: None   Collection Time: 07-17-23 12:16 PM   Specimen: Site Not Specified; Blood  Result Value Ref Range Status   Specimen Description   Final    SITE NOT SPECIFIED Performed at [facility], [address] [place], [place] [address]    Special Requests   Final    BOTTLES DRAWN AEROBIC AND ANAEROBIC Blood Culture adequate volume Performed at [facility], [address] [place], [place] [address]    Culture   Final    NO GROWTH 5 DAYS Performed at [facility], [address] [place], [place] [address]    Report Status 06/28/2023 FINAL  Final  Blood Culture (routine x 2)     Status: None   Collection Time: Jul 17, 2023 12:35 PM   Specimen: BLOOD LEFT HAND  Result Value Ref Range Status   Specimen Description   Final    BLOOD LEFT HAND Performed at [facility], [address] [place], [place] [address]    Special Requests   Final    BOTTLES DRAWN AEROBIC ONLY Blood Culture adequate volume Performed at [facility], [address] [place], [place] [address]    Culture   Final    NO GROWTH 5 DAYS Performed at [facility], [address], [place], [place] [address]    Report Status 06/29/2023 FINAL  Final  Resp panel by RT-PCR (RSV, Flu A&B, Covid) Anterior Nasal Swab     Status: None   Collection Time: 07-17-23  1:15 PM   Specimen: Anterior Nasal Swab  Result Value Ref Range Status   SARS  Coronavirus 2 by RT PCR NEGATIVE NEGATIVE Final    Comment: (NOTE) SARS-CoV-2 target nucleic acids are NOT DETECTED.  The SARS-CoV-2 RNA is generally detectable in upper respiratory specimens during the acute phase of infection. The lowest concentration of SARS-CoV-2 viral copies this assay can detect is 138 copies/mL. A negative result does not preclude SARS-Cov-2 infection and should not be used as the sole basis for treatment or other patient management decisions. A negative result may occur with  improper specimen collection/handling, submission of specimen other than nasopharyngeal swab, presence of viral mutation(s) within the areas targeted by this assay, and inadequate number of viral copies(<138 copies/mL). A  negative result must be combined with clinical observations, patient history, and epidemiological information. The expected result is Negative.  Fact Sheet for Patients:  BloggerCourse.com  Fact Sheet for Healthcare Providers:  SeriousBroker.it  This test is no t yet approved or cleared by the [place] FDA and  has been authorized for detection and/or diagnosis of SARS-CoV-2 by FDA under an Emergency Use Authorization (EUA). This EUA will remain  in effect (meaning this test can be used) for the duration of the COVID-19 declaration under Section 564(b)(1) of the Act, 21 U.S.C.section 360bbb-3(b)(1), unless the authorization is terminated  or revoked sooner.       Influenza A by PCR NEGATIVE NEGATIVE Final   Influenza B by PCR NEGATIVE NEGATIVE Final    Comment: (NOTE) The Xpert Xpress SARS-CoV-2/FLU/RSV plus assay is intended as an aid in the diagnosis of influenza from Nasopharyngeal swab specimens and should not be used as a sole basis for treatment. Nasal washings and aspirates are unacceptable for Xpert Xpress SARS-CoV-2/FLU/RSV testing.  Fact Sheet for  Patients: BloggerCourse.com  Fact Sheet for Healthcare Providers: SeriousBroker.it  This test is not yet approved or cleared by the [place] FDA and has been authorized for detection and/or diagnosis of SARS-CoV-2 by FDA under an Emergency Use Authorization (EUA). This EUA will remain in effect (meaning this test can be used) for the duration of the COVID-19 declaration under Section 564(b)(1) of the Act, 21 U.S.C. section 360bbb-3(b)(1), unless the authorization is terminated or revoked.     Resp Syncytial Virus by PCR NEGATIVE NEGATIVE Final    Comment: (NOTE) Fact Sheet for Patients: BloggerCourse.com  Fact Sheet for Healthcare Providers: SeriousBroker.it  This test is not yet approved or cleared by the [place] FDA and has been authorized for detection and/or diagnosis of SARS-CoV-2 by FDA under an Emergency Use Authorization (EUA). This EUA will remain in effect (meaning this test can be used) for the duration of the COVID-19 declaration under Section 564(b)(1) of the Act, 21 U.S.C. section 360bbb-3(b)(1), unless the authorization is terminated or revoked.  Performed at [facility], [address], [place], [place] [address]     Time spent: 40 minutes  Signed: Ramiro Harvest, MD 2023/07/20

## 2023-06-30 NOTE — Progress Notes (Signed)
Patient's mom at bedside

## 2023-06-30 NOTE — Progress Notes (Signed)
Received return phone call from sister Stanton Kidney stating that mother should be arriving around 0830 this morning.

## 2023-06-30 NOTE — Progress Notes (Signed)
Attempted to notify mother Ashok Cordia [phone]; [phone]) and sister Klair Leising [phone]) unsuccessfully. Will attempt again later.

## 2023-06-30 NOTE — Progress Notes (Signed)
Patient Name: [name]           DOB: Apr 19, 1961  MRN: [id number]       OVERNIGHT EVENT    Notified by RN that patient has expired at 0347.  2 RN verified. Patient was comfort care.   Family has been notified by RN.   Anthoney Harada, DNP, ACNPC- AG [facility]

## 2023-06-30 NOTE — Progress Notes (Signed)
Verified absence of heartbeat and absence of respirations by myself and Malachi Carl, RN. Charge nurse Tamika informed. Abigail Chavez night coverage notified.

## 2023-06-30 NOTE — Progress Notes (Signed)
Wasted 45ml dilaudid drip; witnessed per charge rn tamika landon. Pharmacy notified.

## 2023-06-30 NOTE — Progress Notes (Signed)
Charge nurse attempted to contact both mother and sister again no answer. Will try again before end of shift.

## 2023-06-30 DEATH — deceased

## 2024-10-24 ENCOUNTER — Other Ambulatory Visit: Payer: Self-pay
